# Patient Record
Sex: Female | Born: 1985 | Race: White | Hispanic: No | Marital: Single | Smoking: Current every day smoker
Health system: Southern US, Community
[De-identification: ages and names within clinical notes are randomized; demographics above are authoritative.]

## PROBLEM LIST (undated history)

## (undated) ENCOUNTER — Ambulatory Visit: Admission: EM | Payer: Medicaid Other | Source: Home / Self Care

## (undated) DIAGNOSIS — K8020 Calculus of gallbladder without cholecystitis without obstruction: Secondary | ICD-10-CM

## (undated) DIAGNOSIS — F1910 Other psychoactive substance abuse, uncomplicated: Secondary | ICD-10-CM

## (undated) DIAGNOSIS — F419 Anxiety disorder, unspecified: Secondary | ICD-10-CM

## (undated) DIAGNOSIS — E119 Type 2 diabetes mellitus without complications: Secondary | ICD-10-CM

## (undated) DIAGNOSIS — F32A Depression, unspecified: Secondary | ICD-10-CM

## (undated) DIAGNOSIS — R002 Palpitations: Secondary | ICD-10-CM

## (undated) DIAGNOSIS — K219 Gastro-esophageal reflux disease without esophagitis: Secondary | ICD-10-CM

## (undated) DIAGNOSIS — F1010 Alcohol abuse, uncomplicated: Secondary | ICD-10-CM

## (undated) DIAGNOSIS — I517 Cardiomegaly: Secondary | ICD-10-CM

## (undated) DIAGNOSIS — B1920 Unspecified viral hepatitis C without hepatic coma: Secondary | ICD-10-CM

## (undated) DIAGNOSIS — F329 Major depressive disorder, single episode, unspecified: Secondary | ICD-10-CM

## (undated) DIAGNOSIS — K863 Pseudocyst of pancreas: Secondary | ICD-10-CM

## (undated) HISTORY — DX: Type 2 diabetes mellitus without complications: E11.9

## (undated) HISTORY — DX: Anxiety disorder, unspecified: F41.9

## (undated) HISTORY — DX: Cardiomegaly: I51.7

## (undated) HISTORY — DX: Calculus of gallbladder without cholecystitis without obstruction: K80.20

## (undated) HISTORY — DX: Depression, unspecified: F32.A

## (undated) HISTORY — PX: VASCULAR SURGERY: SHX849

## (undated) HISTORY — PX: CHOLECYSTECTOMY: SHX55

## (undated) HISTORY — DX: Major depressive disorder, single episode, unspecified: F32.9

## (undated) HISTORY — DX: Other psychoactive substance abuse, uncomplicated: F19.10

## (undated) HISTORY — DX: Pseudocyst of pancreas: K86.3

## (undated) HISTORY — DX: Alcohol abuse, uncomplicated: F10.10

## (undated) HISTORY — DX: Morbid (severe) obesity due to excess calories: E66.01

---

## 2005-08-19 ENCOUNTER — Emergency Department: Payer: Self-pay | Admitting: General Practice

## 2006-12-15 ENCOUNTER — Emergency Department: Payer: Self-pay | Admitting: Emergency Medicine

## 2007-02-08 ENCOUNTER — Emergency Department: Payer: Self-pay | Admitting: Emergency Medicine

## 2007-02-09 ENCOUNTER — Emergency Department: Payer: Self-pay | Admitting: Emergency Medicine

## 2007-06-14 ENCOUNTER — Emergency Department: Payer: Self-pay | Admitting: Emergency Medicine

## 2008-02-13 ENCOUNTER — Emergency Department: Payer: Self-pay | Admitting: Emergency Medicine

## 2009-02-10 ENCOUNTER — Emergency Department: Payer: Self-pay | Admitting: Emergency Medicine

## 2009-07-07 DIAGNOSIS — B182 Chronic viral hepatitis C: Secondary | ICD-10-CM

## 2009-12-07 DIAGNOSIS — K8020 Calculus of gallbladder without cholecystitis without obstruction: Secondary | ICD-10-CM

## 2009-12-07 HISTORY — DX: Calculus of gallbladder without cholecystitis without obstruction: K80.20

## 2014-10-15 LAB — HEPATIC FUNCTION PANEL
ALT: 17 U/L (ref 7–35)
AST: 22 U/L (ref 13–35)
Alkaline Phosphatase: 70 U/L (ref 25–125)
Bilirubin, Total: 0.6 mg/dL

## 2014-10-15 LAB — BASIC METABOLIC PANEL
BUN: 8 mg/dL (ref 4–21)
Creatinine: 0.7 mg/dL (ref 0.5–1.1)
Glucose: 90 mg/dL
POTASSIUM: 4.2 mmol/L (ref 3.4–5.3)
SODIUM: 138 mmol/L (ref 137–147)

## 2014-10-15 LAB — LIPID PANEL
CHOLESTEROL: 171 mg/dL (ref 0–200)
HDL: 30 mg/dL — AB (ref 35–70)
LDL CALC: 84 mg/dL
Triglycerides: 286 mg/dL — AB (ref 40–160)

## 2014-10-15 LAB — CBC AND DIFFERENTIAL
HEMATOCRIT: 44 % (ref 36–46)
HEMOGLOBIN: 14.5 g/dL (ref 12.0–16.0)
PLATELETS: 241 10*3/uL (ref 150–399)
WBC: 8.8 10^3/mL

## 2014-10-15 LAB — HM PAP SMEAR: HM Pap smear: NEGATIVE

## 2014-10-15 LAB — TSH: TSH: 1.16 u[IU]/mL (ref <=5.90)

## 2014-12-18 DIAGNOSIS — Y998 Other external cause status: Secondary | ICD-10-CM | POA: Diagnosis not present

## 2014-12-18 DIAGNOSIS — Y92410 Unspecified street and highway as the place of occurrence of the external cause: Secondary | ICD-10-CM | POA: Diagnosis not present

## 2014-12-18 DIAGNOSIS — Z720 Tobacco use: Secondary | ICD-10-CM | POA: Insufficient documentation

## 2014-12-18 DIAGNOSIS — Z79899 Other long term (current) drug therapy: Secondary | ICD-10-CM | POA: Insufficient documentation

## 2014-12-18 DIAGNOSIS — Y9389 Activity, other specified: Secondary | ICD-10-CM | POA: Insufficient documentation

## 2014-12-18 DIAGNOSIS — S40012A Contusion of left shoulder, initial encounter: Secondary | ICD-10-CM | POA: Insufficient documentation

## 2014-12-18 DIAGNOSIS — Z880 Allergy status to penicillin: Secondary | ICD-10-CM | POA: Insufficient documentation

## 2014-12-18 DIAGNOSIS — S20229A Contusion of unspecified back wall of thorax, initial encounter: Secondary | ICD-10-CM | POA: Diagnosis not present

## 2014-12-18 DIAGNOSIS — S29012A Strain of muscle and tendon of back wall of thorax, initial encounter: Secondary | ICD-10-CM | POA: Insufficient documentation

## 2014-12-18 DIAGNOSIS — S299XXA Unspecified injury of thorax, initial encounter: Secondary | ICD-10-CM | POA: Diagnosis present

## 2014-12-19 ENCOUNTER — Encounter: Payer: Self-pay | Admitting: *Deleted

## 2014-12-19 ENCOUNTER — Emergency Department
Admission: EM | Admit: 2014-12-19 | Discharge: 2014-12-19 | Disposition: A | Discharge status: Home / Self Care | Payer: Medicaid Other | Attending: Emergency Medicine | Admitting: Emergency Medicine

## 2014-12-19 ENCOUNTER — Emergency Department: Payer: Medicaid Other

## 2014-12-19 DIAGNOSIS — R0789 Other chest pain: Secondary | ICD-10-CM

## 2014-12-19 DIAGNOSIS — S29019A Strain of muscle and tendon of unspecified wall of thorax, initial encounter: Secondary | ICD-10-CM

## 2014-12-19 HISTORY — DX: Unspecified viral hepatitis C without hepatic coma: B19.20

## 2014-12-19 MED ORDER — DIAZEPAM 2 MG PO TABS: 2.0000 mg | ORAL_TABLET | Freq: Three times a day (TID) | ORAL | Status: DC | PRN

## 2014-12-19 MED ORDER — OXYCODONE-ACETAMINOPHEN 5-325 MG PO TABS
1.0000 | ORAL_TABLET | Freq: Once | ORAL | Status: AC
  Administered 2014-12-19: 1 via ORAL
  Filled 2014-12-19: qty 1

## 2014-12-19 MED ORDER — IBUPROFEN 800 MG PO TABS: 800.0000 mg | ORAL_TABLET | Freq: Three times a day (TID) | ORAL | Status: DC | PRN
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
## 2015-01-21 ENCOUNTER — Encounter: Payer: Self-pay | Admitting: Emergency Medicine

## 2015-01-21 ENCOUNTER — Ambulatory Visit
Admission: EM | Admit: 2015-01-21 | Discharge: 2015-01-21 | Disposition: A | Discharge status: Home / Self Care | Payer: Medicaid Other | Attending: Family Medicine | Admitting: Family Medicine

## 2015-01-21 DIAGNOSIS — J0100 Acute maxillary sinusitis, unspecified: Secondary | ICD-10-CM

## 2015-01-21 MED ORDER — DOXYCYCLINE HYCLATE 100 MG PO CAPS: 100.0000 mg | ORAL_CAPSULE | Freq: Two times a day (BID) | ORAL | Status: DC

## 2015-01-21 NOTE — ED Provider Notes (Signed)
CSN: [id number]     Arrival date & time 01/21/15  1704 History   First MD Initiated Contact with Patient 01/21/15 1726     Chief Complaint  Patient presents with  . Facial Pain   (Consider location/radiation/quality/duration/timing/severity/associated sxs/prior Treatment) HPI 29 year old female presents with complaints of facial pain, sinus congestion/chest congestion.  Patient reports that she has had the above symptoms for the past 2.5 weeks. She notes that her son has been sick as well. She reports associated cough which is mildly productive. No exacerbating or relieving factors. No associated fever, chills. She does note some SOB (she is a smoker).    Past Medical History  Diagnosis Date  . Hepatitis C    Past Surgical History  Procedure Laterality Date  . Cholecystectomy     No pertinent family history.  Social History  Substance Use Topics  . Smoking status: Current Every Day Smoker  . Smokeless tobacco: None  . Alcohol Use: No   OB History    No data available     Review of Systems  Constitutional: Negative for fever.  HENT: Positive for congestion, sinus pressure and sore throat.   Respiratory: Positive for cough.   All other systems negative.    Allergies  Penicillins  Home Medications   Prior to Admission medications   Medication Sig Start Date End Date Taking? Authorizing Provider  buPROPion (WELLBUTRIN) 100 MG tablet Take 200 mg by mouth daily.    Historical Provider, MD  diazepam (VALIUM) 2 MG tablet Take 1 tablet (2 mg total) by mouth every 8 (eight) hours as needed for anxiety or muscle spasms. 12/19/14   Ahmed Prima, MD  ibuprofen (ADVIL,MOTRIN) 800 MG tablet Take 1 tablet (800 mg total) by mouth every 8 (eight) hours as needed. 12/19/14   Ahmed Prima, MD   Meds Ordered and Administered this Visit  Medications - No data to display  BP 103/52 mmHg  Pulse 85  Temp(Src) 97.1 F (36.2 C) (Tympanic)  Resp 16  Ht 5\' 3"  (1.6 m)  Wt 200 lb  (90.719 kg)  BMI 35.44 kg/m2  SpO2 98%  LMP 12/29/2014 No data found.  Physical Exam  Constitutional: She is oriented to person, place, and time. She appears well-developed and well-nourished.  Appears fatigued.   HENT:  Head: Normocephalic and atraumatic.  Mouth/Throat: Oropharynx is clear and moist.  Normal TM's bilaterally.  Maxillary sinuses mildly tender to palpation.  Neck: No tracheal deviation present.  Cardiovascular: Normal rate and regular rhythm.   No murmur heard. Pulmonary/Chest: Effort normal and breath sounds normal. No respiratory distress. She has no wheezes. She has no rales.  Abdominal: Soft. She exhibits no distension. There is no tenderness. There is no rebound.  Musculoskeletal: Normal range of motion.  Neurological: She is alert and oriented to person, place, and time.  Skin: Skin is warm and dry.  Psychiatric:  Flat affect.   Vitals reviewed.  ED Course  Procedures (including critical care time)  Labs Review Labs Reviewed - No data to display  Imaging Review No results found.  MDM   1. Acute maxillary sinusitis, recurrence not specified    29 year old female with acute sinusitis. Nontoxic appear, VSS. Treating with Doxycyline.  Follow up with PCP if symptoms worsen or fail to improve.     Coral Spikes, DO 01/21/15 1753

## 2015-01-21 NOTE — ED Notes (Signed)
Sinus, congestion, scratchy throat for 2 weeks

## 2015-01-21 NOTE — Discharge Instructions (Signed)

## 2015-02-25 DIAGNOSIS — F329 Major depressive disorder, single episode, unspecified: Secondary | ICD-10-CM | POA: Insufficient documentation

## 2015-02-25 DIAGNOSIS — F419 Anxiety disorder, unspecified: Secondary | ICD-10-CM | POA: Insufficient documentation

## 2015-02-25 DIAGNOSIS — F339 Major depressive disorder, recurrent, unspecified: Secondary | ICD-10-CM | POA: Insufficient documentation

## 2015-09-29 ENCOUNTER — Other Ambulatory Visit: Payer: Self-pay

## 2015-09-30 ENCOUNTER — Encounter: Payer: Self-pay | Admitting: Gastroenterology

## 2015-09-30 ENCOUNTER — Encounter (INDEPENDENT_AMBULATORY_CARE_PROVIDER_SITE_OTHER): Payer: Self-pay

## 2015-09-30 ENCOUNTER — Ambulatory Visit (INDEPENDENT_AMBULATORY_CARE_PROVIDER_SITE_OTHER): Payer: Medicaid Other | Admitting: Gastroenterology

## 2015-09-30 VITALS — BP 137/69 | HR 88 | Temp 98.7°F | Ht 63.0 in | Wt 210.0 lb

## 2015-09-30 DIAGNOSIS — B182 Chronic viral hepatitis C: Secondary | ICD-10-CM

## 2015-09-30 NOTE — Progress Notes (Signed)
Gastroenterology Consultation  Referring Provider:     [facility] [place] Primary Primary Care Physician:  [facility] PR[facility] Primary Gastroenterologist:  Dr. Allen Norris     Reason for Consultation:     Hepatitis C        HPI:   Katie Stanley is a 30 y.o. y/o female referred for consultation & management of Hepatitis C by Dr. Shari Prows PRIMARY CARE.  This patient comes today with a report of being told she had hepatitis C.  Her boyfriend is being treated at the present time by me for his hepatitis C.  The patient has not used any IV drugs for the last 4 years.  She also had blood work done late 2016 that showed her viral load to be negative.  She denies any new high risk activities except sexual contact with her boyfriend who is hepatitis C positive.  There is no report of any nausea vomiting fevers chills black stools or bloody stools.  Past Medical History  Diagnosis Date  . Hepatitis C   . Anxiety   . Depression   . Cholelithiasis   . Substance abuse     Past Surgical History  Procedure Laterality Date  . Cholecystectomy      Prior to Admission medications   Medication Sig Start Date End Date Taking? Authorizing Provider  citalopram (CELEXA) 20 MG tablet Take 20 mg by mouth. 07/08/15 07/07/16 Yes Historical Provider, MD  clotrimazole (LOTRIMIN) 1 % cream Apply topically. 05/19/15 05/18/16 Yes Historical Provider, MD  cyclobenzaprine (FLEXERIL) 10 MG tablet  09/02/15  Yes Historical Provider, MD  ibuprofen (ADVIL,MOTRIN) 800 MG tablet Take 1 tablet (800 mg total) by mouth every 8 (eight) hours as needed. 12/19/14  Yes Ahmed Prima, MD  propranolol (INDERAL) 10 MG tablet  06/21/15  Yes Historical Provider, MD  buPROPion (WELLBUTRIN) 100 MG tablet Take 200 mg by mouth daily. Reported on 09/30/2015    Historical Provider, MD  diazepam (VALIUM) 2 MG tablet Take 1 tablet (2 mg total) by mouth every 8 (eight) hours as needed for anxiety or muscle spasms. Patient not taking: Reported on 09/30/2015  12/19/14   Ahmed Prima, MD  doxycycline (VIBRAMYCIN) 100 MG capsule Take 1 capsule (100 mg total) by mouth 2 (two) times daily. Patient not taking: Reported on 09/30/2015 01/21/15   Coral Spikes, DO    Family History  Problem Relation Age of Onset  . Hypertension Mother      Social History  Substance Use Topics  . Smoking status: Current Every Day Smoker  . Smokeless tobacco: Never Used  . Alcohol Use: No    Allergies as of 09/30/2015 - Review Complete 09/30/2015  Allergen Reaction Noted  . Penicillins Swelling 12/19/2014    Review of Systems:    All systems reviewed and negative except where noted in HPI.   Physical Exam:  BP 137/69 mmHg  Pulse 88  Temp(Src) 98.7 F (37.1 C) (Oral)  Ht 5\' 3"  (1.6 m)  Wt 210 lb (95.255 kg)  BMI 37.21 kg/m2 No LMP recorded. Psych:  Alert and cooperative. Normal mood and affect. General:   Alert,  Well-developed, well-nourished, pleasant and cooperative in NAD Head:  Normocephalic and atraumatic. Eyes:  Sclera clear, no icterus.   Conjunctiva pink. Ears:  Normal auditory acuity. Nose:  No deformity, discharge, or lesions. Mouth:  No deformity or lesions,oropharynx pink & moist. Neck:  Supple; no masses or thyromegaly. Lungs:  Respirations even and unlabored.  Clear throughout to  auscultation.   No wheezes, crackles, or rhonchi. No acute distress. Heart:  Regular rate and rhythm; no murmurs, clicks, rubs, or gallops. Abdomen:  Normal bowel sounds.  No bruits.  Soft, non-tender and non-distended without masses, hepatosplenomegaly or hernias noted.  No guarding or rebound tenderness.  Negative Carnett sign.   Rectal:  Deferred.  Msk:  Symmetrical without gross deformities.  Good, equal movement & strength bilaterally. Pulses:  Normal pulses noted. Extremities:  No clubbing or edema.  No cyanosis. Neurologic:  Alert and oriented x3;  grossly normal neurologically. Skin:  Intact without significant lesions or rashes.  No jaundice. Lymph  Nodes:  No significant cervical adenopathy. Psych:  Alert and cooperative. Normal mood and affect.  Imaging Studies: No results found.  Assessment and Plan:   Katie Stanley is a 30 y.o. y/o female Who comes in today with a history of having hepatitis C.  The patient's most recent viral load was negative.  The patient will have her antibody and viral load checked again.  She will also have her LFTs checked.  The patient has been explained the plan and agrees with it.   Note: This dictation was prepared with Dragon dictation along with smaller phrase technology. Any transcriptional errors that result from this process are unintentional.

## 2015-11-25 ENCOUNTER — Encounter: Payer: Self-pay | Admitting: Family Medicine

## 2015-11-25 ENCOUNTER — Ambulatory Visit (INDEPENDENT_AMBULATORY_CARE_PROVIDER_SITE_OTHER): Payer: Medicaid Other | Admitting: Family Medicine

## 2015-11-25 VITALS — BP 130/75 | HR 73 | Temp 98.0°F | Wt 211.0 lb

## 2015-11-25 DIAGNOSIS — F419 Anxiety disorder, unspecified: Secondary | ICD-10-CM | POA: Diagnosis not present

## 2015-11-25 DIAGNOSIS — L0390 Cellulitis, unspecified: Secondary | ICD-10-CM

## 2015-11-25 DIAGNOSIS — L559 Sunburn, unspecified: Secondary | ICD-10-CM

## 2015-11-25 MED ORDER — SILVER SULFADIAZINE 1 % EX CREA: 1.0000 "application " | TOPICAL_CREAM | Freq: Every day | CUTANEOUS | 1 refills | Status: DC

## 2015-11-25 MED ORDER — PREDNISONE 10 MG PO TABS: ORAL_TABLET | ORAL | 0 refills | Status: DC

## 2015-11-25 MED ORDER — SULFAMETHOXAZOLE-TRIMETHOPRIM 800-160 MG PO TABS: 1.0000 | ORAL_TABLET | Freq: Two times a day (BID) | ORAL | 0 refills | Status: DC
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
## 2015-11-25 NOTE — Assessment & Plan Note (Signed)
Not happy with her treatment. Not seeing psychiatry. Will get records from previous provider and psychiatrist and discuss further upon review.

## 2015-11-25 NOTE — Patient Instructions (Addendum)
Sunburn  Sunburn is skin damage from being out in the sun too long. If you have light or fair skin, you may get sunburned more easily. Getting sunburned over and over can cause wrinkles and dark spots on the skin (sun spots). It can also increase your chance of getting skin cancer. HOME CARE  Avoid being out in the sun until your sunburn is gone.  Take a cool bath to help lessen pain. Put a cold, damp washcloth on the sunburn to help lessen pain. Do not put ice on the sunburn.  Only take medicine as told by your doctor.  Use sunburn creams or gels on your skin but not on blisters.  Drink enough fluids to keep your pee (urine) clear or pale yellow.  Do not break blisters. If blisters break, your doctor may tell you to use a medicated cream on the area. To keep from getting sunburned:  Avoid the sun between 10:00 a.m. and 4:00 p.m. during the day.  Put sunscreen on 30 minutes before being in the sun.  Wear a hat, clothing, and sunglasses to protect against the sun.  Avoid medicines, herbs, and foods that make you more sensitive to sun.  Avoid tanning beds. GET HELP RIGHT AWAY IF:  You have a fever.  You have pain and medicine does not help.  You throw up (vomit) or have watery poop (diarrhea).  You feel like you will pass out (faint).  You have a headache and feel confused.  You have very bad blisters.  You have yellowish-white fluid (pus) coming from your blisters.  Your burn gets more painful and puffy (swollen). MAKE SURE YOU:  Understand these instructions.  Will watch your condition.  Will get help right away if you are not doing well or get worse.   This information is not intended to replace advice given to you by your health care provider. Make sure you discuss any questions you have with your health care provider.   Document Released: 12/07/2010 Document Revised: 07/22/2012 Document Reviewed: 09/28/2014 Elsevier Interactive Patient Education [employer].

## 2015-12-02 ENCOUNTER — Ambulatory Visit (INDEPENDENT_AMBULATORY_CARE_PROVIDER_SITE_OTHER): Payer: Medicaid Other | Admitting: Family Medicine

## 2015-12-02 ENCOUNTER — Other Ambulatory Visit: Payer: Self-pay | Admitting: Family Medicine

## 2015-12-02 ENCOUNTER — Encounter: Payer: Self-pay | Admitting: Family Medicine

## 2015-12-02 VITALS — BP 111/71 | HR 72 | Temp 98.4°F | Wt 210.0 lb

## 2015-12-02 DIAGNOSIS — D229 Melanocytic nevi, unspecified: Secondary | ICD-10-CM

## 2015-12-02 DIAGNOSIS — L559 Sunburn, unspecified: Secondary | ICD-10-CM

## 2015-12-02 DIAGNOSIS — M5441 Lumbago with sciatica, right side: Secondary | ICD-10-CM

## 2015-12-02 MED ORDER — CYCLOBENZAPRINE HCL 10 MG PO TABS: 10.0000 mg | ORAL_TABLET | Freq: Every day | ORAL | 1 refills | Status: DC

## 2015-12-02 MED ORDER — NAPROXEN 500 MG PO TABS: 500.0000 mg | ORAL_TABLET | Freq: Two times a day (BID) | ORAL | 1 refills | Status: DC
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
## 2015-12-02 NOTE — Patient Instructions (Addendum)
Sciatica With Rehab The sciatic nerve runs from the back down the leg and is responsible for sensation and control of the muscles in the back (posterior) side of the thigh, lower leg, and foot. Sciatica is a condition that is characterized by inflammation of this nerve.  SYMPTOMS   Signs of nerve damage, including numbness and/or weakness along the posterior side of the lower extremity.  Pain in the back of the thigh that may also travel down the leg.  Pain that worsens when sitting for long periods of time.  Occasionally, pain in the back or buttock. CAUSES  Inflammation of the sciatic nerve is the cause of sciatica. The inflammation is due to something irritating the nerve. Common sources of irritation include:  Sitting for long periods of time.  Direct trauma to the nerve.  Arthritis of the spine.  Herniated or ruptured disk.  Slipping of the vertebrae (spondylolisthesis).  Pressure from soft tissues, such as muscles or ligament-like tissue (fascia). RISK INCREASES WITH:  Sports that place pressure or stress on the spine (football or weightlifting).  Poor strength and flexibility.  Failure to warm up properly before activity.  Family history of low back pain or disk disorders.  Previous back injury or surgery.  Poor body mechanics, especially when lifting, or poor posture. PREVENTION   Warm up and stretch properly before activity.  Maintain physical fitness:  Strength, flexibility, and endurance.  Cardiovascular fitness.  Learn and use proper technique, especially with posture and lifting. When possible, have coach correct improper technique.  Avoid activities that place stress on the spine. PROGNOSIS If treated properly, then sciatica usually resolves within 6 weeks. However, occasionally surgery is necessary.  RELATED COMPLICATIONS   Permanent nerve damage, including pain, numbness, tingle, or weakness.  Chronic back pain.  Risks of surgery: infection,  bleeding, nerve damage, or damage to surrounding tissues. TREATMENT Treatment initially involves resting from any activities that aggravate your symptoms. The use of ice and medication may help reduce pain and inflammation. The use of strengthening and stretching exercises may help reduce pain with activity. These exercises may be performed at home or with referral to a therapist. A therapist may recommend further treatments, such as transcutaneous electronic nerve stimulation (TENS) or ultrasound. Your caregiver may recommend corticosteroid injections to help reduce inflammation of the sciatic nerve. If symptoms persist despite non-surgical (conservative) treatment, then surgery may be recommended. MEDICATION  If pain medication is necessary, then nonsteroidal anti-inflammatory medications, such as aspirin and ibuprofen, or other minor pain relievers, such as acetaminophen, are often recommended.  Do not take pain medication for 7 days before surgery.  Prescription pain relievers may be given if deemed necessary by your caregiver. Use only as directed and only as much as you need.  Ointments applied to the skin may be helpful.  Corticosteroid injections may be given by your caregiver. These injections should be reserved for the most serious cases, because they may only be given a certain number of times. HEAT AND COLD  Cold treatment (icing) relieves pain and reduces inflammation. Cold treatment should be applied for 10 to 15 minutes every 2 to 3 hours for inflammation and pain and immediately after any activity that aggravates your symptoms. Use ice packs or massage the area with a piece of ice (ice massage).  Heat treatment may be used prior to performing the stretching and strengthening activities prescribed by your caregiver, physical therapist, or athletic trainer. Use a heat pack or soak the injury in warm water.   SEEK MEDICAL CARE IF:  Treatment seems to offer no benefit, or the condition  worsens.  Any medications produce adverse side effects. EXERCISES  RANGE OF MOTION (ROM) AND STRETCHING EXERCISES - Sciatica Most people with sciatic will find that their symptoms worsen with either excessive bending forward (flexion) or arching at the low back (extension). The exercises which will help resolve your symptoms will focus on the opposite motion. Your physician, physical therapist or athletic trainer will help you determine which exercises will be most helpful to resolve your low back pain. Do not complete any exercises without first consulting with your clinician. Discontinue any exercises which worsen your symptoms until you speak to your clinician. If you have pain, numbness or tingling which travels down into your buttocks, leg or foot, the goal of the therapy is for these symptoms to move closer to your back and eventually resolve. Occasionally, these leg symptoms will get better, but your low back pain may worsen; this is typically an indication of progress in your rehabilitation. Be certain to be very alert to any changes in your symptoms and the activities in which you participated in the 24 hours prior to the change. Sharing this information with your clinician will allow him/her to most efficiently treat your condition. These exercises may help you when beginning to rehabilitate your injury. Your symptoms may resolve with or without further involvement from your physician, physical therapist or athletic trainer. While completing these exercises, remember:   Restoring tissue flexibility helps normal motion to return to the joints. This allows healthier, less painful movement and activity.  An effective stretch should be held for at least 30 seconds.  A stretch should never be painful. You should only feel a gentle lengthening or release in the stretched tissue. FLEXION RANGE OF MOTION AND STRETCHING EXERCISES: STRETCH - Flexion, Single Knee to Chest   Lie on a firm bed or floor  with both legs extended in front of you.  Keeping one leg in contact with the floor, bring your opposite knee to your chest. Hold your leg in place by either grabbing behind your thigh or at your knee.  Pull until you feel a gentle stretch in your low back. Hold __________ seconds.  Slowly release your grasp and repeat the exercise with the opposite side. Repeat __________ times. Complete this exercise __________ times per day.  STRETCH - Flexion, Double Knee to Chest  Lie on a firm bed or floor with both legs extended in front of you.  Keeping one leg in contact with the floor, bring your opposite knee to your chest.  Tense your stomach muscles to support your back and then lift your other knee to your chest. Hold your legs in place by either grabbing behind your thighs or at your knees.  Pull both knees toward your chest until you feel a gentle stretch in your low back. Hold __________ seconds.  Tense your stomach muscles and slowly return one leg at a time to the floor. Repeat __________ times. Complete this exercise __________ times per day.  STRETCH - Low Trunk Rotation   Lie on a firm bed or floor. Keeping your legs in front of you, bend your knees so they are both pointed toward the ceiling and your feet are flat on the floor.  Extend your arms out to the side. This will stabilize your upper body by keeping your shoulders in contact with the floor.  Gently and slowly drop both knees together to one side until   you feel a gentle stretch in your low back. Hold for __________ seconds.  Tense your stomach muscles to support your low back as you bring your knees back to the starting position. Repeat the exercise to the other side. Repeat __________ times. Complete this exercise __________ times per day  EXTENSION RANGE OF MOTION AND FLEXIBILITY EXERCISES: STRETCH - Extension, Prone on Elbows  Lie on your stomach on the floor, a bed will be too soft. Place your palms about shoulder  width apart and at the height of your head.  Place your elbows under your shoulders. If this is too painful, stack pillows under your chest.  Allow your body to relax so that your hips drop lower and make contact more completely with the floor.  Hold this position for __________ seconds.  Slowly return to lying flat on the floor. Repeat __________ times. Complete this exercise __________ times per day.  RANGE OF MOTION - Extension, Prone Press Ups  Lie on your stomach on the floor, a bed will be too soft. Place your palms about shoulder width apart and at the height of your head.  Keeping your back as relaxed as possible, slowly straighten your elbows while keeping your hips on the floor. You may adjust the placement of your hands to maximize your comfort. As you gain motion, your hands will come more underneath your shoulders.  Hold this position __________ seconds.  Slowly return to lying flat on the floor. Repeat __________ times. Complete this exercise __________ times per day.  STRENGTHENING EXERCISES - Sciatica  These exercises may help you when beginning to rehabilitate your injury. These exercises should be done near your "sweet spot." This is the neutral, low-back arch, somewhere between fully rounded and fully arched, that is your least painful position. When performed in this safe range of motion, these exercises can be used for people who have either a flexion or extension based injury. These exercises may resolve your symptoms with or without further involvement from your physician, physical therapist or athletic trainer. While completing these exercises, remember:   Muscles can gain both the endurance and the strength needed for everyday activities through controlled exercises.  Complete these exercises as instructed by your physician, physical therapist or athletic trainer. Progress with the resistance and repetition exercises only as your caregiver advises.  You may  experience muscle soreness or fatigue, but the pain or discomfort you are trying to eliminate should never worsen during these exercises. If this pain does worsen, stop and make certain you are following the directions exactly. If the pain is still present after adjustments, discontinue the exercise until you can discuss the trouble with your clinician. STRENGTHENING - Deep Abdominals, Pelvic Tilt   Lie on a firm bed or floor. Keeping your legs in front of you, bend your knees so they are both pointed toward the ceiling and your feet are flat on the floor.  Tense your lower abdominal muscles to press your low back into the floor. This motion will rotate your pelvis so that your tail bone is scooping upwards rather than pointing at your feet or into the floor.  With a gentle tension and even breathing, hold this position for __________ seconds. Repeat __________ times. Complete this exercise __________ times per day.  STRENGTHENING - Abdominals, Crunches   Lie on a firm bed or floor. Keeping your legs in front of you, bend your knees so they are both pointed toward the ceiling and your feet are flat on the   floor. Cross your arms over your chest.  Slightly tip your chin down without bending your neck.  Tense your abdominals and slowly lift your trunk high enough to just clear your shoulder blades. Lifting higher can put excessive stress on the low back and does not further strengthen your abdominal muscles.  Control your return to the starting position. Repeat __________ times. Complete this exercise __________ times per day.  STRENGTHENING - Quadruped, Opposite UE/LE Lift  Assume a hands and knees position on a firm surface. Keep your hands under your shoulders and your knees under your hips. You may place padding under your knees for comfort.  Find your neutral spine and gently tense your abdominal muscles so that you can maintain this position. Your shoulders and hips should form a rectangle  that is parallel with the floor and is not twisted.  Keeping your trunk steady, lift your right hand no higher than your shoulder and then your left leg no higher than your hip. Make sure you are not holding your breath. Hold this position __________ seconds.  Continuing to keep your abdominal muscles tense and your back steady, slowly return to your starting position. Repeat with the opposite arm and leg. Repeat __________ times. Complete this exercise __________ times per day.  STRENGTHENING - Abdominals and Quadriceps, Straight Leg Raise   Lie on a firm bed or floor with both legs extended in front of you.  Keeping one leg in contact with the floor, bend the other knee so that your foot can rest flat on the floor.  Find your neutral spine, and tense your abdominal muscles to maintain your spinal position throughout the exercise.  Slowly lift your straight leg off the floor about 6 inches for a count of 15, making sure to not hold your breath.  Still keeping your neutral spine, slowly lower your leg all the way to the floor. Repeat this exercise with each leg __________ times. Complete this exercise __________ times per day. POSTURE AND BODY MECHANICS CONSIDERATIONS - Sciatica Keeping correct posture when sitting, standing or completing your activities will reduce the stress put on different body tissues, allowing injured tissues a chance to heal and limiting painful experiences. The following are general guidelines for improved posture. Your physician or physical therapist will provide you with any instructions specific to your needs. While reading these guidelines, remember:  The exercises prescribed by your provider will help you have the flexibility and strength to maintain correct postures.  The correct posture provides the optimal environment for your joints to work. All of your joints have less wear and tear when properly supported by a spine with good posture. This means you will  experience a healthier, less painful body.  Correct posture must be practiced with all of your activities, especially prolonged sitting and standing. Correct posture is as important when doing repetitive low-stress activities (typing) as it is when doing a single heavy-load activity (lifting). RESTING POSITIONS Consider which positions are most painful for you when choosing a resting position. If you have pain with flexion-based activities (sitting, bending, stooping, squatting), choose a position that allows you to rest in a less flexed posture. You would want to avoid curling into a fetal position on your side. If your pain worsens with extension-based activities (prolonged standing, working overhead), avoid resting in an extended position such as sleeping on your stomach. Most people will find more comfort when they rest with their spine in a more neutral position, neither too rounded nor too   arched. Lying on a non-sagging bed on your side with a pillow between your knees, or on your back with a pillow under your knees will often provide some relief. Keep in mind, being in any one position for a prolonged period of time, no matter how correct your posture, can still lead to stiffness. PROPER SITTING POSTURE In order to minimize stress and discomfort on your spine, you must sit with correct posture Sitting with good posture should be effortless for a healthy body. Returning to good posture is a gradual process. Many people can work toward this most comfortably by using various supports until they have the flexibility and strength to maintain this posture on their own. When sitting with proper posture, your ears will fall over your shoulders and your shoulders will fall over your hips. You should use the back of the chair to support your upper back. Your low back will be in a neutral position, just slightly arched. You may place a small pillow or folded towel at the base of your low back for support.  When  working at a desk, create an environment that supports good, upright posture. Without extra support, muscles fatigue and lead to excessive strain on joints and other tissues. Keep these recommendations in mind: CHAIR:   A chair should be able to slide under your desk when your back makes contact with the back of the chair. This allows you to work closely.  The chair's height should allow your eyes to be level with the upper part of your monitor and your hands to be slightly lower than your elbows. BODY POSITION  Your feet should make contact with the floor. If this is not possible, use a foot rest.  Keep your ears over your shoulders. This will reduce stress on your neck and low back. INCORRECT SITTING POSTURES   If you are feeling tired and unable to assume a healthy sitting posture, do not slouch or slump. This puts excessive strain on your back tissues, causing more damage and pain. Healthier options include:  Using more support, like a lumbar pillow.  Switching tasks to something that requires you to be upright or walking.  Talking a brief walk.  Lying down to rest in a neutral-spine position. PROLONGED STANDING WHILE SLIGHTLY LEANING FORWARD  When completing a task that requires you to lean forward while standing in one place for a long time, place either foot up on a stationary 2-4 inch high object to help maintain the best posture. When both feet are on the ground, the low back tends to lose its slight inward curve. If this curve flattens (or becomes too large), then the back and your other joints will experience too much stress, fatigue more quickly and can cause pain.  CORRECT STANDING POSTURES Proper standing posture should be assumed with all daily activities, even if they only take a few moments, like when brushing your teeth. As in sitting, your ears should fall over your shoulders and your shoulders should fall over your hips. You should keep a slight tension in your abdominal  muscles to brace your spine. Your tailbone should point down to the ground, not behind your body, resulting in an over-extended swayback posture.  INCORRECT STANDING POSTURES  Common incorrect standing postures include a forward head, locked knees and/or an excessive swayback. WALKING Walk with an upright posture. Your ears, shoulders and hips should all line-up. PROLONGED ACTIVITY IN A FLEXED POSITION When completing a task that requires you to bend forward   at your waist or lean over a low surface, try to find a way to stabilize 3 of 4 of your limbs. You can place a hand or elbow on your thigh or rest a knee on the surface you are reaching across. This will provide you more stability so that your muscles do not fatigue as quickly. By keeping your knees relaxed, or slightly bent, you will also reduce stress across your low back. CORRECT LIFTING TECHNIQUES DO :   Assume a wide stance. This will provide you more stability and the opportunity to get as close as possible to the object which you are lifting.  Tense your abdominals to brace your spine; then bend at the knees and hips. Keeping your back locked in a neutral-spine position, lift using your leg muscles. Lift with your legs, keeping your back straight.  Test the weight of unknown objects before attempting to lift them.  Try to keep your elbows locked down at your sides in order get the best strength from your shoulders when carrying an object.  Always ask for help when lifting heavy or awkward objects. INCORRECT LIFTING TECHNIQUES DO NOT:   Lock your knees when lifting, even if it is a small object.  Bend and twist. Pivot at your feet or move your feet when needing to change directions.  Assume that you cannot safely pick up a paperclip without proper posture.   This information is not intended to replace advice given to you by your health care provider. Make sure you discuss any questions you have with your health care provider.     Document Released: 03/27/2005 Document Revised: 08/11/2014 Document Reviewed: 07/09/2008 [employer] Interactive Patient Education 2016 [employer]  

## 2015-12-09 ENCOUNTER — Ambulatory Visit
Admission: EM | Admit: 2015-12-09 | Discharge: 2015-12-09 | Disposition: A | Discharge status: Home / Self Care | Payer: Medicaid Other | Attending: Family Medicine | Admitting: Family Medicine

## 2015-12-09 ENCOUNTER — Encounter: Payer: Self-pay | Admitting: *Deleted

## 2015-12-09 DIAGNOSIS — Z880 Allergy status to penicillin: Secondary | ICD-10-CM | POA: Insufficient documentation

## 2015-12-09 DIAGNOSIS — X58XXXA Exposure to other specified factors, initial encounter: Secondary | ICD-10-CM | POA: Insufficient documentation

## 2015-12-09 DIAGNOSIS — F17210 Nicotine dependence, cigarettes, uncomplicated: Secondary | ICD-10-CM | POA: Diagnosis not present

## 2015-12-09 DIAGNOSIS — S39012A Strain of muscle, fascia and tendon of lower back, initial encounter: Secondary | ICD-10-CM | POA: Insufficient documentation

## 2015-12-09 DIAGNOSIS — M549 Dorsalgia, unspecified: Secondary | ICD-10-CM | POA: Diagnosis present

## 2015-12-09 DIAGNOSIS — R350 Frequency of micturition: Secondary | ICD-10-CM | POA: Diagnosis present

## 2015-12-09 LAB — URINALYSIS COMPLETE WITH MICROSCOPIC (ARMC ONLY)
BILIRUBIN URINE: NEGATIVE
GLUCOSE, UA: NEGATIVE mg/dL
HGB URINE DIPSTICK: NEGATIVE
Ketones, ur: NEGATIVE mg/dL
LEUKOCYTES UA: NEGATIVE
NITRITE: NEGATIVE
Protein, ur: NEGATIVE mg/dL
RBC / HPF: NONE SEEN RBC/hpf (ref 0–5)
Specific Gravity, Urine: 1.02 (ref 1.005–1.030)
pH: 7 (ref 5.0–8.0)

## 2015-12-09 MED ORDER — KETOROLAC TROMETHAMINE 60 MG/2ML IM SOLN
60.0000 mg | Freq: Once | INTRAMUSCULAR | Status: AC
  Administered 2015-12-09: 60 mg via INTRAMUSCULAR

## 2015-12-09 MED ORDER — NAPROXEN 500 MG PO TABS: 500.0000 mg | ORAL_TABLET | Freq: Two times a day (BID) | ORAL | 0 refills | Status: DC

## 2015-12-09 MED ORDER — TIZANIDINE HCL 4 MG PO TABS: 4.0000 mg | ORAL_TABLET | Freq: Four times a day (QID) | ORAL | 0 refills | Status: DC | PRN

## 2015-12-09 NOTE — ED Provider Notes (Signed)
CSN: [id number]     Arrival date & time 12/09/15  1826 History   First MD Initiated Contact with Patient 12/09/15 2006     Chief Complaint  Patient presents with  . Back Pain  . Flank Pain  . Urinary Frequency  . Urinary Urgency   (Consider location/radiation/quality/duration/timing/severity/associated sxs/prior Treatment) HPI  30 year old female who presents with a four-day history of bilateral low back pain that is nonradiating. She has a having some urinary urgency and frequency as well. He just finished a course of prednisone and antibiotics for second-degree sunburn that [place] at the beach. She can return to work last week at [employer] where she is required to do a lot of lifting twisting and bending. He states that the pain is currently a 7 out of 10. She has no history of kidney stones in the past. She denies any vaginal discharge For previous records says she was recently seen by her primary care physician for the same symptoms diagnosed with lumbar strain with sciatica and been placed on Naprosyn and Flexeril.   Past Medical History:  Diagnosis Date  . Anxiety   . Cholelithiasis   . Depression   . Hepatitis C   . Substance abuse    Past Surgical History:  Procedure Laterality Date  . CHOLECYSTECTOMY     Family History  Problem Relation Age of Onset  . Hypertension Mother   . Diabetes Father   . Cancer Maternal Grandmother     Liver  . Hypertension Maternal Grandfather   . Heart disease Paternal Grandfather    Social History  Substance Use Topics  . Smoking status: Current Every Day Smoker    Packs/day: 0.50    Types: Cigarettes  . Smokeless tobacco: Never Used  . Alcohol use No   OB History    No data available     Review of Systems  Constitutional: Positive for activity change. Negative for appetite change, chills, fatigue and fever.  Genitourinary: Positive for frequency and urgency.  Musculoskeletal: Positive for back pain.  All other systems  reviewed and are negative.   Allergies  Penicillins  Home Medications   Prior to Admission medications   Medication Sig Start Date End Date Taking? Authorizing Provider  citalopram (CELEXA) 20 MG tablet Take 20 mg by mouth. 07/08/15 07/07/16 Yes Historical Provider, MD  PARAGARD INTRAUTERINE COPPER IU by Intrauterine route.   Yes Historical Provider, MD  propranolol (INDERAL) 10 MG tablet  06/21/15  Yes Historical Provider, MD  silver sulfADIAZINE (SILVADENE) 1 % cream Apply 1 application topically daily. 11/25/15  Yes Megan P Johnson, DO  sulfamethoxazole-trimethoprim (BACTRIM DS,SEPTRA DS) 800-160 MG tablet Take 1 tablet by mouth 2 (two) times daily. 11/25/15  Yes Megan P Johnson, DO  naproxen (NAPROSYN) 500 MG tablet Take 1 tablet (500 mg total) by mouth 2 (two) times daily with a meal. Don't start until you're done with the prednisone 12/02/15   Megan P Johnson, DO  naproxen (NAPROSYN) 500 MG tablet Take 1 tablet (500 mg total) by mouth 2 (two) times daily with a meal. 12/09/15   Lorin Picket, PA-C  predniSONE (DELTASONE) 10 MG tablet 6 tabs today and tomorrow, 5 tabs the next 2 days, decrease by 1 every other day until gone 11/25/15   Megan P Johnson, DO  tiZANidine (ZANAFLEX) 4 MG tablet Take 1 tablet (4 mg total) by mouth every 6 (six) hours as needed for muscle spasms. 12/09/15   Lorin Picket, PA-C  Meds Ordered and Administered this Visit   Medications  ketorolac (TORADOL) injection 60 mg (60 mg Intramuscular Given 12/09/15 2039)    BP 114/67 (BP Location: Left Arm)   Pulse 69   Temp 98.7 F (37.1 C) (Oral)   Resp 16   Ht 5\' 3"  (1.6 m)   Wt 210 lb (95.3 kg)   LMP 11/28/2015   SpO2 99%   BMI 37.20 kg/m  No data found.   Physical Exam  Constitutional: She is oriented to person, place, and time. She appears well-developed and well-nourished. No distress.  HENT:  Head: Normocephalic and atraumatic.  Eyes: EOM are normal. Pupils are equal, round, and reactive to light.   Neck: Normal range of motion. Neck supple.  Pulmonary/Chest: Effort normal and breath sounds normal. No respiratory distress. She has no wheezes. She has no rales.  Abdominal: Soft. Bowel sounds are normal. She exhibits no distension and no mass. There is no tenderness. There is no rebound and no guarding.  Is no CVA tenderness  Musculoskeletal: She exhibits tenderness.  Examination of lumbar spine shows a level pelvis in stance. States good range of motion of the lumbar spine complaints of discomfort with lateral flexion bilaterally and more discomfort with thoracic rotation.  Neurological: She is alert and oriented to person, place, and time.  Skin: Skin is warm and dry. She is not diaphoretic.  Psychiatric: She has a normal mood and affect. Her behavior is normal. Judgment and thought content normal.  Nursing note and vitals reviewed.   Urgent Care Course   Clinical Course    Procedures (including critical care time)  Labs Review Labs Reviewed  URINALYSIS COMPLETEWITH MICROSCOPIC ([facility] ONLY) - Abnormal; Notable for the following:       Result Value   Bacteria, UA FEW (*)    Squamous Epithelial / LPF 0-5 (*)    All other components within normal limits    Imaging Review No results found.   Visual Acuity Review  Right Eye Distance:   Left Eye Distance:   Bilateral Distance:    Right Eye Near:   Left Eye Near:    Bilateral Near:     Medications  ketorolac (TORADOL) injection 60 mg (60 mg Intramuscular Given 12/09/15 2039)      MDM   1. Lumbar strain, initial encounter    New Prescriptions   NAPROXEN (NAPROSYN) 500 MG TABLET    Take 1 tablet (500 mg total) by mouth 2 (two) times daily with a meal.   TIZANIDINE (ZANAFLEX) 4 MG TABLET    Take 1 tablet (4 mg total) by mouth every 6 (six) hours as needed for muscle spasms.  Plan: 1. Test/x-ray results and diagnosis reviewed with patient 2. rx as per orders; risks, benefits, potential side effects reviewed with  patient 3. Recommend supportive treatment with Rest and symptom avoidance. Heat or alternate with ice may be beneficial. She will return to her primary care physician next week. In reviewing her medical records she had been seen by her primary last week for the same complaints and diagnosed with a lumbar strain with sciatica. 4. F/u prn if symptoms worsen or don't improve     Lorin Picket, PA-C 12/09/15 2042

## 2015-12-09 NOTE — ED Triage Notes (Signed)
Bilat flank pain x4 days. Also has urinary urg/freq.

## 2015-12-16 ENCOUNTER — Other Ambulatory Visit: Payer: Self-pay | Admitting: Family Medicine

## 2015-12-16 ENCOUNTER — Encounter: Payer: Self-pay | Admitting: Family Medicine

## 2015-12-16 ENCOUNTER — Ambulatory Visit (INDEPENDENT_AMBULATORY_CARE_PROVIDER_SITE_OTHER): Payer: Medicaid Other | Admitting: Family Medicine

## 2015-12-16 VITALS — BP 105/72 | HR 71 | Temp 98.1°F | Wt 217.0 lb

## 2015-12-16 DIAGNOSIS — M545 Low back pain: Secondary | ICD-10-CM

## 2015-12-16 MED ORDER — TIZANIDINE HCL 4 MG PO TABS: 4.0000 mg | ORAL_TABLET | Freq: Four times a day (QID) | ORAL | 0 refills | Status: DC | PRN

## 2015-12-16 NOTE — Progress Notes (Signed)
BP 105/72   Pulse 71   Temp 98.1 F (36.7 C)   Wt 217 lb (98.4 kg)   LMP 11/16/2015 (Approximate)   SpO2 97%   BMI 38.44 kg/m    Subjective:    Patient ID: Katie Stanley, female    DOB: January 04, 1986, 30 y.o.   MRN: [id number]  HPI: Katie Stanley is a 30 y.o. female  Chief Complaint  Patient presents with  . Back Pain    low back pain worse for the last week, was twisting to "pop" her back and thinks she pulled or strained something. Went to UC and given meds, but no improvement yet.   Patient presents with several weeks of b/l low back pain. Has been seen several times since incident happened. States she was trying to pop her back and felt a sharp pain and now is having severe tenderness in b/l lumbar area with spasms. Zanaflex and naproxen help some. Completed a prednisone taper a week or so ago. Pain is sharp and stabbing, constant in low back b/l but resolves almost completely on naproxen. Right worse than left. Some intermittent radiation down right leg but nothing severe. No numbness or tingling. Denies fever, chills, bowel/bladder incontinence.   Relevant past medical, surgical, family and social history reviewed and updated as indicated. Interim medical history since our last visit reviewed. Allergies and medications reviewed and updated.  Review of Systems  Constitutional: Negative.   HENT: Negative.   Respiratory: Negative.   Cardiovascular: Negative.   Gastrointestinal: Negative.   Genitourinary: Negative.   Musculoskeletal: Positive for back pain.  Neurological: Negative.   Psychiatric/Behavioral: Negative.     Per HPI unless specifically indicated above     Objective:    BP 105/72   Pulse 71   Temp 98.1 F (36.7 C)   Wt 217 lb (98.4 kg)   LMP 11/16/2015 (Approximate)   SpO2 97%   BMI 38.44 kg/m   Wt Readings from Last 3 Encounters:  12/16/15 217 lb (98.4 kg)  12/09/15 210 lb (95.3 kg)  12/02/15 210 lb (95.3 kg)    Physical Exam    Constitutional: She is oriented to person, place, and time. She appears well-developed and well-nourished.  HENT:  Head: Atraumatic.  Eyes: Conjunctivae are normal. No scleral icterus.  Neck: Normal range of motion. Neck supple.  Cardiovascular: Normal rate and normal heart sounds.   Pulmonary/Chest: Effort normal. No respiratory distress.  Abdominal: Soft. Bowel sounds are normal.  Musculoskeletal: She exhibits tenderness (B/l lumbar paraspinal tenderness to palpation).  ROM generally intact, some pain with full flexion at hips - SLR  Neurological: She is alert and oriented to person, place, and time. She exhibits normal muscle tone. Coordination normal.  Skin: Skin is warm and dry. No erythema.  Psychiatric: She has a normal mood and affect. Her behavior is normal.      Assessment & Plan:   Problem List Items Addressed This Visit    None    Visit Diagnoses    Bilateral low back pain, with sciatica presence unspecified    -  Primary   Continue zanaflex and naproxen, recommended warm epsom salt baths, massage, heating pads, icy hot. PT referral placed for core strengthening   Relevant Medications   cyclobenzaprine (FLEXERIL) 10 MG tablet   tiZANidine (ZANAFLEX) 4 MG tablet   tiZANidine (ZANAFLEX) 4 MG tablet   Other Relevant Orders   Ambulatory referral to Physical Therapy       Follow up plan:  Return for As scheduled for physical exam.

## 2015-12-16 NOTE — Patient Instructions (Signed)
Follow up as scheduled.  

## 2015-12-22 ENCOUNTER — Ambulatory Visit: Payer: Medicaid Other | Attending: Family Medicine | Admitting: Physical Therapy

## 2015-12-23 ENCOUNTER — Encounter: Payer: Medicaid Other | Admitting: Family Medicine

## 2016-01-06 ENCOUNTER — Other Ambulatory Visit: Payer: Self-pay | Admitting: Family Medicine

## 2016-01-06 ENCOUNTER — Encounter: Payer: Self-pay | Admitting: Family Medicine

## 2016-01-06 ENCOUNTER — Ambulatory Visit (INDEPENDENT_AMBULATORY_CARE_PROVIDER_SITE_OTHER): Payer: Medicaid Other | Admitting: Family Medicine

## 2016-01-06 VITALS — BP 117/77 | HR 75 | Temp 98.8°F | Ht 62.3 in | Wt 205.8 lb

## 2016-01-06 DIAGNOSIS — B182 Chronic viral hepatitis C: Secondary | ICD-10-CM

## 2016-01-06 DIAGNOSIS — Z23 Encounter for immunization: Secondary | ICD-10-CM

## 2016-01-06 DIAGNOSIS — Z13220 Encounter for screening for lipoid disorders: Secondary | ICD-10-CM | POA: Diagnosis not present

## 2016-01-06 DIAGNOSIS — M545 Low back pain, unspecified: Secondary | ICD-10-CM

## 2016-01-06 DIAGNOSIS — Z0000 Encounter for general adult medical examination without abnormal findings: Secondary | ICD-10-CM

## 2016-01-06 DIAGNOSIS — E669 Obesity, unspecified: Secondary | ICD-10-CM

## 2016-01-06 DIAGNOSIS — F419 Anxiety disorder, unspecified: Secondary | ICD-10-CM

## 2016-01-06 DIAGNOSIS — K219 Gastro-esophageal reflux disease without esophagitis: Secondary | ICD-10-CM

## 2016-01-06 MED ORDER — TIZANIDINE HCL 4 MG PO TABS: 4.0000 mg | ORAL_TABLET | Freq: Four times a day (QID) | ORAL | 1 refills | Status: DC | PRN

## 2016-01-06 MED ORDER — OMEPRAZOLE 40 MG PO CPDR: 40.0000 mg | DELAYED_RELEASE_CAPSULE | Freq: Every day | ORAL | 0 refills | Status: DC

## 2016-01-06 MED ORDER — NAPROXEN 500 MG PO TABS: 500.0000 mg | ORAL_TABLET | Freq: Two times a day (BID) | ORAL | 1 refills | Status: DC

## 2016-01-06 NOTE — Assessment & Plan Note (Signed)
Stable. Off medicine. Will call with any concerns.

## 2016-01-06 NOTE — Progress Notes (Signed)
BP 117/77 (BP Location: Left Arm, Patient Position: Sitting, Cuff Size: Large)   Pulse 75   Temp 98.8 F (37.1 C)   Ht 5' 2.3" (1.582 m)   Wt 205 lb 12.8 oz (93.4 kg)   LMP 12/23/2015 (Approximate)   SpO2 98%   BMI 37.28 kg/m    Subjective:    Patient ID: Katie Stanley, female    DOB: June 21, 1985, 30 y.o.   MRN: [id number]  HPI: Katie Stanley is a 30 y.o. female presenting on 01/06/2016 for comprehensive medical examination. Current medical complaints include:  HEPATITIS C Duration since diagnosis: Dx'd 2009 Viral load:  undetected Hepatology evaluation:yes Liver biopsy:no  Cirrhosis: no Antiviral therapy:no Hepatocellular carcinoma screening: yes Esophageal varices screening/EGD: no Hepatitis A Vaccine: Up to Date Hepatitis B Vaccine: Up to Date Pneumovax Vaccine: Up to Date  BACK PAIN Duration: 1 month Mechanism of injury: lifting and twisting Location: TL junction and midline Onset: sudden Severity: mild, gets up to severe pain Quality: sharp Frequency: intermittent Radiation: none Aggravating factors: lifting, movement, walking, laying, bending and prolonged sitting Alleviating factors: aleve and zanaflex Status: stable Treatments attempted: rest, ibuprofen, aleve and HEP  Relief with NSAIDs?: significant Nighttime pain:  no Paresthesias / decreased sensation:  no Bowel / bladder incontinence:  no Fevers:  no Dysuria / urinary frequency:  no  Weaned herself off the celexa- feeling better off of it.   She currently lives with: fiance and his mom and her son Menopausal Symptoms: no  Depression Screen done today and results listed below:  Depression screen [facility] 2/9 12/02/2015  Decreased Interest 0  Down, Depressed, Hopeless 0  PHQ - 2 Score 0    Past Medical History:  Past Medical History:  Diagnosis Date  . Anxiety   . Cholelithiasis   . Depression   . Hepatitis C   . Substance abuse     Surgical History:  Past Surgical History:    Procedure Laterality Date  . CHOLECYSTECTOMY      Medications:  Current Outpatient Prescriptions on File Prior to Visit  Medication Sig  . PARAGARD INTRAUTERINE COPPER IU by Intrauterine route.  . propranolol (INDERAL) 10 MG tablet    No current facility-administered medications on file prior to visit.     Allergies:  Allergies  Allergen Reactions  . Penicillins Swelling    Social History:  Social History   Social History  . Marital status: Single    Spouse name: N/A  . Number of children: N/A  . Years of education: N/A   Occupational History  . Not on file.   Social History Main Topics  . Smoking status: Current Every Day Smoker    Packs/day: 0.50    Types: Cigarettes  . Smokeless tobacco: Never Used  . Alcohol use No  . Drug use:     Types: Marijuana  . Sexual activity: Yes    Birth control/ protection: Implant   Other Topics Concern  . Not on file   Social History Narrative  . No narrative on file   History  Smoking Status  . Current Every Day Smoker  . Packs/day: 0.50  . Types: Cigarettes  Smokeless Tobacco  . Never Used   History  Alcohol Use No    Family History:  Family History  Problem Relation Age of Onset  . Hypertension Mother   . Diabetes Father   . Cancer Maternal Grandmother     Liver  . Hypertension Maternal Grandfather   . Heart  disease Paternal Grandfather     Past medical history, surgical history, medications, allergies, family history and social history reviewed with patient today and changes made to appropriate areas of the chart.   Review of Systems  Constitutional: Negative.   HENT: Negative.   Eyes: Negative.   Respiratory: Negative.   Cardiovascular: Positive for palpitations and leg swelling (Only with sun burn). Negative for chest pain, orthopnea, claudication and PND.  Gastrointestinal: Positive for heartburn. Negative for abdominal pain, blood in stool, constipation, diarrhea, melena, nausea and vomiting.   Genitourinary: Negative.   Musculoskeletal: Positive for back pain. Negative for falls, joint pain, myalgias and neck pain.  Skin: Negative.        Got bit by something last night   Neurological: Negative.   Endo/Heme/Allergies: Negative.   Psychiatric/Behavioral: Negative.     All other ROS negative except what is listed above and in the HPI.      Objective:    BP 117/77 (BP Location: Left Arm, Patient Position: Sitting, Cuff Size: Large)   Pulse 75   Temp 98.8 F (37.1 C)   Ht 5' 2.3" (1.582 m)   Wt 205 lb 12.8 oz (93.4 kg)   LMP 12/23/2015 (Approximate)   SpO2 98%   BMI 37.28 kg/m   Wt Readings from Last 3 Encounters:  01/06/16 205 lb 12.8 oz (93.4 kg)  12/16/15 217 lb (98.4 kg)  12/09/15 210 lb (95.3 kg)    Physical Exam  Constitutional: She is oriented to person, place, and time. She appears well-developed and well-nourished. No distress.  HENT:  Head: Normocephalic and atraumatic.  Right Ear: Hearing, tympanic membrane, external ear and ear canal normal.  Left Ear: Hearing, tympanic membrane, external ear and ear canal normal.  Nose: Nose normal.  Mouth/Throat: Uvula is midline, oropharynx is clear and moist and mucous membranes are normal. No oropharyngeal exudate.  Eyes: Conjunctivae, EOM and lids are normal. Pupils are equal, round, and reactive to light. Right eye exhibits no discharge. Left eye exhibits no discharge. No scleral icterus.  Neck: Normal range of motion. Neck supple. No JVD present. No tracheal deviation present. No thyromegaly present.  Cardiovascular: Normal rate, regular rhythm, normal heart sounds and intact distal pulses.  Exam reveals no gallop and no friction rub.   No murmur heard. Pulmonary/Chest: Effort normal and breath sounds normal. No stridor. No respiratory distress. She has no wheezes. She has no rales. She exhibits no tenderness. Right breast exhibits no inverted nipple, no mass, no nipple discharge, no skin change and no  tenderness. Left breast exhibits no inverted nipple, no mass, no nipple discharge, no skin change and no tenderness. Breasts are symmetrical.  Abdominal: Soft. Bowel sounds are normal. She exhibits no distension and no mass. There is no tenderness. There is no rebound and no guarding.  Genitourinary:  Genitourinary Comments: Deferred with shared decision making  Lymphadenopathy:    She has no cervical adenopathy.  Neurological: She is alert and oriented to person, place, and time. She has normal reflexes. She displays normal reflexes. No cranial nerve deficit. She exhibits normal muscle tone. Coordination normal.  Skin: Skin is warm, dry and intact. No rash noted. She is not diaphoretic. No erythema. No pallor.  Psychiatric: She has a normal mood and affect. Her speech is normal and behavior is normal. Judgment and thought content normal. Cognition and memory are normal.  Nursing note and vitals reviewed. Back Exam:    Inspection:  Normal spinal curvature.  No deformity, ecchymosis, erythema,  or lesions     Palpation:     Midline spinal tenderness: yes thoracic      Paralumbar tenderness: yes bilateral     Parathoracic tenderness: yes bilateral     Buttocks tenderness: no     Range of Motion:      Flexion: Fingers to Knees     Extension:Decreased     Lateral bending:Normal    Rotation:Normal    Neuro Exam:Lower extremity DTRs normal & symmetric.  Strength and sensation intact.    Special Tests:      Straight leg raise:negative    Results for orders placed or performed during the hospital encounter of 12/09/15  Urinalysis complete, with microscopic  Result Value Ref Range   Color, Urine YELLOW YELLOW   APPearance CLEAR CLEAR   Glucose, UA NEGATIVE NEGATIVE mg/dL   Bilirubin Urine NEGATIVE NEGATIVE   Ketones, ur NEGATIVE NEGATIVE mg/dL   Specific Gravity, Urine 1.020 1.005 - 1.030   Hgb urine dipstick NEGATIVE NEGATIVE   pH 7.0 5.0 - 8.0   Protein, ur NEGATIVE NEGATIVE mg/dL    Nitrite NEGATIVE NEGATIVE   Leukocytes, UA NEGATIVE NEGATIVE   RBC / HPF NONE SEEN 0 - 5 RBC/hpf   WBC, UA 0-5 0 - 5 WBC/hpf   Bacteria, UA FEW (A) NONE SEEN   Squamous Epithelial / LPF 0-5 (A) NONE SEEN   Budding Yeast PRESENT       Assessment & Plan:   Problem List Items Addressed This Visit      Digestive   Chronic hepatitis C virus infection (HCC)    Last check, viral load was undetectable. Unable to draw today- labs to be checked next visit.       Relevant Orders   Hepatitis A vaccine adult IM   Hepatitis B vaccine adult IM   HCV RT-PCR, Quant (Non-Graph)     Other   Anxiety    Stable. Off medicine. Will call with any concerns.        Other Visit Diagnoses    Routine general medical examination at a health care facility    -  Primary   Vaccines updated. Pap up to date. Screening labs checked today. Continue diet and exercise. Continue to monitor.    Screening for cholesterol level       Labs checked today. Await results.    Obesity       Checking labs. Work on diet and exercise.    Immunization due       Flu, pneumovax, Hep B#3 and Hep A#2 given today.    Relevant Orders   Flu Vaccine QUAD 36+ mos PF IM (Fluarix & Fluzone Quad PF) (Completed)   Hepatitis A vaccine adult IM   Hepatitis B vaccine adult IM   Pneumococcal polysaccharide vaccine 23-valent greater than or equal to 2yo subcutaneous/IM   Gastroesophageal reflux disease, esophagitis presence not specified       Will start her on omeprazole for 1 month to see if resolves   Relevant Medications   omeprazole (PRILOSEC) 40 MG capsule   Bilateral low back pain without sciatica       Stressed exercises. Continue medication. Heat. Recheck 1 month. If not better, will repeat x-ray.   Relevant Medications   tiZANidine (ZANAFLEX) 4 MG tablet   naproxen (NAPROSYN) 500 MG tablet       Follow up plan: Return in about 4 weeks (around 02/03/2016) for Follow up back and GERD.   LABORATORY TESTING:  - Pap  smear: up to date  IMMUNIZATIONS:   - Tdap: Tetanus vaccination status reviewed: last tetanus booster within 10 years. - Influenza: Given today - Pneumovax: Administered today - Hep A: Given today - Hep B: Given today  PATIENT COUNSELING:   Advised to take 1 mg of folate supplement per day if capable of pregnancy.   Sexuality: Discussed sexually transmitted diseases, partner selection, use of condoms, avoidance of unintended pregnancy  and contraceptive alternatives.   Advised to avoid cigarette smoking.  I discussed with the patient that most people either abstain from alcohol or drink within safe limits (<=14/week and <=4 drinks/occasion for males, <=7/weeks and <= 3 drinks/occasion for females) and that the risk for alcohol disorders and other health effects rises proportionally with the number of drinks per week and how often a drinker exceeds daily limits.  Discussed cessation/primary prevention of drug use and availability of treatment for abuse.   Diet: Encouraged to adjust caloric intake to maintain  or achieve ideal body weight, to reduce intake of dietary saturated fat and total fat, to limit sodium intake by avoiding high sodium foods and not adding table salt, and to maintain adequate dietary potassium and calcium preferably from fresh fruits, vegetables, and low-fat dairy products.    stressed the importance of regular exercise  Injury prevention: Discussed safety belts, safety helmets, smoke detector, smoking near bedding or upholstery.   Dental health: Discussed importance of regular tooth brushing, flossing, and dental visits.    NEXT PREVENTATIVE PHYSICAL DUE IN 1 YEAR. Return in about 4 weeks (around 02/03/2016) for Follow up back and GERD.

## 2016-01-06 NOTE — Patient Instructions (Addendum)
Influenza (Flu) Vaccine (Inactivated or Recombinant):  1. Why get vaccinated? Influenza ("flu") is a contagious disease that spreads around the [place] every year, usually between October and May. Flu is caused by influenza viruses, and is spread mainly by coughing, sneezing, and close contact. Anyone can get flu. Flu strikes suddenly and can last several days. Symptoms vary by age, but can include:  fever/chills  sore throat  muscle aches  fatigue  cough  headache  runny or stuffy nose Flu can also lead to pneumonia and blood infections, and cause diarrhea and seizures in children. If you have a medical condition, such as heart or lung disease, flu can make it worse. Flu is more dangerous for some people. Infants and young children, people 65 years of age and older, pregnant women, and people with certain health conditions or a weakened immune system are at greatest risk. Each year thousands of people in the [place] die from flu, and many more are hospitalized. Flu vaccine can:  keep you from getting flu,  make flu less severe if you do get it, and  keep you from spreading flu to your family and other people. 2. Inactivated and recombinant flu vaccines A dose of flu vaccine is recommended every flu season. Children 6 months through 8 years of age may need two doses during the same flu season. Everyone else needs only one dose each flu season. Some inactivated flu vaccines contain a very small amount of a mercury-based preservative called thimerosal. Studies have not shown thimerosal in vaccines to be harmful, but flu vaccines that do not contain thimerosal are available. There is no live flu virus in flu shots. They cannot cause the flu. There are many flu viruses, and they are always changing. Each year a new flu vaccine is made to protect against three or four viruses that are likely to cause disease in the upcoming flu season. But even when the vaccine doesn't exactly  match these viruses, it may still provide some protection. Flu vaccine cannot prevent:  flu that is caused by a virus not covered by the vaccine, or  illnesses that look like flu but are not. It takes about 2 weeks for protection to develop after vaccination, and protection lasts through the flu season. 3. Some people should not get this vaccine Tell the person who is giving you the vaccine:  If you have any severe, life-threatening allergies. If you ever had a life-threatening allergic reaction after a dose of flu vaccine, or have a severe allergy to any part of this vaccine, you may be advised not to get vaccinated. Most, but not all, types of flu vaccine contain a small amount of egg protein.  If you ever had Guillain-Barre Syndrome (also called GBS). Some people with a history of GBS should not get this vaccine. This should be discussed with your doctor.  If you are not feeling well. It is usually okay to get flu vaccine when you have a mild illness, but you might be asked to come back when you feel better. 4. Risks of a vaccine reaction With any medicine, including vaccines, there is a chance of reactions. These are usually mild and go away on their own, but serious reactions are also possible. Most people who get a flu shot do not have any problems with it. Minor problems following a flu shot include:  soreness, redness, or swelling where the shot was given  hoarseness  sore, red or itchy eyes  cough    fever  aches  headache  itching  fatigue If these problems occur, they usually begin soon after the shot and last 1 or 2 days. More serious problems following a flu shot can include the following:  There may be a small increased risk of Guillain-Barre Syndrome (GBS) after inactivated flu vaccine. This risk has been estimated at 1 or 2 additional cases per million people vaccinated. This is much lower than the risk of severe complications from flu, which can be prevented by  flu vaccine.  Young children who get the flu shot along with pneumococcal vaccine (PCV13) and/or DTaP vaccine at the same time might be slightly more likely to have a seizure caused by fever. Ask your doctor for more information. Tell your doctor if a child who is getting flu vaccine has ever had a seizure. Problems that could happen after any injected vaccine:  People sometimes faint after a medical procedure, including vaccination. Sitting or lying down for about 15 minutes can help prevent fainting, and injuries caused by a fall. Tell your doctor if you feel dizzy, or have vision changes or ringing in the ears.  Some people get severe pain in the shoulder and have difficulty moving the arm where a shot was given. This happens very rarely.  Any medication can cause a severe allergic reaction. Such reactions from a vaccine are very rare, estimated at about 1 in a million doses, and would happen within a few minutes to a few hours after the vaccination. As with any medicine, there is a very remote chance of a vaccine causing a serious injury or death. The safety of vaccines is always being monitored. For more information, visit: www.cdc.gov/vaccinesafety/ 5. What if there is a serious reaction? What should I look for?  Look for anything that concerns you, such as signs of a severe allergic reaction, very high fever, or unusual behavior. Signs of a severe allergic reaction can include hives, swelling of the face and throat, difficulty breathing, a fast heartbeat, dizziness, and weakness. These would start a few minutes to a few hours after the vaccination. What should I do?  If you think it is a severe allergic reaction or other emergency that can't wait, call 9-1-1 and get the person to the nearest hospital. Otherwise, call your doctor.  Reactions should be reported to the Vaccine Adverse Event Reporting System (VAERS). Your doctor should file this report, or you can do it yourself through the  VAERS web site at www.vaers.hhs.gov, or by calling 1-800-822-7967. VAERS does not give medical advice. 6. The National Vaccine Injury Compensation Program The National Vaccine Injury Compensation Program (VICP) is a federal program that was created to compensate people who may have been injured by certain vaccines. Persons who believe they may have been injured by a vaccine can learn about the program and about filing a claim by calling 1-800-338-2382 or visiting the VICP website at www.hrsa.gov/vaccinecompensation. There is a time limit to file a claim for compensation. 7. How can I learn more?  Ask your healthcare provider. He or she can give you the vaccine package insert or suggest other sources of information.  Call your local or state health department.  Contact the Centers for Disease Control and Prevention (CDC):  Call 1-800-232-4636 (1-800-CDC-INFO) or  Visit CDC's website at www.cdc.gov/flu Vaccine Information Statement Inactivated Influenza Vaccine (11/14/2013)   This information is not intended to replace advice given to you by your health care provider. Make sure you discuss any questions you have with   your health care provider.   Document Released: 01/19/2006 Document Revised: 04/17/2014 Document Reviewed: 11/17/2013 Elsevier Interactive Patient Education 2016 [place] Maintenance, Female Adopting a healthy lifestyle and getting preventive care can go a long way to promote health and wellness. Talk with your health care provider about what schedule of regular examinations is right for you. This is a good chance for you to check in with your provider about disease prevention and staying healthy. In between checkups, there are plenty of things you can do on your own. Experts have done a lot of research about which lifestyle changes and preventive measures are most likely to keep you healthy. Ask your health care provider for more information. WEIGHT AND DIET  Eat a  healthy diet  Be sure to include plenty of vegetables, fruits, low-fat dairy products, and lean protein.  Do not eat a lot of foods high in solid fats, added sugars, or salt.  Get regular exercise. This is one of the most important things you can do for your health.  Most adults should exercise for at least 150 minutes each week. The exercise should increase your heart rate and make you sweat (moderate-intensity exercise).  Most adults should also do strengthening exercises at least twice a week. This is in addition to the moderate-intensity exercise.  Maintain a healthy weight  Body mass index (BMI) is a measurement that can be used to identify possible weight problems. It estimates body fat based on height and weight. Your health care provider can help determine your BMI and help you achieve or maintain a healthy weight.  For females 25 years of age and older:   A BMI below 18.5 is considered underweight.  A BMI of 18.5 to 24.9 is normal.  A BMI of 25 to 29.9 is considered overweight.  A BMI of 30 and above is considered obese.  Watch levels of cholesterol and blood lipids  You should start having your blood tested for lipids and cholesterol at 30 years of age, then have this test every 5 years.  You may need to have your cholesterol levels checked more often if:  Your lipid or cholesterol levels are high.  You are older than 30 years of age.  You are at high risk for heart disease.  CANCER SCREENING   Lung Cancer  Lung cancer screening is recommended for adults 53-45 years old who are at high risk for lung cancer because of a history of smoking.  A yearly low-dose CT scan of the lungs is recommended for people who:  Currently smoke.  Have quit within the past 15 years.  Have at least a 30-pack-year history of smoking. A pack year is smoking an average of one pack of cigarettes a day for 1 year.  Yearly screening should continue until it has been 15 years since  you quit.  Yearly screening should stop if you develop a health problem that would prevent you from having lung cancer treatment.  Breast Cancer  Practice breast self-awareness. This means understanding how your breasts normally appear and feel.  It also means doing regular breast self-exams. Let your health care provider know about any changes, no matter how small.  If you are in your 20s or 30s, you should have a clinical breast exam (CBE) by a health care provider every 1-3 years as part of a regular health exam.  If you are 51 or older, have a CBE every year. Also consider having a breast X-ray (mammogram) every  year.  If you have a family history of breast cancer, talk to your health care provider about genetic screening.  If you are at high risk for breast cancer, talk to your health care provider about having an MRI and a mammogram every year.  Breast cancer gene (BRCA) assessment is recommended for women who have family members with BRCA-related cancers. BRCA-related cancers include:  Breast.  Ovarian.  Tubal.  Peritoneal cancers.  Results of the assessment will determine the need for genetic counseling and BRCA1 and BRCA2 testing. Cervical Cancer Your health care provider may recommend that you be screened regularly for cancer of the pelvic organs (ovaries, uterus, and vagina). This screening involves a pelvic examination, including checking for microscopic changes to the surface of your cervix (Pap test). You may be encouraged to have this screening done every 3 years, beginning at age 48.  For women ages 14-65, health care providers may recommend pelvic exams and Pap testing every 3 years, or they may recommend the Pap and pelvic exam, combined with testing for human papilloma virus (HPV), every 5 years. Some types of HPV increase your risk of cervical cancer. Testing for HPV may also be done on women of any age with unclear Pap test results.  Other health care providers  may not recommend any screening for nonpregnant women who are considered low risk for pelvic cancer and who do not have symptoms. Ask your health care provider if a screening pelvic exam is right for you.  If you have had past treatment for cervical cancer or a condition that could lead to cancer, you need Pap tests and screening for cancer for at least 20 years after your treatment. If Pap tests have been discontinued, your risk factors (such as having a new sexual partner) need to be reassessed to determine if screening should resume. Some women have medical problems that increase the chance of getting cervical cancer. In these cases, your health care provider may recommend more frequent screening and Pap tests. Colorectal Cancer  This type of cancer can be detected and often prevented.  Routine colorectal cancer screening usually begins at 30 years of age and continues through 30 years of age.  Your health care provider may recommend screening at an earlier age if you have risk factors for colon cancer.  Your health care provider may also recommend using home test kits to check for hidden blood in the stool.  A small camera at the end of a tube can be used to examine your colon directly (sigmoidoscopy or colonoscopy). This is done to check for the earliest forms of colorectal cancer.  Routine screening usually begins at age 62.  Direct examination of the colon should be repeated every 5-10 years through 30 years of age. However, you may need to be screened more often if early forms of precancerous polyps or small growths are found. Skin Cancer  Check your skin from head to toe regularly.  Tell your health care provider about any new moles or changes in moles, especially if there is a change in a mole's shape or color.  Also tell your health care provider if you have a mole that is larger than the size of a pencil eraser.  Always use sunscreen. Apply sunscreen liberally and repeatedly  throughout the day.  Protect yourself by wearing long sleeves, pants, a wide-brimmed hat, and sunglasses whenever you are outside. HEART DISEASE, DIABETES, AND HIGH BLOOD PRESSURE   High blood pressure causes heart disease and increases  the risk of stroke. High blood pressure is more likely to develop in:  People who have blood pressure in the high end of the normal range (130-139/85-89 mm Hg).  People who are overweight or obese.  People who are African American.  If you are 35-39 years of age, have your blood pressure checked every 3-5 years. If you are 22 years of age or older, have your blood pressure checked every year. You should have your blood pressure measured twice--once when you are at a hospital or clinic, and once when you are not at a hospital or clinic. Record the average of the two measurements. To check your blood pressure when you are not at a hospital or clinic, you can use:  An automated blood pressure machine at a pharmacy.  A home blood pressure monitor.  If you are between 46 years and 39 years old, ask your health care provider if you should take aspirin to prevent strokes.  Have regular diabetes screenings. This involves taking a blood sample to check your fasting blood sugar level.  If you are at a normal weight and have a low risk for diabetes, have this test once every three years after 30 years of age.  If you are overweight and have a high risk for diabetes, consider being tested at a [place] age or more often. PREVENTING INFECTION  Hepatitis B  If you have a higher risk for hepatitis B, you should be screened for this virus. You are considered at high risk for hepatitis B if:  You were born in a country where hepatitis B is common. Ask your health care provider which countries are considered high risk.  Your parents were born in a high-risk country, and you have not been immunized against hepatitis B (hepatitis B vaccine).  You have HIV or  AIDS.  You use needles to inject street drugs.  You live with someone who has hepatitis B.  You have had sex with someone who has hepatitis B.  You get hemodialysis treatment.  You take certain medicines for conditions, including cancer, organ transplantation, and autoimmune conditions. Hepatitis C  Blood testing is recommended for:  Everyone born from [birth year] through 1965.  Anyone with known risk factors for hepatitis C. Sexually transmitted infections (STIs)  You should be screened for sexually transmitted infections (STIs) including gonorrhea and chlamydia if:  You are sexually active and are [place] than 30 years of age.  You are older than 30 years of age and your health care provider tells you that you are at risk for this type of infection.  Your sexual activity has changed since you were last screened and you are at an increased risk for chlamydia or gonorrhea. Ask your health care provider if you are at risk.  If you do not have HIV, but are at risk, it may be recommended that you take a prescription medicine daily to prevent HIV infection. This is called pre-exposure prophylaxis (PrEP). You are considered at risk if:  You are sexually active and do not regularly use condoms or know the HIV status of your partner(s).  You take drugs by injection.  You are sexually active with a partner who has HIV. Talk with your health care provider about whether you are at high risk of being infected with HIV. If you choose to begin PrEP, you should first be tested for HIV. You should then be tested every 3 months for as long as you are taking PrEP.  PREGNANCY  If you are premenopausal and you may become pregnant, ask your health care provider about preconception counseling.  If you may become pregnant, take 400 to 800 micrograms (mcg) of folic acid every day.  If you want to prevent pregnancy, talk to your health care provider about birth control (contraception). OSTEOPOROSIS AND  MENOPAUSE   Osteoporosis is a disease in which the bones lose minerals and strength with aging. This can result in serious bone fractures. Your risk for osteoporosis can be identified using a bone density scan.  If you are 44 years of age or older, or if you are at risk for osteoporosis and fractures, ask your health care provider if you should be screened.  Ask your health care provider whether you should take a calcium or vitamin D supplement to lower your risk for osteoporosis.  Menopause may have certain physical symptoms and risks.  Hormone replacement therapy may reduce some of these symptoms and risks. Talk to your health care provider about whether hormone replacement therapy is right for you.  HOME CARE INSTRUCTIONS   Schedule regular health, dental, and eye exams.  Stay current with your immunizations.   Do not use any tobacco products including cigarettes, chewing tobacco, or electronic cigarettes.  If you are pregnant, do not drink alcohol.  If you are breastfeeding, limit how much and how often you drink alcohol.  Limit alcohol intake to no more than 1 drink per day for nonpregnant women. One drink equals 12 ounces of beer, 5 ounces of wine, or 1 ounces of hard liquor.  Do not use street drugs.  Do not share needles.  Ask your health care provider for help if you need support or information about quitting drugs.  Tell your health care provider if you often feel depressed.  Tell your health care provider if you have ever been abused or do not feel safe at home.   This information is not intended to replace advice given to you by your health care provider. Make sure you discuss any questions you have with your health care provider.   Document Released: 10/10/2010 Document Revised: 04/17/2014 Document Reviewed: 02/26/2013 Elsevier Interactive Patient Education 2016 [place]. Pneumococcal Vaccine, Polyvalent solution for injection What is this  medicine? PNEUMOCOCCAL VACCINE, POLYVALENT (NEU mo KOK al vak SEEN, pol ee VEY luhnt) is a vaccine to prevent pneumococcus bacteria infection. These bacteria are a major cause of ear infections, Strep throat infections, and serious pneumonia, meningitis, or blood infections worldwide. These vaccines help the body to produce antibodies (protective substances) that help your body defend against these bacteria. This vaccine is recommended for people 51 years of age and older with health problems. It is also recommended for all adults over 47 years old. This vaccine will not treat an infection. This medicine may be used for other purposes; ask your health care provider or pharmacist if you have questions. What should I tell my health care provider before I take this medicine? They need to know if you have any of these conditions: -bleeding problems -bone marrow or organ transplant -cancer, Hodgkin's disease -fever -infection -immune system problems -low platelet count in the blood -seizures -an unusual or allergic reaction to pneumococcal vaccine, diphtheria toxoid, other vaccines, latex, other medicines, foods, dyes, or preservatives -pregnant or trying to get pregnant -breast-feeding How should I use this medicine? This vaccine is for injection into a muscle or under the skin. It is given by a health care professional. A copy of Vaccine Information Statements  will be given before each vaccination. Read this sheet carefully each time. The sheet may change frequently. Talk to your pediatrician regarding the use of this medicine in children. While this drug may be prescribed for children as young as 51 years of age for selected conditions, precautions do apply. Overdosage: If you think you have taken too much of this medicine contact a poison control center or emergency room at once. NOTE: This medicine is only for you. Do not share this medicine with others. What if I miss a dose? It is important  not to miss your dose. Call your doctor or health care professional if you are unable to keep an appointment. What may interact with this medicine? -medicines for cancer chemotherapy -medicines that suppress your immune function -medicines that treat or prevent blood clots like warfarin, enoxaparin, and dalteparin -steroid medicines like prednisone or cortisone This list may not describe all possible interactions. Give your health care provider a list of all the medicines, herbs, non-prescription drugs, or dietary supplements you use. Also tell them if you smoke, drink alcohol, or use illegal drugs. Some items may interact with your medicine. What should I watch for while using this medicine? Mild fever and pain should go away in 3 days or less. Report any unusual symptoms to your doctor or health care professional. What side effects may I notice from receiving this medicine? Side effects that you should report to your doctor or health care professional as soon as possible: -allergic reactions like skin rash, itching or hives, swelling of the face, lips, or tongue -breathing problems -confused -fever over 102 degrees F -pain, tingling, numbness in the hands or feet -seizures -unusual bleeding or bruising -unusual muscle weakness Side effects that usually do not require medical attention (report to your doctor or health care professional if they continue or are bothersome): -aches and pains -diarrhea -fever of 102 degrees F or less -headache -irritable -loss of appetite -pain, tender at site where injected -trouble sleeping This list may not describe all possible side effects. Call your doctor for medical advice about side effects. You may report side effects to FDA at 1-800-FDA-1088. Where should I keep my medicine? This does not apply. This vaccine is given in a clinic, pharmacy, doctor's office, or other health care setting and will not be stored at home. NOTE: This sheet is a  summary. It may not cover all possible information. If you have questions about this medicine, talk to your doctor, pharmacist, or health care provider.    2016, Elsevier/[employer] Standard. (2007-11-01 14:32:37) Hepatitis B Vaccine: What You Need to Know 1. Why get vaccinated? Hepatitis B is a serious disease that affects the liver. It is caused by the hepatitis B virus. Hepatitis B can cause mild illness lasting a few weeks, or it can lead to a serious, lifelong illness. Hepatitis B virus infection can be either acute or chronic. Acute hepatitis B virus infection is a short-term illness that occurs within the first 6 months after someone is exposed to the hepatitis B virus. This can lead to:  fever, fatigue, loss of appetite, nausea, and/or vomiting  jaundice (yellow skin or eyes, dark urine, clay-colored bowel movements)  pain in muscles, joints, and stomach Chronic hepatitis B virus infection is a long-term illness that occurs when the hepatitis B virus remains in a person's body. Most people who go on to develop chronic hepatitis B do not have symptoms, but it is still very serious and can lead to:  liver  damage (cirrhosis)  liver cancer  death Chronically-infected people can spread hepatitis B virus to others, even if they do not feel or look sick themselves. Up to 1.4 million people in the [place] may have chronic hepatitis B infection. About 90% of infants who get hepatitis B become chronically infected and about 1 out of 4 of them dies. Hepatitis B is spread when blood, semen, or other body fluid infected with the Hepatitis B virus enters the body of a person who is not infected. People can become infected with the virus through:  Birth (a baby whose mother is infected can be infected at or after birth)  Sharing items such as razors or toothbrushes with an infected person  Contact with the blood or open sores of an infected person  Sex with an infected partner  Sharing  needles, syringes, or other drug-injection equipment  Exposure to blood from needlesticks or other sharp instruments Each year about 2,000 people in the [place] die from hepatitis B-related liver disease. Hepatitis B vaccine can prevent hepatitis B and its consequences, including liver cancer and cirrhosis. 2. Hepatitis B vaccine Hepatitis B vaccine is made from parts of the hepatitis B virus. It cannot cause hepatitis B infection. The vaccine is usually given as 3 or 4 shots over a 28-monthperiod. Infants should get their first dose of hepatitis B vaccine at birth and will usually complete the series at 624months of age. All children and adolescents [place] than 170years of age who have not yet gotten the vaccine should also be vaccinated. Hepatitis B vaccine is recommended for unvaccinated adults who are at risk for hepatitis B virus infection, including:  People whose sex partners have hepatitis B  Sexually active persons who are not in a long-term monogamous relationship  Persons seeking evaluation or treatment for a sexually transmitted disease  Men who have sexual contact with other men  People who share needles, syringes, or other drug-injection equipment  People who have household contact with someone infected with the hepatitis B virus  Health care and public safety workers at risk for exposure to blood or body fluids  Residents and staff of facilities for developmentally disabled persons  Persons in correctional facilities  Victims of sexual assault or abuse  Travelers to regions with increased rates of hepatitis B  People with chronic liver disease, kidney disease, HIV infection, or diabetes  Anyone who wants to be protected from hepatitis B There are no known risks to getting hepatitis B vaccine at the same time as other vaccines. 3. Some people should not get this vaccine Tell the person who is giving the vaccine:  If the person getting the vaccine has any  severe, life-threatening allergies. If you ever had a life-threatening allergic reaction after a dose of hepatitis B vaccine, or have a severe allergy to any part of this vaccine, you may be advised not to get vaccinated. Ask your health care provider if you want information about vaccine components.  If the person getting the vaccine is not feeling well. If you have a mild illness, such as a cold, you can probably get the vaccine today. If you are moderately or severely ill, you should probably wait until you recover. Your doctor can advise you. 4. Risks of a vaccine reaction With any medicine, including vaccines, there is a chance of side effects. These are usually mild and go away on their own, but serious reactions are also possible. Most people who get hepatitis  B vaccine do not have any problems with it. Minor problems following hepatitis B vaccine include:  soreness where the shot was given  temperature of 99.62F or higher If these problems occur, they usually begin soon after the shot and last 1 or 2 days. Your doctor can tell you more about these reactions. Other problems that could happen after this vaccine:  People sometimes faint after a medical procedure, including vaccination. Sitting or lying down for about 15 minutes can help prevent fainting and injuries caused by a fall. Tell your provider if you feel dizzy, or have vision changes or ringing in the ears.  Some people get shoulder pain that can be more severe and longer-lasting than the more routine soreness that can follow injections. This happens very rarely.  Any medication can cause a severe allergic reaction. Such reactions from a vaccine are very rare, estimated at about 1 in a million doses, and would happen within a few minutes to a few hours after the vaccination. As with any medicine, there is a very remote chance of a vaccine causing a serious injury or death. The safety of vaccines is always being monitored. For  more information, visit: http://www.aguilar.org/ 5. What if there is a serious problem? What should I look for?  Look for anything that concerns you, such as signs of a severe allergic reaction, very high fever, or unusual behavior. Signs of a severe allergic reaction can include hives, swelling of the face and throat, difficulty breathing, a fast heartbeat, dizziness, and weakness. These would start a few minutes to a few hours after the vaccination. What should I do?  If you think it is a severe allergic reaction or other emergency that can't wait, call 9-1-1 or get to the nearest hospital. Otherwise, call your clinic. Afterward, the reaction should be reported to the Vaccine Adverse Event Reporting System (VAERS). Your doctor should file this report, or you can do it yourself through the VAERS web site at www.vaers.SamedayNews.es, or by calling 209-028-3616. VAERS does not give medical advice. 6. The National Vaccine Injury Compensation Program The [employer] Vaccine Injury Compensation Program (VICP) is a federal program that was created to compensate people who may have been injured by certain vaccines. Persons who believe they may have been injured by a vaccine can learn about the program and about filing a claim by calling 912-230-5992 or visiting the [place] website at GoldCloset.com.ee. There is a time limit to file a claim for compensation. 7. How can I learn more?  Ask your healthcare provider. He or she can give you the vaccine package insert or suggest other sources of information.  Call your local or state health department.  Contact the Centers for Disease Control and Prevention (CDC):  Call 320-530-3177 (1-800-CDC-INFO) or  Visit CDC's website at http://hunter.com/ CDC Hepatitis B VIS (10/28/2014)   This information is not intended to replace advice given to you by your health care provider. Make sure you discuss any questions you have with your health care  provider.   Document Released: 01/19/2006 Document Revised: 12/16/2014 Document Reviewed: 11/14/2014 Elsevier Interactive Patient Education 2016 [place]. Hepatitis A Vaccine: What You Need to Know 1. Why get vaccinated? Hepatitis A is a serious liver disease. It is caused by the hepatitis A virus (HAV). HAV is spread from person to person through contact with the feces (stool) of people who are infected, which can easily happen if someone does not wash his or her hands properly. You can also get hepatitis  A from food, water, or objects contaminated with HAV. Symptoms of hepatitis A can include:  fever, fatigue, loss of appetite, nausea, vomiting, and/or joint pain  severe stomach pains and diarrhea (mainly in children), or  jaundice (yellow skin or eyes, dark urine, clay-colored bowel movements). These symptoms usually appear 2 to 6 weeks after exposure and usually last less than 2 months, although some people can be ill for as long as 6 months. If you have hepatitis A you may be too ill to work. Children often do not have symptoms, but most adults do. You can spread HAV without having symptoms. Hepatitis A can cause liver failure and death, although this is rare and occurs more commonly in persons 29 years of age or older and persons with other liver diseases, such as hepatitis B or C. Hepatitis A vaccine can prevent hepatitis A. Hepatitis A vaccines were recommended in the [place] beginning in 1996. Since then, the number of cases reported each year in the [place] has dropped from around 31,000 cases to fewer than 1,500 cases. 2. Hepatitis A vaccine Hepatitis A vaccine is an inactivated (killed) vaccine. You will need 2 doses for long-lasting protection. These doses should be given at least 6 months apart. Children are routinely vaccinated between their first and second birthdays (63 through 85 months of age). Older children and adolescents can get the vaccine after 23 months. Adults  who have not been vaccinated previously and want to be protected against hepatitis A can also get the vaccine. You should get hepatitis A vaccine if you:  are traveling to countries where hepatitis A is common,  are a man who has sex with other men,  use illegal drugs,  have a chronic liver disease such as hepatitis B or hepatitis C,  are being treated with clotting-factor concentrates,  work with hepatitis A-infected animals or in a hepatitis A research laboratory, or  expect to have close personal contact with an international adoptee from a country where hepatitis A is common Ask your healthcare provider if you want more information about any of these groups. There are no known risks to getting hepatitis A vaccine at the same time as other vaccines. 3. Some people should not get this vaccine Tell the person who is giving you the vaccine:  If you have any severe, life-threatening allergies. If you ever had a life-threatening allergic reaction after a dose of hepatitis A vaccine, or have a severe allergy to any part of this vaccine, you may be advised not to get vaccinated. Ask your health care provider if you want information about vaccine components.  If you are not feeling well. If you have a mild illness, such as a cold, you can probably get the vaccine today. If you are moderately or severely ill, you should probably wait until you recover. Your doctor can advise you. 4. Risks of a vaccine reaction With any medicine, including vaccines, there is a chance of side effects. These are usually mild and go away on their own, but serious reactions are also possible. Most people who get hepatitis A vaccine do not have any problems with it. Minor problems following hepatitis A vaccine include:  soreness or redness where the shot was given  low-grade fever  headache  tiredness If these problems occur, they usually begin soon after the shot and last 1 or 2 days. Your doctor can tell  you more about these reactions. Other problems that could happen after this vaccine:  People sometimes  faint after a medical procedure, including vaccination. Sitting or lying down for about 15 minutes can help prevent fainting, and injuries caused by a fall. Tell your provider if you feel dizzy, or have vision changes or ringing in the ears.  Some people get shoulder pain that can be more severe and longer lasting than the more routine soreness that can follow injections. This happens very rarely.  Any medication can cause a severe allergic reaction. Such reactions from a vaccine are very rare, estimated at about 1 in a million doses, and would happen within a few minutes to a few hours after the vaccination. As with any medicine, there is a very remote chance of a vaccine causing a serious injury or death. The safety of vaccines is always being monitored. For more information, visit: http://www.aguilar.org/ 5. What if there is a serious problem? What should I look for?  Look for anything that concerns you, such as signs of a severe allergic reaction, very high fever, or unusual behavior. Signs of a severe allergic reaction can include hives, swelling of the face and throat, difficulty breathing, a fast heartbeat, dizziness, and weakness. These would start a few minutes to a few hours after the vaccination. What should I do?  If you think it is a severe allergic reaction or other emergency that can't wait, call 9-1-1 or get to the nearest hospital. Otherwise, call your clinic. Afterward, the reaction should be reported to the Vaccine Adverse Event Reporting System (VAERS). Your doctor should file this report, or you can do it yourself through the VAERS web site at www.vaers.SamedayNews.es, or by calling 628-316-6485. VAERS does not give medical advice. 6. The National Vaccine Injury Compensation Program The [employer] Vaccine Injury Compensation Program (VICP) is a federal program that was created  to compensate people who may have been injured by certain vaccines. Persons who believe they may have been injured by a vaccine can learn about the program and about filing a claim by calling 405-384-0349 or visiting the [place] website at GoldCloset.com.ee. There is a time limit to file a claim for compensation. 7. How can I learn more?  Ask your healthcare provider. He or she can give you the vaccine package insert or suggest other sources of information.  Call your local or state health department.  Contact the Centers for Disease Control and Prevention (CDC):  Call 269-761-1172 (1-800-CDC-INFO) or  Visit CDC's website at http://hunter.com/ CDC Hepatitis A Vaccine VIS (10/28/2014)   This information is not intended to replace advice given to you by your health care provider. Make sure you discuss any questions you have with your health care provider.   Document Released: 01/19/2006 Document Revised: 12/16/2014 Document Reviewed: 11/14/2014 Elsevier Interactive Patient Education 2016 [place] Can Quit Smoking If you are ready to quit smoking or are thinking about it, congratulations! You have chosen to help yourself be healthier and live longer! There are lots of different ways to quit smoking. Nicotine gum, nicotine patches, a nicotine inhaler, or nicotine nasal spray can help with physical craving. Hypnosis, support groups, and medicines help break the habit of smoking. TIPS TO GET OFF AND STAY OFF CIGARETTES  Learn to predict your moods. Do not let a bad situation be your excuse to have a cigarette. Some situations in your life might tempt you to have a cigarette.  Ask friends and co-workers not to smoke around you.  Make your home smoke-free.  Never have "just one" cigarette. It leads to wanting another  and another. Remind yourself of your decision to quit.  On a card, make a list of your reasons for not smoking. Read it at least the same number of  times a day as you have a cigarette. Tell yourself everyday, "I do not want to smoke. I choose not to smoke."  Ask someone at home or work to help you with your plan to quit smoking.  Have something planned after you eat or have a cup of coffee. Take a walk or get other exercise to perk you up. This will help to keep you from overeating.  Try a relaxation exercise to calm you down and decrease your stress. Remember, you may be tense and nervous the first two weeks after you quit. This will pass.  Find new activities to keep your hands busy. Play with a pen, coin, or rubber band. Doodle or draw things on paper.  Brush your teeth right after eating. This will help cut down the craving for the taste of tobacco after meals. You can try mouthwash too.  Try gum, breath mints, or diet candy to keep something in your mouth. IF YOU SMOKE AND WANT TO QUIT:  Do not stock up on cigarettes. Never buy a carton. Wait until one pack is finished before you buy another.  Never carry cigarettes with you at work or at home.  Keep cigarettes as far away from you as possible. Leave them with someone else.  Never carry matches or a lighter with you.  Ask yourself, "Do I need this cigarette or is this just a reflex?"  Bet with someone that you can quit. Put cigarette money in a piggy bank every morning. If you smoke, you give up the money. If you do not smoke, by the end of the week, you keep the money.  Keep trying. It takes 21 days to change a habit!  Talk to your doctor about using medicines to help you quit. These include nicotine replacement gum, lozenges, or skin patches.   This information is not intended to replace advice given to you by your health care provider. Make sure you discuss any questions you have with your health care provider.   Document Released: 01/21/2009 Document Revised: 06/19/2011 Document Reviewed: 01/21/2009 Elsevier Interactive Patient Education 2016 [place].  Back  Exercises If you have pain in your back, do these exercises 2-3 times each day or as told by your doctor. When the pain goes away, do the exercises once each day, but repeat the steps more times for each exercise (do more repetitions). If you do not have pain in your back, do these exercises once each day or as told by your doctor. EXERCISES Single Knee to Chest Do these steps 3-5 times in a row for each leg: 1. Lie on your back on a firm bed or the floor with your legs stretched out. 2. Bring one knee to your chest. 3. Hold your knee to your chest by grabbing your knee or thigh. 4. Pull on your knee until you feel a gentle stretch in your lower back. 5. Keep doing the stretch for 10-30 seconds. 6. Slowly let go of your leg and straighten it. Pelvic Tilt Do these steps 5-10 times in a row: 1. Lie on your back on a firm bed or the floor with your legs stretched out. 2. Bend your knees so they point up to the ceiling. Your feet should be flat on the floor. 3. Tighten your lower belly (abdomen) muscles to  press your lower back against the floor. This will make your tailbone point up to the ceiling instead of pointing down to your feet or the floor. 4. Stay in this position for 5-10 seconds while you gently tighten your muscles and breathe evenly. Cat-Cow Do these steps until your lower back bends more easily: 1. Get on your hands and knees on a firm surface. Keep your hands under your shoulders, and keep your knees under your hips. You may put padding under your knees. 2. Let your head hang down, and make your tailbone point down to the floor so your lower back is round like the back of a cat. 3. Stay in this position for 5 seconds. 4. Slowly lift your head and make your tailbone point up to the ceiling so your back hangs low (sags) like the back of a cow. 5. Stay in this position for 5 seconds. Press-Ups Do these steps 5-10 times in a row: 1. Lie on your belly (face-down) on the  floor. 2. Place your hands near your head, about shoulder-width apart. 3. While you keep your back relaxed and keep your hips on the floor, slowly straighten your arms to raise the top half of your body and lift your shoulders. Do not use your back muscles. To make yourself more comfortable, you may change where you place your hands. 4. Stay in this position for 5 seconds. 5. Slowly return to lying flat on the floor. Bridges Do these steps 10 times in a row: 1. Lie on your back on a firm surface. 2. Bend your knees so they point up to the ceiling. Your feet should be flat on the floor. 3. Tighten your butt muscles and lift your butt off of the floor until your waist is almost as high as your knees. If you do not feel the muscles working in your butt and the back of your thighs, slide your feet 1-2 inches farther away from your butt. 4. Stay in this position for 3-5 seconds. 5. Slowly lower your butt to the floor, and let your butt muscles relax. If this exercise is too easy, try doing it with your arms crossed over your chest. Belly Crunches Do these steps 5-10 times in a row: 1. Lie on your back on a firm bed or the floor with your legs stretched out. 2. Bend your knees so they point up to the ceiling. Your feet should be flat on the floor. 3. Cross your arms over your chest. 4. Tip your chin a little bit toward your chest but do not bend your neck. 5. Tighten your belly muscles and slowly raise your chest just enough to lift your shoulder blades a tiny bit off of the floor. 6. Slowly lower your chest and your head to the floor. Back Lifts Do these steps 5-10 times in a row: 1. Lie on your belly (face-down) with your arms at your sides, and rest your forehead on the floor. 2. Tighten the muscles in your legs and your butt. 3. Slowly lift your chest off of the floor while you keep your hips on the floor. Keep the back of your head in line with the curve in your back. Look at the floor while  you do this. 4. Stay in this position for 3-5 seconds. 5. Slowly lower your chest and your face to the floor. GET HELP IF:  Your back pain gets a lot worse when you do an exercise.  Your back pain does not lessen  2 hours after you exercise. If you have any of these problems, stop doing the exercises. Do not do them again unless your doctor says it is okay. GET HELP RIGHT AWAY IF:  You have sudden, very bad back pain. If this happens, stop doing the exercises. Do not do them again unless your doctor says it is okay.   This information is not intended to replace advice given to you by your health care provider. Make sure you discuss any questions you have with your health care provider.   Document Released: 04/29/2010 Document Revised: 12/16/2014 Document Reviewed: 05/21/2014 Elsevier Interactive Patient Education [employer].

## 2016-01-06 NOTE — Assessment & Plan Note (Signed)
Last check, viral load was undetectable. Unable to draw today- labs to be checked next visit.

## 2016-01-07 ENCOUNTER — Encounter: Payer: Self-pay | Admitting: Family Medicine

## 2016-01-07 LAB — COMPREHENSIVE METABOLIC PANEL
ALK PHOS: 71 IU/L (ref 39–117)
ALT: 13 IU/L (ref 0–32)
AST: 16 IU/L (ref 0–40)
Albumin/Globulin Ratio: 1.5 (ref 1.2–2.2)
Albumin: 4.2 g/dL (ref 3.5–5.5)
BUN / CREAT RATIO: 23 (ref 9–23)
BUN: 13 mg/dL (ref 6–20)
Bilirubin Total: 0.2 mg/dL (ref 0.0–1.2)
CALCIUM: 9.3 mg/dL (ref 8.7–10.2)
CO2: 21 mmol/L (ref 18–29)
CREATININE: 0.56 mg/dL — AB (ref 0.57–1.00)
Chloride: 101 mmol/L (ref 96–106)
GFR calc Af Amer: 145 mL/min/{1.73_m2} (ref >59)
GFR calc non Af Amer: 126 mL/min/{1.73_m2} (ref >59)
Globulin, Total: 2.8 g/dL (ref 1.5–4.5)
Glucose: 106 mg/dL — ABNORMAL HIGH (ref 65–99)
Potassium: 4.1 mmol/L (ref 3.5–5.2)
Sodium: 140 mmol/L (ref 134–144)
Total Protein: 7 g/dL (ref 6.0–8.5)

## 2016-01-07 LAB — CBC WITH DIFFERENTIAL/PLATELET
BASOS ABS: 0.1 10*3/uL (ref 0.0–0.2)
Basos: 1 %
EOS (ABSOLUTE): 0.1 10*3/uL (ref 0.0–0.4)
Eos: 1 %
Hematocrit: 41.3 % (ref 34.0–46.6)
Hemoglobin: 13.5 g/dL (ref 11.1–15.9)
IMMATURE GRANULOCYTES: 0 %
Immature Grans (Abs): 0 10*3/uL (ref 0.0–0.1)
Lymphocytes Absolute: 3 10*3/uL (ref 0.7–3.1)
Lymphs: 35 %
MCH: 28.2 pg (ref 26.6–33.0)
MCHC: 32.7 g/dL (ref 31.5–35.7)
MCV: 86 fL (ref 79–97)
MONOS ABS: 0.6 10*3/uL (ref 0.1–0.9)
Monocytes: 7 %
NEUTROS PCT: 56 %
Neutrophils Absolute: 4.7 10*3/uL (ref 1.4–7.0)
PLATELETS: 273 10*3/uL (ref 150–379)
RBC: 4.79 x10E6/uL (ref 3.77–5.28)
RDW: 15 % (ref 12.3–15.4)
WBC: 8.5 10*3/uL (ref 3.4–10.8)

## 2016-01-07 LAB — LIPID PANEL W/O CHOL/HDL RATIO
CHOLESTEROL TOTAL: 160 mg/dL (ref 100–199)
HDL: 31 mg/dL — AB (ref >39)
LDL CALC: 60 mg/dL (ref 0–99)
Triglycerides: 343 mg/dL — ABNORMAL HIGH (ref 0–149)
VLDL CHOLESTEROL CAL: 69 mg/dL — AB (ref 5–40)

## 2016-01-07 LAB — TSH: TSH: 0.852 u[IU]/mL (ref 0.450–4.500)

## 2016-01-07 LAB — HGB A1C W/O EAG: Hgb A1c MFr Bld: 5.5 % (ref 4.8–5.6)

## 2016-01-08 LAB — UA/M W/RFLX CULTURE, ROUTINE
BILIRUBIN UA: NEGATIVE
Glucose, UA: NEGATIVE
KETONES UA: NEGATIVE
Nitrite, UA: NEGATIVE
PH UA: 8.5 — AB (ref 5.0–7.5)
RBC UA: NEGATIVE
Specific Gravity, UA: 1.015 (ref 1.005–1.030)
UUROB: 0.2 mg/dL (ref 0.2–1.0)

## 2016-01-08 LAB — MICROSCOPIC EXAMINATION

## 2016-01-08 LAB — URINE CULTURE, REFLEX

## 2016-01-19 ENCOUNTER — Inpatient Hospital Stay: Admission: RE | Admit: 2016-01-19 | Payer: Self-pay | Source: Ambulatory Visit

## 2016-01-19 ENCOUNTER — Encounter: Payer: Self-pay | Admitting: Family Medicine

## 2016-01-19 ENCOUNTER — Ambulatory Visit
Admission: RE | Admit: 2016-01-19 | Discharge: 2016-01-19 | Disposition: A | Discharge status: Home / Self Care | Payer: Medicaid Other | Source: Ambulatory Visit | Attending: Family Medicine | Admitting: Family Medicine

## 2016-01-19 ENCOUNTER — Ambulatory Visit (INDEPENDENT_AMBULATORY_CARE_PROVIDER_SITE_OTHER): Payer: Medicaid Other | Admitting: Family Medicine

## 2016-01-19 VITALS — BP 110/73 | HR 93 | Temp 99.1°F | Wt 220.0 lb

## 2016-01-19 DIAGNOSIS — M545 Low back pain, unspecified: Secondary | ICD-10-CM

## 2016-01-19 LAB — UA/M W/RFLX CULTURE, ROUTINE
Bilirubin, UA: NEGATIVE
GLUCOSE, UA: NEGATIVE
KETONES UA: NEGATIVE
LEUKOCYTES UA: NEGATIVE
NITRITE UA: NEGATIVE
RBC, UA: NEGATIVE
SPEC GRAV UA: 1.015 (ref 1.005–1.030)
Urobilinogen, Ur: 1 mg/dL (ref 0.2–1.0)
pH, UA: 7.5 (ref 5.0–7.5)

## 2016-01-19 LAB — CBC WITH DIFFERENTIAL/PLATELET
Hematocrit: 38.3 % (ref 34.0–46.6)
Hemoglobin: 11.8 g/dL (ref 11.1–15.9)
LYMPHS: 26 %
Lymphocytes Absolute: 1.7 10*3/uL (ref 0.7–3.1)
MCH: 27.3 pg (ref 26.6–33.0)
MCHC: 30.8 g/dL — AB (ref 31.5–35.7)
MCV: 89 fL (ref 79–97)
MID (Absolute): 0.5 10*3/uL (ref 0.1–1.6)
MID: 7 %
NEUTROS ABS: 4.5 10*3/uL (ref 1.4–7.0)
Neutrophils: 67 %
PLATELETS: 263 10*3/uL (ref 150–379)
RBC: 4.32 x10E6/uL (ref 3.77–5.28)
RDW: 14.5 % (ref 12.3–15.4)
WBC: 6.7 10*3/uL (ref 3.4–10.8)

## 2016-01-19 LAB — MICROSCOPIC EXAMINATION

## 2016-01-19 MED ORDER — TRIAMCINOLONE ACETONIDE 40 MG/ML IJ SUSP
40.0000 mg | Freq: Once | INTRAMUSCULAR | Status: AC
  Administered 2016-01-19: 40 mg via INTRAMUSCULAR

## 2016-01-19 NOTE — Progress Notes (Signed)
BP 110/73   Pulse 93   Temp 99.1 F (37.3 C)   Wt 220 lb (99.8 kg)   LMP 01/16/2016 (Approximate)   SpO2 96%   BMI 39.85 kg/m    Subjective:    Patient ID: Katie Stanley, female    DOB: May 21, 1985, 30 y.o.   MRN: [id number]  HPI: Katie Stanley is a 30 y.o. female  Chief Complaint  Patient presents with  . Back Pain    x 4 days. no known injury, but did move a heavy box. Middle of her back and rotates around her right side.   Patient presents with 4 day history of b/l mid/lower back pain. No known injury, worst on right. Describes the pain as sharp and constant, worse with any type of movement. Still having the b/l lbp that she has been struggling with for 2 months now, but this seems like something different to her. Denies urinary symptoms, radiation, numbness or tingling down legs. Has felt slightly feverish today. Has been taking tizanidine and advil with minimal relief.   Relevant past medical, surgical, family and social history reviewed and updated as indicated. Interim medical history since our last visit reviewed. Allergies and medications reviewed and updated.  Review of Systems  Constitutional: Positive for fever.  HENT: Negative.   Respiratory: Negative.   Cardiovascular: Negative.   Gastrointestinal: Negative.   Genitourinary: Negative.   Musculoskeletal: Positive for back pain and gait problem.  Skin: Negative.  Negative for wound.  Psychiatric/Behavioral: Negative.     Per HPI unless specifically indicated above     Objective:    BP 110/73   Pulse 93   Temp 99.1 F (37.3 C)   Wt 220 lb (99.8 kg)   LMP 01/16/2016 (Approximate)   SpO2 96%   BMI 39.85 kg/m   Wt Readings from Last 3 Encounters:  01/19/16 220 lb (99.8 kg)  01/06/16 205 lb 12.8 oz (93.4 kg)  12/16/15 217 lb (98.4 kg)    Physical Exam  Constitutional: She appears well-developed and well-nourished.  HENT:  Head: Atraumatic.  Eyes: Conjunctivae are normal. No scleral icterus.    Neck: Normal range of motion. Neck supple.  Cardiovascular: Normal rate and normal heart sounds.   Pulmonary/Chest: Effort normal. No respiratory distress.  Abdominal: Soft. Bowel sounds are normal. There is no tenderness.  Musculoskeletal: She exhibits tenderness (B/l midback paraspinal ttp).  Antalgic gait Difficult to assess back ROM given patient's discomfort and effort toward minimizing movements   Lymphadenopathy:    She has no cervical adenopathy.  Nursing note and vitals reviewed.     Assessment & Plan:   Problem List Items Addressed This Visit    None    Visit Diagnoses    Acute bilateral low back pain without sciatica    -  Primary   Relevant Medications   triamcinolone acetonide (KENALOG-40) injection 40 mg (Completed)   Other Relevant Orders   DG Lumbar Spine Complete   AMB referral to orthopedics   CBC With Differential/Platelet   UA/M w/rflx Culture, Routine    U/A negative for UTI or evidence of possible nephrolithiasis. Patient does have slight low-grade temp and hx of drug abuse - CBC with normal white count. Will administer IM kenalog in office and send for x-ray. Will refer to orthopedics given the chronicity of her back issues the past few months with poor response to medications and exercises. Continue muscle relaxer, heat pad, and gentle stretches/massage as tolerated. Rest. Work note given.  Follow up plan: Return if symptoms worsen or fail to improve.

## 2016-01-19 NOTE — Patient Instructions (Signed)
Follow up as needed

## 2016-01-20 ENCOUNTER — Telehealth: Payer: Self-pay | Admitting: Family Medicine

## 2016-01-20 NOTE — Telephone Encounter (Signed)
Patient notified

## 2016-01-20 NOTE — Telephone Encounter (Signed)
Please call patient and let her know that her x-ray did show a possible issue with one of her lumbar vertebrae and that this will be further evaluated when she sees orthopedics soon. Avoid any major physical activities or lifting until she can get in with orthopedics.

## 2016-02-03 ENCOUNTER — Ambulatory Visit (INDEPENDENT_AMBULATORY_CARE_PROVIDER_SITE_OTHER): Payer: Medicaid Other | Admitting: Family Medicine

## 2016-02-03 ENCOUNTER — Encounter: Payer: Self-pay | Admitting: Family Medicine

## 2016-02-03 VITALS — BP 124/85 | HR 79 | Temp 98.8°F | Wt 202.9 lb

## 2016-02-03 DIAGNOSIS — M545 Low back pain, unspecified: Secondary | ICD-10-CM

## 2016-02-03 DIAGNOSIS — K219 Gastro-esophageal reflux disease without esophagitis: Secondary | ICD-10-CM | POA: Diagnosis not present

## 2016-02-03 DIAGNOSIS — F419 Anxiety disorder, unspecified: Secondary | ICD-10-CM

## 2016-02-03 MED ORDER — PROPRANOLOL HCL 10 MG PO TABS: 10.0000 mg | ORAL_TABLET | Freq: Three times a day (TID) | ORAL | 1 refills | Status: DC

## 2016-02-03 MED ORDER — CITALOPRAM HYDROBROMIDE 20 MG PO TABS: 20.0000 mg | ORAL_TABLET | Freq: Every day | ORAL | 3 refills | Status: DC

## 2016-02-03 MED ORDER — OMEPRAZOLE 40 MG PO CPDR: 40.0000 mg | DELAYED_RELEASE_CAPSULE | Freq: Every day | ORAL | 1 refills | Status: DC

## 2016-02-03 NOTE — Addendum Note (Signed)
Addended by: Valerie Roys on: 02/03/2016 01:59 PM   Modules accepted: Orders

## 2016-02-03 NOTE — Patient Instructions (Addendum)
Back Exercises If you have pain in your back, do these exercises 2-3 times each day or as told by your doctor. When the pain goes away, do the exercises once each day, but repeat the steps more times for each exercise (do more repetitions). If you do not have pain in your back, do these exercises once each day or as told by your doctor. EXERCISES Single Knee to Chest Do these steps 3-5 times in a row for each leg: 1. Lie on your back on a firm bed or the floor with your legs stretched out. 2. Bring one knee to your chest. 3. Hold your knee to your chest by grabbing your knee or thigh. 4. Pull on your knee until you feel a gentle stretch in your lower back. 5. Keep doing the stretch for 10-30 seconds. 6. Slowly let go of your leg and straighten it. Pelvic Tilt Do these steps 5-10 times in a row: 1. Lie on your back on a firm bed or the floor with your legs stretched out. 2. Bend your knees so they point up to the ceiling. Your feet should be flat on the floor. 3. Tighten your lower belly (abdomen) muscles to press your lower back against the floor. This will make your tailbone point up to the ceiling instead of pointing down to your feet or the floor. 4. Stay in this position for 5-10 seconds while you gently tighten your muscles and breathe evenly. Cat-Cow Do these steps until your lower back bends more easily: 1. Get on your hands and knees on a firm surface. Keep your hands under your shoulders, and keep your knees under your hips. You may put padding under your knees. 2. Let your head hang down, and make your tailbone point down to the floor so your lower back is round like the back of a cat. 3. Stay in this position for 5 seconds. 4. Slowly lift your head and make your tailbone point up to the ceiling so your back hangs low (sags) like the back of a cow. 5. Stay in this position for 5 seconds. Press-Ups Do these steps 5-10 times in a row: 1. Lie on your belly (face-down) on the  floor. 2. Place your hands near your head, about shoulder-width apart. 3. While you keep your back relaxed and keep your hips on the floor, slowly straighten your arms to raise the top half of your body and lift your shoulders. Do not use your back muscles. To make yourself more comfortable, you may change where you place your hands. 4. Stay in this position for 5 seconds. 5. Slowly return to lying flat on the floor. Bridges Do these steps 10 times in a row: 1. Lie on your back on a firm surface. 2. Bend your knees so they point up to the ceiling. Your feet should be flat on the floor. 3. Tighten your butt muscles and lift your butt off of the floor until your waist is almost as high as your knees. If you do not feel the muscles working in your butt and the back of your thighs, slide your feet 1-2 inches farther away from your butt. 4. Stay in this position for 3-5 seconds. 5. Slowly lower your butt to the floor, and let your butt muscles relax. If this exercise is too easy, try doing it with your arms crossed over your chest. Belly Crunches Do these steps 5-10 times in a row: 1. Lie on your back on a firm bed  or the floor with your legs stretched out. 2. Bend your knees so they point up to the ceiling. Your feet should be flat on the floor. 3. Cross your arms over your chest. 4. Tip your chin a little bit toward your chest but do not bend your neck. 5. Tighten your belly muscles and slowly raise your chest just enough to lift your shoulder blades a tiny bit off of the floor. 6. Slowly lower your chest and your head to the floor. Back Lifts Do these steps 5-10 times in a row: 1. Lie on your belly (face-down) with your arms at your sides, and rest your forehead on the floor. 2. Tighten the muscles in your legs and your butt. 3. Slowly lift your chest off of the floor while you keep your hips on the floor. Keep the back of your head in line with the curve in your back. Look at the floor while  you do this. 4. Stay in this position for 3-5 seconds. 5. Slowly lower your chest and your face to the floor. GET HELP IF:  Your back pain gets a lot worse when you do an exercise.  Your back pain does not lessen 2 hours after you exercise. If you have any of these problems, stop doing the exercises. Do not do them again unless your doctor says it is okay. GET HELP RIGHT AWAY IF:  You have sudden, very bad back pain. If this happens, stop doing the exercises. Do not do them again unless your doctor says it is okay.   This information is not intended to replace advice given to you by your health care provider. Make sure you discuss any questions you have with your health care provider.   Document Released: 04/29/2010 Document Revised: 12/16/2014 Document Reviewed: 05/21/2014 Elsevier Interactive Patient Education 2016 [place].  Gastroesophageal Reflux Disease, Adult Normally, food travels down the esophagus and stays in the stomach to be digested. If a person has gastroesophageal reflux disease (GERD), food and stomach acid move back up into the esophagus. When this happens, the esophagus becomes sore and swollen (inflamed). Over time, GERD can make small holes (ulcers) in the lining of the esophagus. HOME CARE Diet  Follow a diet as told by your doctor. You may need to avoid foods and drinks such as:  Coffee and tea (with or without caffeine).  Drinks that contain alcohol.  Energy drinks and sports drinks.  Carbonated drinks or sodas.  Chocolate and cocoa.  Peppermint and mint flavorings.  Garlic and onions.  Horseradish.  Spicy and acidic foods, such as peppers, chili powder, curry powder, vinegar, hot sauces, and BBQ sauce.  Citrus fruit juices and citrus fruits, such as oranges, lemons, and limes.  Tomato-based foods, such as red sauce, chili, salsa, and pizza with red sauce.  Fried and fatty foods, such as donuts, french fries, potato chips, and high-fat  dressings.  High-fat meats, such as hot dogs, rib eye steak, sausage, ham, and bacon.  High-fat dairy items, such as whole milk, butter, and cream cheese.  Eat small meals often. Avoid eating large meals.  Avoid drinking large amounts of liquid with your meals.  Avoid eating meals during the 2-3 hours before bedtime.  Avoid lying down right after you eat.  Do not exercise right after you eat. General Instructions  Pay attention to any changes in your symptoms.  Take over-the-counter and prescription medicines only as told by your doctor. Do not take aspirin, ibuprofen, or other NSAIDs  unless your doctor says it is okay.  Do not use any tobacco products, including cigarettes, chewing tobacco, and e-cigarettes. If you need help quitting, ask your doctor.  Wear loose clothes. Do not wear anything tight around your waist.  Raise (elevate) the head of your bed about 6 inches (15 cm).  Try to lower your stress. If you need help doing this, ask your doctor.  If you are overweight, lose an amount of weight that is healthy for you. Ask your doctor about a safe weight loss goal.  Keep all follow-up visits as told by your doctor. This is important. GET HELP IF:  You have new symptoms.  You lose weight and you do not know why it is happening.  You have trouble swallowing, or it hurts to swallow.  You have wheezing or a cough that keeps happening.  Your symptoms do not get better with treatment.  You have a hoarse voice. GET HELP RIGHT AWAY IF:  You have pain in your arms, neck, jaw, teeth, or back.  You feel sweaty, dizzy, or light-headed.  You have chest pain or shortness of breath.  You throw up (vomit) and your throw up looks like blood or coffee grounds.  You pass out (faint).  Your poop (stool) is bloody or black.  You cannot swallow, drink, or eat.   This information is not intended to replace advice given to you by your health care provider. Make sure you  discuss any questions you have with your health care provider.   Document Released: 09/13/2007 Document Revised: 12/16/2014 Document Reviewed: 07/22/2014 Elsevier Interactive Patient Education [employer].

## 2016-02-03 NOTE — Assessment & Plan Note (Addendum)
Not doing well. Went back on her celexa 20mg . Refill given today. Will recheck in 6 weeks to see if we need to adjust dose.

## 2016-02-03 NOTE — Progress Notes (Signed)
BP 124/85 (BP Location: Left Arm, Patient Position: Sitting, Cuff Size: Large)   Pulse 79   Temp 98.8 F (37.1 C)   Wt 202 lb 14.4 oz (92 kg)   LMP 01/16/2016 (Approximate)   SpO2 98%   BMI 36.75 kg/m    Subjective:    Patient ID: Katie Stanley, female    DOB: 1985-11-04, 30 y.o.   MRN: [id number]  HPI: Katie Stanley is a 30 y.o. female  Chief Complaint  Patient presents with  . Back Pain  . Gastroesophageal Reflux   BACK PAIN- referral last visit to orthopedics for her chronic back pain. Saw them 2 weeks ago. Feels so much better now. Back to her normal self. No more back pain.   GERD GERD control status: controlled  Satisfied with current treatment? yes Heartburn frequency: still getting it 3-4x a week Medication side effects: no  Medication compliance: excellent Previous GERD medications: omeprazole 20, zantac Antacid use frequency: rarely   Duration: hours Nature: burning Location: substernal Heartburn duration:  Alleviatiating factors:   Aggravating factors:  Dysphagia: no Odynophagia:  no Hematemesis: no Blood in stool: no EGD: no   ANXIETY/STRESS- restarted her celexa and has been doing better with that and her propranalol Duration:better Anxious mood: yes  Excessive worrying: no Irritability: yes  Sweating: no Nausea: no Palpitations:no Hyperventilation: no Panic attacks: yes Agoraphobia: yes  Obscessions/compulsions: yes Depressed mood: yes Depression screen PHQ 2/9 12/02/2015  Decreased Interest 0  Down, Depressed, Hopeless 0  PHQ - 2 Score 0   Anhedonia: no Weight changes: no Insomnia: yes hard to fall asleep  Hypersomnia: no Fatigue/loss of energy: yes Feelings of worthlessness: no Feelings of guilt: no Impaired concentration/indecisiveness: no Suicidal ideations: no  Crying spells: no Recent Stressors/Life Changes: no  Relevant past medical, surgical, family and social history reviewed and updated as indicated. Interim  medical history since our last visit reviewed. Allergies and medications reviewed and updated.  Review of Systems  Constitutional: Negative.   Respiratory: Negative.   Cardiovascular: Negative.   Musculoskeletal: Negative.   Psychiatric/Behavioral: Negative for agitation, behavioral problems, confusion, decreased concentration, dysphoric mood, hallucinations, self-injury, sleep disturbance and suicidal ideas. The patient is nervous/anxious. The patient is not hyperactive.     Per HPI unless specifically indicated above     Objective:    BP 124/85 (BP Location: Left Arm, Patient Position: Sitting, Cuff Size: Large)   Pulse 79   Temp 98.8 F (37.1 C)   Wt 202 lb 14.4 oz (92 kg)   LMP 01/16/2016 (Approximate)   SpO2 98%   BMI 36.75 kg/m   Wt Readings from Last 3 Encounters:  02/03/16 202 lb 14.4 oz (92 kg)  01/19/16 220 lb (99.8 kg)  01/06/16 205 lb 12.8 oz (93.4 kg)    Physical Exam  Constitutional: She is oriented to person, place, and time. She appears well-developed and well-nourished. No distress.  HENT:  Head: Normocephalic and atraumatic.  Right Ear: Hearing normal.  Left Ear: Hearing normal.  Nose: Nose normal.  Eyes: Conjunctivae and lids are normal. Right eye exhibits no discharge. Left eye exhibits no discharge. No scleral icterus.  Cardiovascular: Normal rate, regular rhythm, normal heart sounds and intact distal pulses.  Exam reveals no gallop and no friction rub.   No murmur heard. Pulmonary/Chest: Effort normal and breath sounds normal. No respiratory distress. She has no wheezes. She has no rales. She exhibits no tenderness.  Musculoskeletal: Normal range of motion.  Neurological: She is alert  and oriented to person, place, and time.  Skin: Skin is warm, dry and intact. No rash noted. No erythema. No pallor.  Psychiatric: She has a normal mood and affect. Her speech is normal and behavior is normal. Judgment and thought content normal. Cognition and memory are  normal.  Nursing note and vitals reviewed.   Results for orders placed or performed in visit on 01/19/16  Microscopic Examination  Result Value Ref Range   WBC, UA 0-5 0 - 5 /hpf   RBC, UA 0-2 0 - 2 /hpf   Epithelial Cells (non renal) 0-10 0 - 10 /hpf   Bacteria, UA Few (A) None seen/Few  CBC With Differential/Platelet  Result Value Ref Range   WBC 6.7 3.4 - 10.8 x10E3/uL   RBC 4.32 3.77 - 5.28 x10E6/uL   Hemoglobin 11.8 11.1 - 15.9 g/dL   Hematocrit 38.3 34.0 - 46.6 %   MCV 89 79 - 97 fL   MCH 27.3 26.6 - 33.0 pg   MCHC 30.8 (L) 31.5 - 35.7 g/dL   RDW 14.5 12.3 - 15.4 %   Platelets 263 150 - 379 x10E3/uL   Neutrophils 67 Not Estab. %   Lymphs 26 Not Estab. %   MID 7 Not Estab. %   Neutrophils Absolute 4.5 1.4 - 7.0 x10E3/uL   Lymphocytes Absolute 1.7 0.7 - 3.1 x10E3/uL   MID (Absolute) 0.5 0.1 - 1.6 X10E3/uL  UA/M w/rflx Culture, Routine  Result Value Ref Range   Specific Gravity, UA 1.015 1.005 - 1.030   pH, UA 7.5 5.0 - 7.5   Color, UA Yellow Yellow   Appearance Ur Clear Clear   Leukocytes, UA Negative Negative   Protein, UA 1+ (A) Negative/Trace   Glucose, UA Negative Negative   Ketones, UA Negative Negative   RBC, UA Negative Negative   Bilirubin, UA Negative Negative   Urobilinogen, Ur 1.0 0.2 - 1.0 mg/dL   Nitrite, UA Negative Negative   Microscopic Examination See below:       Assessment & Plan:   Problem List Items Addressed This Visit      Other   Anxiety - Primary    Not doing well. Went back on her celexa, but doesn't know the dose. Will call with the dose and we will refill. Recheck 6 weeks to see if dose needs to be adjusted.        Other Visit Diagnoses    Acute bilateral low back pain without sciatica       Resolved. Continue exercises. Call with any concerns.    Relevant Medications   meloxicam (MOBIC) 15 MG tablet   Gastroesophageal reflux disease, esophagitis presence not specified       Stable. Continue current regimen. Continue to  monitor.    Relevant Medications   omeprazole (PRILOSEC) 40 MG capsule       Follow up plan: Return in about 6 weeks (around 03/16/2016) for Follow up mood.

## 2016-03-03 ENCOUNTER — Encounter: Payer: Self-pay | Admitting: Family Medicine

## 2016-03-03 ENCOUNTER — Ambulatory Visit (INDEPENDENT_AMBULATORY_CARE_PROVIDER_SITE_OTHER): Payer: Medicaid Other | Admitting: Family Medicine

## 2016-03-03 VITALS — BP 107/71 | HR 59 | Temp 98.1°F | Ht 62.0 in | Wt 205.0 lb

## 2016-03-03 DIAGNOSIS — G8929 Other chronic pain: Secondary | ICD-10-CM | POA: Diagnosis not present

## 2016-03-03 DIAGNOSIS — M545 Low back pain: Secondary | ICD-10-CM | POA: Diagnosis not present

## 2016-03-03 MED ORDER — METHOCARBAMOL 500 MG PO TABS: 500.0000 mg | ORAL_TABLET | Freq: Four times a day (QID) | ORAL | 0 refills | Status: DC

## 2016-03-03 MED ORDER — TRAMADOL HCL 50 MG PO TABS: 50.0000 mg | ORAL_TABLET | Freq: Three times a day (TID) | ORAL | 0 refills | Status: DC | PRN

## 2016-03-03 NOTE — Progress Notes (Signed)
BP 107/71 (BP Location: Left Arm, Patient Position: Sitting, Cuff Size: Normal)   Pulse (!) 59   Temp 98.1 F (36.7 C)   Ht 5\' 2"  (1.575 m)   Wt 205 lb (93 kg)   SpO2 97%   BMI 37.49 kg/m    Subjective:    Patient ID: Katie Stanley, female    DOB: 03/10/1986, 30 y.o.   MRN: [id number]  HPI: Katie Stanley is a 30 y.o. female  Chief Complaint  Patient presents with  . Back Pain   BACK PAIN Duration: 2 weeks Mechanism of injury: no trauma Location: Right, low back and upper back Onset: sudden Severity: severe Quality: pulling and tight Frequency: constant Radiation: none Aggravating factors: laying Alleviating factors: walking and stretching Status: worse Treatments attempted: rest, ice, heat, APAP, ibuprofen, aleve and HEP  Relief with NSAIDs?: mild Nighttime pain:  yes Paresthesias / decreased sensation:  no Bowel / bladder incontinence:  no Fevers:  no Dysuria / urinary frequency:  no  Relevant past medical, surgical, family and social history reviewed and updated as indicated. Interim medical history since our last visit reviewed. Allergies and medications reviewed and updated.  Review of Systems  Constitutional: Negative.   Respiratory: Negative.   Cardiovascular: Negative.   Musculoskeletal: Positive for back pain and myalgias. Negative for arthralgias, gait problem, joint swelling, neck pain and neck stiffness.  Neurological: Negative.   Psychiatric/Behavioral: Negative.     Per HPI unless specifically indicated above     Objective:    BP 107/71 (BP Location: Left Arm, Patient Position: Sitting, Cuff Size: Normal)   Pulse (!) 59   Temp 98.1 F (36.7 C)   Ht 5\' 2"  (1.575 m)   Wt 205 lb (93 kg)   SpO2 97%   BMI 37.49 kg/m   Wt Readings from Last 3 Encounters:  03/03/16 205 lb (93 kg)  02/03/16 202 lb 14.4 oz (92 kg)  01/19/16 220 lb (99.8 kg)    Physical Exam  Constitutional: She is oriented to person, place, and time. She appears  well-developed and well-nourished. She appears distressed.  HENT:  Head: Normocephalic and atraumatic.  Right Ear: Hearing normal.  Left Ear: Hearing normal.  Nose: Nose normal.  Eyes: Conjunctivae and lids are normal. Right eye exhibits no discharge. Left eye exhibits no discharge. No scleral icterus.  Pulmonary/Chest: Effort normal. No respiratory distress.  Musculoskeletal: She exhibits tenderness. She exhibits no edema or deformity.  Neurological: She is alert and oriented to person, place, and time. She has normal reflexes. She displays normal reflexes. No cranial nerve deficit. She exhibits normal muscle tone. Coordination normal.  Skin: Skin is warm, dry and intact. No rash noted. She is not diaphoretic. No erythema. No pallor.  Psychiatric: She has a normal mood and affect. Her speech is normal and behavior is normal. Judgment and thought content normal. Cognition and memory are normal.  Nursing note and vitals reviewed. Back Exam:    Inspection:  Normal spinal curvature.  No deformity, ecchymosis, erythema, or lesions     Palpation:     Midline spinal tenderness: no      Paralumbar tenderness: yes Right     Parathoracic tenderness: yes Right     Buttocks tenderness: no     Range of Motion:      Flexion: Fingers to Knees     Extension:Decreased     Lateral bending:Decreased    Rotation:Decreased    Neuro Exam:Lower extremity DTRs normal & symmetric.  Strength and sensation intact.    Special Tests:      Straight leg raise:negative   Results for orders placed or performed in visit on 01/19/16  Microscopic Examination  Result Value Ref Range   WBC, UA 0-5 0 - 5 /hpf   RBC, UA 0-2 0 - 2 /hpf   Epithelial Cells (non renal) 0-10 0 - 10 /hpf   Bacteria, UA Few (A) None seen/Few  CBC With Differential/Platelet  Result Value Ref Range   WBC 6.7 3.4 - 10.8 x10E3/uL   RBC 4.32 3.77 - 5.28 x10E6/uL   Hemoglobin 11.8 11.1 - 15.9 g/dL   Hematocrit 38.3 34.0 - 46.6 %   MCV 89 79  - 97 fL   MCH 27.3 26.6 - 33.0 pg   MCHC 30.8 (L) 31.5 - 35.7 g/dL   RDW 14.5 12.3 - 15.4 %   Platelets 263 150 - 379 x10E3/uL   Neutrophils 67 Not Estab. %   Lymphs 26 Not Estab. %   MID 7 Not Estab. %   Neutrophils Absolute 4.5 1.4 - 7.0 x10E3/uL   Lymphocytes Absolute 1.7 0.7 - 3.1 x10E3/uL   MID (Absolute) 0.5 0.1 - 1.6 X10E3/uL  UA/M w/rflx Culture, Routine  Result Value Ref Range   Specific Gravity, UA 1.015 1.005 - 1.030   pH, UA 7.5 5.0 - 7.5   Color, UA Yellow Yellow   Appearance Ur Clear Clear   Leukocytes, UA Negative Negative   Protein, UA 1+ (A) Negative/Trace   Glucose, UA Negative Negative   Ketones, UA Negative Negative   RBC, UA Negative Negative   Bilirubin, UA Negative Negative   Urobilinogen, Ur 1.0 0.2 - 1.0 mg/dL   Nitrite, UA Negative Negative   Microscopic Examination See below:       Assessment & Plan:   Problem List Items Addressed This Visit    None    Visit Diagnoses    Acute exacerbation of chronic low back pain    -  Primary   Not sure what she did. Will change to robaxin from tizanidine. Refill of tramadol given. Follow up 2 weeks. Stretches. Call with any concerns.    Relevant Medications   methocarbamol (ROBAXIN) 500 MG tablet   traMADol (ULTRAM) 50 MG tablet       Follow up plan: Return in about 2 weeks (around 03/17/2016) for Follow up back pain.

## 2016-03-16 ENCOUNTER — Ambulatory Visit: Payer: Medicaid Other | Admitting: Family Medicine

## 2016-05-01 ENCOUNTER — Encounter: Payer: Self-pay | Admitting: Family Medicine

## 2016-05-01 DIAGNOSIS — F1911 Other psychoactive substance abuse, in remission: Secondary | ICD-10-CM | POA: Insufficient documentation

## 2016-05-22 ENCOUNTER — Telehealth: Payer: Self-pay | Admitting: Family Medicine

## 2016-05-22 ENCOUNTER — Encounter: Payer: Self-pay | Admitting: Family Medicine

## 2016-05-22 NOTE — Telephone Encounter (Signed)
Patient sent mychart message asking for appointment- can you please get her in tomorrow at 3:30?

## 2016-05-22 NOTE — Telephone Encounter (Signed)
Routing to provider  

## 2016-06-07 ENCOUNTER — Other Ambulatory Visit: Payer: Self-pay | Admitting: Family Medicine

## 2016-06-07 ENCOUNTER — Ambulatory Visit
Admission: RE | Admit: 2016-06-07 | Discharge: 2016-06-07 | Disposition: A | Discharge status: Home / Self Care | Payer: Medicaid Other | Source: Ambulatory Visit | Attending: Family Medicine | Admitting: Family Medicine

## 2016-06-07 ENCOUNTER — Ambulatory Visit (INDEPENDENT_AMBULATORY_CARE_PROVIDER_SITE_OTHER): Payer: Medicaid Other | Admitting: Family Medicine

## 2016-06-07 ENCOUNTER — Encounter: Payer: Self-pay | Admitting: Family Medicine

## 2016-06-07 ENCOUNTER — Telehealth: Payer: Self-pay | Admitting: Family Medicine

## 2016-06-07 ENCOUNTER — Other Ambulatory Visit
Admission: RE | Admit: 2016-06-07 | Discharge: 2016-06-07 | Disposition: A | Discharge status: Home / Self Care | Payer: Medicaid Other | Source: Ambulatory Visit | Attending: Family Medicine | Admitting: Family Medicine

## 2016-06-07 ENCOUNTER — Telehealth: Payer: Self-pay

## 2016-06-07 VITALS — BP 109/71 | HR 80 | Temp 98.5°F | Resp 17 | Ht 62.0 in | Wt 210.0 lb

## 2016-06-07 DIAGNOSIS — M545 Low back pain, unspecified: Secondary | ICD-10-CM

## 2016-06-07 DIAGNOSIS — N390 Urinary tract infection, site not specified: Secondary | ICD-10-CM

## 2016-06-07 DIAGNOSIS — R8281 Pyuria: Secondary | ICD-10-CM

## 2016-06-07 DIAGNOSIS — R1011 Right upper quadrant pain: Secondary | ICD-10-CM | POA: Diagnosis not present

## 2016-06-07 DIAGNOSIS — B182 Chronic viral hepatitis C: Secondary | ICD-10-CM | POA: Diagnosis not present

## 2016-06-07 DIAGNOSIS — Z9049 Acquired absence of other specified parts of digestive tract: Secondary | ICD-10-CM | POA: Insufficient documentation

## 2016-06-07 LAB — COMPREHENSIVE METABOLIC PANEL WITH GFR
ALT: 21 U/L (ref 14–54)
AST: 24 U/L (ref 15–41)
Albumin: 3.8 g/dL (ref 3.5–5.0)
Alkaline Phosphatase: 74 U/L (ref 38–126)
Anion gap: 8 (ref 5–15)
BUN: 9 mg/dL (ref 6–20)
CO2: 28 mmol/L (ref 22–32)
Calcium: 8.8 mg/dL — ABNORMAL LOW (ref 8.9–10.3)
Chloride: 102 mmol/L (ref 101–111)
Creatinine, Ser: 0.53 mg/dL (ref 0.44–1.00)
GFR calc Af Amer: >60 mL/min
GFR calc non Af Amer: >60 mL/min
Glucose, Bld: 96 mg/dL (ref 65–99)
Potassium: 4 mmol/L (ref 3.5–5.1)
Sodium: 138 mmol/L (ref 135–145)
Total Bilirubin: 0.4 mg/dL (ref 0.3–1.2)
Total Protein: 7.1 g/dL (ref 6.5–8.1)

## 2016-06-07 LAB — AMYLASE: Amylase: 45 U/L (ref 28–100)

## 2016-06-07 LAB — CBC WITH DIFFERENTIAL/PLATELET
BASOS ABS: 0.1 10*3/uL (ref 0–0.1)
Basophils Relative: 1 %
EOS PCT: 4 %
Eosinophils Absolute: 0.3 10*3/uL (ref 0–0.7)
HEMATOCRIT: 35.7 % (ref 35.0–47.0)
HEMOGLOBIN: 12.7 g/dL (ref 12.0–16.0)
LYMPHS ABS: 2.4 10*3/uL (ref 1.0–3.6)
LYMPHS PCT: 33 %
MCH: 29 pg (ref 26.0–34.0)
MCHC: 35.5 g/dL (ref 32.0–36.0)
MCV: 81.5 fL (ref 80.0–100.0)
Monocytes Absolute: 0.6 10*3/uL (ref 0.2–0.9)
Monocytes Relative: 8 %
NEUTROS ABS: 3.9 10*3/uL (ref 1.4–6.5)
NEUTROS PCT: 54 %
PLATELETS: 266 10*3/uL (ref 150–440)
RBC: 4.38 MIL/uL (ref 3.80–5.20)
RDW: 14.2 % (ref 11.5–14.5)
WBC: 7.3 10*3/uL (ref 3.6–11.0)

## 2016-06-07 LAB — LIPASE, BLOOD: LIPASE: 28 U/L (ref 11–51)

## 2016-06-07 MED ORDER — TIZANIDINE HCL 4 MG PO TABS: 4.0000 mg | ORAL_TABLET | Freq: Three times a day (TID) | ORAL | 1 refills | Status: DC | PRN

## 2016-06-07 MED ORDER — IBUPROFEN 800 MG PO TABS: 800.0000 mg | ORAL_TABLET | Freq: Three times a day (TID) | ORAL | 1 refills | Status: DC | PRN

## 2016-06-07 NOTE — Telephone Encounter (Signed)
Patient notified and understood.

## 2016-06-07 NOTE — Telephone Encounter (Signed)
Please call pt and let her know her u/s was normal. Await lab results over the next few days, return as scheduled. If worsening, call or go to ER

## 2016-06-07 NOTE — Telephone Encounter (Signed)
Stephanie from Radiology called with the STAT abdominal u/s results. It was Normal, s/p Cholecystectomy.

## 2016-06-07 NOTE — Telephone Encounter (Signed)
Accidentally opened up a new telephone encounter in response - pt has been notified as documented on other feed.

## 2016-06-07 NOTE — Progress Notes (Signed)
BP 109/71 (BP Location: Right Arm, Patient Position: Sitting, Cuff Size: Large)   Pulse 80   Temp 98.5 F (36.9 C) (Oral)   Resp 17   Ht 5\' 2"  (1.575 m)   Wt 210 lb (95.3 kg)   LMP 05/17/2016   SpO2 95%   BMI 38.41 kg/m    Subjective:    Patient ID: Katie Stanley, female    DOB: 14-Sep-1985, 31 y.o.   MRN: [id number]  HPI: Katie Stanley is a 31 y.o. female  Chief Complaint  Patient presents with  . Back Pain    Onset 1 month worse in last week  . Urinary Frequency    5-6 weeks   ABDOMINAL PAIN  Duration: about 5 weeks, worse in the past week, has been constant for the past few days Onset: sudden Severity: 8/10 Quality: sharp and shooting Location:  RUQ  Episode duration: constant Radiation: from her R flank into her RUQ Frequency: constant Alleviating factors: ibuprofen for a couple of hours Aggravating factors: nothing Status: worse Treatments attempted: ibuprofen Fever: no Nausea: yes Vomiting: no Weight loss: no Decreased appetite: yes Diarrhea: yes Constipation: no Blood in stool: no Heartburn: yes Jaundice: no Rash: no Dysuria/urinary frequency: no Hematuria: no History of sexually transmitted disease: no Recurrent NSAID use: yes   Relevant past medical, surgical, family and social history reviewed and updated as indicated. Interim medical history since our last visit reviewed. Allergies and medications reviewed and updated.  Review of Systems  Constitutional: Negative.   Respiratory: Negative.   Cardiovascular: Negative.   Gastrointestinal: Positive for abdominal pain, diarrhea and nausea. Negative for abdominal distention, anal bleeding, blood in stool, constipation, rectal pain and vomiting.  Genitourinary: Negative for decreased urine volume, difficulty urinating, dyspareunia, dysuria, enuresis, flank pain, frequency, genital sores, hematuria, menstrual problem, pelvic pain, urgency, vaginal bleeding, vaginal discharge and vaginal pain.    Musculoskeletal: Positive for back pain and myalgias. Negative for arthralgias, gait problem, joint swelling, neck pain and neck stiffness.  Psychiatric/Behavioral: Negative.     Per HPI unless specifically indicated above     Objective:    BP 109/71 (BP Location: Right Arm, Patient Position: Sitting, Cuff Size: Large)   Pulse 80   Temp 98.5 F (36.9 C) (Oral)   Resp 17   Ht 5\' 2"  (1.575 m)   Wt 210 lb (95.3 kg)   LMP 05/17/2016   SpO2 95%   BMI 38.41 kg/m   Wt Readings from Last 3 Encounters:  06/07/16 210 lb (95.3 kg)  03/03/16 205 lb (93 kg)  02/03/16 202 lb 14.4 oz (92 kg)    Physical Exam  Constitutional: She is oriented to person, place, and time. She appears well-developed and well-nourished. No distress.  HENT:  Head: Normocephalic and atraumatic.  Right Ear: Hearing normal.  Left Ear: Hearing normal.  Nose: Nose normal.  Eyes: Conjunctivae and lids are normal. Right eye exhibits no discharge. Left eye exhibits no discharge. No scleral icterus.  Cardiovascular: Normal rate, regular rhythm, normal heart sounds and intact distal pulses.  Exam reveals no gallop and no friction rub.   No murmur heard. Pulmonary/Chest: Effort normal and breath sounds normal. No respiratory distress. She has no wheezes. She has no rales. She exhibits no tenderness.  Abdominal: Soft. Normal appearance and bowel sounds are normal. She exhibits no distension and no mass. There is no hepatosplenomegaly, splenomegaly or hepatomegaly. There is tenderness in the right upper quadrant. There is guarding and positive Murphy's sign. There  is no rigidity, no rebound, no CVA tenderness and no tenderness at McBurney's point.  Musculoskeletal: Normal range of motion.  Neurological: She is alert and oriented to person, place, and time.  Skin: Skin is warm, dry and intact. No rash noted. No erythema. No pallor.  Psychiatric: She has a normal mood and affect. Her speech is normal and behavior is normal.  Judgment and thought content normal. Cognition and memory are normal.  Nursing note and vitals reviewed.   Results for orders placed or performed in visit on 05/01/16  CBC and differential  Result Value Ref Range   Hemoglobin 14.5 12.0 - 16.0 g/dL   HCT 44 36 - 46 %   Platelets 241 150 - 399 K/L   WBC 8.8 10^3/mL  HM PAP SMEAR  Result Value Ref Range   HM Pap smear Negative with Negative HPV   Basic metabolic panel  Result Value Ref Range   Glucose 90 mg/dL   BUN 8 4 - 21 mg/dL   Creatinine 0.7 0.5 - 1.1 mg/dL   Potassium 4.2 3.4 - 5.3 mmol/L   Sodium 138 137 - 147 mmol/L  Lipid panel  Result Value Ref Range   Triglycerides 286 (A) 40 - 160 mg/dL   Cholesterol 171 0 - 200 mg/dL   HDL 30 (A) 35 - 70 mg/dL   LDL Cholesterol 84 mg/dL  Hepatic function panel  Result Value Ref Range   Alkaline Phosphatase 70 25 - 125 U/L   ALT 17 7 - 35 U/L   AST 22 13 - 35 U/L   Bilirubin, Total 0.6 mg/dL  TSH  Result Value Ref Range   TSH 1.16 0.41 - 5.90 uIU/mL      Assessment & Plan:   Problem List Items Addressed This Visit      Digestive   Chronic hepatitis C virus infection ([place])    S/P treatment- she states that the GI said that she was clear. We will recheck viral load. Await results.       Relevant Orders   HCV RNA quant   Comprehensive metabolic panel   CBC with Differential/Platelet   Amylase   Lipase    Other Visit Diagnoses    RUQ pain    -  Primary   Very tender in the RUQ. Pt is s/p ccy- will check RUQ to look for liver pathology and check labs including amylase and lipase. Await results.    Relevant Orders   US Abdomen Limited RUQ   HCV RNA quant   Comprehensive metabolic panel   CBC with Differential/Platelet   Amylase   Lipase   Acute bilateral low back pain without sciatica       Pain is chronic. Cannot do PT. Continue exercises, muscle relaxer and ibuprofen. If not improving, consider referral to physiatry/sports med. Refills given.   Relevant  Medications   ibuprofen (ADVIL,MOTRIN) 800 MG tablet   Other Relevant Orders   UA/M w/rflx Culture, Routine (STAT)   Pyuria       Does not look like UTI on UA- will await culture and treat as needed.        Follow up plan: Return Pending [place] results- 1 week for follow up belly pain .

## 2016-06-07 NOTE — Assessment & Plan Note (Signed)
S/P treatment- she states that the GI said that she was clear. We will recheck viral load. Await results.

## 2016-06-08 ENCOUNTER — Telehealth: Payer: Self-pay | Admitting: Family Medicine

## 2016-06-08 LAB — MICROSCOPIC EXAMINATION: RBC, UA: NONE SEEN /hpf (ref 0–?)

## 2016-06-08 LAB — UA/M W/RFLX CULTURE, ROUTINE
BILIRUBIN UA: NEGATIVE
Glucose, UA: NEGATIVE
Nitrite, UA: NEGATIVE
RBC UA: NEGATIVE
Specific Gravity, UA: >1.03 — ABNORMAL HIGH (ref 1.005–1.030)
UUROB: 0.2 mg/dL (ref 0.2–1.0)
pH, UA: 5.5 (ref 5.0–7.5)

## 2016-06-08 LAB — URINE CULTURE, REFLEX

## 2016-06-08 NOTE — Telephone Encounter (Signed)
Called and went over the results with patient. [place] and labs normal. Waiting on hep C RNA. UA mixed flora. Her pain is likely due to muscle spasm. She is feeling better. She is due to come in next week for follow up

## 2016-06-11 LAB — MISC LABCORP TEST (SEND OUT): Labcorp test code: 551300

## 2016-06-15 ENCOUNTER — Encounter: Payer: Self-pay | Admitting: Family Medicine

## 2016-06-15 ENCOUNTER — Ambulatory Visit
Admission: RE | Admit: 2016-06-15 | Discharge: 2016-06-15 | Disposition: A | Discharge status: Home / Self Care | Payer: Medicaid Other | Source: Ambulatory Visit | Attending: Family Medicine | Admitting: Family Medicine

## 2016-06-15 ENCOUNTER — Telehealth: Payer: Self-pay | Admitting: Family Medicine

## 2016-06-15 ENCOUNTER — Ambulatory Visit (INDEPENDENT_AMBULATORY_CARE_PROVIDER_SITE_OTHER): Payer: Medicaid Other | Admitting: Family Medicine

## 2016-06-15 VITALS — BP 119/77 | HR 62 | Temp 98.5°F | Resp 17 | Ht 62.0 in | Wt 208.0 lb

## 2016-06-15 DIAGNOSIS — M5134 Other intervertebral disc degeneration, thoracic region: Secondary | ICD-10-CM | POA: Diagnosis not present

## 2016-06-15 DIAGNOSIS — M546 Pain in thoracic spine: Principal | ICD-10-CM

## 2016-06-15 DIAGNOSIS — G8929 Other chronic pain: Secondary | ICD-10-CM | POA: Insufficient documentation

## 2016-06-15 NOTE — Telephone Encounter (Signed)
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 Will continue with current plan to see sports med and see what they recommend.

## 2016-06-15 NOTE — Progress Notes (Signed)
BP 119/77 (BP Location: Left Arm, Patient Position: Sitting, Cuff Size: Normal)   Pulse 62   Temp 98.5 F (36.9 C) (Oral)   Resp 17   Ht 5\' 2"  (1.575 m)   Wt 208 lb (94.3 kg)   LMP 05/17/2016   SpO2 97%   BMI 38.04 kg/m    Subjective:    Patient ID: Katie Stanley, female    DOB: 1986/02/08, 31 y.o.   MRN: [id number]  HPI: Katie Stanley is a 31 y.o. female  Chief Complaint  Patient presents with  . Back Pain   Belly has not been hurting since last week, but still having spasms in her back that go over to her side.   BACK PAIN Duration: 6+ weeks, worse in the past 2 weeks- no better since the last time I saw her Mechanism of injury: unknown, but she slipped and fell after she saw me last time Location: middle back bilaterally Onset: sudden Severity: 8/10 Quality: sharp, aching and pulling Frequency: constant Radiation: none Aggravating factors: lifting and bending, movement Alleviating factors: NSAIDs and muscle relaxer- very little Status: stable Treatments attempted: rest, ice, heat, APAP, ibuprofen and aleve  Relief with NSAIDs?: mild Nighttime pain:  yes Paresthesias / decreased sensation:  no Bowel / bladder incontinence:  no Fevers:  no Dysuria / urinary frequency:  no  Relevant past medical, surgical, family and social history reviewed and updated as indicated. Interim medical history since our last visit reviewed. Allergies and medications reviewed and updated.  Review of Systems  Constitutional: Negative.   Respiratory: Negative.   Cardiovascular: Negative.   Musculoskeletal: Positive for back pain, myalgias, neck pain and neck stiffness. Negative for arthralgias, gait problem and joint swelling.  Neurological: Negative.   Psychiatric/Behavioral: Negative.     Per HPI unless specifically indicated above     Objective:    BP 119/77 (BP Location: Left Arm, Patient Position: Sitting, Cuff Size: Normal)   Pulse 62   Temp 98.5 F (36.9 C)  (Oral)   Resp 17   Ht 5\' 2"  (1.575 m)   Wt 208 lb (94.3 kg)   LMP 05/17/2016   SpO2 97%   BMI 38.04 kg/m   Wt Readings from Last 3 Encounters:  06/15/16 208 lb (94.3 kg)  06/07/16 210 lb (95.3 kg)  03/03/16 205 lb (93 kg)    Physical Exam  Constitutional: She is oriented to person, place, and time. She appears well-developed and well-nourished. No distress.  HENT:  Head: Normocephalic and atraumatic.  Right Ear: Hearing normal.  Left Ear: Hearing normal.  Nose: Nose normal.  Eyes: Conjunctivae and lids are normal. Right eye exhibits no discharge. Left eye exhibits no discharge. No scleral icterus.  Cardiovascular: Normal rate, regular rhythm, normal heart sounds and intact distal pulses.  Exam reveals no gallop and no friction rub.   No murmur heard. Pulmonary/Chest: Effort normal and breath sounds normal. No respiratory distress. She has no wheezes. She has no rales. She exhibits no tenderness.  Neurological: She is alert and oriented to person, place, and time.  Skin: Skin is warm, dry and intact. No rash noted. No erythema. No pallor.  Psychiatric: She has a normal mood and affect. Her speech is normal and behavior is normal. Judgment and thought content normal. Cognition and memory are normal.  Nursing note and vitals reviewed.  Back Exam:    Inspection:  Normal spinal curvature.  No deformity, ecchymosis, erythema, or lesions     Palpation:  Midline spinal tenderness: no      Paralumbar tenderness: yes Right     Parathoracic tenderness: yes Right     Buttocks tenderness: no     Range of Motion:      Flexion: Fingers to Knees     Extension:Decreased     Lateral bending:Decreased    Rotation:Decreased    Neuro Exam:Lower extremity DTRs normal & symmetric.  Strength and sensation intact.    Special Tests:      Straight leg raise:negative  Results for orders placed or performed during the hospital encounter of 06/07/16  Amylase  Result Value Ref Range   Amylase 45  28 - 100 U/L  Lipase, blood  Result Value Ref Range   Lipase 28 11 - 51 U/L  CBC with Differential/Platelet  Result Value Ref Range   WBC 7.3 3.6 - 11.0 K/uL   RBC 4.38 3.80 - 5.20 MIL/uL   Hemoglobin 12.7 12.0 - 16.0 g/dL   HCT 35.7 35.0 - 47.0 %   MCV 81.5 80.0 - 100.0 fL   MCH 29.0 26.0 - 34.0 pg   MCHC 35.5 32.0 - 36.0 g/dL   RDW 14.2 11.5 - 14.5 %   Platelets 266 150 - 440 K/uL   Neutrophils Relative % 54 %   Neutro Abs 3.9 1.4 - 6.5 K/uL   Lymphocytes Relative 33 %   Lymphs Abs 2.4 1.0 - 3.6 K/uL   Monocytes Relative 8 %   Monocytes Absolute 0.6 0.2 - 0.9 K/uL   Eosinophils Relative 4 %   Eosinophils Absolute 0.3 0 - 0.7 K/uL   Basophils Relative 1 %   Basophils Absolute 0.1 0 - 0.1 K/uL  Comprehensive metabolic panel  Result Value Ref Range   Sodium 138 135 - 145 mmol/L   Potassium 4.0 3.5 - 5.1 mmol/L   Chloride 102 101 - 111 mmol/L   CO2 28 22 - 32 mmol/L   Glucose, Bld 96 65 - 99 mg/dL   BUN 9 6 - 20 mg/dL   Creatinine, Ser 0.53 0.44 - 1.00 mg/dL   Calcium 8.8 (L) 8.9 - 10.3 mg/dL   Total Protein 7.1 6.5 - 8.1 g/dL   Albumin 3.8 3.5 - 5.0 g/dL   AST 24 15 - 41 U/L   ALT 21 14 - 54 U/L   Alkaline Phosphatase 74 38 - 126 U/L   Total Bilirubin 0.4 0.3 - 1.2 mg/dL   GFR calc non Af Amer >60 >60 mL/min   GFR calc Af Amer >60 >60 mL/min   Anion gap 8 5 - 15  Miscellaneous [facility] test (send-out)  Result Value Ref Range   [facility] test code 551,300    [facility] test name HEP C VIRUS RNA REAL TIME    Misc [facility] result COMMENT       Assessment & Plan:   Problem List Items Addressed This Visit      Other   Chronic bilateral thoracic back pain - Primary    Still with pain with muscle relaxer and ibuprofen, tolerable with meds, but unable to do most things. X-rays have been normal previously. Will obtain new x-ray given her recent fall. Unable to do PT due to insurance. Has seen ortho without benefit in the past. Will get her into sports medicine to see if they  have any recommendations. Await consult. Referral generated today.      Relevant Orders   DG Lumbar Spine Complete   DG Thoracic Spine W/Swimmers   Ambulatory referral  to Sports Medicine       Follow up plan: Return if symptoms worsen or fail to improve.

## 2016-06-15 NOTE — Assessment & Plan Note (Signed)
Still with pain with muscle relaxer and ibuprofen, tolerable with meds, but unable to do most things. X-rays have been normal previously. Will obtain new x-ray given her recent fall. Unable to do PT due to insurance. Has seen ortho without benefit in the past. Will get her into sports medicine to see if they have any recommendations. Await consult. Referral generated today.

## 2016-07-03 ENCOUNTER — Telehealth: Payer: Self-pay | Admitting: Family Medicine

## 2016-07-03 ENCOUNTER — Other Ambulatory Visit: Payer: Self-pay | Admitting: Family Medicine

## 2016-07-03 MED ORDER — CITALOPRAM HYDROBROMIDE 20 MG PO TABS
30.0000 mg | ORAL_TABLET | Freq: Every day | ORAL | 3 refills | Status: DC
Start: 2016-07-03 — End: 2016-10-19

## 2016-07-03 NOTE — Telephone Encounter (Signed)
Rx sent to her pharmacy 

## 2016-07-03 NOTE — Telephone Encounter (Signed)
Called and let patient know that rx was sent in for her.

## 2016-07-03 NOTE — Telephone Encounter (Signed)
Patient called needs Dr Wynetta Emery to send in a script regarding her Citalopram 20mg  as she had to increase the dose per Dr Wynetta Emery since taking one daily was not helping.  She has increased to 1 1/2 daily.  Thanks  Elfie--[phone]  [employer]

## 2016-07-03 NOTE — Telephone Encounter (Signed)
Routing to provider  

## 2016-07-16 NOTE — Progress Notes (Signed)
[name] [place], [place] [address] Phone: [phone] Subjective:    I'm seeing this patient by the request  of:  Park Liter, DO   CC: Chronic back pain  MPN:[id number]  Katie Stanley is a 31 y.o. female coming in with complaint of chronic back pain. Patient has had this for quite some time. Has been on muscle relaxers as well as ibuprofen which does seem to help but does not seem to be progressing or improving. States that this pain is been going on for multiple months. States that the severity of pain is at out of 10. States that now the pain seems to be constant. Lifting and bending movements seem to make it worse.    patient did have x-rays of the thoracic spine and lumbar. Patient's x-rays taken March 2018 were independently visualized by me. 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  Past Medical History:  Diagnosis Date  . Anxiety   . Biliary calculi 12/07/2009  . Cholelithiasis   . Depression   . Hepatitis C   . Substance abuse    Past Surgical History:  Procedure Laterality Date  . CHOLECYSTECTOMY     Social History   Social History  . Marital status: Single    Spouse name: N/A  . Number of children: N/A  . Years of education: N/A   Social History Main Topics  . Smoking status: Current Every Day Smoker    Packs/day: 0.50    Types: Cigarettes  . Smokeless tobacco: Never Used  . Alcohol use No  . Drug use: Yes    Types: Marijuana  . Sexual activity: Yes    Birth control/ protection: Implant   Other Topics Concern  . None   Social History Narrative  . None   Allergies  Allergen Reactions  . Penicillins Swelling   Family History  Problem Relation Age of Onset  . Hypertension Mother   . Diabetes Father   . Cancer Maternal Grandmother     Liver  . Hypertension Maternal Grandfather   . Heart disease Paternal Grandfather     Past medical history,  social, surgical and family history all reviewed in electronic medical record.  No pertanent information unless stated regarding to the chief complaint.   Review of Systems:Review of systems updated and as accurate as of 07/17/16  No headache, visual changes, nausea, vomiting, diarrhea, constipation, dizziness, abdominal pain, skin rash, fevers, chills, night sweats, weight loss, swollen lymph nodes, body aches, joint swelling, muscle aches, chest pain, shortness of breath, mood changes.   Objective  Blood pressure 110/66, pulse 88, resp. rate 16, weight 209 lb 8 oz (95 kg), SpO2 98 %. Systems examined below as of 07/17/16   General: No apparent distress alert and oriented x3 mood and affect normal, dressed appropriately.  HEENT: Pupils equal, extraocular movements intact  Respiratory: Patient's speak in full sentences and does not appear short of breath  Cardiovascular: No lower extremity edema, non tender, no erythema  Skin: Warm dry intact with no signs of infection or rash on extremities or on axial skeleton.  Abdomen: Soft nontender  Neuro: Cranial nerves II through XII are intact, neurovascularly intact in all extremities with 2+ DTRs and 2+ pulses.  Lymph: No lymphadenopathy of posterior or anterior cervical chain or axillae bilaterally.  Gait normal with good balance and coordination.  MSK:  tender with full range of motion and good stability and symmetric strength  and tone of shoulders, elbows, wrist, hip, knee and ankles bilaterally. Pain is out of proportion for the amount of palpation Back Exam:  Inspection: Unremarkable  Motion: Flexion 20 deg, Extension 15 deg, Side Bending to 25 deg bilaterally,  Rotation to 35 deg bilaterally  SLR laying: Negative  XSLR laying: Negative  Palpable tenderness: Tender to palpation in the paraspinal musculature lumbar spine mostly in the thoracolumbar juncture right greater than left as well as midline. FABER: Significant tightness.. Sensory  change: Gross sensation intact to all lumbar and sacral dermatomes.  Reflexes: 2+ at both patellar tendons, 2+ at achilles tendons, Babinski's downgoing.  Strength at foot  4 out of 5 strength but symmetric.  Procedure note 68257; 15 minutes spent for Therapeutic exercises as stated in above notes.  This included exercises focusing on stretching, strengthening, with significant focus on eccentric aspects.  Low back exercises that included:  Pelvic tilt/bracing instruction to focus on control of the pelvic girdle and lower abdominal muscles  Glute strengthening exercises, focusing on proper firing of the glutes without engaging the low back muscles Proper stretching techniques for maximum relief for the hamstrings, hip flexors, low back and some rotation where tolerated   Proper technique shown and discussed handout in great detail with ATC.  All questions were discussed and answered.     Impression and Recommendations:     This case required medical decision making of moderate complexity.      Note: This dictation was prepared with Dragon dictation along with smaller phrase technology. Any transcriptional errors that result from this process are unintentional.

## 2016-07-17 ENCOUNTER — Other Ambulatory Visit: Payer: Self-pay | Admitting: Family Medicine

## 2016-07-17 ENCOUNTER — Encounter: Payer: Self-pay | Admitting: Family Medicine

## 2016-07-17 ENCOUNTER — Ambulatory Visit (INDEPENDENT_AMBULATORY_CARE_PROVIDER_SITE_OTHER): Payer: Medicaid Other | Admitting: Family Medicine

## 2016-07-17 VITALS — BP 110/66 | HR 88 | Resp 16 | Wt 209.5 lb

## 2016-07-17 DIAGNOSIS — M545 Low back pain: Secondary | ICD-10-CM | POA: Diagnosis not present

## 2016-07-17 DIAGNOSIS — G8929 Other chronic pain: Secondary | ICD-10-CM

## 2016-07-17 DIAGNOSIS — M546 Pain in thoracic spine: Secondary | ICD-10-CM | POA: Diagnosis not present

## 2016-07-17 MED ORDER — KETOROLAC TROMETHAMINE 60 MG/2ML IM SOLN
60.0000 mg | Freq: Once | INTRAMUSCULAR | Status: AC
  Administered 2016-07-17: 60 mg via INTRAMUSCULAR

## 2016-07-17 MED ORDER — METHYLPREDNISOLONE ACETATE 80 MG/ML IJ SUSP
80.0000 mg | Freq: Once | INTRAMUSCULAR | Status: AC
  Administered 2016-07-17: 80 mg via INTRAMUSCULAR

## 2016-07-17 MED ORDER — MELOXICAM 15 MG PO TABS: 15.0000 mg | ORAL_TABLET | Freq: Every day | ORAL | 0 refills | Status: DC

## 2016-07-17 MED ORDER — GABAPENTIN 100 MG PO CAPS: 200.0000 mg | ORAL_CAPSULE | Freq: Every day | ORAL | 3 refills | Status: DC

## 2016-07-17 MED ORDER — VITAMIN D (ERGOCALCIFEROL) 1.25 MG (50000 UNIT) PO CAPS: 50000.0000 [IU] | ORAL_CAPSULE | ORAL | 0 refills | Status: DC

## 2016-07-17 NOTE — Patient Instructions (Signed)
Good to see you  Katie Stanley is your friend.  Ice 20 minutes 2 times daily. Usually after activity and before bed. Once weekly vitamin D for 12 weeks.  Meloxicam daily for 10 days then as needed Gabapentin 200mg  at night Stay active.  2 injections today  See me again in 4 weeks an we will discuss if we need MRI.

## 2016-07-17 NOTE — Progress Notes (Signed)
Pre-visit discussion using our clinic review tool. No additional management support is needed unless otherwise documented below in the visit note.  
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
## 2016-07-24 ENCOUNTER — Telehealth: Payer: Self-pay | Admitting: Family Medicine

## 2016-07-24 MED ORDER — DICLOFENAC SODIUM 1 % TD CREA: 2.0000 g | TOPICAL_CREAM | Freq: Four times a day (QID) | TRANSDERMAL | 3 refills | Status: DC

## 2016-07-24 NOTE — Telephone Encounter (Signed)
Pt called in said that she thought she was suppose to have some kind of topical cream or patches called in ?

## 2016-07-24 NOTE — Telephone Encounter (Signed)
Patient notified of medication sent to pharmacy

## 2016-07-24 NOTE — Telephone Encounter (Signed)
Diclofenac Sodium 1% aka Voltaren sent to [facility] x3 refills

## 2016-08-13 NOTE — Progress Notes (Signed)
[name] [place], [place] [address] Phone: [phone] Subjective:    I'm seeing this patient by the request  of:  Valerie Roys, DO   CC: Chronic back pain f/u  HEN:[id number]  Katie Stanley is a 31 y.o. female coming in with complaint of chronic back pain. Patient was found to have some mild advanced degenerative disc disease patient states then that first information may be making some improvement with the gabapentin and the other medications. Patient states though that now the pain is just as bad as it was previously. Still very localized right at the thoracic lumbar junction she states. Can radiate down the leg. States that it can be unrelenting. Still worse in the mornings as well as late at night. States that she cannot even standing for long amount of timeout work without worsening over secondary to the discomfort. Some increase in intermittent pain going down the legs which is new.   patient did have x-rays of the thoracic spine and lumbar. Patient's x-rays taken March 2018 were independently visualized by me. 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  Past Medical History:  Diagnosis Date  . Anxiety   . Biliary calculi 12/07/2009  . Cholelithiasis   . Depression   . Hepatitis C   . Substance abuse    Past Surgical History:  Procedure Laterality Date  . CHOLECYSTECTOMY     Social History   Social History  . Marital status: Single    Spouse name: N/A  . Number of children: N/A  . Years of education: N/A   Social History Main Topics  . Smoking status: Current Every Day Smoker    Packs/day: 0.50    Types: Cigarettes  . Smokeless tobacco: Never Used  . Alcohol use No  . Drug use: Yes    Types: Marijuana  . Sexual activity: Yes    Birth control/ protection: Implant   Other Topics Concern  . None   Social History Narrative  . None   Allergies  Allergen Reactions    . Penicillins Swelling   Family History  Problem Relation Age of Onset  . Hypertension Mother   . Diabetes Father   . Cancer Maternal Grandmother     Liver  . Hypertension Maternal Grandfather   . Heart disease Paternal Grandfather     Past medical history, social, surgical and family history all reviewed in electronic medical record.  No pertanent information unless stated regarding to the chief complaint.   Review of Systems: No headache, visual changes, nausea, vomiting, diarrhea, constipation, dizziness, abdominal pain, skin rash, fevers, chills, night sweats, weight loss, swollen lymph nodes, chest pain, shortness of breath, mood changes.  Positive muscle aches and pains  Objective  Blood pressure 102/66, pulse 63, height 5\' 3"  (1.6 m), weight 205 lb (93 kg).   Systems examined below as of 08/14/16 General: NAD A&O x3 mood, affect normal  HEENT: Pupils equal, extraocular movements intact no nystagmus Respiratory: not short of breath at rest or with speaking Cardiovascular: No lower extremity edema, non tender Skin: Warm dry intact with no signs of infection or rash on extremities or on axial skeleton. Abdomen: Soft nontender, no masses Neuro: Cranial nerves  intact, neurovascularly intact in all extremities with 2+ DTRs and 2+ pulses. Lymph: No lymphadenopathy appreciated today  Gait normal with good balance and coordination.  MSK: Diffusely tender with full range of motion and good stability and symmetric  strength and tone of shoulders, elbows, wrist,  knee hips and ankles bilaterally.   Back Exam:  Inspection: Unremarkable  Motion: Flexion 35 deg, Extension 25 deg, Side Bending to 35 deg bilaterally,  Rotation to 35 deg bilaterally  SLR laying: Negative  XSLR laying: Negative  Palpable tenderness: Tender to palpation and appears palmar musculature mostly at the thoracolumbar juncture. Mild pain over the spinous process in the area.Marland Kitchen FABER: Tight bilaterally.. Sensory  change: Gross sensation intact to all lumbar and sacral dermatomes.  Reflexes: 2+ at both patellar tendons, 2+ at achilles tendons, Babinski's downgoing.  Strength at foot  4 out of 5 but symmetric      Impression and Recommendations:     This case required medical decision making of moderate complexity.      Note: This dictation was prepared with Dragon dictation along with smaller phrase technology. Any transcriptional errors that result from this process are unintentional.

## 2016-08-14 ENCOUNTER — Encounter: Payer: Self-pay | Admitting: Family Medicine

## 2016-08-14 ENCOUNTER — Ambulatory Visit (INDEPENDENT_AMBULATORY_CARE_PROVIDER_SITE_OTHER): Payer: Medicaid Other | Admitting: Family Medicine

## 2016-08-14 VITALS — BP 102/66 | HR 63 | Ht 63.0 in | Wt 205.0 lb

## 2016-08-14 DIAGNOSIS — G8929 Other chronic pain: Secondary | ICD-10-CM | POA: Diagnosis not present

## 2016-08-14 DIAGNOSIS — M549 Dorsalgia, unspecified: Secondary | ICD-10-CM | POA: Diagnosis not present

## 2016-08-14 DIAGNOSIS — M546 Pain in thoracic spine: Secondary | ICD-10-CM | POA: Diagnosis not present

## 2016-08-14 MED ORDER — NAPROXEN 500 MG PO TABS: 500.0000 mg | ORAL_TABLET | Freq: Two times a day (BID) | ORAL | 3 refills | Status: DC

## 2016-08-14 MED ORDER — GABAPENTIN 300 MG PO CAPS: ORAL_CAPSULE | ORAL | 3 refills | Status: DC

## 2016-08-14 MED ORDER — KETOROLAC TROMETHAMINE 60 MG/2ML IM SOLN
60.0000 mg | Freq: Once | INTRAMUSCULAR | Status: AC
  Administered 2016-08-14: 60 mg via INTRAMUSCULAR

## 2016-08-14 MED ORDER — METHYLPREDNISOLONE ACETATE 80 MG/ML IJ SUSP
80.0000 mg | Freq: Once | INTRAMUSCULAR | Status: AC
  Administered 2016-08-14: 80 mg via INTRAMUSCULAR

## 2016-08-14 NOTE — Assessment & Plan Note (Signed)
Patient is having pain from T10-L1. Discussed with patient at great length. I do believe the patient past medical history significant for drug abuse including IV general used there is a potential for a potential occult infection in the area. We discussed with patient about advanced imaging. Patient is failed all other conservative therapy for multiple other providers over the course of time. We will increase gabapentin to 300 mg, we discussed other medications including a different oral anti-inflammatory that was prescribed. Depending on findings patient may be a candidate for an epidural injection.

## 2016-08-14 NOTE — Patient Instructions (Signed)
Good to see you  Katie Stanley is your friend.  2 injections today  Gabapentin 300mg  at night  Naproxen 500mg  2 times a day  We will get MRI of thoracic and lumbar See me again in 4 weeks.

## 2016-08-31 ENCOUNTER — Other Ambulatory Visit: Payer: Medicaid Other

## 2016-09-06 ENCOUNTER — Ambulatory Visit: Payer: Medicaid Other | Admitting: Family Medicine

## 2016-09-18 ENCOUNTER — Other Ambulatory Visit: Payer: Medicaid Other

## 2016-09-26 ENCOUNTER — Encounter: Payer: Self-pay | Admitting: Family Medicine

## 2016-09-27 MED ORDER — TIZANIDINE HCL 4 MG PO TABS: ORAL_TABLET | ORAL | 0 refills | Status: DC

## 2016-09-29 ENCOUNTER — Ambulatory Visit
Admission: RE | Admit: 2016-09-29 | Discharge: 2016-09-29 | Disposition: A | Discharge status: Home / Self Care | Payer: Medicaid Other | Source: Ambulatory Visit | Attending: Family Medicine | Admitting: Family Medicine

## 2016-09-29 DIAGNOSIS — M549 Dorsalgia, unspecified: Secondary | ICD-10-CM

## 2016-10-16 ENCOUNTER — Other Ambulatory Visit: Payer: Self-pay | Admitting: Family Medicine

## 2016-10-18 ENCOUNTER — Encounter: Payer: Self-pay | Admitting: Family Medicine

## 2016-10-18 NOTE — Telephone Encounter (Signed)
Routing to provider  

## 2016-10-19 MED ORDER — OMEPRAZOLE 40 MG PO CPDR: DELAYED_RELEASE_CAPSULE | ORAL | 1 refills | Status: DC

## 2016-10-19 MED ORDER — CITALOPRAM HYDROBROMIDE 20 MG PO TABS: 30.0000 mg | ORAL_TABLET | Freq: Every day | ORAL | 3 refills | Status: DC

## 2016-10-25 ENCOUNTER — Other Ambulatory Visit: Payer: Self-pay | Admitting: Family Medicine

## 2016-12-26 ENCOUNTER — Encounter: Payer: Self-pay | Admitting: Unknown Physician Specialty

## 2016-12-26 ENCOUNTER — Other Ambulatory Visit: Payer: Self-pay

## 2016-12-26 ENCOUNTER — Ambulatory Visit (INDEPENDENT_AMBULATORY_CARE_PROVIDER_SITE_OTHER): Payer: Medicaid Other | Admitting: Unknown Physician Specialty

## 2016-12-26 VITALS — BP 119/80 | HR 70 | Temp 98.9°F | Wt 212.2 lb

## 2016-12-26 DIAGNOSIS — J0100 Acute maxillary sinusitis, unspecified: Secondary | ICD-10-CM

## 2016-12-26 DIAGNOSIS — R05 Cough: Secondary | ICD-10-CM

## 2016-12-26 DIAGNOSIS — B372 Candidiasis of skin and nail: Secondary | ICD-10-CM

## 2016-12-26 DIAGNOSIS — R059 Cough, unspecified: Secondary | ICD-10-CM

## 2016-12-26 LAB — VERITOR FLU A/B WAIVED
Influenza A: NEGATIVE
Influenza B: NEGATIVE

## 2016-12-26 MED ORDER — CLOTRIMAZOLE-BETAMETHASONE 1-0.05 % EX CREA: 1.0000 "application " | TOPICAL_CREAM | Freq: Two times a day (BID) | CUTANEOUS | 0 refills | Status: DC

## 2016-12-26 MED ORDER — IBUPROFEN 800 MG PO TABS: ORAL_TABLET | ORAL | 3 refills | Status: DC

## 2016-12-26 MED ORDER — DOXYCYCLINE HYCLATE 100 MG PO TABS: 100.0000 mg | ORAL_TABLET | Freq: Two times a day (BID) | ORAL | 0 refills | Status: DC

## 2016-12-26 MED ORDER — GUAIFENESIN-CODEINE 100-10 MG/5ML PO SOLN: 10.0000 mL | Freq: Three times a day (TID) | ORAL | 0 refills | Status: DC | PRN

## 2016-12-26 NOTE — Progress Notes (Signed)
BP 119/80   Pulse 70   Temp 98.9 F (37.2 C)   Wt 212 lb 3.2 oz (96.3 kg)   LMP 12/18/2016 (Approximate)   SpO2 98%   BMI 37.59 kg/m    Subjective:    Patient ID: Katie Stanley, female    DOB: 11-05-1985, 31 y.o.   MRN: [id number]  HPI: Katie Stanley is a 31 y.o. female  Chief Complaint  Patient presents with  . URI    pt states she has had congestion, sinus pressure, cough, chest congestion, and wheezing since last Wednesday    URI   This is a new problem. The current episode started in the past 7 days (Started last Wednesday). The problem has been gradually worsening. The maximum temperature recorded prior to her arrival was 101 - 101.9 F. The fever has been present for 1 to 2 days. Associated symptoms include congestion, coughing, headaches, a rash, sinus pain and a sore throat. She has tried NSAIDs and decongestant for the symptoms. The treatment provided no relief.   rash Between breasts for 3 days, itches  Relevant past medical, surgical, family and social history reviewed and updated as indicated. Interim medical history since our last visit reviewed. Allergies and medications reviewed and updated.  Review of Systems  HENT: Positive for congestion, sinus pain and sore throat.   Respiratory: Positive for cough.   Skin: Positive for rash.  Neurological: Positive for headaches.    Per HPI unless specifically indicated above     Objective:    BP 119/80   Pulse 70   Temp 98.9 F (37.2 C)   Wt 212 lb 3.2 oz (96.3 kg)   LMP 12/18/2016 (Approximate)   SpO2 98%   BMI 37.59 kg/m   Wt Readings from Last 3 Encounters:  12/26/16 212 lb 3.2 oz (96.3 kg)  08/14/16 205 lb (93 kg)  07/17/16 209 lb 8 oz (95 kg)    Physical Exam  Constitutional: She is oriented to person, place, and time. She appears well-developed and well-nourished. No distress.  HENT:  Head: Normocephalic and atraumatic.  Right Ear: Tympanic membrane and ear canal normal.  Left Ear:  Tympanic membrane and ear canal normal.  Nose: No rhinorrhea. Right sinus exhibits maxillary sinus tenderness. Right sinus exhibits no frontal sinus tenderness. Left sinus exhibits maxillary sinus tenderness. Left sinus exhibits no frontal sinus tenderness.  Eyes: Conjunctivae and lids are normal. Right eye exhibits no discharge. Left eye exhibits no discharge. No scleral icterus.  Cardiovascular: Normal rate and regular rhythm.   Pulmonary/Chest: Effort normal and breath sounds normal. No respiratory distress.  Abdominal: Normal appearance. There is no splenomegaly or hepatomegaly.  Musculoskeletal: Normal range of motion.  Neurological: She is alert and oriented to person, place, and time.  Skin: Skin is intact. No pallor.  Rash between breasts with erythema and raised border.    Psychiatric: She has a normal mood and affect. Her behavior is normal. Judgment and thought content normal.    Results for orders placed or performed during the hospital encounter of 06/07/16  Amylase  Result Value Ref Range   Amylase 45 28 - 100 U/L  Lipase, blood  Result Value Ref Range   Lipase 28 11 - 51 U/L  CBC with Differential/Platelet  Result Value Ref Range   WBC 7.3 3.6 - 11.0 K/uL   RBC 4.38 3.80 - 5.20 MIL/uL   Hemoglobin 12.7 12.0 - 16.0 g/dL   HCT 35.7 35.0 - 47.0 %  MCV 81.5 80.0 - 100.0 fL   MCH 29.0 26.0 - 34.0 pg   MCHC 35.5 32.0 - 36.0 g/dL   RDW 14.2 11.5 - 14.5 %   Platelets 266 150 - 440 K/uL   Neutrophils Relative % 54 %   Neutro Abs 3.9 1.4 - 6.5 K/uL   Lymphocytes Relative 33 %   Lymphs Abs 2.4 1.0 - 3.6 K/uL   Monocytes Relative 8 %   Monocytes Absolute 0.6 0.2 - 0.9 K/uL   Eosinophils Relative 4 %   Eosinophils Absolute 0.3 0 - 0.7 K/uL   Basophils Relative 1 %   Basophils Absolute 0.1 0 - 0.1 K/uL  Comprehensive metabolic panel  Result Value Ref Range   Sodium 138 135 - 145 mmol/L   Potassium 4.0 3.5 - 5.1 mmol/L   Chloride 102 101 - 111 mmol/L   CO2 28 22 - 32  mmol/L   Glucose, Bld 96 65 - 99 mg/dL   BUN 9 6 - 20 mg/dL   Creatinine, Ser 0.53 0.44 - 1.00 mg/dL   Calcium 8.8 (L) 8.9 - 10.3 mg/dL   Total Protein 7.1 6.5 - 8.1 g/dL   Albumin 3.8 3.5 - 5.0 g/dL   AST 24 15 - 41 U/L   ALT 21 14 - 54 U/L   Alkaline Phosphatase 74 38 - 126 U/L   Total Bilirubin 0.4 0.3 - 1.2 mg/dL   GFR calc non Af Amer >60 >60 mL/min   GFR calc Af Amer >60 >60 mL/min   Anion gap 8 5 - 15  Miscellaneous [facility] test (send-out)  Result Value Ref Range   [facility] test code [id number]    [facility] test name HEP C VIRUS RNA REAL TIME    Misc [facility] result COMMENT       Assessment & Plan:   Problem List Items Addressed This Visit    None    Visit Diagnoses    Acute non-recurrent maxillary sinusitis    -  Primary   Rx for Doxycycline.  Discussed self care   Relevant Medications   doxycycline (VIBRA-TABS) 100 MG tablet   guaiFENesin-codeine 100-10 MG/5ML syrup   Yeast dermatitis       Rx for Lotrisone cream.  Apply twice a day and emphasized keep area clean and dry   Relevant Medications   clotrimazole-betamethasone (LOTRISONE) cream   Cough       Codeine cough medication with mucinex       Follow up plan: Return if symptoms worsen or fail to improve.

## 2017-02-20 ENCOUNTER — Other Ambulatory Visit: Payer: Self-pay | Admitting: Family Medicine

## 2017-05-03 ENCOUNTER — Encounter: Payer: Self-pay | Admitting: *Deleted

## 2017-05-03 ENCOUNTER — Ambulatory Visit
Admission: EM | Admit: 2017-05-03 | Discharge: 2017-05-03 | Disposition: A | Discharge status: Home / Self Care | Payer: Medicaid Other | Attending: Family Medicine | Admitting: Family Medicine

## 2017-05-03 ENCOUNTER — Other Ambulatory Visit: Payer: Self-pay

## 2017-05-03 DIAGNOSIS — F419 Anxiety disorder, unspecified: Secondary | ICD-10-CM | POA: Insufficient documentation

## 2017-05-03 DIAGNOSIS — Z791 Long term (current) use of non-steroidal anti-inflammatories (NSAID): Secondary | ICD-10-CM | POA: Diagnosis not present

## 2017-05-03 DIAGNOSIS — F329 Major depressive disorder, single episode, unspecified: Secondary | ICD-10-CM | POA: Insufficient documentation

## 2017-05-03 DIAGNOSIS — Z8249 Family history of ischemic heart disease and other diseases of the circulatory system: Secondary | ICD-10-CM | POA: Insufficient documentation

## 2017-05-03 DIAGNOSIS — J019 Acute sinusitis, unspecified: Secondary | ICD-10-CM

## 2017-05-03 DIAGNOSIS — F1911 Other psychoactive substance abuse, in remission: Secondary | ICD-10-CM | POA: Insufficient documentation

## 2017-05-03 DIAGNOSIS — Z9049 Acquired absence of other specified parts of digestive tract: Secondary | ICD-10-CM | POA: Insufficient documentation

## 2017-05-03 DIAGNOSIS — Z800 Family history of malignant neoplasm of digestive organs: Secondary | ICD-10-CM | POA: Diagnosis not present

## 2017-05-03 DIAGNOSIS — F17210 Nicotine dependence, cigarettes, uncomplicated: Secondary | ICD-10-CM | POA: Insufficient documentation

## 2017-05-03 DIAGNOSIS — Z79899 Other long term (current) drug therapy: Secondary | ICD-10-CM | POA: Insufficient documentation

## 2017-05-03 DIAGNOSIS — J029 Acute pharyngitis, unspecified: Secondary | ICD-10-CM | POA: Insufficient documentation

## 2017-05-03 DIAGNOSIS — Z833 Family history of diabetes mellitus: Secondary | ICD-10-CM | POA: Insufficient documentation

## 2017-05-03 DIAGNOSIS — H9202 Otalgia, left ear: Secondary | ICD-10-CM | POA: Diagnosis not present

## 2017-05-03 DIAGNOSIS — B182 Chronic viral hepatitis C: Secondary | ICD-10-CM | POA: Insufficient documentation

## 2017-05-03 DIAGNOSIS — Z880 Allergy status to penicillin: Secondary | ICD-10-CM | POA: Insufficient documentation

## 2017-05-03 DIAGNOSIS — M546 Pain in thoracic spine: Secondary | ICD-10-CM | POA: Insufficient documentation

## 2017-05-03 LAB — RAPID STREP SCREEN (MED CTR MEBANE ONLY): STREPTOCOCCUS, GROUP A SCREEN (DIRECT): NEGATIVE

## 2017-05-03 MED ORDER — DOXYCYCLINE HYCLATE 100 MG PO CAPS: 100.0000 mg | ORAL_CAPSULE | Freq: Two times a day (BID) | ORAL | 0 refills | Status: DC

## 2017-05-03 NOTE — ED Provider Notes (Signed)
[facility]    CSN: [id number] Arrival date & time: 05/03/17  1622  History   Chief Complaint Chief Complaint  Patient presents with  . Sore Throat  . Nasal Congestion   HPI  32 year old female presents with the above complaints.  Patient states she been sick for the past 1.5 weeks.  She has had sore throat, congestion, sinus pressure and pain.  Associated ear pain (left). She reports discolored nasal discharge.  No improvement with over-the-counter treatment.  No fevers or chills.  No known exacerbating or relieving factors.  No other associated symptoms.  No other complaints at this time.  Past Medical History:  Diagnosis Date  . Anxiety   . Biliary calculi 12/07/2009  . Cholelithiasis   . Depression   . Hepatitis C   . Substance abuse [facility])     Patient Active Problem List   Diagnosis Date Noted  . Chronic bilateral thoracic back pain 06/15/2016  . History of drug abuse in remission 05/01/2016  . Anxiety 02/25/2015  . 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 Chronic hepatitis C virus infection ([place]) 07/07/2009   Past Surgical History:  Procedure Laterality Date  . CHOLECYSTECTOMY     OB History    No data available     Home Medications    Prior to Admission medications   Medication Sig Start Date End Date Taking? Authorizing Provider  citalopram (CELEXA) 20 MG tablet Take 1.5 tablets (30 mg total) by mouth daily. 10/19/16  Yes Johnson, Megan P, DO  ibuprofen (ADVIL,MOTRIN) 800 MG tablet TAKE 1 TABLET(800 MG) BY MOUTH EVERY 8 HOURS AS NEEDED 12/26/16  Yes Johnson, Megan P, DO  omeprazole (PRILOSEC) 40 MG capsule TAKE 1 CAPSULE(40 MG) BY MOUTH DAILY 10/19/16  Yes Johnson, Megan P, DO  PARAGARD INTRAUTERINE COPPER IU by Intrauterine route.   Yes [provider]  propranolol (INDERAL) 10 MG tablet TAKE 1 TABLET(10 MG) BY MOUTH THREE TIMES DAILY 02/20/17  Yes Johnson, Megan P, DO  tiZANidine (ZANAFLEX) 4 MG tablet TAKE 1 TABLET BY MOUTH THREE TIMES DAILY  AS NEEDED FOR MUSCLE SPASMS 02/20/17  Yes Johnson, Megan P, DO  Vitamin D, Ergocalciferol, (DRISDOL) 50000 units CAPS capsule TAKE 1 CAPSULE BY MOUTH EVERY 7 DAYS 10/26/16  Yes Hulan Saas M, DO  doxycycline (VIBRAMYCIN) 100 MG capsule Take 1 capsule (100 mg total) by mouth 2 (two) times daily. 05/03/17   Coral Spikes, DO  gabapentin (NEURONTIN) 300 MG capsule nightly Patient not taking: Reported on 12/26/2016 08/14/16   Lyndal Pulley, DO    Family History Family History  Problem Relation Age of Onset  . Hypertension Mother   . Diabetes Father   . Cancer Maternal Grandmother        Liver  . Hypertension Maternal Grandfather   . Heart disease Paternal Grandfather     Social History Social History   Tobacco Use  . Smoking status: Current Every Day Smoker    Packs/day: 0.50    Types: Cigarettes  . Smokeless tobacco: Never Used  Substance Use Topics  . Alcohol use: No  . Drug use: Yes    Types: Marijuana     Allergies   Penicillins   Review of Systems Review of Systems  Constitutional: Negative for fever.  HENT: Positive for congestion, sinus pressure and sore throat.   Respiratory: Positive for cough.    Physical Exam Triage Vital Signs ED Triage Vitals  Enc Vitals Group     BP 05/03/17 1636 129/77  Pulse Rate 05/03/17 1636 (!) 59     Resp 05/03/17 1636 16     Temp 05/03/17 1636 98.2 F (36.8 C)     Temp Source 05/03/17 1636 Oral     SpO2 05/03/17 1636 99 %     Weight 05/03/17 1638 210 lb (95.3 kg)     Height 05/03/17 1638 5\' 3"  (1.6 m)     Head Circumference --      Peak Flow --      Pain Score 05/03/17 1636 5     Pain Loc --      Pain Edu? --      Excl. in [place]? --    Updated Vital Signs BP 129/77 (BP Location: Left Arm)   Pulse (!) 59   Temp 98.2 F (36.8 C) (Oral)   Resp 16   Ht 5\' 3"  (1.6 m)   Wt 210 lb (95.3 kg)   LMP 04/10/2017   SpO2 99%   BMI 37.20 kg/m     Physical Exam  Constitutional: She is oriented to person, place, and time.  She appears well-developed and well-nourished. No distress.  HENT:  Head: Normocephalic and atraumatic.  Oropharynx with mild erythema.   Eyes: Conjunctivae are normal.  Neck: Neck supple.  Cardiovascular: Regular rhythm.  No murmur heard. Bradycardic.  Pulmonary/Chest: Effort normal and breath sounds normal. She has no wheezes. She has no rales.  Lymphadenopathy:    She has no cervical adenopathy.  Neurological: She is alert and oriented to person, place, and time.  Psychiatric: She has a normal mood and affect. Her behavior is normal.  Nursing note and vitals reviewed.  UC Treatments / Results  Labs (all labs ordered are listed, but only abnormal results are displayed) Labs Reviewed  RAPID STREP SCREEN (NOT AT [facility])  CULTURE, GROUP A STREP [facility])   EKG  EKG Interpretation None       Radiology No results found.  Procedures Procedures (including critical care time)  Medications Ordered in UC Medications - No data to display   Initial Impression / Assessment and Plan / UC Course  I have reviewed the triage vital signs and the nursing notes.  Pertinent labs & imaging results that were available during my care of the patient were reviewed by me and considered in my medical decision making (see chart for details).    32 year old female presents with sinusitis. Treating with Doxy.  Final Clinical Impressions(s) / UC Diagnoses   Final diagnoses:  Acute sinusitis, recurrence not specified, unspecified location    ED Discharge Orders        Ordered    doxycycline (VIBRAMYCIN) 100 MG capsule  2 times daily     05/03/17 1721     Controlled Substance Prescriptions [place] Controlled Substance Registry consulted? Not Applicable   Coral Spikes, DO 05/03/17 1740

## 2017-05-03 NOTE — ED Triage Notes (Signed)
Patient started having symptom of nasal congestion / drainage, and headache 2 weeks ago. Symptoms of sore throat and left side ear pain started 3 days ago.

## 2017-05-06 LAB — CULTURE, GROUP A STREP (THRC)

## 2017-05-23 ENCOUNTER — Other Ambulatory Visit: Payer: Self-pay

## 2017-05-23 ENCOUNTER — Encounter: Payer: Self-pay | Admitting: *Deleted

## 2017-05-23 ENCOUNTER — Ambulatory Visit
Admission: EM | Admit: 2017-05-23 | Discharge: 2017-05-23 | Disposition: A | Discharge status: Home / Self Care | Payer: Medicaid Other | Attending: Family Medicine | Admitting: Family Medicine

## 2017-05-23 DIAGNOSIS — J069 Acute upper respiratory infection, unspecified: Secondary | ICD-10-CM

## 2017-05-23 DIAGNOSIS — B349 Viral infection, unspecified: Secondary | ICD-10-CM | POA: Diagnosis not present

## 2017-05-23 MED ORDER — PSEUDOEPHEDRINE HCL 60 MG PO TABS: 60.0000 mg | ORAL_TABLET | Freq: Four times a day (QID) | ORAL | 0 refills | Status: DC | PRN

## 2017-05-23 MED ORDER — FLUTICASONE PROPIONATE 50 MCG/ACT NA SUSP: 2.0000 | Freq: Every day | NASAL | 0 refills | Status: DC

## 2017-05-23 NOTE — ED Triage Notes (Signed)
Patient started having bilateral ear pain 3 days ago.

## 2017-05-23 NOTE — ED Provider Notes (Signed)
[facility]    CSN: [id number] Arrival date & time: 05/23/17  1537  History   Chief Complaint Chief Complaint  Patient presents with  . Otalgia   HPI  32 year old female presents with the above complaints.  Patient states she is been sick for the past 3 days.  She has had otalgia, congestion, cough.  No fevers or chills.  Pain is currently 7/10 in severity.  Her son is also sick.  No known exacerbating relieving factors.  No other reported symptoms.  No other complaints at this time.  Past Medical History:  Diagnosis Date  . Anxiety   . Biliary calculi 12/07/2009  . Cholelithiasis   . Depression   . Hepatitis C   . Substance abuse [facility])     Patient Active Problem List   Diagnosis Date Noted  . Chronic bilateral thoracic back pain 06/15/2016  . History of drug abuse in remission 05/01/2016  . Anxiety 02/25/2015  . 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 Chronic hepatitis C virus infection ([place]) 07/07/2009    Past Surgical History:  Procedure Laterality Date  . CHOLECYSTECTOMY      OB History    No data available       Home Medications    Prior to Admission medications   Medication Sig Start Date End Date Taking? Authorizing Provider  citalopram (CELEXA) 20 MG tablet Take 1.5 tablets (30 mg total) by mouth daily. 10/19/16  Yes Johnson, Megan P, DO  ibuprofen (ADVIL,MOTRIN) 800 MG tablet TAKE 1 TABLET(800 MG) BY MOUTH EVERY 8 HOURS AS NEEDED 12/26/16  Yes Johnson, Megan P, DO  PARAGARD INTRAUTERINE COPPER IU by Intrauterine route.   Yes [provider]  propranolol (INDERAL) 10 MG tablet TAKE 1 TABLET(10 MG) BY MOUTH THREE TIMES DAILY 02/20/17  Yes Johnson, Megan P, DO  tiZANidine (ZANAFLEX) 4 MG tablet TAKE 1 TABLET BY MOUTH THREE TIMES DAILY AS NEEDED FOR MUSCLE SPASMS 02/20/17  Yes Johnson, Megan P, DO  fluticasone (FLONASE) 50 MCG/ACT nasal spray Place 2 sprays into both nostrils daily. 05/23/17   Coral Spikes, DO  pseudoephedrine (SUDAFED) 60 MG  tablet Take 1 tablet (60 mg total) by mouth every 6 (six) hours as needed for congestion. 05/23/17   Coral Spikes, DO    Family History Family History  Problem Relation Age of Onset  . Hypertension Mother   . Diabetes Father   . Cancer Maternal Grandmother        Liver  . Hypertension Maternal Grandfather   . Heart disease Paternal Grandfather     Social History Social History   Tobacco Use  . Smoking status: Current Every Day Smoker    Packs/day: 0.50    Types: Cigarettes  . Smokeless tobacco: Never Used  Substance Use Topics  . Alcohol use: No  . Drug use: Yes    Types: Marijuana     Allergies   Penicillins   Review of Systems Review of Systems  HENT: Positive for congestion and ear pain.   Respiratory: Positive for cough.    Physical Exam Triage Vital Signs ED Triage Vitals  Enc Vitals Group     BP 05/23/17 1557 122/72     Pulse Rate 05/23/17 1557 62     Resp 05/23/17 1557 16     Temp 05/23/17 1557 98.1 F (36.7 C)     Temp src --      SpO2 05/23/17 1557 97 %     Weight --  Height --      Head Circumference --      Peak Flow --      Pain Score 05/23/17 1559 7     Pain Loc --      Pain Edu? --      Excl. in [place]? --    Updated Vital Signs BP 122/72 (BP Location: Left Arm)   Pulse 62   Temp 98.1 F (36.7 C)   Resp 16   LMP 05/23/2017   SpO2 97%     Physical Exam  Constitutional: She is oriented to person, place, and time. She appears well-developed. No distress.  HENT:  Mouth/Throat: Oropharynx is clear and moist.  Normal TMs bilaterally.  Cardiovascular: Normal rate and regular rhythm.  Pulmonary/Chest: Effort normal and breath sounds normal.  Neurological: She is alert and oriented to person, place, and time.  Psychiatric: She has a normal mood and affect. Her behavior is normal.  Nursing note and vitals reviewed.  UC Treatments / Results  Labs (all labs ordered are listed, but only abnormal results are displayed) Labs Reviewed -  No data to display  EKG  EKG Interpretation None       Radiology No results found.  Procedures Procedures (including critical care time)  Medications Ordered in UC Medications - No data to display   Initial Impression / Assessment and Plan / UC Course  I have reviewed the triage vital signs and the nursing notes.  Pertinent labs & imaging results that were available during my care of the patient were reviewed by me and considered in my medical decision making (see chart for details).    32 year old female presents with viral URI. OTC Flonase and sudafed. Supportive care.  Final Clinical Impressions(s) / UC Diagnoses   Final diagnoses:  Viral upper respiratory tract infection    ED Discharge Orders        Ordered    fluticasone (FLONASE) 50 MCG/ACT nasal spray  Daily     05/23/17 1629    pseudoephedrine (SUDAFED) 60 MG tablet  Every 6 hours PRN     05/23/17 1629     Controlled Substance Prescriptions [place] Controlled Substance Registry consulted? Not Applicable   Coral Spikes, DO 05/23/17 1644

## 2017-06-25 ENCOUNTER — Encounter: Payer: Self-pay | Admitting: *Deleted

## 2017-06-25 ENCOUNTER — Ambulatory Visit
Admission: EM | Admit: 2017-06-25 | Discharge: 2017-06-25 | Disposition: A | Discharge status: Home / Self Care | Payer: Medicaid Other | Attending: Family Medicine | Admitting: Family Medicine

## 2017-06-25 ENCOUNTER — Other Ambulatory Visit: Payer: Self-pay

## 2017-06-25 DIAGNOSIS — Z79899 Other long term (current) drug therapy: Secondary | ICD-10-CM | POA: Insufficient documentation

## 2017-06-25 DIAGNOSIS — J029 Acute pharyngitis, unspecified: Secondary | ICD-10-CM | POA: Diagnosis present

## 2017-06-25 DIAGNOSIS — F17210 Nicotine dependence, cigarettes, uncomplicated: Secondary | ICD-10-CM | POA: Diagnosis not present

## 2017-06-25 DIAGNOSIS — J020 Streptococcal pharyngitis: Secondary | ICD-10-CM | POA: Insufficient documentation

## 2017-06-25 LAB — RAPID STREP SCREEN (MED CTR MEBANE ONLY): STREPTOCOCCUS, GROUP A SCREEN (DIRECT): POSITIVE — AB

## 2017-06-25 MED ORDER — LIDOCAINE VISCOUS 2 % MT SOLN: OROMUCOSAL | 0 refills | Status: DC

## 2017-06-25 MED ORDER — AZITHROMYCIN 250 MG PO TABS: ORAL_TABLET | ORAL | 0 refills | Status: DC

## 2017-06-25 NOTE — ED Triage Notes (Signed)
PAtient started having symptom of sore throat 1 week ago.

## 2017-06-25 NOTE — ED Provider Notes (Signed)
[facility]    CSN: [id number] Arrival date & time: 06/25/17  1511     History   Chief Complaint Chief Complaint  Patient presents with  . Sore Throat    HPI Katie Stanley is a 32 y.o. female.   The history is provided by the patient.  Sore Throat   URI  Presenting symptoms: fever and sore throat   Severity:  Moderate Onset quality:  Sudden Duration:  6 days Timing:  Constant Progression:  Worsening Chronicity:  New Relieved by:  Nothing Ineffective treatments:  OTC medications Associated symptoms: no myalgias, no sinus pain and no wheezing   Risk factors: not elderly, no chronic cardiac disease, no chronic kidney disease, no chronic respiratory disease, no diabetes mellitus, no immunosuppression, no recent illness and no recent travel     Past Medical History:  Diagnosis Date  . Anxiety   . Biliary calculi 12/07/2009  . Cholelithiasis   . Depression   . Hepatitis C   . Substance abuse [facility])     Patient Active Problem List   Diagnosis Date Noted  . Chronic bilateral thoracic back pain 06/15/2016  . History of drug abuse in remission 05/01/2016  . Anxiety 02/25/2015  . 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 Chronic hepatitis C virus infection ([place]) 07/07/2009    Past Surgical History:  Procedure Laterality Date  . CHOLECYSTECTOMY      OB History    No data available       Home Medications    Prior to Admission medications   Medication Sig Start Date End Date Taking? Authorizing Provider  azithromycin (ZITHROMAX Z-PAK) 250 MG tablet 2 tabs po once day 1, then 1 tab po qd for next 4 days 06/25/17   Norval Gable, MD  citalopram (CELEXA) 20 MG tablet Take 1.5 tablets (30 mg total) by mouth daily. 10/19/16   Johnson, Megan P, DO  fluticasone (FLONASE) 50 MCG/ACT nasal spray Place 2 sprays into both nostrils daily. 05/23/17   Coral Spikes, DO  ibuprofen (ADVIL,MOTRIN) 800 MG tablet TAKE 1 TABLET(800 MG) BY MOUTH EVERY 8 HOURS AS NEEDED  12/26/16   Park Liter P, DO  lidocaine (XYLOCAINE) 2 % solution 20 ml gargle and spit q 6 hours prn sore throat 06/25/17   Norval Gable, MD  [facility] INTRAUTERINE COPPER IU by Intrauterine route.    [provider]  propranolol (INDERAL) 10 MG tablet TAKE 1 TABLET(10 MG) BY MOUTH THREE TIMES DAILY 02/20/17   Wynetta Emery, Megan P, DO  pseudoephedrine (SUDAFED) 60 MG tablet Take 1 tablet (60 mg total) by mouth every 6 (six) hours as needed for congestion. 05/23/17   Coral Spikes, DO  tiZANidine (ZANAFLEX) 4 MG tablet TAKE 1 TABLET BY MOUTH THREE TIMES DAILY AS NEEDED FOR MUSCLE SPASMS 02/20/17   Valerie Roys, DO    Family History Family History  Problem Relation Age of Onset  . Hypertension Mother   . Diabetes Father   . Cancer Maternal Grandmother        Liver  . Hypertension Maternal Grandfather   . Heart disease Paternal Grandfather     Social History Social History   Tobacco Use  . Smoking status: Current Every Day Smoker    Packs/day: 0.50    Types: Cigarettes  . Smokeless tobacco: Never Used  Substance Use Topics  . Alcohol use: No  . Drug use: Yes    Types: Marijuana     Allergies   Penicillins   Review  of Systems Review of Systems  Constitutional: Positive for fever.  HENT: Positive for sore throat. Negative for sinus pain.   Respiratory: Negative for wheezing.   Musculoskeletal: Negative for myalgias.     Physical Exam Triage Vital Signs ED Triage Vitals  Enc Vitals Group     BP 06/25/17 1523 (!) 143/88     Pulse Rate 06/25/17 1523 78     Resp 06/25/17 1523 18     Temp 06/25/17 1523 99.4 F (37.4 C)     Temp Source 06/25/17 1523 Oral     SpO2 06/25/17 1523 100 %     Weight --      Height --      Head Circumference --      Peak Flow --      Pain Score 06/25/17 1524 8     Pain Loc --      Pain Edu? --      Excl. in [place]? --    No data found.  Updated Vital Signs BP (!) 143/88 (BP Location: Left Arm)   Pulse 78   Temp 99.4 F  (37.4 C) (Oral)   Resp 18   LMP 06/19/2017   SpO2 100%   Visual Acuity Right Eye Distance:   Left Eye Distance:   Bilateral Distance:    Right Eye Near:   Left Eye Near:    Bilateral Near:     Physical Exam  Constitutional: She appears well-developed and well-nourished. No distress.  HENT:  Head: Normocephalic and atraumatic.  Right Ear: Tympanic membrane, external ear and ear canal normal.  Left Ear: Tympanic membrane, external ear and ear canal normal.  Nose: Mucosal edema and rhinorrhea present. No nose lacerations, sinus tenderness, nasal deformity, septal deviation or nasal septal hematoma. No epistaxis.  No foreign bodies. Right sinus exhibits maxillary sinus tenderness and frontal sinus tenderness. Left sinus exhibits maxillary sinus tenderness and frontal sinus tenderness.  Mouth/Throat: Uvula is midline and mucous membranes are normal. No oral lesions. No uvula swelling. Posterior oropharyngeal erythema present. No oropharyngeal exudate, posterior oropharyngeal edema or tonsillar abscesses.  Eyes: Conjunctivae and EOM are normal. Pupils are equal, round, and reactive to light. Right eye exhibits no discharge. Left eye exhibits no discharge. No scleral icterus.  Neck: Normal range of motion. Neck supple. No thyromegaly present.  Cardiovascular: Normal rate, regular rhythm and normal heart sounds.  Pulmonary/Chest: Effort normal and breath sounds normal. No respiratory distress. She has no wheezes. She has no rales.  Lymphadenopathy:    She has no cervical adenopathy.  Skin: She is not diaphoretic.  Nursing note and vitals reviewed.    UC Treatments / Results  Labs (all labs ordered are listed, but only abnormal results are displayed) Labs Reviewed  RAPID STREP SCREEN (NOT AT [facility]) - Abnormal; Notable for the following components:      Result Value   Streptococcus, Group A Screen (Direct) POSITIVE (*)    All other components within normal limits    EKG  EKG  Interpretation None       Radiology No results found.  Procedures Procedures (including critical care time)  Medications Ordered in UC Medications - No data to display   Initial Impression / Assessment and Plan / UC Course  I have reviewed the triage vital signs and the nursing notes.  Pertinent labs & imaging results that were available during my care of the patient were reviewed by me and considered in my medical decision making (see chart for details).  Final Clinical Impressions(s) / UC Diagnoses   Final diagnoses:  Strep throat    ED Discharge Orders        Ordered    azithromycin (ZITHROMAX Z-PAK) 250 MG tablet     06/25/17 1604    lidocaine (XYLOCAINE) 2 % solution     06/25/17 1604     1. Lab results and diagnosis reviewed with patient 2. rx as per orders above; reviewed possible side effects, interactions, risks and benefits  3. Recommend supportive treatment with rest, fluids, otc analgesics 4. Follow-up prn if symptoms worsen or don't improve  Controlled Substance Prescriptions [place] Controlled Substance Registry consulted? Not Applicable   Norval Gable, MD 06/25/17 [phone]

## 2017-11-10 ENCOUNTER — Other Ambulatory Visit: Payer: Self-pay | Admitting: Family Medicine

## 2017-11-12 ENCOUNTER — Other Ambulatory Visit: Payer: Self-pay | Admitting: Family Medicine

## 2017-11-12 NOTE — Telephone Encounter (Signed)
Req. For refill on Tizanidine; [facility] 02/20/17; #270; no RF   Last office visit; 12/26/16 for acute                           01/06/16 for physical  PCP Dr. Wynetta Emery  Pharmacy [facility] in [place]  Please review/advise on refill

## 2017-11-12 NOTE — Telephone Encounter (Signed)
Called pt to set up appointment voicemail not set up

## 2017-11-20 ENCOUNTER — Encounter: Payer: Self-pay | Admitting: Family Medicine

## 2017-11-20 ENCOUNTER — Ambulatory Visit: Payer: Medicaid Other | Admitting: Family Medicine

## 2017-11-20 VITALS — BP 120/75 | HR 73 | Temp 98.2°F | Ht 63.0 in | Wt 220.5 lb

## 2017-11-20 DIAGNOSIS — F329 Major depressive disorder, single episode, unspecified: Secondary | ICD-10-CM

## 2017-11-20 DIAGNOSIS — M546 Pain in thoracic spine: Secondary | ICD-10-CM

## 2017-11-20 DIAGNOSIS — G8929 Other chronic pain: Secondary | ICD-10-CM

## 2017-11-20 MED ORDER — TIZANIDINE HCL 4 MG PO TABS: ORAL_TABLET | ORAL | 0 refills | Status: DC

## 2017-11-20 MED ORDER — IBUPROFEN 800 MG PO TABS: ORAL_TABLET | ORAL | 3 refills | Status: DC

## 2017-11-20 MED ORDER — CITALOPRAM HYDROBROMIDE 20 MG PO TABS: 30.0000 mg | ORAL_TABLET | Freq: Every day | ORAL | 3 refills | Status: DC

## 2017-11-20 NOTE — Progress Notes (Signed)
   BP 120/75 (BP Location: Right Arm, Patient Position: Sitting, Cuff Size: Normal)   Pulse 73   Temp 98.2 F (36.8 C) (Oral)   Ht 5\' 3"  (1.6 m)   Wt 220 lb 8 oz (100 kg)   SpO2 96%   BMI 39.06 kg/m    Subjective:    Patient ID: Katie Stanley, female    DOB: [birth year], 32 y.o.   MRN: [id number]  HPI: Katie Stanley is a 32 y.o. female  Chief Complaint  Patient presents with  . Medication Refill    Patient states she needs a refill on her tizanidine and citalopram.    Pt here today for follow up depression and chronic back pain with spasms. No concerns today, well controlled per pt. Denies SI/HI, severe mood swings, appetite or sleep issues. Compliant with celexa regimen without side effects.  Zanaflex and ibuprofen doing well for her prn for her back pain. No incontinence, numbness or tingling down legs.   Relevant past medical, surgical, family and social history reviewed and updated as indicated. Interim medical history since our last visit reviewed. Allergies and medications reviewed and updated.  Review of Systems  Per HPI unless specifically indicated above     Objective:    BP 120/75 (BP Location: Right Arm, Patient Position: Sitting, Cuff Size: Normal)   Pulse 73   Temp 98.2 F (36.8 C) (Oral)   Ht 5\' 3"  (1.6 m)   Wt 220 lb 8 oz (100 kg)   SpO2 96%   BMI 39.06 kg/m   Wt Readings from Last 3 Encounters:  11/20/17 220 lb 8 oz (100 kg)  05/03/17 210 lb (95.3 kg)  12/26/16 212 lb 3.2 oz (96.3 kg)    Physical Exam  Constitutional: She is oriented to person, place, and time. She appears well-developed and well-nourished. No distress.  HENT:  Head: Atraumatic.  Eyes: Conjunctivae and EOM are normal.  Neck: Normal range of motion. Neck supple.  Cardiovascular: Normal rate and regular rhythm.  Pulmonary/Chest: Effort normal and breath sounds normal.  Musculoskeletal: Normal range of motion.  Neurological: She is alert and oriented to person, place, and  time.  Skin: Skin is warm and dry.  Psychiatric: She has a normal mood and affect. Her behavior is normal.  Nursing note and vitals reviewed.   Results for orders placed or performed during the hospital encounter of 06/25/17  Rapid strep screen  Result Value Ref Range   Streptococcus, Group A Screen (Direct) POSITIVE (A) NEGATIVE      Assessment & Plan:   Problem List Items Addressed This Visit      Other   Reactive depression - Primary    Stable on celexa at 1.5 tabs daily. Continue current regimen      Relevant Medications   citalopram (CELEXA) 20 MG tablet   Chronic bilateral thoracic back pain    Stable on zanaflex and ibuprofen prn. Continue current regimen      Relevant Medications   citalopram (CELEXA) 20 MG tablet   tiZANidine (ZANAFLEX) 4 MG tablet   ibuprofen (ADVIL,MOTRIN) 800 MG tablet       Follow up plan: Return in about 6 months (around 05/23/2018) for CPE.

## 2017-11-22 NOTE — Assessment & Plan Note (Signed)
Stable on celexa at 1.5 tabs daily. Continue current regimen

## 2017-11-22 NOTE — Assessment & Plan Note (Signed)
Stable on zanaflex and ibuprofen prn. Continue current regimen

## 2017-11-22 NOTE — Patient Instructions (Addendum)
Follow up for CPE 

## 2017-12-20 ENCOUNTER — Other Ambulatory Visit: Payer: Self-pay | Admitting: Surgery

## 2017-12-20 ENCOUNTER — Encounter: Payer: Self-pay | Admitting: Surgery

## 2017-12-20 ENCOUNTER — Ambulatory Visit: Payer: Medicaid Other | Admitting: Surgery

## 2017-12-20 VITALS — BP 134/89 | HR 71 | Temp 97.8°F | Ht 61.0 in | Wt 222.0 lb

## 2017-12-20 DIAGNOSIS — K921 Melena: Secondary | ICD-10-CM

## 2017-12-20 DIAGNOSIS — K219 Gastro-esophageal reflux disease without esophagitis: Secondary | ICD-10-CM | POA: Diagnosis not present

## 2017-12-20 MED ORDER — OMEPRAZOLE 40 MG PO CPDR: 40.0000 mg | DELAYED_RELEASE_CAPSULE | Freq: Every day | ORAL | 1 refills | Status: DC

## 2017-12-20 MED ORDER — LIDOCAINE 5 % EX CREA: 1.0000 | TOPICAL_CREAM | Freq: Two times a day (BID) | CUTANEOUS | 1 refills | Status: DC

## 2017-12-20 NOTE — Patient Instructions (Addendum)
Please give [place] a call after you have seen Dr. Allen Norris so we could discuss further in reference to your hemorrhoids.  For your abdominal pain, please take Tylenol.   For your hemorrhoids, please use the cream.   Disposable Sitz Bath A disposable sitz bath is a plastic basin that fits over the toilet. A bag is hung above the toilet, and the bag is connected to a tube that opens into the basin. The bag is filled with warm water that flows into the basin through the tube. A sitz bath can be used to help relieve symptoms, clean, and promote healing in the genital and anal areas, as well as in the lower abdomen and buttocks. What are the risks? Sitz baths are generally very safe. It is possible for the skin between the genitals and the anus (perineum) to become infected, but this is rare. You can avoid this by cleaning your sitz bath supplies thoroughly. How to use a disposable sitz bath 1. Close the clamp on the tube. Make sure the clamp is closed tightly to prevent leakage. 2. Fill the sitz bath basin and the plastic bag with warm water. The water should be warm enough to be comfortable, but not hot. 3. Raise the toilet seat and place the filled basin on the toilet. Make sure the overflow opening is facing toward the back of the toilet. ? If you prefer, you may place the empty basin on the toilet first, and then use the plastic bag to fill the basin with warm water. 4. Hang the filled plastic bag overhead on a hook or towel rack close to the toilet. The bag should be higher than the toilet so that the water will flow down through the tube. 5. Attach the tube to the opening on the basin. Make sure that the tube is attached to the basin tightly to prevent leakage. 6. Sit on the basin and release the clamp. This will allow warm water to flow into the basin and flush the area around your genitals and anus. 7. Remain sitting on the basin for about 15-20 minutes, or as long as told by your health care  provider. 8. Stand up and gently pat your skin dry. If directed, apply clean bandages (dressings) to the affected area as told by your health care provider. 9. Carefully remove the basin from the toilet seat and tip the basin into the toilet to empty any remaining water. Empty any remaining water from the plastic bag into the toilet. Then, flush the toilet. 10. Wash the basin with warm water and soap. Let the basin air dry in the sink. You should also let the plastic bag and the tubing air dry. 11. Store the basin, tubing, and plastic bag in a clean, dry area. 12. Wash your hands with soap and water. If soap and water are not available, use hand sanitizer. Contact a health care provider if:  You have symptoms that get worse instead of better.  You develop new skin irritation, redness, or swelling around your genitals or anus. This information is not intended to replace advice given to you by your health care provider. Make sure you discuss any questions you have with your health care provider. Document Released: 09/26/2011 Document Revised: 09/02/2015 Document Reviewed: 02/14/2015 Elsevier Interactive Patient Education  2018 [employer].  Hemorrhoids Hemorrhoids are swollen veins in and around the rectum or anus. Hemorrhoids can cause pain, itching, or bleeding. Most of the time, they do not cause serious problems. They  usually get better with diet changes, lifestyle changes, and other home treatments. Follow these instructions at home: Eating and drinking  Eat foods that have fiber, such as whole grains, beans, nuts, fruits, and vegetables. Ask your doctor about taking products that have added fiber (fibersupplements).  Drink enough fluid to keep your pee (urine) clear or pale yellow. For Pain and Swelling  Take a warm-water bath (sitz bath) for 20 minutes to ease pain. Do this 3-4 times a day.  If directed, put ice on the painful area. It may be helpful to use ice between your warm  baths. ? Put ice in a plastic bag. ? Place a towel between your skin and the bag. ? Leave the ice on for 20 minutes, 2-3 times a day. General instructions  Take over-the-counter and prescription medicines only as told by your doctor. ? Medicated creams and medicines that are inserted into the anus (suppositories) may be used or applied as told.  Exercise often.  Go to the bathroom when you have the urge to poop (to have a bowel movement). Do not wait.  Avoid pushing too hard (straining) when you poop.  Keep the butt area dry and clean. Use wet toilet paper or moist paper towels.  Do not sit on the toilet for a long time. Contact a doctor if:  You have any of these: ? Pain and swelling that do not get better with treatment or medicine. ? Bleeding that will not stop. ? Trouble pooping or you cannot poop. ? Pain or swelling outside the area of the hemorrhoids. This information is not intended to replace advice given to you by your health care provider. Make sure you discuss any questions you have with your health care provider. Document Released: 01/04/2008 Document Revised: 09/02/2015 Document Reviewed: 12/09/2014 Elsevier Interactive Patient Education  2018 [employer].   [facility] Content in Foods See the following list for the dietary fiber content of some common foods. High-fiber foods High-fiber foods contain 4 grams or more (4g or more) of fiber per serving. They include:  Artichoke (fresh) - 1 medium has 10.3g of fiber.  Baked beans, plain or vegetarian (canned) -  cup has 5.2g of fiber.  Blackberries or raspberries (fresh) -  cup has 4g of fiber.  Bran cereal -  cup has 8.6g of fiber.  Bulgur (cooked) -  cup has 4g of fiber.  Kidney beans (canned) -  cup has 6.8g of fiber.  Lentils (cooked) -  cup has 7.8g of fiber.  Pear (fresh) - 1 medium has 5.1g of fiber.  Peas (frozen) -  cup has 4.4g of fiber.  Pinto beans (canned) -  cup has 5.5g of  fiber.  Pinto beans (dried and cooked) -  cup has 7.7g of fiber.  Potato with skin (baked) - 1 medium has 4.4g of fiber.  Quinoa (cooked) -  cup has 5g of fiber.  Soybeans (canned, frozen, or fresh) -  cup has 5.1g of fiber.  Moderate-fiber foods Moderate-fiber foods contain 1-4 grams (1-4g) of fiber per serving. They include:  Almonds - 1 oz. has 3.5g of fiber.  Apple with skin - 1 medium has 3.3g of fiber.  Applesauce, sweetened -  cup has 1.5g of fiber.  Bagel, plain - one 4-inch (10-cm) bagel has 2g of fiber.  Banana - 1 medium has 3.1g of fiber.  Broccoli (cooked) -  cup has 2.5g of fiber.  Carrots (cooked) -  cup has 2.3g of fiber.  Corn (canned or  frozen) -  cup has 2.1g of fiber.  Corn tortilla - one 6-inch (15-cm) tortilla has 1.5g of fiber.  Green beans (canned) -  cup has 2g of fiber.  Instant oatmeal -  cup has about 2g of fiber.  Long-grain brown rice (cooked) - 1 cup has 3.5g of fiber.  Macaroni, enriched (cooked) - 1 cup has 2.5g of fiber.  Melon - 1 cup has 1.4g of fiber.  Multigrain cereal -  cup has about 2-4g of fiber.  Orange - 1 small has 3.1g of fiber.  Potatoes, mashed -  cup has 1.6g of fiber.  Raisins - 1/4 cup has 1.6g of fiber.  Squash -  cup has 2.9g of fiber.  Sunflower seeds -  cup has 1.1g of fiber.  Tomato - 1 medium has 1.5g of fiber.  Vegetable or soy patty - 1 has 3.4g of fiber.  Whole-wheat bread - 1 slice has 2g of fiber.  Whole-wheat spaghetti -  cup has 3.2g of fiber.  Low-fiber foods Low-fiber foods contain less than 1 gram (less than 1g) of fiber per serving. They include:  Egg - 1 large.  Flour tortilla - one 6-inch (15-cm) tortilla.  Fruit juice -  cup.  Lettuce - 1 cup.  Meat, poultry, or fish - 1 oz.  Milk - 1 cup.  Spinach (raw) - 1 cup.  White bread - 1 slice.  White rice -  cup.  Yogurt -  cup.  Actual amounts of fiber in foods may be different depending on processing.  Talk with your dietitian about how much fiber you need in your diet. This information is not intended to replace advice given to you by your health care provider. Make sure you discuss any questions you have with your health care provider. Document Released: 08/13/2006 Document Revised: 09/02/2015 Document Reviewed: 05/20/2015 Elsevier Interactive Patient Education  2018 [employer].

## 2017-12-20 NOTE — Progress Notes (Signed)
Surgical Clinic History and Physical  Referring provider:  Valerie Roys, DO [place], [place] [address]  HISTORY OF PRESENT ILLNESS (HPI):  32 y.o. female presents for evaluation of hemorrhoids and epigastric abdominal pain. Patient describes her pain is worse at night, when she lays down, and she reports a long history of reflux, for which she was prescribed omeprazole, which relieved her symptoms, but patient self-discontinued the medication after 5 - 6 months, because she "didn't like the way it made [her] feel". She sometimes eats before laying down in bed at night and typically takes prescription-strength 800 mg ibuprofen for her lower back pain, particularly to help her sleep. Patient, however, says her lower back pain has also improved with PT.   Regarding patient's hemorrhoids, patient denies any peri-anal bulge or sensation of incomplete evacuation, but rather describes circumferential peri-anal pain x 8 years. She describes a childhood history of severe constipation that persisted until she recently has been experiencing loose BM's, often immediately after eating and with small amounts of blood on the toilet paper after wiping. She is uncertain whether her loose BM's began following cholecystectomy at [facility] (2008) for what she describes as performed 36 hours after admission for a gallstone stuck in the gallbladder neck. She otherwise denies recent unintentional weight loss, fever/chills, N/V, or family history of colon cancer and previously saw Dr. Allen Norris (2017) for her hepatitis C, which she says self-resolved without treatment. Patient also denies any CP or SOB.  PAST MEDICAL HISTORY (PMH):  Past Medical History:  Diagnosis Date  . Anxiety   . Biliary calculi 12/07/2009  . Cholelithiasis   . Depression   . Hepatitis C   . Substance abuse ([place])      PAST SURGICAL HISTORY ([place]):  Past Surgical History:  Procedure Laterality Date  . CHOLECYSTECTOMY       MEDICATIONS:   Prior to Admission medications   Medication Sig Start Date End Date Taking? Authorizing Provider  citalopram (CELEXA) 20 MG tablet Take 1.5 tablets (30 mg total) by mouth daily. 11/20/17  Yes Volney American, PA-C  ibuprofen (ADVIL,MOTRIN) 800 MG tablet TAKE 1 TABLET(800 MG) BY MOUTH EVERY 8 HOURS AS NEEDED 11/20/17  Yes Volney American, PA-C  [facility] INTRAUTERINE COPPER IU by Intrauterine route.   Yes [provider]  SUBOXONE 8-2 MG FILM DISSOLVE 1 FILM UNT D X 7 DAYS 12/13/17  Yes [provider]     ALLERGIES:  Allergies  Allergen Reactions  . Penicillins Swelling     SOCIAL HISTORY:  Social History   Socioeconomic History  . Marital status: Single    Spouse name: Not on file  . Number of children: Not on file  . Years of education: Not on file  . Highest education level: Not on file  Occupational History  . Not on file  Social Needs  . Financial resource strain: Not on file  . Food insecurity:    Worry: Not on file    Inability: Not on file  . Transportation needs:    Medical: Not on file    Non-medical: Not on file  Tobacco Use  . Smoking status: Current Every Day Smoker    Packs/day: 1.00    Years: 12.00    Pack years: 12.00    Types: Cigarettes  . Smokeless tobacco: Never Used  Substance and Sexual Activity  . Alcohol use: No  . Drug use: Yes    Types: Marijuana  . Sexual activity: Yes  Birth control/protection: Implant  Lifestyle  . Physical activity:    Days per week: Not on file    Minutes per session: Not on file  . Stress: Not on file  Relationships  . Social connections:    Talks on phone: Not on file    Gets together: Not on file    Attends religious service: Not on file    Active member of club or organization: Not on file    Attends meetings of clubs or organizations: Not on file    Relationship status: Not on file  . Intimate partner violence:    Fear of current or ex partner: Not on file    Emotionally  abused: Not on file    Physically abused: Not on file    Forced sexual activity: Not on file  Other Topics Concern  . Not on file  Social History Narrative  . Not on file    The patient currently resides (home / rehab facility / nursing home): Home The patient normally is (ambulatory / bedbound): Ambulatory  FAMILY HISTORY:  Family History  Problem Relation Age of Onset  . Hypertension Mother   . Diabetes Father   . Cancer Maternal Grandmother        Liver  . Hypertension Maternal Grandfather   . Heart disease Paternal Grandfather     Otherwise negative/non-contributory.  REVIEW OF SYSTEMS:  Constitutional: denies any other weight loss, fever, chills, or sweats  Eyes: denies any other vision changes, history of eye injury  ENT: denies sore throat, hearing problems  Respiratory: denies shortness of breath, wheezing  Cardiovascular: denies chest pain, palpitations  Gastrointestinal: abdominal pain, N/V, and bowel function as per HPI Anorectal: peri-anal pain and blood per rectum as per HPI Musculoskeletal: denies any other joint pains or cramps  Skin: Denies any other rashes or skin discolorations Neurological: denies any other headache, dizziness, weakness  Psychiatric: Denies any other depression, anxiety   All other review of systems were otherwise negative   VITAL SIGNS:  BP 134/89   Pulse 71   Temp 97.8 F (36.6 C) (Skin)   Ht 5\' 1"  (1.549 m)   Wt 222 lb (100.7 kg)   SpO2 95% Comment: room air  BMI 41.95 kg/m   PHYSICAL EXAM:  Constitutional:  -- Obese body habitus  -- Awake, alert, and oriented x3  Eyes:  -- Pupils equally round and reactive to light  -- No scleral icterus  Ear, nose, throat:  -- No jugular venous distension -- No nasal drainage, bleeding Pulmonary:  -- No crackles  -- Equal breath sounds bilaterally -- Breathing non-labored at rest Cardiovascular:  -- S1, S2 present  -- No pericardial rubs  Gastrointestinal:  -- Abdomen soft and  non-distended with mild epigastric tenderness to deep palpation, no guarding/rebound  -- No abdominal masses appreciated, pulsatile or otherwise  Anorectal: -- No prolapsed hemorrhoids, anal fissures, or mass appreciated -- Anal sphincter tone WNL without appreciation of any gross blood Musculoskeletal and Integumentary:  -- Wounds or skin discoloration: None appreciated -- Extremities: B/L UE and LE FROM, hands and feet warm, no edema  Neurologic:  -- Motor function: Intact and symmetric -- Sensation: Intact and symmetric  Labs:  CBC Latest Ref Rng & Units 06/07/2016 01/19/2016 01/06/2016  WBC 3.6 - 11.0 K/uL 7.3 6.7 8.5  Hemoglobin 12.0 - 16.0 g/dL 12.7 11.8 13.5  Hematocrit 35.0 - 47.0 % 35.7 38.3 41.3  Platelets 150 - 440 K/uL 266 263 273   CMP  Latest Ref Rng & Units 06/07/2016 01/06/2016 10/15/2014  Glucose 65 - 99 mg/dL 96 106(H) -  BUN 6 - 20 mg/dL 9 13 8   Creatinine 0.44 - 1.00 mg/dL 0.53 0.56(L) 0.7  Sodium 135 - 145 mmol/L 138 140 138  Potassium 3.5 - 5.1 mmol/L 4.0 4.1 4.2  Chloride 101 - 111 mmol/L 102 101 -  CO2 22 - 32 mmol/L 28 21 -  Calcium 8.9 - 10.3 mg/dL 8.8(L) 9.3 -  Total Protein 6.5 - 8.1 g/dL 7.1 7.0 -  Total Bilirubin 0.3 - 1.2 mg/dL 0.4 0.2 -  Alkaline Phos 38 - 126 U/L 74 71 70  AST 15 - 41 U/L 24 16 22   ALT 14 - 54 U/L 21 13 17     Imaging studies: No new pertinent imaging studies available for review   Assessment/Plan:  32 y.o. female with chronic uncontrolled GERD and recent development of loose BM's (chronic constipation) and small amounts of blood per rectum, complicated by co-morbidities including morbid obesity (BMI 42), history of hepatitis C, history of polysubstance abuse, generalized anxiety disorder, and major depression disorder.   - start omeprazole once daily, not prn  - if omeprazole (Prilosec OTC) not well-tolerated, can try alternate PPI  - minimize alcohol, minimize NSAIDS such as ibuprofen, naproxen, etc, prefer Tylenol for now (do not  take with alcohol)  - increase fiber in diet, metamucil supplement if unable/unwilling to eat higher fiber foods, though high-fiber foods preferred  - in addition to increased dietary fiber, apply ice and 5% topical lidocaine cream prn for peri-anal pain  - outpatient GI referral to Dr. Allen Norris for both GERD and blood per rectum  - return to clinic in next few weeks, following GI evaluation  - instructed to call if any questions or concerns  All of the above recommendations were discussed with the patient, and all of patient's questions were answered to her expressed satisfaction.  Thank you for the opportunity to participate in this patient's care.  -- Marilynne Drivers Rosana Hoes, MD, [place]: [facility] - Partnering for exceptional care. Office: [phone]

## 2017-12-25 ENCOUNTER — Encounter: Payer: Self-pay | Admitting: Surgery

## 2017-12-25 DIAGNOSIS — K219 Gastro-esophageal reflux disease without esophagitis: Secondary | ICD-10-CM | POA: Insufficient documentation

## 2018-01-14 ENCOUNTER — Encounter: Payer: Self-pay | Admitting: Gastroenterology

## 2018-01-14 ENCOUNTER — Other Ambulatory Visit: Payer: Self-pay

## 2018-01-14 ENCOUNTER — Ambulatory Visit: Payer: Medicaid Other | Admitting: Gastroenterology

## 2018-01-14 VITALS — BP 127/69 | HR 55 | Ht 63.0 in | Wt 224.0 lb

## 2018-01-14 DIAGNOSIS — R1013 Epigastric pain: Secondary | ICD-10-CM

## 2018-01-14 DIAGNOSIS — K921 Melena: Secondary | ICD-10-CM | POA: Diagnosis not present

## 2018-01-14 DIAGNOSIS — G8929 Other chronic pain: Secondary | ICD-10-CM | POA: Diagnosis not present

## 2018-01-14 NOTE — Progress Notes (Signed)
Gastroenterology Consultation  Referring Provider:     Vickie Epley, MD Primary Care Physician:  Valerie Roys, DO Primary Gastroenterologist:  Dr. Allen Norris     Reason for Consultation:    Epigastric pain        HPI:   Katie Stanley is a 32 y.o. y/o female referred for consultation & management of hematochezia by Dr. Wynetta Emery, Megan P, DO.  This patient comes in today after seeing me in the past for hepatitis C.  At that time the patient was found to have been devoid of viral load and considered to have spontaneously resolved.  The patient was seen by Dr. Rosana Hoes and surgery for rectal bleeding and a history of heartburn.  The patient was started on omeprazole and suggested to come see me today.  The patient reported the abdominal pain to be epigastric pain and would be helped by omeprazole but she stopped taking it because she reported she did not like the way it made her feel.  The patient also takes NSAIDs for back pain. The patient reports that she had been chronically constipated since her teenage years with a change recently with more diarrhea and urgency and after she moves her bowels she states that there is some bright red blood on the toilet paper when she wipes. She reports that her change in bowel habits has improved and so has her rectal bleeding but she is now concerned about her epigastric pain.  Past Medical History:  Diagnosis Date  . Anxiety   . Biliary calculi 12/07/2009  . Cholelithiasis   . Depression   . Hepatitis C   . Substance abuse [facility])     Past Surgical History:  Procedure Laterality Date  . CHOLECYSTECTOMY      Prior to Admission medications   Medication Sig Start Date End Date Taking? Authorizing Provider  citalopram (CELEXA) 20 MG tablet Take 1.5 tablets (30 mg total) by mouth daily. 11/20/17   Volney American, PA-C  ibuprofen (ADVIL,MOTRIN) 800 MG tablet TAKE 1 TABLET(800 MG) BY MOUTH EVERY 8 HOURS AS NEEDED 11/20/17   Volney American, PA-C  Lidocaine 5 % CREA Apply 1 Tube topically 2 (two) times daily. 12/20/17   Vickie Epley, MD  omeprazole (PRILOSEC) 40 MG capsule Take 1 capsule (40 mg total) by mouth daily. 12/20/17   Vickie Epley, MD  PARAGARD INTRAUTERINE COPPER IU by Intrauterine route.    [provider]  SUBOXONE 8-2 MG FILM DISSOLVE 1 FILM UNT D X 7 DAYS 12/13/17   [provider]    Family History  Problem Relation Age of Onset  . Hypertension Mother   . Diabetes Father   . Cancer Maternal Grandmother        Liver  . Hypertension Maternal Grandfather   . Heart disease Paternal Grandfather      Social History   Tobacco Use  . Smoking status: Current Every Day Smoker    Packs/day: 1.00    Years: 12.00    Pack years: 12.00    Types: Cigarettes  . Smokeless tobacco: Never Used  Substance Use Topics  . Alcohol use: No  . Drug use: Yes    Types: Marijuana    Allergies as of 01/14/2018 - Review Complete 12/25/2017  Allergen Reaction Noted  . Penicillins Swelling 12/19/2014    Review of Systems:    All systems reviewed and negative except where noted in HPI.   Physical Exam:  There were  no vitals taken for this visit. No LMP recorded. General:   Alert,  Well-developed, well-nourished, pleasant and cooperative in NAD Head:  Normocephalic and atraumatic. Eyes:  Sclera clear, no icterus.   Conjunctiva pink. Ears:  Normal auditory acuity. Nose:  No deformity, discharge, or lesions. Mouth:  No deformity or lesions,oropharynx pink & moist. Neck:  Supple; no masses or thyromegaly. Lungs:  Respirations even and unlabored.  Clear throughout to auscultation.   No wheezes, crackles, or rhonchi. No acute distress. Heart:  Regular rate and rhythm; no murmurs, clicks, rubs, or gallops. Abdomen:  Normal bowel sounds.  No bruits.  Soft, positive tenderness over her scar in the mid epigastric area  and non-distended without masses, hepatosplenomegaly or hernias noted.  No  guarding or rebound tenderness.  Negative Carnett sign.   Rectal:  Deferred.  Msk:  Symmetrical without gross deformities.  Good, equal movement & strength bilaterally. Pulses:  Normal pulses noted. Extremities:  No clubbing or edema.  No cyanosis. Neurologic:  Alert and oriented x3;  grossly normal neurologically. Skin:  Intact without significant lesions or rashes.  No jaundice. Lymph Nodes:  No significant cervical adenopathy. Psych:  Alert and cooperative. Normal mood and affect.  Imaging Studies: No results found.  Assessment and Plan:   Katie Stanley is a 32 y.o. y/o female who has a history of rectal bleeding that she states has resolved.  The patient also has had epigastric pain which is her main concern right now.  The patient's rectal bleeding is likely hemorrhoidal.  The patient will be set up for an EGD due to her epigastric pain and history of heartburn.  The patient has not had any weight loss or dysphasia. I have discussed risks & benefits which include, but are not limited to, bleeding, infection, perforation & drug reaction.  The patient agrees with this plan & written consent will be obtained.     Lucilla Lame, MD. [name]    Note: This dictation was prepared with Dragon dictation along with smaller phrase technology. Any transcriptional errors that result from this process are unintentional.

## 2018-01-14 NOTE — H&P (View-Only) (Signed)
Gastroenterology Consultation  Referring Provider:     Vickie Epley, MD Primary Care Physician:  Valerie Roys, DO Primary Gastroenterologist:  Dr. [facility] Norris     Reason for Consultation:    Epigastric pain        HPI:   Katie Stanley is a 32 y.o. y/o female referred for consultation & management of hematochezia by Dr. Wynetta Emery, Megan P, DO.  This patient comes in today after seeing me in the past for hepatitis C.  At that time the patient was found to have been devoid of viral load and considered to have spontaneously resolved.  The patient was seen by Dr. Rosana Hoes and surgery for rectal bleeding and a history of heartburn.  The patient was started on omeprazole and suggested to come see me today.  The patient reported the abdominal pain to be epigastric pain and would be helped by omeprazole but she stopped taking it because she reported she did not like the way it made her feel.  The patient also takes NSAIDs for back pain. The patient reports that she had been chronically constipated since her teenage years with a change recently with more diarrhea and urgency and after she moves her bowels she states that there is some bright red blood on the toilet paper when she wipes. She reports that her change in bowel habits has improved and so has her rectal bleeding but she is now concerned about her epigastric pain.  Past Medical History:  Diagnosis Date  . Anxiety   . Biliary calculi 12/07/2009  . Cholelithiasis   . Depression   . Hepatitis C   . Substance abuse [facility])     Past Surgical History:  Procedure Laterality Date  . CHOLECYSTECTOMY      Prior to Admission medications   Medication Sig Start Date End Date Taking? Authorizing Provider  citalopram (CELEXA) 20 MG tablet Take 1.5 tablets (30 mg total) by mouth daily. 11/20/17   Volney American, PA-C  ibuprofen (ADVIL,MOTRIN) 800 MG tablet TAKE 1 TABLET(800 MG) BY MOUTH EVERY 8 HOURS AS NEEDED 11/20/17   Volney American, PA-C  Lidocaine 5 % CREA Apply 1 Tube topically 2 (two) times daily. 12/20/17   Vickie Epley, MD  omeprazole (PRILOSEC) 40 MG capsule Take 1 capsule (40 mg total) by mouth daily. 12/20/17   Vickie Epley, MD  PARAGARD INTRAUTERINE COPPER IU by Intrauterine route.    [provider]  SUBOXONE 8-2 MG FILM DISSOLVE 1 FILM UNT D X 7 DAYS 12/13/17   [provider]    Family History  Problem Relation Age of Onset  . Hypertension Mother   . Diabetes Father   . Cancer Maternal Grandmother        Liver  . Hypertension Maternal Grandfather   . Heart disease Paternal Grandfather      Social History   Tobacco Use  . Smoking status: Current Every Day Smoker    Packs/day: 1.00    Years: 12.00    Pack years: 12.00    Types: Cigarettes  . Smokeless tobacco: Never Used  Substance Use Topics  . Alcohol use: No  . Drug use: Yes    Types: Marijuana    Allergies as of 01/14/2018 - Review Complete 12/25/2017  Allergen Reaction Noted  . Penicillins Swelling 12/19/2014    Review of Systems:    All systems reviewed and negative except where noted in HPI.   Physical Exam:  There were  no vitals taken for this visit. No LMP recorded. General:   Alert,  Well-developed, well-nourished, pleasant and cooperative in NAD Head:  Normocephalic and atraumatic. Eyes:  Sclera clear, no icterus.   Conjunctiva pink. Ears:  Normal auditory acuity. Nose:  No deformity, discharge, or lesions. Mouth:  No deformity or lesions,oropharynx pink & moist. Neck:  Supple; no masses or thyromegaly. Lungs:  Respirations even and unlabored.  Clear throughout to auscultation.   No wheezes, crackles, or rhonchi. No acute distress. Heart:  Regular rate and rhythm; no murmurs, clicks, rubs, or gallops. Abdomen:  Normal bowel sounds.  No bruits.  Soft, positive tenderness over her scar in the mid epigastric area  and non-distended without masses, hepatosplenomegaly or hernias noted.  No  guarding or rebound tenderness.  Negative Carnett sign.   Rectal:  Deferred.  Msk:  Symmetrical without gross deformities.  Good, equal movement & strength bilaterally. Pulses:  Normal pulses noted. Extremities:  No clubbing or edema.  No cyanosis. Neurologic:  Alert and oriented x3;  grossly normal neurologically. Skin:  Intact without significant lesions or rashes.  No jaundice. Lymph Nodes:  No significant cervical adenopathy. Psych:  Alert and cooperative. Normal mood and affect.  Imaging Studies: No results found.  Assessment and Plan:   Katie Stanley is a 32 y.o. y/o female who has a history of rectal bleeding that she states has resolved.  The patient also has had epigastric pain which is her main concern right now.  The patient's rectal bleeding is likely hemorrhoidal.  The patient will be set up for an EGD due to her epigastric pain and history of heartburn.  The patient has not had any weight loss or dysphasia. I have discussed risks & benefits which include, but are not limited to, bleeding, infection, perforation & drug reaction.  The patient agrees with this plan & written consent will be obtained.     Lucilla Lame, MD. [name]    Note: This dictation was prepared with Dragon dictation along with smaller phrase technology. Any transcriptional errors that result from this process are unintentional.

## 2018-01-15 ENCOUNTER — Encounter: Payer: Self-pay | Admitting: *Deleted

## 2018-01-15 ENCOUNTER — Other Ambulatory Visit: Payer: Self-pay

## 2018-01-15 ENCOUNTER — Ambulatory Visit: Payer: Medicaid Other | Admitting: Anesthesiology

## 2018-01-18 NOTE — Discharge Instructions (Signed)
General Anesthesia, Adult, Care After °These instructions provide you with information about caring for yourself after your procedure. Your health care provider may also give you more specific instructions. Your treatment has been planned according to current medical practices, but problems sometimes occur. Call your health care provider if you have any problems or questions after your procedure. °What can I expect after the procedure? °After the procedure, it is common to have: °· Vomiting. °· A sore throat. °· Mental slowness. ° °It is common to feel: °· Nauseous. °· Cold or shivery. °· Sleepy. °· Tired. °· Sore or achy, even in parts of your body where you did not have surgery. ° °Follow these instructions at home: °For at least 24 hours after the procedure: °· Do not: °? Participate in activities where you could fall or become injured. °? Drive. °? Use heavy machinery. °? Drink alcohol. °? Take sleeping pills or medicines that cause drowsiness. °? Make important decisions or sign legal documents. °? Take care of children on your own. °· Rest. °Eating and drinking °· If you vomit, drink water, juice, or soup when you can drink without vomiting. °· Drink enough fluid to keep your urine clear or pale yellow. °· Make sure you have little or no nausea before eating solid foods. °· Follow the diet recommended by your health care provider. °General instructions °· Have a responsible adult stay with you until you are awake and alert. °· Return to your normal activities as told by your health care provider. Ask your health care provider what activities are safe for you. °· Take over-the-counter and prescription medicines only as told by your health care provider. °· If you smoke, do not smoke without supervision. °· Keep all follow-up visits as told by your health care provider. This is important. °Contact a health care provider if: °· You continue to have nausea or vomiting at home, and medicines are not helpful. °· You  cannot drink fluids or start eating again. °· You cannot urinate after 8-12 hours. °· You develop a skin rash. °· You have fever. °· You have increasing redness at the site of your procedure. °Get help right away if: °· You have difficulty breathing. °· You have chest pain. °· You have unexpected bleeding. °· You feel that you are having a life-threatening or urgent problem. °This information is not intended to replace advice given to you by your health care provider. Make sure you discuss any questions you have with your health care provider. °Document Released: 07/03/2000 Document Revised: 08/30/2015 Document Reviewed: 03/11/2015 °Elsevier Interactive Patient Education © 2018 [employer] ° °

## 2018-01-21 ENCOUNTER — Ambulatory Visit
Admission: RE | Admit: 2018-01-21 | Discharge: 2018-01-21 | Disposition: A | Discharge status: Home / Self Care | Payer: Medicaid Other | Source: Ambulatory Visit | Attending: Gastroenterology | Admitting: Gastroenterology

## 2018-01-21 ENCOUNTER — Encounter
Admission: RE | Disposition: A | Discharge status: Home / Self Care | Payer: Self-pay | Source: Ambulatory Visit | Attending: Gastroenterology

## 2018-01-21 DIAGNOSIS — F419 Anxiety disorder, unspecified: Secondary | ICD-10-CM | POA: Diagnosis not present

## 2018-01-21 DIAGNOSIS — M549 Dorsalgia, unspecified: Secondary | ICD-10-CM | POA: Insufficient documentation

## 2018-01-21 DIAGNOSIS — K2950 Unspecified chronic gastritis without bleeding: Secondary | ICD-10-CM | POA: Insufficient documentation

## 2018-01-21 DIAGNOSIS — Z79899 Other long term (current) drug therapy: Secondary | ICD-10-CM | POA: Insufficient documentation

## 2018-01-21 DIAGNOSIS — K2100 Gastro-esophageal reflux disease with esophagitis, without bleeding: Secondary | ICD-10-CM

## 2018-01-21 DIAGNOSIS — Z791 Long term (current) use of non-steroidal anti-inflammatories (NSAID): Secondary | ICD-10-CM | POA: Diagnosis not present

## 2018-01-21 DIAGNOSIS — Z9049 Acquired absence of other specified parts of digestive tract: Secondary | ICD-10-CM | POA: Insufficient documentation

## 2018-01-21 DIAGNOSIS — R1013 Epigastric pain: Secondary | ICD-10-CM

## 2018-01-21 DIAGNOSIS — Z8619 Personal history of other infectious and parasitic diseases: Secondary | ICD-10-CM | POA: Insufficient documentation

## 2018-01-21 DIAGNOSIS — K2900 Acute gastritis without bleeding: Secondary | ICD-10-CM | POA: Diagnosis not present

## 2018-01-21 DIAGNOSIS — F329 Major depressive disorder, single episode, unspecified: Secondary | ICD-10-CM | POA: Insufficient documentation

## 2018-01-21 DIAGNOSIS — K449 Diaphragmatic hernia without obstruction or gangrene: Secondary | ICD-10-CM | POA: Diagnosis not present

## 2018-01-21 DIAGNOSIS — F17210 Nicotine dependence, cigarettes, uncomplicated: Secondary | ICD-10-CM | POA: Insufficient documentation

## 2018-01-21 HISTORY — PX: ESOPHAGOGASTRODUODENOSCOPY (EGD) WITH PROPOFOL: SHX5813

## 2018-01-21 HISTORY — DX: Gastro-esophageal reflux disease without esophagitis: K21.9

## 2018-01-21 SURGERY — ESOPHAGOGASTRODUODENOSCOPY (EGD) WITH PROPOFOL
Anesthesia: General | Site: Throat

## 2018-01-21 MED ORDER — LACTATED RINGERS IV SOLN: 1000.0000 mL | INTRAVENOUS | Status: DC

## 2018-01-21 MED ORDER — SODIUM CHLORIDE 4 MEQ/ML IV SOLN
Freq: Once | ORAL | Status: AC
  Administered 2018-01-21: 13:00:00 via TOPICAL

## 2018-01-21 MED ORDER — ACETAMINOPHEN 160 MG/5ML PO SOLN: 325.0000 mg | ORAL | Status: DC | PRN

## 2018-01-21 MED ORDER — ACETAMINOPHEN 325 MG PO TABS: 325.0000 mg | ORAL_TABLET | ORAL | Status: DC | PRN

## 2018-01-21 MED ORDER — SODIUM CHLORIDE 0.9 % IV SOLN: INTRAVENOUS | Status: DC

## 2018-01-21 SURGICAL SUPPLY — 7 items
BLOCK BITE 60FR ADLT L/F GRN (MISCELLANEOUS) ×3 IMPLANT
CANISTER SUCT 1200ML W/VALVE (MISCELLANEOUS) ×3 IMPLANT
FORCEPS BIOP RAD 4 LRG CAP 4 (CUTTING FORCEPS) ×3 IMPLANT
GOWN CVR UNV OPN BCK APRN NK (MISCELLANEOUS) ×2 IMPLANT
GOWN ISOL THUMB LOOP REG UNIV (MISCELLANEOUS) ×4
KIT ENDO PROCEDURE OLY (KITS) ×3 IMPLANT
WATER STERILE IRR 250ML POUR (IV SOLUTION) ×3 IMPLANT

## 2018-01-21 NOTE — Anesthesia Preprocedure Evaluation (Deleted)
Anesthesia Evaluation  Patient identified by MRN, date of birth, ID band Patient awake    Reviewed: Allergy & Precautions, H&P , NPO status , Patient's Chart, lab work & pertinent test results, reviewed documented beta blocker date and time   Airway Mallampati: I  TM Distance: >3 FB Neck ROM: full    Dental no notable dental hx.    Pulmonary Current Smoker,    Pulmonary exam normal breath sounds clear to auscultation       Cardiovascular Exercise Tolerance: Good negative cardio ROS Normal cardiovascular exam Rhythm:regular Rate:Normal     Neuro/Psych negative neurological ROS  negative psych ROS   GI/Hepatic GERD  ,(+) Hepatitis -, C  Endo/Other  negative endocrine ROS  Renal/GU negative Renal ROS  negative genitourinary   Musculoskeletal   Abdominal   Peds  Hematology negative hematology ROS (+)   Anesthesia Other Findings   Reproductive/Obstetrics negative OB ROS                            Anesthesia Physical Anesthesia Plan  ASA: II  Anesthesia Plan: General   Post-op Pain Management:    Induction:   PONV Risk Score and Plan:   Airway Management Planned:   Additional Equipment:   Intra-op Plan:   Post-operative Plan:   Informed Consent: I have reviewed the patients History and Physical, chart, labs and discussed the procedure including the risks, benefits and alternatives for the proposed anesthesia with the patient or authorized representative who has indicated his/her understanding and acceptance.   Dental Advisory Given  Plan Discussed with: CRNA  Anesthesia Plan Comments:         Anesthesia Quick Evaluation

## 2018-01-21 NOTE — Op Note (Signed)
[facility] Patient Name: Katie Stanley Procedure Date: 01/21/2018 12:33 PM MRN: [id number] Account #: [id number] Date of Birth: 06/15/1985 Admit Type: Outpatient Age: 32 Room: [facility] OR ROOM 01 Gender: Female Note Status: Finalized Procedure:            Upper GI endoscopy Indications:          Epigastric abdominal pain Providers:            Lucilla Lame MD, MD Referring MD:         Valerie Roys (Referring MD) Medicines:            None Complications:        No immediate complications. Procedure:            Pre-Anesthesia Assessment:                       - Prior to the procedure, a History and Physical was                        performed, and patient medications and allergies were                        reviewed. The patient's tolerance of previous                        anesthesia was also reviewed. The risks and benefits of                        the procedure and the sedation options and risks were                        discussed with the patient. All questions were                        answered, and informed consent was obtained. Prior                        Anticoagulants: The patient has taken no previous                        anticoagulant or antiplatelet agents. ASA Grade                        Assessment: II - A patient with mild systemic disease.                        After reviewing the risks and benefits, the patient was                        deemed in satisfactory condition to undergo the                        procedure.                       After obtaining informed consent, the endoscope was                        passed under direct vision. Throughout the procedure,  the patient's blood pressure, pulse, and oxygen                        saturations were monitored continuously. The was                        introduced through the mouth, and advanced to the                        second part of  duodenum. The upper GI endoscopy was                        accomplished without difficulty. The patient tolerated                        the procedure well. Findings:      A small hiatal hernia was present.      LA Grade A (one or more mucosal breaks less than 5 mm, not extending       between tops of 2 mucosal folds) esophagitis with no bleeding was found       at the gastroesophageal junction.      Localized mild inflammation characterized by erythema was found in the       gastric antrum. Biopsies were taken with a cold forceps for histology.      The examined duodenum was normal. Impression:           - Small hiatal hernia.                       - LA Grade A reflux esophagitis.                       - Gastritis. Biopsied.                       - Normal examined duodenum. Recommendation:       - Discharge patient to home.                       - Resume previous diet.                       - Continue present medications.                       - Await pathology results. Procedure Code(s):    --- Professional ---                       [phone], Esophagogastroduodenoscopy, flexible, transoral;                        with biopsy, single or multiple Diagnosis Code(s):    --- Professional ---                       K29.70, Gastritis, unspecified, without bleeding                       R10.13, Epigastric pain                       K21.0, Gastro-esophageal reflux disease with esophagitis CPT copyright 2018 American Medical Association. All rights reserved. The  codes documented in this report are preliminary and upon coder review may  be revised to meet current compliance requirements. Lucilla Lame MD, MD 01/21/2018 12:50:49 PM This report has been signed electronically. Number of Addenda: 0 Note Initiated On: 01/21/2018 12:33 PM      [facility]

## 2018-01-21 NOTE — Interval H&P Note (Signed)
History and Physical Interval Note:  01/21/2018 11:30 AM  Katie Stanley  has presented today for surgery, with the diagnosis of Epigastric pain R10.13  The various methods of treatment have been discussed with the patient and family. After consideration of risks, benefits and other options for treatment, the patient has consented to  Procedure(s): ESOPHAGOGASTRODUODENOSCOPY (EGD) WITH PROPOFOL (N/A) as a surgical intervention .  The patient's history has been reviewed, patient examined, no change in status, stable for surgery.  I have reviewed the patient's chart and labs.  Questions were answered to the patient's satisfaction.     Deonne Rooks [employer]

## 2018-01-21 NOTE — OR Nursing (Signed)
1242: BP-112/73 RR-18 SPO2- 100% HR-87  1247: BP-106/74 RR-18 SPO2-100% HR-68  1251: BP-130/74 RR-18 SPO2-100% HR-68

## 2018-01-22 ENCOUNTER — Encounter: Payer: Self-pay | Admitting: Gastroenterology

## 2018-01-23 ENCOUNTER — Encounter: Payer: Self-pay | Admitting: Gastroenterology

## 2018-01-30 ENCOUNTER — Encounter: Payer: Self-pay | Admitting: Gastroenterology

## 2018-02-22 ENCOUNTER — Other Ambulatory Visit: Payer: Self-pay | Admitting: Surgery

## 2018-02-28 ENCOUNTER — Other Ambulatory Visit: Payer: Self-pay | Admitting: Surgery

## 2018-02-28 NOTE — Telephone Encounter (Signed)
Call in one refill for omeprazole 40 mg. No More Refill has to see her PCP.

## 2018-04-25 ENCOUNTER — Encounter: Payer: Self-pay | Admitting: Family Medicine

## 2018-04-25 ENCOUNTER — Ambulatory Visit: Payer: Medicaid Other | Admitting: Family Medicine

## 2018-04-25 VITALS — BP 103/71 | HR 68 | Temp 97.9°F | Ht 63.0 in | Wt 229.9 lb

## 2018-04-25 DIAGNOSIS — L03116 Cellulitis of left lower limb: Secondary | ICD-10-CM | POA: Diagnosis not present

## 2018-04-25 MED ORDER — SULFAMETHOXAZOLE-TRIMETHOPRIM 800-160 MG PO TABS: 1.0000 | ORAL_TABLET | Freq: Two times a day (BID) | ORAL | 0 refills | Status: DC

## 2018-04-25 NOTE — Patient Instructions (Signed)
You have an appointment scheduled with:  Dr. Allen Norris at [facility] in [facility] February 19th, 2020 at 9:15AM

## 2018-04-25 NOTE — Progress Notes (Signed)
BP 103/71 (BP Location: Left Arm, Patient Position: Sitting, Cuff Size: Normal)   Pulse 68   Temp 97.9 F (36.6 C) (Oral)   Ht 5\' 3"  (1.6 m)   Wt 229 lb 14.4 oz (104.3 kg)   SpO2 97%   BMI 40.72 kg/m    Subjective:    Patient ID: Katie Stanley, female    DOB: 06-Jun-1985, 33 y.o.   MRN: [id number]  HPI: Katie Stanley is a 33 y.o. female  Chief Complaint  Patient presents with  . Edema    Bilateral, legs and feet. Ongoing 3 weeks. Patient states left leg is painful and discolored.    Has been having swelling and redness in her L leg for about 3 weeks. Notes that her R leg has also had some swelling, but is not painful or red. She notes that she bumps it on her bed post a lot. She denies any fevers, chills, history of blood clots. She is otherwise feeling well with no other concerns or complaints at this time.   Relevant past medical, surgical, family and social history reviewed and updated as indicated. Interim medical history since our last visit reviewed. Allergies and medications reviewed and updated.  Review of Systems  Constitutional: Negative.   Respiratory: Negative.   Cardiovascular: Negative.   Musculoskeletal: Positive for myalgias. Negative for arthralgias, back pain, gait problem, joint swelling, neck pain and neck stiffness.  Skin: Positive for color change. Negative for pallor, rash and wound.  Psychiatric/Behavioral: Negative.     Per HPI unless specifically indicated above     Objective:    BP 103/71 (BP Location: Left Arm, Patient Position: Sitting, Cuff Size: Normal)   Pulse 68   Temp 97.9 F (36.6 C) (Oral)   Ht 5\' 3"  (1.6 m)   Wt 229 lb 14.4 oz (104.3 kg)   SpO2 97%   BMI 40.72 kg/m   Wt Readings from Last 3 Encounters:  04/25/18 229 lb 14.4 oz (104.3 kg)  01/21/18 222 lb (100.7 kg)  01/14/18 224 lb (101.6 kg)    Physical Exam Vitals signs and nursing note reviewed.  Constitutional:      General: She is not in acute distress.  Appearance: Normal appearance. She is not ill-appearing, toxic-appearing or diaphoretic.  HENT:     Head: Normocephalic and atraumatic.     Right Ear: External ear normal.     Left Ear: External ear normal.     Nose: Nose normal.     Mouth/Throat:     Mouth: Mucous membranes are moist.     Pharynx: Oropharynx is clear.  Eyes:     General: No scleral icterus.       Right eye: No discharge.        Left eye: No discharge.     Extraocular Movements: Extraocular movements intact.     Conjunctiva/sclera: Conjunctivae normal.     Pupils: Pupils are equal, round, and reactive to light.  Neck:     Musculoskeletal: Normal range of motion and neck supple.  Cardiovascular:     Rate and Rhythm: Normal rate and regular rhythm.     Pulses: Normal pulses.     Heart sounds: Normal heart sounds. No murmur. No friction rub. No gallop.   Pulmonary:     Effort: Pulmonary effort is normal. No respiratory distress.     Breath sounds: Normal breath sounds. No stridor. No wheezing, rhonchi or rales.  Chest:     Chest wall: No tenderness.  Musculoskeletal: Normal range of motion.        General: Swelling (redness and swelling to midcalf of L leg, negative Homan's negative squeeze) and tenderness present.  Skin:    General: Skin is warm and dry.     Capillary Refill: Capillary refill takes less than 2 seconds.     Coloration: Skin is not jaundiced or pale.     Findings: No bruising, erythema, lesion or rash.  Neurological:     General: No focal deficit present.     Mental Status: She is alert and oriented to person, place, and time. Mental status is at baseline.  Psychiatric:        Mood and Affect: Mood normal.        Behavior: Behavior normal.        Thought Content: Thought content normal.        Judgment: Judgment normal.     Results for orders placed or performed during the hospital encounter of 06/25/17  Rapid strep screen  Result Value Ref Range   Streptococcus, Group A Screen (Direct)  POSITIVE (A) NEGATIVE      Assessment & Plan:   Problem List Items Addressed This Visit    None    Visit Diagnoses    Cellulitis of left leg    -  Primary   Will treat with bactrim and recheck 1 week. Call with any concerns.        Follow up plan: Return in about 1 week (around 05/02/2018) for physical.

## 2018-04-29 ENCOUNTER — Other Ambulatory Visit (HOSPITAL_COMMUNITY)
Admission: RE | Admit: 2018-04-29 | Discharge: 2018-04-29 | Disposition: A | Discharge status: Home / Self Care | Payer: Medicaid Other | Source: Ambulatory Visit | Attending: Family Medicine | Admitting: Family Medicine

## 2018-04-29 ENCOUNTER — Ambulatory Visit (INDEPENDENT_AMBULATORY_CARE_PROVIDER_SITE_OTHER): Payer: Medicaid Other | Admitting: Family Medicine

## 2018-04-29 ENCOUNTER — Encounter: Payer: Self-pay | Admitting: Family Medicine

## 2018-04-29 VITALS — BP 125/74 | HR 80 | Temp 97.9°F | Ht 63.5 in | Wt 234.1 lb

## 2018-04-29 DIAGNOSIS — B182 Chronic viral hepatitis C: Secondary | ICD-10-CM

## 2018-04-29 DIAGNOSIS — K2900 Acute gastritis without bleeding: Secondary | ICD-10-CM

## 2018-04-29 DIAGNOSIS — Z124 Encounter for screening for malignant neoplasm of cervix: Secondary | ICD-10-CM | POA: Insufficient documentation

## 2018-04-29 DIAGNOSIS — H538 Other visual disturbances: Secondary | ICD-10-CM

## 2018-04-29 DIAGNOSIS — Z0000 Encounter for general adult medical examination without abnormal findings: Secondary | ICD-10-CM

## 2018-04-29 DIAGNOSIS — L03116 Cellulitis of left lower limb: Secondary | ICD-10-CM

## 2018-04-29 LAB — UA/M W/RFLX CULTURE, ROUTINE
Bilirubin, UA: NEGATIVE
Glucose, UA: NEGATIVE
Ketones, UA: NEGATIVE
Leukocytes, UA: NEGATIVE
Nitrite, UA: NEGATIVE
Protein, UA: NEGATIVE
SPEC GRAV UA: 1.03 (ref 1.005–1.030)
Urobilinogen, Ur: 0.2 mg/dL (ref 0.2–1.0)
pH, UA: 5.5 (ref 5.0–7.5)

## 2018-04-29 LAB — MICROSCOPIC EXAMINATION

## 2018-04-29 LAB — BAYER DCA HB A1C WAIVED: HB A1C (BAYER DCA - WAIVED): 5.2 % (ref <7.0)

## 2018-04-29 NOTE — Assessment & Plan Note (Signed)
Checking labs today. Await results.  

## 2018-04-29 NOTE — Progress Notes (Addendum)
BP 125/74   Pulse 80   Temp 97.9 F (36.6 C) (Oral)   Ht 5' 3.5" (1.613 m)   Wt 234 lb 1.6 oz (106.2 kg)   LMP 04/28/2018 (Exact Date)   SpO2 96%   BMI 40.82 kg/m    Subjective:    Patient ID: Katie Stanley, female    DOB: [birth year], 33 y.o.   MRN: [id number]  HPI: Katie Stanley is a 33 y.o. female presenting on 04/29/2018 for comprehensive medical examination. Current medical complaints include:none  Menopausal Symptoms: no  Depression Screen done today and results listed below:  Depression screen [facility] 2/9 04/29/2018 11/20/2017 12/02/2015  Decreased Interest 0 1 0  Down, Depressed, Hopeless 0 0 0  PHQ - 2 Score 0 1 0  Altered sleeping 0 2 -  Tired, decreased energy 1 1 -  Change in appetite 1 1 -  Feeling bad or failure about yourself  0 1 -  Trouble concentrating 0 0 -  Moving slowly or fidgety/restless 0 0 -  Suicidal thoughts 0 0 -  PHQ-9 Score 2 6 -    Past Medical History:  Past Medical History:  Diagnosis Date  . Anxiety   . Biliary calculi 12/07/2009  . Cholelithiasis   . Depression   . GERD (gastroesophageal reflux disease)   . Hepatitis C    body "self healed" per patient  . Substance abuse [facility])     Surgical History:  Past Surgical History:  Procedure Laterality Date  . CHOLECYSTECTOMY    . ESOPHAGOGASTRODUODENOSCOPY (EGD) WITH PROPOFOL N/A 01/21/2018   Procedure: ESOPHAGOGASTRODUODENOSCOPY (EGD) WITH Biopsy;  Surgeon: Lucilla Lame, MD;  Location: [place];  Service: Endoscopy;  Laterality: N/A;    Medications:  Current Outpatient Medications on File Prior to Visit  Medication Sig  . Biotin w/ Vitamins C & E (HAIR/SKIN/NAILS PO) Take by mouth.  . citalopram (CELEXA) 20 MG tablet Take 1.5 tablets (30 mg total) by mouth daily.  Marland Kitchen ibuprofen (ADVIL,MOTRIN) 800 MG tablet TAKE 1 TABLET(800 MG) BY MOUTH EVERY 8 HOURS AS NEEDED  . Lidocaine 5 % CREA Apply 1 Tube topically 2 (two) times daily.  Marland Kitchen omeprazole (PRILOSEC) 40 MG capsule Take 1  capsule (40 mg total) by mouth daily.  Marland Kitchen PARAGARD INTRAUTERINE COPPER IU by Intrauterine route.  . SUBOXONE 8-2 MG FILM DISSOLVE 1 FILM UNT D X 7 DAYS  . sulfamethoxazole-trimethoprim (BACTRIM DS,SEPTRA DS) 800-160 MG tablet Take 1 tablet by mouth 2 (two) times daily.   No current facility-administered medications on file prior to visit.     Allergies:  Allergies  Allergen Reactions  . Penicillins Swelling    Social History:  Social History   Socioeconomic History  . Marital status: Single    Spouse name: Not on file  . Number of children: Not on file  . Years of education: Not on file  . Highest education level: Not on file  Occupational History  . Not on file  Social Needs  . Financial resource strain: Not on file  . Food insecurity:    Worry: Not on file    Inability: Not on file  . Transportation needs:    Medical: Not on file    Non-medical: Not on file  Tobacco Use  . Smoking status: Current Every Day Smoker    Packs/day: 1.00    Years: 12.00    Pack years: 12.00    Types: Cigarettes  . Smokeless tobacco: Never Used  Substance and Sexual  Activity  . Alcohol use: Yes    Alcohol/week: 20.0 standard drinks    Types: 20 Shots of liquor per week  . Drug use: Yes    Types: Marijuana  . Sexual activity: Yes    Birth control/protection: Implant  Lifestyle  . Physical activity:    Days per week: Not on file    Minutes per session: Not on file  . Stress: Not on file  Relationships  . Social connections:    Talks on phone: Not on file    Gets together: Not on file    Attends religious service: Not on file    Active member of club or organization: Not on file    Attends meetings of clubs or organizations: Not on file    Relationship status: Not on file  . Intimate partner violence:    Fear of current or ex partner: Not on file    Emotionally abused: Not on file    Physically abused: Not on file    Forced sexual activity: Not on file  Other Topics Concern    . Not on file  Social History Narrative  . Not on file   Social History   Tobacco Use  Smoking Status Current Every Day Smoker  . Packs/day: 1.00  . Years: 12.00  . Pack years: 12.00  . Types: Cigarettes  Smokeless Tobacco Never Used   Social History   Substance and Sexual Activity  Alcohol Use Yes  . Alcohol/week: 20.0 standard drinks  . Types: 20 Shots of liquor per week    Family History:  Family History  Problem Relation Age of Onset  . Hypertension Mother   . Diabetes Father   . Cancer Maternal Grandmother        Liver  . Hypertension Maternal Grandfather   . Heart disease Paternal Grandfather     Past medical history, surgical history, medications, allergies, family history and social history reviewed with patient today and changes made to appropriate areas of the chart.   Review of Systems  Constitutional: Positive for diaphoresis. Negative for chills, fever, malaise/fatigue and weight loss.  HENT: Negative.   Eyes: Positive for blurred vision. Negative for double vision, photophobia, pain, discharge and redness.  Respiratory: Positive for shortness of breath (with exertion). Negative for cough, hemoptysis, sputum production and wheezing.   Cardiovascular: Positive for palpitations. Negative for chest pain, orthopnea, claudication, leg swelling and PND.  Gastrointestinal: Positive for heartburn, nausea and vomiting. Negative for abdominal pain, blood in stool, constipation, diarrhea and melena.  Genitourinary: Negative.        Peeing small amounts at a time   Musculoskeletal: Positive for back pain and myalgias. Negative for falls, joint pain and neck pain.  Skin: Negative.        Gets very dry skin  Neurological: Negative.   Endo/Heme/Allergies: Negative.   Psychiatric/Behavioral: Negative.     All other ROS negative except what is listed above and in the HPI.      Objective:    BP 125/74   Pulse 80   Temp 97.9 F (36.6 C) (Oral)   Ht 5' 3.5"  (1.613 m)   Wt 234 lb 1.6 oz (106.2 kg)   LMP 04/28/2018 (Exact Date)   SpO2 96%   BMI 40.82 kg/m   Wt Readings from Last 3 Encounters:  04/29/18 234 lb 1.6 oz (106.2 kg)  04/25/18 229 lb 14.4 oz (104.3 kg)  01/21/18 222 lb (100.7 kg)    Physical Exam Vitals signs  and nursing note reviewed. Exam conducted with a chaperone present.  Constitutional:      General: She is not in acute distress.    Appearance: Normal appearance. She is not ill-appearing, toxic-appearing or diaphoretic.  HENT:     Head: Normocephalic and atraumatic.     Right Ear: Tympanic membrane, ear canal and external ear normal. There is no impacted cerumen.     Left Ear: Tympanic membrane, ear canal and external ear normal. There is no impacted cerumen.     Nose: Nose normal. No congestion or rhinorrhea.     Mouth/Throat:     Mouth: Mucous membranes are moist.     Pharynx: Oropharynx is clear. No oropharyngeal exudate or posterior oropharyngeal erythema.  Eyes:     General: No scleral icterus.       Right eye: No discharge.        Left eye: No discharge.     Extraocular Movements: Extraocular movements intact.     Conjunctiva/sclera: Conjunctivae normal.     Pupils: Pupils are equal, round, and reactive to light.  Neck:     Musculoskeletal: Normal range of motion and neck supple. No neck rigidity or muscular tenderness.     Vascular: No carotid bruit.  Cardiovascular:     Rate and Rhythm: Normal rate and regular rhythm.     Pulses: Normal pulses.     Heart sounds: No murmur. No friction rub. No gallop.   Pulmonary:     Effort: Pulmonary effort is normal. No respiratory distress.     Breath sounds: Normal breath sounds. No stridor. No wheezing, rhonchi or rales.  Chest:     Chest wall: No tenderness.  Abdominal:     General: Abdomen is flat. Bowel sounds are normal. There is no distension.     Palpations: Abdomen is soft. There is no mass.     Tenderness: There is no abdominal tenderness. There is no  right CVA tenderness, left CVA tenderness, guarding or rebound.     Hernia: No hernia is present. There is no hernia in the right inguinal area or left inguinal area.  Genitourinary:    Labia:        Right: No rash, tenderness, lesion or injury.        Left: No rash, tenderness, lesion or injury.      Urethra: No prolapse, urethral pain, urethral swelling or urethral lesion.     Cervix: Cervical bleeding present. No cervical motion tenderness, discharge, friability, lesion, erythema or eversion.     Uterus: Normal. Not deviated, not enlarged, not fixed, not tender and no uterine prolapse.   Musculoskeletal:        General: No swelling, tenderness, deformity or signs of injury.     Right lower leg: No edema.     Left lower leg: No edema.     Comments: Redness resolved, still slightly swollen and tender  Lymphadenopathy:     Cervical: No cervical adenopathy.     Lower Body: No right inguinal adenopathy. No left inguinal adenopathy.  Skin:    General: Skin is warm and dry.     Capillary Refill: Capillary refill takes less than 2 seconds.     Coloration: Skin is not jaundiced or pale.     Findings: No bruising, erythema, lesion or rash.  Neurological:     General: No focal deficit present.     Mental Status: She is alert and oriented to person, place, and time. Mental status is at baseline.  Cranial Nerves: No cranial nerve deficit.     Sensory: No sensory deficit.     Motor: No weakness.     Coordination: Coordination normal.     Gait: Gait normal.     Deep Tendon Reflexes: Reflexes normal.  Psychiatric:        Mood and Affect: Mood normal.        Behavior: Behavior normal.        Thought Content: Thought content normal.        Judgment: Judgment normal.     Results for orders placed or performed during the hospital encounter of 06/25/17  Rapid strep screen  Result Value Ref Range   Streptococcus, Group A Screen (Direct) POSITIVE (A) NEGATIVE      Assessment & Plan:    Problem List Items Addressed This Visit      Digestive   Chronic hepatitis C virus infection ([place])    Spontaneously cleared. Rechecking viral load today.      Relevant Orders   Comprehensive metabolic panel   HCV RNA quant   Acute gastritis without hemorrhage    Checking labs today. Await results.       Relevant Orders   CBC with Differential/Platelet   Comprehensive metabolic panel     Other   Morbid obesity ([place])    Will check labs. Encouraged slow, steady weight loss of 1-2lbs per week. Continue to monitor.       Relevant Orders   Comprehensive metabolic panel   TSH   HgB A1c   Lipid Profile    Other Visit Diagnoses    Routine general medical examination at a health care facility    -  Primary   Vaccines up to date. Screening labs checked today. Pap done. Continue diet and exercise. Call with any concerns.    Relevant Orders   CBC with Differential/Platelet   Comprehensive metabolic panel   HCV RNA quant   HIV Antibody (routine testing w rflx)   TSH   Urinalysis, Routine w reflex microscopic   HgB A1c   Lipid Profile   Screening for cervical cancer       Pap done today.   Relevant Orders   Cytology - PAP   Cellulitis of left leg       Improving. Finish antibiotics. Call with any concerns.    Blurred vision       Will get her into opthalmology- referral made today.   Relevant Orders   Ambulatory referral to Ophthalmology       Follow up plan: Return in about 6 months (around 10/28/2018) for follow up.   LABORATORY TESTING:  - Pap smear: pap done  IMMUNIZATIONS:   - Tdap: Tetanus vaccination status reviewed: last tetanus booster within 10 years. - Influenza: Up to date - Pneumovax: Up to date  PATIENT COUNSELING:   Advised to take 1 mg of folate supplement per day if capable of pregnancy.   Sexuality: Discussed sexually transmitted diseases, partner selection, use of condoms, avoidance of unintended pregnancy  and contraceptive alternatives.    Advised to avoid cigarette smoking.  I discussed with the patient that most people either abstain from alcohol or drink within safe limits (<=14/week and <=4 drinks/occasion for males, <=7/weeks and <= 3 drinks/occasion for females) and that the risk for alcohol disorders and other health effects rises proportionally with the number of drinks per week and how often a drinker exceeds daily limits.  Discussed cessation/primary prevention of drug use and availability of treatment  for abuse.   Diet: Encouraged to adjust caloric intake to maintain  or achieve ideal body weight, to reduce intake of dietary saturated fat and total fat, to limit sodium intake by avoiding high sodium foods and not adding table salt, and to maintain adequate dietary potassium and calcium preferably from fresh fruits, vegetables, and low-fat dairy products.    stressed the importance of regular exercise  Injury prevention: Discussed safety belts, safety helmets, smoke detector, smoking near bedding or upholstery.   Dental health: Discussed importance of regular tooth brushing, flossing, and dental visits.    NEXT PREVENTATIVE PHYSICAL DUE IN 1 YEAR. Return in about 6 months (around 10/28/2018) for follow up.

## 2018-04-29 NOTE — Patient Instructions (Signed)

## 2018-04-29 NOTE — Addendum Note (Signed)
Addended by: Valerie Roys on: 04/29/2018 12:09 PM   Modules accepted: Orders

## 2018-04-29 NOTE — Assessment & Plan Note (Signed)
Will check labs. Encouraged slow, steady weight loss of 1-2lbs per week. Continue to monitor.

## 2018-04-29 NOTE — Assessment & Plan Note (Signed)
Spontaneously cleared. Rechecking viral load today.

## 2018-05-01 ENCOUNTER — Other Ambulatory Visit
Admission: RE | Admit: 2018-05-01 | Discharge: 2018-05-01 | Disposition: A | Discharge status: Home / Self Care | Payer: Medicaid Other | Source: Ambulatory Visit | Attending: Family Medicine | Admitting: Family Medicine

## 2018-05-01 DIAGNOSIS — Z0000 Encounter for general adult medical examination without abnormal findings: Secondary | ICD-10-CM

## 2018-05-01 DIAGNOSIS — B182 Chronic viral hepatitis C: Secondary | ICD-10-CM

## 2018-05-01 DIAGNOSIS — K2900 Acute gastritis without bleeding: Secondary | ICD-10-CM

## 2018-05-02 LAB — CYTOLOGY - PAP
Diagnosis: NEGATIVE
HPV: NOT DETECTED

## 2018-05-03 ENCOUNTER — Telehealth: Payer: Self-pay | Admitting: Family Medicine

## 2018-05-03 NOTE — Telephone Encounter (Signed)
We will need to see her for this.

## 2018-05-03 NOTE — Telephone Encounter (Signed)
Copied from [place]. Topic: General - Other >> May 02, 2018  4:14 PM Leward Quan A wrote: Reason for CRM: Patient called to say that when she came in for her appointment and was seen at the lab the needle was stuck down in her arm and now she is having sharp pains as if the person struck a nerve. Please advise.  Ph# [phone]

## 2018-05-03 NOTE — Telephone Encounter (Signed)
Noted  

## 2018-05-03 NOTE — Telephone Encounter (Addendum)
[employer], [facility] Supervisor regarding patient Katie Stanley right arm pain complaint due to attempted blood draw DOS 04/29/18. Olivia Mackie suggested that patient be seen for evaluation of right arm and that she would reach out to the patient on Monday, 1/27 to file an incident report. Also, called patient and advised as noted above. Offered patient the opportunity to go to an urgent care if she wanted to be seen today. Patient declined, as she prefers to be seen by Dr. Wynetta Emery. Appointment scheduled with Dr. Wynetta Emery on Tuesday, 05/07/18 as requested by patient.

## 2018-05-03 NOTE — Telephone Encounter (Signed)
Copied from [place]. Topic: General - Other >> May 02, 2018  4:14 PM Leward Quan A wrote: Reason for CRM: Patient called to say that when she came in for her appointment and was seen at the lab the needle was stuck down in her arm and now she is having sharp pains as if the person struck a nerve. Please advise.  Ph# [phone]   Contacted patient regarding her concerns and she would like to know what she should do about the pain in her arm.  Patient also  would like to know if the pain will go away or if it will be permanent.  Please call patient to advise. [phone].

## 2018-05-03 NOTE — Telephone Encounter (Signed)
Pt has an appt for 05/07/2018.

## 2018-05-07 ENCOUNTER — Ambulatory Visit: Payer: Medicaid Other | Admitting: Family Medicine

## 2018-05-07 ENCOUNTER — Encounter: Payer: Self-pay | Admitting: Family Medicine

## 2018-05-07 ENCOUNTER — Ambulatory Visit
Admission: RE | Admit: 2018-05-07 | Discharge: 2018-05-07 | Disposition: A | Discharge status: Home / Self Care | Payer: Medicaid Other | Source: Ambulatory Visit | Attending: Family Medicine | Admitting: Family Medicine

## 2018-05-07 VITALS — BP 149/84 | HR 71 | Temp 98.8°F | Ht 63.5 in | Wt 224.0 lb

## 2018-05-07 DIAGNOSIS — M79601 Pain in right arm: Secondary | ICD-10-CM

## 2018-05-07 DIAGNOSIS — M542 Cervicalgia: Secondary | ICD-10-CM

## 2018-05-07 MED ORDER — CYCLOBENZAPRINE HCL 10 MG PO TABS: 10.0000 mg | ORAL_TABLET | Freq: Every day | ORAL | 0 refills | Status: DC

## 2018-05-07 MED ORDER — NAPROXEN 500 MG PO TABS: 500.0000 mg | ORAL_TABLET | Freq: Two times a day (BID) | ORAL | 0 refills | Status: DC
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
## 2018-05-23 ENCOUNTER — Ambulatory Visit: Payer: Medicaid Other | Admitting: Family Medicine

## 2018-05-29 ENCOUNTER — Ambulatory Visit: Payer: Medicaid Other | Admitting: Gastroenterology

## 2018-06-11 ENCOUNTER — Ambulatory Visit: Payer: Medicaid Other | Admitting: Family Medicine

## 2018-06-20 ENCOUNTER — Other Ambulatory Visit: Payer: Self-pay

## 2018-06-20 ENCOUNTER — Ambulatory Visit: Payer: Medicaid Other | Admitting: Gastroenterology

## 2018-06-20 ENCOUNTER — Encounter: Payer: Self-pay | Admitting: Gastroenterology

## 2018-06-20 VITALS — BP 148/84 | HR 80 | Ht 63.5 in | Wt 224.6 lb

## 2018-06-20 DIAGNOSIS — G8929 Other chronic pain: Secondary | ICD-10-CM

## 2018-06-20 DIAGNOSIS — K219 Gastro-esophageal reflux disease without esophagitis: Secondary | ICD-10-CM

## 2018-06-20 DIAGNOSIS — R1013 Epigastric pain: Secondary | ICD-10-CM | POA: Diagnosis not present

## 2018-06-20 MED ORDER — PANTOPRAZOLE SODIUM 40 MG PO TBEC: 40.0000 mg | DELAYED_RELEASE_TABLET | Freq: Every day | ORAL | 6 refills | Status: DC

## 2018-06-20 NOTE — Progress Notes (Signed)
Primary Care Physician: Valerie Roys, DO  Primary Gastroenterologist:  Dr. Lucilla Lame  Chief Complaint  Patient presents with  . Abdominal Pain    HPI: Katie Stanley is a 33 y.o. female here who was seen me in the past for abdominal pain.  The patient had been found to have abdominal pain at the site of her surgical scar last time she was seen by me.  The patient also had an EGD in October of 2019 that showed her to have a hiatal hernia, gastritis and esophagitis.  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 Pylori.  The patient had previously been started on a PPI but reported that despite it making her feel better from a epigastric pain point of view she reported that it made her feel funny so she stopped it.  At the time of our last visit she had been taking anti-inflammatory medications for her back pains.     The patient also has a history of hepatitis C that appears to have spontaneously resolved.  The patient reports that she has been taking Omeprazole over-the-counter.  She David's infusion during this she is not having any further pain.  He is also not having any acid breakthrough.  She does report some looser bowel movements that he may be due to medication.  Current Outpatient Medications  Medication Sig Dispense Refill  . Biotin w/ Vitamins C & E (HAIR/SKIN/NAILS PO) Take by mouth.    . citalopram (CELEXA) 20 MG tablet Take 1.5 tablets (30 mg total) by mouth daily. 135 tablet 3  . cyclobenzaprine (FLEXERIL) 10 MG tablet Take 1 tablet (10 mg total) by mouth at bedtime. 30 tablet 0  . ibuprofen (ADVIL,MOTRIN) 800 MG tablet TAKE 1 TABLET(800 MG) BY MOUTH EVERY 8 HOURS AS NEEDED 90 tablet 3  . naproxen (NAPROSYN) 500 MG tablet Take 1 tablet (500 mg total) by mouth 2 (two) times daily with a meal. 60 tablet 0  . omeprazole (PRILOSEC) 40 MG capsule Take 1 capsule (40 mg total) by mouth daily. 30 capsule 1  . PARAGARD INTRAUTERINE  COPPER IU by Intrauterine route.    . SUBOXONE 8-2 MG FILM DISSOLVE 1 FILM UNT D X 7 DAYS  0   No current facility-administered medications for this visit.     Allergies as of 06/20/2018 - Review Complete 06/20/2018  Allergen Reaction Noted  . Penicillins Swelling 12/19/2014    ROS:  General: Negative for anorexia, weight loss, fever, chills, fatigue, weakness. ENT: Negative for hoarseness, difficulty swallowing , nasal congestion. CV: Negative for chest pain, angina, palpitations, dyspnea on exertion, peripheral edema.  Respiratory: Negative for dyspnea at rest, dyspnea on exertion, cough, sputum, wheezing.  GI: See history of present illness. GU:  Negative for dysuria, hematuria, urinary incontinence, urinary frequency, nocturnal urination.  Endo: Negative for unusual weight change.    Physical Examination:   BP (!) 148/84   Pulse 80   Ht 5' 3.5" (1.613 m)   Wt 224 lb 9.6 oz (101.9 kg)   BMI 39.16 kg/m   General: Well-nourished, well-developed in no acute distress.  Eyes: No icterus. Conjunctivae pink. Mouth: Oropharyngeal mucosa moist and pink , no lesions erythema or exudate. Lungs: Clear to auscultation bilaterally. Non-labored. Heart: Regular rate and rhythm, no murmurs rubs or gallops.  Abdomen: Bowel sounds are normal, nontender, nondistended, no hepatosplenomegaly or masses, no abdominal bruits or hernia , no rebound or guarding.   Extremities: No  lower extremity edema. No clubbing or deformities. Neuro: Alert and oriented x 3.  Grossly intact. Skin: Warm and dry, no jaundice.   Psych: Alert and cooperative, normal mood and affect.  Labs:    Imaging Studies: No results found.  Assessment and Plan:   Katie Stanley is a 33 y.o. y/o female who comes in today with a history of abdominal pain that has resolved on her PPI. The patient reports that her medication is expensive because he is buying it over-the-counter.  The patient also reports that her epigastric  pain has resolved. The patient will be started on Protonix 40 mg Once a day.  The patient has been explained the plan and agrees with it.    Lucilla Lame, MD. [name]   Note: This dictation was prepared with Dragon dictation along with smaller phrase technology. Any transcriptional errors that result from this process are unintentional.

## 2018-07-12 ENCOUNTER — Other Ambulatory Visit: Payer: Self-pay | Admitting: Family Medicine

## 2018-07-15 MED ORDER — CYCLOBENZAPRINE HCL 10 MG PO TABS: 10.0000 mg | ORAL_TABLET | Freq: Every day | ORAL | 0 refills | Status: DC

## 2018-07-15 MED ORDER — IBUPROFEN 800 MG PO TABS: ORAL_TABLET | ORAL | 3 refills | Status: DC

## 2018-09-27 ENCOUNTER — Ambulatory Visit: Payer: Self-pay | Admitting: Family Medicine

## 2018-09-27 NOTE — Telephone Encounter (Signed)
Pt called in c/o both of her ankles being swollen with the left foot and calf a little bigger than the right.   "It's always my left leg that is more swollen".  Walking without difficulty per pt.  No pain.   No answer when I tried to contact the [facility].  I sent these notes to the office high priority so they could call her back.   She needs to be seen per protocol within 4 hours.         Reason for Disposition . [1] Thigh, calf, or ankle swelling AND [2] bilateral AND [3] 1 side is more swollen  Answer Assessment - Initial Assessment Questions 1. LATION: "Which joint is swollen?"     My ankles are swelling.  Left worse than right 2. ONSET: "When did the swelling start?"     It'll last a couple of days and then go away the swelling.   Started Wednesday. 3. SIZE: "How large is the swelling?"     Can't see ankle bones.  Left foot looks like a golf ball on the inside of the foot.   I can walk.   It's not painful but uncomfortable.    My left leg mid calf down is red.  I have some scratches from my cat last night.   I have a place on my left shin that's been there a couple of years that looks like it's still healing.   4. PAIN: "Is there any pain?" If so, ask: "How bad is it?" (Scale 1-10; or mild, moderate, severe)     No 5. CAUSE: "What do you think caused the swollen joint?"     I don't know.   Maybe my heart, I don't know. 6. OTHER SYMPTOMS: "Do you have any other symptoms?" (e.g., fever, chest pain, difficulty breathing, calf pain)     I'm a smoker.   Left calf is not warmer than right.   My left leg usually swells the most anyway. 7. PREGNANCY: "Is there any chance you are pregnant?" "When was your last menstrual period?"     No.  I have an IUD.  Answer Assessment - Initial Assessment Questions 1. ONSET: "When did the swelling start?" (e.g., minutes, hours, days)     A couple of days ago. 2. LOCATION: "What part of the leg is swollen?"  "Are both legs swollen or just  one leg?"     Both ankles swollen but left is worse than right.  Left calf is bigger too. 3. SEVERITY: "How bad is the swelling?" (e.g., localized; mild, moderate, severe)  - Localized - small area of swelling localized to one leg  - MILD pedal edema - swelling limited to foot and ankle, pitting edema < 1/4 inch (6 mm) deep, rest and elevation eliminate most or all swelling  - MODERATE edema - swelling of lower leg to knee, pitting edema > 1/4 inch (6 mm) deep, rest and elevation only partially reduce swelling  - SEVERE edema - swelling extends above knee, facial or hand swelling present      Can't see ankle bones 4. REDNESS: "Does the swelling look red or infected?"     No 5. PAIN: "Is the swelling painful to touch?" If so, ask: "How painful is it?"   (Scale 1-10; mild, moderate or severe)     No 6. FEVER: "Do you have a fever?" If so, ask: "What is it, how was it measured, and when did it start?"  No 7. CAUSE: "What do you think is causing the leg swelling?"     I don't know.   Maybe my heart? 8. MEDICAL HISTORY: "Do you have a history of heart failure, kidney disease, liver failure, or cancer?"     No 9. RECURRENT SYMPTOM: "Have you had leg swelling before?" If so, ask: "When was the last time?" "What happened that time?"     This happened before and saw Dr. Wynetta Emery.   She gave me antibiotics that helped. 10. OTHER SYMPTOMS: "Do you have any other symptoms?" (e.g., chest pain, difficulty breathing)       No  But I'm a smoker 11. PREGNANCY: "Is there any chance you are pregnant?" "When was your last menstrual period?"       No  Protocols used: LEG SWELLING AND EDEMA-A-AH, ANKLE SWELLING-A-AH

## 2018-09-27 NOTE — Telephone Encounter (Signed)
Unfortunately, I don't have anything available this PM. She has had issues with her legs swelling before. If she's anxious about it- she can go to the urgent care. If this is the same that she's had before, I'd have her elevate her legs. If she gets any worse or is not getting better, go to the urgent care.

## 2018-09-27 NOTE — Telephone Encounter (Signed)
Please advise 

## 2018-09-27 NOTE — Telephone Encounter (Signed)
Patient notified, appt scheduled.

## 2018-10-01 ENCOUNTER — Ambulatory Visit: Payer: Medicaid Other | Admitting: Family Medicine

## 2018-10-01 ENCOUNTER — Other Ambulatory Visit: Payer: Self-pay

## 2018-10-01 ENCOUNTER — Encounter: Payer: Self-pay | Admitting: Family Medicine

## 2018-10-01 VITALS — BP 141/88 | HR 78 | Temp 98.8°F

## 2018-10-01 DIAGNOSIS — R609 Edema, unspecified: Secondary | ICD-10-CM | POA: Diagnosis not present

## 2018-10-01 DIAGNOSIS — L03116 Cellulitis of left lower limb: Secondary | ICD-10-CM

## 2018-10-01 MED ORDER — DOXYCYCLINE HYCLATE 100 MG PO TABS: 100.0000 mg | ORAL_TABLET | Freq: Two times a day (BID) | ORAL | 0 refills | Status: DC

## 2018-10-01 NOTE — Patient Instructions (Signed)
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
## 2018-10-02 ENCOUNTER — Other Ambulatory Visit: Payer: Medicaid Other

## 2018-10-02 ENCOUNTER — Telehealth: Payer: Self-pay | Admitting: Family Medicine

## 2018-10-02 LAB — BAYER DCA HB A1C WAIVED: HB A1C (BAYER DCA - WAIVED): 5.3 % (ref <7.0)

## 2018-10-02 NOTE — Telephone Encounter (Signed)
Patient notified and verbalized understanding. 

## 2018-10-02 NOTE — Telephone Encounter (Signed)
-----   Message from Georgina Peer, [place] sent at 10/02/2018  2:29 PM EDT ----- Lab has tried to get blood on patient 2 days, 3 people tried yesterday and 2 tried today. Katie Stanley wants to know if you want the patient to go somewhere else or what to do.

## 2018-10-02 NOTE — Telephone Encounter (Signed)
She can definitely go elsewhere. She can go to the hospital if she'd like- I don't know the skill of phlebotomists outside of our office.

## 2018-10-03 ENCOUNTER — Other Ambulatory Visit: Payer: Self-pay | Admitting: Family Medicine

## 2018-10-03 DIAGNOSIS — R609 Edema, unspecified: Secondary | ICD-10-CM

## 2018-10-03 DIAGNOSIS — K2900 Acute gastritis without bleeding: Secondary | ICD-10-CM

## 2018-10-03 DIAGNOSIS — R600 Localized edema: Secondary | ICD-10-CM

## 2018-10-04 ENCOUNTER — Ambulatory Visit: Payer: Medicaid Other

## 2018-10-05 ENCOUNTER — Encounter: Payer: Self-pay | Admitting: Family Medicine

## 2018-10-08 ENCOUNTER — Telehealth: Payer: Self-pay | Admitting: Family Medicine

## 2018-10-08 NOTE — Telephone Encounter (Signed)
I still don't have Katie Stanley's results- can we check to see if she went to the hospital to get her labs drawn?

## 2018-10-08 NOTE — Telephone Encounter (Signed)
Perfect. Thanks.

## 2018-10-08 NOTE — Telephone Encounter (Signed)
Called and spoke to patient. She states that she has not had labs drawn yet because she has had her son I believe she said and she did not want to take him to the hospital. Patient states that she is planning to go tomorrow after work.

## 2018-10-31 ENCOUNTER — Ambulatory Visit: Payer: Medicaid Other | Admitting: Family Medicine

## 2018-10-31 ENCOUNTER — Encounter: Payer: Self-pay | Admitting: Family Medicine

## 2018-10-31 ENCOUNTER — Other Ambulatory Visit: Payer: Self-pay

## 2018-10-31 VITALS — BP 127/84 | HR 73 | Temp 99.3°F | Ht 63.0 in | Wt 222.0 lb

## 2018-10-31 DIAGNOSIS — R609 Edema, unspecified: Secondary | ICD-10-CM | POA: Diagnosis not present

## 2018-10-31 NOTE — Progress Notes (Signed)
BP 127/84   Pulse 73   Temp 99.3 F (37.4 C) (Oral)   Ht 5\' 3"  (1.6 m)   Wt 222 lb (100.7 kg)   SpO2 95%   BMI 39.33 kg/m    Subjective:    Patient ID: Katie Stanley, female    DOB: [birth year], 33 y.o.   MRN: [id number]  HPI: Katie Stanley is a 33 y.o. female  Chief Complaint  Patient presents with  . Foot Swelling    f/u    [name] is feeling a lot better. She is not noticing as much pain. Swelling is better. No more redness. No more heat. She does note that her legs continue to swell at the end of the day. No SOB. No chest pain. She has not been able to get labs done due to issues getting to the hospital. She is otherwise doing well with no other concerns or complaints at this time.   Relevant past medical, surgical, family and social history reviewed and updated as indicated. Interim medical history since our last visit reviewed. Allergies and medications reviewed and updated.  Review of Systems  Constitutional: Negative.   Respiratory: Negative.   Cardiovascular: Positive for leg swelling. Negative for chest pain and palpitations.  Gastrointestinal: Negative.   Neurological: Negative.   Psychiatric/Behavioral: Negative.     Per HPI unless specifically indicated above     Objective:    BP 127/84   Pulse 73   Temp 99.3 F (37.4 C) (Oral)   Ht 5\' 3"  (1.6 m)   Wt 222 lb (100.7 kg)   SpO2 95%   BMI 39.33 kg/m   Wt Readings from Last 3 Encounters:  10/31/18 222 lb (100.7 kg)  06/20/18 224 lb 9.6 oz (101.9 kg)  05/07/18 224 lb (101.6 kg)    Physical Exam Vitals signs and nursing note reviewed.  Constitutional:      General: She is not in acute distress.    Appearance: Normal appearance. She is not ill-appearing, toxic-appearing or diaphoretic.  HENT:     Head: Normocephalic and atraumatic.     Right Ear: External ear normal.     Left Ear: External ear normal.     Nose: Nose normal.     Mouth/Throat:     Mouth: Mucous membranes are moist.   Pharynx: Oropharynx is clear.  Eyes:     General: No scleral icterus.       Right eye: No discharge.        Left eye: No discharge.     Extraocular Movements: Extraocular movements intact.     Conjunctiva/sclera: Conjunctivae normal.     Pupils: Pupils are equal, round, and reactive to light.  Neck:     Musculoskeletal: Normal range of motion and neck supple.  Cardiovascular:     Rate and Rhythm: Normal rate and regular rhythm.     Pulses: Normal pulses.     Heart sounds: Normal heart sounds. No murmur. No friction rub. No gallop.   Pulmonary:     Effort: Pulmonary effort is normal. No respiratory distress.     Breath sounds: Normal breath sounds. No stridor. No wheezing, rhonchi or rales.  Chest:     Chest wall: No tenderness.  Musculoskeletal: Normal range of motion.     Right lower leg: Edema (trace) present.     Left lower leg: Edema (trace) present.  Skin:    General: Skin is warm and dry.     Capillary Refill: Capillary refill takes  less than 2 seconds.     Coloration: Skin is not jaundiced or pale.     Findings: No bruising, erythema, lesion or rash.  Neurological:     General: No focal deficit present.     Mental Status: She is alert and oriented to person, place, and time. Mental status is at baseline.  Psychiatric:        Mood and Affect: Mood normal.        Behavior: Behavior normal.        Thought Content: Thought content normal.        Judgment: Judgment normal.     Results for orders placed or performed in visit on 10/01/18  Bayer DCA Hb A1c Waived  Result Value Ref Range   HB A1C (BAYER DCA - WAIVED) 5.3 <7.0 %      Assessment & Plan:   Problem List Items Addressed This Visit    None    Visit Diagnoses    Peripheral edema    -  Primary   Doing better. No more redness, trace swelling. Will get labs and if normal, will start HCTZ for swelling. Call with any concerns.        Follow up plan: Return 5-6 weeks, for follow up swelling.

## 2018-12-05 ENCOUNTER — Ambulatory Visit: Payer: Medicaid Other | Admitting: Family Medicine

## 2018-12-05 ENCOUNTER — Other Ambulatory Visit: Payer: Self-pay

## 2018-12-05 ENCOUNTER — Encounter: Payer: Self-pay | Admitting: Family Medicine

## 2018-12-05 DIAGNOSIS — R609 Edema, unspecified: Secondary | ICD-10-CM

## 2018-12-05 DIAGNOSIS — K2900 Acute gastritis without bleeding: Secondary | ICD-10-CM

## 2018-12-05 NOTE — Addendum Note (Signed)
Addended by: Valerie Roys on: 12/05/2018 02:25 PM   Modules accepted: Orders

## 2018-12-05 NOTE — Progress Notes (Signed)
Patient has been unable to get blood work or [place]. Will reschedule for when this is done.

## 2018-12-10 ENCOUNTER — Ambulatory Visit
Admission: RE | Admit: 2018-12-10 | Discharge: 2018-12-10 | Disposition: A | Discharge status: Home / Self Care | Payer: Medicaid Other | Source: Ambulatory Visit | Attending: Family Medicine | Admitting: Family Medicine

## 2018-12-10 ENCOUNTER — Other Ambulatory Visit: Payer: Self-pay

## 2018-12-10 ENCOUNTER — Other Ambulatory Visit
Admission: RE | Admit: 2018-12-10 | Discharge: 2018-12-10 | Disposition: A | Discharge status: Home / Self Care | Payer: Medicaid Other | Source: Ambulatory Visit | Attending: Family Medicine | Admitting: Family Medicine

## 2018-12-10 ENCOUNTER — Telehealth: Payer: Self-pay

## 2018-12-10 DIAGNOSIS — K2900 Acute gastritis without bleeding: Secondary | ICD-10-CM | POA: Insufficient documentation

## 2018-12-10 DIAGNOSIS — R609 Edema, unspecified: Secondary | ICD-10-CM | POA: Diagnosis present

## 2018-12-10 DIAGNOSIS — I879 Disorder of vein, unspecified: Secondary | ICD-10-CM

## 2018-12-10 NOTE — Telephone Encounter (Addendum)
I'm going to refer her to the cancer center to see if the blood doctors can recommend having a port put in. I don't want her to be anxious about them calling her- it's only because of our inability to get blood work on her.   You can also let her know that her Korea was normal. No sign of a DVT.

## 2018-12-10 NOTE — Telephone Encounter (Signed)
Patient notified of Dr. Johnson's message.  °

## 2018-12-10 NOTE — Telephone Encounter (Signed)
Copied from [place] [id number]. Topic: General - Inquiry >> Dec 10, 2018  3:48 PM Katie Stanley wrote: Reason for CRM:   Pt calling.  States that she has been trying to give blood since January but it seems to be physically impossible.  Pt would like to speak with Dr. Durenda Age assistant to know what to do about this.   Routing to provider for advice.

## 2018-12-19 ENCOUNTER — Encounter: Payer: Self-pay | Admitting: Oncology

## 2018-12-19 NOTE — Progress Notes (Unsigned)
Patient is new patient she would like to quit smoking. She is doing well. Her PCP is advising her to get a port placed.

## 2018-12-20 ENCOUNTER — Other Ambulatory Visit: Payer: Self-pay | Admitting: Family Medicine

## 2018-12-20 ENCOUNTER — Inpatient Hospital Stay: Payer: Medicaid Other | Admitting: Oncology

## 2018-12-20 DIAGNOSIS — I879 Disorder of vein, unspecified: Secondary | ICD-10-CM

## 2018-12-20 NOTE — Progress Notes (Signed)
Spoke with hematology via epic chat- they thought that vascular surgery was a more appropriate referral for this issue. Will put in referral to vascular.

## 2018-12-26 ENCOUNTER — Telehealth (INDEPENDENT_AMBULATORY_CARE_PROVIDER_SITE_OTHER): Payer: Self-pay

## 2018-12-26 NOTE — Telephone Encounter (Signed)
Spoke with the patient and she is now scheduled with Dr. Lucky Cowboy on 01/01/2019 for angio with a 10:15 am arrival time to the [facility]. Patient will do her Covid testing on 12/27/2018 between 12:30-2:30 pm at the [facility]. Patient understood and pre-procedure information will be mailed out to the patient.

## 2018-12-27 ENCOUNTER — Other Ambulatory Visit
Admission: RE | Admit: 2018-12-27 | Discharge: 2018-12-27 | Disposition: A | Discharge status: Home / Self Care | Payer: Medicaid Other | Source: Ambulatory Visit | Attending: Vascular Surgery | Admitting: Vascular Surgery

## 2018-12-27 ENCOUNTER — Other Ambulatory Visit: Payer: Self-pay

## 2018-12-27 DIAGNOSIS — Z20828 Contact with and (suspected) exposure to other viral communicable diseases: Secondary | ICD-10-CM | POA: Diagnosis not present

## 2018-12-27 DIAGNOSIS — Z01812 Encounter for preprocedural laboratory examination: Secondary | ICD-10-CM | POA: Diagnosis not present

## 2018-12-28 LAB — SARS CORONAVIRUS 2 (TAT 6-24 HRS): SARS Coronavirus 2: NEGATIVE

## 2018-12-31 ENCOUNTER — Other Ambulatory Visit (INDEPENDENT_AMBULATORY_CARE_PROVIDER_SITE_OTHER): Payer: Self-pay | Admitting: Nurse Practitioner

## 2019-01-01 ENCOUNTER — Other Ambulatory Visit: Payer: Self-pay

## 2019-01-01 ENCOUNTER — Encounter
Admission: RE | Disposition: A | Discharge status: Home / Self Care | Payer: Self-pay | Source: Home / Self Care | Attending: Vascular Surgery

## 2019-01-01 ENCOUNTER — Ambulatory Visit
Admission: RE | Admit: 2019-01-01 | Discharge: 2019-01-01 | Disposition: A | Discharge status: Home / Self Care | Payer: Medicaid Other | Attending: Vascular Surgery | Admitting: Vascular Surgery

## 2019-01-01 DIAGNOSIS — B1920 Unspecified viral hepatitis C without hepatic coma: Secondary | ICD-10-CM | POA: Diagnosis not present

## 2019-01-01 DIAGNOSIS — B182 Chronic viral hepatitis C: Secondary | ICD-10-CM

## 2019-01-01 DIAGNOSIS — I879 Disorder of vein, unspecified: Secondary | ICD-10-CM | POA: Insufficient documentation

## 2019-01-01 HISTORY — PX: PORTA CATH INSERTION: CATH118285

## 2019-01-01 LAB — PREGNANCY, URINE: Preg Test, Ur: NEGATIVE

## 2019-01-01 SURGERY — PORTA CATH INSERTION
Anesthesia: Moderate Sedation

## 2019-01-01 MED ORDER — MIDAZOLAM HCL 2 MG/2ML IJ SOLN
INTRAMUSCULAR | Status: DC | PRN
  Administered 2019-01-01 (×2): 2 mg via INTRAVENOUS

## 2019-01-01 MED ORDER — ONDANSETRON HCL 4 MG/2ML IJ SOLN: 4.0000 mg | Freq: Four times a day (QID) | INTRAMUSCULAR | Status: DC | PRN

## 2019-01-01 MED ORDER — HYDROMORPHONE HCL 1 MG/ML IJ SOLN: 1.0000 mg | Freq: Once | INTRAMUSCULAR | Status: DC | PRN

## 2019-01-01 MED ORDER — FENTANYL CITRATE (PF) 100 MCG/2ML IJ SOLN
INTRAMUSCULAR | Status: AC
  Filled 2019-01-01: qty 2

## 2019-01-01 MED ORDER — SODIUM CHLORIDE 0.9 % IV SOLN: INTRAVENOUS | Status: DC

## 2019-01-01 MED ORDER — MIDAZOLAM HCL 2 MG/2ML IJ SOLN
INTRAMUSCULAR | Status: AC
  Filled 2019-01-01: qty 2

## 2019-01-01 MED ORDER — DIPHENHYDRAMINE HCL 50 MG/ML IJ SOLN: 50.0000 mg | Freq: Once | INTRAMUSCULAR | Status: DC | PRN

## 2019-01-01 MED ORDER — MIDAZOLAM HCL 2 MG/ML PO SYRP
ORAL_SOLUTION | ORAL | Status: AC
  Filled 2019-01-01: qty 4

## 2019-01-01 MED ORDER — FENTANYL CITRATE (PF) 100 MCG/2ML IJ SOLN
INTRAMUSCULAR | Status: DC | PRN
  Administered 2019-01-01: 50 ug via INTRAVENOUS

## 2019-01-01 MED ORDER — SODIUM CHLORIDE 0.9 % IV SOLN
Freq: Once | INTRAVENOUS | Status: AC
  Administered 2019-01-01: 09:00:00
  Filled 2019-01-01: qty 80

## 2019-01-01 MED ORDER — CLINDAMYCIN PHOSPHATE 300 MG/50ML IV SOLN: 300.0000 mg | Freq: Once | INTRAVENOUS | Status: DC

## 2019-01-01 MED ORDER — MIDAZOLAM HCL 2 MG/ML PO SYRP
8.0000 mg | ORAL_SOLUTION | Freq: Once | ORAL | Status: AC | PRN
  Administered 2019-01-01: 8 mg via ORAL

## 2019-01-01 MED ORDER — FAMOTIDINE 20 MG PO TABS: 40.0000 mg | ORAL_TABLET | Freq: Once | ORAL | Status: DC | PRN

## 2019-01-01 MED ORDER — METHYLPREDNISOLONE SODIUM SUCC 125 MG IJ SOLR: 125.0000 mg | Freq: Once | INTRAMUSCULAR | Status: DC | PRN

## 2019-01-01 MED ORDER — CLINDAMYCIN PHOSPHATE 300 MG/50ML IV SOLN
INTRAVENOUS | Status: AC
  Filled 2019-01-01: qty 50

## 2019-01-01 SURGICAL SUPPLY — 11 items
CANNULA 5F STIFF (CANNULA) ×3 IMPLANT
DERMABOND ADVANCED (GAUZE/BANDAGES/DRESSINGS) ×2
DERMABOND ADVANCED .7 DNX12 (GAUZE/BANDAGES/DRESSINGS) ×1 IMPLANT
KIT PORT POWER 8FR ISP CVUE (Port) ×3 IMPLANT
PACK ANGIOGRAPHY (CUSTOM PROCEDURE TRAY) ×3 IMPLANT
PAD GROUND ADULT SPLIT (MISCELLANEOUS) ×3 IMPLANT
PENCIL ELECTRO HAND CTR (MISCELLANEOUS) ×3 IMPLANT
SPONGE XRAY 4X4 16PLY STRL (MISCELLANEOUS) ×3 IMPLANT
SUT MNCRL AB 4-0 PS2 18 (SUTURE) ×3 IMPLANT
SUT PROLENE 0 CT 1 30 (SUTURE) ×3 IMPLANT
SUT VICRYL+ 3-0 36IN CT-1 (SUTURE) ×3 IMPLANT

## 2019-01-01 NOTE — H&P (Signed)
[facility] History & Physical Update  The patient was interviewed and re-examined.  The patient's previous History and Physical has been reviewed and is unchanged.  There is no change in the plan of care. We plan to proceed with the scheduled procedure.  Leotis Pain, MD  01/01/2019, 8:28 AM

## 2019-01-01 NOTE — Op Note (Signed)
      [facility]       Operative Note  Date: 01/01/2019  Preoperative diagnosis:  1. Hepatitis C, substance abuse, poor venous access  Postoperative diagnosis:  Same as above  Procedures: #1. Ultrasound guidance for vascular access to the right internal jugular vein. #2. Fluoroscopic guidance for placement of catheter. #3. Placement of CT compatible Port-A-Cath, right internal jugular vein.  Surgeon: Leotis Pain, MD.   Anesthesia: Local with moderate conscious sedation for approximately 20  minutes using 4 mg of Versed and 50 mcg of Fentanyl  Fluoroscopy time: less than 1 minute  Contrast used: 0  Estimated blood loss: 10 cc  Indication for the procedure:  The patient is a 33 y.o.female with multiple issues and very difficult venous access.  The patient needs a Port-A-Cath for durable venous acces, lab draws, and CT scans. We are asked to place this. Risks and benefits were discussed and informed consent was obtained.  Description of procedure: The patient was brought to the vascular and interventional radiology suite.  Moderate conscious sedation was administered throughout the procedure during a face to face encounter with the patient with my supervision of the RN administering medicines and monitoring the patient's vital signs, pulse oximetry, telemetry and mental status throughout from the start of the procedure until the patient was taken to the recovery room. The right neck chest and shoulder were sterilely prepped and draped, and a sterile surgical field was created. Ultrasound was used to help visualize a patent right internal jugular vein. This was then accessed under direct ultrasound guidance without difficulty with the Seldinger needle and a permanent image was recorded. A J-wire was placed. After skin nick and dilatation, the peel-away sheath was then placed over the wire. I then anesthetized an area under the clavicle approximately 1-2 fingerbreadths. A  transverse incision was created and an inferior pocket was created with electrocautery and blunt dissection. The port was then brought onto the field, placed into the pocket and secured to the chest wall with 2 Prolene sutures. The catheter was connected to the port and tunneled from the subclavicular incision to the access site. Fluoroscopic guidance was then used to cut the catheter to an appropriate length. The catheter was then placed through the peel-away sheath and the peel-away sheath was removed. The catheter tip was parked in excellent location under fluorocoscopic guidance in the cavoatrial junction. The pocket was then irrigated with antibiotic impregnated saline and the wound was closed with a running 3-0 Vicryl and a 4-0 Monocryl. The access incision was closed with a single 4-0 Monocryl. The Huber needle was used to withdraw blood and flush the port with heparinized saline. Dermabond was then placed as a dressing. The patient tolerated the procedure well and was taken to the recovery room in stable condition.   Leotis Pain 01/01/2019 9:36 AM   This note was created with Dragon Medical transcription system. Any errors in dictation are purely unintentional.

## 2019-01-01 NOTE — Progress Notes (Signed)
MD notified that we were unable to get IV access with 3+ attempts. MD acknowledged, order to give PO versed at this time, MD stated will attempt access in procedural room.

## 2019-01-07 NOTE — H&P (Signed)
[facility] Admission History & Physical  MRN : [id number]  Katie Stanley is a 33 y.o. (1985/07/29) female who presents with chief complaint of No chief complaint on file. Marland Kitchen  History of Present Illness: Patient is sent over by her primary care physician Dr. Wynetta Emery for a port placement for very poor venous access.  She has a large number of medical issues and they are not even able to get labs for evaluation at her primary care physician's office.  Her medical issues as listed below including history of hepatitis C, gastroesophageal reflux disease, substance abuse, and anxiety and depression.  She is in her usual state of health today without specific complaints.  No current facility-administered medications for this encounter.    Current Outpatient Medications  Medication Sig Dispense Refill  . Biotin w/ Vitamins C & E (HAIR/SKIN/NAILS PO) Take by mouth.    . citalopram (CELEXA) 20 MG tablet Take 1.5 tablets (30 mg total) by mouth daily. 135 tablet 3  . cyclobenzaprine (FLEXERIL) 10 MG tablet Take 1 tablet (10 mg total) by mouth at bedtime. 30 tablet 0  . ibuprofen (ADVIL,MOTRIN) 800 MG tablet TAKE 1 TABLET(800 MG) BY MOUTH EVERY 8 HOURS AS NEEDED 90 tablet 3  . pantoprazole (PROTONIX) 40 MG tablet Take 1 tablet (40 mg total) by mouth daily. 30 tablet 6  . SUBOXONE 8-2 MG FILM DISSOLVE 1 FILM UNT D X 7 DAYS  0  . PARAGARD INTRAUTERINE COPPER IU by Intrauterine route.      Past Medical History:  Diagnosis Date  . Anxiety   . Biliary calculi 12/07/2009  . Cholelithiasis   . Depression   . GERD (gastroesophageal reflux disease)   . Hepatitis C    body "self healed" per patient  . Substance abuse [facility])     Past Surgical History:  Procedure Laterality Date  . CHOLECYSTECTOMY     2011  . ESOPHAGOGASTRODUODENOSCOPY (EGD) WITH PROPOFOL N/A 01/21/2018   Procedure: ESOPHAGOGASTRODUODENOSCOPY (EGD) WITH Biopsy;  Surgeon: Lucilla Lame, MD;  Location: [facility];  Service: Endoscopy;  Laterality: N/A;  . PORTA CATH INSERTION N/A 01/01/2019   Procedure: PORTA CATH INSERTION;  Surgeon: Algernon Huxley, MD;  Location: [facility];  Service: Cardiovascular;  Laterality: N/A;    Social History Social History   Tobacco Use  . Smoking status: Current Every Day Smoker    Packs/day: 1.00    Years: 12.00    Pack years: 12.00    Types: Cigarettes  . Smokeless tobacco: Never Used  Substance Use Topics  . Alcohol use: Yes    Alcohol/week: 20.0 standard drinks    Types: 20 Shots of liquor per week  . Drug use: Yes    Types: Marijuana    Family History Family History  Problem Relation Age of Onset  . Hypertension Mother   . Diabetes Father   . Liver cancer Maternal Grandmother   . Hypertension Maternal Grandfather   . Breast cancer Paternal Grandmother   . Heart disease Paternal Grandfather     Allergies  Allergen Reactions  . Penicillins Swelling     REVIEW OF SYSTEMS (Negative unless checked)  Constitutional: [] Weight loss  [] Fever  [] Chills Cardiac: [] Chest pain   [] Chest pressure   [] Palpitations   [] Shortness of breath when laying flat   [] Shortness of breath at rest   [] Shortness of breath with exertion. Vascular:  [] Pain in legs with walking   [] Pain in legs at rest   []   Pain in legs when laying flat   [] Claudication   [] Pain in feet when walking  [] Pain in feet at rest  [] Pain in feet when laying flat   [] History of DVT   [] Phlebitis   [] Swelling in legs   [] Varicose veins   [] Non-healing ulcers Pulmonary:   [] Uses home oxygen   [] Productive cough   [] Hemoptysis   [] Wheeze  [] COPD   [] Asthma Neurologic:  [] Dizziness  [] Blackouts   [] Seizures   [] History of stroke   [] History of TIA  [] Aphasia   [] Temporary blindness   [] Dysphagia   [] Weakness or numbness in arms   [] Weakness or numbness in legs Musculoskeletal:  [] Arthritis   [] Joint swelling   [] Joint pain   [] Low back pain Hematologic:  [] Easy bruising  [] Easy  bleeding   [] Hypercoagulable state   [] Anemic  [] Hepatitis Gastrointestinal:  [] Blood in stool   [] Vomiting blood  [x] Gastroesophageal reflux/heartburn   [] Difficulty swallowing. Genitourinary:  [] Chronic kidney disease   [] Difficult urination  [] Frequent urination  [] Burning with urination   [] Blood in urine Skin:  [] Rashes   [] Ulcers   [] Wounds Psychological:  [x] History of anxiety   [x]  History of major depression.  Physical Examination  Vitals:   01/01/19 0940 01/01/19 0945 01/01/19 1000 01/01/19 1015  BP: 111/63 117/61 (!) 108/47 (!) 121/57  Pulse: 61 83 80 78  Resp: 16 15 18 17   Temp:      TempSrc:      SpO2: 96% 96% 96% 95%  Weight:      Height:       Body mass index is 38.97 kg/m. Gen: WD/WN, NAD Head: [place]/AT, No temporalis wasting.  Ear/Nose/Throat: Hearing grossly intact, nares w/o erythema or drainage, oropharynx w/o Erythema/Exudate,  Eyes: Conjunctiva clear, sclera non-icteric Neck: Trachea midline.  No JVD.  Pulmonary:  Good air movement, respirations not labored, no use of accessory muscles.  Cardiac: RRR, no JVD Vascular:  Vessel Right Left  Radial Palpable Palpable   Musculoskeletal: M/S 5/5 throughout.  Extremities without ischemic changes.  No deformity or atrophy.  Neurologic: Sensation grossly intact in extremities.  Symmetrical.  Speech is fluent. Motor exam as listed above. Psychiatric: Judgment intact, Mood & affect appropriate for pt's clinical situation. Dermatologic: No rashes or ulcers noted.  No cellulitis or open wounds.      CBC Lab Results  Component Value Date   WBC 7.3 06/07/2016   HGB 12.7 06/07/2016   HCT 35.7 06/07/2016   MCV 81.5 06/07/2016   PLT 266 06/07/2016    BMET    Component Value Date/Time   NA 138 06/07/2016 1417   NA 140 01/06/2016 1017   K 4.0 06/07/2016 1417   CL 102 06/07/2016 1417   CO2 28 06/07/2016 1417   GLUCOSE 96 06/07/2016 1417   BUN 9 06/07/2016 1417   BUN 13 01/06/2016 1017   CREATININE 0.53  06/07/2016 1417   CALCIUM 8.8 (L) 06/07/2016 1417   GFRNONAA >60 06/07/2016 1417   GFRAA >60 06/07/2016 1417   CrCl cannot be calculated (Patient's most recent lab result is older than the maximum 21 days allowed.).  COAG No results found for: INR, PROTIME  Radiology US Venous Img Lower Unilateral Left  Result Date: 12/10/2018 CLINICAL DATA:  33 year old female with left lower extremity pain and swelling EXAM: LEFT LOWER EXTREMITY VENOUS DOPPLER ULTRASOUND TECHNIQUE: Gray-scale sonography with graded compression, as well as color Doppler and duplex ultrasound were performed to evaluate the lower extremity deep venous systems from the level  of the common femoral vein and including the common femoral, femoral, profunda femoral, popliteal and calf veins including the posterior tibial, peroneal and gastrocnemius veins when visible. The superficial great saphenous vein was also interrogated. Spectral Doppler was utilized to evaluate flow at rest and with distal augmentation maneuvers in the common femoral, femoral and popliteal veins. COMPARISON:  None. FINDINGS: Contralateral Common Femoral Vein: Respiratory phasicity is normal and symmetric with the symptomatic side. No evidence of thrombus. Normal compressibility. Common Femoral Vein: No evidence of thrombus. Normal compressibility, respiratory phasicity and response to augmentation. Saphenofemoral Junction: No evidence of thrombus. Normal compressibility and flow on color Doppler imaging. Profunda Femoral Vein: No evidence of thrombus. Normal compressibility and flow on color Doppler imaging. Femoral Vein: No evidence of thrombus. Normal compressibility, respiratory phasicity and response to augmentation. Popliteal Vein: No evidence of thrombus. Normal compressibility, respiratory phasicity and response to augmentation. Calf Veins: No evidence of thrombus. Normal compressibility and flow on color Doppler imaging. Superficial Great Saphenous Vein: No  evidence of thrombus. Normal compressibility. Venous Reflux:  None. Other Findings:  None. IMPRESSION: No evidence of deep venous thrombosis. Electronically Signed   By: Jacqulynn Cadet M.D.   On: 12/10/2018 16:16      Assessment/Plan 1.  Very poor venous access precluding lab draws or IV administrations.  We are requested to place a Port-A-Cath by her primary care physician and this will be done.  Risks and benefits are discussed 2.  History of hepatitis C without any current symptoms 3.  Gastroesophageal reflux disease stable on medical therapy.   Leotis Pain, MD  01/07/2019 12:23 PM

## 2019-01-08 ENCOUNTER — Other Ambulatory Visit: Payer: Self-pay | Admitting: Family Medicine

## 2019-02-11 ENCOUNTER — Other Ambulatory Visit: Payer: Self-pay | Admitting: Gastroenterology

## 2019-02-19 ENCOUNTER — Other Ambulatory Visit: Payer: Self-pay

## 2019-02-19 MED ORDER — HEPARIN SOD (PORK) LOCK FLUSH 100 UNIT/ML IV SOLN: 500.0000 [IU] | INTRAVENOUS | Status: DC | PRN

## 2019-02-19 MED ORDER — HEPARIN SOD (PORK) LOCK FLUSH 100 UNIT/ML IV SOLN: 500.0000 [IU] | INTRAVENOUS | Status: DC

## 2019-02-21 ENCOUNTER — Ambulatory Visit
Admission: RE | Admit: 2019-02-21 | Discharge: 2019-02-21 | Disposition: A | Discharge status: Home / Self Care | Payer: Medicaid Other | Source: Ambulatory Visit | Attending: Family Medicine | Admitting: Family Medicine

## 2019-02-21 ENCOUNTER — Telehealth: Payer: Self-pay | Admitting: Family Medicine

## 2019-02-21 ENCOUNTER — Other Ambulatory Visit: Payer: Self-pay | Admitting: Family Medicine

## 2019-02-21 ENCOUNTER — Other Ambulatory Visit: Payer: Self-pay

## 2019-02-21 ENCOUNTER — Encounter: Payer: Self-pay | Admitting: Family Medicine

## 2019-02-21 DIAGNOSIS — R7989 Other specified abnormal findings of blood chemistry: Secondary | ICD-10-CM

## 2019-02-21 DIAGNOSIS — R600 Localized edema: Secondary | ICD-10-CM

## 2019-02-21 DIAGNOSIS — F329 Major depressive disorder, single episode, unspecified: Secondary | ICD-10-CM

## 2019-02-21 DIAGNOSIS — Z114 Encounter for screening for human immunodeficiency virus [HIV]: Secondary | ICD-10-CM

## 2019-02-21 DIAGNOSIS — B182 Chronic viral hepatitis C: Secondary | ICD-10-CM

## 2019-02-21 DIAGNOSIS — K2900 Acute gastritis without bleeding: Secondary | ICD-10-CM

## 2019-02-21 DIAGNOSIS — R609 Edema, unspecified: Secondary | ICD-10-CM | POA: Insufficient documentation

## 2019-02-21 DIAGNOSIS — Z13220 Encounter for screening for lipoid disorders: Secondary | ICD-10-CM | POA: Diagnosis not present

## 2019-02-21 LAB — COMPREHENSIVE METABOLIC PANEL
ALT: 28 U/L (ref 0–44)
AST: 28 U/L (ref 15–41)
Albumin: 3.7 g/dL (ref 3.5–5.0)
Alkaline Phosphatase: 60 U/L (ref 38–126)
Anion gap: 10 (ref 5–15)
BUN: 8 mg/dL (ref 6–20)
CO2: 30 mmol/L (ref 22–32)
Calcium: 9 mg/dL (ref 8.9–10.3)
Chloride: 100 mmol/L (ref 98–111)
Creatinine, Ser: 0.58 mg/dL (ref 0.44–1.00)
GFR calc Af Amer: >60 mL/min (ref >60)
GFR calc non Af Amer: >60 mL/min (ref >60)
Glucose, Bld: 91 mg/dL (ref 70–99)
Potassium: 3.8 mmol/L (ref 3.5–5.1)
Sodium: 140 mmol/L (ref 135–145)
Total Bilirubin: 0.4 mg/dL (ref 0.3–1.2)
Total Protein: 6.6 g/dL (ref 6.5–8.1)

## 2019-02-21 LAB — LIPID PANEL
Cholesterol: 198 mg/dL (ref 0–200)
HDL: 37 mg/dL — ABNORMAL LOW (ref >40)
LDL Cholesterol: 83 mg/dL (ref 0–99)
Total CHOL/HDL Ratio: 5.4 RATIO
Triglycerides: 388 mg/dL — ABNORMAL HIGH (ref <150)
VLDL: 78 mg/dL — ABNORMAL HIGH (ref 0–40)

## 2019-02-21 LAB — CBC WITH DIFFERENTIAL/PLATELET
Abs Immature Granulocytes: 0.05 10*3/uL (ref 0.00–0.07)
Basophils Absolute: 0.1 10*3/uL (ref 0.0–0.1)
Basophils Relative: 1 %
Eosinophils Absolute: 0.2 10*3/uL (ref 0.0–0.5)
Eosinophils Relative: 2 %
HCT: 37.6 % (ref 36.0–46.0)
Hemoglobin: 12.9 g/dL (ref 12.0–15.0)
Immature Granulocytes: 1 %
Lymphocytes Relative: 26 %
Lymphs Abs: 2.5 10*3/uL (ref 0.7–4.0)
MCH: 30.9 pg (ref 26.0–34.0)
MCHC: 34.3 g/dL (ref 30.0–36.0)
MCV: 90 fL (ref 80.0–100.0)
Monocytes Absolute: 0.7 10*3/uL (ref 0.1–1.0)
Monocytes Relative: 7 %
Neutro Abs: 6.4 10*3/uL (ref 1.7–7.7)
Neutrophils Relative %: 63 %
Platelets: 214 10*3/uL (ref 150–400)
RBC: 4.18 MIL/uL (ref 3.87–5.11)
RDW: 13 % (ref 11.5–15.5)
WBC: 9.9 10*3/uL (ref 4.0–10.5)
nRBC: 0 % (ref 0.0–0.2)

## 2019-02-21 LAB — TSH: TSH: 1.361 u[IU]/mL (ref 0.350–4.500)

## 2019-02-21 LAB — RAPID HIV SCREEN (HIV 1/2 AB+AG)
HIV 1/2 Antibodies: NONREACTIVE
HIV-1 P24 Antigen - HIV24: NONREACTIVE

## 2019-02-21 LAB — HM HIV SCREENING LAB: HM HIV Screening: NEGATIVE

## 2019-02-21 LAB — BRAIN NATRIURETIC PEPTIDE: B Natriuretic Peptide: 174 pg/mL — ABNORMAL HIGH (ref 0.0–100.0)

## 2019-02-21 MED ORDER — HEPARIN SOD (PORK) LOCK FLUSH 100 UNIT/ML IV SOLN
INTRAVENOUS | Status: AC
  Filled 2019-02-21: qty 5

## 2019-02-21 MED ORDER — SODIUM CHLORIDE FLUSH 0.9 % IV SOLN
INTRAVENOUS | Status: AC
  Filled 2019-02-21: qty 10

## 2019-02-21 NOTE — Telephone Encounter (Signed)
Patient notified by phone.

## 2019-02-21 NOTE — Telephone Encounter (Signed)
Patient notified

## 2019-02-21 NOTE — Telephone Encounter (Signed)
Please let her know that the marker suggesting her swelling may be due to her heart not pumping right was elevated. I'm going to get her set up for an Korea of her heart. They should be calling her.

## 2019-02-22 LAB — HCV RNA QUANT: HCV Quantitative: NOT DETECTED IU/mL (ref >50)

## 2019-02-25 ENCOUNTER — Encounter: Payer: Self-pay | Admitting: Family Medicine

## 2019-03-10 ENCOUNTER — Ambulatory Visit
Admission: RE | Admit: 2019-03-10 | Discharge: 2019-03-10 | Disposition: A | Discharge status: Home / Self Care | Payer: Medicaid Other | Source: Ambulatory Visit | Attending: Family Medicine | Admitting: Family Medicine

## 2019-03-10 ENCOUNTER — Other Ambulatory Visit: Payer: Self-pay

## 2019-03-10 DIAGNOSIS — R7989 Other specified abnormal findings of blood chemistry: Secondary | ICD-10-CM

## 2019-03-10 DIAGNOSIS — R609 Edema, unspecified: Secondary | ICD-10-CM

## 2019-03-10 NOTE — Progress Notes (Signed)
*  PRELIMINARY RESULTS* Echocardiogram 2D Echocardiogram has been performed.  Katie Stanley 03/10/2019, 10:34 AM

## 2019-04-02 ENCOUNTER — Ambulatory Visit
Admission: RE | Admit: 2019-04-02 | Discharge: 2019-04-02 | Disposition: A | Discharge status: Home / Self Care | Payer: Medicaid Other | Source: Ambulatory Visit | Attending: Family Medicine | Admitting: Family Medicine

## 2019-04-02 ENCOUNTER — Other Ambulatory Visit: Payer: Self-pay

## 2019-04-02 DIAGNOSIS — Z452 Encounter for adjustment and management of vascular access device: Secondary | ICD-10-CM | POA: Diagnosis not present

## 2019-04-02 MED ORDER — SODIUM CHLORIDE FLUSH 0.9 % IV SOLN
INTRAVENOUS | Status: AC
  Administered 2019-04-02: 10 mL via INTRAVENOUS
  Filled 2019-04-02: qty 10

## 2019-04-02 MED ORDER — HEPARIN SOD (PORK) LOCK FLUSH 100 UNIT/ML IV SOLN: 500.0000 [IU] | Freq: Once | INTRAVENOUS | Status: AC

## 2019-04-02 MED ORDER — SODIUM CHLORIDE 0.9% FLUSH: 10.0000 mL | Freq: Once | INTRAVENOUS | Status: AC

## 2019-04-02 MED ORDER — HEPARIN SOD (PORK) LOCK FLUSH 100 UNIT/ML IV SOLN
INTRAVENOUS | Status: AC
  Administered 2019-04-02: 500 [IU] via INTRAVENOUS
  Filled 2019-04-02: qty 5

## 2019-04-22 ENCOUNTER — Other Ambulatory Visit: Payer: Self-pay | Admitting: Family Medicine

## 2019-05-02 ENCOUNTER — Ambulatory Visit: Payer: Self-pay | Admitting: *Deleted

## 2019-05-02 NOTE — Telephone Encounter (Signed)
Pt called with complaints of her heart "pounding", HR 100, and BP 137/96; she also has a headache; she says her back and chest are sore to the touch; the soreness and weakness have been going on for 2-3 days; her BP is normally n the 120s; her BP has been 140s/90-100 since 04/29/19; the pt says her heart pounding started on 04/1719; she is aiso having muscle cramping in her legs and back, and back since 04/30/19; she says her headache has been constant for 3-4 days rated 2 out of 10; the pt says she is also having weakness; recommendations made per nurse triage protocol; she verbalized understanding; the pt sees Dr Park Liter, [facility]; will route to office for notification.  Reason for Disposition . Dizziness, lightheadedness, or weakness  Answer Assessment - Initial Assessment Questions 1. DESCRIPTION: "Please describe your heart rate or heart beat that you are having" (e.g., fast/slow, regular/irregular, skipped or extra beats, "palpitations")     "pounding" and fast; fluttering at times since 04/27/19 2. ONSET: "When did it start?" (Minutes, hours or days)      1717/21 3. DURATION: "How long does it last" (e.g., seconds, minutes, hours)     days 4. PATTERN "Does it come and go, or has it been constant since it started?"  "Does it get worse with exertion?"   "Are you feeling it now?"     constant 5. TAP: "Using your hand, can you tap out what you are feeling on a chair or table in front of you, so that I can hear?" (Note: not all patients can do this)       6. HEART RATE: "Can you tell me your heart rate?" "How many beats in 15 seconds?"  (Note: not all patients can do this)        7. RECURRENT SYMPTOM: "Have you ever had this before?" If so, ask: "When was the last time?" and "What happened that time?"     no 8. CAUSE: "What do you think is causing the palpitations?"     Not sure 9. CARDIAC HISTORY: "Do you have any history of heart disease?" (e.g., heart attack, angina, bypass  surgery, angioplasty, arrhythmia)     History of heart fluttering 10. OTHER SYMPTOMS: "Do you have any other symptoms?" (e.g., dizziness, chest pain, sweating, difficulty breathing)       Headache, chest/back sore to the touch; cramps in legs and back 11. PREGNANCY: "Is there any chance you are pregnant?" "When was your last menstrual period?"  Protocols used: [place]

## 2019-05-08 ENCOUNTER — Encounter: Payer: Self-pay | Admitting: Family Medicine

## 2019-05-12 ENCOUNTER — Encounter: Payer: Self-pay | Admitting: Family Medicine

## 2019-05-12 ENCOUNTER — Ambulatory Visit (INDEPENDENT_AMBULATORY_CARE_PROVIDER_SITE_OTHER): Payer: Medicaid Other | Admitting: Family Medicine

## 2019-05-12 ENCOUNTER — Other Ambulatory Visit: Payer: Self-pay

## 2019-05-12 VITALS — BP 136/96 | HR 100 | Temp 99.2°F

## 2019-05-12 DIAGNOSIS — R000 Tachycardia, unspecified: Secondary | ICD-10-CM

## 2019-05-12 NOTE — Progress Notes (Signed)
BP (!) 136/96   Pulse 100   Temp 99.2 F (37.3 C)   SpO2 98%    Subjective:    Patient ID: Katie Stanley, female    DOB: 10-10-1985, 34 y.o.   MRN: [id number]  HPI: Katie Stanley is a 34 y.o. female  Chief Complaint  Patient presents with  . Palpitations   PALPITATIONS Duration: 3 weeks  Symptom description: skipping beats, heart racing, feels like it's "slurping" Duration of episode: seconds Frequency: every 10-15 seconds Activity when event occurred: at random Related to exertion: no Dyspnea: yes Chest pain: no Syncope: no Anxiety/stress: yes Nausea/vomiting: yes Diaphoresis: yes Coronary artery disease: no Congestive heart failure: no Arrhythmia:no Thyroid disease: no Caffeine intake: Heavy caffeine consumption (2-3 mountain dews a day- was doing monsters) Status:  stable Treatments attempted:none  Relevant past medical, surgical, family and social history reviewed and updated as indicated. Interim medical history since our last visit reviewed. Allergies and medications reviewed and updated.  Review of Systems  Constitutional: Negative.   HENT: Negative.   Respiratory: Positive for shortness of breath. Negative for apnea, cough, choking, chest tightness, wheezing and stridor.   Cardiovascular: Positive for palpitations. Negative for chest pain and leg swelling.  Gastrointestinal: Negative.   Neurological: Negative.   Psychiatric/Behavioral: Negative.     Per HPI unless specifically indicated above     Objective:    BP (!) 136/96   Pulse 100   Temp 99.2 F (37.3 C)   SpO2 98%   Wt Readings from Last 3 Encounters:  01/01/19 220 lb (99.8 kg)  10/31/18 222 lb (100.7 kg)  06/20/18 224 lb 9.6 oz (101.9 kg)    Physical Exam Vitals and nursing note reviewed.  Constitutional:      General: She is not in acute distress.    Appearance: Normal appearance. She is well-developed. She is not ill-appearing, toxic-appearing or diaphoretic.  HENT:   Head: Normocephalic and atraumatic.     Right Ear: Hearing and external ear normal.     Left Ear: Hearing and external ear normal.     Nose: Nose normal.     Mouth/Throat:     Mouth: Mucous membranes are moist.     Pharynx: Oropharynx is clear.  Eyes:     General: Lids are normal. No scleral icterus.       Right eye: No discharge.        Left eye: No discharge.     Extraocular Movements: Extraocular movements intact.     Conjunctiva/sclera: Conjunctivae normal.     Pupils: Pupils are equal, round, and reactive to light.  Cardiovascular:     Rate and Rhythm: Regular rhythm. Tachycardia present.     Pulses: Normal pulses.     Heart sounds: Normal heart sounds. No murmur. No friction rub. No gallop.      Comments: Mildly tachycardic at about 100bpm Pulmonary:     Effort: Pulmonary effort is normal. No respiratory distress.     Breath sounds: Normal breath sounds. No stridor. No wheezing, rhonchi or rales.  Chest:     Chest wall: No tenderness.  Musculoskeletal:        General: Normal range of motion.     Cervical back: Normal range of motion and neck supple.  Skin:    General: Skin is warm and dry.     Capillary Refill: Capillary refill takes less than 2 seconds.     Coloration: Skin is not jaundiced or pale.  Findings: No bruising, erythema, lesion or rash.  Neurological:     General: No focal deficit present.     Mental Status: She is alert and oriented to person, place, and time. Mental status is at baseline.  Psychiatric:        Mood and Affect: Mood normal.        Speech: Speech normal.        Behavior: Behavior normal.        Thought Content: Thought content normal.        Judgment: Judgment normal.     Results for orders placed or performed in visit on 05/12/19  [facility] HIV SCREENING LAB  Result Value Ref Range   [facility] HIV Screening Negative - Patient reported       Assessment & Plan:   Problem List Items Addressed This Visit    None    Visit Diagnoses     Tachycardia    -  Primary   Likely due to a combination of caffiene and anxiety, but will check labs and holter monitor. Await results and treat as needed. Call with any concerns.    Relevant Orders   CBC with Differential/Platelet   Comprehensive metabolic panel   TSH   EKG 12-Lead (Completed)   Holter monitor - 48 hour       Follow up plan: Return if symptoms worsen or fail to improve.

## 2019-05-14 ENCOUNTER — Other Ambulatory Visit
Admission: RE | Admit: 2019-05-14 | Discharge: 2019-05-14 | Disposition: A | Discharge status: Home / Self Care | Payer: Medicaid Other | Source: Home / Self Care | Attending: Family Medicine | Admitting: Family Medicine

## 2019-05-14 ENCOUNTER — Other Ambulatory Visit: Payer: Self-pay

## 2019-05-14 ENCOUNTER — Ambulatory Visit
Admission: RE | Admit: 2019-05-14 | Discharge: 2019-05-14 | Disposition: A | Discharge status: Home / Self Care | Payer: Medicaid Other | Source: Ambulatory Visit | Attending: Family Medicine | Admitting: Family Medicine

## 2019-05-14 DIAGNOSIS — R000 Tachycardia, unspecified: Secondary | ICD-10-CM | POA: Diagnosis not present

## 2019-05-14 LAB — CBC WITH DIFFERENTIAL/PLATELET
Abs Immature Granulocytes: 0.05 10*3/uL (ref 0.00–0.07)
Basophils Absolute: 0.1 10*3/uL (ref 0.0–0.1)
Basophils Relative: 1 %
Eosinophils Absolute: 0.2 10*3/uL (ref 0.0–0.5)
Eosinophils Relative: 2 %
HCT: 40.8 % (ref 36.0–46.0)
Hemoglobin: 13.9 g/dL (ref 12.0–15.0)
Immature Granulocytes: 1 %
Lymphocytes Relative: 28 %
Lymphs Abs: 2.6 10*3/uL (ref 0.7–4.0)
MCH: 31.4 pg (ref 26.0–34.0)
MCHC: 34.1 g/dL (ref 30.0–36.0)
MCV: 92.1 fL (ref 80.0–100.0)
Monocytes Absolute: 0.5 10*3/uL (ref 0.1–1.0)
Monocytes Relative: 6 %
Neutro Abs: 5.9 10*3/uL (ref 1.7–7.7)
Neutrophils Relative %: 62 %
Platelets: 215 10*3/uL (ref 150–400)
RBC: 4.43 MIL/uL (ref 3.87–5.11)
RDW: 13.3 % (ref 11.5–15.5)
WBC: 9.3 10*3/uL (ref 4.0–10.5)
nRBC: 0 % (ref 0.0–0.2)

## 2019-05-14 LAB — COMPREHENSIVE METABOLIC PANEL
ALT: 42 U/L (ref 0–44)
AST: 46 U/L — ABNORMAL HIGH (ref 15–41)
Albumin: 4 g/dL (ref 3.5–5.0)
Alkaline Phosphatase: 57 U/L (ref 38–126)
Anion gap: 13 (ref 5–15)
BUN: 11 mg/dL (ref 6–20)
CO2: 32 mmol/L (ref 22–32)
Calcium: 8.9 mg/dL (ref 8.9–10.3)
Chloride: 93 mmol/L — ABNORMAL LOW (ref 98–111)
Creatinine, Ser: 0.66 mg/dL (ref 0.44–1.00)
GFR calc Af Amer: >60 mL/min (ref >60)
GFR calc non Af Amer: >60 mL/min (ref >60)
Glucose, Bld: 96 mg/dL (ref 70–99)
Potassium: 3.7 mmol/L (ref 3.5–5.1)
Sodium: 138 mmol/L (ref 135–145)
Total Bilirubin: 0.6 mg/dL (ref 0.3–1.2)
Total Protein: 7.4 g/dL (ref 6.5–8.1)

## 2019-05-14 LAB — TSH: TSH: 1.447 u[IU]/mL (ref 0.350–4.500)

## 2019-05-14 MED ORDER — HEPARIN SOD (PORK) LOCK FLUSH 100 UNIT/ML IV SOLN
INTRAVENOUS | Status: AC
  Filled 2019-05-14: qty 5

## 2019-05-14 MED ORDER — HEPARIN SOD (PORK) LOCK FLUSH 100 UNIT/ML IV SOLN: 500.0000 [IU] | Freq: Once | INTRAVENOUS | Status: DC

## 2019-05-14 MED ORDER — SODIUM CHLORIDE 0.9% FLUSH: 10.0000 mL | Freq: Once | INTRAVENOUS | Status: DC

## 2019-05-19 ENCOUNTER — Other Ambulatory Visit: Payer: Self-pay

## 2019-05-19 ENCOUNTER — Ambulatory Visit
Admission: RE | Admit: 2019-05-19 | Discharge: 2019-05-19 | Disposition: A | Discharge status: Home / Self Care | Payer: Medicaid Other | Source: Ambulatory Visit | Attending: Family Medicine | Admitting: Family Medicine

## 2019-05-19 DIAGNOSIS — R000 Tachycardia, unspecified: Secondary | ICD-10-CM | POA: Insufficient documentation

## 2019-05-19 DIAGNOSIS — I493 Ventricular premature depolarization: Secondary | ICD-10-CM | POA: Diagnosis not present

## 2019-06-20 ENCOUNTER — Encounter: Payer: Self-pay | Admitting: Family Medicine

## 2019-06-20 NOTE — Telephone Encounter (Signed)
I think the results are in the chart under encounter 05/19/19

## 2019-06-25 ENCOUNTER — Ambulatory Visit
Admission: RE | Admit: 2019-06-25 | Discharge: 2019-06-25 | Disposition: A | Discharge status: Home / Self Care | Payer: Medicaid Other | Source: Ambulatory Visit | Attending: Family Medicine | Admitting: Family Medicine

## 2019-06-25 ENCOUNTER — Other Ambulatory Visit: Payer: Self-pay

## 2019-06-25 DIAGNOSIS — Z452 Encounter for adjustment and management of vascular access device: Secondary | ICD-10-CM | POA: Diagnosis not present

## 2019-06-25 MED ORDER — HEPARIN SOD (PORK) LOCK FLUSH 100 UNIT/ML IV SOLN: 500.0000 [IU] | Freq: Once | INTRAVENOUS | Status: AC

## 2019-06-25 MED ORDER — HEPARIN SOD (PORK) LOCK FLUSH 100 UNIT/ML IV SOLN
INTRAVENOUS | Status: AC
  Administered 2019-06-25: 500 [IU] via INTRAVENOUS
  Filled 2019-06-25: qty 5

## 2019-06-25 MED ORDER — HEPARIN SOD (PORK) LOCK FLUSH 100 UNIT/ML IV SOLN
INTRAVENOUS | Status: AC
  Filled 2019-06-25: qty 5

## 2019-06-25 MED ORDER — SODIUM CHLORIDE 0.9% FLUSH: 10.0000 mL | Freq: Once | INTRAVENOUS | Status: AC

## 2019-06-25 MED ORDER — SODIUM CHLORIDE FLUSH 0.9 % IV SOLN
INTRAVENOUS | Status: AC
  Administered 2019-06-25: 13:00:00 10 mL via INTRAVENOUS
  Filled 2019-06-25: qty 10

## 2019-07-03 ENCOUNTER — Telehealth: Payer: Self-pay | Admitting: Family Medicine

## 2019-07-03 DIAGNOSIS — R609 Edema, unspecified: Secondary | ICD-10-CM

## 2019-07-03 DIAGNOSIS — R000 Tachycardia, unspecified: Secondary | ICD-10-CM

## 2019-07-03 NOTE — Telephone Encounter (Signed)
Please let her know that her holter monitor came back normal. If she'd like I can get her into see cardiology so we can find out what's going on. Thanks!

## 2019-07-03 NOTE — Telephone Encounter (Signed)
Patient notified.  Please refer to cardiology

## 2019-07-11 ENCOUNTER — Ambulatory Visit (INDEPENDENT_AMBULATORY_CARE_PROVIDER_SITE_OTHER): Payer: Medicaid Other | Admitting: Family Medicine

## 2019-07-11 ENCOUNTER — Other Ambulatory Visit: Payer: Self-pay

## 2019-07-11 ENCOUNTER — Encounter: Payer: Self-pay | Admitting: Family Medicine

## 2019-07-11 VITALS — BP 148/88 | HR 71 | Temp 98.1°F | Ht 63.0 in | Wt 220.2 lb

## 2019-07-11 DIAGNOSIS — K439 Ventral hernia without obstruction or gangrene: Secondary | ICD-10-CM | POA: Diagnosis not present

## 2019-07-11 DIAGNOSIS — Z30430 Encounter for insertion of intrauterine contraceptive device: Secondary | ICD-10-CM | POA: Diagnosis not present

## 2019-07-11 DIAGNOSIS — R102 Pelvic and perineal pain: Secondary | ICD-10-CM | POA: Diagnosis not present

## 2019-07-11 LAB — PREGNANCY, URINE: Preg Test, Ur: NEGATIVE

## 2019-07-11 NOTE — Progress Notes (Signed)
BP (!) 148/88 (BP Location: Left Arm, Patient Position: Sitting, Cuff Size: Normal)   Pulse 71   Temp 98.1 F (36.7 C) (Oral)   Ht 5\' 3"  (1.6 m)   Wt 220 lb 3.2 oz (99.9 kg)   SpO2 96%   BMI 39.01 kg/m    Subjective:    Patient ID: Katie Stanley, female    DOB: 05/11/1985, 34 y.o.   MRN: [id number]  HPI: Katie Stanley is a 34 y.o. female  Chief Complaint  Patient presents with  . Contraception    IUD concerns  . Hernia   Kasidi comes in today concerned for pelvic cramping. She notes that she has had some really bad cramps for about a few weeks. Around the time she's having her period. Never like this before. Rest of her periods have been pretty normal.   Not a regular flow right now- lighter. 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 Pain is aching and cramping in nature. It comes and goes. Nothing makes it better or worse. It doesn't radiate.  Has a hernia. Not hurting. Only notices it when she sits up. No other concerns or complaints at this time.   Relevant past medical, surgical, family and social history reviewed and updated as indicated. Interim medical history since our last visit reviewed. Allergies and medications reviewed and updated.  Review of Systems  Constitutional: Negative.   Respiratory: Negative.   Cardiovascular: Negative.   Genitourinary: Positive for menstrual problem, pelvic pain and vaginal pain. Negative for decreased urine volume, difficulty urinating, dyspareunia, dysuria, enuresis, flank pain, frequency, genital sores, hematuria, urgency, vaginal bleeding and vaginal discharge.  Musculoskeletal: Negative.   Neurological: Negative.   Psychiatric/Behavioral: Negative.     Per HPI unless specifically indicated above     Objective:    BP (!) 148/88 (BP Location: Left Arm, Patient Position: Sitting, Cuff Size: Normal)   Pulse 71   Temp 98.1 F (36.7 C) (Oral)   Ht 5\' 3"   (1.6 m)   Wt 220 lb 3.2 oz (99.9 kg)   SpO2 96%   BMI 39.01 kg/m   Wt Readings from Last 3 Encounters:  07/11/19 220 lb 3.2 oz (99.9 kg)  01/01/19 220 lb (99.8 kg)  10/31/18 222 lb (100.7 kg)    Physical Exam Vitals and nursing note reviewed.  Constitutional:      General: She is not in acute distress.    Appearance: Normal appearance. She is not ill-appearing, toxic-appearing or diaphoretic.  HENT:     Head: Normocephalic and atraumatic.     Right Ear: External ear normal.     Left Ear: External ear normal.     Nose: Nose normal.     Mouth/Throat:     Mouth: Mucous membranes are moist.     Pharynx: Oropharynx is clear.  Eyes:     General: No scleral icterus.       Right eye: No discharge.        Left eye: No discharge.     Extraocular Movements: Extraocular movements intact.     Conjunctiva/sclera: Conjunctivae normal.     Pupils: Pupils are equal, round, and reactive to light.  Cardiovascular:     Rate and Rhythm: Normal rate and regular rhythm.     Pulses: Normal pulses.     Heart sounds: Normal heart sounds. No murmur. No friction rub. No gallop.   Pulmonary:  Effort: Pulmonary effort is normal. No respiratory distress.     Breath sounds: Normal breath sounds. No stridor. No wheezing, rhonchi or rales.  Chest:     Chest wall: No tenderness.  Abdominal:     General: Abdomen is flat. Bowel sounds are normal. There is no distension.     Palpations: Abdomen is soft. There is no mass.     Tenderness: There is no abdominal tenderness. There is no right CVA tenderness, left CVA tenderness, guarding or rebound.     Hernia: A hernia (small ventral hernia) is present.  Musculoskeletal:        General: Normal range of motion.     Cervical back: Normal range of motion and neck supple.  Skin:    General: Skin is warm and dry.     Capillary Refill: Capillary refill takes less than 2 seconds.     Coloration: Skin is not jaundiced or pale.     Findings: No bruising,  erythema, lesion or rash.  Neurological:     General: No focal deficit present.     Mental Status: She is alert and oriented to person, place, and time. Mental status is at baseline.  Psychiatric:        Mood and Affect: Mood normal.        Behavior: Behavior normal.        Thought Content: Thought content normal.        Judgment: Judgment normal.     Results for orders placed or performed in visit on 07/11/19  Pregnancy, urine  Result Value Ref Range   Preg Test, Ur Negative Negative      Assessment & Plan:   Problem List Items Addressed This Visit    None    Visit Diagnoses    Pelvic cramping    -  Primary   UA and pregnancy negative. Will check [place] and get her into GYN. Call with any concerns.    Relevant Orders   Pregnancy, urine (Completed)   US Pelvic Complete With Transvaginal   Encounter for insertion of copper IUD       Due to have her paraguard removed and replaced- will refer to GYN. Call with any concerns.    Relevant Orders   Ambulatory referral to Obstetrics / Gynecology   Ventral hernia without obstruction or gangrene       Very small. Reassured patient. Call with any concerns.        Follow up plan: Return if symptoms worsen or fail to improve.

## 2019-07-11 NOTE — Patient Instructions (Addendum)
Ventral Hernia  A ventral hernia is a bulge of tissue from inside the abdomen that pushes through a weak area of the muscles that form the front wall of the abdomen. The tissues inside the abdomen are inside a sac (peritoneum). These tissues include the small intestine, large intestine, and the fatty tissue that covers the intestines (omentum). Sometimes, the bulge that forms a hernia contains intestines. Other hernias contain only fat. Ventral hernias do not go away without surgical treatment. There are several types of ventral hernias. You may have:  A hernia at an incision site from previous abdominal surgery (incisional hernia).  A hernia just above the belly button (epigastric hernia), or at the belly button (umbilical hernia). These types of hernias can develop from heavy lifting or straining.  A hernia that comes and goes (reducible hernia). It may be visible only when you lift or strain. This type of hernia can be pushed back into the abdomen (reduced).  A hernia that traps abdominal tissue inside the hernia (incarcerated hernia). This type of hernia does not reduce.  A hernia that cuts off blood flow to the tissues inside the hernia (strangulated hernia). The tissues can start to die if this happens. This is a very painful bulge that cannot be reduced. A strangulated hernia is a medical emergency. What are the causes? This condition is caused by abdominal tissue putting pressure on an area of weakness in the abdominal muscles. What increases the risk? The following factors may make you more likely to develop this condition:  Being female.  Being 60 or older.  Being overweight or obese.  Having had previous abdominal surgery, especially if there was an infection after surgery.  Having had an injury to the abdominal wall.  Having had several pregnancies.  Having a buildup of fluid inside the abdomen (ascites). What are the signs or symptoms? The only symptom of a ventral hernia  may be a painless bulge in the abdomen. A reducible hernia may be visible only when you strain, cough, or lift. Other symptoms may include:  Dull pain.  A feeling of pressure. Signs and symptoms of a strangulated hernia may include:  Increasing pain.  Nausea and vomiting.  Pain when pressing on the hernia.  The skin over the hernia turning red or purple.  Constipation.  Blood in the stool (feces). How is this diagnosed? This condition may be diagnosed based on:  Your symptoms.  Your medical history.  A physical exam. You may be asked to cough or strain while standing. These actions increase the pressure inside your abdomen and force the hernia through the opening in your muscles. Your health care provider may try to reduce the hernia by pressing on it.  Imaging studies, such as an ultrasound or CT scan. How is this treated? This condition is treated with surgery. If you have a strangulated hernia, surgery is done as soon as possible. If your hernia is small and not incarcerated, you may be asked to lose some weight before surgery. Follow these instructions at home:  Follow instructions from your health care provider about eating or drinking restrictions.  If you are overweight, your health care provider may recommend that you increase your activity level and eat a healthier diet.  Do not lift anything that is heavier than 10 lb (4.5 kg).  Return to your normal activities as told by your health care provider. Ask your health care provider what activities are safe for you. You may need to avoid activities   that increase pressure on your hernia.  Take over-the-counter and prescription medicines only as told by your health care provider.  Keep all follow-up visits as told by your health care provider. This is important. Contact a health care provider if:  Your hernia gets larger.  Your hernia becomes painful. Get help right away if:  Your hernia becomes increasingly  painful.  You have pain along with any of the following: ? Changes in skin color in the area of the hernia. ? Nausea. ? Vomiting. ? Fever. Summary  A ventral hernia is a bulge of tissue from inside the abdomen that pushes through a weak area of the muscles that form the front wall of the abdomen.  This condition is treated with surgery, which may be urgent depending on your hernia.  Do not lift anything that is heavier than 10 lb (4.5 kg), and follow activity instructions from your health care provider. This information is not intended to replace advice given to you by your health care provider. Make sure you discuss any questions you have with your health care provider. Document Revised: 05/09/2017 Document Reviewed: 10/16/2016 Elsevier Patient Education  [place]  Diastasis recti is when the muscles of the abdomen (rectus abdominis muscles) become thin and separate. The result is a wider space between the right and left abdomen (abdominal) muscles. This wider space between the muscles may cause a bulge in the middle of your abdomen. You may notice this bulge when you are straining or when you sit up from a lying down position. Diastasis recti can affect men and women. It is most common among pregnant women, infants, people who are obese, and people who have had abdominal surgery. Exercise or surgical treatment may help correct it. What are the causes? Common causes of this condition include:  Pregnancy. The growing uterus puts pressure on the abdominal muscles, which causes the muscles to separate.  Obesity. Excess fat puts pressure on abdominal muscles.  Weightlifting.  Some abdomen exercises.  Advanced age.  Genetics.  Prior abdominal surgery. What increases the risk? This condition is more likely to develop in:  Women.  Newborns, especially newborns who are born early (prematurely). What are the signs or symptoms? Common symptoms of this  condition include:  A bulge in the middle of the abdomen. You will notice it most when you sit up or strain.  Pain in the low back, pelvis, or hips.  Constipation.  Inability to control when you urinate (urinary incontinence).  Bloating.  Poor posture. How is this diagnosed? This condition is diagnosed with a physical exam. Your health care provider will ask you to lie flat on your back and do a crunch or half sit-up. If you have diastasis recti, a vertical bulge will appear between your abdominal muscles in the center of your abdomen. Your health care provider will measure the gap between your muscles with one of the following:  A medical device used to measure the space between two objects (caliper).  A tape measure.  CT scan.  Ultrasound.  Finger spaces. Your health care provider will measure the space using their fingers. How is this treated? If your muscle separation is not too large, you may not need treatment. However, if you are a woman who plans to become pregnant again, you should treat this condition before your next pregnancy. Treatment may include:  Physical therapy to strengthen and tighten your abdominal muscles.  Lifestyle changes such as weight loss and exercise.  Over-the-counter pain medicines as needed.  Surgery to correct the separation. Follow these instructions at home: Activity  Return to your normal activities as told by your health care provider. Ask your health care provider what activities are safe for you.  When lifting weights or doing exercises using your abdominal muscles or the muscles in the center of your body that give stability (core muscles), make sure you are doing your exercises and movements correctly. Proper form can help to prevent the condition from happening again. General instructions  If you are overweight, ask your health care provider for help with weight loss. Losing even a small amount of weight can help to improve your  diastasis recti.  Take over-the-counter or prescription medicines only as told by your health care provider.  Do not strain. Straining can make the separation worse. Examples of straining include: ? Pushing hard to have a bowel movement, such as due to constipation. ? Lifting heavy objects, including children. ? Standing up and sitting down.  Take steps to prevent constipation: ? Drink enough fluid to keep your urine clear or pale yellow. ? Take over-the-counter or prescription medicines only as directed. ? Eat foods that are high in fiber, such as fresh fruits and vegetables, whole grains, and beans. ? Limit foods that are high in fat and processed sugars, such as fried and sweet foods. Contact a health care provider if:  You notice a new bulge in your abdomen. Get help right away if:  You experience severe discomfort in your abdomen.  You develop severe abdominal pain along with nausea, vomiting, or fever. Summary  Diastasis recti is when the abdomen (abdominal) muscles become thin and separate. Your abdomen will stick out because the space between your right and left abdomen muscles has widened.  The most common symptom is a bulge in your abdomen. You will notice it most when you sit up or are straining.  This condition is diagnosed during a physical exam.  If the abdomen separation is not too big, you may choose not to have treatment. Otherwise, you may need to undergo physical therapy or surgery. This information is not intended to replace advice given to you by your health care provider. Make sure you discuss any questions you have with your health care provider. Document Revised: 03/09/2017 Document Reviewed: 05/22/2016 Elsevier Patient Education  2020 [employer].

## 2019-07-12 ENCOUNTER — Encounter: Payer: Self-pay | Admitting: Family Medicine

## 2019-07-15 ENCOUNTER — Other Ambulatory Visit: Payer: Self-pay

## 2019-07-15 MED ORDER — CITALOPRAM HYDROBROMIDE 20 MG PO TABS: 30.0000 mg | ORAL_TABLET | Freq: Every day | ORAL | 1 refills | Status: DC

## 2019-07-18 ENCOUNTER — Ambulatory Visit
Admission: RE | Admit: 2019-07-18 | Discharge: 2019-07-18 | Disposition: A | Discharge status: Home / Self Care | Payer: Medicaid Other | Source: Ambulatory Visit | Attending: Family Medicine | Admitting: Family Medicine

## 2019-07-18 ENCOUNTER — Other Ambulatory Visit: Payer: Self-pay

## 2019-07-18 DIAGNOSIS — N939 Abnormal uterine and vaginal bleeding, unspecified: Secondary | ICD-10-CM | POA: Diagnosis not present

## 2019-07-18 DIAGNOSIS — R102 Pelvic and perineal pain: Secondary | ICD-10-CM | POA: Insufficient documentation

## 2019-07-22 ENCOUNTER — Other Ambulatory Visit: Payer: Self-pay

## 2019-07-22 ENCOUNTER — Encounter: Payer: Self-pay | Admitting: Cardiovascular Disease

## 2019-07-22 ENCOUNTER — Ambulatory Visit (INDEPENDENT_AMBULATORY_CARE_PROVIDER_SITE_OTHER): Payer: Medicaid Other | Admitting: Cardiovascular Disease

## 2019-07-22 ENCOUNTER — Other Ambulatory Visit: Payer: Self-pay | Admitting: Cardiovascular Disease

## 2019-07-22 DIAGNOSIS — R600 Localized edema: Secondary | ICD-10-CM | POA: Insufficient documentation

## 2019-07-22 DIAGNOSIS — I479 Paroxysmal tachycardia, unspecified: Secondary | ICD-10-CM

## 2019-07-22 MED ORDER — METOPROLOL TARTRATE 25 MG PO TABS: 25.0000 mg | ORAL_TABLET | Freq: Two times a day (BID) | ORAL | 1 refills | Status: DC | PRN

## 2019-07-22 NOTE — Patient Instructions (Addendum)
Compression hose on left leg Could be a vein issue, could be lymph issue  Medication Instructions:  For PVCs (premature ventricular complex) Take metoprolol tartarte 25 mg up to twice a day as needed    If you need a refill on your cardiac medications before your next appointment, please call your pharmacy.    Lab work: No new labs needed   If you have labs (blood work) drawn today and your tests are completely normal, you will receive your results only by: Marland Kitchen MyChart Message (if you have MyChart) OR . A paper copy in the mail If you have any lab test that is abnormal or we need to change your treatment, we will call you to review the results.   Testing/Procedures: No new testing needed   Follow-Up: At [facility], you and your health needs are our priority.  As part of our continuing mission to provide you with exceptional heart care, we have created designated Provider Care Teams.  These Care Teams include your primary Cardiologist (physician) and Advanced Practice Providers (APPs -  Physician Assistants and Nurse Practitioners) who all work together to provide you with the care you need, when you need it.  . You will need a follow up appointment as needed   . Providers on your designated Care Team:   . Murray Hodgkins, NP . Christell Faith, PA-C . Marrianne Mood, PA-C  Any Other Special Instructions Will Be Listed Below (If Applicable).  For educational health videos Log in to : [email or web address] Or : [email or web address], password : [email or web address]

## 2019-07-22 NOTE — Progress Notes (Signed)
Cardiology Office Note  Date:  07/22/2019   ID:  Katie Stanley, DOB 1985-08-27, MRN [id number]  PCP:  Valerie Roys, DO   Chief Complaint  Patient presents with  . OTHER    Tachycardia c/o skipped beats. Meds reviewed verbally with pt.    HPI:  [name] is a 34 year old woman with past medical history of Obesity Hepatitis C GERD Anxiety Referred by [facility] for consultation of her tachycardia, edema  Reports having long history of palpitations Had a Holter monitor 48 hours back in February 2021 Monitor results reviewed with her in detail showing PVCs, around 1200 Rare APCs  Same symptoms when she had EKG done today, that showed PVC Symptoms seem to come and go, very bothersome, she is symptomatic  Left left swelling , chronic issue, dating back several years Had trauma to left leg, sore,  Since then has had chronic swelling sometimes much worse than it was on today's visit  Prior records reviewed Echocardiogram November 2020 Ejection fraction 50 to 55%  Holter monitor Feb 2021 showing normal sinus rhythm PVCs, PACs  EKG personally reviewed by myself on todays visit Shows normal sinus rhythm rare PVC no significant ST-T wave changes rate 82 bpm   PMH:   has a past medical history of Anxiety, Biliary calculi (12/07/2009), Cholelithiasis, Depression, GERD (gastroesophageal reflux disease), Hepatitis C, and Substance abuse ([place]).  PSH:    Past Surgical History:  Procedure Laterality Date  . CHOLECYSTECTOMY     2011  . ESOPHAGOGASTRODUODENOSCOPY (EGD) WITH PROPOFOL N/A 01/21/2018   Procedure: ESOPHAGOGASTRODUODENOSCOPY (EGD) WITH Biopsy;  Surgeon: Lucilla Lame, MD;  Location: [place];  Service: Endoscopy;  Laterality: N/A;  . PORTA CATH INSERTION N/A 01/01/2019   Procedure: PORTA CATH INSERTION;  Surgeon: Algernon Huxley, MD;  Location: [place] [facility];  Service: Cardiovascular;  Laterality: N/A;    Current Outpatient Medications   Medication Sig Dispense Refill  . Biotin w/ Vitamins C & E (HAIR/SKIN/NAILS PO) Take by mouth.    . citalopram (CELEXA) 20 MG tablet Take 1.5 tablets (30 mg total) by mouth daily. 135 tablet 1  . cyclobenzaprine (FLEXERIL) 10 MG tablet Take 1 tablet (10 mg total) by mouth at bedtime. (Patient taking differently: Take 10 mg by mouth daily as needed. ) 30 tablet 0  . ibuprofen (ADVIL,MOTRIN) 800 MG tablet TAKE 1 TABLET(800 MG) BY MOUTH EVERY 8 HOURS AS NEEDED 90 tablet 3  . pantoprazole (PROTONIX) 40 MG tablet TAKE 1 TABLET BY MOUTH DAILY 30 tablet 6  . PARAGARD INTRAUTERINE COPPER IU by Intrauterine route.    . SUBOXONE 8-2 MG FILM DISSOLVE 1 FILM UNT D X 7 DAYS  0  . metoprolol tartrate (LOPRESSOR) 25 MG tablet Take 1 tablet (25 mg total) by mouth 2 (two) times daily as needed. 60 tablet 1   No current facility-administered medications for this visit.     Allergies:   Penicillins   Social History:  The patient  reports that she has been smoking cigarettes. She has a 12.00 pack-year smoking history. She has never used smokeless tobacco. She reports current alcohol use of about 20.0 standard drinks of alcohol per week. She reports current drug use. Drug: Marijuana.   Family History:   family history includes Breast cancer in her paternal grandmother; Diabetes in her father; Heart disease in her paternal grandfather; Hypertension in her father, maternal grandfather, and mother; Liver cancer in her maternal grandmother.    Review of Systems: Review  of Systems  Constitutional: Negative.   HENT: Negative.   Respiratory: Negative.   Cardiovascular: Positive for palpitations and leg swelling.  Gastrointestinal: Negative.   Musculoskeletal: Negative.   Neurological: Negative.   Psychiatric/Behavioral: Negative.   All other systems reviewed and are negative.   PHYSICAL EXAM: VS:  BP 134/90 (BP Location: Left Arm, Patient Position: Sitting, Cuff Size: Normal)   Pulse 82   Ht 5\' 3"  (1.6 m)    Wt 220 lb 2 oz (99.8 kg)   SpO2 98%   BMI 38.99 kg/m  , BMI Body mass index is 38.99 kg/m. GEN: Well nourished, well developed, in no acute distress HEENT: normal Neck: no JVD, carotid bruits, or masses Cardiac: RRR; no murmurs, rubs, or gallops, trace nonpitting lower extremity edema left lower extremity Respiratory:  clear to auscultation bilaterally, normal work of breathing GI: soft, nontender, nondistended, + BS MS: no deformity or atrophy Skin: warm and dry, no rash Neuro:  Strength and sensation are intact Psych: euthymic mood, full affect   Recent Labs: 02/21/2019: B Natriuretic Peptide 174.0 05/14/2019: ALT 42; BUN 11; Creatinine, Ser 0.66; Hemoglobin 13.9; Platelets 215; Potassium 3.7; Sodium 138; TSH 1.447    Lipid Panel Lab Results  Component Value Date   CHOL 198 02/21/2019   HDL 37 (L) 02/21/2019   LDLCALC 83 02/21/2019   TRIG 388 (H) 02/21/2019      Wt Readings from Last 3 Encounters:  07/22/19 220 lb 2 oz (99.8 kg)  07/11/19 220 lb 3.2 oz (99.9 kg)  01/01/19 220 lb (99.8 kg)       ASSESSMENT AND PLAN:  Problem List Items Addressed This Visit      Cardiology Problems   Paroxysmal tachycardia (HCC)   Relevant Medications   metoprolol tartrate (LOPRESSOR) 25 MG tablet   Other Relevant Orders   EKG 12-Lead     Other   Leg edema     Leg edema Left leg, likely lymphedema or venous insufficiency Suspect exacerbated by trauma in the past to her left lower extremity Right leg no symptoms Recommend compression hose, symptoms will likely be worse in the heat, when standing or sitting for long periods of time  Palpitations/PVCs Seen on EKG today, she was symptomatic also seen on Holter monitor Discussed with her, medications daily versus as needed versus no medications She prefers to have a pill she can take as needed if symptoms get worse We have given her metoprolol tartrate 25 mg p.o. twice daily as needed If symptoms get worse recommend she  call our office -Prior echocardiogram normal, no further testing needed  Disposition:   F/U as needed   Total encounter time more than 60 minutes  Greater than 50% was spent in counseling and coordination of care with the patient    Signed, Esmond Plants, M.D., Ph.D. [place], [place]

## 2019-07-28 ENCOUNTER — Ambulatory Visit: Payer: Medicaid Other | Admitting: Family Medicine

## 2019-08-06 ENCOUNTER — Ambulatory Visit
Admission: RE | Admit: 2019-08-06 | Discharge: 2019-08-06 | Disposition: A | Discharge status: Home / Self Care | Payer: Medicaid Other | Source: Ambulatory Visit | Attending: Family Medicine | Admitting: Family Medicine

## 2019-08-06 ENCOUNTER — Other Ambulatory Visit: Payer: Self-pay

## 2019-08-06 DIAGNOSIS — Z01812 Encounter for preprocedural laboratory examination: Secondary | ICD-10-CM | POA: Insufficient documentation

## 2019-08-06 DIAGNOSIS — Z452 Encounter for adjustment and management of vascular access device: Secondary | ICD-10-CM | POA: Insufficient documentation

## 2019-08-06 MED ORDER — HEPARIN SOD (PORK) LOCK FLUSH 100 UNIT/ML IV SOLN
INTRAVENOUS | Status: AC
  Filled 2019-08-06: qty 5

## 2019-08-06 MED ORDER — SODIUM CHLORIDE 0.9% FLUSH: 10.0000 mL | Freq: Once | INTRAVENOUS | Status: DC

## 2019-08-06 MED ORDER — HEPARIN SOD (PORK) LOCK FLUSH 100 UNIT/ML IV SOLN: 500.0000 [IU] | Freq: Once | INTRAVENOUS | Status: DC

## 2019-09-15 ENCOUNTER — Encounter: Payer: Self-pay | Admitting: Emergency Medicine

## 2019-09-15 ENCOUNTER — Other Ambulatory Visit: Payer: Self-pay

## 2019-09-15 ENCOUNTER — Ambulatory Visit
Admission: EM | Admit: 2019-09-15 | Discharge: 2019-09-15 | Disposition: A | Discharge status: Home / Self Care | Payer: Medicaid Other

## 2019-09-15 DIAGNOSIS — H9202 Otalgia, left ear: Secondary | ICD-10-CM | POA: Diagnosis not present

## 2019-09-15 NOTE — ED Provider Notes (Signed)
[facility]    CSN: [id number] Arrival date & time: 09/15/19  1541      History   Chief Complaint Chief Complaint  Patient presents with  . Otalgia    left    HPI Katie Stanley is a 34 y.o. female. who present with complaint of L ear x 5-6 days and feels there is a noise in her ear. Woke up with a slight sore throat a couple of days ago ,but denies URI or fever.     Past Medical History:  Diagnosis Date  . Anxiety   . Biliary calculi 12/07/2009  . Cholelithiasis   . Depression   . GERD (gastroesophageal reflux disease)   . Hepatitis C    body "self healed" per patient  . Substance abuse [facility])     Patient Active Problem List   Diagnosis Date Noted  . Paroxysmal tachycardia ([place]) 07/22/2019  . Leg edema 07/22/2019  . Morbid obesity ([place]) 04/29/2018  . Abdominal pain, epigastric   . Acute gastritis without hemorrhage   . Reflux esophagitis   . Gastroesophageal reflux disease 12/25/2017  . Chronic bilateral thoracic back pain 06/15/2016  . 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 Anxiety 02/25/2015  . 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 Chronic hepatitis C virus infection ([place]) 07/07/2009    Past Surgical History:  Procedure Laterality Date  . CHOLECYSTECTOMY     2011  . ESOPHAGOGASTRODUODENOSCOPY (EGD) WITH PROPOFOL N/A 01/21/2018   Procedure: ESOPHAGOGASTRODUODENOSCOPY (EGD) WITH Biopsy;  Surgeon: Lucilla Lame, MD;  Location: [place];  Service: Endoscopy;  Laterality: N/A;  . PORTA CATH INSERTION N/A 01/01/2019   Procedure: PORTA CATH INSERTION;  Surgeon: Algernon Huxley, MD;  Location: [place] [facility];  Service: Cardiovascular;  Laterality: N/A;    OB History   No obstetric history on file.      Home Medications    Prior to Admission medications   Medication Sig Start Date End Date Taking? Authorizing Provider  Biotin w/ Vitamins C & E (HAIR/SKIN/NAILS PO) Take by mouth.   Yes [provider]    citalopram (CELEXA) 20 MG tablet Take 1.5 tablets (30 mg total) by mouth daily. 07/15/19  Yes Johnson, Megan P, DO  metoprolol tartrate (LOPRESSOR) 25 MG tablet TAKE 1 TABLET(25 MG) BY MOUTH TWICE DAILY AS NEEDED 07/22/19  Yes Gollan, Kathlene November, MD  pantoprazole (PROTONIX) 40 MG tablet TAKE 1 TABLET BY MOUTH DAILY 02/11/19  Yes Lucilla Lame, MD  [facility] INTRAUTERINE COPPER IU by Intrauterine route.   Yes [provider]  SUBOXONE 8-2 MG FILM DISSOLVE 1 FILM UNT D X 7 DAYS 12/13/17  Yes [provider]  cyclobenzaprine (FLEXERIL) 10 MG tablet Take 1 tablet (10 mg total) by mouth at bedtime. Patient taking differently: Take 10 mg by mouth daily as needed.  07/15/18   Johnson, Megan P, DO  ibuprofen (ADVIL,MOTRIN) 800 MG tablet TAKE 1 TABLET(800 MG) BY MOUTH EVERY 8 HOURS AS NEEDED 07/15/18   Valerie Roys, DO    Family History Family History  Problem Relation Age of Onset  . Hypertension Mother   . Diabetes Father   . Hypertension Father   . Liver cancer Maternal Grandmother   . Hypertension Maternal Grandfather   . Breast cancer Paternal Grandmother   . Heart disease Paternal Grandfather     Social History Social History   Tobacco Use  . Smoking status: Current Every Day Smoker    Packs/day: 1.00  Years: 12.00    Pack years: 12.00    Types: Cigarettes  . Smokeless tobacco: Never Used  Substance Use Topics  . Alcohol use: Yes    Alcohol/week: 20.0 standard drinks    Types: 20 Shots of liquor per week  . Drug use: Yes    Types: Marijuana     Allergies   Penicillins   Review of Systems Review of Systems  Constitutional: Negative for appetite change, chills, diaphoresis and fever.  HENT: Positive for ear pain. Negative for congestion, ear discharge, hearing loss, postnasal drip, sinus pain, sore throat, tinnitus and trouble swallowing.   Respiratory: Negative for cough.   Skin: Negative for rash.  Allergic/Immunologic: Negative for environmental  allergies.  Neurological: Negative for dizziness.  Hematological: Negative for adenopathy.     Physical Exam Triage Vital Signs ED Triage Vitals  Enc Vitals Group     BP 09/15/19 1550 125/77     Pulse Rate 09/15/19 1550 76     Resp 09/15/19 1550 18     Temp 09/15/19 1550 98.3 F (36.8 C)     Temp Source 09/15/19 1550 Oral     SpO2 09/15/19 1550 97 %     Weight 09/15/19 1547 220 lb (99.8 kg)     Height 09/15/19 1547 5\' 3"  (1.6 m)     Head Circumference --      Peak Flow --      Pain Score 09/15/19 1547 2     Pain Loc --      Pain Edu? --      Excl. in [place]? --    No data found.  Updated Vital Signs BP 125/77 (BP Location: Left Arm)   Pulse 76   Temp 98.3 F (36.8 C) (Oral)   Resp 18   Ht 5\' 3"  (1.6 m)   Wt 220 lb (99.8 kg)   SpO2 97%   BMI 38.97 kg/m   Visual Acuity Right Eye Distance:   Left Eye Distance:   Bilateral Distance:    Right Eye Near:   Left Eye Near:    Bilateral Near:     Physical Exam Nursing note reviewed.  Constitutional:      General: She is not in acute distress.    Appearance: She is normal weight. She is not toxic-appearing.  HENT:     Right Ear: Tympanic membrane, ear canal and external ear normal.     Left Ear: Tympanic membrane, ear canal and external ear normal.     Nose: Nose normal.     Mouth/Throat:     Mouth: Mucous membranes are moist.     Pharynx: Oropharynx is clear.  Eyes:     General: No scleral icterus.    Conjunctiva/sclera: Conjunctivae normal.  Cardiovascular:     Rate and Rhythm: Normal rate.     Heart sounds: No murmur.  Pulmonary:     Effort: Pulmonary effort is normal.     Breath sounds: Normal breath sounds.  Musculoskeletal:        General: Normal range of motion.     Cervical back: Neck supple.  Skin:    General: Skin is warm and dry.  Neurological:     Mental Status: She is alert and oriented to person, place, and time.  Psychiatric:        Mood and Affect: Mood normal.        Behavior: Behavior  normal.        Thought Content: Thought content normal.  Judgment: Judgment normal.      UC Treatments / Results  Labs (all labs ordered are listed, but only abnormal results are displayed) Labs Reviewed - No data to display  EKG   Radiology No results found.  Procedures Procedures (including critical care time)  Medications Ordered in UC Medications - No data to display  Initial Impression / Assessment and Plan / UC Course  I have reviewed the triage vital signs and the nursing notes. L ear lavage ordered per pt request which was done though there was no obstruction of viewing the TM. But pt has left.  She was advised to try Zyrtec in case is mild fluids middle ear.    Final Clinical Impressions(s) / UC Diagnoses   Final diagnoses:  None   Discharge Instructions   None    ED Prescriptions    None     PDMP not reviewed this encounter.   Shelby Mattocks, [place] 09/15/19 1615

## 2019-09-15 NOTE — ED Triage Notes (Signed)
Pt c/o left ear pain. Started about 5-6 days ago. She states she can hear scratching in her ear.

## 2019-09-17 ENCOUNTER — Other Ambulatory Visit: Payer: Self-pay

## 2019-09-17 ENCOUNTER — Ambulatory Visit
Admission: RE | Admit: 2019-09-17 | Discharge: 2019-09-17 | Disposition: A | Discharge status: Home / Self Care | Payer: Medicaid Other | Source: Ambulatory Visit | Attending: Family Medicine | Admitting: Family Medicine

## 2019-09-17 DIAGNOSIS — Z452 Encounter for adjustment and management of vascular access device: Secondary | ICD-10-CM | POA: Diagnosis present

## 2019-09-17 MED ORDER — HEPARIN SOD (PORK) LOCK FLUSH 100 UNIT/ML IV SOLN
500.0000 [IU] | INTRAVENOUS | Status: AC | PRN
  Administered 2019-09-17: 500 [IU]

## 2019-09-17 MED ORDER — PENTAFLUOROPROP-TETRAFLUOROETH EX AERO
INHALATION_SPRAY | CUTANEOUS | Status: AC
  Filled 2019-09-17: qty 30

## 2019-09-25 ENCOUNTER — Other Ambulatory Visit: Payer: Self-pay | Admitting: Gastroenterology

## 2019-09-26 ENCOUNTER — Other Ambulatory Visit: Payer: Self-pay | Admitting: Gastroenterology

## 2019-10-24 ENCOUNTER — Other Ambulatory Visit: Payer: Self-pay | Admitting: Cardiovascular Disease

## 2019-10-29 ENCOUNTER — Ambulatory Visit
Admission: RE | Admit: 2019-10-29 | Discharge: 2019-10-29 | Disposition: A | Discharge status: Home / Self Care | Payer: Medicaid Other | Source: Ambulatory Visit | Attending: Family Medicine | Admitting: Family Medicine

## 2019-10-29 ENCOUNTER — Other Ambulatory Visit: Payer: Self-pay

## 2019-10-29 DIAGNOSIS — Z4689 Encounter for fitting and adjustment of other specified devices: Secondary | ICD-10-CM | POA: Insufficient documentation

## 2019-10-29 MED ORDER — HEPARIN SOD (PORK) LOCK FLUSH 100 UNIT/ML IV SOLN
500.0000 [IU] | INTRAVENOUS | Status: AC | PRN
  Administered 2019-10-29: 500 [IU]

## 2019-11-10 ENCOUNTER — Encounter: Payer: Medicaid Other | Admitting: Obstetrics and Gynecology

## 2019-12-09 ENCOUNTER — Encounter: Payer: Self-pay | Admitting: Obstetrics and Gynecology

## 2019-12-09 ENCOUNTER — Ambulatory Visit (INDEPENDENT_AMBULATORY_CARE_PROVIDER_SITE_OTHER): Payer: Medicaid Other | Admitting: Obstetrics and Gynecology

## 2019-12-09 ENCOUNTER — Other Ambulatory Visit: Payer: Self-pay

## 2019-12-09 VITALS — BP 122/74 | Ht 63.0 in | Wt 215.0 lb

## 2019-12-09 DIAGNOSIS — T8339XA Other mechanical complication of intrauterine contraceptive device, initial encounter: Secondary | ICD-10-CM | POA: Diagnosis not present

## 2019-12-09 NOTE — Progress Notes (Signed)
Obstetrics & Gynecology Office Visit   Chief Complaint  Patient presents with  . Contraception    History of Present Illness: 34 y.o. G26P1011 female who presents to discuss contraception. She has a paragard that has been present for 10 years. She presents to have another one placed.  In the past few months her period has been irregular.     Past Medical History:  Diagnosis Date  . Anxiety   . Biliary calculi 12/07/2009  . Cholelithiasis   . Depression   . GERD (gastroesophageal reflux disease)   . Hepatitis C    body "self healed" per patient  . Substance abuse [facility])     Past Surgical History:  Procedure Laterality Date  . CHOLECYSTECTOMY     2011  . ESOPHAGOGASTRODUODENOSCOPY (EGD) WITH PROPOFOL N/A 01/21/2018   Procedure: ESOPHAGOGASTRODUODENOSCOPY (EGD) WITH Biopsy;  Surgeon: Lucilla Lame, MD;  Location: [place];  Service: Endoscopy;  Laterality: N/A;  . PORTA CATH INSERTION N/A 01/01/2019   Procedure: PORTA CATH INSERTION;  Surgeon: Algernon Huxley, MD;  Location: [place] [facility];  Service: Cardiovascular;  Laterality: N/A;    Gynecologic History: No LMP recorded. (Menstrual status: IUD).  Family History  Problem Relation Age of Onset  . Hypertension Mother   . Diabetes Father   . Hypertension Father   . Liver cancer Maternal Grandmother   . Hypertension Maternal Grandfather   . Breast cancer Paternal Grandmother   . Heart disease Paternal Grandfather     Social History   Socioeconomic History  . Marital status: Single    Spouse name: Not on file  . Number of children: Not on file  . Years of education: Not on file  . Highest education level: Not on file  Occupational History  . Not on file  Tobacco Use  . Smoking status: Current Every Day Smoker    Packs/day: 1.00    Years: 12.00    Pack years: 12.00    Types: Cigarettes  . Smokeless tobacco: Never Used  Vaping Use  . Vaping Use: Never used  Substance and Sexual Activity  . Alcohol  use: Yes    Alcohol/week: 20.0 standard drinks    Types: 20 Shots of liquor per week  . Drug use: Yes    Types: Marijuana  . Sexual activity: Yes    Birth control/protection: Implant, I.U.D.  Other Topics Concern  . Not on file  Social History Narrative  . Not on file   Social Determinants of Health   Financial Resource Strain:   . Difficulty of Paying Living Expenses: Not on file  Food Insecurity:   . Worried About Charity fundraiser in the Last Year: Not on file  . Ran Out of Food in the Last Year: Not on file  Transportation Needs:   . Lack of Transportation (Medical): Not on file  . Lack of Transportation (Non-Medical): Not on file  Physical Activity:   . Days of Exercise per Week: Not on file  . Minutes of Exercise per Session: Not on file  Stress:   . Feeling of Stress : Not on file  Social Connections:   . Frequency of Communication with Friends and Family: Not on file  . Frequency of Social Gatherings with Friends and Family: Not on file  . Attends Religious Services: Not on file  . Active Member of Clubs or Organizations: Not on file  . Attends Archivist Meetings: Not on file  . Marital Status: Not on  file  Intimate Partner Violence:   . Fear of Current or Ex-Partner: Not on file  . Emotionally Abused: Not on file  . Physically Abused: Not on file  . Sexually Abused: Not on file    Allergies  Allergen Reactions  . Penicillins Swelling    Prior to Admission medications   Medication Sig Start Date End Date Taking? Authorizing Provider  Biotin w/ Vitamins C & E (HAIR/SKIN/NAILS PO) Take by mouth.    [provider]  citalopram (CELEXA) 20 MG tablet Take 1.5 tablets (30 mg total) by mouth daily. 07/15/19   Johnson, Megan P, DO  cyclobenzaprine (FLEXERIL) 10 MG tablet Take 1 tablet (10 mg total) by mouth at bedtime. Patient taking differently: Take 10 mg by mouth daily as needed.  07/15/18   Johnson, Megan P, DO  ibuprofen (ADVIL,MOTRIN) 800 MG  tablet TAKE 1 TABLET(800 MG) BY MOUTH EVERY 8 HOURS AS NEEDED 07/15/18   Johnson, Megan P, DO  metoprolol tartrate (LOPRESSOR) 25 MG tablet TAKE 1 TABLET(25 MG) BY MOUTH TWICE DAILY AS NEEDED 10/27/19   Minna Merritts, MD  pantoprazole (PROTONIX) 40 MG tablet Take 1 tablet (40 mg total) by mouth daily. **PLEASE SCHEDULE FOLLOW UP APPT FOR ADDITIONAL REFILLS** 09/26/19   Lucilla Lame, MD  [facility] INTRAUTERINE COPPER IU by Intrauterine route.    [provider]  SUBOXONE 8-2 MG FILM DISSOLVE 1 FILM UNT D X 7 DAYS 12/13/17   [provider]    Review of Systems  Constitutional: Negative.   HENT: Negative.   Eyes: Negative.   Respiratory: Negative.   Cardiovascular: Negative.   Gastrointestinal: Negative.   Genitourinary: Negative.   Musculoskeletal: Negative.   Skin: Negative.   Neurological: Negative.   Psychiatric/Behavioral: Negative.     Physical Exam BP 122/74   Ht 5\' 3"  (1.6 m)   Wt 215 lb (97.5 kg)   BMI 38.09 kg/m  No LMP recorded. (Menstrual status: IUD). Physical Exam Constitutional:      General: She is not in acute distress.    Appearance: Normal appearance.  Genitourinary:     Pelvic exam was performed with patient in the lithotomy position.     Vulva, inguinal canal, urethra, bladder, vagina, cervix and uterus normal.     No IUD strings visualized.     Uterus is anteverted.  HENT:     Head: Normocephalic and atraumatic.  Eyes:     General: No scleral icterus.    Conjunctiva/sclera: Conjunctivae normal.  Neurological:     General: No focal deficit present.     Mental Status: She is alert and oriented to person, place, and time.     Cranial Nerves: No cranial nerve deficit.  Psychiatric:        Mood and Affect: Mood normal.        Behavior: Behavior normal.        Judgment: Judgment normal.    IUD Removal  Patient identified, informed consent performed, consent signed.  Patient was in the dorsal lithotomy position, normal external genitalia  was noted.  A speculum was placed in the patient's vagina, normal discharge was noted, no lesions. The cervix was visualized, no lesions, no abnormal discharge. The strings of the IUD were not visualized, so multiple passes were made with the IUD "hook" and [place] forceps without success. I even attempted to remove the IUD under ultrasound guidance without success. Patient tolerated the procedure well.    Female chaperone present for pelvic and breast  portions  of the physical exam  Assessment: 34 y.o. G49P1011 female here for  1. Other mechanical complication of intrauterine contraceptive device, initial encounter      Plan: Problem List Items Addressed This Visit    None    Visit Diagnoses    Other mechanical complication of intrauterine contraceptive device, initial encounter    -  Primary       ICD-10-CM   1. Other mechanical complication of intrauterine contraceptive device, initial encounter  T83.39XA     Due to being unable to retrieve the IUD, will attempt in the OR with hysteroscopy, D&C, IUD removal and replacement.    Prentice Docker, MD 12/09/2019 3:49 PM

## 2019-12-10 ENCOUNTER — Ambulatory Visit
Admission: RE | Admit: 2019-12-10 | Discharge: 2019-12-10 | Disposition: A | Discharge status: Home / Self Care | Payer: Medicaid Other | Source: Ambulatory Visit | Attending: Family Medicine | Admitting: Family Medicine

## 2019-12-10 ENCOUNTER — Other Ambulatory Visit: Payer: Self-pay

## 2019-12-10 DIAGNOSIS — R5383 Other fatigue: Secondary | ICD-10-CM | POA: Diagnosis not present

## 2019-12-10 DIAGNOSIS — Z13220 Encounter for screening for lipoid disorders: Secondary | ICD-10-CM | POA: Diagnosis not present

## 2019-12-10 DIAGNOSIS — E669 Obesity, unspecified: Secondary | ICD-10-CM | POA: Insufficient documentation

## 2019-12-10 LAB — CBC WITH DIFFERENTIAL/PLATELET
Abs Immature Granulocytes: 0.02 10*3/uL (ref 0.00–0.07)
Basophils Absolute: 0.1 10*3/uL (ref 0.0–0.1)
Basophils Relative: 1 %
Eosinophils Absolute: 0.1 10*3/uL (ref 0.0–0.5)
Eosinophils Relative: 2 %
HCT: 37.4 % (ref 36.0–46.0)
Hemoglobin: 13.1 g/dL (ref 12.0–15.0)
Immature Granulocytes: 0 %
Lymphocytes Relative: 27 %
Lymphs Abs: 2 10*3/uL (ref 0.7–4.0)
MCH: 33.2 pg (ref 26.0–34.0)
MCHC: 35 g/dL (ref 30.0–36.0)
MCV: 94.7 fL (ref 80.0–100.0)
Monocytes Absolute: 0.5 10*3/uL (ref 0.1–1.0)
Monocytes Relative: 6 %
Neutro Abs: 4.8 10*3/uL (ref 1.7–7.7)
Neutrophils Relative %: 64 %
Platelets: 196 10*3/uL (ref 150–400)
RBC: 3.95 MIL/uL (ref 3.87–5.11)
RDW: 13.8 % (ref 11.5–15.5)
WBC: 7.5 10*3/uL (ref 4.0–10.5)
nRBC: 0 % (ref 0.0–0.2)

## 2019-12-10 LAB — COMPREHENSIVE METABOLIC PANEL
ALT: 31 U/L (ref 0–44)
AST: 38 U/L (ref 15–41)
Albumin: 3.5 g/dL (ref 3.5–5.0)
Alkaline Phosphatase: 65 U/L (ref 38–126)
Anion gap: 11 (ref 5–15)
BUN: 8 mg/dL (ref 6–20)
CO2: 31 mmol/L (ref 22–32)
Calcium: 8.4 mg/dL — ABNORMAL LOW (ref 8.9–10.3)
Chloride: 97 mmol/L — ABNORMAL LOW (ref 98–111)
Creatinine, Ser: 0.61 mg/dL (ref 0.44–1.00)
GFR calc Af Amer: >60 mL/min (ref >60)
GFR calc non Af Amer: >60 mL/min (ref >60)
Glucose, Bld: 97 mg/dL (ref 70–99)
Potassium: 3 mmol/L — ABNORMAL LOW (ref 3.5–5.1)
Sodium: 139 mmol/L (ref 135–145)
Total Bilirubin: 0.7 mg/dL (ref 0.3–1.2)
Total Protein: 6.3 g/dL — ABNORMAL LOW (ref 6.5–8.1)

## 2019-12-10 LAB — LIPID PANEL
Cholesterol: 202 mg/dL — ABNORMAL HIGH (ref 0–200)
HDL: 41 mg/dL (ref >40)
LDL Cholesterol: 110 mg/dL — ABNORMAL HIGH (ref 0–99)
Total CHOL/HDL Ratio: 4.9 RATIO
Triglycerides: 256 mg/dL — ABNORMAL HIGH (ref <150)
VLDL: 51 mg/dL — ABNORMAL HIGH (ref 0–40)

## 2019-12-10 LAB — HEMOGLOBIN A1C
Hgb A1c MFr Bld: 5.8 % — ABNORMAL HIGH (ref 4.8–5.6)
Mean Plasma Glucose: 119.76 mg/dL

## 2019-12-10 MED ORDER — HEPARIN SOD (PORK) LOCK FLUSH 100 UNIT/ML IV SOLN
INTRAVENOUS | Status: AC
  Administered 2019-12-10: 500 [IU]
  Filled 2019-12-10: qty 5

## 2019-12-10 MED ORDER — HEPARIN SOD (PORK) LOCK FLUSH 100 UNIT/ML IV SOLN: 500.0000 [IU] | INTRAVENOUS | Status: AC | PRN

## 2019-12-10 NOTE — Progress Notes (Signed)
Labs drawn while port accessed

## 2019-12-24 ENCOUNTER — Telehealth: Payer: Self-pay | Admitting: Obstetrics and Gynecology

## 2019-12-24 NOTE — Telephone Encounter (Signed)
VM full, will try again

## 2019-12-31 ENCOUNTER — Telehealth: Payer: Self-pay | Admitting: Obstetrics and Gynecology

## 2019-12-31 NOTE — Telephone Encounter (Signed)
-----   Message from Will Bonnet, MD sent at 12/09/2019  3:49 PM EDT ----- Regarding: Schedule surgery Surgery Booking Request Patient Full Name:  Katie Stanley  MRN: [id number]  DOB: [birth year]  Surgeon: Prentice Docker, MD  Requested Surgery Date and Time: TBD Primary Diagnosis AND Code:  mechanical complication of IUD [M87.37BC]  Secondary Diagnosis and Code:  Surgical Procedure: Hysteroscopy, dilation and curettage, IUD removal, replacement of Paragard (copper) IUD RNFA Requested?: No L&D Notification: No Admission Status: same day surgery Length of Surgery: 50 min Special Case Needs: yes. Paragard IUD H&P: Yes Phone Interview???:  Yes Interpreter: No Medical Clearance:  No Special Scheduling Instructions: No Any known health/anesthesia issues, diabetes, sleep apnea, latex allergy, defibrillator/pacemaker?: No Acuity: P2   (P1 highest, P2 delay may cause harm, P3 low, elective gyn, P4 lowest)

## 2019-12-31 NOTE — Telephone Encounter (Signed)
Patient called in response to the [place] I sent to schedule Hysteroscopy D&C, IUD removal, replacement of Paragard (copper) IUD w Glennon Mac  DOS 10/12  H&P 10/4 @ 4:30   Covid testing 10/8 @ 8-10:30, [facility] [employer], drive up and wear mask. Advised pt to quarantine until DOS.  Pre-admit phone call appointment to be requested - date and time will be included on H&P paper work. Also all appointments will be updated on pt MyChart. Explained that this appointment has a call window. Based on the time scheduled will indicate if the call will be received within a 4 hour window before 1:00 or after.  Advised that pt may also receive calls from the hospital pharmacy and pre-service center.  Confirmed pt has [place] Medicaid as primary insurance. No secondary insurance.

## 2020-01-12 ENCOUNTER — Ambulatory Visit (INDEPENDENT_AMBULATORY_CARE_PROVIDER_SITE_OTHER): Payer: Medicaid Other | Admitting: Obstetrics and Gynecology

## 2020-01-12 ENCOUNTER — Encounter: Payer: Self-pay | Admitting: Obstetrics and Gynecology

## 2020-01-12 ENCOUNTER — Other Ambulatory Visit: Payer: Self-pay

## 2020-01-12 VITALS — BP 118/78 | Ht 63.0 in | Wt 212.0 lb

## 2020-01-12 DIAGNOSIS — T8339XA Other mechanical complication of intrauterine contraceptive device, initial encounter: Secondary | ICD-10-CM

## 2020-01-12 NOTE — H&P (View-Only) (Signed)
Preoperative History and Physical  Katie Stanley is a 34 y.o. G70P1011 female here for surgical management of intrauterine device that has been in place for 10 years and needs replacement.   No significant preoperative concerns.  History of Present Illness: 34 y.o. G2P1011 female who has had a paragard for 10 years. An attempt at removal in clinic in August was unsuccessful.  She would like the device removed and replaced.   Proposed surgery: Hysteroscopy, dilation and curettage, removal of intrauterine device and replacement of intrauterine device with Mirena.   Past Medical History:  Diagnosis Date  . Anxiety   . Biliary calculi 12/07/2009  . Cholelithiasis   . Depression   . GERD (gastroesophageal reflux disease)   . Hepatitis C    body "self healed" per patient  . [facility]    Past Surgical History:  Procedure Laterality Date  . CHOLECYSTECTOMY     2011  . ESOPHAGOGASTRODUODENOSCOPY (EGD) WITH PROPOFOL N/A 01/21/2018   Procedure: ESOPHAGOGASTRODUODENOSCOPY (EGD) WITH Biopsy;  Surgeon: Lucilla Lame, MD;  Location: [place];  Service: Endoscopy;  Laterality: N/A;  . PORTA CATH INSERTION N/A 01/01/2019   Procedure: PORTA CATH INSERTION;  Surgeon: Algernon Huxley, MD;  Location: [place] [facility];  Service: Cardiovascular;  Laterality: N/A;     Current Outpatient Medications on File Prior to Visit  Medication Sig Dispense Refill  . citalopram (CELEXA) 20 MG tablet Take 1.5 tablets (30 mg total) by mouth daily. 135 tablet 1  . ibuprofen (ADVIL) 200 MG tablet Take 600 mg by mouth every 6 (six) hours as needed for moderate pain.    . metoprolol tartrate (LOPRESSOR) 25 MG tablet TAKE 1 TABLET(25 MG) BY MOUTH TWICE DAILY AS NEEDED (Patient taking differently: Take 25 mg by mouth daily. ) 60 tablet 2  . Naphazoline HCl (CLEAR EYES OP) Place 1 drop into both eyes daily as needed (itching).    . pantoprazole (PROTONIX) 40 MG tablet Take 1 tablet (40 mg total) by  mouth daily. **PLEASE SCHEDULE FOLLOW UP APPT FOR ADDITIONAL REFILLS** 90 tablet 0  . PARAGARD INTRAUTERINE COPPER IU by Intrauterine route.    . SUBOXONE 8-2 MG FILM Place 1 Film under the tongue 2 (two) times daily.   0   No current facility-administered medications on file prior to visit.   Allergies  Allergen Reactions  . Penicillins Swelling    Social History:   reports that she has been smoking cigarettes. She has a 12.00 pack-year smoking history. She has never used smokeless tobacco. She reports current alcohol use of about 20.0 standard drinks of alcohol per week. She reports current drug use. Drug: Marijuana.  Family History  Problem Relation Age of Onset  . Hypertension Mother   . Diabetes Father   . Hypertension Father   . Liver cancer Maternal Grandmother   . Hypertension Maternal Grandfather   . Breast cancer Paternal Grandmother   . Heart disease Paternal Grandfather     Review of Systems: Noncontributory  PHYSICAL EXAM: Blood pressure 118/78, height 5\' 3"  (1.6 m), weight 212 lb (96.2 kg). CONSTITUTIONAL: Well-developed, well-nourished female in no acute distress.  HENT:  Normocephalic, atraumatic, External right and left ear normal. Oropharynx is clear and moist EYES: Conjunctivae and EOM are normal. Pupils are equal, round, and reactive to light. No scleral icterus.  NECK: Normal range of motion, supple, no masses SKIN: Skin is warm and dry. No rash noted. Not diaphoretic. No erythema. No pallor. [place]: Alert and  oriented to person, place, and time. Normal reflexes, muscle tone coordination. No cranial nerve deficit noted. PSYCHIATRIC: Normal mood and affect. Normal behavior. Normal judgment and thought content. CARDIOVASCULAR: Normal heart rate noted, regular rhythm RESPIRATORY: Effort and breath sounds normal, no problems with respiration noted ABDOMEN: Soft, nontender, nondistended. PELVIC: Deferred MUSCULOSKELETAL: Normal range of motion. No edema and  no tenderness. 2+ distal pulses.  Labs: No results found for this or any previous visit (from the past 336 hour(s)).  Imaging Studies: No results found.  Assessment:   ICD-10-CM   1. Other mechanical complication of intrauterine contraceptive device, initial encounter  T83.39XA      Plan: Patient will undergo surgical management with the above-noted surgery.   The risks of surgery were discussed in detail with the patient including but not limited to: bleeding which may require transfusion or reoperation; infection which may require antibiotics; injury to surrounding organs which may involve bowel, bladder, ureters ; need for additional procedures including laparoscopy or laparotomy; thromboembolic phenomenon, surgical site problems and other postoperative/anesthesia complications. Likelihood of success in alleviating the patient's condition was discussed. Routine postoperative instructions will be reviewed with the patient and her family in detail after surgery.  The patient concurred with the proposed plan, giving informed written consent for the surgery.  Preoperative prophylactic antibiotics, as indicated, and SCDs ordered on call to the OR.    Prentice Docker, MD 01/12/2020 4:56 PM

## 2020-01-12 NOTE — Progress Notes (Signed)
Preoperative History and Physical  Katie Stanley is a 34 y.o. G65P1011 female here for surgical management of intrauterine device that has been in place for 10 years and needs replacement.   No significant preoperative concerns.  History of Present Illness: 34 y.o. G50P1011 female who has had a paragard for 10 years. An attempt at removal in clinic in August was unsuccessful.  She would like the device removed and replaced.   Proposed surgery: Hysteroscopy, dilation and curettage, removal of intrauterine device and replacement of intrauterine device with Mirena.   Past Medical History:  Diagnosis Date  . Anxiety   . Biliary calculi 12/07/2009  . Cholelithiasis   . Depression   . GERD (gastroesophageal reflux disease)   . Hepatitis C    body "self healed" per patient  . Substance abuse [facility])    Past Surgical History:  Procedure Laterality Date  . CHOLECYSTECTOMY     2011  . ESOPHAGOGASTRODUODENOSCOPY (EGD) WITH PROPOFOL N/A 01/21/2018   Procedure: ESOPHAGOGASTRODUODENOSCOPY (EGD) WITH Biopsy;  Surgeon: Lucilla Lame, MD;  Location: [place];  Service: Endoscopy;  Laterality: N/A;  . PORTA CATH INSERTION N/A 01/01/2019   Procedure: PORTA CATH INSERTION;  Surgeon: Algernon Huxley, MD;  Location: [place] [facility];  Service: Cardiovascular;  Laterality: N/A;     Current Outpatient Medications on File Prior to Visit  Medication Sig Dispense Refill  . citalopram (CELEXA) 20 MG tablet Take 1.5 tablets (30 mg total) by mouth daily. 135 tablet 1  . ibuprofen (ADVIL) 200 MG tablet Take 600 mg by mouth every 6 (six) hours as needed for moderate pain.    . metoprolol tartrate (LOPRESSOR) 25 MG tablet TAKE 1 TABLET(25 MG) BY MOUTH TWICE DAILY AS NEEDED (Patient taking differently: Take 25 mg by mouth daily. ) 60 tablet 2  . Naphazoline HCl (CLEAR EYES OP) Place 1 drop into both eyes daily as needed (itching).    . pantoprazole (PROTONIX) 40 MG tablet Take 1 tablet (40 mg total) by  mouth daily. **PLEASE SCHEDULE FOLLOW UP APPT FOR ADDITIONAL REFILLS** 90 tablet 0  . PARAGARD INTRAUTERINE COPPER IU by Intrauterine route.    . SUBOXONE 8-2 MG FILM Place 1 Film under the tongue 2 (two) times daily.   0   No current facility-administered medications on file prior to visit.   Allergies  Allergen Reactions  . Penicillins Swelling    Social History:   reports that she has been smoking cigarettes. She has a 12.00 pack-year smoking history. She has never used smokeless tobacco. She reports current alcohol use of about 20.0 standard drinks of alcohol per week. She reports current drug use. Drug: Marijuana.  Family History  Problem Relation Age of Onset  . Hypertension Mother   . Diabetes Father   . Hypertension Father   . Liver cancer Maternal Grandmother   . Hypertension Maternal Grandfather   . Breast cancer Paternal Grandmother   . Heart disease Paternal Grandfather     Review of Systems: Noncontributory  PHYSICAL EXAM: Blood pressure 118/78, height 5\' 3"  (1.6 m), weight 212 lb (96.2 kg). CONSTITUTIONAL: Well-developed, well-nourished female in no acute distress.  HENT:  Normocephalic, atraumatic, External right and left ear normal. Oropharynx is clear and moist EYES: Conjunctivae and EOM are normal. Pupils are equal, round, and reactive to light. No scleral icterus.  NECK: Normal range of motion, supple, no masses SKIN: Skin is warm and dry. No rash noted. Not diaphoretic. No erythema. No pallor. [place]: Alert and  oriented to person, place, and time. Normal reflexes, muscle tone coordination. No cranial nerve deficit noted. PSYCHIATRIC: Normal mood and affect. Normal behavior. Normal judgment and thought content. CARDIOVASCULAR: Normal heart rate noted, regular rhythm RESPIRATORY: Effort and breath sounds normal, no problems with respiration noted ABDOMEN: Soft, nontender, nondistended. PELVIC: Deferred MUSCULOSKELETAL: Normal range of motion. No edema and  no tenderness. 2+ distal pulses.  Labs: No results found for this or any previous visit (from the past 336 hour(s)).  Imaging Studies: No results found.  Assessment:   ICD-10-CM   1. Other mechanical complication of intrauterine contraceptive device, initial encounter  T83.39XA      Plan: Patient will undergo surgical management with the above-noted surgery.   The risks of surgery were discussed in detail with the patient including but not limited to: bleeding which may require transfusion or reoperation; infection which may require antibiotics; injury to surrounding organs which may involve bowel, bladder, ureters ; need for additional procedures including laparoscopy or laparotomy; thromboembolic phenomenon, surgical site problems and other postoperative/anesthesia complications. Likelihood of success in alleviating the patient's condition was discussed. Routine postoperative instructions will be reviewed with the patient and her family in detail after surgery.  The patient concurred with the proposed plan, giving informed written consent for the surgery.  Preoperative prophylactic antibiotics, as indicated, and SCDs ordered on call to the OR.    Prentice Docker, MD 01/12/2020 4:56 PM

## 2020-01-14 ENCOUNTER — Encounter
Admission: RE | Admit: 2020-01-14 | Discharge: 2020-01-14 | Disposition: A | Discharge status: Home / Self Care | Payer: Medicaid Other | Source: Ambulatory Visit | Attending: Obstetrics and Gynecology | Admitting: Obstetrics and Gynecology

## 2020-01-14 ENCOUNTER — Other Ambulatory Visit: Payer: Self-pay

## 2020-01-14 HISTORY — DX: Palpitations: R00.2

## 2020-01-14 NOTE — Patient Instructions (Signed)
Your procedure is scheduled on: Tuesday Jan 20, 2020 Report to Day Surgery on the 2nd floor of the [facility]. To find out your arrival time, please call [phone] between 1PM - 3PM on: Monday Jan 19, 2020  REMEMBER: Instructions that are not followed completely may result in serious medical risk, up to and including death; or upon the discretion of your surgeon and anesthesiologist your surgery may need to be rescheduled.  Do not eat food after midnight the night before surgery.  No gum chewing, lozengers or hard candies.  You may however, drink CLEAR liquids up to 2 hours before you are scheduled to arrive for your surgery. Do not drink anything within 2 hours of your scheduled arrival time.  Clear liquids include: - water  - apple juice without pulp - gatorade (not RED) - black coffee or tea (Do NOT add milk or creamers to the coffee or tea) Do NOT drink anything that is not on this list.  Type 1 and Type 2 diabetics should only drink water.  In addition, your doctor has ordered for you to drink the provided  Ensure Pre-Surgery Clear Carbohydrate Drink  Drinking this carbohydrate drink up to two hours before surgery helps to reduce insulin resistance and improve patient outcomes. Please complete drinking 2 hours prior to scheduled arrival time.  TAKE THESE MEDICATIONS THE MORNING OF SURGERY WITH A SIP OF WATER: CITALOPRAM METOPROLOL PANTOPRAZOLE  (take one the night before and one on the morning of surgery - helps to prevent nausea after surgery.)  One week prior to surgery: Stop Anti-inflammatories (NSAIDS) such as Advil, Aleve, Ibuprofen, Motrin, Naproxen, Naprosyn and ASPIRIN OR Aspirin based products such as Excedrin, Goodys Powder, BC Powder. Stop ANY OVER THE COUNTER supplements until after surgery. (You may continue taking Tylenol, Vitamin D, Vitamin B, and multivitamin.)  No Alcohol for 24 hours before or after surgery.  No Smoking including e-cigarettes for 24  hours prior to surgery.  No chewable tobacco products for at least 6 hours prior to surgery.  No nicotine patches on the day of surgery.  Do not use any "recreational" drugs for at least a week prior to your surgery.  Please be advised that the combination of cocaine and anesthesia may have negative outcomes, up to and including death. If you test positive for cocaine, your surgery will be cancelled.  On the morning of surgery brush your teeth with toothpaste and water, you may rinse your mouth with mouthwash if you wish. Do not swallow any toothpaste or mouthwash.  Do not wear jewelry, make-up, hairpins, clips or nail polish.  Do not wear lotions, powders, or perfumes.   Do not shave 48 hours prior to surgery.   Contact lenses, hearing aids and dentures may not be worn into surgery.  Do not bring valuables to the hospital. [facility] is not responsible for any missing/lost belongings or valuables.   SHOWER MORNING OF SURGERY   Notify your doctor if there is any change in your medical condition (cold, fever, infection).  Wear comfortable clothing (specific to your surgery type) to the hospital.  Plan for stool softeners for home use; pain medications have a tendency to cause constipation. You can also help prevent constipation by eating foods high in fiber such as fruits and vegetables and drinking plenty of fluids as your diet allows.  After surgery, you can help prevent lung complications by doing breathing exercises.  Take deep breaths and cough every 1-2 hours. Your doctor may order  a device called an Incentive Spirometer to help you take deep breaths. When coughing or sneezing, hold a pillow firmly against your incision with both hands. This is called "splinting." Doing this helps protect your incision. It also decreases belly discomfort.  If you are being discharged the day of surgery, you will not be allowed to drive home. You will need a responsible adult (18 years or  older) to drive you home and stay with you that night.   If you are taking public transportation, you will need to have a responsible adult (18 years or older) with you. Please confirm with your physician that it is acceptable to use public transportation.   Please call the [facility] at [phone] if you have any questions about these instructions.  Visitation Policy:  Patients undergoing a surgery or procedure may have one family member or support person with them as long as that person is not COVID-19 positive or experiencing its symptoms.  That person may remain in the waiting area during the procedure.  Inpatient Visitation Update:   In an effort to ensure the safety of our team members and our patients, we are implementing a change to our visitation policy:  Effective Monday, Aug. 9, at 7 a.m., inpatients will be allowed one support person.  o The support person may change daily.  o The support person must pass our screening, gel in and out, and wear a mask at all times, including in the patient's room.  o Patients must also wear a mask when staff or their support person are in the room.  o Masking is required regardless of vaccination status.  Systemwide, no visitors [address] or [place].

## 2020-01-16 ENCOUNTER — Other Ambulatory Visit: Payer: Self-pay

## 2020-01-16 ENCOUNTER — Other Ambulatory Visit
Admission: RE | Admit: 2020-01-16 | Discharge: 2020-01-16 | Disposition: A | Discharge status: Home / Self Care | Payer: Medicaid Other | Source: Ambulatory Visit | Attending: Obstetrics and Gynecology | Admitting: Obstetrics and Gynecology

## 2020-01-16 DIAGNOSIS — Z20822 Contact with and (suspected) exposure to covid-19: Secondary | ICD-10-CM | POA: Insufficient documentation

## 2020-01-16 DIAGNOSIS — Z01812 Encounter for preprocedural laboratory examination: Secondary | ICD-10-CM | POA: Insufficient documentation

## 2020-01-16 LAB — SARS CORONAVIRUS 2 (TAT 6-24 HRS): SARS Coronavirus 2: NEGATIVE

## 2020-01-17 ENCOUNTER — Other Ambulatory Visit: Payer: Self-pay | Admitting: Gastroenterology

## 2020-01-19 ENCOUNTER — Other Ambulatory Visit: Payer: Self-pay | Admitting: Gastroenterology

## 2020-01-20 ENCOUNTER — Ambulatory Visit
Admission: RE | Admit: 2020-01-20 | Discharge: 2020-01-20 | Disposition: A | Discharge status: Home / Self Care | Payer: Medicaid Other | Attending: Obstetrics and Gynecology | Admitting: Obstetrics and Gynecology

## 2020-01-20 ENCOUNTER — Other Ambulatory Visit: Payer: Self-pay

## 2020-01-20 ENCOUNTER — Ambulatory Visit: Payer: Medicaid Other | Admitting: Certified Registered Nurse Anesthetist

## 2020-01-20 ENCOUNTER — Ambulatory Visit: Payer: Medicaid Other | Admitting: Urgent Care

## 2020-01-20 ENCOUNTER — Encounter: Payer: Self-pay | Admitting: Obstetrics and Gynecology

## 2020-01-20 ENCOUNTER — Encounter
Admission: RE | Disposition: A | Discharge status: Home / Self Care | Payer: Self-pay | Source: Home / Self Care | Attending: Obstetrics and Gynecology

## 2020-01-20 DIAGNOSIS — Z30433 Encounter for removal and reinsertion of intrauterine contraceptive device: Secondary | ICD-10-CM | POA: Diagnosis not present

## 2020-01-20 DIAGNOSIS — Y831 Surgical operation with implant of artificial internal device as the cause of abnormal reaction of the patient, or of later complication, without mention of misadventure at the time of the procedure: Secondary | ICD-10-CM | POA: Diagnosis not present

## 2020-01-20 DIAGNOSIS — T8339XA Other mechanical complication of intrauterine contraceptive device, initial encounter: Secondary | ICD-10-CM

## 2020-01-20 DIAGNOSIS — Z9049 Acquired absence of other specified parts of digestive tract: Secondary | ICD-10-CM | POA: Diagnosis not present

## 2020-01-20 DIAGNOSIS — F419 Anxiety disorder, unspecified: Secondary | ICD-10-CM | POA: Diagnosis not present

## 2020-01-20 DIAGNOSIS — K219 Gastro-esophageal reflux disease without esophagitis: Secondary | ICD-10-CM | POA: Insufficient documentation

## 2020-01-20 DIAGNOSIS — Z880 Allergy status to penicillin: Secondary | ICD-10-CM | POA: Diagnosis not present

## 2020-01-20 DIAGNOSIS — Z79899 Other long term (current) drug therapy: Secondary | ICD-10-CM | POA: Diagnosis not present

## 2020-01-20 DIAGNOSIS — F17200 Nicotine dependence, unspecified, uncomplicated: Secondary | ICD-10-CM | POA: Diagnosis not present

## 2020-01-20 DIAGNOSIS — F32A Depression, unspecified: Secondary | ICD-10-CM | POA: Insufficient documentation

## 2020-01-20 DIAGNOSIS — B1920 Unspecified viral hepatitis C without hepatic coma: Secondary | ICD-10-CM | POA: Diagnosis not present

## 2020-01-20 HISTORY — DX: Other mechanical complication of intrauterine contraceptive device, initial encounter: T83.39XA

## 2020-01-20 HISTORY — PX: HYSTEROSCOPY WITH D & C: SHX1775

## 2020-01-20 HISTORY — PX: INTRAUTERINE DEVICE (IUD) INSERTION: SHX5877

## 2020-01-20 HISTORY — PX: IUD REMOVAL: SHX5392

## 2020-01-20 LAB — BASIC METABOLIC PANEL
Anion gap: 10 (ref 5–15)
BUN: 7 mg/dL (ref 6–20)
CO2: 34 mmol/L — ABNORMAL HIGH (ref 22–32)
Calcium: 8.9 mg/dL (ref 8.9–10.3)
Chloride: 95 mmol/L — ABNORMAL LOW (ref 98–111)
Creatinine, Ser: 0.5 mg/dL (ref 0.44–1.00)
GFR, Estimated: >60 mL/min (ref >60)
Glucose, Bld: 97 mg/dL (ref 70–99)
Potassium: 2.8 mmol/L — ABNORMAL LOW (ref 3.5–5.1)
Sodium: 139 mmol/L (ref 135–145)

## 2020-01-20 LAB — POCT PREGNANCY, URINE: Preg Test, Ur: NEGATIVE

## 2020-01-20 SURGERY — DILATATION AND CURETTAGE /HYSTEROSCOPY
Anesthesia: General

## 2020-01-20 MED ORDER — METOPROLOL TARTRATE 25 MG PO TABS: 25.0000 mg | ORAL_TABLET | Freq: Every day | ORAL | Status: DC

## 2020-01-20 MED ORDER — HEPARIN SOD (PORK) LOCK FLUSH 100 UNIT/ML IV SOLN
INTRAVENOUS | Status: AC
  Filled 2020-01-20: qty 5

## 2020-01-20 MED ORDER — MIDAZOLAM HCL 2 MG/2ML IJ SOLN
INTRAMUSCULAR | Status: AC
  Filled 2020-01-20: qty 2

## 2020-01-20 MED ORDER — DEXAMETHASONE SODIUM PHOSPHATE 10 MG/ML IJ SOLN
INTRAMUSCULAR | Status: AC
  Filled 2020-01-20: qty 1

## 2020-01-20 MED ORDER — LIDOCAINE HCL (PF) 2 % IJ SOLN
INTRAMUSCULAR | Status: AC
  Filled 2020-01-20: qty 5

## 2020-01-20 MED ORDER — DEXMEDETOMIDINE (PRECEDEX) IN NS 20 MCG/5ML (4 MCG/ML) IV SYRINGE
PREFILLED_SYRINGE | INTRAVENOUS | Status: AC
  Filled 2020-01-20: qty 5

## 2020-01-20 MED ORDER — FENTANYL CITRATE (PF) 100 MCG/2ML IJ SOLN
INTRAMUSCULAR | Status: AC
  Filled 2020-01-20: qty 2

## 2020-01-20 MED ORDER — KETOROLAC TROMETHAMINE 30 MG/ML IJ SOLN
INTRAMUSCULAR | Status: DC | PRN
  Administered 2020-01-20: 30 mg via INTRAVENOUS

## 2020-01-20 MED ORDER — DEXMEDETOMIDINE (PRECEDEX) IN NS 20 MCG/5ML (4 MCG/ML) IV SYRINGE
PREFILLED_SYRINGE | INTRAVENOUS | Status: DC | PRN
  Administered 2020-01-20 (×2): 8 ug via INTRAVENOUS
  Administered 2020-01-20: 4 ug via INTRAVENOUS

## 2020-01-20 MED ORDER — KETOROLAC TROMETHAMINE 30 MG/ML IJ SOLN
INTRAMUSCULAR | Status: AC
  Filled 2020-01-20: qty 1

## 2020-01-20 MED ORDER — DIPHENHYDRAMINE HCL 50 MG/ML IJ SOLN
INTRAMUSCULAR | Status: AC
  Filled 2020-01-20: qty 1

## 2020-01-20 MED ORDER — PROPOFOL 500 MG/50ML IV EMUL
INTRAVENOUS | Status: AC
  Filled 2020-01-20: qty 50

## 2020-01-20 MED ORDER — ONDANSETRON HCL 4 MG/2ML IJ SOLN
INTRAMUSCULAR | Status: DC | PRN
  Administered 2020-01-20: 4 mg via INTRAVENOUS

## 2020-01-20 MED ORDER — CHLORHEXIDINE GLUCONATE 0.12 % MT SOLN
15.0000 mL | Freq: Once | OROMUCOSAL | Status: AC
  Administered 2020-01-20: 15 mL via OROMUCOSAL

## 2020-01-20 MED ORDER — PROPOFOL 500 MG/50ML IV EMUL
INTRAVENOUS | Status: DC | PRN
  Administered 2020-01-20: 130 ug/kg/min via INTRAVENOUS

## 2020-01-20 MED ORDER — ONDANSETRON HCL 4 MG/2ML IJ SOLN
INTRAMUSCULAR | Status: AC
  Filled 2020-01-20: qty 2

## 2020-01-20 MED ORDER — DIPHENHYDRAMINE HCL 50 MG/ML IJ SOLN
50.0000 mg | Freq: Once | INTRAMUSCULAR | Status: AC
  Administered 2020-01-20: 50 mg via INTRAVENOUS

## 2020-01-20 MED ORDER — LEVONORGESTREL 20 MCG/24HR IU IUD
1.0000 | INTRAUTERINE_SYSTEM | Freq: Once | INTRAUTERINE | Status: AC
  Administered 2020-01-20: 17:00:00 1 via INTRAUTERINE
  Filled 2020-01-20: qty 1

## 2020-01-20 MED ORDER — ORAL CARE MOUTH RINSE: 15.0000 mL | Freq: Once | OROMUCOSAL | Status: AC

## 2020-01-20 MED ORDER — PROPOFOL 10 MG/ML IV BOLUS
INTRAVENOUS | Status: DC | PRN
  Administered 2020-01-20: 20 mg via INTRAVENOUS
  Administered 2020-01-20: 30 mg via INTRAVENOUS
  Administered 2020-01-20: 50 mg via INTRAVENOUS

## 2020-01-20 MED ORDER — CHLORHEXIDINE GLUCONATE 0.12 % MT SOLN
OROMUCOSAL | Status: AC
  Filled 2020-01-20: qty 15

## 2020-01-20 MED ORDER — DEXAMETHASONE SODIUM PHOSPHATE 4 MG/ML IJ SOLN
INTRAMUSCULAR | Status: DC | PRN
  Administered 2020-01-20: 10 mg via INTRAVENOUS

## 2020-01-20 MED ORDER — IBUPROFEN 600 MG PO TABS: 600.0000 mg | ORAL_TABLET | Freq: Four times a day (QID) | ORAL | 0 refills | Status: DC | PRN

## 2020-01-20 MED ORDER — FENTANYL CITRATE (PF) 100 MCG/2ML IJ SOLN
INTRAMUSCULAR | Status: DC | PRN
  Administered 2020-01-20 (×5): 25 ug via INTRAVENOUS

## 2020-01-20 MED ORDER — SODIUM CHLORIDE FLUSH 0.9 % IV SOLN
INTRAVENOUS | Status: AC
  Filled 2020-01-20: qty 10

## 2020-01-20 MED ORDER — MIDAZOLAM HCL 2 MG/2ML IJ SOLN
INTRAMUSCULAR | Status: DC | PRN
  Administered 2020-01-20 (×2): 2 mg via INTRAVENOUS

## 2020-01-20 MED ORDER — LACTATED RINGERS IV SOLN: INTRAVENOUS | Status: DC

## 2020-01-20 MED ORDER — FERRIC SUBSULFATE 259 MG/GM EX SOLN
CUTANEOUS | Status: AC
  Filled 2020-01-20: qty 8

## 2020-01-20 MED ORDER — LEVONORGESTREL 20 MCG/24HR IU IUD
INTRAUTERINE_SYSTEM | INTRAUTERINE | Status: AC
  Filled 2020-01-20: qty 1

## 2020-01-20 SURGICAL SUPPLY — 20 items
BAG DRN RND TRDRP ANRFLXCHMBR (UROLOGICAL SUPPLIES)
BAG URINE DRAIN 2000ML AR STRL (UROLOGICAL SUPPLIES) IMPLANT
CATH FOLEY 2WAY  5CC 16FR (CATHETERS)
CATH FOLEY 2WAY 5CC 16FR (CATHETERS)
CATH ROBINSON RED A/P 16FR (CATHETERS) ×3 IMPLANT
CATH URTH 16FR FL 2W BLN LF (CATHETERS) IMPLANT
COVER WAND RF STERILE (DRAPES) ×3 IMPLANT
GAUZE SPONGE 4X4 16PLY XRAY LF (GAUZE/BANDAGES/DRESSINGS) ×2 IMPLANT
GLOVE BIO SURGEON STRL SZ7.5 (GLOVE) ×13 IMPLANT
GOWN STRL REUS W/ TWL LRG LVL3 (GOWN DISPOSABLE) ×2 IMPLANT
GOWN STRL REUS W/TWL LRG LVL3 (GOWN DISPOSABLE) ×6
KIT TURNOVER CYSTO (KITS) ×3 IMPLANT
MIRENA IUD ×2 IMPLANT
PACK DNC HYST (MISCELLANEOUS) ×3 IMPLANT
PAD OB MATERNITY 4.3X12.25 (PERSONAL CARE ITEMS) ×3 IMPLANT
PAD PREP 24X41 OB/GYN DISP (PERSONAL CARE ITEMS) ×3 IMPLANT
SYR 10ML LL (SYRINGE) IMPLANT
SYS MIRENA INTRAUTERINE (MISCELLANEOUS) ×3
SYSTEM MIRENA INTRAUTERINE (MISCELLANEOUS) IMPLANT
TOWEL OR 17X26 4PK STRL BLUE (TOWEL DISPOSABLE) ×3 IMPLANT

## 2020-01-20 NOTE — Interval H&P Note (Signed)
History and Physical Interval Note:  01/20/2020 3:45 PM  Katie Stanley  has presented today for surgery, with the diagnosis of mechanical complication of IUD Q24.49PN.  The various methods of treatment have been discussed with the patient and family. After consideration of risks, benefits and other options for treatment, the patient has consented to  Procedure(s): DILATATION AND CURETTAGE /HYSTEROSCOPY (N/A) INTRAUTERINE DEVICE (IUD) REMOVAL (N/A) INTRAUTERINE DEVICE (IUD) INSERTION MIRENA IUD (N/A) as a surgical intervention.  The patient's history has been reviewed, patient examined, no change in status, stable for surgery.  I have reviewed the patient's chart and labs.  Questions were answered to the patient's satisfaction.  We again discussed the possibility that the IUD would be embedded into the lining of her uterus. If I'm unable to remove the IUD, I will stop the procedure and we can discuss next steps at a later time.  Prentice Docker, MD, [facility], [facility] 01/20/2020 3:46 PM

## 2020-01-20 NOTE — Progress Notes (Signed)
In PACU RN called me to see patient. Noted swelling of hands with mild erythema. No other skin changes. Normal vitals, breathing on her own without difficulty. She was given benadryl 50 mg IV.  I notified her fiance that the procedure went well. I did let him know about her hands (above). He stated that this has been occurring recently and they have been trying to figure out what causes this to occur.    Will continue to monitor for other concerning changes.   Prentice Docker, MD, [facility], [facility] 01/20/2020 5:06 PM

## 2020-01-20 NOTE — Anesthesia Preprocedure Evaluation (Signed)
Anesthesia Evaluation  Patient identified by MRN, date of birth, ID band Patient awake    Reviewed: Allergy & Precautions, NPO status , Patient's Chart, lab work & pertinent test results  History of Anesthesia Complications Negative for: history of anesthetic complications  Airway Mallampati: II  TM Distance: >3 FB Neck ROM: Full    Dental  (+) Poor Dentition   Pulmonary neg sleep apnea, neg COPD, Current Smoker and Patient abstained from smoking.,    breath sounds clear to auscultation- rhonchi (-) wheezing      Cardiovascular Exercise Tolerance: Good (-) hypertension(-) CAD, (-) Past MI, (-) Cardiac Stents and (-) CABG  Rhythm:Regular Rate:Normal - Systolic murmurs and - Diastolic murmurs    Neuro/Psych neg Seizures PSYCHIATRIC DISORDERS Anxiety Depression negative neurological ROS     GI/Hepatic GERD  ,(+) Hepatitis -, C  Endo/Other  negative endocrine ROSneg diabetes  Renal/GU negative Renal ROS     Musculoskeletal negative musculoskeletal ROS (+)   Abdominal (+) + obese,   Peds  Hematology negative hematology ROS (+)   Anesthesia Other Findings Past Medical History: No date: Anxiety 12/07/2009: Biliary calculi No date: Cholelithiasis No date: Depression No date: GERD (gastroesophageal reflux disease) No date: Hepatitis C     Comment:  body "self healed" per patient No date: Palpitations No date: Substance abuse (HCC)   Reproductive/Obstetrics                             Anesthesia Physical Anesthesia Plan  ASA: II  Anesthesia Plan: General   Post-op Pain Management:    Induction: Intravenous  PONV Risk Score and Plan: 1 and Propofol infusion  Airway Management Planned: Natural Airway  Additional Equipment:   Intra-op Plan:   Post-operative Plan:   Informed Consent: I have reviewed the patients History and Physical, chart, labs and discussed the procedure  including the risks, benefits and alternatives for the proposed anesthesia with the patient or authorized representative who has indicated his/her understanding and acceptance.     Dental advisory given  Plan Discussed with: CRNA and Anesthesiologist  Anesthesia Plan Comments:         Anesthesia Quick Evaluation

## 2020-01-20 NOTE — Discharge Instructions (Signed)

## 2020-01-20 NOTE — Op Note (Signed)
Operative Note   01/20/2020  PRE-OP DIAGNOSIS: Retained intrauterine device [T83.39XA]   POST-OP DIAGNOSIS: Retained intrauterine device [T83.39XA]   SURGEON: Surgeon(s) and Role:    Will Bonnet, MD - Primary  PROCEDURE: Procedure(s): DILATATION AND CURETTAGE /HYSTEROSCOPY INTRAUTERINE DEVICE (IUD) REMOVAL INTRAUTERINE DEVICE (IUD) INSERTION MIRENA IUD   ANESTHESIA: MAC  ESTIMATED BLOOD LOSS: minimal  DRAINS: none   TOTAL IV FLUIDS: 600 mL  SPECIMENS:  none  VTE PROPHYLAXIS: SCDs to the bilateral lower extremities  ANTIBIOTICS: none indicated, none given  FLUID DEFICIT: n/a  COMPLICATIONS: none  DISPOSITION: PACU - hemodynamically stable.  CONDITION: stable  INDICATION: 34 y.o. female with a Paragard IUD who had attempted removal in office with multiple techniques and ultrasound guidance without success. She desired to have the intrauterine device removed and replaced with a Mirena intrauterine device.   FINDINGS: Exam under anesthesia revealed small, mobile anteverted uterus with no masses and bilateral adnexa without masses or fullness. Hysteroscopy revealed IUD strings at the internal cervical os.  PROCEDURE IN DETAIL:  After informed consent was obtained, the patient was taken to the operating room where anesthesia was obtained without difficulty. The patient was positioned in the dorsal, supine lithotomy position in [place].  The patient's bladder was catheterized with an in-and-out catheter.  The patient was examined under anesthesia, with the above noted findings.  The bi-valved speculum was placed inside the patient's vagina, and the the anterior lip of the cervix was grasped with the tenaculum.  The cervix was progressively dilated to a 12 French-Pratt dilator.  The hysteroscope was introduced into the cervix, with the above noted findings.  Since IUD strings were noted, an attempt was made with a packing forceps prior to entering the uterine cavity  with the hysteroscope.  The hystersocope was removed and the packing forceps introduced into the cervix. The IUD strings were grasped and gently traction applied. The IUD was removed in its entirety.   The Uterus was sound to 8.5 cm.  The Mirena IUD was placed without difficulty in accordance to the manufacturer's recommendations.  The strings were trimmed to 3 cm.  The tenaculum was removed from the cervix and hemostasis was noted and silver nitrate was applied to ensure ongoing hemostasis. All instruments were removed, with excellent hemostasis noted throughout.  She was then taken out of dorsal lithotomy.  The patient tolerated the procedure well.  Sponge, lap and needle counts were correct x2.  The patient was taken to recovery room in excellent condition.  Will Bonnet, MD, [place] 01/20/2020 4:48 PM

## 2020-01-20 NOTE — Transfer of Care (Signed)
Immediate Anesthesia Transfer of Care Note  Patient: Maloni Musleh Staszak  Procedure(s) Performed: DILATATION AND CURETTAGE /HYSTEROSCOPY (N/A ) INTRAUTERINE DEVICE (IUD) REMOVAL (N/A ) INTRAUTERINE DEVICE (IUD) INSERTION MIRENA IUD (N/A )  Patient Location: PACU  Anesthesia Type:General  Level of Consciousness: drowsy  Airway & Oxygen Therapy: Patient Spontanous Breathing and Patient connected to face mask oxygen  Post-op Assessment: Report given to RN and Post -op Vital signs reviewed and stable  Post vital signs: Reviewed and stable  Last Vitals:  Vitals Value Taken Time  BP 100/64 01/20/20 1702  Temp    Pulse 51 01/20/20 1705  Resp 10 01/20/20 1705  SpO2 98 % 01/20/20 1705  Vitals shown include unvalidated device data.  Last Pain:  Vitals:   01/20/20 1508  TempSrc: Temporal  PainSc: 0-No pain         Complications: No complications documented.

## 2020-01-21 ENCOUNTER — Encounter: Payer: Self-pay | Admitting: Obstetrics and Gynecology

## 2020-01-21 ENCOUNTER — Ambulatory Visit: Payer: Medicaid Other

## 2020-01-21 NOTE — Anesthesia Postprocedure Evaluation (Signed)
Anesthesia Post Note  Patient: Katie Stanley  Procedure(s) Performed: DILATATION AND CURETTAGE /HYSTEROSCOPY (N/A ) INTRAUTERINE DEVICE (IUD) REMOVAL (N/A ) INTRAUTERINE DEVICE (IUD) INSERTION MIRENA IUD (N/A )  Patient location during evaluation: PACU Anesthesia Type: General Level of consciousness: awake and alert and oriented Pain management: pain level controlled Vital Signs Assessment: post-procedure vital signs reviewed and stable Respiratory status: spontaneous breathing, nonlabored ventilation and respiratory function stable Cardiovascular status: blood pressure returned to baseline and stable Postop Assessment: no signs of nausea or vomiting Anesthetic complications: no   No complications documented.   Last Vitals:  Vitals:   01/20/20 1745 01/20/20 1756  BP: 119/76 136/88  Pulse: 61 (!) 55  Resp: 11   Temp:    SpO2: 99% 100%    Last Pain:  Vitals:   01/20/20 1745  TempSrc:   PainSc: 0-No pain                 Dillian Feig

## 2020-02-03 LAB — TYPE AND SCREEN
ABO/RH(D): A POS
Antibody Screen: NEGATIVE

## 2020-02-03 LAB — ABO/RH: ABO/RH(D): A POS

## 2020-02-09 ENCOUNTER — Encounter: Payer: Self-pay | Admitting: Obstetrics and Gynecology

## 2020-02-09 ENCOUNTER — Other Ambulatory Visit: Payer: Self-pay

## 2020-02-09 ENCOUNTER — Ambulatory Visit (INDEPENDENT_AMBULATORY_CARE_PROVIDER_SITE_OTHER): Payer: Medicaid Other | Admitting: Obstetrics and Gynecology

## 2020-02-09 VITALS — BP 140/98 | Ht 63.0 in | Wt 216.0 lb

## 2020-02-09 DIAGNOSIS — Z09 Encounter for follow-up examination after completed treatment for conditions other than malignant neoplasm: Secondary | ICD-10-CM

## 2020-02-09 NOTE — Progress Notes (Signed)
   Postoperative Follow-up Patient presents post op from an IUD removal (retained) 3 weeks ago for retained IUD.  Subjective: Patient reports some improvement in her preop symptoms. Eating a regular diet without difficulty. The patient is not having any pain.  Activity: normal activities of daily living.  She had a Mirena placed 3 weeks ago.  Since placement of her IUD she had some vaginal bleeding.  She reports cramping or discomfort.  She has not had intercourse since placement.  She has not checked the strings.  She denies any fever, chills, nausea, vomiting, or other complaints.    Objective: Vitals:   02/09/20 1059  BP: (!) 140/98   Vital Signs: BP (!) 140/98   Ht 5\' 3"  (1.6 m)   Wt 216 lb (98 kg)   BMI 38.26 kg/m  Constitutional: Well nourished, well developed female in no acute distress.  HEENT: normal Skin: Warm and dry.  Extremity: mild edema in bilateral hands  Abdomen: Soft, non-tender, normal bowel sounds; no bruits, organomegaly or masses.  Pelvic exam: (female chaperone present) is not limited by body habitus EGBUS: within normal limits Vagina: within normal limits and with normal mucosa blood in the vault Cervix: closed, IUD strings noted protruding from external os, not trimmed today.    Assessment: 34 y.o. s/p IUD removal and replacement progressing well  Plan: Patient has done well after surgery with no apparent complications.  I have discussed the post-operative course to date, and the expected progress moving forward.  The patient understands what complications to be concerned about.  I will see the patient in routine follow up, or sooner if needed.    Activity plan: No restriction.  Prentice Docker, MD 02/09/2020, 11:12 AM

## 2020-02-16 ENCOUNTER — Other Ambulatory Visit: Payer: Self-pay | Admitting: Gastroenterology

## 2020-02-16 ENCOUNTER — Other Ambulatory Visit: Payer: Self-pay | Admitting: Family Medicine

## 2020-02-16 NOTE — Telephone Encounter (Signed)
Called pt but unable to LM "mailbox full." Requested Prescriptions  Pending Prescriptions Disp Refills   citalopram (CELEXA) 20 MG tablet [Pharmacy Med Name: CITALOPRAM 20MG  TABLETS] 45 tablet 0    Sig: TAKE 1 AND 1/2 TABLETS(30 MG) BY MOUTH DAILY     Psychiatry:  Antidepressants - SSRI Failed - 02/16/2020  4:39 PM      Failed - Valid encounter within last 6 months    Recent Outpatient Visits          7 months ago Pelvic cramping   [place], Megan P, DO   9 months ago Tachycardia   [place], [place], DO   1 year ago Peripheral edema   [place], [place], DO   1 year ago Peripheral edema   [place], [place], DO   1 year ago Peripheral edema   [place], DO             Passed - Completed PHQ-2 or PHQ-9 in the last 360 days

## 2020-02-20 ENCOUNTER — Other Ambulatory Visit: Payer: Self-pay | Admitting: Gastroenterology

## 2020-03-02 ENCOUNTER — Other Ambulatory Visit: Payer: Self-pay | Admitting: Emergency Medicine

## 2020-03-03 ENCOUNTER — Other Ambulatory Visit: Payer: Self-pay | Admitting: Emergency Medicine

## 2020-03-03 ENCOUNTER — Ambulatory Visit: Payer: Medicaid Other | Attending: Family Medicine

## 2020-03-16 ENCOUNTER — Ambulatory Visit
Admission: RE | Admit: 2020-03-16 | Discharge: 2020-03-16 | Disposition: A | Discharge status: Home / Self Care | Payer: Medicaid Other | Source: Ambulatory Visit | Attending: Family Medicine | Admitting: Family Medicine

## 2020-03-16 ENCOUNTER — Other Ambulatory Visit: Payer: Self-pay

## 2020-03-16 DIAGNOSIS — Z13220 Encounter for screening for lipoid disorders: Secondary | ICD-10-CM | POA: Insufficient documentation

## 2020-03-16 DIAGNOSIS — R5383 Other fatigue: Secondary | ICD-10-CM | POA: Insufficient documentation

## 2020-03-16 DIAGNOSIS — E669 Obesity, unspecified: Secondary | ICD-10-CM | POA: Diagnosis not present

## 2020-03-16 MED ORDER — HEPARIN SOD (PORK) LOCK FLUSH 100 UNIT/ML IV SOLN
INTRAVENOUS | Status: AC
  Filled 2020-03-16: qty 5

## 2020-03-16 MED ORDER — HEPARIN SOD (PORK) LOCK FLUSH 100 UNIT/ML IV SOLN: 500.0000 [IU] | Freq: Once | INTRAVENOUS | Status: DC

## 2020-03-22 ENCOUNTER — Other Ambulatory Visit: Payer: Self-pay | Admitting: Family Medicine

## 2020-03-22 NOTE — Telephone Encounter (Signed)
Requested medication (s) are due for refill today: Yes  Requested medication (s) are on the active medication list: Yes  Last refill: 1 month ago  Future visit scheduled: No  Notes to clinic:  Unable to refill per protocol, appointment needed, courtesy refill already given     Requested Prescriptions  Pending Prescriptions Disp Refills   citalopram (CELEXA) 20 MG tablet [Pharmacy Med Name: CITALOPRAM 20MG  TABLETS] 45 tablet 0    Sig: TAKE 1 AND 1/2 TABLETS(30 MG) BY MOUTH DAILY      Psychiatry:  Antidepressants - SSRI Failed - 03/22/2020  5:53 PM      Failed - Valid encounter within last 6 months    Recent Outpatient Visits           8 months ago Pelvic cramping   [place], Megan P, DO   10 months ago Tachycardia   [place], [place], DO   1 year ago Peripheral edema   [place], [place], DO   1 year ago Peripheral edema   [place], [place], DO   1 year ago Peripheral edema   [place], DO                Passed - Completed PHQ-2 or PHQ-9 in the last 360 days

## 2020-03-23 NOTE — Telephone Encounter (Signed)
Routing to provider  

## 2020-03-24 NOTE — Telephone Encounter (Signed)
Pt verbalized understanding has apt on 04/22/2020

## 2020-04-21 DIAGNOSIS — Z20822 Contact with and (suspected) exposure to covid-19: Secondary | ICD-10-CM | POA: Diagnosis not present

## 2020-04-21 DIAGNOSIS — Z20828 Contact with and (suspected) exposure to other viral communicable diseases: Secondary | ICD-10-CM | POA: Diagnosis not present

## 2020-04-22 ENCOUNTER — Telehealth (INDEPENDENT_AMBULATORY_CARE_PROVIDER_SITE_OTHER): Payer: Medicaid Other | Admitting: Family Medicine

## 2020-04-22 ENCOUNTER — Other Ambulatory Visit: Payer: Self-pay | Admitting: Family Medicine

## 2020-04-22 ENCOUNTER — Encounter: Payer: Self-pay | Admitting: Family Medicine

## 2020-04-22 VITALS — Temp 98.4°F

## 2020-04-22 DIAGNOSIS — U071 COVID-19: Secondary | ICD-10-CM

## 2020-04-22 DIAGNOSIS — F419 Anxiety disorder, unspecified: Secondary | ICD-10-CM | POA: Diagnosis not present

## 2020-04-22 DIAGNOSIS — F329 Major depressive disorder, single episode, unspecified: Secondary | ICD-10-CM | POA: Diagnosis not present

## 2020-04-22 MED ORDER — BENZONATATE 200 MG PO CAPS: 200.0000 mg | ORAL_CAPSULE | Freq: Two times a day (BID) | ORAL | 0 refills | Status: DC | PRN

## 2020-04-22 MED ORDER — ALBUTEROL SULFATE HFA 108 (90 BASE) MCG/ACT IN AERS: 2.0000 | INHALATION_SPRAY | Freq: Four times a day (QID) | RESPIRATORY_TRACT | 0 refills | Status: DC | PRN

## 2020-04-22 MED ORDER — PREDNISONE 10 MG PO TABS: ORAL_TABLET | ORAL | 0 refills | Status: DC

## 2020-04-22 MED ORDER — CITALOPRAM HYDROBROMIDE 20 MG PO TABS
ORAL_TABLET | ORAL | 1 refills | Status: DC
Start: 2020-04-22 — End: 2020-04-23

## 2020-04-22 NOTE — Progress Notes (Signed)
Temp 98.4 F (36.9 C) (Oral)    Subjective:    Patient ID: Katie Stanley, female    DOB: 05/21/1985, 35 y.o.   MRN: [id number]  HPI: Katie Stanley is a 35 y.o. female  Chief Complaint  Patient presents with  . Covid Positive    Tested yesterday in [facility] and received results today Fatigue, nausea, chills, dizzy, cough, headache, little bit of congestion   UPPER RESPIRATORY TRACT INFECTION Duration: 5 days Worst symptom: fatigue, SOB Fever: yes Cough: yes Shortness of breath: yes Wheezing: no Chest pain: no Chest tightness: no Chest congestion: no Nasal congestion: no Runny nose: yes Post nasal drip: no Sneezing: no Sore throat: yes Swollen glands: yes Sinus pressure: yes Headache: yes Face pain: no Toothache: no Ear pain: yes "right Ear pressure: yes "right Eyes red/itching:no Eye drainage/crusting: no  Vomiting: no Rash: no Fatigue: yes Sick contacts: no Strep contacts: no  Context: worse Recurrent sinusitis: no Relief with OTC cold/cough medications: no  Treatments attempted: none   DEPRESSION Mood status: uncontrolled Satisfied with current treatment?: yes Symptom severity: mild  Duration of current treatment : chronic Side effects: no Medication compliance: excellent compliance Psychotherapy/counseling: no  Previous psychiatric medications: celexa Depressed mood: no Anxious mood: no Anhedonia: no Significant weight loss or gain: no Insomnia: no  Fatigue: yes Feelings of worthlessness or guilt: no Impaired concentration/indecisiveness: no Suicidal ideations: no Hopelessness: no Crying spells: no Depression screen [facility] 2/9 04/22/2020 05/12/2019 04/29/2018 11/20/2017 12/02/2015  Decreased Interest 0 3 0 1 0  Down, Depressed, Hopeless 0 2 0 0 0  PHQ - 2 Score 0 5 0 1 0  Altered sleeping 0 3 0 2 -  Tired, decreased energy 0 3 1 1  -  Change in appetite 0 2 1 1  -  Feeling bad or failure about yourself  0 2 0 1 -  Trouble concentrating 0  0 0 0 -  Moving slowly or fidgety/restless 0 0 0 0 -  Suicidal thoughts 0 0 0 0 -  PHQ-9 Score 0 15 2 6  -  Difficult doing work/chores - Somewhat difficult - - -     Relevant past medical, surgical, family and social history reviewed and updated as indicated. Interim medical history since our last visit reviewed. Allergies and medications reviewed and updated.  Review of Systems  Constitutional: Positive for fatigue. Negative for activity change, appetite change, chills, diaphoresis, fever and unexpected weight change.  HENT: Positive for congestion, ear pain, postnasal drip, rhinorrhea, sinus pressure and sinus pain. Negative for dental problem, drooling, ear discharge, facial swelling, hearing loss, mouth sores, nosebleeds, sneezing, sore throat, tinnitus, trouble swallowing and voice change.   Respiratory: Positive for cough and shortness of breath. Negative for apnea, choking, chest tightness, wheezing and stridor.   Cardiovascular: Negative.   Gastrointestinal: Negative.   Musculoskeletal: Negative.   Neurological: Negative.   Psychiatric/Behavioral: Negative.     Per HPI unless specifically indicated above     Objective:    Temp 98.4 F (36.9 C) (Oral)   Wt Readings from Last 3 Encounters:  02/09/20 216 lb (98 kg)  01/14/20 212 lb (96.2 kg)  01/12/20 212 lb (96.2 kg)    Physical Exam Vitals and nursing note reviewed.  Pulmonary:     Effort: Pulmonary effort is normal. No respiratory distress.     Comments: Speaking in full sentences Neurological:     Mental Status: She is alert.  Psychiatric:        Mood  and Affect: Mood normal.        Behavior: Behavior normal.        Thought Content: Thought content normal.        Judgment: Judgment normal.     Results for orders placed or performed during the hospital encounter of 23/53/61  Basic metabolic panel  Result Value Ref Range   Sodium 139 135 - 145 mmol/L   Potassium 2.8 (L) 3.5 - 5.1 mmol/L   Chloride 95 (L)  98 - 111 mmol/L   CO2 34 (H) 22 - 32 mmol/L   Glucose, Bld 97 70 - 99 mg/dL   BUN 7 6 - 20 mg/dL   Creatinine, Ser 0.50 0.44 - 1.00 mg/dL   Calcium 8.9 8.9 - 10.3 mg/dL   GFR, Estimated >60 >60 mL/min   Anion gap 10 5 - 15  Pregnancy, urine POC  Result Value Ref Range   Preg Test, Ur NEGATIVE NEGATIVE  Type and screen [place]  Result Value Ref Range   ABO/RH(D) A POS    Antibody Screen NEG    Sample Expiration      01/23/2020,2359 Performed at [facility], [address] [place], [place] [address]   ABO/Rh  Result Value Ref Range   ABO/RH(D)      A POS Performed at [facility], [address], [place],  [address]       Assessment & Plan:   Problem List Items Addressed This Visit      Other   Anxiety    Under good control on current regimen. Continue current regimen. Continue to monitor. Call with any concerns. Refills given.       Relevant Medications   citalopram (CELEXA) 20 MG tablet   Reactive depression    Under good control on current regimen. Continue current regimen. Continue to monitor. Call with any concerns. Refills given.      Relevant Medications   citalopram (CELEXA) 20 MG tablet    Other Visit Diagnoses    COVID-19    -  Primary   Self-quarantine until 10 days after onset of symptoms. Prednisone, albuterol and tessalon for comfort. Call if not getting better or getting worse.        Follow up plan: Return if symptoms worsen or fail to improve.   . This visit was completed via telephone due to the restrictions of the COVID-19 pandemic. All issues as above were discussed and addressed but no physical exam was performed. If it was felt that the patient should be evaluated in the office, they were directed there. The patient verbally consented to this visit. Patient was unable to complete an audio/visual visit due to Technical difficulties. Due to the catastrophic nature of the COVID-19 pandemic,  this visit was done through audio contact only. . Location of the patient: home . Location of the provider: home . Those involved with this call:  . Provider: Park Liter, DO . CMA: Frazier Butt, [place] . Front Desk/Registration: Jill Side  . Time spent on call: 25 minutes on the phone discussing health concerns. 40 minutes total spent in review of patient's record and preparation of their chart.

## 2020-04-22 NOTE — Assessment & Plan Note (Signed)
Under good control on current regimen. Continue current regimen. Continue to monitor. Call with any concerns. Refills given.   

## 2020-04-23 ENCOUNTER — Other Ambulatory Visit: Payer: Self-pay | Admitting: Nurse Practitioner

## 2020-04-27 ENCOUNTER — Ambulatory Visit: Payer: Medicaid Other

## 2020-04-29 ENCOUNTER — Encounter: Payer: Self-pay | Admitting: Family Medicine

## 2020-04-29 ENCOUNTER — Ambulatory Visit: Admission: RE | Admit: 2020-04-29 | Payer: Medicaid Other | Source: Ambulatory Visit

## 2020-05-04 ENCOUNTER — Ambulatory Visit (INDEPENDENT_AMBULATORY_CARE_PROVIDER_SITE_OTHER): Payer: Medicaid Other

## 2020-05-04 ENCOUNTER — Ambulatory Visit
Admission: EM | Admit: 2020-05-04 | Discharge: 2020-05-04 | Disposition: A | Discharge status: Home / Self Care | Payer: Medicaid Other | Attending: Family Medicine | Admitting: Family Medicine

## 2020-05-04 ENCOUNTER — Other Ambulatory Visit: Payer: Self-pay

## 2020-05-04 ENCOUNTER — Ambulatory Visit: Payer: Medicaid Other

## 2020-05-04 DIAGNOSIS — J0100 Acute maxillary sinusitis, unspecified: Secondary | ICD-10-CM

## 2020-05-04 DIAGNOSIS — R059 Cough, unspecified: Secondary | ICD-10-CM

## 2020-05-04 LAB — URINALYSIS, COMPLETE (UACMP) WITH MICROSCOPIC
Bacteria, UA: NONE SEEN
Bilirubin Urine: NEGATIVE
Glucose, UA: NEGATIVE mg/dL
Ketones, ur: NEGATIVE mg/dL
Leukocytes,Ua: NEGATIVE
Nitrite: NEGATIVE
Protein, ur: NEGATIVE mg/dL
Specific Gravity, Urine: 1.02 (ref 1.005–1.030)
pH: 8.5 — ABNORMAL HIGH (ref 5.0–8.0)

## 2020-05-04 MED ORDER — PROMETHAZINE-DM 6.25-15 MG/5ML PO SYRP: 5.0000 mL | ORAL_SOLUTION | Freq: Four times a day (QID) | ORAL | 0 refills | Status: DC | PRN

## 2020-05-04 MED ORDER — BENZONATATE 200 MG PO CAPS: 200.0000 mg | ORAL_CAPSULE | Freq: Three times a day (TID) | ORAL | 0 refills | Status: DC | PRN

## 2020-05-04 MED ORDER — DOXYCYCLINE HYCLATE 100 MG PO CAPS: 100.0000 mg | ORAL_CAPSULE | Freq: Two times a day (BID) | ORAL | 0 refills | Status: DC

## 2020-05-04 NOTE — Discharge Instructions (Addendum)
Take the Doxycycline twice daily for 10 days with food for your sinus infection.  Perform sinus irrigation 2-3 times a day with distilled water and a NeilMed sinus rinse kit.  Stopping smoking could help prevent further sinus inflammation.  Use the Tessalon Perles during the day and the Promethazine DM cough syrup at bedtime as needed for cough and congestion.

## 2020-05-04 NOTE — ED Triage Notes (Signed)
Covid+ 04/21/2020, pt reports having pressure in head and also sore throat. "I think I have a sinus infection".   Also reports having lower back pain, and urinary urgency and burring with urination. symptoms began 8 days ago.

## 2020-05-04 NOTE — ED Provider Notes (Signed)
[facility]    CSN: [id number] Arrival date & time: 05/04/20  1403      History   Chief Complaint Chief Complaint  Patient presents with  . sinus pressure  . Back Pain    HPI Katie Stanley is a 35 y.o. female.   HPI   35 year old female here for evaluation of head and sinus pressure and sore throat.  Patient reports that she has had symptoms for the past week.  Patient was diagnosed Covid positive on 04/21/2020.  She reports that she is having clear nasal discharge and a light yellow productive cough.  Patient denies shortness of breath, ear pain or pressure, or fever.  Additionally, patient is complaining of increased urinary urgency, frequency, and pain going on the last 8 days.  Patient states that she has had some cloudy urine, back pain, and chills.  Patient denies any blood in her urine.  Past Medical History:  Diagnosis Date  . Anxiety   . Biliary calculi 12/07/2009  . Cholelithiasis   . Depression   . GERD (gastroesophageal reflux disease)   . Hepatitis C    body "self healed" per patient  . Palpitations   . Substance abuse [facility])     Patient Active Problem List   Diagnosis Date Noted  . Retained intrauterine contraceptive device (IUD) 01/20/2020  . Paroxysmal tachycardia ([place]) 07/22/2019  . Leg edema 07/22/2019  . Morbid obesity ([place]) 04/29/2018  . Abdominal pain, epigastric   . Acute gastritis without hemorrhage   . Reflux esophagitis   . Gastroesophageal reflux disease 12/25/2017  . Chronic bilateral thoracic back pain 06/15/2016  . 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 Anxiety 02/25/2015  . 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 Chronic hepatitis C virus infection ([place]) 07/07/2009    Past Surgical History:  Procedure Laterality Date  . CHOLECYSTECTOMY     2011  . ESOPHAGOGASTRODUODENOSCOPY (EGD) WITH PROPOFOL N/A 01/21/2018   Procedure: ESOPHAGOGASTRODUODENOSCOPY (EGD) WITH Biopsy;  Surgeon: Lucilla Lame, MD;  Location:  [place];  Service: Endoscopy;  Laterality: N/A;  . HYSTEROSCOPY WITH D & C N/A 01/20/2020   Procedure: DILATATION AND CURETTAGE /HYSTEROSCOPY;  Surgeon: Will Bonnet, MD;  Location: [facility];  Service: Gynecology;  Laterality: N/A;  . INTRAUTERINE DEVICE (IUD) INSERTION N/A 01/20/2020   Procedure: INTRAUTERINE DEVICE (IUD) INSERTION MIRENA IUD;  Surgeon: Will Bonnet, MD;  Location: [facility];  Service: Gynecology;  Laterality: N/A;  . IUD REMOVAL N/A 01/20/2020   Procedure: INTRAUTERINE DEVICE (IUD) REMOVAL;  Surgeon: Will Bonnet, MD;  Location: [facility];  Service: Gynecology;  Laterality: N/A;  . PORTA CATH INSERTION N/A 01/01/2019   Procedure: PORTA CATH INSERTION;  Surgeon: Algernon Huxley, MD;  Location: [facility];  Service: Cardiovascular;  Laterality: N/A;    OB History   No obstetric history on file.      Home Medications    Prior to Admission medications   Medication Sig Start Date End Date Taking? Authorizing Provider  doxycycline (VIBRAMYCIN) 100 MG capsule Take 1 capsule (100 mg total) by mouth 2 (two) times daily. 05/04/20  Yes Margarette Canada, NP  promethazine-dextromethorphan (PROMETHAZINE-DM) 6.25-15 MG/5ML syrup Take 5 mLs by mouth 4 (four) times daily as needed. 05/04/20  Yes Margarette Canada, NP  albuterol (VENTOLIN HFA) 108 (90 Base) MCG/ACT inhaler Inhale 2 puffs into the lungs every 6 (six) hours as needed for wheezing or shortness of breath. 04/22/20   Valerie Roys, DO  benzonatate (TESSALON) 200 MG capsule Take 1 capsule (200 mg total) by mouth 3 (three) times daily as needed for cough. 05/04/20   Margarette Canada, NP  citalopram (CELEXA) 20 MG tablet TAKE 1 AND 1/2 TABLETS(30 MG) BY MOUTH DAILY 04/23/20   Wynetta Emery, Megan P, DO  ibuprofen (ADVIL) 600 MG tablet Take 1 tablet (600 mg total) by mouth every 6 (six) hours as needed for mild pain or cramping. 01/20/20   Will Bonnet, MD  metoprolol tartrate (LOPRESSOR) 25 MG tablet Take 1  tablet (25 mg total) by mouth daily. 01/20/20   Will Bonnet, MD  Naphazoline HCl (CLEAR EYES OP) Place 1 drop into both eyes daily as needed (itching).    [provider]  pantoprazole (PROTONIX) 40 MG tablet TAKE 1 TABLET(40 MG) BY MOUTH DAILY 02/23/20   Lucilla Lame, MD  predniSONE (DELTASONE) 10 MG tablet 6 tabs today and tomorrow, 5 tabs the next 2 days, decrease by 1 every other day until gone. 04/22/20   [place], Megan P, DO  SUBOXONE 8-2 MG FILM Place 1 Film under the tongue 2 (two) times daily.  12/13/17   [provider]    Family History Family History  Problem Relation Age of Onset  . Hypertension Mother   . Diabetes Father   . Hypertension Father   . Liver cancer Maternal Grandmother   . Hypertension Maternal Grandfather   . Breast cancer Paternal Grandmother   . Heart disease Paternal Grandfather     Social History Social History   Tobacco Use  . Smoking status: Current Every Day Smoker    Packs/day: 1.00    Years: 12.00    Pack years: 12.00    Types: Cigarettes  . Smokeless tobacco: Never Used  Vaping Use  . Vaping Use: Never used  Substance Use Topics  . Alcohol use: Yes    Comment: 6 BEERS SOME DAY  . Drug use: Yes    Types: Marijuana     Allergies   Penicillins   Review of Systems Review of Systems  Constitutional: Positive for chills and diaphoresis. Negative for activity change, appetite change and fever.  HENT: Positive for congestion, rhinorrhea, sinus pressure and sinus pain. Negative for ear pain.   Respiratory: Positive for cough. Negative for shortness of breath and wheezing.   Genitourinary: Positive for dysuria, frequency and urgency. Negative for hematuria.  Musculoskeletal: Positive for back pain.  Hematological: Negative.   Psychiatric/Behavioral: Negative.      Physical Exam Triage Vital Signs ED Triage Vitals  Enc Vitals Group     BP 05/04/20 1415 (!) 148/116     Pulse Rate 05/04/20 1415 74     Resp  05/04/20 1415 18     Temp 05/04/20 1415 98.5 F (36.9 C)     Temp Source 05/04/20 1415 Oral     SpO2 05/04/20 1415 99 %     Weight 05/04/20 1416 215 lb (97.5 kg)     Height 05/04/20 1416 5' 3" (1.6 m)     Head Circumference --      Peak Flow --      Pain Score 05/04/20 1415 5     Pain Loc --      Pain Edu? --      Excl. in [place]? --    No data found.  Updated Vital Signs BP (!) 148/116   Pulse 74   Temp 98.5 F (36.9 C) (Oral)   Resp 18   Ht 5' 3" (1.6  m)   Wt 215 lb (97.5 kg)   SpO2 99%   BMI 38.09 kg/m   Visual Acuity Right Eye Distance:   Left Eye Distance:   Bilateral Distance:    Right Eye Near:   Left Eye Near:    Bilateral Near:     Physical Exam Vitals and nursing note reviewed.  Constitutional:      General: She is not in acute distress.    Appearance: Normal appearance. She is ill-appearing.  HENT:     Head: Normocephalic and atraumatic.     Right Ear: Tympanic membrane, ear canal and external ear normal.     Left Ear: Tympanic membrane, ear canal and external ear normal.     Nose: Congestion and rhinorrhea present.     Comments: Nasal mucosa is erythematous and edematous with clear nasal discharge.  Patient has tenderness to percussion over her maxillary sinuses.    Mouth/Throat:     Mouth: Mucous membranes are moist.     Pharynx: Oropharynx is clear. No posterior oropharyngeal erythema.  Cardiovascular:     Rate and Rhythm: Normal rate.     Pulses: Normal pulses.     Heart sounds: Normal heart sounds. No murmur heard. No gallop.   Pulmonary:     Effort: Pulmonary effort is normal.     Breath sounds: Normal breath sounds. No wheezing, rhonchi or rales.  Abdominal:     Tenderness: There is no right CVA tenderness or left CVA tenderness.  Musculoskeletal:     Cervical back: Normal range of motion and neck supple.  Lymphadenopathy:     Cervical: No cervical adenopathy.  Skin:    General: Skin is warm and dry.     Capillary Refill: Capillary  refill takes less than 2 seconds.     Findings: No erythema or rash.  Neurological:     General: No focal deficit present.     Mental Status: She is alert and oriented to person, place, and time.  Psychiatric:        Mood and Affect: Mood normal.        Behavior: Behavior normal.        Thought Content: Thought content normal.        Judgment: Judgment normal.      UC Treatments / Results  Labs (all labs ordered are listed, but only abnormal results are displayed) Labs Reviewed  URINALYSIS, COMPLETE (UACMP) WITH MICROSCOPIC - Abnormal; Notable for the following components:      Result Value   APPearance HAZY (*)    pH 8.5 (*)    Hgb urine dipstick MODERATE (*)    All other components within normal limits    EKG   Radiology DG Chest 2 View  Result Date: 05/04/2020 CLINICAL DATA:  Cough EXAM: CHEST - 2 VIEW COMPARISON:  12/19/2014 FINDINGS: Right IJ approach Port-A-Cath with distal tip terminating at the level of the distal SVC. The heart size and mediastinal contours are within normal limits. Both lungs are clear. The visualized skeletal structures are unremarkable. IMPRESSION: No active cardiopulmonary disease. Electronically Signed   By: Davina Poke D.O.   On: 05/04/2020 15:00    Procedures Procedures (including critical care time)  Medications Ordered in UC Medications - No data to display  Initial Impression / Assessment and Plan / UC Course  I have reviewed the triage vital signs and the nursing notes.  Pertinent labs & imaging results that were available during my care of the patient were reviewed  by me and considered in my medical decision making (see chart for details).   Patient is here for evaluation of multiple complaints.  Firstly, she is complaining of sinus pressure and head pressure this been going on for the past week.  Patient was diagnosed Covid +2 weeks ago.  Patient does have erythematous and edematous nasal mucosa with tenderness to percussion  over her maxillary sinuses.  Nasal discharge is clear.  Patient has no cervical lymphadenopathy on exam and her lung sounds are clear in all fields.  Secondly, she is complaining of painful urination with increased urgency and frequency going on 8 days.  She reports her urine is cloudy and she has had some back pain.  No blood in her urine.  Will obtain chest x-ray and check UA.  Urinalysis shows moderate hemoglobin but no leukocytes, nitrites, protein, or WBCs.  Chest x-ray interpretation by radiology is that is negative for acute intrathoracic process.  We will treat patient for maxillary sinusitis with Doxycycline twice daily x10 days, give Tessalon Perles and Promethazine DM for cough and congestion.  Final Clinical Impressions(s) / UC Diagnoses   Final diagnoses:  Acute non-recurrent maxillary sinusitis     Discharge Instructions     Take the Augmentin twice daily for 10 days with food for your sinus infection.  Perform sinus irrigation 2-3 times a day with distilled water and a NeilMed sinus rinse kit.  Stopping smoking could help prevent further sinus inflammation.  Use the Tessalon Perles during the day and the Promethazine DM cough syrup at bedtime as needed for cough and congestion.    ED Prescriptions    Medication Sig Dispense Auth. Provider   benzonatate (TESSALON) 200 MG capsule Take 1 capsule (200 mg total) by mouth 3 (three) times daily as needed for cough. 30 capsule Margarette Canada, NP   promethazine-dextromethorphan (PROMETHAZINE-DM) 6.25-15 MG/5ML syrup Take 5 mLs by mouth 4 (four) times daily as needed. 118 mL Margarette Canada, NP   doxycycline (VIBRAMYCIN) 100 MG capsule Take 1 capsule (100 mg total) by mouth 2 (two) times daily. 20 capsule Margarette Canada, NP     PDMP not reviewed this encounter.   Margarette Canada, NP 05/04/20 [id number]

## 2020-05-20 ENCOUNTER — Telehealth: Payer: Self-pay

## 2020-05-20 ENCOUNTER — Other Ambulatory Visit: Payer: Self-pay

## 2020-05-20 ENCOUNTER — Ambulatory Visit
Admission: RE | Admit: 2020-05-20 | Discharge: 2020-05-20 | Disposition: A | Discharge status: Home / Self Care | Payer: Medicaid Other | Source: Ambulatory Visit | Attending: Family Medicine | Admitting: Family Medicine

## 2020-05-20 DIAGNOSIS — Z452 Encounter for adjustment and management of vascular access device: Secondary | ICD-10-CM | POA: Diagnosis not present

## 2020-05-20 MED ORDER — HEPARIN SOD (PORK) LOCK FLUSH 100 UNIT/ML IV SOLN
INTRAVENOUS | Status: AC
  Administered 2020-05-20: 500 [IU]
  Filled 2020-05-20: qty 5

## 2020-05-20 MED ORDER — HEPARIN SOD (PORK) LOCK FLUSH 100 UNIT/ML IV SOLN: 500.0000 [IU] | INTRAVENOUS | Status: AC | PRN

## 2020-05-20 NOTE — Discharge Instructions (Signed)
Next appointment for port flush 07/01/20 at 1:30 pm, please call [phone] if you need to reschedule.

## 2020-05-20 NOTE — Telephone Encounter (Signed)
Order written, signed by Dr. Wynetta Emery and faxed as requested.

## 2020-05-20 NOTE — Telephone Encounter (Signed)
-----   Message from Valerie Roys, [place] sent at 05/20/2020 10:23 AM EST ----- Please write out below to be faxed to [facility], [phone] flush port Q 6 weeks with heparin as per protocol  And I'll sign. Thanks!

## 2020-06-02 ENCOUNTER — Other Ambulatory Visit: Payer: Self-pay | Admitting: Gastroenterology

## 2020-06-02 ENCOUNTER — Other Ambulatory Visit: Payer: Self-pay | Admitting: Cardiovascular Disease

## 2020-06-02 DIAGNOSIS — T8339XA Other mechanical complication of intrauterine contraceptive device, initial encounter: Secondary | ICD-10-CM

## 2020-06-03 ENCOUNTER — Other Ambulatory Visit: Payer: Self-pay | Admitting: Cardiovascular Disease

## 2020-06-03 ENCOUNTER — Other Ambulatory Visit: Payer: Self-pay | Admitting: Gastroenterology

## 2020-06-03 DIAGNOSIS — T8339XA Other mechanical complication of intrauterine contraceptive device, initial encounter: Secondary | ICD-10-CM

## 2020-06-04 ENCOUNTER — Telehealth: Payer: Self-pay | Admitting: Gastroenterology

## 2020-06-04 NOTE — Telephone Encounter (Signed)
pantoprazole (PROTONIX) 40 MG tablet [[id number]]   [id number], [place] [address]  [place], [place] [address]  Phone:  [phone] Fax:  [phone]  DEA #:  [id number]  84 days

## 2020-06-21 ENCOUNTER — Ambulatory Visit: Payer: Medicaid Other | Admitting: Gastroenterology

## 2020-06-21 ENCOUNTER — Encounter: Payer: Self-pay | Admitting: Gastroenterology

## 2020-06-21 ENCOUNTER — Other Ambulatory Visit: Payer: Self-pay

## 2020-06-21 VITALS — BP 138/84 | HR 84 | Temp 97.3°F | Ht 63.0 in | Wt 217.8 lb

## 2020-06-21 DIAGNOSIS — K219 Gastro-esophageal reflux disease without esophagitis: Secondary | ICD-10-CM

## 2020-06-21 NOTE — Progress Notes (Signed)
Primary Care Physician: Valerie Roys, DO  Primary Gastroenterologist:  Dr. Lucilla Lame  Chief Complaint  Patient presents with  . Medication Refill    GERD    HPI: Katie Stanley is a 35 y.o. female here at the see me in the past for abdominal pain.  At that time the patient was reporting that her abdominal pain had resolved with a PPI.  The patient was put on Protonix 40 mg a day.  The patient now is here for a medication refill.  The patient reports that she has some acid breakthrough and has to take some over-the-counter medications but it is usually related to something she may have eaten.  The patient denies any black stools or bloody stools.  She also denies any unexplained weight loss fevers chills nausea or vomiting.  Past Medical History:  Diagnosis Date  . Anxiety   . Biliary calculi 12/07/2009  . Cholelithiasis   . Depression   . GERD (gastroesophageal reflux disease)   . Hepatitis C    body "self healed" per patient  . Palpitations   . Substance abuse ([place])     Current Outpatient Medications  Medication Sig Dispense Refill  . albuterol (VENTOLIN HFA) 108 (90 Base) MCG/ACT inhaler Inhale 2 puffs into the lungs every 6 (six) hours as needed for wheezing or shortness of breath. 8 g 0  . citalopram (CELEXA) 20 MG tablet TAKE 1 AND 1/2 TABLETS(30 MG) BY MOUTH DAILY 45 tablet 0  . ibuprofen (ADVIL) 600 MG tablet Take 1 tablet (600 mg total) by mouth every 6 (six) hours as needed for mild pain or cramping. 30 tablet 0  . metoprolol tartrate (LOPRESSOR) 25 MG tablet TAKE 1 TABLET(25 MG) BY MOUTH TWICE DAILY AS NEEDED 60 tablet 0  . Naphazoline HCl (CLEAR EYES OP) Place 1 drop into both eyes daily as needed (itching).    . pantoprazole (PROTONIX) 40 MG tablet Take 1 tablet (40 mg total) by mouth daily. **PLEASE SCHEDULE FOLLOW UP FOR ADDITIONAL REFILLS** 90 tablet 0  . promethazine-dextromethorphan (PROMETHAZINE-DM) 6.25-15 MG/5ML syrup Take 5 mLs by mouth 4 (four)  times daily as needed. (Patient taking differently: Take 5 mLs by mouth 4 (four) times daily as needed.) 118 mL 0  . SUBOXONE 8-2 MG FILM Place 1 Film under the tongue 2 (two) times daily.   0   No current facility-administered medications for this visit.    Allergies as of 06/21/2020 - Review Complete 06/21/2020  Allergen Reaction Noted  . Penicillins Swelling 12/19/2014    ROS:  General: Negative for anorexia, weight loss, fever, chills, fatigue, weakness. ENT: Negative for hoarseness, difficulty swallowing , nasal congestion. CV: Negative for chest pain, angina, palpitations, dyspnea on exertion, peripheral edema.  Respiratory: Negative for dyspnea at rest, dyspnea on exertion, cough, sputum, wheezing.  GI: See history of present illness. GU:  Negative for dysuria, hematuria, urinary incontinence, urinary frequency, nocturnal urination.  Endo: Negative for unusual weight change.    Physical Examination:   BP 138/84   Pulse 84   Temp (!) 97.3 F (36.3 C) (Temporal)   Ht 5\' 3"  (1.6 m)   Wt 217 lb 12.8 oz (98.8 kg)   BMI 38.58 kg/m   General: Well-nourished, well-developed in no acute distress.  Eyes: No icterus. Conjunctivae pink. Lungs: Clear to auscultation bilaterally. Non-labored. Heart: Regular rate and rhythm, no murmurs rubs or gallops.  Abdomen: Bowel sounds are normal, nontender, nondistended, no hepatosplenomegaly or masses, no abdominal bruits  or hernia , no rebound or guarding.   Extremities: No lower extremity edema. No clubbing or deformities. Neuro: Alert and oriented x 3.  Grossly intact. Skin: Warm and dry, no jaundice.   Psych: Alert and cooperative, normal mood and affect.  Labs:    Imaging Studies: No results found.  Assessment and Plan:   Katie Stanley is a 35 y.o. y/o female Who comes in today with a history of reflux and a need for refill of her prescription.  The patient will be given a refill of her Protonix.  The patient has been doing  well and supplements medication with some acid reducers when she has acid breakthrough.  The patient has no way symptoms and will contact me if she needs a refill of her medications in the future.     Lucilla Lame, MD. [name]    Note: This dictation was prepared with Dragon dictation along with smaller phrase technology. Any transcriptional errors that result from this process are unintentional.

## 2020-07-01 ENCOUNTER — Inpatient Hospital Stay: Admission: RE | Admit: 2020-07-01 | Payer: Medicaid Other | Source: Ambulatory Visit

## 2020-07-12 ENCOUNTER — Ambulatory Visit
Admission: RE | Admit: 2020-07-12 | Discharge: 2020-07-12 | Disposition: A | Discharge status: Home / Self Care | Payer: Medicaid Other | Source: Ambulatory Visit | Attending: Gastroenterology | Admitting: Gastroenterology

## 2020-07-12 ENCOUNTER — Other Ambulatory Visit: Payer: Self-pay

## 2020-07-12 DIAGNOSIS — Z452 Encounter for adjustment and management of vascular access device: Secondary | ICD-10-CM | POA: Insufficient documentation

## 2020-07-12 MED ORDER — HEPARIN SOD (PORK) LOCK FLUSH 100 UNIT/ML IV SOLN: 500.0000 [IU] | Freq: Once | INTRAVENOUS | Status: AC

## 2020-07-12 MED ORDER — HEPARIN SOD (PORK) LOCK FLUSH 100 UNIT/ML IV SOLN
INTRAVENOUS | Status: AC
  Administered 2020-07-12: 500 [IU] via INTRAVENOUS
  Filled 2020-07-12: qty 5

## 2020-07-27 ENCOUNTER — Other Ambulatory Visit: Payer: Self-pay | Admitting: Cardiovascular Disease

## 2020-07-27 ENCOUNTER — Other Ambulatory Visit: Payer: Self-pay | Admitting: Gastroenterology

## 2020-07-27 DIAGNOSIS — T8339XA Other mechanical complication of intrauterine contraceptive device, initial encounter: Secondary | ICD-10-CM

## 2020-08-23 ENCOUNTER — Ambulatory Visit: Admission: RE | Admit: 2020-08-23 | Payer: Medicaid Other | Source: Ambulatory Visit

## 2020-09-03 ENCOUNTER — Ambulatory Visit
Admission: RE | Admit: 2020-09-03 | Discharge: 2020-09-03 | Disposition: A | Discharge status: Home / Self Care | Payer: Medicaid Other | Source: Ambulatory Visit | Attending: Gastroenterology | Admitting: Gastroenterology

## 2020-09-03 ENCOUNTER — Other Ambulatory Visit: Payer: Self-pay

## 2020-09-03 DIAGNOSIS — Z452 Encounter for adjustment and management of vascular access device: Secondary | ICD-10-CM | POA: Diagnosis present

## 2020-09-03 MED ORDER — HEPARIN SOD (PORK) LOCK FLUSH 100 UNIT/ML IV SOLN
INTRAVENOUS | Status: AC
  Administered 2020-09-03: 500 [IU] via INTRAVENOUS
  Filled 2020-09-03: qty 5

## 2020-09-03 MED ORDER — HEPARIN SOD (PORK) LOCK FLUSH 100 UNIT/ML IV SOLN: 500.0000 [IU] | Freq: Once | INTRAVENOUS | Status: AC

## 2020-10-15 ENCOUNTER — Ambulatory Visit
Admission: RE | Admit: 2020-10-15 | Discharge: 2020-10-15 | Disposition: A | Discharge status: Home / Self Care | Payer: Medicaid Other | Source: Ambulatory Visit | Attending: Gastroenterology | Admitting: Gastroenterology

## 2020-10-15 ENCOUNTER — Other Ambulatory Visit: Payer: Self-pay

## 2020-10-15 DIAGNOSIS — K219 Gastro-esophageal reflux disease without esophagitis: Secondary | ICD-10-CM | POA: Diagnosis present

## 2020-10-15 MED ORDER — HEPARIN SOD (PORK) LOCK FLUSH 100 UNIT/ML IV SOLN
500.0000 [IU] | INTRAVENOUS | Status: AC | PRN
  Administered 2020-10-15: 500 [IU]

## 2020-10-15 MED ORDER — HEPARIN SOD (PORK) LOCK FLUSH 100 UNIT/ML IV SOLN
INTRAVENOUS | Status: AC
  Filled 2020-10-15: qty 5

## 2020-10-15 NOTE — Progress Notes (Signed)
Pt came in for the 4th time for Port-flush. Tolerated well. Paper orders placed in hard chart.

## 2020-11-26 ENCOUNTER — Ambulatory Visit
Admission: RE | Admit: 2020-11-26 | Discharge: 2020-11-26 | Disposition: A | Discharge status: Home / Self Care | Payer: Medicaid Other | Source: Ambulatory Visit | Attending: Gastroenterology | Admitting: Gastroenterology

## 2020-12-03 ENCOUNTER — Ambulatory Visit: Admission: RE | Admit: 2020-12-03 | Payer: Medicaid Other | Source: Ambulatory Visit

## 2020-12-06 ENCOUNTER — Ambulatory Visit
Admission: RE | Admit: 2020-12-06 | Discharge: 2020-12-06 | Disposition: A | Discharge status: Home / Self Care | Payer: Medicaid Other | Source: Ambulatory Visit | Attending: Gastroenterology | Admitting: Gastroenterology

## 2020-12-06 ENCOUNTER — Other Ambulatory Visit: Payer: Self-pay

## 2020-12-06 DIAGNOSIS — Z452 Encounter for adjustment and management of vascular access device: Secondary | ICD-10-CM | POA: Insufficient documentation

## 2020-12-06 MED ORDER — HEPARIN SOD (PORK) LOCK FLUSH 100 UNIT/ML IV SOLN: 500.0000 [IU] | Freq: Once | INTRAVENOUS | Status: AC

## 2020-12-06 MED ORDER — HEPARIN SOD (PORK) LOCK FLUSH 100 UNIT/ML IV SOLN
INTRAVENOUS | Status: AC
  Administered 2020-12-06: 500 [IU] via INTRAVENOUS
  Filled 2020-12-06: qty 5

## 2020-12-25 ENCOUNTER — Other Ambulatory Visit: Payer: Self-pay | Admitting: Family Medicine

## 2020-12-27 NOTE — Telephone Encounter (Signed)
Appointment scheduled for 12/31/20 for medication review. 30 day courtesy supply provided.

## 2020-12-31 ENCOUNTER — Ambulatory Visit (INDEPENDENT_AMBULATORY_CARE_PROVIDER_SITE_OTHER): Payer: Medicaid Other | Admitting: Family Medicine

## 2020-12-31 ENCOUNTER — Encounter: Payer: Self-pay | Admitting: Family Medicine

## 2020-12-31 ENCOUNTER — Other Ambulatory Visit: Payer: Self-pay | Admitting: Family Medicine

## 2020-12-31 ENCOUNTER — Other Ambulatory Visit: Payer: Self-pay

## 2020-12-31 VITALS — BP 136/90 | HR 92 | Temp 98.5°F | Ht 62.7 in | Wt 217.2 lb

## 2020-12-31 DIAGNOSIS — Z23 Encounter for immunization: Secondary | ICD-10-CM | POA: Diagnosis not present

## 2020-12-31 DIAGNOSIS — F1021 Alcohol dependence, in remission: Secondary | ICD-10-CM | POA: Insufficient documentation

## 2020-12-31 DIAGNOSIS — F1010 Alcohol abuse, uncomplicated: Secondary | ICD-10-CM | POA: Diagnosis not present

## 2020-12-31 DIAGNOSIS — R1013 Epigastric pain: Secondary | ICD-10-CM

## 2020-12-31 DIAGNOSIS — F339 Major depressive disorder, recurrent, unspecified: Secondary | ICD-10-CM

## 2020-12-31 MED ORDER — PANTOPRAZOLE SODIUM 40 MG PO TBEC: DELAYED_RELEASE_TABLET | ORAL | 1 refills | Status: DC

## 2020-12-31 MED ORDER — BUPROPION HCL ER (SR) 150 MG PO TB12
150.0000 mg | ORAL_TABLET | Freq: Two times a day (BID) | ORAL | 3 refills | Status: DC
Start: 2020-12-31 — End: 2021-05-16

## 2020-12-31 MED ORDER — SUCRALFATE 1 G PO TABS: 1.0000 g | ORAL_TABLET | Freq: Three times a day (TID) | ORAL | 1 refills | Status: DC

## 2020-12-31 MED ORDER — CITALOPRAM HYDROBROMIDE 20 MG PO TABS
30.0000 mg | ORAL_TABLET | Freq: Every day | ORAL | 1 refills | Status: DC
Start: 2020-12-31 — End: 2021-05-16

## 2020-12-31 NOTE — Progress Notes (Signed)
BP 136/90 (BP Location: Left Arm, Cuff Size: Normal)   Pulse 92   Temp 98.5 F (36.9 C) (Oral)   Ht 5' 2.7" (1.593 m)   Wt 217 lb 3.2 oz (98.5 kg)   SpO2 97%   BMI 38.84 kg/m    Subjective:    Patient ID: Katie Stanley, female    DOB: January 07, 1986, 35 y.o.   MRN: [id number]  HPI: UNNAMED HINO is a 35 y.o. female  Chief Complaint  Patient presents with   Anxiety   Gastroesophageal Reflux   Alcohol has become a bigger problem. Drinking about 1/5 a day. Would like to cut down, but having trouble. Feeling very depressed.   ANXIETY/Depression Duration: chronic Status: exacerbated Anxious mood: yes  Excessive worrying: yes Irritability: yes  Sweating: no Nausea: no Palpitations:no Hyperventilation: no Panic attacks: no Agoraphobia: no  Obscessions/compulsions: no Depressed mood: yes Depression screen [facility] 2/9 12/31/2020 04/22/2020 05/12/2019 04/29/2018 11/20/2017  Decreased Interest 3 0 3 0 1  Down, Depressed, Hopeless 3 0 2 0 0  PHQ - 2 Score 6 0 5 0 1  Altered sleeping 3 0 3 0 2  Tired, decreased energy 3 0 3 1 1   Change in appetite 3 0 2 1 1   Feeling bad or failure about yourself  2 0 2 0 1  Trouble concentrating 1 0 0 0 0  Moving slowly or fidgety/restless 0 0 0 0 0  Suicidal thoughts 0 0 0 0 0  PHQ-9 Score 18 0 15 2 6   Difficult doing work/chores Somewhat difficult - Somewhat difficult - -   Anhedonia: yes Weight changes: no Insomnia: no   Hypersomnia: yes Fatigue/loss of energy: yes Feelings of worthlessness: yes Feelings of guilt: yes Impaired concentration/indecisiveness: yes Suicidal ideations: no  Crying spells: yes Recent Stressors/Life Changes: yes   Relationship problems: no   Family stress: no     Financial stress: no    Job stress: no    Recent death/loss: no  GERD GERD control status: exacerbated Satisfied with current treatment? no Heartburn frequency: daily Medication side effects: no  Medication compliance: fair Dysphagia:  no Odynophagia:  no Hematemesis: no Blood in stool: no EGD: no  Relevant past medical, surgical, family and social history reviewed and updated as indicated. Interim medical history since our last visit reviewed. Allergies and medications reviewed and updated.  Review of Systems  Constitutional: Negative.   Respiratory: Negative.    Cardiovascular: Negative.   Gastrointestinal:  Positive for abdominal pain. Negative for abdominal distention, anal bleeding, blood in stool, constipation, diarrhea, nausea, rectal pain and vomiting.  Hematological: Negative.   Psychiatric/Behavioral:  Positive for dysphoric mood. Negative for agitation, behavioral problems, confusion, decreased concentration, hallucinations, self-injury, sleep disturbance and suicidal ideas. The patient is nervous/anxious. The patient is not hyperactive.    Per HPI unless specifically indicated above     Objective:    BP 136/90 (BP Location: Left Arm, Cuff Size: Normal)   Pulse 92   Temp 98.5 F (36.9 C) (Oral)   Ht 5' 2.7" (1.593 m)   Wt 217 lb 3.2 oz (98.5 kg)   SpO2 97%   BMI 38.84 kg/m   Wt Readings from Last 3 Encounters:  12/31/20 217 lb 3.2 oz (98.5 kg)  06/21/20 217 lb 12.8 oz (98.8 kg)  05/04/20 215 lb (97.5 kg)    Physical Exam Vitals and nursing note reviewed.  Constitutional:      General: She is not in acute distress.  Appearance: Normal appearance. She is not ill-appearing, toxic-appearing or diaphoretic.  HENT:     Head: Normocephalic and atraumatic.     Right Ear: External ear normal.     Left Ear: External ear normal.     Nose: Nose normal.     Mouth/Throat:     Mouth: Mucous membranes are moist.     Pharynx: Oropharynx is clear.  Eyes:     General: No scleral icterus.       Right eye: No discharge.        Left eye: No discharge.     Extraocular Movements: Extraocular movements intact.     Conjunctiva/sclera: Conjunctivae normal.     Pupils: Pupils are equal, round, and reactive  to light.  Cardiovascular:     Rate and Rhythm: Normal rate and regular rhythm.     Pulses: Normal pulses.     Heart sounds: Normal heart sounds. No murmur heard.   No friction rub. No gallop.  Pulmonary:     Effort: Pulmonary effort is normal. No respiratory distress.     Breath sounds: Normal breath sounds. No stridor. No wheezing, rhonchi or rales.  Chest:     Chest wall: No tenderness.  Musculoskeletal:        General: Normal range of motion.     Cervical back: Normal range of motion and neck supple.  Skin:    General: Skin is warm and dry.     Capillary Refill: Capillary refill takes less than 2 seconds.     Coloration: Skin is not jaundiced or pale.     Findings: No bruising, erythema, lesion or rash.  Neurological:     General: No focal deficit present.     Mental Status: She is alert and oriented to person, place, and time. Mental status is at baseline.  Psychiatric:        Mood and Affect: Mood normal.        Behavior: Behavior normal.        Thought Content: Thought content normal.        Judgment: Judgment normal.    Results for orders placed or performed during the hospital encounter of 05/04/20  Urinalysis, Complete w Microscopic Urine, Clean Catch  Result Value Ref Range   Color, Urine YELLOW YELLOW   APPearance HAZY (A) CLEAR   Specific Gravity, Urine 1.020 1.005 - 1.030   pH 8.5 (H) 5.0 - 8.0   Glucose, UA NEGATIVE NEGATIVE mg/dL   Hgb urine dipstick MODERATE (A) NEGATIVE   Bilirubin Urine NEGATIVE NEGATIVE   Ketones, ur NEGATIVE NEGATIVE mg/dL   Protein, ur NEGATIVE NEGATIVE mg/dL   Nitrite NEGATIVE NEGATIVE   Leukocytes,Ua NEGATIVE NEGATIVE   Squamous Epithelial / LPF 6-10 0 - 5   WBC, UA 0-5 0 - 5 WBC/hpf   RBC / HPF 0-5 0 - 5 RBC/hpf   Bacteria, UA NONE SEEN NONE SEEN      Assessment & Plan:   Problem List Items Addressed This Visit       Other   Depression, recurrent ([place]) - Primary    Not under good control. Will continue celexa and add  wellbutrin. Recheck in 3-4 weeks. Call with any concerns.       Relevant Medications   citalopram (CELEXA) 20 MG tablet   buPROPion (WELLBUTRIN SR) 150 MG 12 hr tablet   Abdominal pain, epigastric    Likely due to alcohol use. Will increase her protonix for 1 month and start carafate. Cut down  on alcohol. Call with any concerns.       Alcohol abuse    Encouraged to cut down. Information about counselors given today. Will start wellbutrin to help with cravings. Call with any concerns.       Other Visit Diagnoses     Need for Tdap vaccination       Relevant Orders   Tdap vaccine greater than or equal to 7yo IM (Completed)        Follow up plan: Return 3-4 weeks.

## 2020-12-31 NOTE — Assessment & Plan Note (Signed)
Encouraged to cut down. Information about counselors given today. Will start wellbutrin to help with cravings. Call with any concerns.

## 2020-12-31 NOTE — Assessment & Plan Note (Signed)
Not under good control. Will continue celexa and add wellbutrin. Recheck in 3-4 weeks. Call with any concerns.

## 2020-12-31 NOTE — Telephone Encounter (Signed)
Requested medication (s) are due for refill today:   Prescribed for first time today  Requested medication (s) are on the active medication list:   Yes  Future visit scheduled:   Yes   Last ordered: Today   Returned because pt is requesting a 90 day supply.  Provider to review   Requested Prescriptions  Pending Prescriptions Disp Refills   sucralfate (CARAFATE) 1 g tablet [Pharmacy Med Name: SUCRALFATE 1GM TABLETS] 360 tablet     Sig: TAKE 1 TABLET(1 GRAM) BY MOUTH FOUR TIMES DAILY WITH MEALS AND AT BEDTIME     Gastroenterology: Antiacids Passed - 12/31/2020 11:56 AM      Passed - Valid encounter within last 12 months    Recent Outpatient Visits           Today Depression, recurrent [facility])   [place], Megan P, DO   8 months ago [place], [place], DO   1 year ago Pelvic cramping   [place], DO   1 year ago Tachycardia   [place], [place], DO   2 years ago Peripheral edema   [facility] Valerie Roys, DO       Future Appointments             In 1 month Johnson, Barb Merino, DO [employer]

## 2020-12-31 NOTE — Assessment & Plan Note (Signed)
Likely due to alcohol use. Will increase her protonix for 1 month and start carafate. Cut down on alcohol. Call with any concerns.

## 2021-01-18 ENCOUNTER — Ambulatory Visit
Admission: RE | Admit: 2021-01-18 | Discharge: 2021-01-18 | Disposition: A | Discharge status: Home / Self Care | Payer: Medicaid Other | Source: Ambulatory Visit | Attending: Psychiatry | Admitting: Psychiatry

## 2021-01-18 ENCOUNTER — Other Ambulatory Visit: Payer: Self-pay

## 2021-01-18 DIAGNOSIS — Z452 Encounter for adjustment and management of vascular access device: Secondary | ICD-10-CM | POA: Insufficient documentation

## 2021-01-18 MED ORDER — HEPARIN SOD (PORK) LOCK FLUSH 100 UNIT/ML IV SOLN
INTRAVENOUS | Status: AC
  Administered 2021-01-18: 500 [IU]
  Filled 2021-01-18: qty 5

## 2021-01-18 MED ORDER — HEPARIN SOD (PORK) LOCK FLUSH 100 UNIT/ML IV SOLN: 500.0000 [IU] | INTRAVENOUS | Status: AC | PRN

## 2021-01-18 MED ORDER — HEPARIN SOD (PORK) LOCK FLUSH 100 UNIT/ML IV SOLN: 500.0000 [IU] | Freq: Once | INTRAVENOUS | Status: DC

## 2021-01-21 ENCOUNTER — Ambulatory Visit: Payer: Medicaid Other | Admitting: Family Medicine

## 2021-01-21 NOTE — Progress Notes (Signed)
Hi Katie Stanley.  I am not inpatient. I do not know how to do this. 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

## 2021-02-01 ENCOUNTER — Ambulatory Visit: Payer: Medicaid Other | Admitting: Family Medicine

## 2021-02-02 ENCOUNTER — Ambulatory Visit: Payer: Medicaid Other | Admitting: Family Medicine

## 2021-02-17 ENCOUNTER — Other Ambulatory Visit: Payer: Self-pay

## 2021-02-17 ENCOUNTER — Emergency Department
Admission: EM | Admit: 2021-02-17 | Discharge: 2021-02-17 | Disposition: A | Discharge status: Home / Self Care | Payer: Medicaid Other | Attending: Emergency Medicine | Admitting: Emergency Medicine

## 2021-02-17 ENCOUNTER — Encounter: Payer: Self-pay | Admitting: Emergency Medicine

## 2021-02-17 ENCOUNTER — Emergency Department: Payer: Medicaid Other

## 2021-02-17 DIAGNOSIS — Z20822 Contact with and (suspected) exposure to covid-19: Secondary | ICD-10-CM | POA: Insufficient documentation

## 2021-02-17 DIAGNOSIS — J069 Acute upper respiratory infection, unspecified: Secondary | ICD-10-CM | POA: Diagnosis not present

## 2021-02-17 DIAGNOSIS — R509 Fever, unspecified: Secondary | ICD-10-CM | POA: Diagnosis not present

## 2021-02-17 DIAGNOSIS — M791 Myalgia, unspecified site: Secondary | ICD-10-CM | POA: Diagnosis present

## 2021-02-17 DIAGNOSIS — F17210 Nicotine dependence, cigarettes, uncomplicated: Secondary | ICD-10-CM | POA: Insufficient documentation

## 2021-02-17 DIAGNOSIS — R059 Cough, unspecified: Secondary | ICD-10-CM | POA: Diagnosis not present

## 2021-02-17 DIAGNOSIS — B9789 Other viral agents as the cause of diseases classified elsewhere: Secondary | ICD-10-CM | POA: Diagnosis not present

## 2021-02-17 LAB — RESP PANEL BY RT-PCR (FLU A&B, COVID) ARPGX2
Influenza A by PCR: POSITIVE — AB
Influenza B by PCR: NEGATIVE
SARS Coronavirus 2 by RT PCR: NEGATIVE

## 2021-02-17 MED ORDER — ALBUTEROL SULFATE HFA 108 (90 BASE) MCG/ACT IN AERS: 2.0000 | INHALATION_SPRAY | Freq: Four times a day (QID) | RESPIRATORY_TRACT | 2 refills | Status: DC | PRN

## 2021-02-17 MED ORDER — PREDNISONE 50 MG PO TABS
50.0000 mg | ORAL_TABLET | Freq: Every day | ORAL | 0 refills | Status: DC
Start: 2021-02-17 — End: 2021-05-16

## 2021-02-17 NOTE — ED Triage Notes (Signed)
Pt comes into the ED via POV c/o cough, low grade fever, and body aches.  Pt states symptoms started on Monday.  Pt ambulatory with even and unlabored respirations.  Pt in NAD.

## 2021-02-17 NOTE — ED Provider Notes (Signed)
[facility] Emergency Department Provider Note   ____________________________________________    I have reviewed the triage vital signs and the nursing notes.   HISTORY  Chief Complaint Generalized Body Aches and Cough     HPI Katie Stanley is a 35 y.o. female who comes in with body aches, cough, fatigue, low-grade fever.  Patient reports symptom started yesterday.  She denies sick contacts.  Has been vaccinated against COVID-19.  Has not take anything for this.  No shortness of breath.  No calf pain or swelling  Past Medical History:  Diagnosis Date   Anxiety    Biliary calculi 12/07/2009   Cholelithiasis    Depression    GERD (gastroesophageal reflux disease)    Hepatitis C    body "self healed" per patient   Palpitations    Retained intrauterine contraceptive device (IUD) 01/20/2020   Substance abuse ([place])     Patient Active Problem List   Diagnosis Date Noted   Alcohol abuse 12/31/2020   Paroxysmal tachycardia ([place]) 07/22/2019   Leg edema 07/22/2019   Morbid obesity ([place]) 04/29/2018   Abdominal pain, epigastric    Acute gastritis without hemorrhage    Reflux esophagitis    Gastroesophageal reflux disease 12/25/2017   Chronic bilateral thoracic back pain 06/15/2016   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[place]) 05/01/2016   Anxiety 02/25/2015   Depression, recurrent ([place]) 02/25/2015   Chronic hepatitis C virus infection ([place]) 07/07/2009    Past Surgical History:  Procedure Laterality Date   CHOLECYSTECTOMY     2011   ESOPHAGOGASTRODUODENOSCOPY (EGD) WITH PROPOFOL N/A 01/21/2018   Procedure: ESOPHAGOGASTRODUODENOSCOPY (EGD) WITH Biopsy;  Surgeon: Lucilla Lame, MD;  Location: [place];  Service: Endoscopy;  Laterality: N/A;   HYSTEROSCOPY WITH D & C N/A 01/20/2020   Procedure: DILATATION AND CURETTAGE /HYSTEROSCOPY;  Surgeon: Will Bonnet, MD;  Location: [facility];  Service: Gynecology;  Laterality: N/A;    INTRAUTERINE DEVICE (IUD) INSERTION N/A 01/20/2020   Procedure: INTRAUTERINE DEVICE (IUD) INSERTION MIRENA IUD;  Surgeon: Will Bonnet, MD;  Location: [facility];  Service: Gynecology;  Laterality: N/A;   IUD REMOVAL N/A 01/20/2020   Procedure: INTRAUTERINE DEVICE (IUD) REMOVAL;  Surgeon: Will Bonnet, MD;  Location: [facility];  Service: Gynecology;  Laterality: N/A;   PORTA CATH INSERTION N/A 01/01/2019   Procedure: PORTA CATH INSERTION;  Surgeon: Algernon Huxley, MD;  Location: [facility];  Service: Cardiovascular;  Laterality: N/A;    Prior to Admission medications   Medication Sig Start Date End Date Taking? Authorizing Provider  albuterol (VENTOLIN HFA) 108 (90 Base) MCG/ACT inhaler Inhale 2 puffs into the lungs every 6 (six) hours as needed for wheezing or shortness of breath. 02/17/21  Yes Lavonia Drafts, MD  predniSONE (DELTASONE) 50 MG tablet Take 1 tablet (50 mg total) by mouth daily with breakfast. 02/17/21  Yes Lavonia Drafts, MD  buPROPion [facility] SR) 150 MG 12 hr tablet Take 1 tablet (150 mg total) by mouth 2 (two) times daily. 12/31/20   Johnson, Megan P, DO  citalopram (CELEXA) 20 MG tablet Take 1.5 tablets (30 mg total) by mouth daily. 12/31/20   Johnson, Megan P, DO  ibuprofen (ADVIL) 600 MG tablet Take 1 tablet (600 mg total) by mouth every 6 (six) hours as needed for mild pain or cramping. 01/20/20   Will Bonnet, MD  metoprolol tartrate (LOPRESSOR) 25 MG tablet TAKE 1 TABLET(25 MG) BY MOUTH TWICE DAILY AS NEEDED  Patient not taking: Reported on 12/31/2020 06/03/20   Minna Merritts, MD  Naphazoline HCl (CLEAR EYES OP) Place 1 drop into both eyes daily as needed (itching).    [provider]  pantoprazole (PROTONIX) 40 MG tablet TAKE 1 TABLET(40 MG) BY MOUTH DAILY 12/31/20   Johnson, Megan P, DO  SUBOXONE 8-2 MG FILM Place 1 Film under the tongue 2 (two) times daily.  12/13/17   [provider]  sucralfate (CARAFATE) 1 g tablet Take 1  tablet (1 g total) by mouth 4 (four) times daily -  with meals and at bedtime. 12/31/20   [place] Liter P, DO     Allergies Penicillins  Family History  Problem Relation Age of Onset   Hypertension Mother    Diabetes Father    Hypertension Father    Liver cancer Maternal Grandmother    Hypertension Maternal Grandfather    Breast cancer Paternal Grandmother    Heart disease Paternal Grandfather     Social History Social History   Tobacco Use   Smoking status: Every Day    Packs/day: 1.00    Years: 12.00    Pack years: 12.00    Types: Cigarettes   Smokeless tobacco: Never  Vaping Use   Vaping Use: Never used  Substance Use Topics   Alcohol use: Yes    Comment: 6 BEERS SOME DAY, 20 shots per day   Drug use: Yes    Types: Marijuana    Review of Systems  Constitutional: As above  ENT: No sore throat. Respiratory: Positive cough  Gastrointestinal: No abdominal pain.  No nausea, no vomiting.   Genitourinary: Negative for dysuria. Musculoskeletal: Chronic back pain Skin: Negative for rash. Neurological: Negative for headaches     ____________________________________________   PHYSICAL EXAM:  VITAL SIGNS: ED Triage Vitals  Enc Vitals Group     BP 02/17/21 1145 (!) 147/61     Pulse Rate 02/17/21 1145 79     Resp 02/17/21 1145 18     Temp 02/17/21 1145 100 F (37.8 C)     Temp Source 02/17/21 1145 Oral     SpO2 02/17/21 1145 98 %     Weight 02/17/21 1044 98.5 kg (217 lb 2.5 oz)     Height 02/17/21 1044 1.6 m (5\' 3" )     Head Circumference --      Peak Flow --      Pain Score 02/17/21 1044 9     Pain Loc --      Pain Edu? --      Excl. in [place]? --      Constitutional: Alert and oriented. No acute distress. Pleasant and interactive Eyes: Conjunctivae are normal.   Nose: No congestion/rhinnorhea. Mouth/Throat: Mucous membranes are moist.   Cardiovascular: Normal rate, regular rhythm.  Respiratory: Normal respiratory effort.  No retractions.  Positive  cough, scattered wheezes  Musculoskeletal: No lower extremity tenderness nor edema.   Neurologic:  Normal speech and language. No gross focal neurologic deficits are appreciated.   Skin:  Skin is warm, dry and intact. No rash noted.   ____________________________________________   LABS (all labs ordered are listed, but only abnormal results are displayed)  Labs Reviewed  RESP PANEL BY RT-PCR (FLU A&B, COVID) ARPGX2 - Abnormal; Notable for the following components:      Result Value   Influenza A by PCR POSITIVE (*)    All other components within normal limits   ____________________________________________  EKG   ____________________________________________  RADIOLOGY Chest  x-ray reviewed by me, no evidence of pneumonia  ____________________________________________   PROCEDURES  Procedure(s) performed: No  Procedures   Critical Care performed: No ____________________________________________   INITIAL IMPRESSION / ASSESSMENT AND PLAN / ED COURSE  Pertinent labs & imaging results that were available during my care of the patient were reviewed by me and considered in my medical decision making (see chart for details).   Patient presents with cough, low-grade fever, fatigue suspicious for viral upper respiratory illness likely influenza versus COVID.  Bacterial pneumonia is a possibility, sent for x-ray which is reassuring.  PCR returned positive for flu, recommend supportive care, will Rx prednisone given wheezing.   ____________________________________________   FINAL CLINICAL IMPRESSION(S) / ED DIAGNOSES  Final diagnoses:  Viral upper respiratory tract infection      NEW MEDICATIONS STARTED DURING THIS VISIT:  Discharge Medication List as of 02/17/2021 12:32 PM     START taking these medications   Details  predniSONE (DELTASONE) 50 MG tablet Take 1 tablet (50 mg total) by mouth daily with breakfast., Starting Thu 02/17/2021, Normal          Note:  This document was prepared using Dragon voice recognition software and may include unintentional dictation errors.    Lavonia Drafts, MD 02/17/21 1332

## 2021-02-28 ENCOUNTER — Other Ambulatory Visit: Payer: Self-pay | Admitting: Gastroenterology

## 2021-03-02 ENCOUNTER — Encounter: Payer: Self-pay | Admitting: Family Medicine

## 2021-03-02 ENCOUNTER — Ambulatory Visit
Admission: RE | Admit: 2021-03-02 | Discharge: 2021-03-02 | Disposition: A | Discharge status: Home / Self Care | Payer: Medicaid Other | Source: Ambulatory Visit | Attending: Psychiatry | Admitting: Psychiatry

## 2021-03-02 VITALS — BP 144/93 | HR 90 | Temp 97.7°F | Resp 18 | Ht 63.0 in | Wt 210.0 lb

## 2021-03-02 DIAGNOSIS — B182 Chronic viral hepatitis C: Secondary | ICD-10-CM

## 2021-03-02 DIAGNOSIS — Z452 Encounter for adjustment and management of vascular access device: Secondary | ICD-10-CM | POA: Diagnosis present

## 2021-03-02 DIAGNOSIS — M546 Pain in thoracic spine: Secondary | ICD-10-CM | POA: Insufficient documentation

## 2021-03-02 DIAGNOSIS — G8929 Other chronic pain: Secondary | ICD-10-CM | POA: Diagnosis not present

## 2021-03-02 DIAGNOSIS — K2900 Acute gastritis without bleeding: Secondary | ICD-10-CM

## 2021-03-02 DIAGNOSIS — Z0000 Encounter for general adult medical examination without abnormal findings: Secondary | ICD-10-CM

## 2021-03-02 LAB — LIPID PANEL
Cholesterol: 215 mg/dL — ABNORMAL HIGH (ref 0–200)
HDL: 50 mg/dL (ref >40)
LDL Cholesterol: 91 mg/dL (ref 0–99)
Total CHOL/HDL Ratio: 4.3 RATIO
Triglycerides: 370 mg/dL — ABNORMAL HIGH (ref <150)
VLDL: 74 mg/dL — ABNORMAL HIGH (ref 0–40)

## 2021-03-02 LAB — URINALYSIS, ROUTINE W REFLEX MICROSCOPIC
Bilirubin Urine: NEGATIVE
Glucose, UA: NEGATIVE mg/dL
Hgb urine dipstick: NEGATIVE
Ketones, ur: NEGATIVE mg/dL
Leukocytes,Ua: NEGATIVE
Nitrite: NEGATIVE
Protein, ur: NEGATIVE mg/dL
Specific Gravity, Urine: 1.008 (ref 1.005–1.030)
pH: 7 (ref 5.0–8.0)

## 2021-03-02 LAB — CBC WITH DIFFERENTIAL/PLATELET
Abs Immature Granulocytes: 0.04 10*3/uL (ref 0.00–0.07)
Basophils Absolute: 0.1 10*3/uL (ref 0.0–0.1)
Basophils Relative: 1 %
Eosinophils Absolute: 0.1 10*3/uL (ref 0.0–0.5)
Eosinophils Relative: 1 %
HCT: 42.9 % (ref 36.0–46.0)
Hemoglobin: 15 g/dL (ref 12.0–15.0)
Immature Granulocytes: 0 %
Lymphocytes Relative: 21 %
Lymphs Abs: 2.2 10*3/uL (ref 0.7–4.0)
MCH: 32.9 pg (ref 26.0–34.0)
MCHC: 35 g/dL (ref 30.0–36.0)
MCV: 94.1 fL (ref 80.0–100.0)
Monocytes Absolute: 0.6 10*3/uL (ref 0.1–1.0)
Monocytes Relative: 6 %
Neutro Abs: 7.2 10*3/uL (ref 1.7–7.7)
Neutrophils Relative %: 71 %
Platelets: 244 10*3/uL (ref 150–400)
RBC: 4.56 MIL/uL (ref 3.87–5.11)
RDW: 14.4 % (ref 11.5–15.5)
WBC: 10.2 10*3/uL (ref 4.0–10.5)
nRBC: 0 % (ref 0.0–0.2)

## 2021-03-02 LAB — IRON AND TIBC
Iron: 143 ug/dL (ref 28–170)
Saturation Ratios: 34 % — ABNORMAL HIGH (ref 10.4–31.8)
TIBC: 416 ug/dL (ref 250–450)
UIBC: 273 ug/dL

## 2021-03-02 LAB — COMPREHENSIVE METABOLIC PANEL
ALT: 52 U/L — ABNORMAL HIGH (ref 0–44)
AST: 80 U/L — ABNORMAL HIGH (ref 15–41)
Albumin: 3.7 g/dL (ref 3.5–5.0)
Alkaline Phosphatase: 88 U/L (ref 38–126)
Anion gap: 11 (ref 5–15)
BUN: 5 mg/dL — ABNORMAL LOW (ref 6–20)
CO2: 30 mmol/L (ref 22–32)
Calcium: 9 mg/dL (ref 8.9–10.3)
Chloride: 96 mmol/L — ABNORMAL LOW (ref 98–111)
Creatinine, Ser: 0.59 mg/dL (ref 0.44–1.00)
GFR, Estimated: >60 mL/min (ref >60)
Glucose, Bld: 92 mg/dL (ref 70–99)
Potassium: 3.2 mmol/L — ABNORMAL LOW (ref 3.5–5.1)
Sodium: 137 mmol/L (ref 135–145)
Total Bilirubin: 0.9 mg/dL (ref 0.3–1.2)
Total Protein: 7 g/dL (ref 6.5–8.1)

## 2021-03-02 LAB — TSH: TSH: 1.8 u[IU]/mL (ref 0.350–4.500)

## 2021-03-02 LAB — FERRITIN: Ferritin: 83 ng/mL (ref 11–307)

## 2021-03-02 MED ORDER — HEPARIN SOD (PORK) LOCK FLUSH 100 UNIT/ML IV SOLN
INTRAVENOUS | Status: AC
  Administered 2021-03-02: 500 [IU]
  Filled 2021-03-02: qty 5

## 2021-03-02 MED ORDER — HEPARIN SOD (PORK) LOCK FLUSH 100 UNIT/ML IV SOLN: 500.0000 [IU] | INTRAVENOUS | Status: AC | PRN

## 2021-03-02 MED ORDER — SODIUM CHLORIDE FLUSH 0.9 % IV SOLN
INTRAVENOUS | Status: AC
  Administered 2021-03-02: 10 mL via INTRAVENOUS
  Filled 2021-03-02: qty 20

## 2021-03-02 MED ORDER — SODIUM CHLORIDE 0.9% FLUSH: 10.0000 mL | Freq: Once | INTRAVENOUS | Status: AC

## 2021-03-02 NOTE — Progress Notes (Signed)
Pt's port was accessed and deaccessed, flused and heparin locked. Labs were drawn from port and sent to lab with out difficulties. Urine sample was also collected and sent to lab. Appointment was made for next 6 weeks. Continue to monitor

## 2021-03-03 LAB — HCV RNA QUANT: HCV Quantitative: NOT DETECTED IU/mL (ref >50)

## 2021-03-07 IMAGING — US US EXTREM LOW VENOUS*L*
1 series · 13 of 24 positions shown · non-contrast
Comparison: None.

CLINICAL DATA: 33-year-old female with left lower extremity pain
and swelling



[Series 1: us extrem low venous*left* · 0.08mm/px · 13 of 37 slices shown]
[im 1/37]
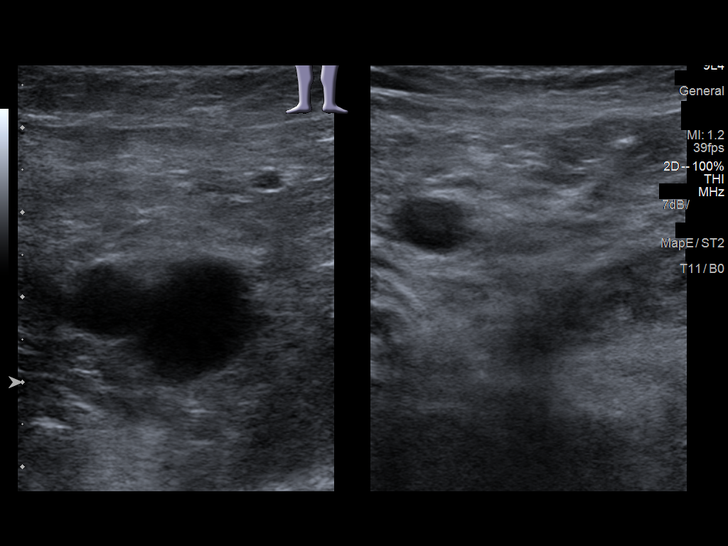
[im 4/37]
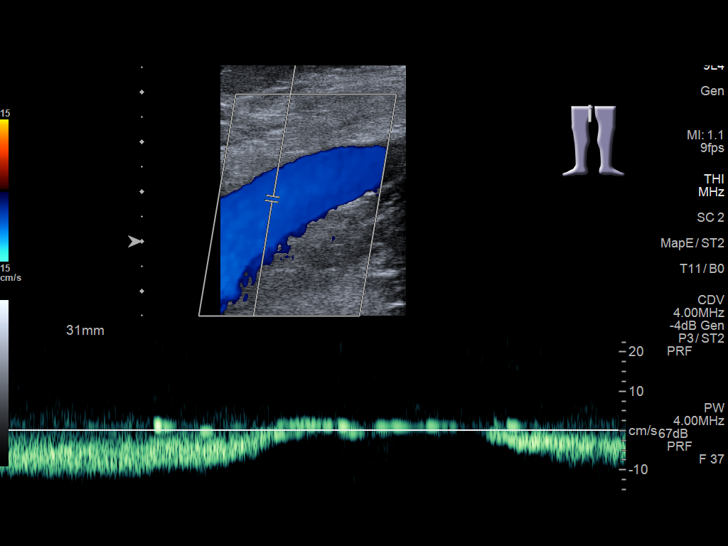
[im 7/37]
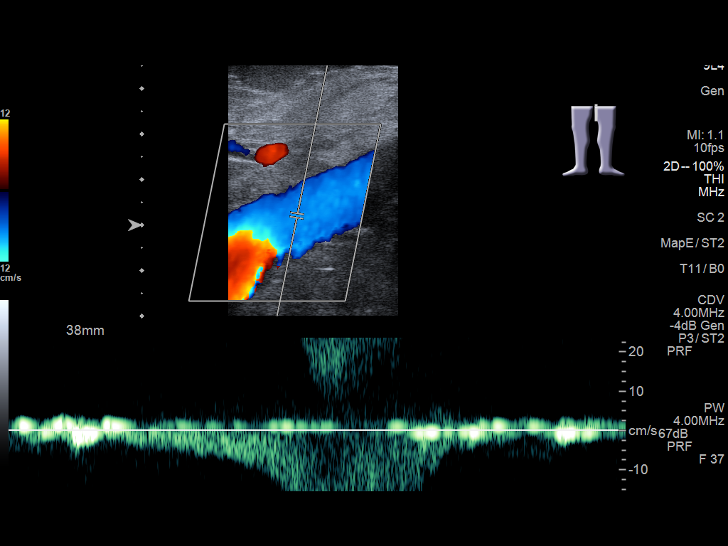
[im 10/37]
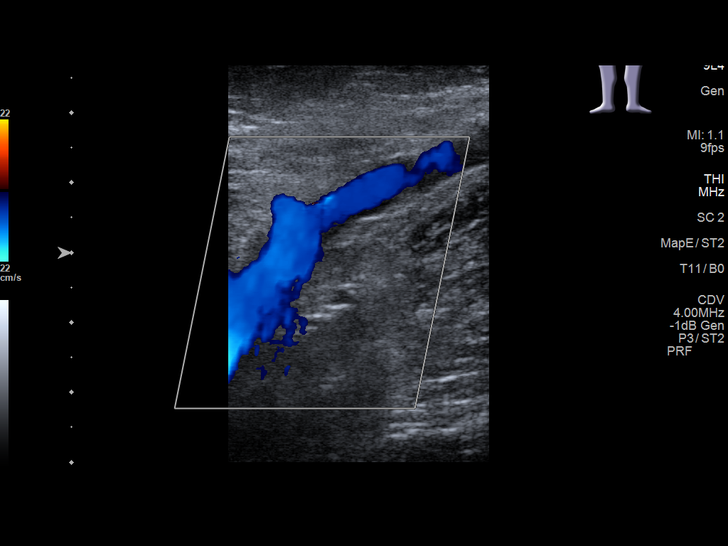
[im 13/37]
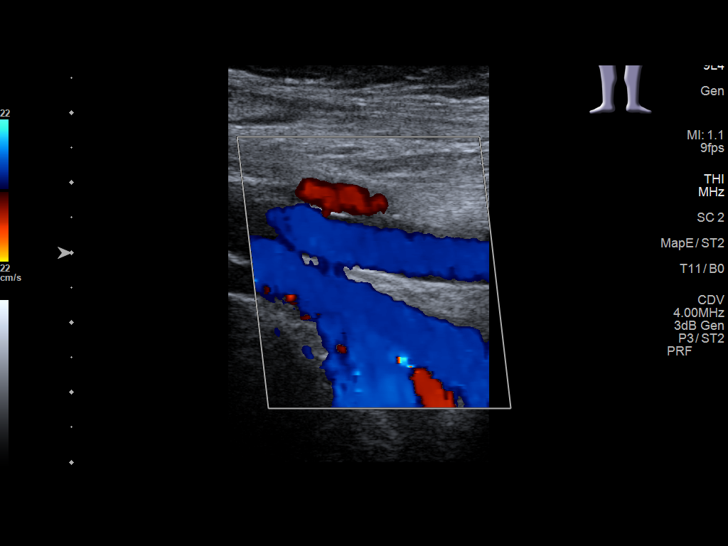
[im 16/37]
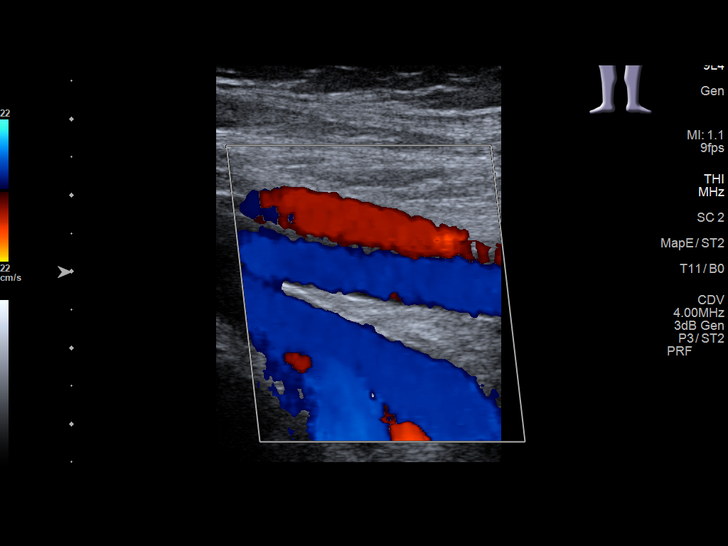
[im 19/37]
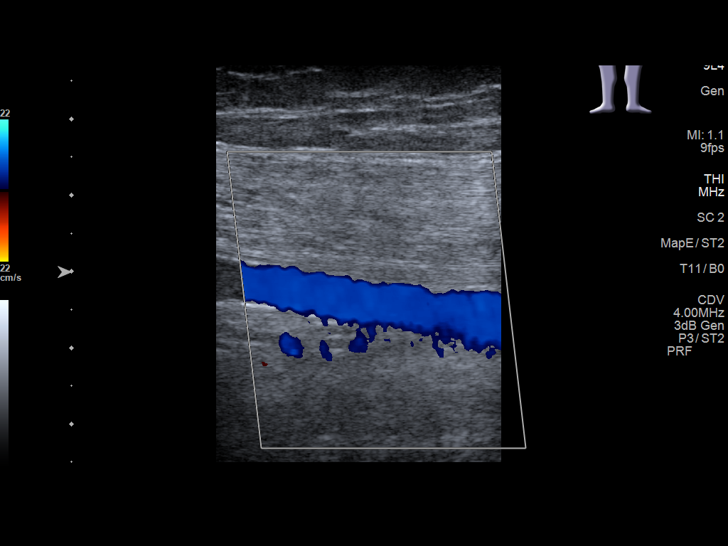
[im 21/37]
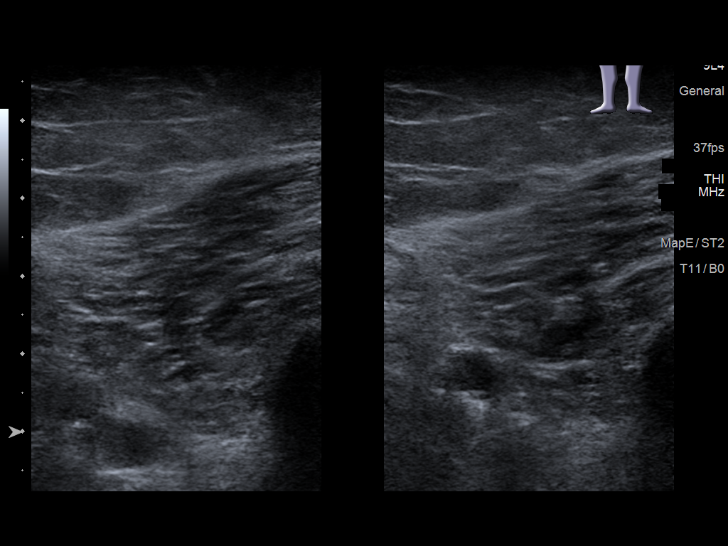
[im 24/37]
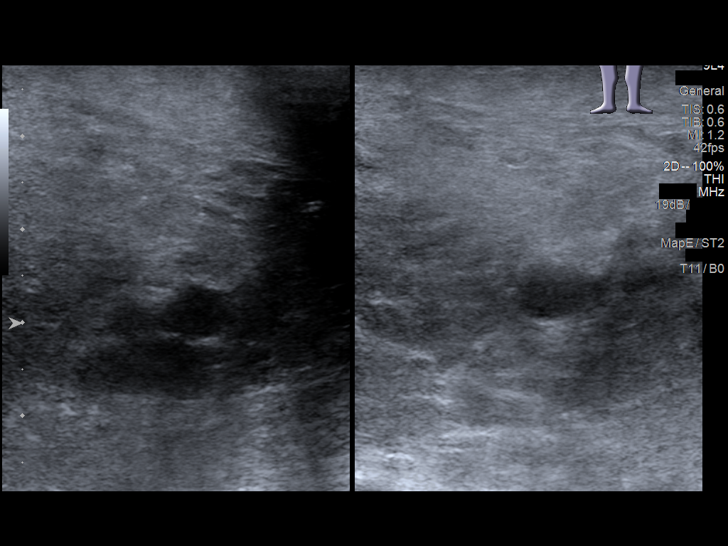
[im 27/37]
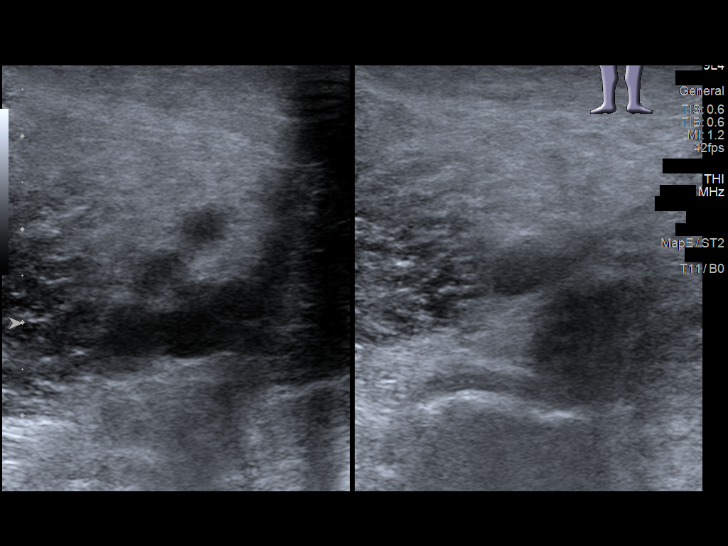
[im 30/37]
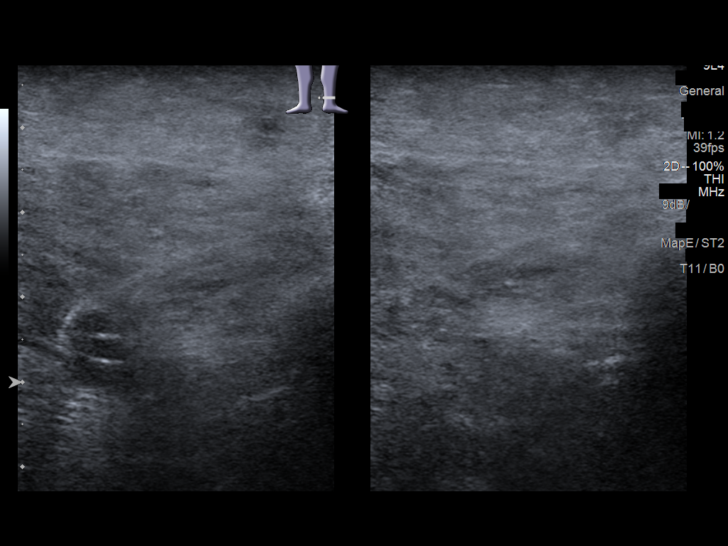
[im 33/37]
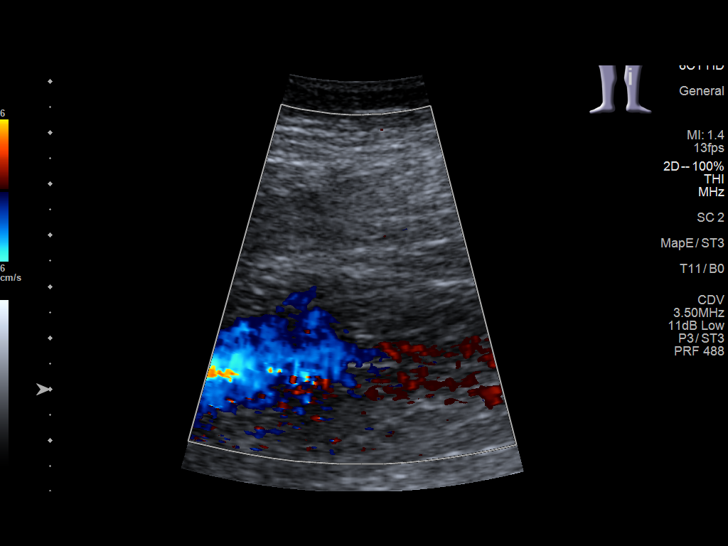
[im 37/37]
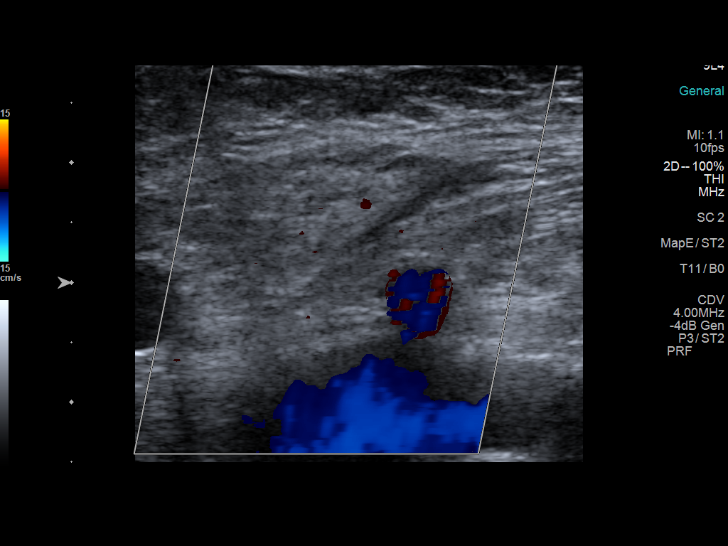

[13 of 24 positions shown; findings below may reference images not displayed]

FINDINGS: Contralateral Common Femoral Vein: Respiratory phasicity is normal
and symmetric with the symptomatic side. No evidence of thrombus.
Normal compressibility.

Common Femoral Vein: No evidence of thrombus. Normal
compressibility, respiratory phasicity and response to augmentation.

Saphenofemoral Junction: No evidence of thrombus. Normal
compressibility and flow on color Doppler imaging.

Profunda Femoral Vein: No evidence of thrombus. Normal
compressibility and flow on color Doppler imaging.

Femoral Vein: No evidence of thrombus. Normal compressibility,
respiratory phasicity and response to augmentation.

Popliteal Vein: No evidence of thrombus. Normal compressibility,
respiratory phasicity and response to augmentation.

Calf Veins: No evidence of thrombus. Normal compressibility and flow
on color Doppler imaging.

Superficial Great Saphenous Vein: No evidence of thrombus. Normal
compressibility.

Venous Reflux:  None.

Other Findings:  None.
IMPRESSION: No evidence of deep venous thrombosis.

## 2021-04-13 ENCOUNTER — Ambulatory Visit: Admission: RE | Admit: 2021-04-13 | Payer: Medicaid Other | Source: Ambulatory Visit

## 2021-04-13 NOTE — Progress Notes (Addendum)
Reached out to patient regarding her scheduled injection appt today at same day but patient unable to make it today so appointment re-scheduled for Jan. 5,2023

## 2021-04-14 ENCOUNTER — Ambulatory Visit: Payer: Medicaid Other

## 2021-04-15 ENCOUNTER — Telehealth: Payer: Self-pay | Admitting: Family Medicine

## 2021-04-15 ENCOUNTER — Ambulatory Visit
Admission: RE | Admit: 2021-04-15 | Discharge: 2021-04-15 | Disposition: A | Discharge status: Home / Self Care | Payer: Medicaid Other | Source: Ambulatory Visit | Attending: Family Medicine | Admitting: Family Medicine

## 2021-04-15 DIAGNOSIS — Z452 Encounter for adjustment and management of vascular access device: Secondary | ICD-10-CM | POA: Diagnosis not present

## 2021-04-15 MED ORDER — HEPARIN SOD (PORK) LOCK FLUSH 100 UNIT/ML IV SOLN: 500.0000 [IU] | INTRAVENOUS | Status: DC | PRN

## 2021-04-15 MED ORDER — HEPARIN SOD (PORK) LOCK FLUSH 100 UNIT/ML IV SOLN
INTRAVENOUS | Status: AC
  Administered 2021-04-15: 500 [IU]
  Filled 2021-04-15: qty 5

## 2021-04-15 MED ORDER — HEPARIN SOD (PORK) LOCK FLUSH 100 UNIT/ML IV SOLN: 500.0000 [IU] | INTRAVENOUS | Status: DC

## 2021-04-15 NOTE — Telephone Encounter (Signed)
Needs repeat order for port flushed faxed to [phone]

## 2021-04-19 NOTE — Telephone Encounter (Signed)
Signed off.

## 2021-05-11 ENCOUNTER — Ambulatory Visit: Payer: Self-pay | Admitting: *Deleted

## 2021-05-11 NOTE — Telephone Encounter (Signed)
I returned pt's call.   She called in c/o her throat having swelling that is noticeable and getting worse over the last 3 months.  She thinks it could be her thyroid.  I have swelling over my thyroid area.   Sometimes it's difficult to swallow.   My eyes are bulging.   It was recommended I get my thyroid checked.  I need an appt.    Reason for Disposition  [1] Face swelling is a chronic problem (recurrent or ongoing AND present > 4 weeks) AND [2] cause unknown  Answer Assessment - Initial Assessment Questions 1. ONSET: "When did the swelling start?" (e.g., minutes, hours, days)     I'm having swelling that is noticeable and getting worse over the last 3 months over my thyroid area.   Her eyes also look like they are bulging.   It was suggested she get her thyroid checked. 2. LOCATION: "What part of the face is swollen?"     Area over thyroid, front of neck 3. SEVERITY: "How swollen is it?"     Getting worse over the last 3 months 4. ITCHING: "Is there any itching?" If Yes, ask: "How much?"   (Scale 1-10; mild, moderate or severe)     *No Answer* 5. PAIN: "Is the swelling painful to touch?" If Yes, ask: "How painful is it?"   (Scale 1-10; mild, moderate or severe)   - NONE (0): no pain   - MILD (1-3): doesn't interfere with normal activities    - MODERATE (4-7): interferes with normal activities or awakens from sleep    - SEVERE (8-10): excruciating pain, unable to do any normal activities      *No Answer* 6. FEVER: "Do you have a fever?" If Yes, ask: "What is it, how was it measured, and when did it start?"      *No Answer* 7. CAUSE: "What do you think is causing the face swelling?"     *No Answer* 8. RECURRENT SYMPTOM: "Have you had face swelling before?" If Yes, ask: "When was the last time?" "What happened that time?"     *No Answer* 9. OTHER SYMPTOMS: "Do you have any other symptoms?" (e.g., toothache, leg swelling)     *No Answer* 10. PREGNANCY: "Is there any chance you are  pregnant?" "When was your last menstrual period?"       *No Answer*  Protocols used: Face Swelling-A-AH

## 2021-05-11 NOTE — Telephone Encounter (Signed)
°  Chief Complaint: Swelling over thyroid area that is getting worse over the last 3 months.   Eyes are bulging. Symptoms: Sometimes it's hard to swallow. Frequency: Over the last 3 months the swelling is getting worse Pertinent Negatives: Patient denies being sick with URI Disposition: [] ED /[] Urgent Care (no appt availability in office) / [x] Appointment(In office/virtual)/ []  [facility]/ [] Home Care/ [] Refused Recommended Disposition /[]  Mobile Bus/ []  Follow-up with PCP Additional Notes: Appt made for 05/16/2021 at 3:00 with Dr. Park Liter.

## 2021-05-16 ENCOUNTER — Encounter: Payer: Self-pay | Admitting: Family Medicine

## 2021-05-16 ENCOUNTER — Ambulatory Visit (INDEPENDENT_AMBULATORY_CARE_PROVIDER_SITE_OTHER): Payer: Medicaid Other | Admitting: Family Medicine

## 2021-05-16 ENCOUNTER — Other Ambulatory Visit: Payer: Self-pay

## 2021-05-16 VITALS — BP 152/96 | HR 101 | Temp 99.2°F | Wt 217.0 lb

## 2021-05-16 DIAGNOSIS — F339 Major depressive disorder, recurrent, unspecified: Secondary | ICD-10-CM | POA: Diagnosis not present

## 2021-05-16 DIAGNOSIS — R221 Localized swelling, mass and lump, neck: Secondary | ICD-10-CM | POA: Diagnosis not present

## 2021-05-16 DIAGNOSIS — I10 Essential (primary) hypertension: Secondary | ICD-10-CM | POA: Diagnosis not present

## 2021-05-16 DIAGNOSIS — I479 Paroxysmal tachycardia, unspecified: Secondary | ICD-10-CM | POA: Diagnosis not present

## 2021-05-16 DIAGNOSIS — Z95828 Presence of other vascular implants and grafts: Secondary | ICD-10-CM | POA: Diagnosis not present

## 2021-05-16 DIAGNOSIS — R82998 Other abnormal findings in urine: Secondary | ICD-10-CM | POA: Diagnosis not present

## 2021-05-16 DIAGNOSIS — K2900 Acute gastritis without bleeding: Secondary | ICD-10-CM

## 2021-05-16 DIAGNOSIS — K2100 Gastro-esophageal reflux disease with esophagitis, without bleeding: Secondary | ICD-10-CM | POA: Diagnosis not present

## 2021-05-16 LAB — URINALYSIS, ROUTINE W REFLEX MICROSCOPIC
Bilirubin, UA: NEGATIVE
Glucose, UA: NEGATIVE
Ketones, UA: NEGATIVE
Leukocytes,UA: NEGATIVE
Nitrite, UA: NEGATIVE
RBC, UA: NEGATIVE
Specific Gravity, UA: 1.025 (ref 1.005–1.030)
Urobilinogen, Ur: 1 mg/dL (ref 0.2–1.0)
pH, UA: 7 (ref 5.0–7.5)

## 2021-05-16 LAB — MICROALBUMIN, URINE WAIVED
Creatinine, Urine Waived: 200 mg/dL (ref 10–300)
Microalb, Ur Waived: 80 mg/L — ABNORMAL HIGH (ref 0–19)
Microalb/Creat Ratio: <30 mg/g (ref <30)

## 2021-05-16 LAB — MICROSCOPIC EXAMINATION
Bacteria, UA: NONE SEEN
WBC, UA: NONE SEEN /hpf (ref 0–5)

## 2021-05-16 MED ORDER — METOPROLOL TARTRATE 25 MG PO TABS: ORAL_TABLET | ORAL | 1 refills | Status: DC

## 2021-05-16 MED ORDER — CITALOPRAM HYDROBROMIDE 20 MG PO TABS: 20.0000 mg | ORAL_TABLET | Freq: Every day | ORAL | 1 refills | Status: DC

## 2021-05-16 NOTE — Assessment & Plan Note (Signed)
Getting worse. Needs to get back in to see GI. Referral generated today. Follow up with them.

## 2021-05-16 NOTE — Assessment & Plan Note (Signed)
Not doing well. Will restart metoprolol. Recheck 2-4 weeks.

## 2021-05-16 NOTE — Progress Notes (Signed)
BP (!) 152/96    Pulse (!) 101    Temp 99.2 F (37.3 C)    Wt 217 lb (98.4 kg)    SpO2 98%    BMI 38.44 kg/m    Subjective:    Patient ID: Katie Stanley, female    DOB: 1985/12/29, 36 y.o.   MRN: [id number]  HPI: Katie Stanley is a 36 y.o. female  Chief Complaint  Patient presents with   swelling in neck    Patient states she has swelling in her neck for a few months but has gotten worse in the past two months. Patient states she has a port in her neck and she feels like it gets stuck until she moves her neck around.   She notes that she has been having some swelling in the anterior portion of her neck for about 3-4 months. She denies any pain, but does feel like things are swollen. She notes that her Mom has noticed it and has asked what's going on. She is concerned about it.   HYPERTENSION Hypertension status: uncontrolled  Satisfied with current treatment? no Duration of hypertension: chronic BP monitoring frequency:  not checking BP range:  BP medication side effects:  no Medication compliance: poor compliance Previous BP meds:metoprolol Aspirin: no Recurrent headaches: no Visual changes: no Palpitations: yes Dyspnea: no Chest pain: no Lower extremity edema: no Dizzy/lightheaded: no  GERD GERD control status: uncontrolled Satisfied with current treatment? no Heartburn frequency: constant Medication side effects: no  Medication compliance: worse Dysphagia: no Odynophagia:  no Hematemesis: no Blood in stool: no EGD: no   Relevant past medical, surgical, family and social history reviewed and updated as indicated. Interim medical history since our last visit reviewed. Allergies and medications reviewed and updated.  Review of Systems  Constitutional: Negative.   Respiratory: Negative.    Cardiovascular: Negative.   Gastrointestinal: Negative.   Genitourinary: Negative.   Musculoskeletal: Negative.   Skin: Negative.   Psychiatric/Behavioral:  Negative.     Per HPI unless specifically indicated above     Objective:    BP (!) 152/96    Pulse (!) 101    Temp 99.2 F (37.3 C)    Wt 217 lb (98.4 kg)    SpO2 98%    BMI 38.44 kg/m   Wt Readings from Last 3 Encounters:  05/16/21 217 lb (98.4 kg)  02/17/21 217 lb 2.5 oz (98.5 kg)  12/31/20 217 lb 3.2 oz (98.5 kg)    Physical Exam Vitals and nursing note reviewed.  Constitutional:      General: She is not in acute distress.    Appearance: Normal appearance. She is not ill-appearing, toxic-appearing or diaphoretic.  HENT:     Head: Normocephalic and atraumatic.     Right Ear: External ear normal.     Left Ear: External ear normal.     Nose: Nose normal.     Mouth/Throat:     Mouth: Mucous membranes are moist.     Pharynx: Oropharynx is clear.  Eyes:     General: No scleral icterus.       Right eye: No discharge.        Left eye: No discharge.     Extraocular Movements: Extraocular movements intact.     Conjunctiva/sclera: Conjunctivae normal.     Pupils: Pupils are equal, round, and reactive to light.  Cardiovascular:     Rate and Rhythm: Normal rate and regular rhythm.     Pulses: Normal pulses.  Heart sounds: Normal heart sounds. No murmur heard.   No friction rub. No gallop.  Pulmonary:     Effort: Pulmonary effort is normal. No respiratory distress.     Breath sounds: Normal breath sounds. No stridor. No wheezing, rhonchi or rales.  Chest:     Chest wall: No tenderness.  Musculoskeletal:        General: Normal range of motion.     Cervical back: Normal range of motion and neck supple.  Skin:    General: Skin is warm and dry.     Capillary Refill: Capillary refill takes less than 2 seconds.     Coloration: Skin is not jaundiced or pale.     Findings: No bruising, erythema, lesion or rash.  Neurological:     General: No focal deficit present.     Mental Status: She is alert and oriented to person, place, and time. Mental status is at baseline.   Psychiatric:        Mood and Affect: Mood normal.        Behavior: Behavior normal.        Thought Content: Thought content normal.        Judgment: Judgment normal.    Results for orders placed or performed in visit on 05/16/21  Microscopic Examination   Urine  Result Value Ref Range   WBC, UA None seen 0 - 5 /hpf   RBC 0-2 0 - 2 /hpf   Epithelial Cells (non renal) 0-10 0 - 10 /hpf   Mucus, UA Present (A) Not Estab.   Bacteria, UA None seen None seen/Few  Microalbumin, Urine Waived  Result Value Ref Range   Microalb, Ur Waived 80 (H) 0 - 19 mg/L   Creatinine, Urine Waived 200 10 - 300 mg/dL   Microalb/Creat Ratio <30 <30 mg/g  Urinalysis, Routine w reflex microscopic  Result Value Ref Range   Specific Gravity, UA 1.025 1.005 - 1.030   pH, UA 7.0 5.0 - 7.5   Color, UA Yellow Yellow   Appearance Ur Clear Clear   Leukocytes,UA Negative Negative   Protein,UA 1+ (A) Negative/Trace   Glucose, UA Negative Negative   Ketones, UA Negative Negative   RBC, UA Negative Negative   Bilirubin, UA Negative Negative   Urobilinogen, Ur 1.0 0.2 - 1.0 mg/dL   Nitrite, UA Negative Negative   Microscopic Examination See below:       Assessment & Plan:   Problem List Items Addressed This Visit       Cardiovascular and Mediastinum   Paroxysmal tachycardia (HCC)    Not doing well. Will restart metoprolol. Recheck 2-4 weeks.      Relevant Medications   metoprolol tartrate (LOPRESSOR) 25 MG tablet   Primary hypertension    Not doing well. Will restart metoprolol. Recheck 2-4 weeks.      Relevant Medications   metoprolol tartrate (LOPRESSOR) 25 MG tablet   Other Relevant Orders   Microalbumin, Urine Waived (Completed)     Digestive   Gastroesophageal reflux disease    Getting worse. Needs to get back in to see GI. Referral generated today. Follow up with them.       Relevant Orders   Ambulatory referral to Gastroenterology   Acute gastritis without hemorrhage    Getting  worse. Needs to get back in to see GI. Referral generated today. Follow up with them.         Other   Depression, recurrent ([place])    Doing better on  lower dose. Will continue current regimen. Call with any concerns.       Relevant Medications   citalopram (CELEXA) 20 MG tablet   Port-A-Cath in place - Primary   Relevant Orders   Ambulatory referral to General Surgery   US Soft Tissue Head/Neck (NON-THYROID)   Other Visit Diagnoses     Neck swelling       Will check [place]. Await results.    Relevant Orders   US Soft Tissue Head/Neck (NON-THYROID)   Dark urine       Will check urine. Await results.    Relevant Orders   Urinalysis, Routine w reflex microscopic (Completed)        Follow up plan: Return in 2 weeks (on 05/30/2021), or physical.

## 2021-05-16 NOTE — Assessment & Plan Note (Signed)
Doing better on lower dose. Will continue current regimen. Call with any concerns.

## 2021-05-17 ENCOUNTER — Telehealth: Payer: Self-pay

## 2021-05-17 NOTE — Telephone Encounter (Signed)
Scheduled for 08/23/2021

## 2021-05-25 ENCOUNTER — Ambulatory Visit: Payer: Medicaid Other | Admitting: Surgery

## 2021-05-27 ENCOUNTER — Ambulatory Visit
Admission: RE | Admit: 2021-05-27 | Discharge: 2021-05-27 | Disposition: A | Discharge status: Home / Self Care | Payer: Medicaid Other | Source: Ambulatory Visit | Attending: Family Medicine | Admitting: Family Medicine

## 2021-05-27 NOTE — Progress Notes (Signed)
I called patient due to not showing up in same day floor for port flush appointment today. Per patient, she is going to [facility] to get her port look at and they may flush it there.

## 2021-05-30 ENCOUNTER — Ambulatory Visit
Admission: RE | Admit: 2021-05-30 | Discharge: 2021-05-30 | Disposition: A | Discharge status: Home / Self Care | Payer: Medicaid Other | Source: Ambulatory Visit | Attending: Family Medicine | Admitting: Family Medicine

## 2021-05-30 ENCOUNTER — Other Ambulatory Visit: Payer: Self-pay

## 2021-05-30 DIAGNOSIS — R221 Localized swelling, mass and lump, neck: Secondary | ICD-10-CM | POA: Insufficient documentation

## 2021-05-30 DIAGNOSIS — Z95828 Presence of other vascular implants and grafts: Secondary | ICD-10-CM | POA: Diagnosis not present

## 2021-06-02 ENCOUNTER — Ambulatory Visit (INDEPENDENT_AMBULATORY_CARE_PROVIDER_SITE_OTHER): Payer: Medicaid Other | Admitting: Family Medicine

## 2021-06-02 ENCOUNTER — Encounter: Payer: Self-pay | Admitting: Family Medicine

## 2021-06-02 ENCOUNTER — Other Ambulatory Visit: Payer: Self-pay

## 2021-06-02 VITALS — BP 147/95 | HR 77 | Temp 98.5°F | Ht 63.0 in | Wt 217.0 lb

## 2021-06-02 DIAGNOSIS — Z0000 Encounter for general adult medical examination without abnormal findings: Secondary | ICD-10-CM

## 2021-06-02 DIAGNOSIS — I10 Essential (primary) hypertension: Secondary | ICD-10-CM | POA: Diagnosis not present

## 2021-06-02 MED ORDER — METOPROLOL TARTRATE 50 MG PO TABS: 50.0000 mg | ORAL_TABLET | Freq: Two times a day (BID) | ORAL | 1 refills | Status: DC

## 2021-06-02 NOTE — Progress Notes (Signed)
BP (!) 147/95    Pulse 77    Temp 98.5 F (36.9 C)    Ht 5\' 3"  (1.6 m)    Wt 217 lb (98.4 kg)    SpO2 96%    BMI 38.44 kg/m    Subjective:    Patient ID: Katie Stanley, female    DOB: 06-28-1985, 36 y.o.   MRN: [id number]  HPI: Katie Stanley is a 36 y.o. female presenting on 06/02/2021 for comprehensive medical examination. Current medical complaints include:  Feeling better since getting back on the metoprolol.  She currently lives with: boyfriend and son Menopausal Symptoms: no  Depression Screen done today and results listed below:  Depression screen [facility] 2/9 06/02/2021 05/16/2021 12/31/2020 04/22/2020 05/12/2019  Decreased Interest 1 0 3 0 3  Down, Depressed, Hopeless 0 0 3 0 2  PHQ - 2 Score 1 0 6 0 5  Altered sleeping 2 2 3  0 3  Tired, decreased energy 2 2 3  0 3  Change in appetite 2 2 3  0 2  Feeling bad or failure about yourself  1 0 2 0 2  Trouble concentrating 0 0 1 0 0  Moving slowly or fidgety/restless 0 0 0 0 0  Suicidal thoughts 0 0 0 0 0  PHQ-9 Score 8 6 18  0 15  Difficult doing work/chores - - Somewhat difficult - Somewhat difficult    Past Medical History:  Past Medical History:  Diagnosis Date   Anxiety    Biliary calculi 12/07/2009   Cholelithiasis    Depression    GERD (gastroesophageal reflux disease)    Hepatitis C    body "self healed" per patient   Palpitations    Retained intrauterine contraceptive device (IUD) 01/20/2020   Substance abuse ([place])     Surgical History:  Past Surgical History:  Procedure Laterality Date   CHOLECYSTECTOMY     2011   ESOPHAGOGASTRODUODENOSCOPY (EGD) WITH PROPOFOL N/A 01/21/2018   Procedure: ESOPHAGOGASTRODUODENOSCOPY (EGD) WITH Biopsy;  Surgeon: Lucilla Lame, MD;  Location: [facility];  Service: Endoscopy;  Laterality: N/A;   HYSTEROSCOPY WITH D & C N/A 01/20/2020   Procedure: DILATATION AND CURETTAGE /HYSTEROSCOPY;  Surgeon: Will Bonnet, MD;  Location: [facility];  Service: Gynecology;  Laterality:  N/A;   INTRAUTERINE DEVICE (IUD) INSERTION N/A 01/20/2020   Procedure: INTRAUTERINE DEVICE (IUD) INSERTION MIRENA IUD;  Surgeon: Will Bonnet, MD;  Location: [facility];  Service: Gynecology;  Laterality: N/A;   IUD REMOVAL N/A 01/20/2020   Procedure: INTRAUTERINE DEVICE (IUD) REMOVAL;  Surgeon: Will Bonnet, MD;  Location: [facility];  Service: Gynecology;  Laterality: N/A;   PORTA CATH INSERTION N/A 01/01/2019   Procedure: PORTA CATH INSERTION;  Surgeon: Algernon Huxley, MD;  Location: [facility];  Service: Cardiovascular;  Laterality: N/A;    Medications:  Current Outpatient Medications on File Prior to Visit  Medication Sig   albuterol (VENTOLIN HFA) 108 (90 Base) MCG/ACT inhaler Inhale 2 puffs into the lungs every 6 (six) hours as needed for wheezing or shortness of breath.   citalopram (CELEXA) 20 MG tablet Take 1 tablet (20 mg total) by mouth daily.   Naphazoline HCl (CLEAR EYES OP) Place 1 drop into both eyes daily as needed (itching).   pantoprazole (PROTONIX) 40 MG tablet TAKE 1 TABLET(40 MG) BY MOUTH DAILY   SUBOXONE 8-2 MG FILM Place 1 Film under the tongue 2 (two) times daily.    No current facility-administered medications on file  prior to visit.    Allergies:  Allergies  Allergen Reactions   Penicillins Swelling    Social History:  Social History   Socioeconomic History   Marital status: Single    Spouse name: Not on file   Number of children: Not on file   Years of education: Not on file   Highest education level: Not on file  Occupational History   Not on file  Tobacco Use   Smoking status: Every Day    Packs/day: 1.00    Years: 12.00    Pack years: 12.00    Types: Cigarettes   Smokeless tobacco: Never  Vaping Use   Vaping Use: Never used  Substance and Sexual Activity   Alcohol use: Yes    Comment: 6 BEERS SOME DAY, 20 shots per day   Drug use: Yes    Types: Marijuana   Sexual activity: Yes    Birth control/protection: Implant,  I.U.D.  Other Topics Concern   Not on file  Social History Narrative   Not on file   Social Determinants of Health   Financial Resource Strain: Not on file  Food Insecurity: Not on file  Transportation Needs: Not on file  Physical Activity: Not on file  Stress: Not on file  Social Connections: Not on file  Intimate Partner Violence: Not on file   Social History   Tobacco Use  Smoking Status Every Day   Packs/day: 1.00   Years: 12.00   Pack years: 12.00   Types: Cigarettes  Smokeless Tobacco Never   Social History   Substance and Sexual Activity  Alcohol Use Yes   Comment: 6 BEERS SOME DAY, 20 shots per day    Family History:  Family History  Problem Relation Age of Onset   Hypertension Mother    Diabetes Father    Hypertension Father    Liver cancer Maternal Grandmother    Hypertension Maternal Grandfather    Breast cancer Paternal Grandmother    Heart disease Paternal Grandfather     Past medical history, surgical history, medications, allergies, family history and social history reviewed with patient today and changes made to appropriate areas of the chart.   Review of Systems  Constitutional: Negative.   HENT: Negative.    Eyes:  Positive for blurred vision. Negative for double vision, photophobia, pain, discharge and redness.  Respiratory:  Positive for cough. Negative for hemoptysis, sputum production, shortness of breath and wheezing.   Cardiovascular:  Positive for palpitations. Negative for chest pain, orthopnea, claudication, leg swelling and PND.  Gastrointestinal:  Positive for diarrhea, heartburn, nausea and vomiting. Negative for abdominal pain, blood in stool, constipation and melena.  Genitourinary: Negative.   Musculoskeletal: Negative.   Skin: Negative.   Neurological: Negative.   Endo/Heme/Allergies:  Positive for polydipsia. Negative for environmental allergies. Bruises/bleeds easily.  Psychiatric/Behavioral: Negative.    All other ROS  negative except what is listed above and in the HPI.      Objective:    BP (!) 147/95    Pulse 77    Temp 98.5 F (36.9 C)    Ht 5\' 3"  (1.6 m)    Wt 217 lb (98.4 kg)    SpO2 96%    BMI 38.44 kg/m   Wt Readings from Last 3 Encounters:  06/02/21 217 lb (98.4 kg)  05/16/21 217 lb (98.4 kg)  03/02/21 210 lb (95.3 kg)    Physical Exam Vitals and nursing note reviewed.  Constitutional:      General:  She is not in acute distress.    Appearance: Normal appearance. She is not ill-appearing, toxic-appearing or diaphoretic.  HENT:     Head: Normocephalic and atraumatic.     Right Ear: Tympanic membrane, ear canal and external ear normal. There is no impacted cerumen.     Left Ear: Tympanic membrane, ear canal and external ear normal. There is no impacted cerumen.     Nose: Nose normal. No congestion or rhinorrhea.     Mouth/Throat:     Mouth: Mucous membranes are moist.     Pharynx: Oropharynx is clear. No oropharyngeal exudate or posterior oropharyngeal erythema.  Eyes:     General: No scleral icterus.       Right eye: No discharge.        Left eye: No discharge.     Extraocular Movements: Extraocular movements intact.     Conjunctiva/sclera: Conjunctivae normal.     Pupils: Pupils are equal, round, and reactive to light.  Neck:     Vascular: No carotid bruit.  Cardiovascular:     Rate and Rhythm: Normal rate and regular rhythm.     Pulses: Normal pulses.     Heart sounds: No murmur heard.   No friction rub. No gallop.  Pulmonary:     Effort: Pulmonary effort is normal. No respiratory distress.     Breath sounds: Normal breath sounds. No stridor. No wheezing, rhonchi or rales.  Chest:     Chest wall: No tenderness.  Abdominal:     General: Abdomen is flat. Bowel sounds are normal. There is no distension.     Palpations: Abdomen is soft. There is no mass.     Tenderness: There is no abdominal tenderness. There is no right CVA tenderness, left CVA tenderness, guarding or rebound.      Hernia: No hernia is present.  Genitourinary:    Comments: Pelvic exams deferred with shared decision making Musculoskeletal:        General: No swelling, tenderness, deformity or signs of injury.     Cervical back: Normal range of motion and neck supple. No rigidity. No muscular tenderness.     Right lower leg: No edema.     Left lower leg: No edema.  Lymphadenopathy:     Cervical: No cervical adenopathy.  Skin:    General: Skin is warm and dry.     Capillary Refill: Capillary refill takes less than 2 seconds.     Coloration: Skin is not jaundiced or pale.     Findings: No bruising, erythema, lesion or rash.  Neurological:     General: No focal deficit present.     Mental Status: She is alert and oriented to person, place, and time. Mental status is at baseline.     Cranial Nerves: No cranial nerve deficit.     Sensory: No sensory deficit.     Motor: No weakness.     Coordination: Coordination normal.     Gait: Gait normal.     Deep Tendon Reflexes: Reflexes normal.  Psychiatric:        Mood and Affect: Mood normal.        Behavior: Behavior normal.        Thought Content: Thought content normal.        Judgment: Judgment normal.    Results for orders placed or performed in visit on 05/16/21  Microscopic Examination   Urine  Result Value Ref Range   WBC, UA None seen 0 - 5 /hpf  RBC 0-2 0 - 2 /hpf   Epithelial Cells (non renal) 0-10 0 - 10 /hpf   Mucus, UA Present (A) Not Estab.   Bacteria, UA None seen None seen/Few  Microalbumin, Urine Waived  Result Value Ref Range   Microalb, Ur Waived 80 (H) 0 - 19 mg/L   Creatinine, Urine Waived 200 10 - 300 mg/dL   Microalb/Creat Ratio <30 <30 mg/g  Urinalysis, Routine w reflex microscopic  Result Value Ref Range   Specific Gravity, UA 1.025 1.005 - 1.030   pH, UA 7.0 5.0 - 7.5   Color, UA Yellow Yellow   Appearance Ur Clear Clear   Leukocytes,UA Negative Negative   Protein,UA 1+ (A) Negative/Trace   Glucose, UA  Negative Negative   Ketones, UA Negative Negative   RBC, UA Negative Negative   Bilirubin, UA Negative Negative   Urobilinogen, Ur 1.0 0.2 - 1.0 mg/dL   Nitrite, UA Negative Negative   Microscopic Examination See below:       Assessment & Plan:   Problem List Items Addressed This Visit       Cardiovascular and Mediastinum   Primary hypertension    Still running a little high. Will increase her metoprolol and recheck 1 month. Call with any concerns.       Relevant Medications   metoprolol tartrate (LOPRESSOR) 50 MG tablet   Other Visit Diagnoses     Routine general medical examination at a health care facility    -  Primary   Vaccines up to date. Screening labs up to date. Pap up to date. Continue diet and exercise. Call with any concerns.         Follow up plan: Return in about 4 weeks (around 06/30/2021) for BP follow up.   LABORATORY TESTING:  - Pap smear: up to date  IMMUNIZATIONS:   - Tdap: Tetanus vaccination status reviewed: last tetanus booster within 10 years. - Influenza: Refused - Pneumovax: Up to date - Prevnar: Not applicable - COVID: Refused  PATIENT COUNSELING:   Advised to take 1 mg of folate supplement per day if capable of pregnancy.   Sexuality: Discussed sexually transmitted diseases, partner selection, use of condoms, avoidance of unintended pregnancy  and contraceptive alternatives.   Advised to avoid cigarette smoking.  I discussed with the patient that most people either abstain from alcohol or drink within safe limits (<=14/week and <=4 drinks/occasion for males, <=7/weeks and <= 3 drinks/occasion for females) and that the risk for alcohol disorders and other health effects rises proportionally with the number of drinks per week and how often a drinker exceeds daily limits.  Discussed cessation/primary prevention of drug use and availability of treatment for abuse.   Diet: Encouraged to adjust caloric intake to maintain  or achieve ideal  body weight, to reduce intake of dietary saturated fat and total fat, to limit sodium intake by avoiding high sodium foods and not adding table salt, and to maintain adequate dietary potassium and calcium preferably from fresh fruits, vegetables, and low-fat dairy products.    stressed the importance of regular exercise  Injury prevention: Discussed safety belts, safety helmets, smoke detector, smoking near bedding or upholstery.   Dental health: Discussed importance of regular tooth brushing, flossing, and dental visits.    NEXT PREVENTATIVE PHYSICAL DUE IN 1 YEAR. Return in about 4 weeks (around 06/30/2021) for BP follow up.

## 2021-06-02 NOTE — Assessment & Plan Note (Signed)
Still running a little high. Will increase her metoprolol and recheck 1 month. Call with any concerns.

## 2021-06-03 ENCOUNTER — Telehealth: Payer: Self-pay

## 2021-06-03 ENCOUNTER — Ambulatory Visit: Payer: Medicaid Other | Admitting: Surgery

## 2021-06-03 ENCOUNTER — Other Ambulatory Visit: Payer: Self-pay

## 2021-06-03 DIAGNOSIS — Z95828 Presence of other vascular implants and grafts: Secondary | ICD-10-CM

## 2021-06-03 NOTE — Telephone Encounter (Signed)
Per Dr. Hampton Abbot:   This patient is on my schedule for tomorrow.  She may have two issues, but cant tell for sure.  She's reporting swelling in her neck but also issues with her Port-a-cath.  She has a port placed by Dr. Lucky Cowboy for difficulty with IV draws, etc.  She also had an U/S done of the neck on 05/30/21 which did not show any masses.   So... if she's having issues with the port-a-cath, she needs a referral to Dr. Lucky Cowboy since he is the one that placed it.  Can y'all contact her to check if the swelling is where the port-a-cath is located or where the tubing runs across the neck?  If that's the case, then even more reason to see Dr. Lucky Cowboy.  If these are two separate issues, I can see her for the neck swelling but she would need to see Dr. Lucky Cowboy for the port issues.    Referral has been placed to Dr. Lucky Cowboy. Pt notified. Verbalized understanding.

## 2021-06-28 ENCOUNTER — Telehealth (INDEPENDENT_AMBULATORY_CARE_PROVIDER_SITE_OTHER): Payer: Self-pay

## 2021-06-28 NOTE — Telephone Encounter (Signed)
Spoke with the patient and per the patient there isn't anything wrong with her port. I explained we either exchange, put in or take out a port. Patient stated her PCP wanted it looked at but she is having it flushed  and have them look to and find out if she needs to replace her port. ?

## 2021-06-29 ENCOUNTER — Ambulatory Visit
Admission: RE | Admit: 2021-06-29 | Discharge: 2021-06-29 | Disposition: A | Discharge status: Home / Self Care | Payer: Medicaid Other | Source: Ambulatory Visit | Attending: Family Medicine | Admitting: Family Medicine

## 2021-06-29 ENCOUNTER — Other Ambulatory Visit: Payer: Self-pay

## 2021-06-29 DIAGNOSIS — B182 Chronic viral hepatitis C: Secondary | ICD-10-CM | POA: Diagnosis not present

## 2021-06-29 DIAGNOSIS — Z0000 Encounter for general adult medical examination without abnormal findings: Secondary | ICD-10-CM | POA: Diagnosis present

## 2021-06-29 DIAGNOSIS — K2900 Acute gastritis without bleeding: Secondary | ICD-10-CM | POA: Diagnosis not present

## 2021-06-29 NOTE — Progress Notes (Signed)
Pt. presented to [facility] for port flush today. No current order. Notified Park Liter, DO and [facility]. Pt. stated she has contacted office to assess port but they only remove and replace. Dr. Wynetta Emery stated that she does not manage the ports and if she is not using it, its best for her to have the port removed. Lab attempted x1 for blood work but unsuccessful and pt. refused any more attempts, DO aware. Pt. made aware of options and stated she would reach back for out to  [facility] for possible port replacement or removal.   ?

## 2021-07-04 ENCOUNTER — Ambulatory Visit: Payer: Medicaid Other | Admitting: Family Medicine

## 2021-07-11 ENCOUNTER — Ambulatory Visit (INDEPENDENT_AMBULATORY_CARE_PROVIDER_SITE_OTHER): Payer: Medicaid Other | Admitting: Family Medicine

## 2021-07-11 ENCOUNTER — Encounter: Payer: Self-pay | Admitting: Family Medicine

## 2021-07-11 VITALS — BP 152/116 | HR 81 | Temp 98.9°F | Wt 221.8 lb

## 2021-07-11 DIAGNOSIS — I10 Essential (primary) hypertension: Secondary | ICD-10-CM

## 2021-07-11 DIAGNOSIS — Z95828 Presence of other vascular implants and grafts: Secondary | ICD-10-CM | POA: Diagnosis not present

## 2021-07-11 DIAGNOSIS — J0100 Acute maxillary sinusitis, unspecified: Secondary | ICD-10-CM | POA: Diagnosis not present

## 2021-07-11 MED ORDER — METOPROLOL SUCCINATE ER 50 MG PO TB24: 50.0000 mg | ORAL_TABLET | Freq: Every day | ORAL | 1 refills | Status: DC

## 2021-07-11 MED ORDER — DOXYCYCLINE HYCLATE 100 MG PO TABS
100.0000 mg | ORAL_TABLET | Freq: Two times a day (BID) | ORAL | 0 refills | Status: DC
Start: 2021-07-11 — End: 2021-08-11

## 2021-07-11 MED ORDER — LISINOPRIL 20 MG PO TABS: 20.0000 mg | ORAL_TABLET | Freq: Every day | ORAL | 3 refills | Status: DC

## 2021-07-11 NOTE — Assessment & Plan Note (Signed)
Needs to have port removed. Has not seen vascular. Referral is in. Appointment scheduled.  ?

## 2021-07-11 NOTE — Assessment & Plan Note (Signed)
Not under good control. Will continue metoprolol and change to ER and then will add lisinopril. Continue to monitor. Call with any concerns.  ?

## 2021-07-11 NOTE — Progress Notes (Signed)
? ?BP (!) 152/116   Pulse 81   Temp 98.9 ?F (37.2 ?C)   Wt 221 lb 12.8 oz (100.6 kg)   SpO2 98%   BMI 39.29 kg/m?   ? ?Subjective:  ? ? Patient ID: Katie Stanley, female    DOB: October 24, 1985, 36 y.o.   MRN: [id number] ? ?HPI: ?Katie Stanley is a 36 y.o. female ? ?Chief Complaint  ?Patient presents with  ? Hypertension  ? ?HYPERTENSION ?Hypertension status: uncontrolled  ?Satisfied with current treatment? no ?Duration of hypertension: chronic ?BP monitoring frequency:  not checking ?BP medication side effects:  no ?Medication compliance: excellent compliance ?Previous BP meds: metoprolol ?Aspirin: no ?Recurrent headaches: no ?Visual changes: no ?Palpitations: no ?Dyspnea: no ?Chest pain: no ?Lower extremity edema: no ?Dizzy/lightheaded: no ? ?UPPER RESPIRATORY TRACT INFECTION ?Duration: 2 weeks ?Worst symptom: congestion, facial pain ?Fever: no ?Cough: no ?Shortness of breath: no ?Wheezing: no ?Chest pain: no ?Chest tightness: no ?Chest congestion: no ?Nasal congestion: yes ?Runny nose: yes ?Post nasal drip: yes ?Sneezing: no ?Sore throat: no ?Swollen glands: no ?Sinus pressure: yes ?Headache: yes ?Face pain: yes ?Toothache: no ?Ear pain: no  ?Ear pressure: no  ?Eyes red/itching:no ?Eye drainage/crusting: no  ?Vomiting: no ?Rash: no ?Fatigue: yes ?Sick contacts: no ?Strep contacts: no  ?Context: worse ?Recurrent sinusitis: no ?Relief with OTC cold/cough medications: no  ?Treatments attempted: cold/sinus, mucinex, anti-histamine, and pseudoephedrine  ? ? ?Relevant past medical, surgical, family and social history reviewed and updated as indicated. Interim medical history since our last visit reviewed. ?Allergies and medications reviewed and updated. ? ?Review of Systems  ?Constitutional: Negative.   ?HENT:  Positive for congestion, postnasal drip, rhinorrhea, sinus pressure and sinus pain. Negative for dental problem, drooling, ear discharge, ear pain, facial swelling, hearing loss, mouth sores, nosebleeds,  sneezing, sore throat, tinnitus, trouble swallowing and voice change.   ?Respiratory: Negative.    ?Cardiovascular: Negative.   ?Gastrointestinal: Negative.   ?Musculoskeletal: Negative.   ?Neurological: Negative.   ?Psychiatric/Behavioral: Negative.    ? ?Per HPI unless specifically indicated above ? ?   ?Objective:  ?  ?BP (!) 152/116   Pulse 81   Temp 98.9 ?F (37.2 ?C)   Wt 221 lb 12.8 oz (100.6 kg)   SpO2 98%   BMI 39.29 kg/m?   ?Wt Readings from Last 3 Encounters:  ?07/11/21 221 lb 12.8 oz (100.6 kg)  ?06/02/21 217 lb (98.4 kg)  ?05/16/21 217 lb (98.4 kg)  ?  ?Physical Exam ?Vitals and nursing note reviewed.  ?Constitutional:   ?   General: She is not in acute distress. ?   Appearance: Normal appearance. She is not ill-appearing, toxic-appearing or diaphoretic.  ?HENT:  ?   Head: Normocephalic and atraumatic.  ?   Right Ear: External ear normal.  ?   Left Ear: External ear normal.  ?   Nose: Nose normal.  ?   Mouth/Throat:  ?   Mouth: Mucous membranes are moist.  ?   Pharynx: Oropharynx is clear.  ?Eyes:  ?   General: No scleral icterus.    ?   Right eye: No discharge.     ?   Left eye: No discharge.  ?   Extraocular Movements: Extraocular movements intact.  ?   Conjunctiva/sclera: Conjunctivae normal.  ?   Pupils: Pupils are equal, round, and reactive to light.  ?Cardiovascular:  ?   Rate and Rhythm: Normal rate and regular rhythm.  ?   Pulses: Normal pulses.  ?  Heart sounds: Normal heart sounds. No murmur heard. ?  No friction rub. No gallop.  ?Pulmonary:  ?   Effort: Pulmonary effort is normal. No respiratory distress.  ?   Breath sounds: Normal breath sounds. No stridor. No wheezing, rhonchi or rales.  ?Chest:  ?   Chest wall: No tenderness.  ?Musculoskeletal:     ?   General: Normal range of motion.  ?   Cervical back: Normal range of motion and neck supple.  ?Skin: ?   General: Skin is warm and dry.  ?   Capillary Refill: Capillary refill takes less than 2 seconds.  ?   Coloration: Skin is not  jaundiced or pale.  ?   Findings: No bruising, erythema, lesion or rash.  ?Neurological:  ?   General: No focal deficit present.  ?   Mental Status: She is alert and oriented to person, place, and time. Mental status is at baseline.  ?Psychiatric:     ?   Mood and Affect: Mood normal.     ?   Behavior: Behavior normal.     ?   Thought Content: Thought content normal.     ?   Judgment: Judgment normal.  ? ? ?Results for orders placed or performed in visit on 05/16/21  ?Microscopic Examination  ? Urine  ?Result Value Ref Range  ? WBC, UA None seen 0 - 5 /hpf  ? RBC 0-2 0 - 2 /hpf  ? Epithelial Cells (non renal) 0-10 0 - 10 /hpf  ? Mucus, UA Present (A) Not Estab.  ? Bacteria, UA None seen None seen/Few  ?Microalbumin, Urine Waived  ?Result Value Ref Range  ? Microalb, Ur Waived 80 (H) 0 - 19 mg/L  ? Creatinine, Urine Waived 200 10 - 300 mg/dL  ? Microalb/Creat Ratio <30 <30 mg/g  ?Urinalysis, Routine w reflex microscopic  ?Result Value Ref Range  ? Specific Gravity, UA 1.025 1.005 - 1.030  ? pH, UA 7.0 5.0 - 7.5  ? Color, UA Yellow Yellow  ? Appearance Ur Clear Clear  ? Leukocytes,UA Negative Negative  ? Protein,UA 1+ (A) Negative/Trace  ? Glucose, UA Negative Negative  ? Ketones, UA Negative Negative  ? RBC, UA Negative Negative  ? Bilirubin, UA Negative Negative  ? Urobilinogen, Ur 1.0 0.2 - 1.0 mg/dL  ? Nitrite, UA Negative Negative  ? Microscopic Examination See below:   ? ?   ?Assessment & Plan:  ? ?Problem List Items Addressed This Visit   ? ?  ? Cardiovascular and Mediastinum  ? Primary hypertension - Primary  ?  Not under good control. Will continue metoprolol and change to ER and then will add lisinopril. Continue to monitor. Call with any concerns.  ?  ?  ? Relevant Medications  ? metoprolol succinate (TOPROL-XL) 50 MG 24 hr tablet  ? lisinopril (ZESTRIL) 20 MG tablet  ?  ? Other  ? Port-A-Cath in place  ?  Needs to have port removed. Has not seen vascular. Referral is in. Appointment scheduled.  ?  ?   ? ?Other Visit Diagnoses   ? ? Acute non-recurrent maxillary sinusitis      ? Will start her on doxycycline. Call with any concerns.   ? Relevant Medications  ? doxycycline (VIBRA-TABS) 100 MG tablet  ? ?  ?  ? ?Follow up plan: ?Return in about 4 weeks (around 08/08/2021). ? ? ? ? ? ?

## 2021-07-11 NOTE — H&P (View-Only) (Signed)
? ?BP (!) 152/116   Pulse 81   Temp 98.9 ?F (37.2 ?C)   Wt 221 lb 12.8 oz (100.6 kg)   SpO2 98%   BMI 39.29 kg/m?   ? ?Subjective:  ? ? Patient ID: Katie Stanley, female    DOB: 1986-03-17, 36 y.o.   MRN: [id number] ? ?HPI: ?Katie Stanley is a 36 y.o. female ? ?Chief Complaint  ?Patient presents with  ? Hypertension  ? ?HYPERTENSION ?Hypertension status: uncontrolled  ?Satisfied with current treatment? no ?Duration of hypertension: chronic ?BP monitoring frequency:  not checking ?BP medication side effects:  no ?Medication compliance: excellent compliance ?Previous BP meds: metoprolol ?Aspirin: no ?Recurrent headaches: no ?Visual changes: no ?Palpitations: no ?Dyspnea: no ?Chest pain: no ?Lower extremity edema: no ?Dizzy/lightheaded: no ? ?UPPER RESPIRATORY TRACT INFECTION ?Duration: 2 weeks ?Worst symptom: congestion, facial pain ?Fever: no ?Cough: no ?Shortness of breath: no ?Wheezing: no ?Chest pain: no ?Chest tightness: no ?Chest congestion: no ?Nasal congestion: yes ?Runny nose: yes ?Post nasal drip: yes ?Sneezing: no ?Sore throat: no ?Swollen glands: no ?Sinus pressure: yes ?Headache: yes ?Face pain: yes ?Toothache: no ?Ear pain: no  ?Ear pressure: no  ?Eyes red/itching:no ?Eye drainage/crusting: no  ?Vomiting: no ?Rash: no ?Fatigue: yes ?Sick contacts: no ?Strep contacts: no  ?Context: worse ?Recurrent sinusitis: no ?Relief with OTC cold/cough medications: no  ?Treatments attempted: cold/sinus, mucinex, anti-histamine, and pseudoephedrine  ? ? ?Relevant past medical, surgical, family and social history reviewed and updated as indicated. Interim medical history since our last visit reviewed. ?Allergies and medications reviewed and updated. ? ?Review of Systems  ?Constitutional: Negative.   ?HENT:  Positive for congestion, postnasal drip, rhinorrhea, sinus pressure and sinus pain. Negative for dental problem, drooling, ear discharge, ear pain, facial swelling, hearing loss, mouth sores, nosebleeds,  sneezing, sore throat, tinnitus, trouble swallowing and voice change.   ?Respiratory: Negative.    ?Cardiovascular: Negative.   ?Gastrointestinal: Negative.   ?Musculoskeletal: Negative.   ?Neurological: Negative.   ?Psychiatric/Behavioral: Negative.    ? ?Per HPI unless specifically indicated above ? ?   ?Objective:  ?  ?BP (!) 152/116   Pulse 81   Temp 98.9 ?F (37.2 ?C)   Wt 221 lb 12.8 oz (100.6 kg)   SpO2 98%   BMI 39.29 kg/m?   ?Wt Readings from Last 3 Encounters:  ?07/11/21 221 lb 12.8 oz (100.6 kg)  ?06/02/21 217 lb (98.4 kg)  ?05/16/21 217 lb (98.4 kg)  ?  ?Physical Exam ?Vitals and nursing note reviewed.  ?Constitutional:   ?   General: She is not in acute distress. ?   Appearance: Normal appearance. She is not ill-appearing, toxic-appearing or diaphoretic.  ?HENT:  ?   Head: Normocephalic and atraumatic.  ?   Right Ear: External ear normal.  ?   Left Ear: External ear normal.  ?   Nose: Nose normal.  ?   Mouth/Throat:  ?   Mouth: Mucous membranes are moist.  ?   Pharynx: Oropharynx is clear.  ?Eyes:  ?   General: No scleral icterus.    ?   Right eye: No discharge.     ?   Left eye: No discharge.  ?   Extraocular Movements: Extraocular movements intact.  ?   Conjunctiva/sclera: Conjunctivae normal.  ?   Pupils: Pupils are equal, round, and reactive to light.  ?Cardiovascular:  ?   Rate and Rhythm: Normal rate and regular rhythm.  ?   Pulses: Normal pulses.  ?  Heart sounds: Normal heart sounds. No murmur heard. ?  No friction rub. No gallop.  ?Pulmonary:  ?   Effort: Pulmonary effort is normal. No respiratory distress.  ?   Breath sounds: Normal breath sounds. No stridor. No wheezing, rhonchi or rales.  ?Chest:  ?   Chest wall: No tenderness.  ?Musculoskeletal:     ?   General: Normal range of motion.  ?   Cervical back: Normal range of motion and neck supple.  ?Skin: ?   General: Skin is warm and dry.  ?   Capillary Refill: Capillary refill takes less than 2 seconds.  ?   Coloration: Skin is not  jaundiced or pale.  ?   Findings: No bruising, erythema, lesion or rash.  ?Neurological:  ?   General: No focal deficit present.  ?   Mental Status: She is alert and oriented to person, place, and time. Mental status is at baseline.  ?Psychiatric:     ?   Mood and Affect: Mood normal.     ?   Behavior: Behavior normal.     ?   Thought Content: Thought content normal.     ?   Judgment: Judgment normal.  ? ? ?Results for orders placed or performed in visit on 05/16/21  ?Microscopic Examination  ? Urine  ?Result Value Ref Range  ? WBC, UA None seen 0 - 5 /hpf  ? RBC 0-2 0 - 2 /hpf  ? Epithelial Cells (non renal) 0-10 0 - 10 /hpf  ? Mucus, UA Present (A) Not Estab.  ? Bacteria, UA None seen None seen/Few  ?Microalbumin, Urine Waived  ?Result Value Ref Range  ? Microalb, Ur Waived 80 (H) 0 - 19 mg/L  ? Creatinine, Urine Waived 200 10 - 300 mg/dL  ? Microalb/Creat Ratio <30 <30 mg/g  ?Urinalysis, Routine w reflex microscopic  ?Result Value Ref Range  ? Specific Gravity, UA 1.025 1.005 - 1.030  ? pH, UA 7.0 5.0 - 7.5  ? Color, UA Yellow Yellow  ? Appearance Ur Clear Clear  ? Leukocytes,UA Negative Negative  ? Protein,UA 1+ (A) Negative/Trace  ? Glucose, UA Negative Negative  ? Ketones, UA Negative Negative  ? RBC, UA Negative Negative  ? Bilirubin, UA Negative Negative  ? Urobilinogen, Ur 1.0 0.2 - 1.0 mg/dL  ? Nitrite, UA Negative Negative  ? Microscopic Examination See below:   ? ?   ?Assessment & Plan:  ? ?Problem List Items Addressed This Visit   ? ?  ? Cardiovascular and Mediastinum  ? Primary hypertension - Primary  ?  Not under good control. Will continue metoprolol and change to ER and then will add lisinopril. Continue to monitor. Call with any concerns.  ?  ?  ? Relevant Medications  ? metoprolol succinate (TOPROL-XL) 50 MG 24 hr tablet  ? lisinopril (ZESTRIL) 20 MG tablet  ?  ? Other  ? Port-A-Cath in place  ?  Needs to have port removed. Has not seen vascular. Referral is in. Appointment scheduled.  ?  ?   ? ?Other Visit Diagnoses   ? ? Acute non-recurrent maxillary sinusitis      ? Will start her on doxycycline. Call with any concerns.   ? Relevant Medications  ? doxycycline (VIBRA-TABS) 100 MG tablet  ? ?  ?  ? ?Follow up plan: ?Return in about 4 weeks (around 08/08/2021). ? ? ? ? ? ?

## 2021-07-20 ENCOUNTER — Encounter: Payer: Self-pay | Admitting: Family Medicine

## 2021-07-20 ENCOUNTER — Telehealth (INDEPENDENT_AMBULATORY_CARE_PROVIDER_SITE_OTHER): Payer: Self-pay | Admitting: Nurse Practitioner

## 2021-07-20 DIAGNOSIS — Z95828 Presence of other vascular implants and grafts: Secondary | ICD-10-CM

## 2021-07-20 NOTE — Telephone Encounter (Signed)
Patient called and stated her doctor wanted her port to come out and said it has been 3 months since it's been flushed.  She want an appointment to have it removed.  Please advise. ?

## 2021-07-20 NOTE — Telephone Encounter (Signed)
Spoke with pt and let her know that she would need to have Dr Wynetta Emery send an order over to have the port removed.  Pt stated understanding and will talk to Dr Wynetta Emery. ?

## 2021-07-21 NOTE — Telephone Encounter (Signed)
Katie Stanley- she has a referral in and we called them to see about getting the port removed but she hasn't heard anything. Can we check on it? She just needs to get it removed.  ?

## 2021-07-21 NOTE — Telephone Encounter (Signed)
New referral placed.

## 2021-07-22 ENCOUNTER — Telehealth (INDEPENDENT_AMBULATORY_CARE_PROVIDER_SITE_OTHER): Payer: Self-pay

## 2021-07-22 ENCOUNTER — Other Ambulatory Visit (INDEPENDENT_AMBULATORY_CARE_PROVIDER_SITE_OTHER): Payer: Self-pay | Admitting: Nurse Practitioner

## 2021-07-22 NOTE — Telephone Encounter (Signed)
Spoke with the patient and she is scheduled with Dr. Lucky Cowboy for a port removal on 07/25/21 with a 11:00 am arrival time to the [facility]. Pre-procedure instructions were discussed and patient stated she understood. ?

## 2021-07-25 ENCOUNTER — Ambulatory Visit
Admission: RE | Admit: 2021-07-25 | Discharge: 2021-07-25 | Disposition: A | Discharge status: Home / Self Care | Payer: Medicaid Other | Attending: Vascular Surgery | Admitting: Vascular Surgery

## 2021-07-25 ENCOUNTER — Ambulatory Visit: Payer: Medicaid Other

## 2021-07-25 ENCOUNTER — Encounter: Payer: Self-pay | Admitting: Vascular Surgery

## 2021-07-25 ENCOUNTER — Encounter
Admission: RE | Disposition: A | Discharge status: Home / Self Care | Payer: Self-pay | Source: Home / Self Care | Attending: Vascular Surgery

## 2021-07-25 ENCOUNTER — Other Ambulatory Visit: Payer: Self-pay

## 2021-07-25 DIAGNOSIS — I10 Essential (primary) hypertension: Secondary | ICD-10-CM | POA: Insufficient documentation

## 2021-07-25 DIAGNOSIS — K739 Chronic hepatitis, unspecified: Secondary | ICD-10-CM | POA: Diagnosis not present

## 2021-07-25 DIAGNOSIS — Z452 Encounter for adjustment and management of vascular access device: Secondary | ICD-10-CM | POA: Diagnosis present

## 2021-07-25 DIAGNOSIS — Z79899 Other long term (current) drug therapy: Secondary | ICD-10-CM | POA: Diagnosis not present

## 2021-07-25 DIAGNOSIS — J0100 Acute maxillary sinusitis, unspecified: Secondary | ICD-10-CM | POA: Diagnosis not present

## 2021-07-25 SURGERY — PORTA CATH REMOVAL
Anesthesia: Moderate Sedation

## 2021-07-25 MED ORDER — DIPHENHYDRAMINE HCL 50 MG/ML IJ SOLN
50.0000 mg | Freq: Once | INTRAMUSCULAR | Status: DC | PRN
Start: 2021-07-25 — End: 2021-07-27

## 2021-07-25 MED ORDER — VANCOMYCIN HCL 1500 MG/300ML IV SOLN
1500.0000 mg | INTRAVENOUS | Status: DC
  Filled 2021-07-25: qty 300

## 2021-07-25 MED ORDER — FAMOTIDINE 20 MG PO TABS
40.0000 mg | ORAL_TABLET | Freq: Once | ORAL | Status: DC | PRN
Start: 2021-07-25 — End: 2021-07-27

## 2021-07-25 MED ORDER — ONDANSETRON HCL 4 MG/2ML IJ SOLN
4.0000 mg | Freq: Four times a day (QID) | INTRAMUSCULAR | Status: DC | PRN
Start: 2021-07-25 — End: 2021-07-27

## 2021-07-25 MED ORDER — MIDAZOLAM HCL 2 MG/ML PO SYRP: 8.0000 mg | ORAL_SOLUTION | Freq: Once | ORAL | Status: DC | PRN

## 2021-07-25 MED ORDER — HEPARIN SOD (PORK) LOCK FLUSH 100 UNIT/ML IV SOLN
INTRAVENOUS | Status: DC
Start: 2021-07-25 — End: 2021-07-26
  Filled 2021-07-25: qty 5

## 2021-07-25 MED ORDER — SODIUM CHLORIDE 0.9 % IV SOLN: INTRAVENOUS | Status: DC

## 2021-07-25 MED ORDER — HYDROMORPHONE HCL 1 MG/ML IJ SOLN: 1.0000 mg | Freq: Once | INTRAMUSCULAR | Status: DC | PRN

## 2021-07-25 MED ORDER — METHYLPREDNISOLONE SODIUM SUCC 125 MG IJ SOLR
125.0000 mg | Freq: Once | INTRAMUSCULAR | Status: DC | PRN
Start: 2021-07-25 — End: 2021-07-27

## 2021-07-25 NOTE — OR Nursing (Signed)
Pt reporting that she has swelling in her neck. And she really doesn't want port a cath removal she wants an exchange. Discussed with Dr Lorenso Courier and Dr Lucky Cowboy. [place] ordered. Procedure will depend on [place] results ?

## 2021-07-25 NOTE — Interval H&P Note (Signed)
History and Physical Interval Note: ? ?07/25/2021 ?11:05 AM ? ?Katie Stanley  has presented today for surgery, with the diagnosis of Porta Cath Removal   Chronic hepatitis.  The various methods of treatment have been discussed with the patient and family. After consideration of risks, benefits and other options for treatment, the patient has consented to  Procedure(s): ?PORTA CATH REMOVAL (N/A) as a surgical intervention.  The patient's history has been reviewed, patient examined, no change in status, stable for surgery.  I have reviewed the patient's chart and labs.  Questions were answered to the patient's satisfaction.   ? ? ?Katie Stanley ? ? ?

## 2021-07-25 NOTE — OR Nursing (Signed)
Dr Lorenso Courier reviewed [place] and Genelle Bal flushed port without difficulty. Pt discharged without procedure.  ?

## 2021-08-11 ENCOUNTER — Ambulatory Visit: Payer: Medicaid Other | Admitting: Family Medicine

## 2021-08-11 ENCOUNTER — Encounter: Payer: Self-pay | Admitting: Family Medicine

## 2021-08-11 VITALS — BP 132/88 | HR 78 | Temp 98.0°F | Wt 222.0 lb

## 2021-08-11 DIAGNOSIS — I10 Essential (primary) hypertension: Secondary | ICD-10-CM

## 2021-08-11 DIAGNOSIS — M2540 Effusion, unspecified joint: Secondary | ICD-10-CM | POA: Diagnosis not present

## 2021-08-11 MED ORDER — METOPROLOL SUCCINATE ER 25 MG PO TB24: 25.0000 mg | ORAL_TABLET | Freq: Every day | ORAL | 3 refills | Status: DC

## 2021-08-11 MED ORDER — LISINOPRIL 20 MG PO TABS: 20.0000 mg | ORAL_TABLET | Freq: Every day | ORAL | 1 refills | Status: DC

## 2021-08-11 MED ORDER — METOPROLOL SUCCINATE ER 25 MG PO TB24: 25.0000 mg | ORAL_TABLET | Freq: Every day | ORAL | 1 refills | Status: DC

## 2021-08-11 NOTE — Progress Notes (Signed)
? ?BP 132/88   Pulse 78   Temp 98 ?F (36.7 ?C)   Wt 222 lb (100.7 kg)   SpO2 99%   BMI 39.33 kg/m?   ? ?Subjective:  ? ? Patient ID: Katie Stanley, female    DOB: 04/07/1986, 36 y.o.   MRN: [id number] ? ?HPI: ?Katie Stanley is a 36 y.o. female ? ?Chief Complaint  ?Patient presents with  ? Hypertension  ?  Patient states she has been taking metoprolol 25 mg in the morning instead of 50 mg   ? ?HYPERTENSION ?Hypertension status: controlled  ?Satisfied with current treatment? yes ?Duration of hypertension: chronic ?BP monitoring frequency:  not checking ?BP range:  ?BP medication side effects:  no ?Medication compliance: excellent compliance ?Previous BP meds:metoprolol, lisinopril ?Aspirin: no ?Recurrent headaches: no ?Visual changes: no ?Palpitations: no ?Dyspnea: no ?Chest pain: no ?Lower extremity edema: no ?Dizzy/lightheaded: no ? ?ARTHRALGIAS / JOINT ACHES ?Duration: months ?Pain: yes ?Symmetric: yes ? moderate ?Quality: aching and sore ?Frequency: intermittent ?Context:  worse ?Decreased function/range of motion: yes  ?Erythema: yes ?Swelling: yes ?Heat or warmth: yes ?Morning stiffness: yes ?Relief with NSAIDs?: no ?Treatments attempted:  rest, ice, heat, APAP, ibuprofen, and aleve  ?Involved Joints:  ?   Hands: yes bilateral ?   Wrists: yes bilateral  ?   Elbows: no  ?   Shoulders: no  ?   Back: yes  ?   Hips: no  ?   Knees: no  ?   Ankles: no  ?   Feet: no  ? ? ?Relevant past medical, surgical, family and social history reviewed and updated as indicated. Interim medical history since our last visit reviewed. ?Allergies and medications reviewed and updated. ? ?Review of Systems  ?Constitutional: Negative.   ?Respiratory: Negative.    ?Cardiovascular: Negative.   ?Gastrointestinal: Negative.   ?Musculoskeletal:  Positive for arthralgias and back pain. Negative for gait problem, joint swelling, myalgias, neck pain and neck stiffness.  ?Skin: Negative.   ?Neurological: Negative.    ?Psychiatric/Behavioral: Negative.    ? ?Per HPI unless specifically indicated above ? ?   ?Objective:  ?  ?BP 132/88   Pulse 78   Temp 98 ?F (36.7 ?C)   Wt 222 lb (100.7 kg)   SpO2 99%   BMI 39.33 kg/m?   ?Wt Readings from Last 3 Encounters:  ?08/11/21 222 lb (100.7 kg)  ?07/11/21 221 lb 12.8 oz (100.6 kg)  ?06/02/21 217 lb (98.4 kg)  ?  ?Physical Exam ?Vitals and nursing note reviewed.  ?Constitutional:   ?   General: She is not in acute distress. ?   Appearance: Normal appearance. She is not ill-appearing, toxic-appearing or diaphoretic.  ?HENT:  ?   Head: Normocephalic and atraumatic.  ?   Right Ear: External ear normal.  ?   Left Ear: External ear normal.  ?   Nose: Nose normal.  ?   Mouth/Throat:  ?   Mouth: Mucous membranes are moist.  ?   Pharynx: Oropharynx is clear.  ?Eyes:  ?   General: No scleral icterus.    ?   Right eye: No discharge.     ?   Left eye: No discharge.  ?   Extraocular Movements: Extraocular movements intact.  ?   Conjunctiva/sclera: Conjunctivae normal.  ?   Pupils: Pupils are equal, round, and reactive to light.  ?Cardiovascular:  ?   Rate and Rhythm: Normal rate and regular rhythm.  ?   Pulses: Normal pulses.  ?  Heart sounds: Normal heart sounds. No murmur heard. ?  No friction rub. No gallop.  ?Pulmonary:  ?   Effort: Pulmonary effort is normal. No respiratory distress.  ?   Breath sounds: Normal breath sounds. No stridor. No wheezing, rhonchi or rales.  ?Chest:  ?   Chest wall: No tenderness.  ?Musculoskeletal:     ?   General: Swelling (hands bilaterally) present. Normal range of motion.  ?   Cervical back: Normal range of motion and neck supple.  ?Skin: ?   General: Skin is warm and dry.  ?   Capillary Refill: Capillary refill takes less than 2 seconds.  ?   Coloration: Skin is not jaundiced or pale.  ?   Findings: No bruising, erythema, lesion or rash.  ?Neurological:  ?   General: No focal deficit present.  ?   Mental Status: She is alert and oriented to person, place,  and time. Mental status is at baseline.  ?Psychiatric:     ?   Mood and Affect: Mood normal.     ?   Behavior: Behavior normal.     ?   Thought Content: Thought content normal.     ?   Judgment: Judgment normal.  ? ? ?Results for orders placed or performed in visit on 05/16/21  ?Microscopic Examination  ? Urine  ?Result Value Ref Range  ? WBC, UA None seen 0 - 5 /hpf  ? RBC 0-2 0 - 2 /hpf  ? Epithelial Cells (non renal) 0-10 0 - 10 /hpf  ? Mucus, UA Present (A) Not Estab.  ? Bacteria, UA None seen None seen/Few  ?Microalbumin, Urine Waived  ?Result Value Ref Range  ? Microalb, Ur Waived 80 (H) 0 - 19 mg/L  ? Creatinine, Urine Waived 200 10 - 300 mg/dL  ? Microalb/Creat Ratio <30 <30 mg/g  ?Urinalysis, Routine w reflex microscopic  ?Result Value Ref Range  ? Specific Gravity, UA 1.025 1.005 - 1.030  ? pH, UA 7.0 5.0 - 7.5  ? Color, UA Yellow Yellow  ? Appearance Ur Clear Clear  ? Leukocytes,UA Negative Negative  ? Protein,UA 1+ (A) Negative/Trace  ? Glucose, UA Negative Negative  ? Ketones, UA Negative Negative  ? RBC, UA Negative Negative  ? Bilirubin, UA Negative Negative  ? Urobilinogen, Ur 1.0 0.2 - 1.0 mg/dL  ? Nitrite, UA Negative Negative  ? Microscopic Examination See below:   ? ?   ?Assessment & Plan:  ? ?Problem List Items Addressed This Visit   ? ?  ? Cardiovascular and Mediastinum  ? Primary hypertension - Primary  ?  Under good control on current regimen. Continue current regimen. Continue to monitor. Call with any concerns. Refills given.  ? ? ?  ?  ? Relevant Medications  ? lisinopril (ZESTRIL) 20 MG tablet  ? metoprolol succinate (TOPROL-XL) 25 MG 24 hr tablet  ? ?Other Visit Diagnoses   ? ? Joint swelling      ? Will chcek labs to look for rheumatologic cause. Await results. treat as needed.   ? Relevant Orders  ? Rheumatoid factor  ? ANA  ? CK  ? Rocky mtn spotted fvr abs pnl(IgG+IgM)  ? Uric acid  ? Ehrlichia antibody panel  ? Comprehensive metabolic panel  ? CBC with Differential/Platelet  ? ANA  Comprehensive Panel  ? ?  ?  ? ?Follow up plan: ?Return in about 6 months (around 02/11/2022). ? ? ? ? ? ?

## 2021-08-12 ENCOUNTER — Encounter: Payer: Self-pay | Admitting: Family Medicine

## 2021-08-12 NOTE — Assessment & Plan Note (Signed)
Under good control on current regimen. Continue current regimen. Continue to monitor. Call with any concerns. Refills given.   

## 2021-08-23 ENCOUNTER — Ambulatory Visit: Payer: Medicaid Other | Admitting: Gastroenterology

## 2021-08-24 ENCOUNTER — Ambulatory Visit (INDEPENDENT_AMBULATORY_CARE_PROVIDER_SITE_OTHER): Payer: Medicaid Other | Admitting: Gastroenterology

## 2021-08-24 ENCOUNTER — Encounter: Payer: Self-pay | Admitting: Gastroenterology

## 2021-08-24 VITALS — BP 124/87 | HR 85 | Temp 98.6°F | Wt 222.0 lb

## 2021-08-24 DIAGNOSIS — R1013 Epigastric pain: Secondary | ICD-10-CM

## 2021-08-24 DIAGNOSIS — G8929 Other chronic pain: Secondary | ICD-10-CM

## 2021-08-24 DIAGNOSIS — R748 Abnormal levels of other serum enzymes: Secondary | ICD-10-CM | POA: Diagnosis not present

## 2021-08-24 DIAGNOSIS — R1110 Vomiting, unspecified: Secondary | ICD-10-CM

## 2021-08-24 NOTE — Addendum Note (Signed)
Addended by: Lurlean Nanny on: 08/24/2021 11:15 AM ? ? Modules accepted: Orders ? ?

## 2021-08-24 NOTE — Patient Instructions (Signed)
[email or web address] offers a free, 24/7 Alcohol Abuse Hotline in [place].  ? ?Call [phone]  ?

## 2021-08-24 NOTE — Progress Notes (Signed)
? ? ?Primary Care Physician: Valerie Roys, DO ? ?Primary Gastroenterologist:  Dr. Lucilla Lame ? ?Chief Complaint  ?Patient presents with  ? Abdominal Pain  ?  epigastric  ? Emesis  ? ? ?HPI: Katie Stanley is a 36 y.o. female here after seeing me last year for a refill of her medication for GERD.  The patient was recently found to have abnormal liver enzymes also.  The patient's liver enzymes have shown: ? ?Component ?    Latest Ref Rng 12/10/2019 01/20/2020 03/02/2021  ?Albumin ?    3.5 - 5.0 g/dL 3.5   3.7   ?AST ?    15 - 41 U/L 38   80 (H)   ?ALT ?    0 - 44 U/L 31   52 (H)   ?Alkaline Phosphatase ?    38 - 126 U/L 65   88   ?Total Bilirubin ?    0.3 - 1.2 mg/dL 0.7   0.9   ? ?The patient reports that she had hepatitis C but her body reported off by itself and she never needed treatment.  The patient now reports that she has epigastric discomfort and acknowledges that she drinks much too much.  She states that she drinks 1/5 of whiskey every day for the last 3 years.  She also reports that she is surrounded by people who drink all the time.  She would like to stop drinking. ? ?Past Medical History:  ?Diagnosis Date  ? Anxiety   ? Biliary calculi 12/07/2009  ? Cholelithiasis   ? Depression   ? GERD (gastroesophageal reflux disease)   ? Hepatitis C   ? body "self healed" per patient  ? Palpitations   ? Retained intrauterine contraceptive device (IUD) 01/20/2020  ? Substance abuse ([place])   ? ? ?Current Outpatient Medications  ?Medication Sig Dispense Refill  ? albuterol (VENTOLIN HFA) 108 (90 Base) MCG/ACT inhaler Inhale 2 puffs into the lungs every 6 (six) hours as needed for wheezing or shortness of breath. 8 g 2  ? citalopram (CELEXA) 20 MG tablet Take 1 tablet (20 mg total) by mouth daily. 90 tablet 1  ? lisinopril (ZESTRIL) 20 MG tablet Take 1 tablet (20 mg total) by mouth daily. 90 tablet 1  ? metoprolol succinate (TOPROL-XL) 25 MG 24 hr tablet Take 1 tablet (25 mg total) by mouth daily. 90 tablet 1  ?  Naphazoline HCl (CLEAR EYES OP) Place 1 drop into both eyes daily as needed (itching).    ? pantoprazole (PROTONIX) 40 MG tablet TAKE 1 TABLET(40 MG) BY MOUTH DAILY 90 tablet 1  ? SUBOXONE 8-2 MG FILM Place 1 Film under the tongue 2 (two) times daily.   0  ? ?No current facility-administered medications for this visit.  ? ? ?Allergies as of 08/24/2021 - Review Complete 08/24/2021  ?Allergen Reaction Noted  ? Penicillins Swelling 12/19/2014  ? ? ?ROS: ? ?General: Negative for anorexia, weight loss, fever, chills, fatigue, weakness. ?ENT: Negative for hoarseness, difficulty swallowing , nasal congestion. ?CV: Negative for chest pain, angina, palpitations, dyspnea on exertion, peripheral edema.  ?Respiratory: Negative for dyspnea at rest, dyspnea on exertion, cough, sputum, wheezing.  ?GI: See history of present illness. ?GU:  Negative for dysuria, hematuria, urinary incontinence, urinary frequency, nocturnal urination.  ?Endo: Negative for unusual weight change.  ?  ?Physical Examination: ? ? BP 124/87   Pulse 85   Temp 98.6 ?F (37 ?C) (Oral)   Wt 222 lb (100.7 kg)  BMI 39.33 kg/m?  ? ?General: Well-nourished, well-developed in no acute distress.  ?Eyes: No icterus. Conjunctivae pink. ?Lungs: Clear to auscultation bilaterally. Non-labored. ?Heart: Regular rate and rhythm, no murmurs rubs or gallops.  ?Abdomen: Bowel sounds are normal, nontender, nondistended, no hepatosplenomegaly or masses, no abdominal bruits or hernia , no rebound or guarding.   ?Extremities: No lower extremity edema. No clubbing or deformities. ?Neuro: Alert and oriented x 3.  Grossly intact. ?Skin: Warm and dry, no jaundice.   ?Psych: Alert and cooperative, normal mood and affect. ? ?Labs:  ?  ?Imaging Studies: ?US Venous Img Upper Uni Right(DVT) ? ?Result Date: 07/25/2021 ?CLINICAL DATA:  Right neck swelling regional to jugular approach port a site. EXAM: RIGHT UPPER EXTREMITY VENOUS DOPPLER ULTRASOUND TECHNIQUE: Gray-scale sonography with  graded compression, as well as color Doppler and duplex ultrasound were performed to evaluate the upper extremity deep venous system from the level of the subclavian vein and including the jugular, axillary, basilic, radial, ulnar and upper cephalic vein. Spectral Doppler was utilized to evaluate flow at rest and with distal augmentation maneuvers. COMPARISON:  Chest radiograph-02/17/2021 FINDINGS: Contralateral Subclavian Vein: Respiratory phasicity is normal and symmetric with the symptomatic side. No evidence of thrombus. Normal compressibility. Internal Jugular Vein: No evidence of thrombus. Port a catheter tubing is noted within the right jugular vein without evidence pericatheter thrombosis (images 5 and 6). Normal compressibility, respiratory phasicity and response to augmentation. Subclavian Vein: No evidence of thrombus. Normal compressibility, respiratory phasicity and response to augmentation. Axillary Vein: No evidence of thrombus. Normal compressibility, respiratory phasicity and response to augmentation. Cephalic Vein: No evidence of thrombus. Normal compressibility, respiratory phasicity and response to augmentation. Basilic Vein: No evidence of thrombus. Normal compressibility, respiratory phasicity and response to augmentation. Brachial Veins: No evidence of thrombus. Normal compressibility, respiratory phasicity and response to augmentation. Radial Veins: No evidence of thrombus. Normal compressibility, respiratory phasicity and response to augmentation. Ulnar Veins: No evidence of thrombus. Normal compressibility, respiratory phasicity and response to augmentation. Venous Reflux:  None visualized. Other Findings:  None visualized. IMPRESSION: No evidence of DVT within the right upper extremity with special attention paid to the right internal jugular vein. Electronically Signed   By: Sandi Mariscal M.D.   On: 07/25/2021 13:12   ? ?Assessment and Plan:  ? ?Katie Stanley is a 36 y.o. y/o female who  comes in today with a history of abnormal liver enzymes with the pattern consistent with alcoholic hepatitis.  The patient does endorse drinking on a daily basis and would like to stop.  The patient also has epigastric discomfort and will continue her antacid medication.  The patient will be set up for an EGD to rule out any esophagitis or pathology causing her her discomfort.  The patient has been given numbers for alcohol cessation programs.  The patient has been explained the plan agrees with it. ? ? ? ? ?Lucilla Lame, MD. [name] ? ? ? Note: This dictation was prepared with Dragon dictation along with smaller phrase technology. Any transcriptional errors that result from this process are unintentional.  ?

## 2021-08-30 ENCOUNTER — Encounter: Payer: Self-pay | Admitting: Gastroenterology

## 2021-08-30 ENCOUNTER — Other Ambulatory Visit: Payer: Self-pay

## 2021-09-13 ENCOUNTER — Other Ambulatory Visit: Payer: Self-pay | Admitting: Gastroenterology

## 2021-09-29 ENCOUNTER — Ambulatory Visit: Payer: Self-pay | Admitting: Anesthesiology

## 2021-09-29 ENCOUNTER — Encounter: Payer: Self-pay | Admitting: Anesthesiology

## 2021-09-29 ENCOUNTER — Ambulatory Visit
Admission: RE | Admit: 2021-09-29 | Discharge: 2021-09-29 | Disposition: A | Discharge status: Home / Self Care | Payer: Medicaid Other | Attending: Gastroenterology | Admitting: Gastroenterology

## 2021-09-29 ENCOUNTER — Encounter: Payer: Self-pay | Admitting: Gastroenterology

## 2021-09-29 ENCOUNTER — Encounter
Admission: RE | Disposition: A | Discharge status: Home / Self Care | Payer: Self-pay | Source: Home / Self Care | Attending: Gastroenterology

## 2021-09-29 ENCOUNTER — Other Ambulatory Visit: Payer: Self-pay

## 2021-09-29 DIAGNOSIS — R1110 Vomiting, unspecified: Secondary | ICD-10-CM | POA: Diagnosis not present

## 2021-09-29 DIAGNOSIS — K2950 Unspecified chronic gastritis without bleeding: Secondary | ICD-10-CM | POA: Insufficient documentation

## 2021-09-29 DIAGNOSIS — R1013 Epigastric pain: Secondary | ICD-10-CM | POA: Diagnosis not present

## 2021-09-29 DIAGNOSIS — F17210 Nicotine dependence, cigarettes, uncomplicated: Secondary | ICD-10-CM | POA: Diagnosis not present

## 2021-09-29 HISTORY — PX: ESOPHAGOGASTRODUODENOSCOPY: SHX5428

## 2021-09-29 LAB — POCT PREGNANCY, URINE: Preg Test, Ur: NEGATIVE

## 2021-09-29 SURGERY — EGD (ESOPHAGOGASTRODUODENOSCOPY)
Anesthesia: General

## 2021-09-29 MED ORDER — HEPARIN SOD (PORK) LOCK FLUSH 100 UNIT/ML IV SOLN
INTRAVENOUS | Status: AC
  Filled 2021-09-29: qty 5

## 2021-09-29 MED ORDER — PROPOFOL 10 MG/ML IV BOLUS
INTRAVENOUS | Status: DC | PRN
  Administered 2021-09-29: 120 mg via INTRAVENOUS

## 2021-09-29 MED ORDER — LIDOCAINE HCL (CARDIAC) PF 100 MG/5ML IV SOSY
PREFILLED_SYRINGE | INTRAVENOUS | Status: DC | PRN
  Administered 2021-09-29: 100 mg via INTRAVENOUS

## 2021-09-29 MED ORDER — SODIUM CHLORIDE 0.9 % IV SOLN: INTRAVENOUS | Status: DC

## 2021-09-29 SURGICAL SUPPLY — 34 items
BALLN DILATOR 10-12 8 (BALLOONS)
BALLN DILATOR 12-15 8 (BALLOONS) IMPLANT
BALLN DILATOR 15-18 8 (BALLOONS) IMPLANT
BALLN DILATOR CRE 0-12 8 (BALLOONS) IMPLANT
BALLN DILATOR ESOPH 8 10 CRE (MISCELLANEOUS) IMPLANT
BALLOON DILATOR 12-15 8 (BALLOONS) IMPLANT
BALLOON DILATOR 15-18 8 (BALLOONS) IMPLANT
BALLOON DILATOR CRE 0-12 8 (BALLOONS) IMPLANT
BLOCK BITE 60FR ADLT L/F GRN (MISCELLANEOUS) ×2 IMPLANT
CLIP HMST 235XBRD CATH ROT (MISCELLANEOUS) IMPLANT
CLIP RESOLUTION 360 11X235 (MISCELLANEOUS)
ELECT REM PT RETURN 9FT ADLT (ELECTROSURGICAL) IMPLANT
ELECTRODE REM PT RTRN 9FT ADLT (ELECTROSURGICAL) IMPLANT
FCP ESCP3.2XJMB 240X2.8X (MISCELLANEOUS) IMPLANT
FORCEPS BIOP RAD 4 LRG CAP 4 (CUTTING FORCEPS) IMPLANT
FORCEPS BIOP RJ4 240 W/NDL (MISCELLANEOUS)
FORCEPS ESCP3.2XJMB 240X2.8X (MISCELLANEOUS) IMPLANT
GOWN CVR UNV OPN BCK APRN NK (MISCELLANEOUS) ×2 IMPLANT
GOWN ISOL THUMB LOOP REG UNIV (MISCELLANEOUS) ×4
INJECTOR VARIJECT VIN23 (MISCELLANEOUS) IMPLANT
KIT DEFENDO VALVE AND CONN (KITS) IMPLANT
KIT PRC NS LF DISP ENDO (KITS) ×1 IMPLANT
KIT PROCEDURE OLYMPUS (KITS) ×2
MANIFOLD NEPTUNE II (INSTRUMENTS) ×2 IMPLANT
MARKER SPOT ENDO TATTOO 5ML (MISCELLANEOUS) IMPLANT
RETRIEVER NET PLAT FOOD (MISCELLANEOUS) IMPLANT
SNARE SHORT THROW 13M SML OVAL (MISCELLANEOUS) IMPLANT
SNARE SHORT THROW 30M LRG OVAL (MISCELLANEOUS) IMPLANT
SPOT EX ENDOSCOPIC TATTOO (MISCELLANEOUS)
SYR INFLATION 60ML (SYRINGE) IMPLANT
TRAP ETRAP POLY (MISCELLANEOUS) IMPLANT
VARIJECT INJECTOR VIN23 (MISCELLANEOUS) IMPLANT
WATER STERILE IRR 250ML POUR (IV SOLUTION) ×2 IMPLANT
WIRE CRE 18-20MM 8CM F G (MISCELLANEOUS) IMPLANT

## 2021-09-29 NOTE — Op Note (Signed)
[facility] Gastroenterology Patient Name: Katie Stanley Procedure Date: 09/29/2021 8:10 AM MRN: [id number] Account #: [id number] Date of Birth: October 08, 1985 Admit Type: Outpatient Age: 36 Room: [facility] ENDO ROOM 4 Gender: Female Note Status: Finalized Instrument Name: Upper Endoscope [id number] Procedure:             Upper GI endoscopy Indications:           Epigastric abdominal pain Providers:             Lucilla Lame MD, MD Referring MD:          Valerie Roys (Referring MD) Medicines:             Propofol per Anesthesia Complications:         No immediate complications. Procedure:             Pre-Anesthesia Assessment:                        - Prior to the procedure, a History and Physical was                         performed, and patient medications and allergies were                         reviewed. The patient's tolerance of previous                         anesthesia was also reviewed. The risks and benefits                         of the procedure and the sedation options and risks                         were discussed with the patient. All questions were                         answered, and informed consent was obtained. Prior                         Anticoagulants: The patient has taken no previous                         anticoagulant or antiplatelet agents. ASA Grade                         Assessment: II - A patient with mild systemic disease.                         After reviewing the risks and benefits, the patient                         was deemed in satisfactory condition to undergo the                         procedure.                        After obtaining informed consent, the endoscope was  passed under direct vision. Throughout the procedure,                         the patient's blood pressure, pulse, and oxygen                         saturations were monitored continuously. The Endoscope                          was introduced through the mouth, and advanced to the                         second part of duodenum. The upper GI endoscopy was                         accomplished without difficulty. The patient tolerated                         the procedure well. Findings:      The examined esophagus was normal.      Diffuse mild inflammation characterized by erythema was found in the       entire examined stomach. Biopsies were taken with a cold forceps for       histology. Biopsies were taken with a cold forceps for histology.      The examined duodenum was normal. Impression:            - Normal esophagus.                        - Gastritis. Biopsied.                        - Normal examined duodenum. Recommendation:        - Discharge patient to home.                        - Resume previous diet.                        - Continue present medications.                        - Await pathology results. Procedure Code(s):     --- Professional ---                        [phone], Esophagogastroduodenoscopy, flexible,                         transoral; with biopsy, single or multiple Diagnosis Code(s):     --- Professional ---                        R10.13, Epigastric pain                        K29.70, Gastritis, unspecified, without bleeding CPT copyright 2019 American Medical Association. All rights reserved. The codes documented in this report are preliminary and upon coder review may  be revised to meet current compliance requirements. Lucilla Lame MD, MD 09/29/2021 8:28:03 AM This report has been signed electronically. Number of Addenda: 0 Note Initiated  On: 09/29/2021 8:10 AM Estimated Blood Loss:  Estimated blood loss: none.      [facility]

## 2021-09-29 NOTE — H&P (Signed)
Lucilla Lame, MD [address] [place], [place] [address] Phone:[phone] Fax : [phone]  Primary Care Physician:  Valerie Roys, DO Primary Gastroenterologist:  Dr. Allen Norris  Pre-Procedure History & Physical: HPI:  Katie Stanley is a 36 y.o. female is here for an endoscopy.   Past Medical History:  Diagnosis Date   Anxiety    Biliary calculi 12/07/2009   Cholelithiasis    Depression    GERD (gastroesophageal reflux disease)    Hepatitis C    body "self healed" per patient   Palpitations    Retained intrauterine contraceptive device (IUD) 01/20/2020   Substance abuse ([place])     Past Surgical History:  Procedure Laterality Date   CHOLECYSTECTOMY     2011   ESOPHAGOGASTRODUODENOSCOPY (EGD) WITH PROPOFOL N/A 01/21/2018   Procedure: ESOPHAGOGASTRODUODENOSCOPY (EGD) WITH Biopsy;  Surgeon: Lucilla Lame, MD;  Location: [place];  Service: Endoscopy;  Laterality: N/A;   HYSTEROSCOPY WITH D & C N/A 01/20/2020   Procedure: DILATATION AND CURETTAGE /HYSTEROSCOPY;  Surgeon: Will Bonnet, MD;  Location: [facility] ORS;  Service: Gynecology;  Laterality: N/A;   INTRAUTERINE DEVICE (IUD) INSERTION N/A 01/20/2020   Procedure: INTRAUTERINE DEVICE (IUD) INSERTION MIRENA IUD;  Surgeon: Will Bonnet, MD;  Location: [facility] OR[facility];  Service: Gynecology;  Laterality: N/A;   IUD REMOVAL N/A 01/20/2020   Procedure: INTRAUTERINE DEVICE (IUD) REMOVAL;  Surgeon: Will Bonnet, MD;  Location: [facility];  Service: Gynecology;  Laterality: N/A;   PORTA CATH INSERTION N/A 01/01/2019   Procedure: PORTA CATH INSERTION;  Surgeon: Algernon Huxley, MD;  Location: [place] CV LAB;  Service: Cardiovascular;  Laterality: N/A;    Prior to Admission medications   Medication Sig Start Date End Date Taking? Authorizing Provider  albuterol (VENTOLIN HFA) 108 (90 Base) MCG/ACT inhaler Inhale 2 puffs into the lungs every 6 (six) hours as needed for wheezing or shortness of  breath. 02/17/21  Yes Lavonia Drafts, MD  citalopram (CELEXA) 20 MG tablet Take 1 tablet (20 mg total) by mouth daily. 05/16/21  Yes Johnson, Megan P, DO  lisinopril (ZESTRIL) 20 MG tablet Take 1 tablet (20 mg total) by mouth daily. 08/11/21  Yes Johnson, Megan P, DO  metoprolol succinate (TOPROL-XL) 25 MG 24 hr tablet Take 1 tablet (25 mg total) by mouth daily. 08/11/21  Yes Johnson, Megan P, DO  Naphazoline HCl (CLEAR EYES OP) Place 1 drop into both eyes daily as needed (itching).   Yes [provider]  pantoprazole (PROTONIX) 40 MG tablet TAKE 1 TABLET(40 MG) BY MOUTH DAILY 09/13/21  Yes Lucilla Lame, MD  SUBOXONE 8-2 MG FILM Place 1 Film under the tongue 2 (two) times daily.  12/13/17  Yes [provider]    Allergies as of 08/24/2021 - Review Complete 08/24/2021  Allergen Reaction Noted   Penicillins Swelling 12/19/2014    Family History  Problem Relation Age of Onset   Hypertension Mother    Diabetes Father    Hypertension Father    Liver cancer Maternal Grandmother    Hypertension Maternal Grandfather    Breast cancer Paternal Grandmother    Heart disease Paternal Grandfather     Social History   Socioeconomic History   Marital status: Single    Spouse name: Not on file   Number of children: Not on file   Years of education: Not on file   Highest education level: Not on file  Occupational History   Not on file  Tobacco  Use   Smoking status: Every Day    Packs/day: 1.00    Years: 12.00    Total pack years: 12.00    Types: Cigarettes   Smokeless tobacco: Never  Vaping Use   Vaping Use: Never used  Substance and Sexual Activity   Alcohol use: Yes    Comment: 6 BEERS SOME DAY, 20 shots per day, had some beers6/21/2023   Drug use: Yes    Types: Marijuana    Comment: 09/28/2021   Sexual activity: Yes    Birth control/protection: Implant, I.U.D.  Other Topics Concern   Not on file  Social History Narrative   Not on file   Social Determinants of Health    Financial Resource Strain: Not on file  Food Insecurity: Not on file  Transportation Needs: Not on file  Physical Activity: Not on file  Stress: Not on file  Social Connections: Not on file  Intimate Partner Violence: Not on file    Review of Systems: See HPI, otherwise negative ROS  Physical Exam: BP 104/74   Pulse 71   Temp 97.6 F (36.4 C) (Temporal)   Resp 20   Ht '5\' 3"'$  (1.6 m)   Wt 98 kg   SpO2 95%   BMI 38.26 kg/m  General:   Alert,  pleasant and cooperative in NAD Head:  Normocephalic and atraumatic. Neck:  Supple; no masses or thyromegaly. Lungs:  Clear throughout to auscultation.    Heart:  Regular rate and rhythm. Abdomen:  Soft, nontender and nondistended. Normal bowel sounds, without guarding, and without rebound.   Neurologic:  Alert and  oriented x4;  grossly normal neurologically.  Impression/Plan: Katie Stanley is here for an endoscopy to be performed for epigastric pain  Risks, benefits, limitations, and alternatives regarding  endoscopy have been reviewed with the patient.  Questions have been answered.  All parties agreeable.   Lucilla Lame, MD  09/29/2021, 8:10 AM

## 2021-09-29 NOTE — Anesthesia Preprocedure Evaluation (Addendum)
Anesthesia Evaluation  Patient identified by MRN, date of birth, ID band Patient awake    Reviewed: Allergy & Precautions, NPO status , Patient's Chart, lab work & pertinent test results  Airway Mallampati: III  TM Distance: >3 FB Neck ROM: full    Dental  (+) Chipped   Pulmonary Current Smoker and Patient abstained from smoking.,    Pulmonary exam normal        Cardiovascular hypertension, Normal cardiovascular exam  H/o Paroxysmal tachycardia   Neuro/Psych PSYCHIATRIC DISORDERS Anxiety Depression negative neurological ROS     GI/Hepatic GERD  ,(+)     substance abuse  marijuana use,   Endo/Other  negative endocrine ROS  Renal/GU negative Renal ROS  negative genitourinary   Musculoskeletal   Abdominal   Peds  Hematology negative hematology ROS (+)   Anesthesia Other Findings Past Medical History: No date: Anxiety 12/07/2009: Biliary calculi No date: Cholelithiasis No date: Depression No date: GERD (gastroesophageal reflux disease) No date: Hepatitis C     Comment:  body "self healed" per patient No date: Palpitations 01/20/2020: Retained intrauterine contraceptive device (IUD) No date: Substance abuse [facility])  Past Surgical History: No date: CHOLECYSTECTOMY     Comment:  2011 01/21/2018: ESOPHAGOGASTRODUODENOSCOPY (EGD) WITH PROPOFOL; N/A     Comment:  Procedure: ESOPHAGOGASTRODUODENOSCOPY (EGD) WITH Biopsy;              Surgeon: Lucilla Lame, MD;  Location: [facility];  Service: Endoscopy;  Laterality: N/A; 01/20/2020: HYSTEROSCOPY WITH D & C; N/A     Comment:  Procedure: DILATATION AND CURETTAGE /HYSTEROSCOPY;                Surgeon: Will Bonnet, MD;  Location: [facility];                Service: Gynecology;  Laterality: N/A; 01/20/2020: INTRAUTERINE DEVICE (IUD) INSERTION; N/A     Comment:  Procedure: INTRAUTERINE DEVICE (IUD) INSERTION MIRENA               IUD;  Surgeon:  Will Bonnet, MD;  Location: [facility]               ORS;  Service: Gynecology;  Laterality: N/A; 01/20/2020: IUD REMOVAL; N/A     Comment:  Procedure: INTRAUTERINE DEVICE (IUD) REMOVAL;  Surgeon:               Will Bonnet, MD;  Location: [facility];  Service:               Gynecology;  Laterality: N/A; 01/01/2019: PORTA CATH INSERTION; N/A     Comment:  Procedure: PORTA CATH INSERTION;  Surgeon: Algernon Huxley,              MD;  Location: [place] [facility];  Service:               Cardiovascular;  Laterality: N/A;  BMI    Body Mass Index: 38.97 kg/m      Reproductive/Obstetrics negative OB ROS                             Anesthesia Physical Anesthesia Plan  ASA:   Anesthesia Plan: General   Post-op Pain Management:    Induction:   PONV Risk Score and Plan: Propofol infusion and TIVA  Airway Management Planned: Natural Airway  Additional Equipment:  Intra-op Plan:   Post-operative Plan:   Informed Consent:     Dental Advisory Given  Plan Discussed with: Anesthesiologist, CRNA and Surgeon  Anesthesia Plan Comments:         Anesthesia Quick Evaluation

## 2021-09-29 NOTE — Transfer of Care (Signed)
Immediate Anesthesia Transfer of Care Note  Patient: Katie Stanley  Procedure(s) Performed: ESOPHAGOGASTRODUODENOSCOPY (EGD)  Patient Location: Endoscopy Unit  Anesthesia Type:General  Level of Consciousness: awake, alert  and oriented  Airway & Oxygen Therapy: Patient Spontanous Breathing and Patient connected to nasal cannula oxygen  Post-op Assessment: Report given to RN, Post -op Vital signs reviewed and stable and Patient moving all extremities  Post vital signs: Reviewed and stable  Last Vitals:  Vitals Value Taken Time  BP 96/58 09/29/21 0829  Temp 36.2 C 09/29/21 0829  Pulse 64 09/29/21 0829  Resp 11 09/29/21 0829  SpO2 97 % 09/29/21 0829  Vitals shown include unvalidated device data.  Last Pain:  Vitals:   09/29/21 0829  TempSrc: Temporal  PainSc: 0-No pain         Complications: No notable events documented.

## 2021-09-30 ENCOUNTER — Encounter: Payer: Self-pay | Admitting: Gastroenterology

## 2021-10-03 LAB — SURGICAL PATHOLOGY

## 2021-10-05 NOTE — Anesthesia Postprocedure Evaluation (Signed)
Anesthesia Post Note  Patient: Katie Stanley  Procedure(s) Performed: ESOPHAGOGASTRODUODENOSCOPY (EGD)  Patient location during evaluation: PACU Anesthesia Type: General Level of consciousness: awake and alert Pain management: pain level controlled Vital Signs Assessment: post-procedure vital signs reviewed and stable Respiratory status: spontaneous breathing, nonlabored ventilation, respiratory function stable and patient connected to nasal cannula oxygen Cardiovascular status: blood pressure returned to baseline and stable Postop Assessment: no apparent nausea or vomiting Anesthetic complications: no   No notable events documented.   Last Vitals:  Vitals:   09/29/21 0839 09/29/21 0849  BP: 107/64   Pulse: 71 68  Resp: 17 15  Temp:    SpO2: 95% 95%    Last Pain:  Vitals:   09/30/21 0732  TempSrc:   PainSc: 0-No pain                 Molli Barrows

## 2021-10-11 ENCOUNTER — Encounter: Payer: Self-pay | Admitting: Gastroenterology

## 2021-10-13 IMAGING — US US PELVIS COMPLETE WITH TRANSVAGINAL
1 series · 14 of 25 positions shown · non-contrast
Comparison: None

CLINICAL DATA: Pelvic cramping for few months, vaginal spotting,
negative pregnancy test



[Series 1: us pelvis complete with transvaginal · 0.21mm/px · 14 of 89 slices shown]
[im 1/89]
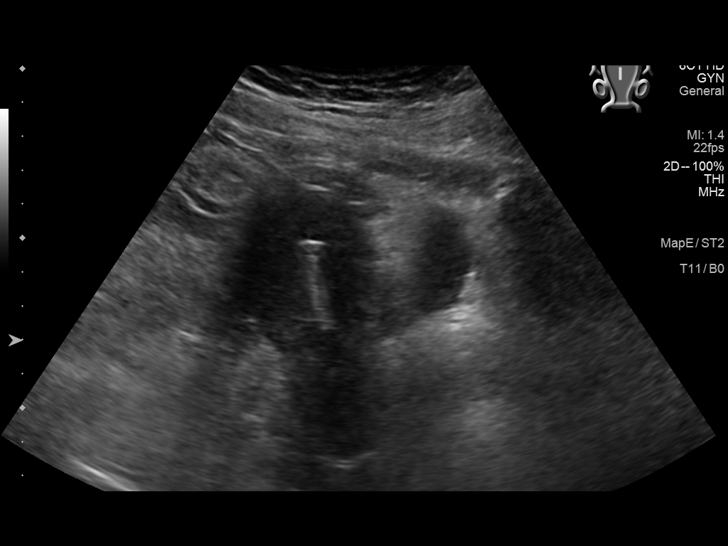
[im 8/89]
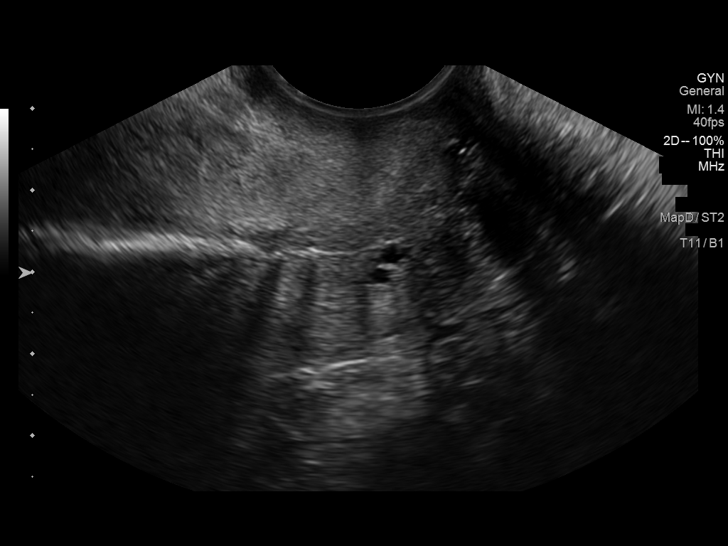
[im 15/89]
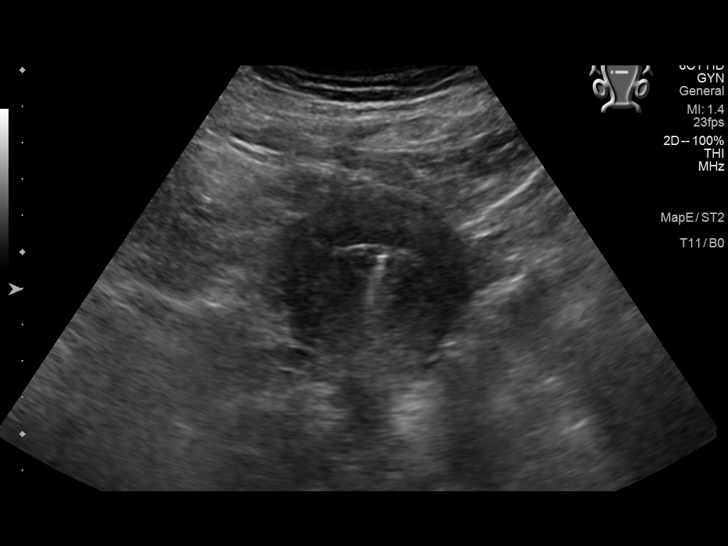
[im 23/89]
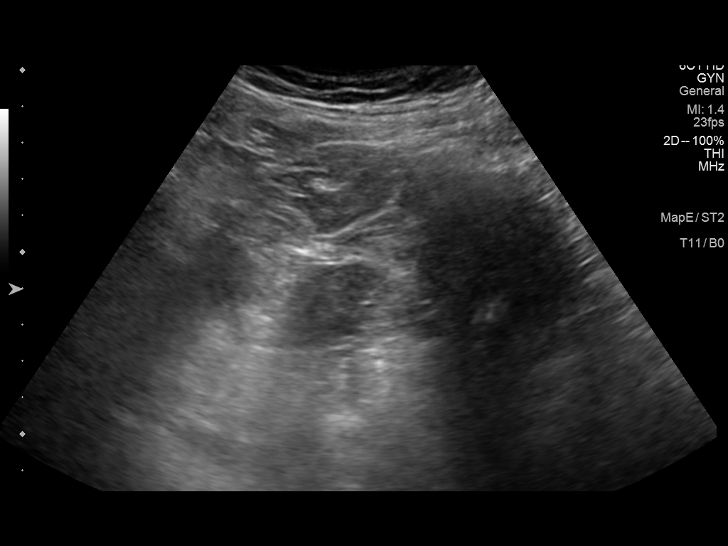
[im 30/89]
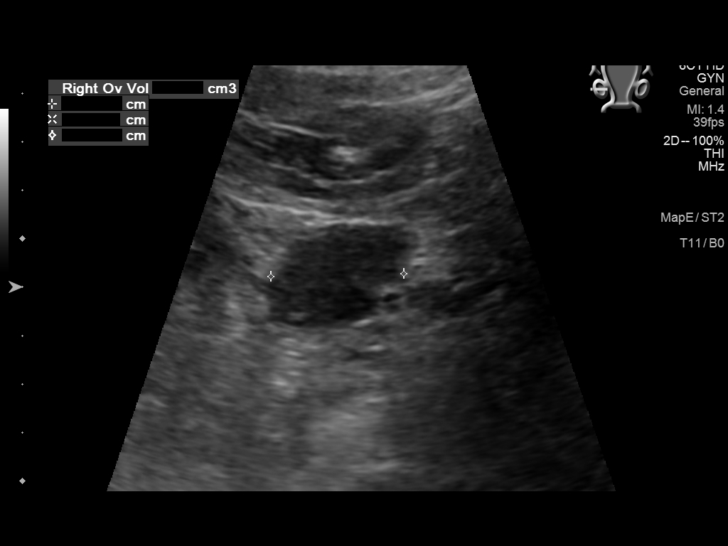
[im 34/89]
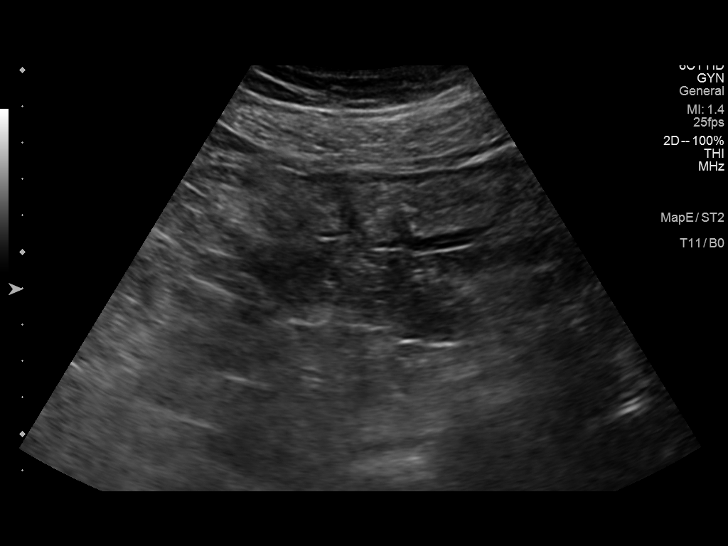
[im 41/89]
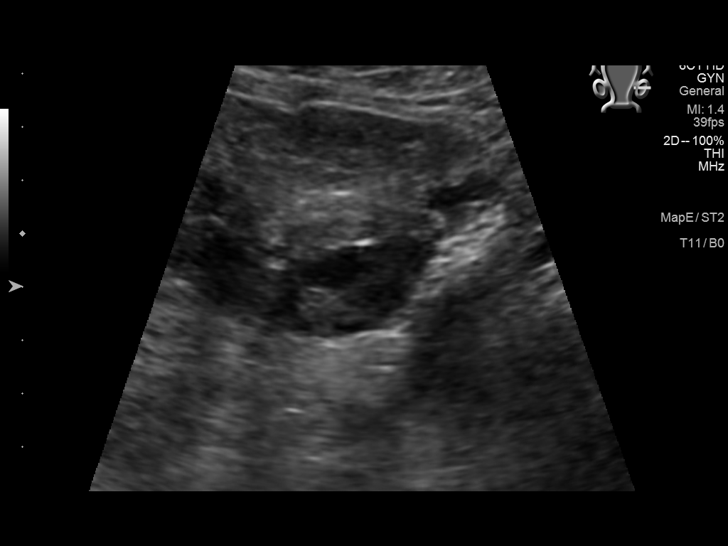
[im 48/89]
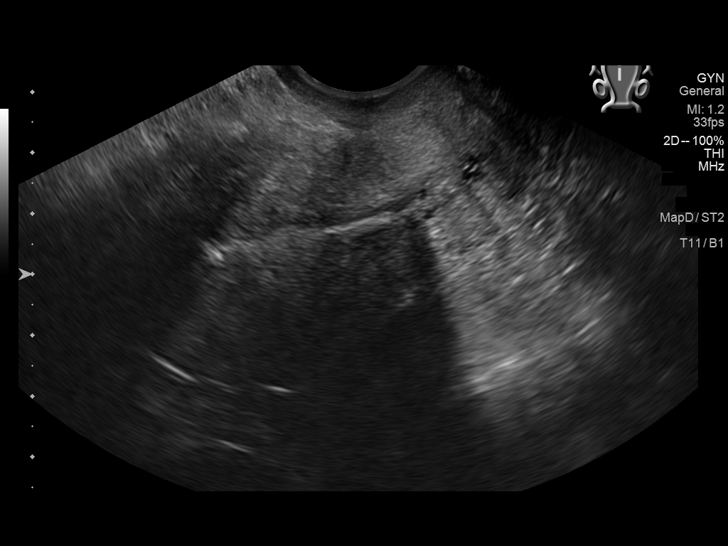
[im 56/89]
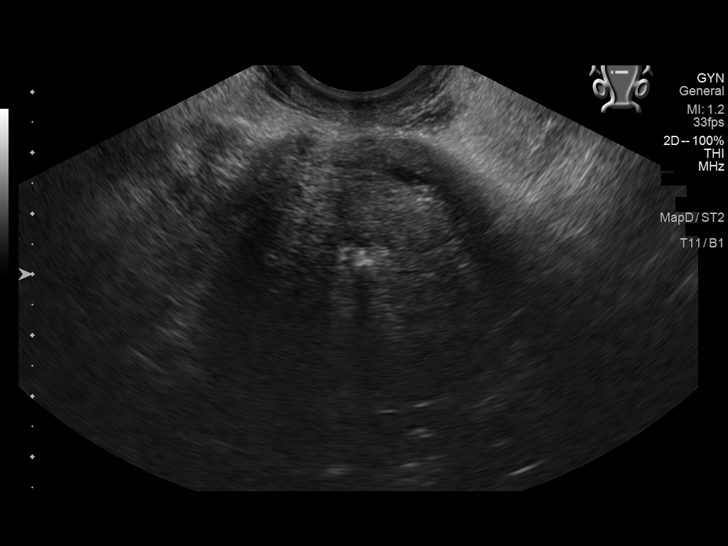
[im 59/89]
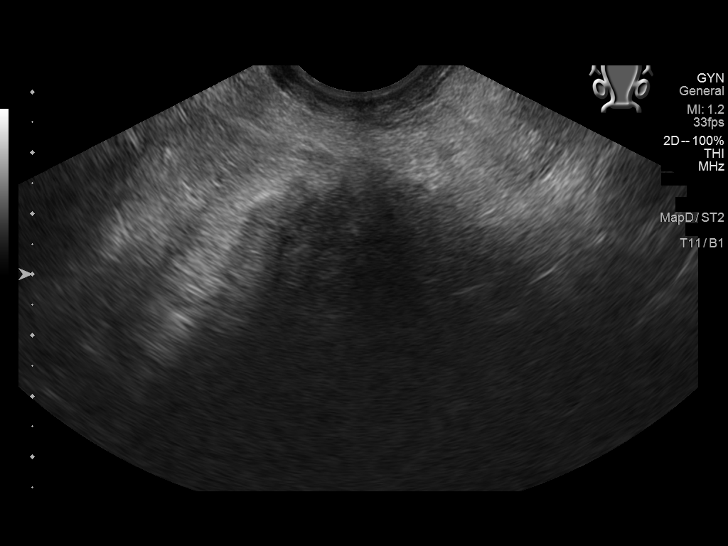
[im 67/89]
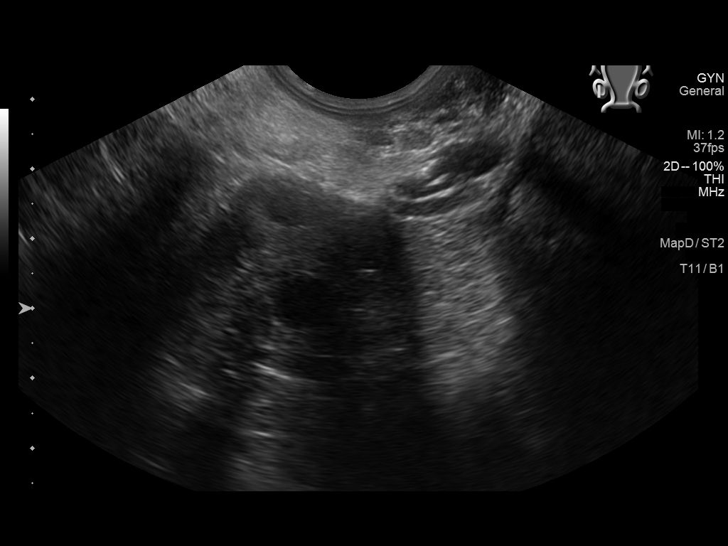
[im 74/89]
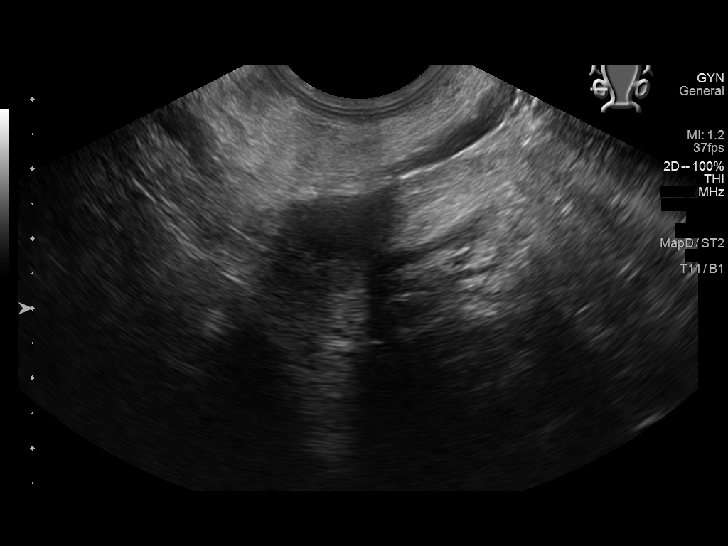
[im 81/89]
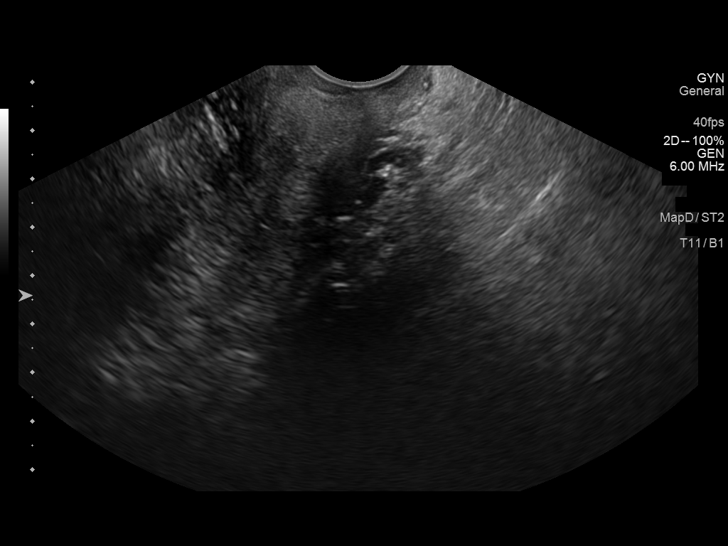
[im 89/89]
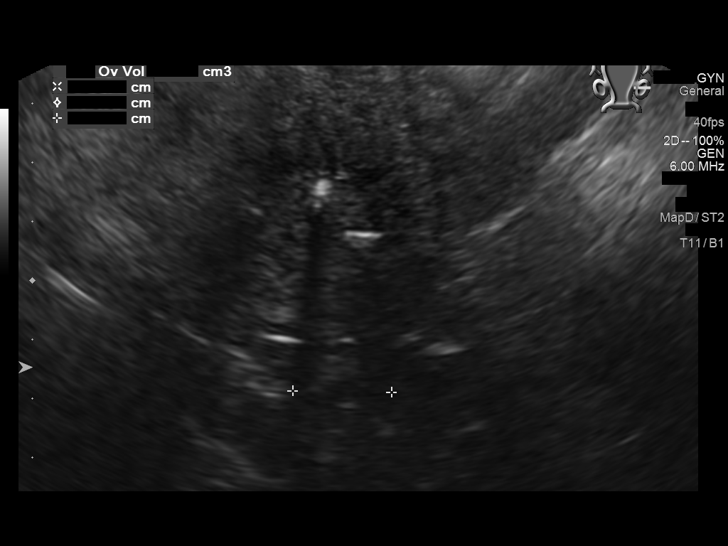

[14 of 25 positions shown; findings below may reference images not displayed]

FINDINGS: Uterus

Measurements: 8.2 x 3.9 x 5.4 cm = volume: 90 mL. Anteverted. Normal
morphology without mass

Endometrium

Thickness: 4 mm. IUD in expected position at the upper uterine
segment endometrial canal. No endometrial fluid.

Right ovary

Measurements: 3.0 x 2.0 x 2.2 cm = volume: 6.9 mL. Normal morphology
without mass

Left ovary

Measurements: 3.0 x 1.6 x 2.5 cm = volume: 6.4 mL. Normal morphology
without mass

Other findings

Trace free pelvic fluid.  No adnexal masses.
IMPRESSION: IUD in expected position at the upper uterine segment endometrial
canal.

Otherwise negative exam.

## 2021-11-04 ENCOUNTER — Other Ambulatory Visit: Payer: Self-pay | Admitting: Family Medicine

## 2021-11-04 NOTE — Telephone Encounter (Signed)
Requested medication (s) are due for refill today: yes   Requested medication (s) are on the active medication list: yes  Last refill:  08/11/21 #90 1 refill  Future visit scheduled: yes in 3 months   Notes to clinic:  protocol failed. Last labs 03/02/21. Do you want to continue refills?     Requested Prescriptions  Pending Prescriptions Disp Refills   lisinopril (ZESTRIL) 20 MG tablet [Pharmacy Med Name: LISINOPRIL '20MG'$  TABLETS] 30 tablet     Sig: TAKE 1 TABLET(20 MG) BY MOUTH DAILY     Cardiovascular:  ACE Inhibitors Failed - 11/04/2021  3:38 AM      Failed - Cr in normal range and within 180 days    Creatinine, Ser  Date Value Ref Range Status  03/02/2021 0.59 0.44 - 1.00 mg/dL Final         Failed - K in normal range and within 180 days    Potassium  Date Value Ref Range Status  03/02/2021 3.2 (L) 3.5 - 5.1 mmol/L Final         Passed - Patient is not pregnant      Passed - Last BP in normal range    BP Readings from Last 1 Encounters:  09/29/21 107/64         Passed - Valid encounter within last 6 months    Recent Outpatient Visits           2 months ago Primary hypertension   [place], Megan P, DO   3 months ago Primary hypertension   [place], Megan P, DO   5 months ago Routine general medical examination at a health care facility   [facility], [place], DO   5 months ago Port-A-Cath in place   [employer], [place], DO   10 months ago Depression, recurrent [facility])   [place], [place], DO       Future Appointments             In 3 months Johnson, Barb Merino, DO [employer]

## 2021-12-03 ENCOUNTER — Other Ambulatory Visit: Payer: Self-pay | Admitting: Family Medicine

## 2021-12-04 ENCOUNTER — Other Ambulatory Visit: Payer: Self-pay | Admitting: Family Medicine

## 2021-12-04 DIAGNOSIS — I10 Essential (primary) hypertension: Secondary | ICD-10-CM

## 2021-12-05 NOTE — Telephone Encounter (Signed)
This is not current dosing for this medication Requested Prescriptions  Pending Prescriptions Disp Refills  . metoprolol tartrate (LOPRESSOR) 50 MG tablet [Pharmacy Med Name: METOPROLOL TARTRATE '50MG'$  TABLETS] 180 tablet 1    Sig: TAKE 1 TABLET(50 MG) BY MOUTH TWICE DAILY     Cardiovascular:  Beta Blockers Passed - 12/04/2021  1:05 PM      Passed - Last BP in normal range    BP Readings from Last 1 Encounters:  09/29/21 107/64         Passed - Last Heart Rate in normal range    Pulse Readings from Last 1 Encounters:  09/29/21 68         Passed - Valid encounter within last 6 months    Recent Outpatient Visits          3 months ago Primary hypertension   [facility] [place], [place], DO   4 months ago Primary hypertension   [place], Megan P, DO   6 months ago Routine general medical examination at a health care facility   [facility], [place], DO   6 months ago Port-A-Cath in place   [place], DO   11 months ago Depression, recurrent [facility])   [place], [place], DO      Future Appointments            In 2 months Johnson, Barb Merino, DO [employer], [facility]

## 2021-12-05 NOTE — Telephone Encounter (Signed)
Requested Prescriptions  Pending Prescriptions Disp Refills  . citalopram (CELEXA) 20 MG tablet [Pharmacy Med Name: CITALOPRAM '20MG'$  TABLETS] 90 tablet 0    Sig: TAKE 1 TABLET(20 MG) BY MOUTH DAILY     Psychiatry:  Antidepressants - SSRI Passed - 12/03/2021  3:36 AM      Passed - Completed PHQ-2 or PHQ-9 in the last 360 days      Passed - Valid encounter within last 6 months    Recent Outpatient Visits          3 months ago Primary hypertension   [place], Megan P, DO   4 months ago Primary hypertension   [place], Megan P, DO   6 months ago Routine general medical examination at a health care facility   [facility], [place], DO   6 months ago Port-A-Cath in place   [place], DO   11 months ago Depression, recurrent [facility])   [place], [place], DO      Future Appointments            In 2 months Johnson, Barb Merino, DO [employer], [facility]

## 2022-01-27 ENCOUNTER — Other Ambulatory Visit: Payer: Self-pay | Admitting: Family Medicine

## 2022-01-28 ENCOUNTER — Other Ambulatory Visit: Payer: Self-pay | Admitting: Family Medicine

## 2022-01-30 NOTE — Telephone Encounter (Signed)
Rx- pantoprazole - 09/13/21 #90 1RF- too soon metoprolol- not on this dose Requested Prescriptions  Pending Prescriptions Disp Refills  . pantoprazole (PROTONIX) 40 MG tablet [Pharmacy Med Name: PANTOPRAZOLE '40MG'$  TABLETS] 60 tablet     Sig: TAKE 1 TABLET(40 MG) BY MOUTH DAILY     Gastroenterology: Proton Pump Inhibitors Passed - 01/28/2022  2:43 PM      Passed - Valid encounter within last 12 months    Recent Outpatient Visits          5 months ago Primary hypertension   [place], Megan P, DO   6 months ago Primary hypertension   [place], Megan P, DO   8 months ago Routine general medical examination at a health care facility   [facility], [place], DO   8 months ago Port-A-Cath in place   [employer], [place], DO   1 year ago Depression, recurrent ([place])   [place], [place], DO      Future Appointments            In 2 weeks Johnson, Megan P, DO [place], [facility]           . metoprolol succinate (TOPROL-XL) 50 MG 24 hr tablet [Pharmacy Med Name: METOPROLOL ER SUCCINATE '50MG'$  TABS] 90 tablet 1    Sig: TAKE 1 TABLET(50 MG) BY MOUTH DAILY WITH OR IMMEDIATELY FOLLOWING A MEAL     Cardiovascular:  Beta Blockers Passed - 01/28/2022  2:43 PM      Passed - Last BP in normal range    BP Readings from Last 1 Encounters:  09/29/21 107/64         Passed - Last Heart Rate in normal range    Pulse Readings from Last 1 Encounters:  09/29/21 68         Passed - Valid encounter within last 6 months    Recent Outpatient Visits          5 months ago Primary hypertension   [facility] [place], Megan P, DO   6 months ago Primary hypertension   [facility] [place], Megan P, DO   8 months ago Routine general medical examination at a health care facility   [facility], [place], DO   8 months ago Port-A-Cath in place    [employer], [place], DO   1 year ago Depression, recurrent [facility])   [place], [place], DO      Future Appointments            In 2 weeks Wynetta Emery, Barb Merino, DO [employer], [facility]

## 2022-01-30 NOTE — Telephone Encounter (Signed)
Requested Prescriptions  Pending Prescriptions Disp Refills  . metoprolol succinate (TOPROL-XL) 25 MG 24 hr tablet [Pharmacy Med Name: METOPROLOL ER SUCCINATE '25MG'$  TABS] 90 tablet 0    Sig: TAKE 1 TABLET(25 MG) BY MOUTH DAILY     Cardiovascular:  Beta Blockers Passed - 01/27/2022  6:01 PM      Passed - Last BP in normal range    BP Readings from Last 1 Encounters:  09/29/21 107/64         Passed - Last Heart Rate in normal range    Pulse Readings from Last 1 Encounters:  09/29/21 68         Passed - Valid encounter within last 6 months    Recent Outpatient Visits          5 months ago Primary hypertension   [facility] [place], [place], DO   6 months ago Primary hypertension   [place], Megan P, DO   8 months ago Routine general medical examination at a health care facility   [facility], [place], DO   8 months ago Port-A-Cath in place   [place], [place], DO   1 year ago Depression, recurrent [facility])   [place], Barb Merino, DO      Future Appointments            In 2 weeks Wynetta Emery, Barb Merino, DO [facility], [facility]

## 2022-02-06 ENCOUNTER — Encounter (INDEPENDENT_AMBULATORY_CARE_PROVIDER_SITE_OTHER): Payer: Self-pay

## 2022-02-08 HISTORY — PX: PORT-A-CATH REMOVAL: SHX5289

## 2022-02-13 ENCOUNTER — Ambulatory Visit: Payer: Medicaid Other | Admitting: Family Medicine

## 2022-02-14 ENCOUNTER — Ambulatory Visit (INDEPENDENT_AMBULATORY_CARE_PROVIDER_SITE_OTHER): Payer: Medicaid Other | Admitting: Family Medicine

## 2022-02-14 ENCOUNTER — Encounter: Payer: Self-pay | Admitting: Family Medicine

## 2022-02-14 VITALS — BP 146/94 | HR 65 | Temp 99.0°F | Ht 62.99 in | Wt 222.6 lb

## 2022-02-14 DIAGNOSIS — I10 Essential (primary) hypertension: Secondary | ICD-10-CM | POA: Diagnosis not present

## 2022-02-14 DIAGNOSIS — F339 Major depressive disorder, recurrent, unspecified: Secondary | ICD-10-CM | POA: Diagnosis not present

## 2022-02-14 DIAGNOSIS — F419 Anxiety disorder, unspecified: Secondary | ICD-10-CM

## 2022-02-14 DIAGNOSIS — Z95828 Presence of other vascular implants and grafts: Secondary | ICD-10-CM

## 2022-02-14 DIAGNOSIS — F1193 Opioid use, unspecified with withdrawal: Secondary | ICD-10-CM | POA: Diagnosis not present

## 2022-02-14 MED ORDER — METOPROLOL SUCCINATE ER 25 MG PO TB24: ORAL_TABLET | ORAL | 1 refills | Status: DC

## 2022-02-14 MED ORDER — CITALOPRAM HYDROBROMIDE 20 MG PO TABS: ORAL_TABLET | ORAL | 1 refills | Status: DC

## 2022-02-14 MED ORDER — ALBUTEROL SULFATE HFA 108 (90 BASE) MCG/ACT IN AERS: 2.0000 | INHALATION_SPRAY | Freq: Four times a day (QID) | RESPIRATORY_TRACT | 2 refills | Status: DC | PRN

## 2022-02-14 MED ORDER — PANTOPRAZOLE SODIUM 40 MG PO TBEC: 40.0000 mg | DELAYED_RELEASE_TABLET | Freq: Every day | ORAL | 1 refills | Status: DC

## 2022-02-14 MED ORDER — LISINOPRIL 20 MG PO TABS: 20.0000 mg | ORAL_TABLET | Freq: Every day | ORAL | 1 refills | Status: DC

## 2022-02-14 NOTE — Assessment & Plan Note (Signed)
Needs port removed. Referral to general surgery placed today.

## 2022-02-14 NOTE — Assessment & Plan Note (Signed)
In exacerbation with withdrawal. Continue to monitor. Recheck 1 month. Call with any concerns.

## 2022-02-14 NOTE — Assessment & Plan Note (Signed)
Up slightly. Has been off her medicine for 2 days. Will restart and recheck 1 month. Call with any concerns.

## 2022-02-14 NOTE — Progress Notes (Signed)
BP (!) 146/94   Pulse 65   Temp 99 F (37.2 C) (Oral)   Ht 5' 2.99" (1.6 m)   Wt 222 lb 9.6 oz (101 kg)   SpO2 98%   BMI 39.44 kg/m    Subjective:    Patient ID: Katie Stanley, female    DOB: October 08, 1985, 36 y.o.   MRN: [id number]  HPI: [name] is a 36 y.o. female  Chief Complaint  Patient presents with   Hypertension   Depression   Her suboxone doctor has gone out of business. She got 30 days on her medication and was told to find another provider. She does not want to stay on suboxone and would like to come off of it. She has been weaning down, but has not has any in 2 days. She has been has been having diarrhea, nausea, anxiety, sweats and chills. No seizures. She is feeling much better today than she was yesterday.  HYPERTENSION- did not take her medicine today or yesterday Hypertension status: stable  Satisfied with current treatment? yes Duration of hypertension: chronic BP monitoring frequency:  not checking BP medication side effects:  no Medication compliance: fair compliance Previous BP meds:metoprolol, lisinopril Aspirin: no Recurrent headaches: no Visual changes: no Palpitations: no Dyspnea: no Chest pain: no Lower extremity edema: no Dizzy/lightheaded: no  DEPRESSION- has been coming off her suboxone and that has been messing with her mood Mood status: exacerbated Satisfied with current treatment?: yes Symptom severity: moderate  Duration of current treatment : chronic Side effects: no Medication compliance: excellent compliance Psychotherapy/counseling: no  Previous psychiatric medications: celexa Depressed mood: yes Anxious mood: yes Anhedonia: no Significant weight loss or gain: no Insomnia: no  Fatigue: yes Feelings of worthlessness or guilt: no Impaired concentration/indecisiveness: no Suicidal ideations: no Hopelessness: no Crying spells: no    02/14/2022    3:16 PM 08/11/2021    1:52 PM 07/11/2021    1:43 PM 06/02/2021     1:26 PM 05/16/2021    3:16 PM  Depression screen PHQ 2/9  Decreased Interest 2 1 0 1 0  Down, Depressed, Hopeless 1 0 0 0 0  PHQ - 2 Score 3 1 0 1 0  Altered sleeping '3 1 2 2 2  '$ Tired, decreased energy '2 1 2 2 2  '$ Change in appetite 2 0 '2 2 2  '$ Feeling bad or failure about yourself  2 0 1 1 0  Trouble concentrating 0 0 0 0 0  Moving slowly or fidgety/restless 0 0 0 0 0  Suicidal thoughts  0 0 0 0  PHQ-9 Score '12 3 7 8 6  '$ Difficult doing work/chores Somewhat difficult         Relevant past medical, surgical, family and social history reviewed and updated as indicated. Interim medical history since our last visit reviewed. Allergies and medications reviewed and updated.  Review of Systems  Constitutional:  Positive for chills and diaphoresis. Negative for activity change, appetite change, fatigue, fever and unexpected weight change.  HENT: Negative.    Respiratory: Negative.    Cardiovascular: Negative.   Gastrointestinal:  Positive for abdominal pain, diarrhea, nausea and vomiting. Negative for abdominal distention, anal bleeding, blood in stool, constipation and rectal pain.  Musculoskeletal: Negative.   Psychiatric/Behavioral: Negative.      Per HPI unless specifically indicated above     Objective:    BP (!) 146/94   Pulse 65   Temp 99 F (37.2 C) (Oral)   Ht 5'  2.99" (1.6 m)   Wt 222 lb 9.6 oz (101 kg)   SpO2 98%   BMI 39.44 kg/m   Wt Readings from Last 3 Encounters:  02/14/22 222 lb 9.6 oz (101 kg)  09/29/21 216 lb (98 kg)  08/24/21 222 lb (100.7 kg)    Physical Exam Vitals and nursing note reviewed.  Constitutional:      General: She is not in acute distress.    Appearance: Normal appearance. She is obese. She is not ill-appearing, toxic-appearing or diaphoretic.  HENT:     Head: Normocephalic and atraumatic.     Right Ear: External ear normal.     Left Ear: External ear normal.     Nose: Nose normal.     Mouth/Throat:     Mouth: Mucous membranes are  moist.     Pharynx: Oropharynx is clear.  Eyes:     General: No scleral icterus.       Right eye: No discharge.        Left eye: No discharge.     Extraocular Movements: Extraocular movements intact.     Conjunctiva/sclera: Conjunctivae normal.     Pupils: Pupils are equal, round, and reactive to light.  Cardiovascular:     Rate and Rhythm: Normal rate and regular rhythm.     Pulses: Normal pulses.     Heart sounds: Normal heart sounds. No murmur heard.    No friction rub. No gallop.  Pulmonary:     Effort: Pulmonary effort is normal. No respiratory distress.     Breath sounds: Normal breath sounds. No stridor. No wheezing, rhonchi or rales.  Chest:     Chest wall: No tenderness.  Musculoskeletal:        General: Normal range of motion.     Cervical back: Normal range of motion and neck supple.  Skin:    General: Skin is warm and dry.     Capillary Refill: Capillary refill takes less than 2 seconds.     Coloration: Skin is not jaundiced or pale.     Findings: No bruising, erythema, lesion or rash.  Neurological:     General: No focal deficit present.     Mental Status: She is alert and oriented to person, place, and time. Mental status is at baseline.  Psychiatric:        Mood and Affect: Mood normal.        Behavior: Behavior normal.        Thought Content: Thought content normal.        Judgment: Judgment normal.     Results for orders placed or performed during the hospital encounter of 09/29/21  Pregnancy, urine POC  Result Value Ref Range   Preg Test, Ur NEGATIVE NEGATIVE  Surgical pathology  Result Value Ref Range   SURGICAL PATHOLOGY      SURGICAL PATHOLOGY CASE: [id number] PATIENT: Katie Stanley Surgical Pathology Report     Specimen Submitted: A. Stomach; cbx  Clinical History: Epigastric pain. Finding: Gastritis     DIAGNOSIS: A. 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 - NEGATIVE FOR H. PYLORI BY  IMMUNOHISTOCHEMISTRY (IHC). - NEGATIVE FOR INTESTINAL METAPLASIA, DYSPLASIA, AND MALIGNANCY.  Comment: IHC slides were prepared by Launa Grill, [place]. All controls stained appropriately.  This test was developed and its performance characteristics determined by [facility]. It has not been cleared or approved by the [place] Food and Drug Administration. The FDA does not require this test to go through premarket FDA  review. This test is used for clinical purposes. It should not be regarded as investigational or for research. This laboratory is certified under the Clinical Laboratory Improvement Amendments (CLIA) as qualified to perform high complexity cl inical laboratory testing.  GROSS DESCRIPTION: A. Labeled: Cbx gastric Received: Formalin Collection time: 8:23 AM on 09/29/2021 Placed into formalin time: 8:23 AM on 09/29/2021 Tissue fragment(s): 2 Size: Ranges from 0.2-0.3 cm Description: Pink soft tissue fragments Entirely submitted in 1 cassette.  CM 09/29/2021  Final Diagnosis performed by Bryan Lemma, MD.   Electronically signed 10/03/2021 1:34:42PM The electronic signature indicates that the named Attending Pathologist has evaluated the specimen Technical component performed at [place], [address], [place], [place] [address] Lab: [phone] Dir: Rush Farmer, MD, MMM  Professional component performed at [facility], [facility], [place], [place], [place] [address] Lab: [phone] Dir: Kathi Simpers, MD       Assessment & Plan:   Problem List Items Addressed This Visit       Cardiovascular and Mediastinum   Primary hypertension    Up slightly. Has been off her medicine for 2 days. Will restart and recheck 1 month. Call with any concerns.       Relevant Medications   lisinopril (ZESTRIL) 20 MG tablet   metoprolol succinate (TOPROL-XL) 25 MG 24 hr tablet     Other   Anxiety    In exacerbation with withdrawal. Continue to monitor. Recheck  1 month. Call with any concerns.       Relevant Medications   citalopram (CELEXA) 20 MG tablet   Depression, recurrent (HCC)    In exacerbation with withdrawal. Continue to monitor. Recheck 1 month. Call with any concerns.       Relevant Medications   citalopram (CELEXA) 20 MG tablet   Port-A-Cath in place    Needs port removed. Referral to general surgery placed today.       Relevant Orders   Ambulatory referral to General Surgery   Other Visit Diagnoses     Opioid withdrawal ([place])    -  Primary   Has weaned down on her suboxone, but has been having some withdrawal symptoms. Doing OK right now. Warning signs to go to ER discussed today. Call with concerns        Follow up plan: Return in about 4 weeks (around 03/14/2022) for physical.

## 2022-02-20 ENCOUNTER — Ambulatory Visit: Payer: Medicaid Other | Admitting: Surgery

## 2022-02-20 ENCOUNTER — Encounter: Payer: Self-pay | Admitting: Surgery

## 2022-02-20 VITALS — BP 133/85 | HR 114 | Temp 99.2°F | Ht 63.0 in | Wt 215.0 lb

## 2022-02-20 DIAGNOSIS — Z452 Encounter for adjustment and management of vascular access device: Secondary | ICD-10-CM | POA: Diagnosis not present

## 2022-02-20 DIAGNOSIS — Z95828 Presence of other vascular implants and grafts: Secondary | ICD-10-CM

## 2022-02-20 NOTE — Progress Notes (Signed)
02/20/2022  Reason for Visit: Port-A-Cath removal  Requesting Provider: Park Liter, DO  History of Present Illness: Katie Stanley is a 36 y.o. female presenting for consultation for Port-A-Cath removal.  The patient had a right IJ Port-A-Cath insertion on 01/01/2019 by Dr. Lucky Cowboy due to poor peripheral veins for blood draws.  Earlier this year, the patient felt that the Port-A-Cath was creating a swelling in her neck and wanted the Port-A-Cath removed and exchanged.  Ultrasound of the neck at that point was negative for any complications from the Port-A-Cath and the Port-A-Cath was kept in place.  She saw her PCP for follow-up on 02/14/2022 and she was referred to [place] for Port-A-Cath excision.  Patient denies any neck pain, issues with the port, erythema, induration, or drainage.  Past Medical History: Past Medical History:  Diagnosis Date   Anxiety    Biliary calculi 12/07/2009   Cholelithiasis    Depression    GERD (gastroesophageal reflux disease)    Hepatitis C    body "self healed" per patient   Palpitations    Retained intrauterine contraceptive device (IUD) 01/20/2020   Substance abuse ([place])      Past Surgical History: Past Surgical History:  Procedure Laterality Date   CHOLECYSTECTOMY     2011   ESOPHAGOGASTRODUODENOSCOPY N/A 09/29/2021   Procedure: ESOPHAGOGASTRODUODENOSCOPY (EGD);  Surgeon: Lucilla Lame, MD;  Location: [facility];  Service: Endoscopy;  Laterality: N/A;   ESOPHAGOGASTRODUODENOSCOPY (EGD) WITH PROPOFOL N/A 01/21/2018   Procedure: ESOPHAGOGASTRODUODENOSCOPY (EGD) WITH Biopsy;  Surgeon: Lucilla Lame, MD;  Location: [facility];  Service: Endoscopy;  Laterality: N/A;   HYSTEROSCOPY WITH D & C N/A 01/20/2020   Procedure: DILATATION AND CURETTAGE /HYSTEROSCOPY;  Surgeon: Will Bonnet, MD;  Location: [facility] ORS;  Service: Gynecology;  Laterality: N/A;   INTRAUTERINE DEVICE (IUD) INSERTION N/A 01/20/2020   Procedure: INTRAUTERINE DEVICE (IUD)  INSERTION MIRENA IUD;  Surgeon: Will Bonnet, MD;  Location: [facility] ORS;  Service: Gynecology;  Laterality: N/A;   IUD REMOVAL N/A 01/20/2020   Procedure: INTRAUTERINE DEVICE (IUD) REMOVAL;  Surgeon: Will Bonnet, MD;  Location: [facility] ORS;  Service: Gynecology;  Laterality: N/A;   PORTA CATH INSERTION N/A 01/01/2019   Procedure: PORTA CATH INSERTION;  Surgeon: Algernon Huxley, MD;  Location: [place] [facility];  Service: Cardiovascular;  Laterality: N/A;    Home Medications: Prior to Admission medications   Medication Sig Start Date End Date Taking? Authorizing Provider  albuterol (VENTOLIN HFA) 108 (90 Base) MCG/ACT inhaler Inhale 2 puffs into the lungs every 6 (six) hours as needed for wheezing or shortness of breath. 02/14/22  Yes Johnson, Megan P, DO  citalopram (CELEXA) 20 MG tablet TAKE 1 TABLET(20 MG) BY MOUTH DAILY 02/14/22  Yes Johnson, Megan P, DO  lisinopril (ZESTRIL) 20 MG tablet Take 1 tablet (20 mg total) by mouth daily. 02/14/22  Yes Johnson, Megan P, DO  metoprolol succinate (TOPROL-XL) 25 MG 24 hr tablet TAKE 1 TABLET(25 MG) BY MOUTH DAILY 02/14/22  Yes Johnson, Megan P, DO  Naphazoline HCl (CLEAR EYES OP) Place 1 drop into both eyes daily as needed (itching).   Yes [provider]  pantoprazole (PROTONIX) 40 MG tablet Take 1 tablet (40 mg total) by mouth daily. 02/14/22  Yes Johnson, Megan P, DO    Allergies: Allergies  Allergen Reactions   Penicillins Swelling    Social History:  reports that she has been smoking cigarettes. She has a 12.00 pack-year smoking history. She has never used  smokeless tobacco. She reports current alcohol use. She reports current drug use. Drug: Marijuana.   Family History: Family History  Problem Relation Age of Onset   Hypertension Mother    Diabetes Father    Hypertension Father    Liver cancer Maternal Grandmother    Hypertension Maternal Grandfather    Breast cancer Paternal Grandmother    Heart disease Paternal  Grandfather     Review of Systems: Review of Systems  Constitutional:  Negative for chills and fever.  Respiratory:  Negative for shortness of breath.   Cardiovascular:  Negative for chest pain.  Gastrointestinal:  Negative for abdominal pain, nausea and vomiting.  Skin:  Negative for rash.    Physical Exam BP 133/85   Pulse (!) 114   Temp 99.2 F (37.3 C) (Oral)   Ht '5\' 3"'$  (1.6 m)   Wt 215 lb (97.5 kg)   SpO2 98%   BMI 38.09 kg/m  CONSTITUTIONAL: No acute distress, well-nourished HEENT:  Normocephalic, atraumatic, extraocular motion intact. NECK: Patient has well-healed scar tissue from Port-A-Cath insertion both at the upper chest the port itself is located and the right lower neck where the catheter inserts into the IJ.  No evidence of hematoma or other complications to explain swelling. RESPIRATORY:  Normal respiratory effort without pathologic use of accessory muscles. CARDIOVASCULAR: Regular rhythm and rate. MUSCULOSKELETAL:  Normal muscle strength and tone in all four extremities.  No peripheral edema or cyanosis. NEUROLOGIC:  Motor and sensation is grossly normal.  Cranial nerves are grossly intact. PSYCH:  Alert and oriented to person, place and time. Affect is normal.  Imaging: Ultrasound soft tissue of the neck on 05/30/2021: IMPRESSION: No significant soft tissue neck abnormality by ultrasound.  Ultrasound venous right upper extremity on 07/25/2021: IMPRESSION: No evidence of DVT within the right upper extremity with special attention paid to the right internal jugular vein.   Assessment and Plan: This is a 36 y.o. female with a right IJ Port-A-Cath, now in need to remove this.  - Discussed with patient that we can remove the Port-A-Cath here in the office.  Reviewed with her the procedure at length including the use of local anesthesia, incision, risk of bleeding, infection, injury to surrounding structures, postoperative pain control, and she is willing to  proceed   Procedure Date:  02/20/2022  Pre-operative Diagnosis: Port-A-Cath in place  Post-operative Diagnosis: Port-A-Cath in place  Procedure: Removal of right IJ Port-A-Cath  Surgeon:  Melvyn Neth, MD  Anesthesia: 10 mL of 1% lidocaine with epi  Estimated Blood Loss:  5 ml  Specimens: None  Complications: None  Indications for Procedure:  This is a 36 y.o. female with a history of prior Port-A-Cath placement in 2020, now presenting for removal.  The risks of bleeding, infection, injury to surrounding structures were discussed with the patient and she was willing to proceed.  Description of Procedure: The patient was correctly identified at bedside.  The patient was placed supine.  Appropriate time-outs were performed.  The right chest and neck were prepped and draped in usual sterile fashion.  Local anesthetic was infiltrated into the skin and subcutaneous tissue over the prior incision for the Port-A-Cath placement.  An incision was made over the prior incision with a scalpel and this was carried down into subcutaneous tissue where the port was located.  Skin flaps were created and the capsule surrounding the port was incised and excised.  Sutures holding the port in place were removed.  The port was  then pulled out of the pocket and while placing manual pressure at the insertion site in the right IJ, the port and catheter were removed together intact.  Manual pressure was held for 5 minutes and afterwards there is no bleeding or hematoma formation.  The rest of the port capsule was excised using scalpel and then the wound bed was irrigated using saline.  Hemostasis was good with only manual pressure.  The incision was then closed in 2 layers using 3-0 Vicryl and 4-0 Monocryl.  The wound was then cleaned and sealed with Dermabond.  The patient tolerated the procedure well and all sharps were appropriately disposed of at the end of the case.  -Patient may take Tylenol or  ibuprofen for pain control. - Patient may shower tomorrow. - May apply ice pack over the wound to decrease swelling. - Follow-up in 10 days for wound check.  I spent 30 minutes dedicated to the care of this patient on the date of this encounter to include pre-visit review of records, face-to-face time with the patient discussing diagnosis and management, and any post-visit coordination of care.   Melvyn Neth, [facility]

## 2022-02-20 NOTE — Patient Instructions (Addendum)
If you have any concerns or questions, please feel free to call our office. See follow up appointment below.   Implanted Port Removal, Care After The following information offers guidance on how to care for yourself after your procedure. Your health care provider may also give you more specific instructions. If you have problems or questions, contact your health care provider. What can I expect after the procedure? After the procedure, it is common to have: Soreness or pain near your incision. Some swelling or bruising near your incision. Follow these instructions at home: Medicines Take over-the-counter and prescription medicines only as told by your health care provider. If you were prescribed an antibiotic medicine, take it as told by your health care provider. Do not stop taking the antibiotic even if you start to feel better. Bathing Do not take baths, swim, or use a hot tub until your health care provider approves. Ask your health care provider if you can take showers. You may only be allowed to take sponge baths. Incision care  Follow instructions from your health care provider about how to take care of your incision. Make sure you: Wash your hands with soap and water for at least 20 seconds before and after you change your bandage (dressing). If soap and water are not available, use hand sanitizer. Change your dressing as told by your health care provider. Keep your dressing dry. Leave stitches (sutures), skin glue, or adhesive strips in place. These skin closures may need to stay in place for 2 weeks or longer. If adhesive strip edges start to loosen and curl up, you may trim the loose edges. Do not remove adhesive strips completely unless your health care provider tells you to do that. Check your incision area every day for signs of infection. Check for: More redness, swelling, or pain. More fluid or blood. Warmth. Pus or a bad smell. Activity Return to your normal activities as  told by your health care provider. Ask your health care provider what activities are safe for you. You may have to avoid lifting. Ask your health care provider how much you can safely lift. Do not do activities that involve lifting your arms over your head. Driving  If you were given a sedative during the procedure, it can affect you for several hours. Do not drive or operate machinery until your health care provider says that it is safe. If you did not receive a sedative, ask your health care provider when it is safe to drive. General instructions Do not use any products that contain nicotine or tobacco. These products include cigarettes, chewing tobacco, and vaping devices, such as e-cigarettes. These can delay healing after surgery. If you need help quitting, ask your health care provider. Keep all follow-up visits. This is important. Contact a health care provider if: You have a fever or chills. You have more redness, swelling, or pain around your incision. You have more fluid or blood coming from your incision. Your incision feels warm to the touch. You have pus or a bad smell coming from your incision. You have pain that is not relieved by your pain medicine. Get help right away if: You have chest pain. You have difficulty breathing. These symptoms may be an emergency. Get help right away. Call 911. Do not wait to see if the symptoms will go away. Do not drive yourself to the hospital. Summary After the procedure, it is common to have pain, soreness, swelling, or bruising near your incision. If you were prescribed  an antibiotic medicine, take it as told by your health care provider. Do not stop taking the antibiotic even if you start to feel better. If you were given a sedative during the procedure, it can affect you for several hours. Do not drive or operate machinery until your health care provider says that it is safe. Return to your normal activities as told by your health care  provider. Ask your health care provider what activities are safe for you. This information is not intended to replace advice given to you by your health care provider. Make sure you discuss any questions you have with your health care provider. Document Revised: 09/28/2020 Document Reviewed: 09/28/2020 Elsevier Patient Education  [place].

## 2022-02-23 ENCOUNTER — Encounter: Payer: Self-pay | Admitting: Family Medicine

## 2022-03-01 ENCOUNTER — Ambulatory Visit (INDEPENDENT_AMBULATORY_CARE_PROVIDER_SITE_OTHER): Payer: Medicaid Other | Admitting: Surgery

## 2022-03-01 ENCOUNTER — Encounter: Payer: Self-pay | Admitting: Surgery

## 2022-03-01 VITALS — BP 154/97 | HR 98 | Temp 99.2°F | Wt 210.4 lb

## 2022-03-01 DIAGNOSIS — Z452 Encounter for adjustment and management of vascular access device: Secondary | ICD-10-CM

## 2022-03-01 DIAGNOSIS — Z95828 Presence of other vascular implants and grafts: Secondary | ICD-10-CM

## 2022-03-01 DIAGNOSIS — Z09 Encounter for follow-up examination after completed treatment for conditions other than malignant neoplasm: Secondary | ICD-10-CM

## 2022-03-01 NOTE — Patient Instructions (Signed)
You may try Vitamin E oil and doing neck stretches.   If you have any concerns or questions, please feel free to call our office.   Implanted Port Removal, Care After The following information offers guidance on how to care for yourself after your procedure. Your health care provider may also give you more specific instructions. If you have problems or questions, contact your health care provider. What can I expect after the procedure? After the procedure, it is common to have: Soreness or pain near your incision. Some swelling or bruising near your incision. Follow these instructions at home: Medicines Take over-the-counter and prescription medicines only as told by your health care provider. If you were prescribed an antibiotic medicine, take it as told by your health care provider. Do not stop taking the antibiotic even if you start to feel better. Bathing Do not take baths, swim, or use a hot tub until your health care provider approves. Ask your health care provider if you can take showers. You may only be allowed to take sponge baths. Incision care  Follow instructions from your health care provider about how to take care of your incision. Make sure you: Wash your hands with soap and water for at least 20 seconds before and after you change your bandage (dressing). If soap and water are not available, use hand sanitizer. Change your dressing as told by your health care provider. Keep your dressing dry. Leave stitches (sutures), skin glue, or adhesive strips in place. These skin closures may need to stay in place for 2 weeks or longer. If adhesive strip edges start to loosen and curl up, you may trim the loose edges. Do not remove adhesive strips completely unless your health care provider tells you to do that. Check your incision area every day for signs of infection. Check for: More redness, swelling, or pain. More fluid or blood. Warmth. Pus or a bad smell. Activity Return to  your normal activities as told by your health care provider. Ask your health care provider what activities are safe for you. You may have to avoid lifting. Ask your health care provider how much you can safely lift. Do not do activities that involve lifting your arms over your head. Driving  If you were given a sedative during the procedure, it can affect you for several hours. Do not drive or operate machinery until your health care provider says that it is safe. If you did not receive a sedative, ask your health care provider when it is safe to drive. General instructions Do not use any products that contain nicotine or tobacco. These products include cigarettes, chewing tobacco, and vaping devices, such as e-cigarettes. These can delay healing after surgery. If you need help quitting, ask your health care provider. Keep all follow-up visits. This is important. Contact a health care provider if: You have a fever or chills. You have more redness, swelling, or pain around your incision. You have more fluid or blood coming from your incision. Your incision feels warm to the touch. You have pus or a bad smell coming from your incision. You have pain that is not relieved by your pain medicine. Get help right away if: You have chest pain. You have difficulty breathing. These symptoms may be an emergency. Get help right away. Call 911. Do not wait to see if the symptoms will go away. Do not drive yourself to the hospital. Summary After the procedure, it is common to have pain, soreness, swelling, or bruising  near your incision. If you were prescribed an antibiotic medicine, take it as told by your health care provider. Do not stop taking the antibiotic even if you start to feel better. If you were given a sedative during the procedure, it can affect you for several hours. Do not drive or operate machinery until your health care provider says that it is safe. Return to your normal activities as  told by your health care provider. Ask your health care provider what activities are safe for you. This information is not intended to replace advice given to you by your health care provider. Make sure you discuss any questions you have with your health care provider. Document Revised: 09/28/2020 Document Reviewed: 09/28/2020 Elsevier Patient Education  [place].

## 2022-03-01 NOTE — Progress Notes (Signed)
03/01/2022  HPI: Katie Stanley is a 36 y.o. female s/p excision of right IJ port-a-cath on 02/10/22.  She presents today for follow-up.  She reports that she is having little bit of soreness in the right neck area but otherwise denies any worsening pain or issues with the incision.  Vital signs: BP (!) 154/97   Pulse 98   Temp 99.2 F (37.3 C) (Oral)   Wt 210 lb 6.4 oz (95.4 kg)   SpO2 95%   BMI 37.27 kg/m    Physical Exam: Constitutional: No acute distress Skin: Right upper chest incision is healing well and is clean, dry, intact with Dermabond in place.  No significant swelling, erythema, or ecchymosis.  The patient still has discomfort at the site where the catheter inserts into the internal jugular vein.  The capsule surrounding the catheter is still palpable but otherwise there is no evidence of any retained foreign body.  Assessment/Plan: This is a 35 y.o. female s/p excision of right IJ Port-A-Cath.  - Discussed with patient that the scar that she is feeling is related to the capsule that formed around the catheter.  The catheter itself was removed intact with no complications during the procedure.  The scar may start improving as time goes although she may always feel some degree of capsule.  May apply vitamin E lotion to try to help ease up the scarring. - Otherwise the port site incisions healing well with no complications. - Follow-up as needed.   Melvyn Neth, [facility]

## 2022-03-14 ENCOUNTER — Encounter: Payer: Self-pay | Admitting: Family Medicine

## 2022-03-14 ENCOUNTER — Ambulatory Visit: Payer: Medicaid Other | Admitting: Family Medicine

## 2022-03-14 VITALS — BP 107/73 | HR 87 | Temp 98.4°F | Ht 63.9 in | Wt 209.2 lb

## 2022-03-14 DIAGNOSIS — G4700 Insomnia, unspecified: Secondary | ICD-10-CM

## 2022-03-14 DIAGNOSIS — Z0000 Encounter for general adult medical examination without abnormal findings: Secondary | ICD-10-CM

## 2022-03-14 MED ORDER — QUETIAPINE FUMARATE 25 MG PO TABS: 12.5000 mg | ORAL_TABLET | Freq: Every day | ORAL | 1 refills | Status: DC

## 2022-03-14 MED ORDER — CLOTRIMAZOLE-BETAMETHASONE 1-0.05 % EX CREA: 1.0000 | TOPICAL_CREAM | Freq: Every day | CUTANEOUS | 0 refills | Status: DC

## 2022-03-14 NOTE — Progress Notes (Signed)
BP 107/73   Pulse 87   Temp 98.4 F (36.9 C) (Oral)   Ht 5' 3.9" (1.623 m)   Wt 209 lb 3.2 oz (94.9 kg)   SpO2 95%   BMI 36.02 kg/m    Subjective:    Patient ID: Katie Stanley, female    DOB: 1986/03/04, 36 y.o.   MRN: [id number]  HPI: Katie Stanley is a 36 y.o. female presenting on 03/14/2022 for comprehensive medical examination. Current medical complaints include:  Has not been able to sleep more than 3 hours and then has been very tired. She feels like it got worse after she came off the suboxone. She notes that within the past couple of weeks that started acting up again.   She currently lives with: boyfriend and son Menopausal Symptoms: no  Depression Screen done today and results listed below:     03/14/2022    2:54 PM 02/14/2022    3:16 PM 08/11/2021    1:52 PM 07/11/2021    1:43 PM 06/02/2021    1:26 PM  Depression screen PHQ 2/9  Decreased Interest '1 2 1 '$ 0 1  Down, Depressed, Hopeless 0 1 0 0 0  PHQ - 2 Score '1 3 1 '$ 0 1  Altered sleeping '3 3 1 2 2  '$ Tired, decreased energy '3 2 1 2 2  '$ Change in appetite 3 2 0 2 2  Feeling bad or failure about yourself  1 2 0 1 1  Trouble concentrating 0 0 0 0 0  Moving slowly or fidgety/restless 0 0 0 0 0  Suicidal thoughts 0  0 0 0  PHQ-9 Score '11 12 3 7 8  '$ Difficult doing work/chores Very difficult Somewhat difficult       Past Medical History:  Past Medical History:  Diagnosis Date   Anxiety    Biliary calculi 12/07/2009   Cholelithiasis    Depression    GERD (gastroesophageal reflux disease)    Hepatitis C    body "self healed" per patient   Palpitations    Retained intrauterine contraceptive device (IUD) 01/20/2020   Substance abuse ([place])     Surgical History:  Past Surgical History:  Procedure Laterality Date   CHOLECYSTECTOMY     2011   ESOPHAGOGASTRODUODENOSCOPY N/A 09/29/2021   Procedure: ESOPHAGOGASTRODUODENOSCOPY (EGD);  Surgeon: Lucilla Lame, MD;  Location: [facility]COPY;  Service: Endoscopy;   Laterality: N/A;   ESOPHAGOGASTRODUODENOSCOPY (EGD) WITH PROPOFOL N/A 01/21/2018   Procedure: ESOPHAGOGASTRODUODENOSCOPY (EGD) WITH Biopsy;  Surgeon: Lucilla Lame, MD;  Location: [facility];  Service: Endoscopy;  Laterality: N/A;   HYSTEROSCOPY WITH D & C N/A 01/20/2020   Procedure: DILATATION AND CURETTAGE /HYSTEROSCOPY;  Surgeon: Will Bonnet, MD;  Location: [facility] ORS;  Service: Gynecology;  Laterality: N/A;   INTRAUTERINE DEVICE (IUD) INSERTION N/A 01/20/2020   Procedure: INTRAUTERINE DEVICE (IUD) INSERTION MIRENA IUD;  Surgeon: Will Bonnet, MD;  Location: [facility] ORS;  Service: Gynecology;  Laterality: N/A;   IUD REMOVAL N/A 01/20/2020   Procedure: INTRAUTERINE DEVICE (IUD) REMOVAL;  Surgeon: Will Bonnet, MD;  Location: [facility] ORS;  Service: Gynecology;  Laterality: N/A;   PORT-A-CATH REMOVAL  02/08/2022   PORTA CATH INSERTION N/A 01/01/2019   Procedure: PORTA CATH INSERTION;  Surgeon: Algernon Huxley, MD;  Location: [facility];  Service: Cardiovascular;  Laterality: N/A;    Medications:  Current Outpatient Medications on File Prior to Visit  Medication Sig   albuterol (VENTOLIN HFA) 108 (90 Base) MCG/ACT  inhaler Inhale 2 puffs into the lungs every 6 (six) hours as needed for wheezing or shortness of breath.   citalopram (CELEXA) 20 MG tablet TAKE 1 TABLET(20 MG) BY MOUTH DAILY   lisinopril (ZESTRIL) 20 MG tablet Take 1 tablet (20 mg total) by mouth daily.   metoprolol succinate (TOPROL-XL) 25 MG 24 hr tablet TAKE 1 TABLET(25 MG) BY MOUTH DAILY   Naphazoline HCl (CLEAR EYES OP) Place 1 drop into both eyes daily as needed (itching).   pantoprazole (PROTONIX) 40 MG tablet Take 1 tablet (40 mg total) by mouth daily.   No current facility-administered medications on file prior to visit.    Allergies:  Allergies  Allergen Reactions   Penicillins Swelling    Social History:  Social History   Socioeconomic History   Marital status: Single    Spouse  name: Not on file   Number of children: Not on file   Years of education: Not on file   Highest education level: Not on file  Occupational History   Not on file  Tobacco Use   Smoking status: Every Day    Packs/day: 1.00    Years: 12.00    Total pack years: 12.00    Types: Cigarettes   Smokeless tobacco: Never  Vaping Use   Vaping Use: Never used  Substance and Sexual Activity   Alcohol use: Yes    Comment: 6 BEERS SOME DAY, 20 shots per day, had some beers6/21/2023   Drug use: Yes    Types: Marijuana    Comment: 09/28/2021   Sexual activity: Yes    Birth control/protection: Implant, I.U.D.  Other Topics Concern   Not on file  Social History Narrative   Not on file   Social Determinants of Health   Financial Resource Strain: Not on file  Food Insecurity: Not on file  Transportation Needs: Not on file  Physical Activity: Not on file  Stress: Not on file  Social Connections: Not on file  Intimate Partner Violence: Not on file   Social History   Tobacco Use  Smoking Status Every Day   Packs/day: 1.00   Years: 12.00   Total pack years: 12.00   Types: Cigarettes  Smokeless Tobacco Never   Social History   Substance and Sexual Activity  Alcohol Use Yes   Comment: 6 BEERS SOME DAY, 20 shots per day, had some beers6/21/2023    Family History:  Family History  Problem Relation Age of Onset   Hypertension Mother    Diabetes Father    Hypertension Father    Liver cancer Maternal Grandmother    Hypertension Maternal Grandfather    Breast cancer Paternal Grandmother    Heart disease Paternal Grandfather     Past medical history, surgical history, medications, allergies, family history and social history reviewed with patient today and changes made to appropriate areas of the chart.   Review of Systems  Constitutional:  Positive for diaphoresis and malaise/fatigue. Negative for chills, fever and weight loss.  HENT:  Positive for sore throat. Negative for  congestion, ear discharge, ear pain, hearing loss, nosebleeds, sinus pain and tinnitus.   Eyes: Negative.   Respiratory: Negative.  Negative for stridor.   Cardiovascular:  Positive for palpitations. Negative for chest pain, orthopnea, claudication, leg swelling and PND.  Gastrointestinal:  Positive for diarrhea and heartburn. Negative for abdominal pain, blood in stool, constipation, melena, nausea and vomiting.  Genitourinary: Negative.   Musculoskeletal: Negative.   Skin:  Positive for rash. Negative for  itching.  Neurological:  Positive for tingling. Negative for dizziness, tremors, sensory change, speech change, focal weakness, seizures, loss of consciousness, weakness and headaches.  Endo/Heme/Allergies:  Positive for environmental allergies. Negative for polydipsia. Does not bruise/bleed easily.  Psychiatric/Behavioral:  Negative for depression, hallucinations, memory loss, substance abuse and suicidal ideas. The patient has insomnia. The patient is not nervous/anxious.    All other ROS negative except what is listed above and in the HPI.      Objective:    BP 107/73   Pulse 87   Temp 98.4 F (36.9 C) (Oral)   Ht 5' 3.9" (1.623 m)   Wt 209 lb 3.2 oz (94.9 kg)   SpO2 95%   BMI 36.02 kg/m   Wt Readings from Last 3 Encounters:  03/14/22 209 lb 3.2 oz (94.9 kg)  03/01/22 210 lb 6.4 oz (95.4 kg)  02/20/22 215 lb (97.5 kg)    Physical Exam Vitals and nursing note reviewed.  Constitutional:      General: She is not in acute distress.    Appearance: Normal appearance. She is obese. She is not ill-appearing, toxic-appearing or diaphoretic.  HENT:     Head: Normocephalic and atraumatic.     Right Ear: Tympanic membrane, ear canal and external ear normal. There is no impacted cerumen.     Left Ear: Tympanic membrane, ear canal and external ear normal. There is no impacted cerumen.     Nose: Nose normal. No congestion or rhinorrhea.     Mouth/Throat:     Mouth: Mucous membranes  are moist.     Pharynx: Oropharynx is clear. No oropharyngeal exudate or posterior oropharyngeal erythema.  Eyes:     General: No scleral icterus.       Right eye: No discharge.        Left eye: No discharge.     Extraocular Movements: Extraocular movements intact.     Conjunctiva/sclera: Conjunctivae normal.     Pupils: Pupils are equal, round, and reactive to light.  Neck:     Vascular: No carotid bruit.  Cardiovascular:     Rate and Rhythm: Normal rate and regular rhythm.     Pulses: Normal pulses.     Heart sounds: No murmur heard.    No friction rub. No gallop.  Pulmonary:     Effort: Pulmonary effort is normal. No respiratory distress.     Breath sounds: Normal breath sounds. No stridor. No wheezing, rhonchi or rales.  Chest:     Chest wall: No tenderness.  Abdominal:     General: Abdomen is flat. Bowel sounds are normal. There is no distension.     Palpations: Abdomen is soft. There is no mass.     Tenderness: There is no abdominal tenderness. There is no right CVA tenderness, left CVA tenderness, guarding or rebound.     Hernia: No hernia is present.  Genitourinary:    Comments: Breast and pelvic exams deferred with shared decision making Musculoskeletal:        General: No swelling, tenderness, deformity or signs of injury.     Cervical back: Normal range of motion and neck supple. No rigidity. No muscular tenderness.     Right lower leg: No edema.     Left lower leg: No edema.  Lymphadenopathy:     Cervical: No cervical adenopathy.  Skin:    General: Skin is warm and dry.     Capillary Refill: Capillary refill takes less than 2 seconds.     Coloration:  Skin is not jaundiced or pale.     Findings: No bruising, erythema, lesion or rash.  Neurological:     General: No focal deficit present.     Mental Status: She is alert and oriented to person, place, and time. Mental status is at baseline.     Cranial Nerves: No cranial nerve deficit.     Sensory: No sensory  deficit.     Motor: No weakness.     Coordination: Coordination normal.     Gait: Gait normal.     Deep Tendon Reflexes: Reflexes normal.  Psychiatric:        Mood and Affect: Mood normal.        Behavior: Behavior normal.        Thought Content: Thought content normal.        Judgment: Judgment normal.     Results for orders placed or performed during the hospital encounter of 09/29/21  Pregnancy, urine POC  Result Value Ref Range   Preg Test, Ur NEGATIVE NEGATIVE  Surgical pathology  Result Value Ref Range   SURGICAL PATHOLOGY      SURGICAL PATHOLOGY CASE: [id number] PATIENT: Keenan Bachelor Surgical Pathology Report     Specimen Submitted: A. Stomach; cbx  Clinical History: Epigastric pain. Finding: Gastritis     DIAGNOSIS: A. 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 - NEGATIVE FOR H. PYLORI BY IMMUNOHISTOCHEMISTRY (IHC). - NEGATIVE FOR INTESTINAL METAPLASIA, DYSPLASIA, AND MALIGNANCY.  Comment: IHC slides were prepared by Launa Grill, [place]. All controls stained appropriately.  This test was developed and its performance characteristics determined by [facility]. It has not been cleared or approved by the [place] Food and Drug Administration. The FDA does not require this test to go through premarket FDA review. This test is used for clinical purposes. It should not be regarded as investigational or for research. This laboratory is certified under the Clinical Laboratory Improvement Amendments (CLIA) as qualified to perform high complexity cl inical laboratory testing.  GROSS DESCRIPTION: A. Labeled: Cbx gastric Received: Formalin Collection time: 8:23 AM on 09/29/2021 Placed into formalin time: 8:23 AM on 09/29/2021 Tissue fragment(s): 2 Size: Ranges from 0.2-0.3 cm Description: Pink soft tissue fragments Entirely submitted in 1 cassette.  CM 09/29/2021  Final Diagnosis performed by Bryan Lemma, MD.    Electronically signed 10/03/2021 1:34:42PM The electronic signature indicates that the named Attending Pathologist has evaluated the specimen Technical component performed at [facility], [address], [place], [place] [address] Lab: [phone] Dir: Rush Farmer, MD, MMM  Professional component performed at [facility], [facility], [place], [place], [place] [address] Lab: [phone] Dir: Kathi Simpers, MD       Assessment & Plan:   Problem List Items Addressed This Visit   None Visit Diagnoses     Routine general medical examination at a health care facility    -  Primary   Vaccines up to date/declined. Screening labs checked previously. Pap up to date. Continue diet and exercise. Call with any concerns.   Insomnia, unspecified type       Will start low dose seroquel and recheck 1 month. Call with any concerns.        Follow up plan: Return in about 4 weeks (around 04/11/2022).   LABORATORY TESTING:  - Pap smear: up to date  IMMUNIZATIONS:   - Tdap: Tetanus vaccination status reviewed: last tetanus booster within 10 years. - Influenza: Refused - Pneumovax: Up to date - Prevnar: Not applicable -  COVID: Up to date  PATIENT COUNSELING:   Advised to take 1 mg of folate supplement per day if capable of pregnancy.   Sexuality: Discussed sexually transmitted diseases, partner selection, use of condoms, avoidance of unintended pregnancy  and contraceptive alternatives.   Advised to avoid cigarette smoking.  I discussed with the patient that most people either abstain from alcohol or drink within safe limits (<=14/week and <=4 drinks/occasion for males, <=7/weeks and <= 3 drinks/occasion for females) and that the risk for alcohol disorders and other health effects rises proportionally with the number of drinks per week and how often a drinker exceeds daily limits.  Discussed cessation/primary prevention of drug use and availability of treatment for  abuse.   Diet: Encouraged to adjust caloric intake to maintain  or achieve ideal body weight, to reduce intake of dietary saturated fat and total fat, to limit sodium intake by avoiding high sodium foods and not adding table salt, and to maintain adequate dietary potassium and calcium preferably from fresh fruits, vegetables, and low-fat dairy products.    stressed the importance of regular exercise  Injury prevention: Discussed safety belts, safety helmets, smoke detector, smoking near bedding or upholstery.   Dental health: Discussed importance of regular tooth brushing, flossing, and dental visits.    NEXT PREVENTATIVE PHYSICAL DUE IN 1 YEAR. Return in about 4 weeks (around 04/11/2022).

## 2022-03-22 ENCOUNTER — Encounter: Payer: Self-pay | Admitting: Emergency Medicine

## 2022-03-22 ENCOUNTER — Ambulatory Visit
Admission: EM | Admit: 2022-03-22 | Discharge: 2022-03-22 | Disposition: A | Discharge status: Home / Self Care | Payer: Medicaid Other | Attending: Physician Assistant | Admitting: Physician Assistant

## 2022-03-22 DIAGNOSIS — J029 Acute pharyngitis, unspecified: Secondary | ICD-10-CM | POA: Insufficient documentation

## 2022-03-22 DIAGNOSIS — Z1152 Encounter for screening for COVID-19: Secondary | ICD-10-CM | POA: Diagnosis not present

## 2022-03-22 DIAGNOSIS — R051 Acute cough: Secondary | ICD-10-CM | POA: Insufficient documentation

## 2022-03-22 DIAGNOSIS — J069 Acute upper respiratory infection, unspecified: Secondary | ICD-10-CM | POA: Insufficient documentation

## 2022-03-22 LAB — GROUP A STREP BY PCR: Group A Strep by PCR: NOT DETECTED

## 2022-03-22 LAB — RESP PANEL BY RT-PCR (RSV, FLU A&B, COVID)  RVPGX2
Influenza A by PCR: NEGATIVE
Influenza B by PCR: NEGATIVE
Resp Syncytial Virus by PCR: NEGATIVE
SARS Coronavirus 2 by RT PCR: NEGATIVE

## 2022-03-22 MED ORDER — LIDOCAINE VISCOUS HCL 2 % MT SOLN: 15.0000 mL | OROMUCOSAL | 0 refills | Status: DC | PRN

## 2022-03-22 MED ORDER — PROMETHAZINE-DM 6.25-15 MG/5ML PO SYRP: 5.0000 mL | ORAL_SOLUTION | Freq: Four times a day (QID) | ORAL | 0 refills | Status: DC | PRN

## 2022-03-22 NOTE — ED Provider Notes (Signed)
[facility]    CSN: [id number] Arrival date & time: 03/22/22  1315      History   Chief Complaint Chief Complaint  Patient presents with   Sore Throat   Generalized Body Aches    HPI Katie Stanley is a 36 y.o. female presenting for 3 day history of sore throat, cough, body aches, sweats, and chills. Denies fever, chest pain, SOB, n/v/d.  Patient says her son was sick with similar symptoms a couple weeks ago and had a fever but was negative for COVID.  She has taken OTC medication for symptoms.  Reports feeling weak and very fatigued.  No other complaints.  HPI  Past Medical History:  Diagnosis Date   Anxiety    Biliary calculi 12/07/2009   Cholelithiasis    Depression    GERD (gastroesophageal reflux disease)    Hepatitis C    body "self healed" per patient   Palpitations    Retained intrauterine contraceptive device (IUD) 01/20/2020   Substance abuse ([place])     Patient Active Problem List   Diagnosis Date Noted   Port-A-Cath in place 05/16/2021   Primary hypertension 05/16/2021   Alcohol abuse 12/31/2020   Paroxysmal tachycardia ([address]) 07/22/2019   Leg edema 07/22/2019   Morbid obesity ([place]) 04/29/2018   Abdominal pain, epigastric    Acute gastritis without hemorrhage    Reflux esophagitis    Gastroesophageal reflux disease 12/25/2017   Chronic bilateral thoracic back pain 06/15/2016   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[place]) 05/01/2016   Anxiety 02/25/2015   Depression, recurrent ([place]) 02/25/2015   Chronic hepatitis C virus infection ([place]) 07/07/2009    Past Surgical History:  Procedure Laterality Date   CHOLECYSTECTOMY     2011   ESOPHAGOGASTRODUODENOSCOPY N/A 09/29/2021   Procedure: ESOPHAGOGASTRODUODENOSCOPY (EGD);  Surgeon: Lucilla Lame, MD;  Location: [facility] ENDOSCOPY;  Service: Endoscopy;  Laterality: N/A;   ESOPHAGOGASTRODUODENOSCOPY (EGD) WITH PROPOFOL N/A 01/21/2018   Procedure: ESOPHAGOGASTRODUODENOSCOPY (EGD) WITH Biopsy;  Surgeon:  Lucilla Lame, MD;  Location: [place];  Service: Endoscopy;  Laterality: N/A;   HYSTEROSCOPY WITH D & C N/A 01/20/2020   Procedure: DILATATION AND CURETTAGE /HYSTEROSCOPY;  Surgeon: Will Bonnet, MD;  Location: [facility] ORS;  Service: Gynecology;  Laterality: N/A;   INTRAUTERINE DEVICE (IUD) INSERTION N/A 01/20/2020   Procedure: INTRAUTERINE DEVICE (IUD) INSERTION MIRENA IUD;  Surgeon: Will Bonnet, MD;  Location: [facility] ORS;  Service: Gynecology;  Laterality: N/A;   IUD REMOVAL N/A 01/20/2020   Procedure: INTRAUTERINE DEVICE (IUD) REMOVAL;  Surgeon: Will Bonnet, MD;  Location: [facility] ORS;  Service: Gynecology;  Laterality: N/A;   PORT-A-CATH REMOVAL  02/08/2022   PORTA CATH INSERTION N/A 01/01/2019   Procedure: PORTA CATH INSERTION;  Surgeon: Algernon Huxley, MD;  Location: [place] CV LAB;  Service: Cardiovascular;  Laterality: N/A;    OB History   No obstetric history on file.      Home Medications    Prior to Admission medications   Medication Sig Start Date End Date Taking? Authorizing Provider  citalopram (CELEXA) 20 MG tablet TAKE 1 TABLET(20 MG) BY MOUTH DAILY 02/14/22  Yes Johnson, Megan P, DO  lidocaine (XYLOCAINE) 2 % solution Use as directed 15 mLs in the mouth or throat every 3 (three) hours as needed for mouth pain (swish and spit). 03/22/22  Yes Laurene Footman B, PA-C  lisinopril (ZESTRIL) 20 MG tablet Take 1 tablet (20 mg total) by mouth daily. 02/14/22  Yes Wynetta Emery,  Megan P, DO  metoprolol succinate (TOPROL-XL) 25 MG 24 hr tablet TAKE 1 TABLET(25 MG) BY MOUTH DAILY 02/14/22  Yes Johnson, Megan P, DO  pantoprazole (PROTONIX) 40 MG tablet Take 1 tablet (40 mg total) by mouth daily. 02/14/22  Yes Johnson, Megan P, DO  promethazine-dextromethorphan (PROMETHAZINE-DM) 6.25-15 MG/5ML syrup Take 5 mLs by mouth 4 (four) times daily as needed. 03/22/22  Yes Laurene Footman B, PA-C  QUEtiapine (SEROQUEL) 25 MG tablet Take 0.5-1 tablets (12.5-25 mg total) by mouth at  bedtime. 03/14/22  Yes Johnson, Megan P, DO  albuterol (VENTOLIN HFA) 108 (90 Base) MCG/ACT inhaler Inhale 2 puffs into the lungs every 6 (six) hours as needed for wheezing or shortness of breath. 02/14/22   Johnson, Megan P, DO  clotrimazole-betamethasone (LOTRISONE) cream Apply 1 Application topically daily. 03/14/22   Johnson, Megan P, DO  Naphazoline HCl (CLEAR EYES OP) Place 1 drop into both eyes daily as needed (itching).    [provider]    Family History Family History  Problem Relation Age of Onset   Hypertension Mother    Diabetes Father    Hypertension Father    Liver cancer Maternal Grandmother    Hypertension Maternal Grandfather    Breast cancer Paternal Grandmother    Heart disease Paternal Grandfather     Social History Social History   Tobacco Use   Smoking status: Every Day    Packs/day: 1.00    Years: 12.00    Total pack years: 12.00    Types: Cigarettes   Smokeless tobacco: Never  Vaping Use   Vaping Use: Never used  Substance Use Topics   Alcohol use: Yes    Comment: 6 BEERS SOME DAY, 20 shots per day, had some beers6/21/2023   Drug use: Yes    Types: Marijuana    Comment: 09/28/2021     Allergies   Penicillins   Review of Systems Review of Systems  Constitutional:  Positive for chills, diaphoresis and fatigue. Negative for fever.  HENT:  Positive for congestion, rhinorrhea, sore throat and trouble swallowing. Negative for ear pain, sinus pressure and sinus pain.   Respiratory:  Positive for cough. Negative for shortness of breath.   Cardiovascular:  Negative for chest pain.  Gastrointestinal:  Negative for abdominal pain, nausea and vomiting.  Musculoskeletal:  Positive for myalgias.  Skin:  Negative for rash.  Neurological:  Negative for weakness and headaches.  Hematological:  Negative for adenopathy.     Physical Exam Triage Vital Signs ED Triage Vitals  Enc Vitals Group     BP --      Pulse --      Resp --      Temp --       Temp src --      SpO2 --      Weight 03/22/22 1603 209 lb 3.5 oz (94.9 kg)     Height 03/22/22 1603 5' 3.9" (1.623 m)     Head Circumference --      Peak Flow --      Pain Score 03/22/22 1602 8     Pain Loc --      Pain Edu? --      Excl. in [place]? --    No data found.  Updated Vital Signs BP (!) 134/92 (BP Location: Right Arm)   Pulse (!) 110   Temp 98.9 F (37.2 C) (Oral)   Resp 16   Ht 5' 3.9" (1.623 m)   Wt 209 lb 3.5  oz (94.9 kg)   SpO2 96%   BMI 36.02 kg/m    Physical Exam Vitals and nursing note reviewed.  Constitutional:      General: She is not in acute distress.    Appearance: Normal appearance. She is ill-appearing. She is not toxic-appearing.  HENT:     Head: Normocephalic and atraumatic.     Nose: Congestion present.     Mouth/Throat:     Mouth: Mucous membranes are moist.     Pharynx: Oropharynx is clear. Posterior oropharyngeal erythema present.  Eyes:     General: No scleral icterus.       Right eye: No discharge.        Left eye: No discharge.     Conjunctiva/sclera: Conjunctivae normal.  Cardiovascular:     Rate and Rhythm: Regular rhythm. Tachycardia present.     Heart sounds: Normal heart sounds.  Pulmonary:     Effort: Pulmonary effort is normal. No respiratory distress.     Breath sounds: Normal breath sounds.  Musculoskeletal:     Cervical back: Neck supple.  Skin:    General: Skin is dry.  Neurological:     General: No focal deficit present.     Mental Status: She is alert. Mental status is at baseline.     Motor: No weakness.     Gait: Gait normal.  Psychiatric:        Mood and Affect: Mood normal.        Behavior: Behavior normal.        Thought Content: Thought content normal.      UC Treatments / Results  Labs (all labs ordered are listed, but only abnormal results are displayed) Labs Reviewed  GROUP A STREP BY PCR  RESP PANEL BY RT-PCR (RSV, FLU A&B, COVID)  [id number]    EKG   Radiology No results  found.  Procedures Procedures (including critical care time)  Medications Ordered in UC Medications - No data to display  Initial Impression / Assessment and Plan / UC Course  I have reviewed the triage vital signs and the nursing notes.  Pertinent labs & imaging results that were available during my care of the patient were reviewed by me and considered in my medical decision making (see chart for details).   36 year old female presenting for 3-day history of sore throat, cough, congestion, body aches, sweats, chills.  She is afebrile.  Patient is ill-appearing but nontoxic.  On exam she has nasal congestion and erythema posterior pharynx with light yellowish postnasal drainage.  Chest is clear to auscultation but she does have tachycardia.  PCR strep test and respiratory panel obtained.  Advised patient I will contact her with results of positive.  We discussed antibiotics if she has strep, antiviral medication as she is COVID-positive and explained that she is out of the window for treatment with Tamiflu if she is positive for the flu.  Advised if she does not hear remain, testing is negative and she has another viral illness.  I did send Promethazine DM viscous lidocaine to pharmacy and advised her supportive care.  Reviewed return and ER precautions.  She declines a work note.  Negative respiratory panel and negative strep testing.  Final Clinical Impressions(s) / UC Diagnoses   Final diagnoses:  Upper respiratory tract infection, unspecified type  Sore throat  Acute cough     Discharge Instructions      -We will call with results of your testing if anything is positive.  I will  send antibiotics if you have strep.  If you have the flu you are out of the window for treatment with Tamiflu.  If you have COVID I can send antiviral medication and you will need to isolate 5 days from symptom onset and then wear mask x 5 days. - If you do not hear from you, those test are negative and  you have another viral illness.  Supportive care encouraged.  I did send cough medication and lidocaine for your throat.  You can also take ibuprofen, Tylenol and use throat lozenges and rest and increase your fluid intake. - Return for any acute worsening of symptoms.     ED Prescriptions     Medication Sig Dispense Auth. Provider   promethazine-dextromethorphan (PROMETHAZINE-DM) 6.25-15 MG/5ML syrup Take 5 mLs by mouth 4 (four) times daily as needed. 118 mL Laurene Footman B, PA-C   lidocaine (XYLOCAINE) 2 % solution Use as directed 15 mLs in the mouth or throat every 3 (three) hours as needed for mouth pain (swish and spit). 100 mL Danton Clap, PA-C      PDMP not reviewed this encounter.   Danton Clap, PA-C 03/22/22 1712

## 2022-03-22 NOTE — ED Triage Notes (Signed)
Pt c/o sore throat, cough, body aches, sweats, chills. Started about 3 days ago denies fever. She states she can see spot on her throat and hard to swallow.

## 2022-03-22 NOTE — Discharge Instructions (Signed)
-  We will call with results of your testing if anything is positive.  I will send antibiotics if you have strep.  If you have the flu you are out of the window for treatment with Tamiflu.  If you have COVID I can send antiviral medication and you will need to isolate 5 days from symptom onset and then wear mask x 5 days. - If you do not hear from you, those test are negative and you have another viral illness.  Supportive care encouraged.  I did send cough medication and lidocaine for your throat.  You can also take ibuprofen, Tylenol and use throat lozenges and rest and increase your fluid intake. - Return for any acute worsening of symptoms.

## 2022-03-28 ENCOUNTER — Ambulatory Visit: Payer: Medicaid Other | Admitting: Family Medicine

## 2022-03-28 ENCOUNTER — Other Ambulatory Visit: Payer: Self-pay | Admitting: Family Medicine

## 2022-03-28 ENCOUNTER — Encounter: Payer: Self-pay | Admitting: Family Medicine

## 2022-03-28 VITALS — BP 101/65 | HR 83 | Temp 98.5°F | Ht 63.9 in | Wt 223.0 lb

## 2022-03-28 DIAGNOSIS — J0100 Acute maxillary sinusitis, unspecified: Secondary | ICD-10-CM | POA: Diagnosis not present

## 2022-03-28 MED ORDER — DOXYCYCLINE HYCLATE 100 MG PO TABS: 100.0000 mg | ORAL_TABLET | Freq: Two times a day (BID) | ORAL | 0 refills | Status: DC

## 2022-03-28 NOTE — Progress Notes (Signed)
BP 101/65   Pulse 83   Temp 98.5 F (36.9 C) (Oral)   Ht 5' 3.9" (1.623 m)   Wt 223 lb (101.2 kg)   SpO2 95%   BMI 38.40 kg/m    Subjective:    Patient ID: Katie Stanley, female    DOB: 1985-08-02, 36 y.o.   MRN: [id number]  HPI: Katie Stanley is a 36 y.o. female  Chief Complaint  Patient presents with   Sore Throat    Patient says she start feeling symptomatic last Monday and says she went to [facility] on Wednesday of last week. Patient says she was tested for COVID, Flu and Strep and testing were all negative. Patient says she feels as if mucus is stuck in her nose and that she has phlegm stuck in her throat and hard for her to talk. Patient was prescribed cough syrup and says it does not help.    UPPER RESPIRATORY TRACT INFECTION Duration: 10 days Worst symptom: sore throat Fever: no Cough: yes Shortness of breath: yes Wheezing: yes Chest pain: no Chest tightness: no Chest congestion: no Nasal congestion: yes Runny nose: no Post nasal drip: no Sneezing: no Sore throat: yes Swollen glands: no Sinus pressure: yes Headache: yes Face pain: yes Toothache: no Ear pain: no  Ear pressure: no  Eyes red/itching:no Eye drainage/crusting: no  Vomiting: no Rash: no Fatigue: yes Sick contacts: yes Strep contacts: no  Context: worse Recurrent sinusitis: no Relief with OTC cold/cough medications: no  Treatments attempted: lidocaine   Relevant past medical, surgical, family and social history reviewed and updated as indicated. Interim medical history since our last visit reviewed. Allergies and medications reviewed and updated.  Review of Systems  Constitutional:  Positive for chills, diaphoresis and fatigue. Negative for activity change, appetite change, fever and unexpected weight change.  HENT:  Positive for congestion, sinus pressure, sinus pain and sore throat. Negative for dental problem, drooling, ear discharge, ear pain, facial swelling, hearing loss, mouth  sores, nosebleeds, postnasal drip, rhinorrhea, sneezing, tinnitus, trouble swallowing and voice change.   Eyes: Negative.   Respiratory:  Positive for cough. Negative for apnea, choking, chest tightness, shortness of breath, wheezing and stridor.   Cardiovascular: Negative.   Gastrointestinal: Negative.   Musculoskeletal: Negative.   Neurological: Negative.   Psychiatric/Behavioral: Negative.      Per HPI unless specifically indicated above     Objective:    BP 101/65   Pulse 83   Temp 98.5 F (36.9 C) (Oral)   Ht 5' 3.9" (1.623 m)   Wt 223 lb (101.2 kg)   SpO2 95%   BMI 38.40 kg/m   Wt Readings from Last 3 Encounters:  03/28/22 223 lb (101.2 kg)  03/22/22 209 lb 3.5 oz (94.9 kg)  03/14/22 209 lb 3.2 oz (94.9 kg)    Physical Exam Vitals and nursing note reviewed.  Constitutional:      General: She is not in acute distress.    Appearance: Normal appearance. She is well-developed. She is obese. She is not ill-appearing, toxic-appearing or diaphoretic.  HENT:     Head: Normocephalic and atraumatic.     Right Ear: Tympanic membrane and external ear normal.     Left Ear: Tympanic membrane and external ear normal.     Nose: Congestion and rhinorrhea present.     Mouth/Throat:     Mouth: Mucous membranes are moist. No oral lesions.     Pharynx: Oropharyngeal exudate and posterior oropharyngeal erythema present. No pharyngeal  swelling or uvula swelling.     Tonsils: 1+ on the right. 1+ on the left.  Eyes:     General: No scleral icterus.       Right eye: No discharge.        Left eye: No discharge.     Extraocular Movements: Extraocular movements intact.     Right eye: Normal extraocular motion.     Left eye: Normal extraocular motion.     Conjunctiva/sclera: Conjunctivae normal.     Pupils: Pupils are equal, round, and reactive to light.  Cardiovascular:     Rate and Rhythm: Normal rate and regular rhythm.     Pulses: Normal pulses.     Heart sounds: Normal heart  sounds. No murmur heard.    No friction rub. No gallop.  Pulmonary:     Effort: Pulmonary effort is normal. No respiratory distress.     Breath sounds: Normal breath sounds. No stridor. No wheezing, rhonchi or rales.  Chest:     Chest wall: No tenderness.  Musculoskeletal:        General: Normal range of motion.     Cervical back: Normal range of motion and neck supple.  Skin:    General: Skin is warm and dry.     Capillary Refill: Capillary refill takes less than 2 seconds.     Coloration: Skin is not jaundiced or pale.     Findings: No bruising, erythema, lesion or rash.  Neurological:     General: No focal deficit present.     Mental Status: She is alert and oriented to person, place, and time. Mental status is at baseline.  Psychiatric:        Mood and Affect: Mood normal.        Behavior: Behavior normal.        Thought Content: Thought content normal.        Judgment: Judgment normal.     Results for orders placed or performed during the hospital encounter of 03/22/22  Group A Strep by PCR   Specimen: Anterior Nasal Swab; Sterile Swab  Result Value Ref Range   Group A Strep by PCR NOT DETECTED NOT DETECTED  Resp panel by RT-PCR (RSV, Flu A&B, Covid) Anterior Nasal Swab   Specimen: Anterior Nasal Swab  Result Value Ref Range   SARS Coronavirus 2 by RT PCR NEGATIVE NEGATIVE   Influenza A by PCR NEGATIVE NEGATIVE   Influenza B by PCR NEGATIVE NEGATIVE   Resp Syncytial Virus by PCR NEGATIVE NEGATIVE      Assessment & Plan:   Problem List Items Addressed This Visit   None Visit Diagnoses     Acute non-recurrent maxillary sinusitis    -  Primary   Will treat with doxycycline. Continue lidocaine. Call if not getting better or getting worse.   Relevant Medications   doxycycline (VIBRA-TABS) 100 MG tablet        Follow up plan: Return if symptoms worsen or fail to improve.

## 2022-03-29 NOTE — Telephone Encounter (Signed)
Unable to refill per protocol, request is too soon. Last refill was 02/14/22 for 90 and 1 refill. Will refuse.  Requested Prescriptions  Pending Prescriptions Disp Refills   citalopram (CELEXA) 20 MG tablet [Pharmacy Med Name: CITALOPRAM '20MG'$  TABLETS] 90 tablet 1    Sig: TAKE 1 TABLET(20 MG) BY MOUTH DAILY     Psychiatry:  Antidepressants - SSRI Passed - 03/28/2022  4:02 PM      Passed - Completed PHQ-2 or PHQ-9 in the last 360 days      Passed - Valid encounter within last 6 months    Recent Outpatient Visits           Yesterday Acute non-recurrent maxillary sinusitis   [facility], [place], DO   2 weeks ago Routine general medical examination at a health care facility   [facility], [place], DO   1 month ago Opioid withdrawal [facility])   [place], [place], DO   7 months ago Primary hypertension   [place], [place], DO   8 months ago Primary hypertension   [place], [place], DO       Future Appointments             In 2 weeks Wynetta Emery, Barb Merino, DO [employer]

## 2022-04-05 DIAGNOSIS — Z1152 Encounter for screening for COVID-19: Secondary | ICD-10-CM | POA: Diagnosis not present

## 2022-04-05 DIAGNOSIS — K219 Gastro-esophageal reflux disease without esophagitis: Secondary | ICD-10-CM | POA: Diagnosis not present

## 2022-04-05 DIAGNOSIS — E873 Alkalosis: Secondary | ICD-10-CM | POA: Diagnosis not present

## 2022-04-05 DIAGNOSIS — Z880 Allergy status to penicillin: Secondary | ICD-10-CM | POA: Diagnosis not present

## 2022-04-05 DIAGNOSIS — F32A Depression, unspecified: Secondary | ICD-10-CM | POA: Diagnosis not present

## 2022-04-05 DIAGNOSIS — J984 Other disorders of lung: Secondary | ICD-10-CM | POA: Diagnosis not present

## 2022-04-05 DIAGNOSIS — G4733 Obstructive sleep apnea (adult) (pediatric): Secondary | ICD-10-CM | POA: Diagnosis not present

## 2022-04-05 DIAGNOSIS — J9601 Acute respiratory failure with hypoxia: Secondary | ICD-10-CM | POA: Diagnosis not present

## 2022-04-05 DIAGNOSIS — R197 Diarrhea, unspecified: Secondary | ICD-10-CM | POA: Diagnosis not present

## 2022-04-05 DIAGNOSIS — M6282 Rhabdomyolysis: Secondary | ICD-10-CM | POA: Diagnosis not present

## 2022-04-05 DIAGNOSIS — I959 Hypotension, unspecified: Secondary | ICD-10-CM | POA: Diagnosis not present

## 2022-04-05 DIAGNOSIS — I469 Cardiac arrest, cause unspecified: Secondary | ICD-10-CM | POA: Diagnosis not present

## 2022-04-05 DIAGNOSIS — R29890 Loss of height: Secondary | ICD-10-CM | POA: Diagnosis not present

## 2022-04-05 DIAGNOSIS — I10 Essential (primary) hypertension: Secondary | ICD-10-CM | POA: Diagnosis not present

## 2022-04-05 DIAGNOSIS — D649 Anemia, unspecified: Secondary | ICD-10-CM | POA: Diagnosis not present

## 2022-04-05 DIAGNOSIS — J189 Pneumonia, unspecified organism: Secondary | ICD-10-CM | POA: Diagnosis not present

## 2022-04-05 DIAGNOSIS — R079 Chest pain, unspecified: Secondary | ICD-10-CM | POA: Diagnosis not present

## 2022-04-05 DIAGNOSIS — M2578 Osteophyte, vertebrae: Secondary | ICD-10-CM | POA: Diagnosis not present

## 2022-04-05 DIAGNOSIS — J9602 Acute respiratory failure with hypercapnia: Secondary | ICD-10-CM | POA: Diagnosis not present

## 2022-04-05 DIAGNOSIS — R918 Other nonspecific abnormal finding of lung field: Secondary | ICD-10-CM | POA: Diagnosis not present

## 2022-04-05 DIAGNOSIS — M5136 Other intervertebral disc degeneration, lumbar region: Secondary | ICD-10-CM | POA: Diagnosis not present

## 2022-04-05 DIAGNOSIS — E871 Hypo-osmolality and hyponatremia: Secondary | ICD-10-CM | POA: Diagnosis not present

## 2022-04-05 DIAGNOSIS — B1920 Unspecified viral hepatitis C without hepatic coma: Secondary | ICD-10-CM | POA: Diagnosis not present

## 2022-04-05 DIAGNOSIS — Z87891 Personal history of nicotine dependence: Secondary | ICD-10-CM | POA: Diagnosis not present

## 2022-04-05 DIAGNOSIS — E872 Acidosis, unspecified: Secondary | ICD-10-CM | POA: Diagnosis not present

## 2022-04-05 DIAGNOSIS — N179 Acute kidney failure, unspecified: Secondary | ICD-10-CM | POA: Diagnosis not present

## 2022-04-05 DIAGNOSIS — E876 Hypokalemia: Secondary | ICD-10-CM | POA: Diagnosis not present

## 2022-04-06 DIAGNOSIS — R4182 Altered mental status, unspecified: Secondary | ICD-10-CM | POA: Diagnosis not present

## 2022-04-06 DIAGNOSIS — J9811 Atelectasis: Secondary | ICD-10-CM | POA: Diagnosis not present

## 2022-04-06 DIAGNOSIS — R569 Unspecified convulsions: Secondary | ICD-10-CM | POA: Diagnosis not present

## 2022-04-06 DIAGNOSIS — I469 Cardiac arrest, cause unspecified: Secondary | ICD-10-CM | POA: Diagnosis not present

## 2022-04-06 DIAGNOSIS — Z4682 Encounter for fitting and adjustment of non-vascular catheter: Secondary | ICD-10-CM | POA: Diagnosis not present

## 2022-04-06 DIAGNOSIS — Z452 Encounter for adjustment and management of vascular access device: Secondary | ICD-10-CM | POA: Diagnosis not present

## 2022-04-06 DIAGNOSIS — R0902 Hypoxemia: Secondary | ICD-10-CM | POA: Diagnosis not present

## 2022-04-06 DIAGNOSIS — J984 Other disorders of lung: Secondary | ICD-10-CM | POA: Diagnosis not present

## 2022-04-06 DIAGNOSIS — R9401 Abnormal electroencephalogram [EEG]: Secondary | ICD-10-CM | POA: Diagnosis not present

## 2022-04-07 DIAGNOSIS — Z452 Encounter for adjustment and management of vascular access device: Secondary | ICD-10-CM | POA: Diagnosis not present

## 2022-04-07 DIAGNOSIS — R569 Unspecified convulsions: Secondary | ICD-10-CM | POA: Diagnosis not present

## 2022-04-07 DIAGNOSIS — R9401 Abnormal electroencephalogram [EEG]: Secondary | ICD-10-CM | POA: Diagnosis not present

## 2022-04-08 DIAGNOSIS — E871 Hypo-osmolality and hyponatremia: Secondary | ICD-10-CM | POA: Diagnosis not present

## 2022-04-08 DIAGNOSIS — R569 Unspecified convulsions: Secondary | ICD-10-CM | POA: Diagnosis not present

## 2022-04-08 DIAGNOSIS — R9401 Abnormal electroencephalogram [EEG]: Secondary | ICD-10-CM | POA: Diagnosis not present

## 2022-04-09 DIAGNOSIS — R569 Unspecified convulsions: Secondary | ICD-10-CM | POA: Diagnosis not present

## 2022-04-09 DIAGNOSIS — R9401 Abnormal electroencephalogram [EEG]: Secondary | ICD-10-CM | POA: Diagnosis not present

## 2022-04-10 DIAGNOSIS — J9602 Acute respiratory failure with hypercapnia: Secondary | ICD-10-CM | POA: Diagnosis not present

## 2022-04-10 DIAGNOSIS — R197 Diarrhea, unspecified: Secondary | ICD-10-CM | POA: Diagnosis not present

## 2022-04-10 DIAGNOSIS — Z8674 Personal history of sudden cardiac arrest: Secondary | ICD-10-CM | POA: Diagnosis not present

## 2022-04-10 DIAGNOSIS — J9601 Acute respiratory failure with hypoxia: Secondary | ICD-10-CM | POA: Diagnosis not present

## 2022-04-10 DIAGNOSIS — F1090 Alcohol use, unspecified, uncomplicated: Secondary | ICD-10-CM | POA: Diagnosis not present

## 2022-04-11 DIAGNOSIS — J9602 Acute respiratory failure with hypercapnia: Secondary | ICD-10-CM | POA: Diagnosis not present

## 2022-04-11 DIAGNOSIS — F1090 Alcohol use, unspecified, uncomplicated: Secondary | ICD-10-CM | POA: Diagnosis not present

## 2022-04-11 DIAGNOSIS — R197 Diarrhea, unspecified: Secondary | ICD-10-CM | POA: Diagnosis not present

## 2022-04-11 DIAGNOSIS — Z8674 Personal history of sudden cardiac arrest: Secondary | ICD-10-CM | POA: Diagnosis not present

## 2022-04-11 DIAGNOSIS — R7989 Other specified abnormal findings of blood chemistry: Secondary | ICD-10-CM | POA: Diagnosis not present

## 2022-04-11 DIAGNOSIS — S36119A Unspecified injury of liver, initial encounter: Secondary | ICD-10-CM | POA: Diagnosis not present

## 2022-04-11 DIAGNOSIS — E871 Hypo-osmolality and hyponatremia: Secondary | ICD-10-CM | POA: Diagnosis not present

## 2022-04-11 DIAGNOSIS — R160 Hepatomegaly, not elsewhere classified: Secondary | ICD-10-CM | POA: Diagnosis not present

## 2022-04-11 DIAGNOSIS — E559 Vitamin D deficiency, unspecified: Secondary | ICD-10-CM | POA: Diagnosis not present

## 2022-04-11 DIAGNOSIS — J9601 Acute respiratory failure with hypoxia: Secondary | ICD-10-CM | POA: Diagnosis not present

## 2022-04-12 DIAGNOSIS — E876 Hypokalemia: Secondary | ICD-10-CM | POA: Diagnosis not present

## 2022-04-12 DIAGNOSIS — J9601 Acute respiratory failure with hypoxia: Secondary | ICD-10-CM | POA: Diagnosis not present

## 2022-04-12 DIAGNOSIS — J9602 Acute respiratory failure with hypercapnia: Secondary | ICD-10-CM | POA: Diagnosis not present

## 2022-04-12 DIAGNOSIS — K710 Toxic liver disease with cholestasis: Secondary | ICD-10-CM | POA: Diagnosis not present

## 2022-04-12 DIAGNOSIS — F19288 Other psychoactive substance dependence with other psychoactive substance-induced disorder: Secondary | ICD-10-CM | POA: Diagnosis not present

## 2022-04-13 ENCOUNTER — Ambulatory Visit: Payer: Self-pay

## 2022-04-13 DIAGNOSIS — J9602 Acute respiratory failure with hypercapnia: Secondary | ICD-10-CM | POA: Diagnosis not present

## 2022-04-13 DIAGNOSIS — J9601 Acute respiratory failure with hypoxia: Secondary | ICD-10-CM | POA: Diagnosis not present

## 2022-04-13 DIAGNOSIS — E876 Hypokalemia: Secondary | ICD-10-CM | POA: Diagnosis not present

## 2022-04-13 DIAGNOSIS — E559 Vitamin D deficiency, unspecified: Secondary | ICD-10-CM | POA: Diagnosis not present

## 2022-04-13 NOTE — Telephone Encounter (Signed)
Katie Mort NP called form [facility] where pt is about to be discharged.  Katie Stanley wanted Katie Stanley to know: Pt was to be admitted for ETOH withdrawal, but she had a PEA arrest in ED waiting room. She stated d/t electrolyte abnormalities.  [place] advised pt  also has cholestatic liver injury and they advised to follow LFT's (which are improving). Low Vitamin D of 8.7 and started high dose (50,000 IU) advised her Vitamin D level to be rechecked in beginnings of March.  Possible OSA and advised sleep study. She is on room air and doing well.  SW was consulted and was going to discuss smoking and alcohol cessation. Pt refused.  For any questions, call Alverda Skeans NP at [phone].   Pt has appt tomorrow

## 2022-04-14 ENCOUNTER — Ambulatory Visit: Payer: Medicaid Other | Admitting: Family Medicine

## 2022-04-14 ENCOUNTER — Telehealth: Payer: Self-pay | Admitting: Family Medicine

## 2022-04-14 NOTE — Telephone Encounter (Signed)
I cannot do this. She would need an appointment or to contact the prescribing physician.

## 2022-04-14 NOTE — Telephone Encounter (Signed)
Pt aware.

## 2022-04-14 NOTE — Telephone Encounter (Signed)
Pt's friendTony called in on pt's behalf regarding oxyCODONE (ROXICODONE) 5 MG immediate release tablet. Pt was prescribe this medication at the hospital. Rx was sent to a pharmacy that doesn't have med in stock. Pt would like to know if provider could send it to a different pharmacy?    Pharmacy: [facility] [place], [place] AT [address] [[id number]]   Please assist further.

## 2022-04-28 ENCOUNTER — Encounter: Payer: Self-pay | Admitting: Family Medicine

## 2022-04-28 ENCOUNTER — Ambulatory Visit: Payer: Medicaid Other | Admitting: Family Medicine

## 2022-04-28 VITALS — BP 158/113 | HR 123 | Temp 99.2°F | Ht 63.0 in | Wt 202.4 lb

## 2022-04-28 DIAGNOSIS — J9601 Acute respiratory failure with hypoxia: Secondary | ICD-10-CM

## 2022-04-28 DIAGNOSIS — K760 Fatty (change of) liver, not elsewhere classified: Secondary | ICD-10-CM

## 2022-04-28 DIAGNOSIS — I10 Essential (primary) hypertension: Secondary | ICD-10-CM

## 2022-04-28 DIAGNOSIS — Z8674 Personal history of sudden cardiac arrest: Secondary | ICD-10-CM | POA: Insufficient documentation

## 2022-04-28 DIAGNOSIS — N179 Acute kidney failure, unspecified: Secondary | ICD-10-CM

## 2022-04-28 DIAGNOSIS — R0681 Apnea, not elsewhere classified: Secondary | ICD-10-CM

## 2022-04-28 DIAGNOSIS — F1021 Alcohol dependence, in remission: Secondary | ICD-10-CM | POA: Diagnosis not present

## 2022-04-28 DIAGNOSIS — E871 Hypo-osmolality and hyponatremia: Secondary | ICD-10-CM

## 2022-04-28 DIAGNOSIS — D649 Anemia, unspecified: Secondary | ICD-10-CM

## 2022-04-28 DIAGNOSIS — E876 Hypokalemia: Secondary | ICD-10-CM

## 2022-04-28 DIAGNOSIS — K831 Obstruction of bile duct: Secondary | ICD-10-CM

## 2022-04-28 DIAGNOSIS — R079 Chest pain, unspecified: Secondary | ICD-10-CM

## 2022-04-28 DIAGNOSIS — M6282 Rhabdomyolysis: Secondary | ICD-10-CM

## 2022-04-28 DIAGNOSIS — R441 Visual hallucinations: Secondary | ICD-10-CM

## 2022-04-28 DIAGNOSIS — E559 Vitamin D deficiency, unspecified: Secondary | ICD-10-CM

## 2022-04-28 DIAGNOSIS — R9431 Abnormal electrocardiogram [ECG] [EKG]: Secondary | ICD-10-CM

## 2022-04-28 MED ORDER — LISINOPRIL 10 MG PO TABS: 10.0000 mg | ORAL_TABLET | Freq: Every day | ORAL | 3 refills | Status: DC

## 2022-04-28 MED ORDER — NALTREXONE HCL 50 MG PO TABS: 50.0000 mg | ORAL_TABLET | Freq: Every day | ORAL | 2 refills | Status: DC

## 2022-04-28 MED ORDER — LIDOCAINE 5 % EX PTCH: 1.0000 | MEDICATED_PATCH | CUTANEOUS | 0 refills | Status: DC

## 2022-04-28 NOTE — Assessment & Plan Note (Signed)
Currently on supplementation. Will recheck in 1 month.

## 2022-04-28 NOTE — Assessment & Plan Note (Signed)
Rechecking labs today. Await results.  

## 2022-04-28 NOTE — Assessment & Plan Note (Signed)
Not under good control. Will restart '10mg'$  lisinopril and recheck 2 weeks. Call with any concerns.

## 2022-04-28 NOTE — Assessment & Plan Note (Signed)
PEA due to metabolic derangements. Rechecking labs today. Continue to monitor closely.

## 2022-04-28 NOTE — Assessment & Plan Note (Signed)
Has not drank since getting out of the hospital. Feels like cravings are OK. No withdrawals. Will get her started on naltrexone. Recheck 2 weeks.

## 2022-04-28 NOTE — Progress Notes (Signed)
BP (!) 158/113 (BP Location: Left Arm, Cuff Size: Normal)   Pulse (!) 123   Temp 99.2 F (37.3 C) (Oral)   Ht '5\' 3"'$  (1.6 m)   Wt 202 lb 6.4 oz (91.8 kg)   SpO2 97%   BMI 35.85 kg/m    Subjective:    Patient ID: Katie Stanley, female    DOB: 03-16-1986, 37 y.o.   MRN: [id number]  HPI: Katie Stanley is a 37 y.o. female  Chief Complaint  Patient presents with   Hospitalization Follow-up   Transition of [facility] Follow up.   Hospital/Facility: [facility] D/C Physician: Clayborn Bigness, MD  D/C Date: 04/13/22   Records Requested: 04/28/22 Records Received: 04/28/22 Records Reviewed: 04/28/22  Diagnoses on Discharge: Acute hypoxemic respiratory failure, Alcohol Withdrawal, Hypocalcemia, hypokalemia, hyponatremia, cholestasis, cardiac arrest, QT prolongation, carpopedal spasm, AKI, visual hallucination, bronchitis, non-traumatic rhabdomyolsis  Date of interactive Contact within 48 hours of discharge: 04/14/22 Contact was through: phone  Date of 7 day or 14 day face-to-face visit:  04/28/22 NOT within 14 days  Outpatient Encounter Medications as of 04/28/2022  Medication Sig   albuterol (VENTOLIN HFA) 108 (90 Base) MCG/ACT inhaler Inhale 2 puffs into the lungs every 6 (six) hours as needed for wheezing or shortness of breath.   citalopram (CELEXA) 20 MG tablet TAKE 1 TABLET(20 MG) BY MOUTH DAILY   clotrimazole-betamethasone (LOTRISONE) cream Apply 1 Application topically daily.   lidocaine (LIDODERM) 5 % Place 1 patch onto the skin daily. Remove & Discard patch within 12 hours or as directed by MD   lidocaine (XYLOCAINE) 2 % solution Use as directed 15 mLs in the mouth or throat every 3 (three) hours as needed for mouth pain (swish and spit).   magnesium oxide (MAG-OX) 400 (240 Mg) MG tablet Take 1 tablet by mouth 2 (two) times daily.   naltrexone (DEPADE) 50 MG tablet Take 1 tablet (50 mg total) by mouth daily. Do not take any opiods with this medicine or you will go into  withdrawal   Naphazoline HCl (CLEAR EYES OP) Place 1 drop into both eyes daily as needed (itching).   pantoprazole (PROTONIX) 40 MG tablet Take 1 tablet (40 mg total) by mouth daily.   Vitamin D, Ergocalciferol, (DRISDOL) 1.25 MG (50000 UNIT) CAPS capsule Take 50,000 Units by mouth once a week.   [DISCONTINUED] QUEtiapine (SEROQUEL) 25 MG tablet Take 0.5-1 tablets (12.5-25 mg total) by mouth at bedtime.   lisinopril (ZESTRIL) 10 MG tablet Take 1 tablet (10 mg total) by mouth daily.   [DISCONTINUED] doxycycline (VIBRA-TABS) 100 MG tablet Take 1 tablet (100 mg total) by mouth 2 (two) times daily. (Patient not taking: Reported on 04/28/2022)   [DISCONTINUED] lisinopril (ZESTRIL) 20 MG tablet Take 1 tablet (20 mg total) by mouth daily. (Patient not taking: Reported on 04/28/2022)   [DISCONTINUED] metoprolol succinate (TOPROL-XL) 25 MG 24 hr tablet TAKE 1 TABLET(25 MG) BY MOUTH DAILY (Patient not taking: Reported on 04/28/2022)   [DISCONTINUED] promethazine-dextromethorphan (PROMETHAZINE-DM) 6.25-15 MG/5ML syrup Take 5 mLs by mouth 4 (four) times daily as needed. (Patient not taking: Reported on 04/28/2022)   No facility-administered encounter medications on file as of 04/28/2022.  Per Hospitalist: "Hospital Course:  Katie Stanley is a 37 y.o. female whose presentation is complicated by substance abuse, Hep C, GERD and depression hat presented to [facility] with concern for alcohol withdrawal and suffered PEA arrest in the ED likely 2/2 electrolyte derangement. ROSC achieved after two rounds of CPR however  patient required emergent intubation for AHRF. She is now improving and off supplemental oxygen and stable for dc home with plan for close PCP follow up. Below are more details of her stay at [facility] according to problem:  Acute Hypoxic and Hypercarbic Respiratory Failure I Concern for CAP  likely OSA Patient suffered AHRF in the setting of PEA arrest with decreased respiratory drive and cyanosis. Initially  intubated emergently however was able to be extubated to BiPAP/[place] on 12/31. Patient also received course of antibiotics for CAP coverage in the setting of leukocytosis and fever during this admission. Weaned to room air and noted to have nightly desats and is a risk of OSA and would have sleep study with PCP referral.   Cholestatic Liver Injury  History of Hep C without Treatment Noted to have hep C since 2009 without treatment, however, subsequently had undetectable levels on hep C RNA quant on 02/25/2015 and she reports she was told that she cleared the disease on her own and RNA continues to be negative. Liver US showed likely fatty liver but no clots or obstruction. Liver enzymes continued to improve and stable and feel more likely due to acute illness and/or alcohol use and can follow up in PCP follow up with LFTs to ensure ongoing improvement.  Substance Use Disorder  Reports drinking a fifth of whisky daily without quitting in the past so no history of withdrawals. Was drinking heavily immediately prior to presentation. Also on Suboxone per dispense history but last received in August 2023 due to the clinic closed. EtOH level undetectable on admit. Monitored on CIWA and did not score. SW was consulted and she declined resources at this time. Will continue to encourage abstinence and use thiamine and have PCP follow up. PCP can consider pharmacotherapy for alcohol cravings.   Hypokalemia  Hypomagnesemia  Hypocalcemia  Hyponatremia Severe electrolyte derangements on admission thought to be the leading cause of her PEA arrest. Persistent without other PO intake likely contributory, though abnormalities have persisted with repletion. Refeeding syndrome may be contributory. Hypomagnesemia likely due to renal wasting secondary to alcohol use. Magnesium was stable at discharge, but will continue supplementation and PCP can check levels to ensure ongoing improvement.   Severe Vitamin D  Deficiency Vitamin D very low at 8.7 on admission, began high dose repletion (PO ergocalciferol 50,000 units weekly) and recheck in ~3 months with PCP follow up.   Diarrhea  Reported 1-2 weeks of significant diarrhea. C/diff negative during admission. No abdominal pain. Symptoms have now resolved.   Pain Having signciant rib cage/chest pain 2/2 to her chest compressions that is exacerbated with movement, especially while coughing. Pain regimen at discharge consisted of scheduled Tylenol 1g TID and oxycodone 5 mg PRN for 5 days if experiencing severe pain. Give substance abuse would not provide opioids moving forward.   Normocytic Anemia: Unclear etiology but some concern of GIB given blood seen in OG tube. Recent EGD 09/2021 without evidence of gastric varices or PUD. No reports of melena and none observed on rectal exam. Folate/B12 wnl. Hgb stable in 8s.  PEA Arrest  Underwent PEA arrest 12/28 with notable electrolyte derangements of K 1.3, lactate 5.6, calcium 2.79 and Mag 0.6. ROSC achieved after 2 rounds of epi and compressions. Preliminary echo post-PEA arrest without wall abnormalities or decreased EF. No hx of CAD or heart failure. ECG consistent with prolonged QTC in 500s but no evidence of acute ischemia.   Rhabdomyolysis, mild: Presented with CK 3789 in setting of PEA  and significant electrolyte abnormalities.  AKI creatinine up to 1.19, likely pre-renal.AKI and rhabdomyolysis resolved with fluids.  HTN: Holding home Lisinopril and Metoprolol and bp mostly normal during stay. Can consider restart with PCP follow up.  Depression: Continue home Celexa History of IVD Use: HIV negative this admission, hep B core antibody and surface antigen negative with positive hep B surface antibody titer consistent with immunization, hep C RNA was not detected."   Diagnostic Tests Reviewed:  US Liver Doppler  Result Date: 04/12/2022 EXAM: US LIVER DOPPLER ACCESSION: [id number] [facility]  CLINICAL  INDICATION: 37 years old with Elevated LFTs  COMPARISON: None  TECHNIQUE: Ultrasound views of the complete abdomen were obtained using grayscale, color Doppler, and spectral Doppler analysis.  FINDINGS:  LIVER: The liver was enlarged and echogenic. Smooth contour. No focal hepatic lesions. No intrahepatic biliary ductal dilatation. The common bile duct was normal in caliber for postcholecystectomy state. Liver: 22.86 cm Common bile duct: 0.55 cm  GALLBLADDER: The gallbladder is surgically absent. PANCREAS: Visualized portion was unremarkable.  SPLEEN: Normal in size and echotexture. Spleen: 12.05 cm  KIDNEYS: Normal in size and echotexture. No solid masses or calculi. No hydronephrosis. Right kidney: 12.58 cm Left kidney: 13.43 cm  VESSELS - Portal vein: The main, left and right portal veins were patent with hepatopetal flow. Normal main portal vein velocity (0.20 m/s or greater) Main portal vein diameter: 1.23 cm  Main portal vein velocity: 0.56 m/s Anterior right portal vein velocity: 0.40 m/s Posterior right portal vein velocity: 0.33 m/s Left portal vein velocity: 0.47 m/s  Main portal vein flow: hepatopetal Right portal vein flow: hepatopetal Left portal vein flow: hepatopetal  - Splenic vein: Patent, with hepatopetal flow. Splenic vein midline: hepatopetal Splenic vein proximal: hepatopetal  - Hepatic veins/IVC: The IVC, left, middle and right hepatic veins were patent. Left hepatic vein phasicity/flow: triphasic Middle hepatic vein phasicity/flow: triphasic Right hepatic vein phasicity/flow: triphasic Inferior vena cava phasicity/flow: triphasic  - Hepatic artery: Patent with color and spectral Doppler imaging Common hepatic artery: Patent  - Visualized proximal aorta: unremarkable Aorta: partially visualized  OTHER: No ascites.  1. Enlarged echogenic liver which can be seen in the setting of hepatic steatosis and/or chronic liver disease. 2. Patent hepatic vasculature with  normal flow direction.  Disposition: Home  Consults: None  Discharge Instructions: Vit D check in 8 weeks with PCP Mag/lytes check with PCP follow up Sleep study referral by PCP LFT check with PCP follow up ETOH cessation with PCP and consider meds if patient willing SMoking cessation follow up with PCP. BP check and consider restarting meds at PCP follow up Weight loss efforts with PCP follow up   Disease/illness Education: Discussed today  Home Health/Community Services Discussions/Referrals: N/A  Establishment or re-establishment of referral orders for community resources: N/A  Discussion with other health care providers: None  Assessment and Support of treatment regimen adherence: [place] with: Patient   Education for self-management, independent living, and ADLs:  Discussed today  Since getting out of the hospital, Katie Stanley has not been feeling good. She is aching, especially in her arms and her chest. She has been coughing and has been having a lot of chest pains in her ribs. She has been afraid she has pneumonia. No fevers. No chills. She notes that this scared her a lot. She has not drank since she got out of the hospital, but she notes that she would like something to help with the cravings.   SLEEP APNEA Sleep  apnea status: uncontrolled Duration: chronic Satisfied with current treatment?:  no CPAP use:  no Sleep quality with CPAP use:  N/A Last sleep study: never Treatments attempted: none Wakes feeling refreshed:  no Daytime hypersomnolence:  yes Fatigue:  yes Insomnia:  yes Good sleep hygiene:  no Difficulty falling asleep:  yes Difficulty staying asleep:  yes Snoring bothers bed partner:  yes Observed apnea by bed partner: yes Obesity:  yes Hypertension: yes  Pulmonary hypertension:  no Coronary artery disease:  no  HYPERTENSION  Hypertension status: uncontrolled  Satisfied with current treatment? no Duration of hypertension:  chronic BP monitoring frequency:  rarely BP medication side effects:  no Medication compliance: good compliance Previous BP meds:metoprolol, lisinopril Aspirin: no Recurrent headaches: no Visual changes: no Palpitations: no Dyspnea: no Chest pain: no Lower extremity edema: yes Dizzy/lightheaded: no   Relevant past medical, surgical, family and social history reviewed and updated as indicated. Interim medical history since our last visit reviewed. Allergies and medications reviewed and updated.  Review of Systems  Constitutional:  Positive for fatigue. Negative for activity change, appetite change, chills, diaphoresis, fever and unexpected weight change.  HENT:  Positive for congestion, ear pain, postnasal drip and rhinorrhea. Negative for dental problem, drooling, ear discharge, facial swelling, hearing loss, mouth sores, nosebleeds, sinus pressure, sinus pain, sneezing, sore throat, tinnitus, trouble swallowing and voice change.   Eyes: Negative.   Respiratory:  Positive for cough, shortness of breath and wheezing. Negative for apnea, choking, chest tightness and stridor.   Cardiovascular:  Positive for chest pain. Negative for palpitations and leg swelling.  Gastrointestinal: Negative.   Musculoskeletal:  Positive for myalgias. Negative for arthralgias, back pain, gait problem, joint swelling, neck pain and neck stiffness.  Skin: Negative.   Psychiatric/Behavioral: Negative.      Per HPI unless specifically indicated above     Objective:    BP (!) 158/113 (BP Location: Left Arm, Cuff Size: Normal)   Pulse (!) 123   Temp 99.2 F (37.3 C) (Oral)   Ht '5\' 3"'$  (1.6 m)   Wt 202 lb 6.4 oz (91.8 kg)   SpO2 97%   BMI 35.85 kg/m   Wt Readings from Last 3 Encounters:  04/28/22 202 lb 6.4 oz (91.8 kg)  03/28/22 223 lb (101.2 kg)  03/22/22 209 lb 3.5 oz (94.9 kg)    Physical Exam Vitals and nursing note reviewed.  Constitutional:      General: She is not in acute distress.     Appearance: Normal appearance. She is obese. She is ill-appearing. She is not toxic-appearing or diaphoretic.  HENT:     Head: Normocephalic and atraumatic.     Right Ear: External ear normal.     Left Ear: External ear normal.     Nose: Nose normal.     Mouth/Throat:     Mouth: Mucous membranes are moist.     Pharynx: Oropharynx is clear.  Eyes:     General: No scleral icterus.       Right eye: No discharge.        Left eye: No discharge.     Extraocular Movements: Extraocular movements intact.     Conjunctiva/sclera: Conjunctivae normal.     Pupils: Pupils are equal, round, and reactive to light.  Cardiovascular:     Rate and Rhythm: Normal rate and regular rhythm.     Pulses: Normal pulses.     Heart sounds: Normal heart sounds. No murmur heard.    No friction rub. No  gallop.  Pulmonary:     Effort: Pulmonary effort is normal. No respiratory distress.     Breath sounds: No stridor. No wheezing, rhonchi or rales.     Comments: Decreased breath sounds R base Chest:     Chest wall: No tenderness.  Musculoskeletal:        General: Normal range of motion.     Cervical back: Normal range of motion and neck supple.  Skin:    General: Skin is warm and dry.     Capillary Refill: Capillary refill takes less than 2 seconds.     Coloration: Skin is not jaundiced or pale.     Findings: No bruising, erythema, lesion or rash.  Neurological:     General: No focal deficit present.     Mental Status: She is alert and oriented to person, place, and time. Mental status is at baseline.  Psychiatric:        Mood and Affect: Mood normal. Affect is tearful.        Behavior: Behavior normal.        Thought Content: Thought content normal.        Judgment: Judgment normal.     Results for orders placed or performed during the hospital encounter of 03/22/22  Group A Strep by PCR   Specimen: Anterior Nasal Swab; Sterile Swab  Result Value Ref Range   Group A Strep by PCR NOT DETECTED NOT  DETECTED  Resp panel by RT-PCR (RSV, Flu A&B, Covid) Anterior Nasal Swab   Specimen: Anterior Nasal Swab  Result Value Ref Range   SARS Coronavirus 2 by RT PCR NEGATIVE NEGATIVE   Influenza A by PCR NEGATIVE NEGATIVE   Influenza B by PCR NEGATIVE NEGATIVE   Resp Syncytial Virus by PCR NEGATIVE NEGATIVE      Assessment & Plan:   Problem List Items Addressed This Visit       Cardiovascular and Mediastinum   Primary hypertension    Not under good control. Will restart '10mg'$  lisinopril and recheck 2 weeks. Call with any concerns.       Relevant Medications   lisinopril (ZESTRIL) 10 MG tablet   Other Relevant Orders   Comp Met (CMET)     Digestive   Hepatic steatosis    Rechecking labs today. Await results.       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 Feels like cravings are OK. No withdrawals. Will get her started on naltrexone. Recheck 2 weeks.       Vitamin D deficiency    Currently on supplementation. Will recheck in 1 month.       Relevant Orders   Comp Met (CMET)   History of cardiac arrest    PEA due to metabolic derangements. Rechecking labs today. Continue to monitor closely.       Relevant Orders   Comp Met (CMET)   DG Chest 2 View   Other Visit Diagnoses     Hypokalemia       Rechecking labs today. Await results. Treat as needed.   Relevant Orders   Comp Met (CMET)   Hypomagnesemia       Rechecking labs today. Await results. Treat as needed.   Relevant Orders   Comp Met (CMET)   Magnesium   Hypocalcemia       Rechecking labs today.  Await results. Treat as needed.   Relevant Orders   Comp Met (CMET)   Hyponatremia       Rechecking labs today. Await results. Treat as needed.   Relevant Orders   Comp Met (CMET)   AKI (acute kidney injury) ([place])       Rechecking labs today. Await results. Treat as needed.   Relevant Orders    Comp Met (CMET)   Acute hypoxemic respiratory failure (HCC)       Resolved. Lungs clear. Will check CXR due to cough.   Relevant Orders   Comp Met (CMET)   DG Chest 2 View   Non-traumatic rhabdomyolysis       Resolved in hospital. Feeling better- still achey.   Relevant Orders   Comp Met (CMET)   QT prolongation       Off several medicines. Thought to be due to metabolic derangement. Will recheck next visit.   Relevant Orders   Comp Met (CMET)   Cholestasis       Rechecking labs today. Await results. Treat as needed.   Relevant Orders   Comp Met (CMET)   Visual hallucination       Resolved. Thought to be due to metabolic encephalopathy.   Relevant Orders   Comp Met (CMET)   Witnessed episode of apnea       Will get set up for sleep study. Ordered today. Await results.   Relevant Orders   Comp Met (CMET)   Ambulatory referral to Sleep Studies   Anemia, unspecified type       Rechecking labs today. Await results. Treat as needed.   Relevant Orders   CBC with Differential/Platelet   Chest pain, unspecified type       Likely cracked ribs due to compressions- will check CXR and treat with lidoderm patches. Call wiht any concerns.        Follow up plan: Return in about 2 weeks (around 05/12/2022).   >40 minutes spent with patient today.

## 2022-05-02 ENCOUNTER — Ambulatory Visit
Admission: RE | Admit: 2022-05-02 | Discharge: 2022-05-02 | Disposition: A | Discharge status: Home / Self Care | Payer: Medicaid Other | Source: Ambulatory Visit | Attending: Family Medicine | Admitting: Family Medicine

## 2022-05-02 ENCOUNTER — Ambulatory Visit
Admission: RE | Admit: 2022-05-02 | Discharge: 2022-05-02 | Disposition: A | Discharge status: Home / Self Care | Payer: Medicaid Other | Attending: Family Medicine | Admitting: Family Medicine

## 2022-05-02 DIAGNOSIS — J9601 Acute respiratory failure with hypoxia: Secondary | ICD-10-CM | POA: Diagnosis not present

## 2022-05-02 DIAGNOSIS — Z8674 Personal history of sudden cardiac arrest: Secondary | ICD-10-CM | POA: Diagnosis present

## 2022-05-02 DIAGNOSIS — R0602 Shortness of breath: Secondary | ICD-10-CM | POA: Diagnosis not present

## 2022-05-02 DIAGNOSIS — R079 Chest pain, unspecified: Secondary | ICD-10-CM | POA: Diagnosis not present

## 2022-05-03 ENCOUNTER — Ambulatory Visit: Payer: Self-pay

## 2022-05-03 NOTE — Telephone Encounter (Signed)
Summary: Medication Request   Patient called because she wants to request stronger medication. Patient says the lidocaine patches and ibuprofen aren't working for her pain.       Chief Complaint: Had CPR in hospital and has broken ribs. Lidocaine and Motrin are not helping. Asking for something stronger, hurts with coughing as well. Symptoms: Pain Frequency: In hospital Pertinent Negatives: Patient denies  Disposition: '[]'$ ED /'[]'$ Urgent Care (no appt availability in office) / '[]'$ Appointment(In office/virtual)/ '[]'$  [facility]/ '[]'$ Home Care/ '[]'$ Refused Recommended Disposition /'[]'$ [facility]/ '[x]'$  Follow-up with PCP Additional Notes: Please advise pt.  Answer Assessment - Initial Assessment Questions 1. LOCATION: "Where does it hurt?"       Middle 2. RADIATION: "Does the pain go anywhere else?" (e.g., into neck, jaw, arms, back)     Middle 3. ONSET: "When did the chest pain begin?" (Minutes, hours or days)      In hospital 4. PATTERN: "Does the pain come and go, or has it been constant since it started?"  "Does it get worse with exertion?"      Constant 5. DURATION: "How long does it last" (e.g., seconds, minutes, hours)     Hours 6. SEVERITY: "How bad is the pain?"  (e.g., Scale 1-10; mild, moderate, or severe)    - MILD (1-3): doesn't interfere with normal activities     - MODERATE (4-7): interferes with normal activities or awakens from sleep    - SEVERE (8-10): excruciating pain, unable to do any normal activities       Now - 8 7. CARDIAC RISK FACTORS: "Do you have any history of heart problems or risk factors for heart disease?" (e.g., angina, prior heart attack; diabetes, high blood pressure, high cholesterol, smoker, or strong family history of heart disease)     Cardiac arrest 8. PULMONARY RISK FACTORS: "Do you have any history of lung disease?"  (e.g., blood clots in lung, asthma, emphysema, birth control pills)      9. CAUSE: "What do you think is causing the  chest pain?"     Broken ribs from CPR 10. OTHER SYMPTOMS: "Do you have any other symptoms?" (e.g., dizziness, nausea, vomiting, sweating, fever, difficulty breathing, cough)       Cough 11. PREGNANCY: "Is there any chance you are pregnant?" "When was your last menstrual period?"       No  Protocols used: Chest Pain-A-AH

## 2022-05-08 ENCOUNTER — Other Ambulatory Visit: Payer: Self-pay | Admitting: Family Medicine

## 2022-05-08 MED ORDER — TRAMADOL HCL 50 MG PO TABS: 50.0000 mg | ORAL_TABLET | Freq: Three times a day (TID) | ORAL | 0 refills | Status: AC | PRN

## 2022-05-08 NOTE — Telephone Encounter (Signed)
Rx sent to her pharmacy. Will not be able to continue it after this Rx.

## 2022-05-09 NOTE — Telephone Encounter (Signed)
Spoke with patient and made her aware of Dr.Johnson's recommendations. Patient verbalized understanding.

## 2022-05-12 ENCOUNTER — Encounter: Payer: Self-pay | Admitting: Family Medicine

## 2022-05-12 ENCOUNTER — Telehealth (INDEPENDENT_AMBULATORY_CARE_PROVIDER_SITE_OTHER): Payer: Medicaid Other | Admitting: Family Medicine

## 2022-05-12 DIAGNOSIS — I10 Essential (primary) hypertension: Secondary | ICD-10-CM | POA: Diagnosis not present

## 2022-05-12 DIAGNOSIS — J0100 Acute maxillary sinusitis, unspecified: Secondary | ICD-10-CM

## 2022-05-12 DIAGNOSIS — K831 Obstruction of bile duct: Secondary | ICD-10-CM | POA: Diagnosis not present

## 2022-05-12 DIAGNOSIS — E876 Hypokalemia: Secondary | ICD-10-CM | POA: Diagnosis not present

## 2022-05-12 MED ORDER — PREDNISONE 10 MG PO TABS: ORAL_TABLET | ORAL | 0 refills | Status: DC

## 2022-05-12 MED ORDER — DOXYCYCLINE HYCLATE 100 MG PO TABS: 100.0000 mg | ORAL_TABLET | Freq: Two times a day (BID) | ORAL | 0 refills | Status: DC

## 2022-05-12 NOTE — Progress Notes (Signed)
Made pt appointment for a in person office visit on Feb 15 at 1:20pm, I double booked you with a My Chart Video Visit hope that was okay?

## 2022-05-12 NOTE — Progress Notes (Signed)
There were no vitals taken for this visit.   Subjective:    Patient ID: Katie Stanley, female    DOB: May 22, 1985, 37 y.o.   MRN: [id number]  HPI: Katie Stanley is a 37 y.o. female  Chief Complaint  Patient presents with   Ear Fullness    Patient says she is still having issues with her R still feeling full. Patient says she is thinking it may be inner ear or something.    UPPER RESPIRATORY TRACT INFECTION Duration: 3-4 weeks Worst symptom: congestion Fever: no Cough: yes Shortness of breath: no Wheezing: no Chest pain: no Chest tightness: no Chest congestion: no Nasal congestion: yes Runny nose: yes Post nasal drip: yes Sneezing: yes Sore throat: yes Swollen glands: no Sinus pressure: no Headache: no Face pain: no Toothache: no Ear pain: no  Ear pressure: yes "right Eyes red/itching:no Eye drainage/crusting: no  Vomiting: no Rash: no Fatigue: yes Sick contacts: no Strep contacts: no  Context: stable Recurrent sinusitis: no Relief with OTC cold/cough medications: no  Treatments attempted: cold/sinus, mucinex, anti-histamine, and pseudoephedrine   HYPERTENSION  Hypertension status: better  Satisfied with current treatment? yes Duration of hypertension: chronic BP monitoring frequency:  not checking BP range:  BP medication side effects:  no Medication compliance: excellent compliance Previous BP meds:lisinopril Aspirin: no Recurrent headaches: no Visual changes: no Palpitations: no Dyspnea: no Chest pain: no Lower extremity edema: no Dizzy/lightheaded: no  Relevant past medical, surgical, family and social history reviewed and updated as indicated. Interim medical history since our last visit reviewed. Allergies and medications reviewed and updated.  Review of Systems  Constitutional:  Positive for fatigue. Negative for activity change, appetite change, chills, diaphoresis, fever and unexpected weight change.  HENT:  Positive for congestion,  postnasal drip, rhinorrhea, sinus pressure, sinus pain and sore throat. Negative for dental problem, drooling, ear discharge, ear pain, facial swelling, hearing loss, mouth sores, nosebleeds, sneezing, tinnitus, trouble swallowing and voice change.   Eyes: Negative.   Respiratory:  Positive for cough and shortness of breath. Negative for apnea, choking, chest tightness, wheezing and stridor.   Cardiovascular: Negative.   Gastrointestinal: Negative.   Musculoskeletal: Negative.   Psychiatric/Behavioral: Negative.      Per HPI unless specifically indicated above     Objective:    There were no vitals taken for this visit.  Wt Readings from Last 3 Encounters:  04/28/22 202 lb 6.4 oz (91.8 kg)  03/28/22 223 lb (101.2 kg)  03/22/22 209 lb 3.5 oz (94.9 kg)    Physical Exam Vitals and nursing note reviewed.  Constitutional:      General: She is not in acute distress.    Appearance: Normal appearance. She is obese. She is not ill-appearing, toxic-appearing or diaphoretic.  HENT:     Head: Normocephalic and atraumatic.     Right Ear: External ear normal.     Left Ear: External ear normal.     Nose: Nose normal.     Mouth/Throat:     Mouth: Mucous membranes are moist.     Pharynx: Oropharynx is clear.  Eyes:     General: No scleral icterus.       Right eye: No discharge.        Left eye: No discharge.     Conjunctiva/sclera: Conjunctivae normal.     Pupils: Pupils are equal, round, and reactive to light.  Pulmonary:     Effort: Pulmonary effort is normal. No respiratory distress.  Comments: Speaking in full sentences Musculoskeletal:        General: Normal range of motion.     Cervical back: Normal range of motion.  Skin:    Coloration: Skin is not jaundiced or pale.     Findings: No bruising, erythema, lesion or rash.  Neurological:     Mental Status: She is alert and oriented to person, place, and time. Mental status is at baseline.  Psychiatric:        Mood and Affect:  Mood normal.        Behavior: Behavior normal.        Thought Content: Thought content normal.        Judgment: Judgment normal.     Results for orders placed or performed during the hospital encounter of 03/22/22  Group A Strep by PCR   Specimen: Anterior Nasal Swab; Sterile Swab  Result Value Ref Range   Group A Strep by PCR NOT DETECTED NOT DETECTED  Resp panel by RT-PCR (RSV, Flu A&B, Covid) Anterior Nasal Swab   Specimen: Anterior Nasal Swab  Result Value Ref Range   SARS Coronavirus 2 by RT PCR NEGATIVE NEGATIVE   Influenza A by PCR NEGATIVE NEGATIVE   Influenza B by PCR NEGATIVE NEGATIVE   Resp Syncytial Virus by PCR NEGATIVE NEGATIVE      Assessment & Plan:   Problem List Items Addressed This Visit       Cardiovascular and Mediastinum   Primary hypertension    Tolerating medicine well. Will check labs and get her back in for BP in 2 weeks. Call with any concerns.       Relevant Orders   Comprehensive metabolic panel   CBC with Differential/Platelet   Other Visit Diagnoses     Acute non-recurrent maxillary sinusitis    -  Primary   Will treat with doxy and prednisone. Call with any concerns. Continue to monitor.   Relevant Medications   doxycycline (VIBRA-TABS) 100 MG tablet   predniSONE (DELTASONE) 10 MG tablet   Hypomagnesemia       Did not get labs done last visit- will go get them at the hospital now.   Relevant Orders   Comprehensive metabolic panel   Magnesium   CBC with Differential/Platelet   Hypocalcemia       Did not get labs done last visit- will go get them at the hospital now.   Relevant Orders   Comprehensive metabolic panel   CBC with Differential/Platelet   Cholestasis       Did not get labs done last visit- will go get them at the hospital now.   Relevant Orders   Comprehensive metabolic panel   CBC with Differential/Platelet   Hypokalemia       Did not get labs done last visit- will go get them at the hospital now.        Follow  up plan: Return in about 2 weeks (around 05/26/2022).    This visit was completed via video visit through MyChart due to the restrictions of the COVID-19 pandemic. All issues as above were discussed and addressed. Physical exam was done as above through visual confirmation on video through MyChart. If it was felt that the patient should be evaluated in the office, they were directed there. The patient verbally consented to this visit. Location of the patient: home Location of the provider: home Those involved with this call:  Provider: Park Liter, DO CMA: Irena Reichmann, [place] Desk/Registration: [employer]  Time spent on call:  25 minutes with patient face to face via video conference. More than 50% of this time was spent in counseling and coordination of care. 40 minutes total spent in review of patient's record and preparation of their chart.

## 2022-05-12 NOTE — Assessment & Plan Note (Signed)
Tolerating medicine well. Will check labs and get her back in for BP in 2 weeks. Call with any concerns.

## 2022-05-24 ENCOUNTER — Other Ambulatory Visit: Payer: Self-pay | Admitting: Family Medicine

## 2022-05-24 DIAGNOSIS — G4733 Obstructive sleep apnea (adult) (pediatric): Secondary | ICD-10-CM | POA: Diagnosis not present

## 2022-05-24 NOTE — Telephone Encounter (Signed)
Requested Prescriptions  Pending Prescriptions Disp Refills   VENTOLIN HFA 108 (90 Base) MCG/ACT inhaler [Pharmacy Med Name: VENTOLIN HFA INH W/DOS CTR 200PUFFS] 18 g 0    Sig: INHALE 2 PUFFS INTO THE LUNGS EVERY 6 HOURS AS NEEDED FOR WHEEZING OR SHORTNESS OF BREATH     Pulmonology:  Beta Agonists 2 Failed - 05/24/2022  3:33 AM      Failed - Last BP in normal range    BP Readings from Last 1 Encounters:  04/28/22 (!) 158/113         Failed - Last Heart Rate in normal range    Pulse Readings from Last 1 Encounters:  04/28/22 (!) 123         Passed - Valid encounter within last 12 months    Recent Outpatient Visits           1 week ago Acute non-recurrent maxillary sinusitis   [place], DO   3 weeks ago 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 [facility])   [place], Megan P, DO   1 month ago Acute non-recurrent maxillary sinusitis   [place], DO   2 months ago Routine general medical examination at a health care facility   [place], [place], DO   3 months ago Opioid withdrawal [facility])   [place], DO       Future Appointments             Tomorrow Valerie Roys, DO [place], [facility]

## 2022-05-25 ENCOUNTER — Ambulatory Visit: Payer: Medicaid Other | Admitting: Family Medicine

## 2022-06-05 ENCOUNTER — Other Ambulatory Visit: Payer: Self-pay | Admitting: Family Medicine

## 2022-06-05 NOTE — Telephone Encounter (Signed)
Requested medication (s) are due for refill today - provider review   Requested medication (s) are on the active medication list -yes  Future visit scheduled -yes  Last refill: 05/09/22  Notes to clinic: medication listed as historical- non delegated Rx  Requested Prescriptions  Pending Prescriptions Disp Refills   QUEtiapine (SEROQUEL) 25 MG tablet [Pharmacy Med Name: QUETIAPINE '25MG'$  TABLETS] 30 tablet     Sig: TAKE 1/2 TO 1 TABLET(12.5 TO 25 MG) BY MOUTH AT BEDTIME     Not Delegated - Psychiatry:  Antipsychotics - Second Generation (Atypical) - quetiapine Failed - 06/05/2022  3:32 AM      Failed - This refill cannot be delegated      Failed - TSH in normal range and within 360 days    TSH  Date Value Ref Range Status  03/02/2021 1.800 0.350 - 4.500 uIU/mL Final    Comment:    Performed by a 3rd Generation assay with a functional sensitivity of <=0.01 uIU/mL. Performed at [facility], [address] [place], [place] [address]   01/06/2016 0.852 0.450 - 4.500 uIU/mL Final         Failed - Last BP in normal range    BP Readings from Last 1 Encounters:  04/28/22 (!) 158/113         Failed - Last Heart Rate in normal range    Pulse Readings from Last 1 Encounters:  04/28/22 (!) 123         Failed - Lipid Panel in normal range within the last 12 months    Cholesterol, Total  Date Value Ref Range Status  01/06/2016 160 100 - 199 mg/dL Final   Cholesterol  Date Value Ref Range Status  03/02/2021 215 (H) 0 - 200 mg/dL Final   LDL Calculated  Date Value Ref Range Status  01/06/2016 60 0 - 99 mg/dL Final   LDL Cholesterol  Date Value Ref Range Status  03/02/2021 91 0 - 99 mg/dL Final    Comment:           Total Cholesterol/HDL:CHD Risk Coronary Heart Disease Risk Table                     Men   Women  1/2 Average Risk   3.4   3.3  Average Risk       5.0   4.4  2 X Average Risk   9.6   7.1  3 X Average Risk  23.4   11.0        Use the calculated  Patient Ratio above and the CHD Risk Table to determine the patient's CHD Risk.        ATP III CLASSIFICATION (LDL):  <100     mg/dL   Optimal  100-129  mg/dL   Near or Above                    Optimal  130-159  mg/dL   Borderline  160-189  mg/dL   High  >190     mg/dL   Very High Performed at [facility], [place], [place] [address]    HDL  Date Value Ref Range Status  03/02/2021 50 >40 mg/dL Final  01/06/2016 31 (L) >39 mg/dL Final   Triglycerides  Date Value Ref Range Status  03/02/2021 370 (H) <150 mg/dL Final         Failed - CBC within normal limits and completed in the  last 12 months    WBC  Date Value Ref Range Status  03/02/2021 10.2 4.0 - 10.5 K/uL Final   RBC  Date Value Ref Range Status  03/02/2021 4.56 3.87 - 5.11 MIL/uL Final   Hemoglobin  Date Value Ref Range Status  03/02/2021 15.0 12.0 - 15.0 g/dL Final  01/19/2016 11.8 11.1 - 15.9 g/dL Final   HCT  Date Value Ref Range Status  03/02/2021 42.9 36.0 - 46.0 % Final   Hematocrit  Date Value Ref Range Status  01/19/2016 38.3 34.0 - 46.6 % Final   MCHC  Date Value Ref Range Status  03/02/2021 35.0 30.0 - 36.0 g/dL Final   [facility]  Date Value Ref Range Status  03/02/2021 32.9 26.0 - 34.0 pg Final   MCV  Date Value Ref Range Status  03/02/2021 94.1 80.0 - 100.0 fL Final  01/19/2016 89 79 - 97 fL Final   No results found for: "PLTCOUNTKUC", "LABPLAT", "POCPLA" RDW  Date Value Ref Range Status  03/02/2021 14.4 11.5 - 15.5 % Final  01/19/2016 14.5 12.3 - 15.4 % Final         Failed - CMP within normal limits and completed in the last 12 months    Albumin  Date Value Ref Range Status  03/02/2021 3.7 3.5 - 5.0 g/dL Final  01/06/2016 4.2 3.5 - 5.5 g/dL Final   Alkaline Phosphatase  Date Value Ref Range Status  03/02/2021 88 38 - 126 U/L Final   ALT  Date Value Ref Range Status  03/02/2021 52 (H) 0 - 44 U/L Final   AST  Date Value Ref Range Status  03/02/2021  80 (H) 15 - 41 U/L Final   BUN  Date Value Ref Range Status  03/02/2021 5 (L) 6 - 20 mg/dL Final  01/06/2016 13 6 - 20 mg/dL Final   Calcium  Date Value Ref Range Status  03/02/2021 9.0 8.9 - 10.3 mg/dL Final   CO2  Date Value Ref Range Status  03/02/2021 30 22 - 32 mmol/L Final   Creatinine, Ser  Date Value Ref Range Status  03/02/2021 0.59 0.44 - 1.00 mg/dL Final   Glucose  Date Value Ref Range Status  10/15/2014 90 mg/dL Final   Glucose, Bld  Date Value Ref Range Status  03/02/2021 92 70 - 99 mg/dL Final    Comment:    Glucose reference range applies only to samples taken after fasting for at least 8 hours.   Potassium  Date Value Ref Range Status  03/02/2021 3.2 (L) 3.5 - 5.1 mmol/L Final   Sodium  Date Value Ref Range Status  03/02/2021 137 135 - 145 mmol/L Final  01/06/2016 140 134 - 144 mmol/L Final   Total Bilirubin  Date Value Ref Range Status  03/02/2021 0.9 0.3 - 1.2 mg/dL Final   Bilirubin Total  Date Value Ref Range Status  01/06/2016 0.2 0.0 - 1.2 mg/dL Final   Bilirubin, Total  Date Value Ref Range Status  10/15/2014 0.6 mg/dL Final   Protein, ur  Date Value Ref Range Status  03/02/2021 NEGATIVE NEGATIVE mg/dL Final   Protein,UA  Date Value Ref Range Status  05/16/2021 1+ (A) Negative/Trace Final   Total Protein  Date Value Ref Range Status  03/02/2021 7.0 6.5 - 8.1 g/dL Final  01/06/2016 7.0 6.0 - 8.5 g/dL Final   GFR calc Af Amer  Date Value Ref Range Status  12/10/2019 >60 >60 mL/min Final   GFR, Estimated  Date Value Ref Range  Status  03/02/2021 >60 >60 mL/min Final    Comment:    (NOTE) Calculated using the CKD-EPI Creatinine Equation (2021)          Passed - Completed PHQ-2 or PHQ-9 in the last 360 days      Passed - Valid encounter within last 6 months    Recent Outpatient Visits           3 weeks ago Acute non-recurrent maxillary sinusitis   [place], DO   1 month  ago 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 [facility])   [place], Megan P, DO   2 months ago Acute non-recurrent maxillary sinusitis   [place], DO   2 months ago Routine general medical examination at a health care facility   [facility], Megan P, DO   3 months ago Opioid withdrawal [facility])   [place], Barb Merino, DO       Future Appointments             In 2 weeks Wynetta Emery, Barb Merino, DO [place], [facility]               Requested Prescriptions  Pending Prescriptions Disp Refills   QUEtiapine (SEROQUEL) 25 MG tablet [Pharmacy Med Name: QUETIAPINE '25MG'$  TABLETS] 30 tablet     Sig: TAKE 1/2 TO 1 TABLET(12.5 TO 25 MG) BY MOUTH AT BEDTIME     Not Delegated - Psychiatry:  Antipsychotics - Second Generation (Atypical) - quetiapine Failed - 06/05/2022  3:32 AM      Failed - This refill cannot be delegated      Failed - TSH in normal range and within 360 days    TSH  Date Value Ref Range Status  03/02/2021 1.800 0.350 - 4.500 uIU/mL Final    Comment:    Performed by a 3rd Generation assay with a functional sensitivity of <=0.01 uIU/mL. Performed at [facility], [address] [place], [place] [address]   01/06/2016 0.852 0.450 - 4.500 uIU/mL Final         Failed - Last BP in normal range    BP Readings from Last 1 Encounters:  04/28/22 (!) 158/113         Failed - Last Heart Rate in normal range    Pulse Readings from Last 1 Encounters:  04/28/22 (!) 123         Failed - Lipid Panel in normal range within the last 12 months    Cholesterol, Total  Date Value Ref Range Status  01/06/2016 160 100 - 199 mg/dL Final   Cholesterol  Date Value Ref Range Status  03/02/2021 215 (H) 0 - 200 mg/dL Final   LDL Calculated  Date Value Ref Range Status  01/06/2016 60 0 - 99 mg/dL Final   LDL  Cholesterol  Date Value Ref Range Status  03/02/2021 91 0 - 99 mg/dL Final    Comment:           Total Cholesterol/HDL:CHD Risk Coronary Heart Disease Risk Table                     Men   Women  1/2 Average Risk   3.4   3.3  Average Risk       5.0   4.4  2 X Average Risk   9.6  7.1  3 X Average Risk  23.4   11.0        Use the calculated Patient Ratio above and the CHD Risk Table to determine the patient's CHD Risk.        ATP III CLASSIFICATION (LDL):  <100     mg/dL   Optimal  100-129  mg/dL   Near or Above                    Optimal  130-159  mg/dL   Borderline  160-189  mg/dL   High  >190     mg/dL   Very High Performed at [facility], [address] [place], [place] [address]    HDL  Date Value Ref Range Status  03/02/2021 50 >40 mg/dL Final  01/06/2016 31 (L) >39 mg/dL Final   Triglycerides  Date Value Ref Range Status  03/02/2021 370 (H) <150 mg/dL Final         Failed - CBC within normal limits and completed in the last 12 months    WBC  Date Value Ref Range Status  03/02/2021 10.2 4.0 - 10.5 K/uL Final   RBC  Date Value Ref Range Status  03/02/2021 4.56 3.87 - 5.11 MIL/uL Final   Hemoglobin  Date Value Ref Range Status  03/02/2021 15.0 12.0 - 15.0 g/dL Final  01/19/2016 11.8 11.1 - 15.9 g/dL Final   HCT  Date Value Ref Range Status  03/02/2021 42.9 36.0 - 46.0 % Final   Hematocrit  Date Value Ref Range Status  01/19/2016 38.3 34.0 - 46.6 % Final   MCHC  Date Value Ref Range Status  03/02/2021 35.0 30.0 - 36.0 g/dL Final   [facility]  Date Value Ref Range Status  03/02/2021 32.9 26.0 - 34.0 pg Final   MCV  Date Value Ref Range Status  03/02/2021 94.1 80.0 - 100.0 fL Final  01/19/2016 89 79 - 97 fL Final   No results found for: "PLTCOUNTKUC", "LABPLAT", "POCPLA" RDW  Date Value Ref Range Status  03/02/2021 14.4 11.5 - 15.5 % Final  01/19/2016 14.5 12.3 - 15.4 % Final         Failed - CMP within normal limits and completed  in the last 12 months    Albumin  Date Value Ref Range Status  03/02/2021 3.7 3.5 - 5.0 g/dL Final  01/06/2016 4.2 3.5 - 5.5 g/dL Final   Alkaline Phosphatase  Date Value Ref Range Status  03/02/2021 88 38 - 126 U/L Final   ALT  Date Value Ref Range Status  03/02/2021 52 (H) 0 - 44 U/L Final   AST  Date Value Ref Range Status  03/02/2021 80 (H) 15 - 41 U/L Final   BUN  Date Value Ref Range Status  03/02/2021 5 (L) 6 - 20 mg/dL Final  01/06/2016 13 6 - 20 mg/dL Final   Calcium  Date Value Ref Range Status  03/02/2021 9.0 8.9 - 10.3 mg/dL Final   CO2  Date Value Ref Range Status  03/02/2021 30 22 - 32 mmol/L Final   Creatinine, Ser  Date Value Ref Range Status  03/02/2021 0.59 0.44 - 1.00 mg/dL Final   Glucose  Date Value Ref Range Status  10/15/2014 90 mg/dL Final   Glucose, Bld  Date Value Ref Range Status  03/02/2021 92 70 - 99 mg/dL Final    Comment:    Glucose reference range applies only to samples taken after fasting for at least 8  hours.   Potassium  Date Value Ref Range Status  03/02/2021 3.2 (L) 3.5 - 5.1 mmol/L Final   Sodium  Date Value Ref Range Status  03/02/2021 137 135 - 145 mmol/L Final  01/06/2016 140 134 - 144 mmol/L Final   Total Bilirubin  Date Value Ref Range Status  03/02/2021 0.9 0.3 - 1.2 mg/dL Final   Bilirubin Total  Date Value Ref Range Status  01/06/2016 0.2 0.0 - 1.2 mg/dL Final   Bilirubin, Total  Date Value Ref Range Status  10/15/2014 0.6 mg/dL Final   Protein, ur  Date Value Ref Range Status  03/02/2021 NEGATIVE NEGATIVE mg/dL Final   Protein,UA  Date Value Ref Range Status  05/16/2021 1+ (A) Negative/Trace Final   Total Protein  Date Value Ref Range Status  03/02/2021 7.0 6.5 - 8.1 g/dL Final  01/06/2016 7.0 6.0 - 8.5 g/dL Final   GFR calc Af Amer  Date Value Ref Range Status  12/10/2019 >60 >60 mL/min Final   GFR, Estimated  Date Value Ref Range Status  03/02/2021 >60 >60 mL/min Final    Comment:     (NOTE) Calculated using the CKD-EPI Creatinine Equation (2021)          Passed - Completed PHQ-2 or PHQ-9 in the last 360 days      Passed - Valid encounter within last 6 months    Recent Outpatient Visits           3 weeks ago Acute non-recurrent maxillary sinusitis   [place], DO   1 month ago 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

## 2022-06-21 ENCOUNTER — Encounter: Payer: Self-pay | Admitting: Family Medicine

## 2022-06-21 ENCOUNTER — Inpatient Hospital Stay
Admission: EM | Admit: 2022-06-21 | Discharge: 2022-06-24 | DRG: 640 | Disposition: A | Discharge status: Home / Self Care | Payer: Medicaid Other | Attending: Family Medicine | Admitting: Family Medicine

## 2022-06-21 ENCOUNTER — Other Ambulatory Visit: Payer: Self-pay

## 2022-06-21 ENCOUNTER — Emergency Department: Payer: Medicaid Other

## 2022-06-21 ENCOUNTER — Ambulatory Visit: Payer: Medicaid Other | Admitting: Family Medicine

## 2022-06-21 DIAGNOSIS — J9601 Acute respiratory failure with hypoxia: Secondary | ICD-10-CM | POA: Diagnosis not present

## 2022-06-21 DIAGNOSIS — Z833 Family history of diabetes mellitus: Secondary | ICD-10-CM

## 2022-06-21 DIAGNOSIS — K429 Umbilical hernia without obstruction or gangrene: Secondary | ICD-10-CM | POA: Diagnosis present

## 2022-06-21 DIAGNOSIS — J9811 Atelectasis: Secondary | ICD-10-CM | POA: Diagnosis not present

## 2022-06-21 DIAGNOSIS — Z975 Presence of (intrauterine) contraceptive device: Secondary | ICD-10-CM | POA: Diagnosis not present

## 2022-06-21 DIAGNOSIS — Z30431 Encounter for routine checking of intrauterine contraceptive device: Secondary | ICD-10-CM | POA: Diagnosis not present

## 2022-06-21 DIAGNOSIS — J449 Chronic obstructive pulmonary disease, unspecified: Secondary | ICD-10-CM | POA: Diagnosis present

## 2022-06-21 DIAGNOSIS — R9431 Abnormal electrocardiogram [ECG] [EKG]: Secondary | ICD-10-CM

## 2022-06-21 DIAGNOSIS — R29818 Other symptoms and signs involving the nervous system: Secondary | ICD-10-CM | POA: Diagnosis not present

## 2022-06-21 DIAGNOSIS — Z1152 Encounter for screening for COVID-19: Secondary | ICD-10-CM

## 2022-06-21 DIAGNOSIS — E871 Hypo-osmolality and hyponatremia: Secondary | ICD-10-CM | POA: Diagnosis present

## 2022-06-21 DIAGNOSIS — F419 Anxiety disorder, unspecified: Secondary | ICD-10-CM | POA: Diagnosis present

## 2022-06-21 DIAGNOSIS — K760 Fatty (change of) liver, not elsewhere classified: Secondary | ICD-10-CM | POA: Diagnosis present

## 2022-06-21 DIAGNOSIS — F1310 Sedative, hypnotic or anxiolytic abuse, uncomplicated: Secondary | ICD-10-CM | POA: Diagnosis present

## 2022-06-21 DIAGNOSIS — Z803 Family history of malignant neoplasm of breast: Secondary | ICD-10-CM | POA: Diagnosis not present

## 2022-06-21 DIAGNOSIS — Z79899 Other long term (current) drug therapy: Secondary | ICD-10-CM

## 2022-06-21 DIAGNOSIS — E876 Hypokalemia: Principal | ICD-10-CM | POA: Diagnosis present

## 2022-06-21 DIAGNOSIS — Z8249 Family history of ischemic heart disease and other diseases of the circulatory system: Secondary | ICD-10-CM

## 2022-06-21 DIAGNOSIS — Z87891 Personal history of nicotine dependence: Secondary | ICD-10-CM

## 2022-06-21 DIAGNOSIS — R1012 Left upper quadrant pain: Secondary | ICD-10-CM | POA: Diagnosis not present

## 2022-06-21 DIAGNOSIS — K219 Gastro-esophageal reflux disease without esophagitis: Secondary | ICD-10-CM | POA: Diagnosis present

## 2022-06-21 DIAGNOSIS — Z9049 Acquired absence of other specified parts of digestive tract: Secondary | ICD-10-CM

## 2022-06-21 DIAGNOSIS — F1010 Alcohol abuse, uncomplicated: Secondary | ICD-10-CM | POA: Diagnosis present

## 2022-06-21 DIAGNOSIS — G4700 Insomnia, unspecified: Secondary | ICD-10-CM | POA: Diagnosis present

## 2022-06-21 DIAGNOSIS — F32A Depression, unspecified: Secondary | ICD-10-CM | POA: Diagnosis present

## 2022-06-21 DIAGNOSIS — Z800 Family history of malignant neoplasm of digestive organs: Secondary | ICD-10-CM | POA: Diagnosis not present

## 2022-06-21 DIAGNOSIS — K529 Noninfective gastroenteritis and colitis, unspecified: Secondary | ICD-10-CM | POA: Diagnosis present

## 2022-06-21 DIAGNOSIS — R109 Unspecified abdominal pain: Principal | ICD-10-CM

## 2022-06-21 DIAGNOSIS — R079 Chest pain, unspecified: Secondary | ICD-10-CM | POA: Diagnosis not present

## 2022-06-21 DIAGNOSIS — R0602 Shortness of breath: Secondary | ICD-10-CM | POA: Diagnosis not present

## 2022-06-21 DIAGNOSIS — E860 Dehydration: Secondary | ICD-10-CM | POA: Diagnosis present

## 2022-06-21 DIAGNOSIS — F339 Major depressive disorder, recurrent, unspecified: Secondary | ICD-10-CM | POA: Diagnosis present

## 2022-06-21 DIAGNOSIS — I2489 Other forms of acute ischemic heart disease: Secondary | ICD-10-CM | POA: Diagnosis present

## 2022-06-21 DIAGNOSIS — R1013 Epigastric pain: Secondary | ICD-10-CM | POA: Diagnosis not present

## 2022-06-21 DIAGNOSIS — F1210 Cannabis abuse, uncomplicated: Secondary | ICD-10-CM | POA: Diagnosis present

## 2022-06-21 DIAGNOSIS — B182 Chronic viral hepatitis C: Secondary | ICD-10-CM | POA: Diagnosis present

## 2022-06-21 DIAGNOSIS — I4581 Long QT syndrome: Secondary | ICD-10-CM | POA: Diagnosis not present

## 2022-06-21 DIAGNOSIS — I10 Essential (primary) hypertension: Secondary | ICD-10-CM | POA: Diagnosis not present

## 2022-06-21 LAB — CBC WITH DIFFERENTIAL/PLATELET
Abs Immature Granulocytes: 0.06 10*3/uL (ref 0.00–0.07)
Basophils Absolute: 0.1 10*3/uL (ref 0.0–0.1)
Basophils Relative: 1 %
Eosinophils Absolute: 0 10*3/uL (ref 0.0–0.5)
Eosinophils Relative: 0 %
HCT: 40.1 % (ref 36.0–46.0)
Hemoglobin: 14.2 g/dL (ref 12.0–15.0)
Immature Granulocytes: 0 %
Lymphocytes Relative: 15 %
Lymphs Abs: 2.1 10*3/uL (ref 0.7–4.0)
MCH: 34 pg (ref 26.0–34.0)
MCHC: 35.4 g/dL (ref 30.0–36.0)
MCV: 95.9 fL (ref 80.0–100.0)
Monocytes Absolute: 1 10*3/uL (ref 0.1–1.0)
Monocytes Relative: 7 %
Neutro Abs: 10.4 10*3/uL — ABNORMAL HIGH (ref 1.7–7.7)
Neutrophils Relative %: 77 %
Platelets: 283 10*3/uL (ref 150–400)
RBC: 4.18 MIL/uL (ref 3.87–5.11)
RDW: 14.6 % (ref 11.5–15.5)
WBC: 13.6 10*3/uL — ABNORMAL HIGH (ref 4.0–10.5)
nRBC: 0.2 % (ref 0.0–0.2)

## 2022-06-21 LAB — C DIFFICILE QUICK SCREEN W PCR REFLEX
C Diff antigen: NEGATIVE
C Diff interpretation: NOT DETECTED
C Diff toxin: NEGATIVE

## 2022-06-21 LAB — GASTROINTESTINAL PANEL BY PCR, STOOL (REPLACES STOOL CULTURE)

## 2022-06-21 LAB — COMPREHENSIVE METABOLIC PANEL
ALT: 71 U/L — ABNORMAL HIGH (ref 0–44)
AST: 165 U/L — ABNORMAL HIGH (ref 15–41)
Albumin: 2.9 g/dL — ABNORMAL LOW (ref 3.5–5.0)
Alkaline Phosphatase: 231 U/L — ABNORMAL HIGH (ref 38–126)
Anion gap: 18 — ABNORMAL HIGH (ref 5–15)
BUN: <5 mg/dL — ABNORMAL LOW (ref 6–20)
CO2: 34 mmol/L — ABNORMAL HIGH (ref 22–32)
Calcium: 7.5 mg/dL — ABNORMAL LOW (ref 8.9–10.3)
Chloride: 79 mmol/L — ABNORMAL LOW (ref 98–111)
Creatinine, Ser: 1.12 mg/dL — ABNORMAL HIGH (ref 0.44–1.00)
GFR, Estimated: >60 mL/min (ref >60)
Glucose, Bld: 98 mg/dL (ref 70–99)
Potassium: <2 mmol/L — CL (ref 3.5–5.1)
Sodium: 131 mmol/L — ABNORMAL LOW (ref 135–145)
Total Bilirubin: 4.7 mg/dL — ABNORMAL HIGH (ref 0.3–1.2)
Total Protein: 6.4 g/dL — ABNORMAL LOW (ref 6.5–8.1)

## 2022-06-21 LAB — URINE DRUG SCREEN, QUALITATIVE (ARMC ONLY)
Amphetamines, Ur Screen: NOT DETECTED
Barbiturates, Ur Screen: NOT DETECTED
Benzodiazepine, Ur Scrn: POSITIVE — AB
Cannabinoid 50 Ng, Ur ~~LOC~~: POSITIVE — AB
Cocaine Metabolite,Ur ~~LOC~~: NOT DETECTED
MDMA (Ecstasy)Ur Screen: NOT DETECTED
Methadone Scn, Ur: NOT DETECTED
Opiate, Ur Screen: NOT DETECTED
Phencyclidine (PCP) Ur S: NOT DETECTED
Tricyclic, Ur Screen: NOT DETECTED

## 2022-06-21 LAB — URINALYSIS, ROUTINE W REFLEX MICROSCOPIC
Glucose, UA: NEGATIVE mg/dL
Ketones, ur: 5 mg/dL — AB
Leukocytes,Ua: NEGATIVE
Nitrite: NEGATIVE
Protein, ur: 100 mg/dL — AB
Specific Gravity, Urine: 1.025 (ref 1.005–1.030)
Squamous Epithelial / HPF: >50 /HPF (ref 0–5)
pH: 5 (ref 5.0–8.0)

## 2022-06-21 LAB — RESP PANEL BY RT-PCR (RSV, FLU A&B, COVID)  RVPGX2
Influenza A by PCR: NEGATIVE
Influenza B by PCR: NEGATIVE
Resp Syncytial Virus by PCR: NEGATIVE
SARS Coronavirus 2 by RT PCR: NEGATIVE

## 2022-06-21 LAB — MAGNESIUM: Magnesium: 1.2 mg/dL — ABNORMAL LOW (ref 1.7–2.4)

## 2022-06-21 LAB — HCG, QUANTITATIVE, PREGNANCY: hCG, Beta Chain, Quant, S: <1 m[IU]/mL (ref <5)

## 2022-06-21 LAB — TROPONIN I (HIGH SENSITIVITY)
Troponin I (High Sensitivity): 25 ng/L — ABNORMAL HIGH (ref <18)
Troponin I (High Sensitivity): 24 ng/L — ABNORMAL HIGH (ref <18)

## 2022-06-21 LAB — ETHANOL: Alcohol, Ethyl (B): <10 mg/dL (ref <10)

## 2022-06-21 LAB — LIPASE, BLOOD: Lipase: 35 U/L (ref 11–51)

## 2022-06-21 LAB — PREGNANCY, URINE: Preg Test, Ur: NEGATIVE

## 2022-06-21 MED ORDER — HYDROMORPHONE HCL 1 MG/ML IJ SOLN
0.5000 mg | Freq: Once | INTRAMUSCULAR | Status: AC
  Administered 2022-06-21: 0.5 mg via INTRAVENOUS
  Filled 2022-06-21: qty 0.5

## 2022-06-21 MED ORDER — ACETAMINOPHEN 650 MG RE SUPP: 650.0000 mg | Freq: Four times a day (QID) | RECTAL | Status: DC | PRN

## 2022-06-21 MED ORDER — ADULT MULTIVITAMIN W/MINERALS CH
1.0000 | ORAL_TABLET | Freq: Every day | ORAL | Status: DC
  Administered 2022-06-21 – 2022-06-24 (×4): 1 via ORAL
  Filled 2022-06-21 (×4): qty 1

## 2022-06-21 MED ORDER — PANTOPRAZOLE SODIUM 40 MG IV SOLR
40.0000 mg | Freq: Once | INTRAVENOUS | Status: AC
  Administered 2022-06-21: 40 mg via INTRAVENOUS
  Filled 2022-06-21: qty 10

## 2022-06-21 MED ORDER — SODIUM CHLORIDE 0.9 % IV BOLUS
1000.0000 mL | Freq: Once | INTRAVENOUS | Status: AC
  Administered 2022-06-21: 1000 mL via INTRAVENOUS

## 2022-06-21 MED ORDER — LORAZEPAM 1 MG PO TABS
1.0000 mg | ORAL_TABLET | ORAL | Status: DC | PRN
  Administered 2022-06-22 – 2022-06-23 (×4): 1 mg via ORAL
  Filled 2022-06-21 (×4): qty 1

## 2022-06-21 MED ORDER — ENOXAPARIN SODIUM 60 MG/0.6ML IJ SOSY
50.0000 mg | PREFILLED_SYRINGE | INTRAMUSCULAR | Status: DC
  Administered 2022-06-21: 50 mg via SUBCUTANEOUS
  Filled 2022-06-21: qty 0.6

## 2022-06-21 MED ORDER — POTASSIUM CHLORIDE IN NACL 20-0.9 MEQ/L-% IV SOLN
INTRAVENOUS | Status: DC
  Filled 2022-06-21 (×8): qty 1000

## 2022-06-21 MED ORDER — ACETAMINOPHEN 325 MG PO TABS: 650.0000 mg | ORAL_TABLET | Freq: Four times a day (QID) | ORAL | Status: DC | PRN

## 2022-06-21 MED ORDER — FOLIC ACID 1 MG PO TABS
1.0000 mg | ORAL_TABLET | Freq: Every day | ORAL | Status: DC
  Administered 2022-06-21 – 2022-06-24 (×4): 1 mg via ORAL
  Filled 2022-06-21 (×4): qty 1

## 2022-06-21 MED ORDER — IOHEXOL 350 MG/ML SOLN
100.0000 mL | Freq: Once | INTRAVENOUS | Status: AC | PRN
  Administered 2022-06-21: 100 mL via INTRAVENOUS

## 2022-06-21 MED ORDER — LORAZEPAM 2 MG/ML IJ SOLN
0.5000 mg | Freq: Once | INTRAMUSCULAR | Status: AC
  Administered 2022-06-21: 0.5 mg via INTRAVENOUS
  Filled 2022-06-21: qty 1

## 2022-06-21 MED ORDER — FOLIC ACID 1 MG PO TABS: 1.0000 mg | ORAL_TABLET | Freq: Every day | ORAL | Status: DC

## 2022-06-21 MED ORDER — DOCUSATE SODIUM 100 MG PO CAPS
100.0000 mg | ORAL_CAPSULE | Freq: Two times a day (BID) | ORAL | Status: DC
  Filled 2022-06-21: qty 1

## 2022-06-21 MED ORDER — LORAZEPAM 2 MG/ML IJ SOLN
1.0000 mg | INTRAMUSCULAR | Status: DC | PRN
  Administered 2022-06-23: 1 mg via INTRAVENOUS
  Filled 2022-06-21: qty 1

## 2022-06-21 MED ORDER — THIAMINE HCL 100 MG/ML IJ SOLN: 100.0000 mg | Freq: Every day | INTRAMUSCULAR | Status: DC

## 2022-06-21 MED ORDER — PROMETHAZINE HCL 25 MG PO TABS: 12.5000 mg | ORAL_TABLET | Freq: Four times a day (QID) | ORAL | Status: DC | PRN

## 2022-06-21 MED ORDER — POTASSIUM CHLORIDE CRYS ER 20 MEQ PO TBCR
40.0000 meq | EXTENDED_RELEASE_TABLET | Freq: Once | ORAL | Status: AC
  Administered 2022-06-21: 40 meq via ORAL
  Filled 2022-06-21: qty 2

## 2022-06-21 MED ORDER — THIAMINE MONONITRATE 100 MG PO TABS: 100.0000 mg | ORAL_TABLET | Freq: Every day | ORAL | Status: DC

## 2022-06-21 MED ORDER — MAGNESIUM SULFATE 2 GM/50ML IV SOLN
2.0000 g | Freq: Once | INTRAVENOUS | Status: AC
  Administered 2022-06-21: 2 g via INTRAVENOUS
  Filled 2022-06-21: qty 50

## 2022-06-21 MED ORDER — POTASSIUM CHLORIDE 10 MEQ/100ML IV SOLN
10.0000 meq | INTRAVENOUS | Status: AC
  Administered 2022-06-21 (×4): 10 meq via INTRAVENOUS
  Filled 2022-06-21 (×3): qty 100

## 2022-06-21 MED ORDER — ONDANSETRON HCL 4 MG/2ML IJ SOLN: 4.0000 mg | Freq: Once | INTRAMUSCULAR | Status: DC

## 2022-06-21 MED ORDER — THIAMINE MONONITRATE 100 MG PO TABS
100.0000 mg | ORAL_TABLET | Freq: Every day | ORAL | Status: DC
  Administered 2022-06-21 – 2022-06-24 (×4): 100 mg via ORAL
  Filled 2022-06-21 (×4): qty 1

## 2022-06-21 MED ORDER — HYDROCODONE-ACETAMINOPHEN 5-325 MG PO TABS
1.0000 | ORAL_TABLET | ORAL | Status: DC | PRN
  Administered 2022-06-21 – 2022-06-22 (×4): 2 via ORAL
  Administered 2022-06-23: 1 via ORAL
  Administered 2022-06-23: 2 via ORAL
  Filled 2022-06-21 (×2): qty 2
  Filled 2022-06-21: qty 1
  Filled 2022-06-21 (×3): qty 2

## 2022-06-21 MED ORDER — ORAL CARE MOUTH RINSE: 15.0000 mL | OROMUCOSAL | Status: DC | PRN

## 2022-06-21 MED ORDER — HYDRALAZINE HCL 20 MG/ML IJ SOLN: 10.0000 mg | Freq: Three times a day (TID) | INTRAMUSCULAR | Status: DC | PRN

## 2022-06-21 NOTE — ED Notes (Signed)
Patient transported to CT 

## 2022-06-21 NOTE — H&P (Addendum)
History and Physical    Katie Stanley [id number] DOB: 1986-03-09 DOA: 06/21/2022  PCP: Valerie Roys, DO   Patient coming from: Home  I have personally briefly reviewed patient's old medical records in [facility]  Chief Complaint: Nausea, Vomiting, LUQ pain.  HPI: Katie Stanley is a 37 y.o. female with PMH significant for EtOH abuse, tobacco abuse, depression/anxiety, substance abuse presented in the ED with complaints of nausea, vomiting, left upper quadrant abdominal pain for last 4 days.  Patient denies any fever, sick contacts, recent travel.  She describes abdominal pain as constant radiating towards the back associated with nausea and vomiting.  She continued to drink alcohol despite having abdominal pain.  She reports been using 1 pint of whiskey every day, today her abdominal pain was worse so she came in the ED.  ED Course: She was tachycardic other vitals were stable. Temp 98.4, HR 108, RR 18, BP 123/80, SpO2 96% on 2 L. Labs include sodium 131, potassium 2.0, chloride 79, bicarb 34, glucose 98, BUN 5, creatinine 1.12, calcium 7.5, anion gap 18, magnesium 1.2, alkaline phosphate 231, albumin 2.9, lipase 35, AST 165, ALT 71, total protein 6.4, total bilirubin 4.7, troponin 25, WBC 13.6, hemoglobin 14.2, hemodynamic to crit 40.1, platelet 283, COVID negative, influenza negative, pregnancy test negative, urine unremarkable, urine drug screen positive for benzos, cannabinoids.  CT Chest /Abd: Negative for acute PE or thoracic aortic dissection. Wall thickening and mucosal enhancement in the right lower quadrant small bowel suggesting enteritis.Hepatomegaly with diffuse fatty infiltration. Small paraumbilical hernia containing only mesenteric fat. Healing anterior right rib fractures.  Review of Systems:    Review of Systems  Constitutional:  Positive for malaise/fatigue.  HENT: Negative.    Eyes: Negative.   Respiratory: Negative.    Cardiovascular: Negative.    Gastrointestinal:  Positive for abdominal pain, nausea and vomiting.  Genitourinary: Negative.   Musculoskeletal: Negative.   Skin: Negative.   Neurological: Negative.   Endo/Heme/Allergies: Negative.   Psychiatric/Behavioral:  Positive for depression and substance abuse. The patient is nervous/anxious and has insomnia.     Past Medical History:  Diagnosis Date   Anxiety    Biliary calculi 12/07/2009   Cholelithiasis    Depression    GERD (gastroesophageal reflux disease)    Hepatitis C    body "self healed" per patient   Palpitations    Retained intrauterine contraceptive device (IUD) 01/20/2020   Substance abuse ([place])     Past Surgical History:  Procedure Laterality Date   CHOLECYSTECTOMY     2011   ESOPHAGOGASTRODUODENOSCOPY N/A 09/29/2021   Procedure: ESOPHAGOGASTRODUODENOSCOPY (EGD);  Surgeon: Lucilla Lame, MD;  Location: [facility] ENDOSCOPY;  Service: Endoscopy;  Laterality: N/A;   ESOPHAGOGASTRODUODENOSCOPY (EGD) WITH PROPOFOL N/A 01/21/2018   Procedure: ESOPHAGOGASTRODUODENOSCOPY (EGD) WITH Biopsy;  Surgeon: Lucilla Lame, MD;  Location: [facility];  Service: Endoscopy;  Laterality: N/A;   HYSTEROSCOPY WITH D & C N/A 01/20/2020   Procedure: DILATATION AND CURETTAGE /HYSTEROSCOPY;  Surgeon: Will Bonnet, MD;  Location: [facility] ORS;  Service: Gynecology;  Laterality: N/A;   INTRAUTERINE DEVICE (IUD) INSERTION N/A 01/20/2020   Procedure: INTRAUTERINE DEVICE (IUD) INSERTION MIRENA IUD;  Surgeon: Will Bonnet, MD;  Location: [facility] ORS;  Service: Gynecology;  Laterality: N/A;   IUD REMOVAL N/A 01/20/2020   Procedure: INTRAUTERINE DEVICE (IUD) REMOVAL;  Surgeon: Will Bonnet, MD;  Location: [facility] ORS;  Service: Gynecology;  Laterality: N/A;   PORT-A-CATH REMOVAL  02/08/2022   PORTA CATH  INSERTION N/A 01/01/2019   Procedure: PORTA CATH INSERTION;  Surgeon: Algernon Huxley, MD;  Location: [place] [facility];  Service: Cardiovascular;  Laterality: N/A;      reports that she has been smoking cigarettes. She has a 12.00 pack-year smoking history. She has never used smokeless tobacco. She reports current alcohol use. She reports current drug use. Drug: Marijuana.  Allergies  Allergen Reactions   Penicillins Swelling    Family History  Problem Relation Age of Onset   Hypertension Mother    Diabetes Father    Hypertension Father    Liver cancer Maternal Grandmother    Hypertension Maternal Grandfather    Breast cancer Paternal Grandmother    Heart disease Paternal Grandfather    Family history reviewed and not pertinent.  Prior to Admission medications   Medication Sig Start Date End Date Taking? Authorizing Provider  albuterol (VENTOLIN HFA) 108 (90 Base) MCG/ACT inhaler INHALE 2 PUFFS INTO THE LUNGS EVERY 6 HOURS AS NEEDED FOR WHEEZING OR SHORTNESS OF BREATH 05/24/22   Johnson, Megan P, DO  citalopram (CELEXA) 20 MG tablet TAKE 1 TABLET(20 MG) BY MOUTH DAILY 02/14/22   Johnson, Megan P, DO  clotrimazole-betamethasone (LOTRISONE) cream Apply 1 Application topically daily. 03/14/22   Johnson, Megan P, DO  doxycycline (VIBRA-TABS) 100 MG tablet Take 1 tablet (100 mg total) by mouth 2 (two) times daily. 05/12/22   Johnson, Megan P, DO  lidocaine (LIDODERM) 5 % Place 1 patch onto the skin daily. Remove & Discard patch within 12 hours or as directed by MD 04/28/22   Park Liter P, DO  lidocaine (XYLOCAINE) 2 % solution Use as directed 15 mLs in the mouth or throat every 3 (three) hours as needed for mouth pain (swish and spit). 03/22/22   Laurene Footman B, PA-C  lisinopril (ZESTRIL) 10 MG tablet Take 1 tablet (10 mg total) by mouth daily. 04/28/22   Johnson, Megan P, DO  magnesium oxide (MAG-OX) 400 (240 Mg) MG tablet Take 1 tablet by mouth 2 (two) times daily. 04/13/22   [provider]  naltrexone (DEPADE) 50 MG tablet Take 1 tablet (50 mg total) by mouth daily. Do not take any opiods with this medicine or you will go into withdrawal 04/28/22    Wynetta Emery, Megan P, DO  Naphazoline HCl (CLEAR EYES OP) Place 1 drop into both eyes daily as needed (itching).    [provider]  pantoprazole (PROTONIX) 40 MG tablet Take 1 tablet (40 mg total) by mouth daily. 02/14/22   Johnson, Megan P, DO  predniSONE (DELTASONE) 10 MG tablet 6 tabs in the AM day 1 and 2, 5 tabs days 3 and 4, decrease by 1 every other day until gone. 05/12/22   Johnson, Megan P, DO  QUEtiapine (SEROQUEL) 25 MG tablet TAKE 1/2 TO 1 TABLET(12.5 TO 25 MG) BY MOUTH AT BEDTIME 06/11/22   Johnson, Megan P, DO  Vitamin D, Ergocalciferol, (DRISDOL) 1.25 MG (50000 UNIT) CAPS capsule Take 50,000 Units by mouth once a week. 04/13/22   [provider]    Physical Exam: Vitals:   06/21/22 1341 06/21/22 1400 06/21/22 1600 06/21/22 1635  BP: 113/67 117/69 118/68   Pulse: 97 (!) 103 90   Resp: (!) '23 11 19   '$ Temp:    98.8 F (37.1 C)  TempSrc:    Oral  SpO2: 93% 90% 95%   Weight:      Height:        Constitutional: Appears comfortable, not in  any acute distress, deconditioned. Vitals:   06/21/22 1341 06/21/22 1400 06/21/22 1600 06/21/22 1635  BP: 113/67 117/69 118/68   Pulse: 97 (!) 103 90   Resp: (!) '23 11 19   '$ Temp:    98.8 F (37.1 C)  TempSrc:    Oral  SpO2: 93% 90% 95%   Weight:      Height:       Eyes: PERRL, lids and conjunctivae normal ENMT: Mucous membranes are moist.  Pharynx without exudate, poor dentition. Neck: normal, supple, no masses, no thyromegaly Respiratory: CTA bilaterally, respiratory for normal, no accessory muscle use, RR 15 Cardiovascular: S1-S2 heard, regular rate and rhythm, no murmur. Abdomen: Abdomen is soft, mildly distended, mildly tender in the left upper quadrant.  BS+ Musculoskeletal: No joint deformity upper and lower extremities. Good ROM, no contractures. Normal muscle tone.  Skin: no rashes, lesions, ulcers. No induration Neurologic: CN 2-12 grossly intact. Sensation intact, DTR normal. Strength 5/5 in all 4.   Psychiatric: Normal judgment and insight. Alert and oriented x 3. Normal mood.     Labs on Admission: I have personally reviewed following labs and imaging studies  CBC: Recent Labs  Lab 06/21/22 1007  WBC 13.6*  NEUTROABS 10.4*  HGB 14.2  HCT 40.1  MCV 95.9  PLT [id number]   Basic Metabolic Panel: Recent Labs  Lab 06/21/22 1226 06/21/22 1451  NA  --  131*  K  --  <2.0*  CL  --  79*  CO2  --  34*  GLUCOSE  --  98  BUN  --  <5*  CREATININE  --  1.12*  CALCIUM  --  7.5*  MG 1.2*  --    GFR: Estimated Creatinine Clearance: 77.4 mL/min (A) (by C-G formula based on SCr of 1.12 mg/dL (H)). Liver Function Tests: Recent Labs  Lab 06/21/22 1451  AST 165*  ALT 71*  ALKPHOS 231*  BILITOT 4.7*  PROT 6.4*  ALBUMIN 2.9*   Recent Labs  Lab 06/21/22 1451  LIPASE 35   No results for input(s): "AMMONIA" in the last 168 hours. Coagulation Profile: No results for input(s): "INR", "PROTIME" in the last 168 hours. Cardiac Enzymes: No results for input(s): "CKTOTAL", "CKMB", "CKMBINDEX", "TROPONINI" in the last 168 hours. BNP (last 3 results) No results for input(s): "PROBNP" in the last 8760 hours. HbA1C: No results for input(s): "HGBA1C" in the last 72 hours. CBG: No results for input(s): "GLUCAP" in the last 168 hours. Lipid Profile: No results for input(s): "CHOL", "HDL", "LDLCALC", "TRIG", "CHOLHDL", "LDLDIRECT" in the last 72 hours. Thyroid Function Tests: No results for input(s): "TSH", "T4TOTAL", "FREET4", "T3FREE", "THYROIDAB" in the last 72 hours. Anemia Panel: No results for input(s): "VITAMINB12", "FOLATE", "FERRITIN", "TIBC", "IRON", "RETICCTPCT" in the last 72 hours. Urine analysis:    Component Value Date/Time   COLORURINE AMBER (A) 06/21/2022 1007   APPEARANCEUR CLOUDY (A) 06/21/2022 1007   APPEARANCEUR Clear 05/16/2021 1546   LABSPEC 1.025 06/21/2022 1007   PHURINE 5.0 06/21/2022 1007   GLUCOSEU NEGATIVE 06/21/2022 1007   HGBUR SMALL (A) 06/21/2022  1007   BILIRUBINUR MODERATE (A) 06/21/2022 1007   BILIRUBINUR Negative 05/16/2021 1546   KETONESUR 5 (A) 06/21/2022 1007   PROTEINUR 100 (A) 06/21/2022 1007   NITRITE NEGATIVE 06/21/2022 1007   LEUKOCYTESUR NEGATIVE 06/21/2022 1007    Radiological Exams on Admission: CT ABDOMEN PELVIS W CONTRAST  Result Date: 06/21/2022 CLINICAL DATA:  Left upper quadrant pain EXAM: CT ANGIOGRAPHY CHEST CT ABDOMEN AND PELVIS WITH CONTRAST  TECHNIQUE: Multidetector CT imaging of the chest was performed using the standard protocol during bolus administration of intravenous contrast. Multiplanar CT image reconstructions and MIPs were obtained to evaluate the vascular anatomy. Multidetector CT imaging of the abdomen and pelvis was performed using the standard protocol during bolus administration of intravenous contrast. RADIATION DOSE REDUCTION: This exam was performed according to the departmental dose-optimization program which includes automated exposure control, adjustment of the mA and/or kV according to patient size and/or use of iterative reconstruction technique. CONTRAST:  125m OMNIPAQUE IOHEXOL 350 MG/ML SOLN COMPARISON:  02/08/2007 FINDINGS: CTA CHEST FINDINGS Cardiovascular: SVC patent. Heart size normal. No pericardial effusion. Satisfactory opacification of pulmonary arteries noted, and there is no evidence of pulmonary emboli. Adequate contrast opacification of the thoracic aorta with no evidence of dissection, aneurysm, or stenosis. There is classic 3-vessel brachiocephalic arch anatomy without proximal stenosis. Mediastinum/Nodes: No mass or adenopathy. Lungs/Pleura: No pleural effusion. No pneumothorax. Patchy subsegmental atelectasis at the left right lung base. Lungs otherwise clear. Musculoskeletal: Multiple healing anterior right rib fractures. No acute findings. Review of the MIP images confirms the above findings. CT ABDOMEN and PELVIS FINDINGS Hepatobiliary: Hepatomegaly with diffuse fatty  infiltration. Cholecystectomy clips. Pancreas: Unremarkable. No pancreatic ductal dilatation or surrounding inflammatory changes. Spleen: Normal in size without focal abnormality. Adrenals/Urinary Tract: Adrenal glands are unremarkable. Kidneys are normal, without renal calculi, focal lesion, or hydronephrosis. Bladder is unremarkable. Stomach/Bowel: Stomach is decompressed, unremarkable. There is mild small bowel distension with some circumferential wall thickening and mucosal enhancement suggested in the right lower quadrant, without dilatation. Normal appendix. The colon is largely decompressed, unremarkable. Vascular/Lymphatic: Abdominal aorta normal. Portal vein patent. No abdominal or pelvic adenopathy. Reproductive: IUD in place.  No adnexal mass. Other: Bilateral pelvic phleboliths.  No ascites.  No free air. Musculoskeletal: Small paraumbilical hernia containing only mesenteric fat. No acute findings. Review of the MIP images confirms the above findings. IMPRESSION: 1. Negative for acute PE or thoracic aortic dissection. 2. Wall thickening and mucosal enhancement in the right lower quadrant small bowel suggesting enteritis. 3. Hepatomegaly with diffuse fatty infiltration. 4. Small paraumbilical hernia containing only mesenteric fat. 5. Healing anterior right rib fractures. Electronically Signed   By: DLucrezia EuropeM.D.   On: 06/21/2022 16:34   CT Angio Chest PE W and/or Wo Contrast  Result Date: 06/21/2022 CLINICAL DATA:  Left upper quadrant pain EXAM: CT ANGIOGRAPHY CHEST CT ABDOMEN AND PELVIS WITH CONTRAST TECHNIQUE: Multidetector CT imaging of the chest was performed using the standard protocol during bolus administration of intravenous contrast. Multiplanar CT image reconstructions and MIPs were obtained to evaluate the vascular anatomy. Multidetector CT imaging of the abdomen and pelvis was performed using the standard protocol during bolus administration of intravenous contrast. RADIATION DOSE  REDUCTION: This exam was performed according to the departmental dose-optimization program which includes automated exposure control, adjustment of the mA and/or kV according to patient size and/or use of iterative reconstruction technique. CONTRAST:  1052mOMNIPAQUE IOHEXOL 350 MG/ML SOLN COMPARISON:  02/08/2007 FINDINGS: CTA CHEST FINDINGS Cardiovascular: SVC patent. Heart size normal. No pericardial effusion. Satisfactory opacification of pulmonary arteries noted, and there is no evidence of pulmonary emboli. Adequate contrast opacification of the thoracic aorta with no evidence of dissection, aneurysm, or stenosis. There is classic 3-vessel brachiocephalic arch anatomy without proximal stenosis. Mediastinum/Nodes: No mass or adenopathy. Lungs/Pleura: No pleural effusion. No pneumothorax. Patchy subsegmental atelectasis at the left right lung base. Lungs otherwise clear. Musculoskeletal: Multiple healing anterior right rib fractures. No acute findings.  Review of the MIP images confirms the above findings. CT ABDOMEN and PELVIS FINDINGS Hepatobiliary: Hepatomegaly with diffuse fatty infiltration. Cholecystectomy clips. Pancreas: Unremarkable. No pancreatic ductal dilatation or surrounding inflammatory changes. Spleen: Normal in size without focal abnormality. Adrenals/Urinary Tract: Adrenal glands are unremarkable. Kidneys are normal, without renal calculi, focal lesion, or hydronephrosis. Bladder is unremarkable. Stomach/Bowel: Stomach is decompressed, unremarkable. There is mild small bowel distension with some circumferential wall thickening and mucosal enhancement suggested in the right lower quadrant, without dilatation. Normal appendix. The colon is largely decompressed, unremarkable. Vascular/Lymphatic: Abdominal aorta normal. Portal vein patent. No abdominal or pelvic adenopathy. Reproductive: IUD in place.  No adnexal mass. Other: Bilateral pelvic phleboliths.  No ascites.  No free air. Musculoskeletal:  Small paraumbilical hernia containing only mesenteric fat. No acute findings. Review of the MIP images confirms the above findings. IMPRESSION: 1. Negative for acute PE or thoracic aortic dissection. 2. Wall thickening and mucosal enhancement in the right lower quadrant small bowel suggesting enteritis. 3. Hepatomegaly with diffuse fatty infiltration. 4. Small paraumbilical hernia containing only mesenteric fat. 5. Healing anterior right rib fractures. Electronically Signed   By: Lucrezia Europe M.D.   On: 06/21/2022 16:34   CT HEAD WO CONTRAST (5MM)  Result Date: 06/21/2022 CLINICAL DATA:  Neuro deficit. EXAM: CT HEAD WITHOUT CONTRAST TECHNIQUE: Contiguous axial images were obtained from the base of the skull through the vertex without intravenous contrast. RADIATION DOSE REDUCTION: This exam was performed according to the departmental dose-optimization program which includes automated exposure control, adjustment of the mA and/or kV according to patient size and/or use of iterative reconstruction technique. COMPARISON:  Head CT 02/08/2007 FINDINGS: Brain: No evidence of acute infarction, hemorrhage, hydrocephalus, extra-axial collection or mass lesion/mass effect. There is a small rounded hypodensity in the right basal ganglia which is unchanged. There is a new small area of encephalomalacia in the lateral aspect of the left cerebellum. Vascular: No hyperdense vessel or unexpected calcification. Skull: Normal. Negative for fracture or focal lesion. Sinuses/Orbits: No acute finding. Other: None. IMPRESSION: 1. No acute intracranial process. 2. 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 3. Unchanged small rounded hypodensity in the right basal ganglia, old lacunar infarct versus dilated perivascular space. Electronically Signed   By: Ronney Asters M.D.   On: 06/21/2022 16:29   DG Abd Portable 1 View  Result Date: 06/21/2022 CLINICAL DATA:   Check IUD placement EXAM: PORTABLE ABDOMEN - 1 VIEW COMPARISON:  None Available. FINDINGS: Nonobstructive bowel gas pattern. There are pelvic phleboliths. An IUD projects over slightly left of the midline in the pelvis. No acute osseous abnormality. IMPRESSION: An IUD projects over slightly left of the midline in the pelvis. Electronically Signed   By: Marin Roberts M.D.   On: 06/21/2022 12:27   DG Chest 2 View  Result Date: 06/21/2022 CLINICAL DATA:  Shortness of breath EXAM: CHEST - 2 VIEW COMPARISON:  CXR 05/02/22 FINDINGS: No pleural effusion. No pneumothorax. Low lung volumes. No focal airspace opacity. No radiographically apparent displaced rib fractures. Visualized upper abdomen is unremarkable. Vertebral body heights are maintained. Surgical clips in the right quadrant. Asymmetric elevation of the right hemidiaphragm, unchanged IMPRESSION: No focal airspace opacity. Electronically Signed   By: Marin Roberts M.D.   On: 06/21/2022 12:26    EKG: Independently reviewed.  Sinus tachycardia, premature ventricular complexes.  Assessment/Plan Principal Problem:   Hypokalemia due to excessive gastrointestinal loss of potassium Active Problems:  Anxiety   Chronic hepatitis C virus infection (HCC)   Depression, recurrent (HCC)   Gastroesophageal reflux disease   Morbid obesity ([place])   Hepatic steatosis  (please populate well all problems here in Problem List. (For example, if patient is on BP meds at home and you resume or decide to hold them, it is a   Nausea, Vomiting, Left upper quadrant abdominal pain: Likely secondary to enteritis: Patient presented with nausea, vomiting and left upper quadrant abdominal pain for 4 days. CT chest /abdomen showed findings consistent with enteritis. Could be secondary to EtOH use. Continue IV fluid resuscitation, monitor electrolytes. Continue adequate pain control with hydrocodone as needed. Continue IV Zofran as needed.  Severe hypokalemia: Likely  secondary to ongoing nausea and vomiting leading to dehydration. Replaced.  Continue to monitor.  Hypomagnesemia/hyponatremia: Replaced.  Continue to monitor.  Elevated liver enzymes: Could be secondary to EtOH use. Monitor LFTs.  Elevated troponin: This could be secondary to excessive EtOH use. Patient denies any chest pain, EKG sinus tachycardia no ST-T wave changes. Continue to trend troponins  Acute hypoxic respiratory failure could be due to undiagnosed COPD Patient does not use oxygen at baseline.  Presented with hypoxia requiring 2 L of supplemental oxygen. CT a chest ruled out pulmonary embolism. She might be having undiagnosed COPD given chronic tobacco use. Continue supplemental oxygen and wean as tolerated.  EtOH abuse: Patient continues to drink alcohol despite having abdominal pain. Continue CIWA protocol and monitor electrolytes.  Anxiety/depression: Will hold anxiety and depressive medication due to QT prolongation.  History of substance abuse Urine drug screen positive for cannabinoids and benzos. Social services support.:   DVT prophylaxis: Lovenox Code Status: Full code Family Communication: No family at bed side. Disposition Plan:     Status is: Inpatient Remains inpatient appropriate because: Admitted with nausea, vomiting, left upper quadrant abdominal pain consistent with enteritis leading to electrolyte imbalance.  She is requiring IV fluid resuscitation.   Consults called: None Admission status: Inpatient   Duard Brady MD [facility]   If 7PM-7AM, please contact night-coverage [email or web address]   06/21/2022, 5:43 PM

## 2022-06-21 NOTE — ED Triage Notes (Addendum)
C/O left upper quadrant abdominal pain x 4 days.  Also N/V/D.  STates unable to tolerate anything PO.  AAOx3.  Skin warm and dry. NAD  Patient states she has been drinking a lot lately.  Nothing x 2 days.  Had hospitalization in December due to drinking, electrolytes were low at that time.

## 2022-06-21 NOTE — ED Provider Notes (Addendum)
[facility] [place] Provider Note    Event Date/Time   First MD Initiated Contact with Patient 06/21/22 0901     (approximate)   History   Abdominal Pain   HPI  Katie Stanley is a 37 y.o. female with hepatitis C, GERD, substance abuse who comes in with left upper abdomen pain.  She is also had a prior cholecystectomy in 2011.  Patient reports that she was previously admitted at [facility] in December with Electra abnormalities.  She reports after discharge she went back to drinking.  She reports drinking liquor.  She states that she last drank 2 days ago.  She reports that she has had multiple days of very watery diarrhea as well as episodes of nausea, vomiting.  She reports some epigastric abdominal pain associated with it.  She does report having an IUD in place.  She presents today with her fianc.  Denies any lower abdominal pain.   I reviewed the notes were patient had upper endoscopy and had some gastritis without bleeding noted in June 2023.   Physical Exam   Triage Vital Signs: ED Triage Vitals [06/21/22 0858]  Enc Vitals Group     BP 123/80     Pulse Rate (!) 108     Resp 18     Temp 98.4 F (36.9 C)     Temp Source Oral     SpO2 95 %     Weight 215 lb 9.8 oz (97.8 kg)     Height '5\' 3"'$  (1.6 m)     Head Circumference      Peak Flow      Pain Score 8     Pain Loc      Pain Edu?      Excl. in [place]?     Most recent vital signs: Vitals:   06/21/22 0858  BP: 123/80  Pulse: (!) 108  Resp: 18  Temp: 98.4 F (36.9 C)  SpO2: 95%     General: Awake, no distress.  CV:  Good peripheral perfusion.  Tachycardic Resp:  Normal effort.  Abd:  No distention.  Slightly tender in the upper abdomen without any rebound or guarding.  No tenderness in the lower abdomen. Other:     ED Results / Procedures / Treatments   Labs (all labs ordered are listed, but only abnormal results are displayed) Labs Reviewed  RESP PANEL BY RT-PCR (RSV, FLU A&B, COVID)   [id number]  GASTROINTESTINAL PANEL BY PCR, STOOL (REPLACES STOOL CULTURE)  C DIFFICILE QUICK SCREEN W PCR REFLEX    CBC WITH DIFFERENTIAL/PLATELET  COMPREHENSIVE METABOLIC PANEL  LIPASE, BLOOD  ETHANOL  URINALYSIS, ROUTINE W REFLEX MICROSCOPIC  URINE DRUG SCREEN, QUALITATIVE ([facility] ONLY)  HCG, QUANTITATIVE, PREGNANCY  POC URINE PREG, ED  TROPONIN I (HIGH SENSITIVITY)     EKG  My interpretation of EKG:  Sinus rate 96 that any ST elevation, some T wave inversions in the lateral leads V4 through V6 with prolonged QTc of 511  RADIOLOGY I have reviewed the xray personally and interpretted and no PNA  PROCEDURES:  Critical Care performed: yes  .1-3 Lead EKG Interpretation  Performed by: Vanessa [place], MD Authorized by: Vanessa [place], MD     Interpretation: abnormal     ECG rate:  103   ECG rate assessment: tachycardic     Rhythm: sinus tachycardia     Ectopy: none     Conduction: normal   .Critical Care  Performed by: Marjean Donna  E, MD Authorized by: Vanessa [place], MD   Critical care provider statement:    Critical care time (minutes):  30   Critical care was necessary to treat or prevent imminent or life-threatening deterioration of the following conditions:  Respiratory failure   Critical care was time spent personally by me on the following activities:  Development of treatment plan with patient or surrogate, discussions with consultants, evaluation of patient's response to treatment, examination of patient, ordering and review of laboratory studies, ordering and review of radiographic studies, ordering and performing treatments and interventions, pulse oximetry, re-evaluation of patient's condition and review of old charts    MEDICATIONS ORDERED IN ED: Medications  sodium chloride 0.9 % bolus 1,000 mL (0 mLs Intravenous Stopped 06/21/22 1240)  LORazepam (ATIVAN) injection 0.5 mg (0.5 mg Intravenous Given 06/21/22 1059)  pantoprazole (PROTONIX) injection 40 mg (40 mg  Intravenous Given 06/21/22 1100)  HYDROmorphone (DILAUDID) injection 0.5 mg (0.5 mg Intravenous Given 06/21/22 1314)     IMPRESSION / MDM / [place] / ED COURSE  I reviewed the triage vital signs and the nursing notes.   Patient's presentation is most consistent with acute presentation with potential threat to life or bodily function.   Patient comes in with nausea vomiting diarrhea abdominal pain in the setting of significant EtOH use.  Patient tachycardic's with prolonged QTc.  No obvious tremor to suggest severe withdrawal but will give 0.5 of Ativan given unable to give other prolonged QTc medications for nausea.  Labs ordered to evaluate for electrolyte abnormalities, AKI, x-rays ordered, COVID, flu swab.  Patient be given 1 L of fluid. Denies any new vaginal discharge, new partners, or lower abdominal pain to suggest PID. Does have IUD.   1:19 PM patient reportedly got hypoxic to 86-87% and had to be placed on 2 L.  Pregnancy test was negative.  White count elevated at 13.  COVID flu were negative.  Unfortunately patient's had issues with getting her blood work lab is going to send down someone to try to recollect that she has had delay in care secondary to this blood work.  Have added on CT imaging of her abdomen, CT PE given hypoxia normal chest x-ray negative COVID as well as a CT head given history of EtOH abuse just to ensure no evidence of intracranial hemorrhage.  Patient will be handed off to oncoming team pending these results  Pt given some IV dilaudid for the pain.   The patient is on the cardiac monitor to evaluate for evidence of arrhythmia and/or significant heart rate changes.      FINAL CLINICAL IMPRESSION(S) / ED DIAGNOSES   Final diagnoses:  Abdominal pain, unspecified abdominal location  Acute respiratory failure with hypoxia (HCC)  QT prolongation  ETOH abuse     Rx / DC Orders   ED Discharge Orders     None        Note:  This document  was prepared using Dragon voice recognition software and may include unintentional dictation errors.   Vanessa [place], MD 06/21/22 1551    Vanessa , MD 06/21/22 [phone]

## 2022-06-21 NOTE — ED Notes (Signed)
Patient transported to X-ray 

## 2022-06-21 NOTE — ED Provider Notes (Signed)
Patient's blood work returned with potassium <2. Did add on magnesium level which was also low. Given concern for significant electrolyte abnormality did start IV and PO K repletion and IV magnesium. Did discuss finding with patient. Will necessitate admission. Discussed with Dr. Shelly Coss with the hospitalist service who will plan on admission.  CRITICAL CARE Performed by: Nance Pear   Total critical care time: 20 minutes  Critical care time was exclusive of separately billable procedures and treating other patients.  Critical care was necessary to treat or prevent imminent or life-threatening deterioration.  Critical care was time spent personally by me on the following activities: development of treatment plan with patient and/or surrogate as well as nursing, discussions with consultants, evaluation of patient's response to treatment, examination of patient, obtaining history from patient or surrogate, ordering and performing treatments and interventions, ordering and review of laboratory studies, ordering and review of radiographic studies, pulse oximetry and re-evaluation of patient's condition.     Nance Pear, MD 06/21/22 [phone]

## 2022-06-21 NOTE — ED Notes (Signed)
Pt returned from CT °

## 2022-06-22 LAB — COMPREHENSIVE METABOLIC PANEL
ALT: 71 U/L — ABNORMAL HIGH (ref 0–44)
AST: 174 U/L — ABNORMAL HIGH (ref 15–41)
Albumin: 2.9 g/dL — ABNORMAL LOW (ref 3.5–5.0)
Alkaline Phosphatase: 230 U/L — ABNORMAL HIGH (ref 38–126)
Anion gap: 13 (ref 5–15)
BUN: <5 mg/dL — ABNORMAL LOW (ref 6–20)
CO2: 29 mmol/L (ref 22–32)
Calcium: 7.2 mg/dL — ABNORMAL LOW (ref 8.9–10.3)
Chloride: 90 mmol/L — ABNORMAL LOW (ref 98–111)
Creatinine, Ser: 0.8 mg/dL (ref 0.44–1.00)
GFR, Estimated: >60 mL/min (ref >60)
Glucose, Bld: 109 mg/dL — ABNORMAL HIGH (ref 70–99)
Potassium: <2 mmol/L — CL (ref 3.5–5.1)
Sodium: 132 mmol/L — ABNORMAL LOW (ref 135–145)
Total Bilirubin: 4.6 mg/dL — ABNORMAL HIGH (ref 0.3–1.2)
Total Protein: 6.5 g/dL (ref 6.5–8.1)

## 2022-06-22 LAB — CBC
HCT: 39 % (ref 36.0–46.0)
Hemoglobin: 13.8 g/dL (ref 12.0–15.0)
MCH: 34.2 pg — ABNORMAL HIGH (ref 26.0–34.0)
MCHC: 35.4 g/dL (ref 30.0–36.0)
MCV: 96.8 fL (ref 80.0–100.0)
Platelets: 272 K/uL (ref 150–400)
RBC: 4.03 MIL/uL (ref 3.87–5.11)
RDW: 14.5 % (ref 11.5–15.5)
WBC: 10 K/uL (ref 4.0–10.5)
nRBC: 0 % (ref 0.0–0.2)

## 2022-06-22 LAB — PHOSPHORUS: Phosphorus: 1.3 mg/dL — ABNORMAL LOW (ref 2.5–4.6)

## 2022-06-22 LAB — POTASSIUM: Potassium: 2.3 mmol/L — CL (ref 3.5–5.1)

## 2022-06-22 LAB — TROPONIN I (HIGH SENSITIVITY): Troponin I (High Sensitivity): 19 ng/L — ABNORMAL HIGH (ref <18)

## 2022-06-22 LAB — HIV ANTIBODY (ROUTINE TESTING W REFLEX): HIV Screen 4th Generation wRfx: NONREACTIVE

## 2022-06-22 LAB — MAGNESIUM: Magnesium: 1.8 mg/dL (ref 1.7–2.4)

## 2022-06-22 MED ORDER — LOPERAMIDE HCL 2 MG PO CAPS
2.0000 mg | ORAL_CAPSULE | ORAL | Status: AC | PRN
  Administered 2022-06-22 (×2): 2 mg via ORAL
  Filled 2022-06-22 (×2): qty 1

## 2022-06-22 MED ORDER — ENOXAPARIN SODIUM 60 MG/0.6ML IJ SOSY: 0.5000 mg/kg | PREFILLED_SYRINGE | INTRAMUSCULAR | Status: DC

## 2022-06-22 MED ORDER — POTASSIUM PHOSPHATES 15 MMOLE/5ML IV SOLN
30.0000 mmol | Freq: Once | INTRAVENOUS | Status: AC
  Administered 2022-06-22: 30 mmol via INTRAVENOUS
  Filled 2022-06-22: qty 10

## 2022-06-22 MED ORDER — POTASSIUM CHLORIDE 10 MEQ/100ML IV SOLN
10.0000 meq | INTRAVENOUS | Status: AC
  Administered 2022-06-22 – 2022-06-23 (×4): 10 meq via INTRAVENOUS
  Filled 2022-06-22 (×4): qty 100

## 2022-06-22 MED ORDER — POTASSIUM CHLORIDE 10 MEQ/100ML IV SOLN
10.0000 meq | INTRAVENOUS | Status: AC
  Administered 2022-06-22 (×4): 10 meq via INTRAVENOUS
  Filled 2022-06-22 (×4): qty 100

## 2022-06-22 MED ORDER — POTASSIUM CHLORIDE 20 MEQ PO PACK
40.0000 meq | PACK | Freq: Once | ORAL | Status: AC
  Administered 2022-06-22: 40 meq via ORAL
  Filled 2022-06-22: qty 2

## 2022-06-22 NOTE — Progress Notes (Signed)
PROGRESS NOTE    Katie Stanley  [id number] DOB: [birth year] DOA: 06/21/2022 PCP: Valerie Roys, DO   Brief Narrative:  This 37 years old female with PMH significant for EtOH abuse, tobacco use, depression/anxiety, substance abuse presented in the ED with complaints of nausea vomiting and left upper quadrant abdominal pain for last 4 days.  She describes abdominal pain is constant radiating towards the back associated with nausea and vomiting.  She continues to drink alcohol despite having abdominal pain.  Workup in the ED reveals sodium 131, potassium 2.0, chloride 79, bicarb 24, creatinine 1.12, calcium 7.5, anion gap 18 magnesium 1.2 troponin 25, CT abdomen and pelvis shows wall thickening and mucosal enhancement consistent with enteritis.  Patient was admitted for further evaluation.  Assessment & Plan:   Principal Problem:   Hypokalemia due to excessive gastrointestinal loss of potassium Active Problems:   Anxiety   Chronic hepatitis C virus infection (HCC)   Depression, recurrent (HCC)   Gastroesophageal reflux disease   Morbid obesity (HCC)   Hepatic steatosis  Nausea, Vomiting, LUQ pain: Likely secondary to enteritis: Patient presented with nausea, vomiting and left upper quadrant abdominal pain for 4 days. CT chest /abdomen showed findings consistent with enteritis. Could be secondary to EtOH use. Continue IV fluid resuscitation, monitor electrolytes. Continue adequate pain control with hydrocodone as needed. Continue IV Zofran as needed.   Severe hypokalemia: Likely secondary to ongoing nausea and vomiting leading to dehydration. Replaced.  Continue to monitor.   Hypomagnesemia/hyponatremia: Replaced.  Continue to monitor.   Elevated liver enzymes: Could be secondary to EtOH use. Monitor LFTs.   Elevated troponin: This could be secondary to excessive EtOH use. Patient denies any chest pain, EKG sinus tachycardia no ST-T wave changes. Continue to trend  troponins 25, 24   Acute hypoxic respiratory failure could be due to undiagnosed COPD Patient does not use oxygen at baseline.   She presented with hypoxia requiring 2 L of supplemental oxygen. CT chest ruled out pulmonary embolism. She might be having undiagnosed COPD given chronic tobacco use. Continue supplemental oxygen and wean as tolerated.   EtOH abuse: Patient continues to drink alcohol despite having abdominal pain. Continue CIWA protocol and monitor electrolytes.   Anxiety/depression: Will hold anxiety and depressive medication due to QT prolongation.   History of substance abuse Urine drug screen positive for cannabinoids and benzos. Social services support.:   DVT prophylaxis: Lovenox Code Status: Code Family Communication: No family At bedside Disposition Plan:   Status is: Inpatient Remains inpatient appropriate because:   Admitted with nausea, vomiting, left upper quadrant abdominal pain consistent with enteritis leading to electrolyte imbalance.  She is requiring IV fluid resuscitation.    Consultants:  None  Procedures:None  Antimicrobials: None  Subjective: Was seen and examined at bedside.  Overnight events noted.   Patient reports doing slightly better still has low potassium magnesium and calcium. She still reports having abdominal pain.  Objective: Vitals:   06/22/22 0030 06/22/22 0354 06/22/22 0801 06/22/22 1148  BP: 122/79 115/69 126/73 (!) 125/90  Pulse: 92 87 84 86  Resp: '18 20 16 16  '$ Temp: 98.5 F (36.9 C) 98.3 F (36.8 C) 98.9 F (37.2 C) 98.1 F (36.7 C)  TempSrc: Oral Oral    SpO2: 96% 96% 93% 100%  Weight:  92.3 kg    Height:        Intake/Output Summary (Last 24 hours) at 06/22/2022 1346 Last data filed at 06/22/2022 1057 Gross per 24 hour  Intake 954.85 ml  Output --  Net 954.85 ml   Filed Weights   06/21/22 0858 06/22/22 0354  Weight: 97.8 kg 92.3 kg    Examination:  General exam: Appears calm and comfortable,  not in any acute distress. Respiratory system: Clear to auscultation. Respiratory effort normal.  RR 15 Cardiovascular system: S1 & S2 heard, regular rate and rhythm, no murmur. Gastrointestinal system: Abdomen is soft, mildly tender, non distended, BS+ Central nervous system: Alert and oriented x 3. No focal neurological deficits. Extremities: No edema, no cyanosis, no clubbing. Skin: No rashes, lesions or ulcers Psychiatry: Judgement and insight appear normal. Mood & affect appropriate.     Data Reviewed: I have personally reviewed following labs and imaging studies  CBC: Recent Labs  Lab 06/21/22 1007 06/22/22 0743  WBC 13.6* 10.0  NEUTROABS 10.4*  --   HGB 14.2 13.8  HCT 40.1 39.0  MCV 95.9 96.8  PLT 283 [id number]   Basic Metabolic Panel: Recent Labs  Lab 06/21/22 1226 06/21/22 1451 06/22/22 0743  NA  --  131* 132*  K  --  <2.0* <2.0*  CL  --  79* 90*  CO2  --  34* 29  GLUCOSE  --  98 109*  BUN  --  <5* <5*  CREATININE  --  1.12* 0.80  CALCIUM  --  7.5* 7.2*  MG 1.2*  --  1.8  PHOS  --   --  1.3*   GFR: Estimated Creatinine Clearance: 105 mL/min (by C-G formula based on SCr of 0.8 mg/dL). Liver Function Tests: Recent Labs  Lab 06/21/22 1451 06/22/22 0743  AST 165* 174*  ALT 71* 71*  ALKPHOS 231* 230*  BILITOT 4.7* 4.6*  PROT 6.4* 6.5  ALBUMIN 2.9* 2.9*   Recent Labs  Lab 06/21/22 1451  LIPASE 35   No results for input(s): "AMMONIA" in the last 168 hours. Coagulation Profile: No results for input(s): "INR", "PROTIME" in the last 168 hours. Cardiac Enzymes: No results for input(s): "CKTOTAL", "CKMB", "CKMBINDEX", "TROPONINI" in the last 168 hours. BNP (last 3 results) No results for input(s): "PROBNP" in the last 8760 hours. HbA1C: No results for input(s): "HGBA1C" in the last 72 hours. CBG: No results for input(s): "GLUCAP" in the last 168 hours. Lipid Profile: No results for input(s): "CHOL", "HDL", "LDLCALC", "TRIG", "CHOLHDL", "LDLDIRECT" in  the last 72 hours. Thyroid Function Tests: No results for input(s): "TSH", "T4TOTAL", "FREET4", "T3FREE", "THYROIDAB" in the last 72 hours. Anemia Panel: No results for input(s): "VITAMINB12", "FOLATE", "FERRITIN", "TIBC", "IRON", "RETICCTPCT" in the last 72 hours. Sepsis Labs: No results for input(s): "PROCALCITON", "LATICACIDVEN" in the last 168 hours.  Recent Results (from the past 240 hour(s))  Resp panel by RT-PCR (RSV, Flu A&B, Covid) Anterior Nasal Swab     Status: None   Collection Time: 06/21/22 10:07 AM   Specimen: Anterior Nasal Swab  Result Value Ref Range Status   SARS Coronavirus 2 by RT PCR NEGATIVE NEGATIVE Final    Comment: (NOTE) SARS-CoV-2 target nucleic acids are NOT DETECTED.  The SARS-CoV-2 RNA is generally detectable in upper respiratory specimens during the acute phase of infection. The lowest concentration of SARS-CoV-2 viral copies this assay can detect is 138 copies/mL. A negative result does not preclude SARS-Cov-2 infection and should not be used as the sole basis for treatment or other patient management decisions. A negative result may occur with  improper specimen collection/handling, submission of specimen other than nasopharyngeal swab, presence of viral mutation(s) within the areas  targeted by this assay, and inadequate number of viral copies(<138 copies/mL). A negative result must be combined with clinical observations, patient history, and epidemiological information. The expected result is Negative.  Fact Sheet for Patients:  EntrepreneurPulse.com.au  Fact Sheet for Healthcare Providers:  IncredibleEmployment.be  This test is no t yet approved or cleared by the [place] FDA and  has been authorized for detection and/or diagnosis of SARS-CoV-2 by FDA under an Emergency Use Authorization (EUA). This EUA will remain  in effect (meaning this test can be used) for the duration of the COVID-19 declaration  under Section 564(b)(1) of the Act, 21 U.S.C.section 360bbb-3(b)(1), unless the authorization is terminated  or revoked sooner.       Influenza A by PCR NEGATIVE NEGATIVE Final   Influenza B by PCR NEGATIVE NEGATIVE Final    Comment: (NOTE) The Xpert Xpress SARS-CoV-2/FLU/RSV plus assay is intended as an aid in the diagnosis of influenza from Nasopharyngeal swab specimens and should not be used as a sole basis for treatment. Nasal washings and aspirates are unacceptable for Xpert Xpress SARS-CoV-2/FLU/RSV testing.  Fact Sheet for Patients: EntrepreneurPulse.com.au  Fact Sheet for Healthcare Providers: IncredibleEmployment.be  This test is not yet approved or cleared by the [place] FDA and has been authorized for detection and/or diagnosis of SARS-CoV-2 by FDA under an Emergency Use Authorization (EUA). This EUA will remain in effect (meaning this test can be used) for the duration of the COVID-19 declaration under Section 564(b)(1) of the Act, 21 U.S.C. section 360bbb-3(b)(1), unless the authorization is terminated or revoked.     Resp Syncytial Virus by PCR NEGATIVE NEGATIVE Final    Comment: (NOTE) Fact Sheet for Patients: EntrepreneurPulse.com.au  Fact Sheet for Healthcare Providers: IncredibleEmployment.be  This test is not yet approved or cleared by the [place] FDA and has been authorized for detection and/or diagnosis of SARS-CoV-2 by FDA under an Emergency Use Authorization (EUA). This EUA will remain in effect (meaning this test can be used) for the duration of the COVID-19 declaration under Section 564(b)(1) of the Act, 21 U.S.C. section 360bbb-3(b)(1), unless the authorization is terminated or revoked.  Performed at [facility], [address] [place], [place] [address]   Gastrointestinal Panel by PCR , Stool     Status: None   Collection Time: 06/21/22 10:07 AM    Specimen: Stool  Result Value Ref Range Status   Campylobacter species NOT DETECTED NOT DETECTED Final   Plesimonas shigelloides NOT DETECTED NOT DETECTED Final   Salmonella species NOT DETECTED NOT DETECTED Final   Yersinia enterocolitica NOT DETECTED NOT DETECTED Final   Vibrio species NOT DETECTED NOT DETECTED Final   Vibrio cholerae NOT DETECTED NOT DETECTED Final   Enteroaggregative E coli (EAEC) NOT DETECTED NOT DETECTED Final   Enteropathogenic E coli (EPEC) NOT DETECTED NOT DETECTED Final   Enterotoxigenic E coli (ETEC) NOT DETECTED NOT DETECTED Final   Shiga like toxin producing E coli (STEC) NOT DETECTED NOT DETECTED Final   Shigella/Enteroinvasive E coli (EIEC) NOT DETECTED NOT DETECTED Final   Cryptosporidium NOT DETECTED NOT DETECTED Final   Cyclospora cayetanensis NOT DETECTED NOT DETECTED Final   Entamoeba histolytica NOT DETECTED NOT DETECTED Final   Giardia lamblia NOT DETECTED NOT DETECTED Final   Adenovirus F40/41 NOT DETECTED NOT DETECTED Final   Astrovirus NOT DETECTED NOT DETECTED Final   Norovirus GI/GII NOT DETECTED NOT DETECTED Final   Rotavirus A NOT DETECTED NOT DETECTED Final   Sapovirus (I, II, IV, and V)  NOT DETECTED NOT DETECTED Final    Comment: Performed at [facility], [place], [place] [address]  C Difficile Quick Screen w PCR reflex     Status: None   Collection Time: 06/21/22 10:07 AM   Specimen: Stool  Result Value Ref Range Status   C Diff antigen NEGATIVE NEGATIVE Final   C Diff toxin NEGATIVE NEGATIVE Final   C Diff interpretation No C. difficile detected.  Final    Comment: Performed at [facility], [address] [place],  [address]   Radiology Studies: CT ABDOMEN PELVIS W CONTRAST  Result Date: 06/21/2022 CLINICAL DATA:  Left upper quadrant pain EXAM: CT ANGIOGRAPHY CHEST CT ABDOMEN AND PELVIS WITH CONTRAST TECHNIQUE: Multidetector CT imaging of the chest was performed using the standard  protocol during bolus administration of intravenous contrast. Multiplanar CT image reconstructions and MIPs were obtained to evaluate the vascular anatomy. Multidetector CT imaging of the abdomen and pelvis was performed using the standard protocol during bolus administration of intravenous contrast. RADIATION DOSE REDUCTION: This exam was performed according to the departmental dose-optimization program which includes automated exposure control, adjustment of the mA and/or kV according to patient size and/or use of iterative reconstruction technique. CONTRAST:  151m OMNIPAQUE IOHEXOL 350 MG/ML SOLN COMPARISON:  02/08/2007 FINDINGS: CTA CHEST FINDINGS Cardiovascular: SVC patent. Heart size normal. No pericardial effusion. Satisfactory opacification of pulmonary arteries noted, and there is no evidence of pulmonary emboli. Adequate contrast opacification of the thoracic aorta with no evidence of dissection, aneurysm, or stenosis. There is classic 3-vessel brachiocephalic arch anatomy without proximal stenosis. Mediastinum/Nodes: No mass or adenopathy. Lungs/Pleura: No pleural effusion. No pneumothorax. Patchy subsegmental atelectasis at the left right lung base. Lungs otherwise clear. Musculoskeletal: Multiple healing anterior right rib fractures. No acute findings. Review of the MIP images confirms the above findings. CT ABDOMEN and PELVIS FINDINGS Hepatobiliary: Hepatomegaly with diffuse fatty infiltration. Cholecystectomy clips. Pancreas: Unremarkable. No pancreatic ductal dilatation or surrounding inflammatory changes. Spleen: Normal in size without focal abnormality. Adrenals/Urinary Tract: Adrenal glands are unremarkable. Kidneys are normal, without renal calculi, focal lesion, or hydronephrosis. Bladder is unremarkable. Stomach/Bowel: Stomach is decompressed, unremarkable. There is mild small bowel distension with some circumferential wall thickening and mucosal enhancement suggested in the right lower  quadrant, without dilatation. Normal appendix. The colon is largely decompressed, unremarkable. Vascular/Lymphatic: Abdominal aorta normal. Portal vein patent. No abdominal or pelvic adenopathy. Reproductive: IUD in place.  No adnexal mass. Other: Bilateral pelvic phleboliths.  No ascites.  No free air. Musculoskeletal: Small paraumbilical hernia containing only mesenteric fat. No acute findings. Review of the MIP images confirms the above findings. IMPRESSION: 1. Negative for acute PE or thoracic aortic dissection. 2. Wall thickening and mucosal enhancement in the right lower quadrant small bowel suggesting enteritis. 3. Hepatomegaly with diffuse fatty infiltration. 4. Small paraumbilical hernia containing only mesenteric fat. 5. Healing anterior right rib fractures. Electronically Signed   By: DLucrezia EuropeM.D.   On: 06/21/2022 16:34   CT Angio Chest PE W and/or Wo Contrast  Result Date: 06/21/2022 CLINICAL DATA:  Left upper quadrant pain EXAM: CT ANGIOGRAPHY CHEST CT ABDOMEN AND PELVIS WITH CONTRAST TECHNIQUE: Multidetector CT imaging of the chest was performed using the standard protocol during bolus administration of intravenous contrast. Multiplanar CT image reconstructions and MIPs were obtained to evaluate the vascular anatomy. Multidetector CT imaging of the abdomen and pelvis was performed using the standard protocol during bolus administration of intravenous contrast. RADIATION DOSE REDUCTION: This exam  was performed according to the departmental dose-optimization program which includes automated exposure control, adjustment of the mA and/or kV according to patient size and/or use of iterative reconstruction technique. CONTRAST:  110m OMNIPAQUE IOHEXOL 350 MG/ML SOLN COMPARISON:  02/08/2007 FINDINGS: CTA CHEST FINDINGS Cardiovascular: SVC patent. Heart size normal. No pericardial effusion. Satisfactory opacification of pulmonary arteries noted, and there is no evidence of pulmonary emboli. Adequate  contrast opacification of the thoracic aorta with no evidence of dissection, aneurysm, or stenosis. There is classic 3-vessel brachiocephalic arch anatomy without proximal stenosis. Mediastinum/Nodes: No mass or adenopathy. Lungs/Pleura: No pleural effusion. No pneumothorax. Patchy subsegmental atelectasis at the left right lung base. Lungs otherwise clear. Musculoskeletal: Multiple healing anterior right rib fractures. No acute findings. Review of the MIP images confirms the above findings. CT ABDOMEN and PELVIS FINDINGS Hepatobiliary: Hepatomegaly with diffuse fatty infiltration. Cholecystectomy clips. Pancreas: Unremarkable. No pancreatic ductal dilatation or surrounding inflammatory changes. Spleen: Normal in size without focal abnormality. Adrenals/Urinary Tract: Adrenal glands are unremarkable. Kidneys are normal, without renal calculi, focal lesion, or hydronephrosis. Bladder is unremarkable. Stomach/Bowel: Stomach is decompressed, unremarkable. There is mild small bowel distension with some circumferential wall thickening and mucosal enhancement suggested in the right lower quadrant, without dilatation. Normal appendix. The colon is largely decompressed, unremarkable. Vascular/Lymphatic: Abdominal aorta normal. Portal vein patent. No abdominal or pelvic adenopathy. Reproductive: IUD in place.  No adnexal mass. Other: Bilateral pelvic phleboliths.  No ascites.  No free air. Musculoskeletal: Small paraumbilical hernia containing only mesenteric fat. No acute findings. Review of the MIP images confirms the above findings. IMPRESSION: 1. Negative for acute PE or thoracic aortic dissection. 2. Wall thickening and mucosal enhancement in the right lower quadrant small bowel suggesting enteritis. 3. Hepatomegaly with diffuse fatty infiltration. 4. Small paraumbilical hernia containing only mesenteric fat. 5. Healing anterior right rib fractures. Electronically Signed   By: DLucrezia EuropeM.D.   On: 06/21/2022 16:34    CT HEAD WO CONTRAST (5MM)  Result Date: 06/21/2022 CLINICAL DATA:  Neuro deficit. EXAM: CT HEAD WITHOUT CONTRAST TECHNIQUE: Contiguous axial images were obtained from the base of the skull through the vertex without intravenous contrast. RADIATION DOSE REDUCTION: This exam was performed according to the departmental dose-optimization program which includes automated exposure control, adjustment of the mA and/or kV according to patient size and/or use of iterative reconstruction technique. COMPARISON:  Head CT 02/08/2007 FINDINGS: Brain: No evidence of acute infarction, hemorrhage, hydrocephalus, extra-axial collection or mass lesion/mass effect. There is a small rounded hypodensity in the right basal ganglia which is unchanged. There is a new small area of encephalomalacia in the lateral aspect of the left cerebellum. Vascular: No hyperdense vessel or unexpected calcification. Skull: Normal. Negative for fracture or focal lesion. Sinuses/Orbits: No acute finding. Other: None. IMPRESSION: 1. No acute intracranial process. 2. 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 3. Unchanged small rounded hypodensity in the right basal ganglia, old lacunar infarct versus dilated perivascular space. Electronically Signed   By: ARonney AstersM.D.   On: 06/21/2022 16:29   DG Abd Portable 1 View  Result Date: 06/21/2022 CLINICAL DATA:  Check IUD placement EXAM: PORTABLE ABDOMEN - 1 VIEW COMPARISON:  None Available. FINDINGS: Nonobstructive bowel gas pattern. There are pelvic phleboliths. An IUD projects over slightly left of the midline in the pelvis. No acute osseous abnormality. IMPRESSION: An IUD projects over slightly left of the midline in the pelvis. Electronically Signed   By:  Marin Roberts M.D.   On: 06/21/2022 12:27   DG Chest 2 View  Result Date: 06/21/2022 CLINICAL DATA:  Shortness of breath EXAM: CHEST - 2 VIEW COMPARISON:  CXR 05/02/22  FINDINGS: No pleural effusion. No pneumothorax. Low lung volumes. No focal airspace opacity. No radiographically apparent displaced rib fractures. Visualized upper abdomen is unremarkable. Vertebral body heights are maintained. Surgical clips in the right quadrant. Asymmetric elevation of the right hemidiaphragm, unchanged IMPRESSION: No focal airspace opacity. Electronically Signed   By: Marin Roberts M.D.   On: 06/21/2022 12:26    Scheduled Meds:  docusate sodium  100 mg Oral BID   enoxaparin (LOVENOX) injection  0.5 mg/kg Subcutaneous [id number]   folic acid  1 mg Oral Daily   multivitamin with minerals  1 tablet Oral Daily   thiamine  100 mg Oral Daily   Continuous Infusions:  0.9 % NaCl with KCl 20 mEq / L 100 mL/hr at 06/22/22 0852   potassium chloride 10 mEq (06/22/22 1343)   potassium PHOSPHATE IVPB (in mmol)       LOS: 1 day    Time spent: 35 mins    Duard Brady, MD [facility]   If 7PM-7AM, please contact night-coverage

## 2022-06-22 NOTE — TOC Initial Note (Signed)
Transition of Care [facility]) - Initial/Assessment Note    Patient Details  Name: Katie Stanley MRN: [id number] Date of Birth: 1985/05/28  Transition of Care [facility]) CM/SW Contact:    Candie Chroman, LCSW Phone Number: 06/22/2022, 3:04 PM  Clinical Narrative:  CSW met with patient. No supports at bedside. CSW introduced role and inquired about interest in SA resources. Patient stated she already has a list at home. No further concerns. CSW encouraged patient to contact CSW as needed. CSW will continue to follow patient for support and facilitate return home once stable. Husband will transport her home at discharge.                Expected Discharge Plan: Home/Self Care Barriers to Discharge: Continued Medical Work up   Patient Goals and CMS Choice            Expected Discharge Plan and Services     Post Acute Care Choice: NA Living arrangements for the past 2 months: Single Family Home                                      Prior Living Arrangements/Services Living arrangements for the past 2 months: Single Family Home   Patient language and need for interpreter reviewed:: Yes Do you feel safe going back to the place where you live?: Yes      Need for Family Participation in Patient Care: Yes (Comment) Care giver support system in place?: Yes (comment)   Criminal Activity/Legal Involvement Pertinent to Current Situation/Hospitalization: No - Comment as needed  Activities of Daily Living Home Assistive Devices/Equipment: None ADL Screening (condition at time of admission) Patient's cognitive ability adequate to safely complete daily activities?: Yes Is the patient deaf or have difficulty hearing?: No Does the patient have difficulty seeing, even when wearing glasses/contacts?: No Does the patient have difficulty concentrating, remembering, or making decisions?: No Patient able to express need for assistance with ADLs?: No Does the patient have difficulty dressing or  bathing?: No Independently performs ADLs?: Yes (appropriate for developmental age) Does the patient have difficulty walking or climbing stairs?: No Weakness of Legs: Both Weakness of Arms/Hands: None  Permission Sought/Granted                  Emotional Assessment Appearance:: Appears stated age Attitude/Demeanor/Rapport: Engaged, Gracious Affect (typically observed): Accepting, Appropriate, Calm, Pleasant Orientation: : Oriented to Self, Oriented to Place, Oriented to  Time, Oriented to Situation Alcohol / Substance Use: Alcohol Use Psych Involvement: No (comment)  Admission diagnosis:  ETOH abuse [F10.10] Acute respiratory failure with hypoxia (HCC) [J96.01] QT prolongation [R94.31] Abdominal pain, unspecified abdominal location [R10.9] Hypokalemia due to excessive gastrointestinal loss of potassium [E87.6] Patient Active Problem List   Diagnosis Date Noted   Hypokalemia due to excessive gastrointestinal loss of potassium 06/21/2022   Hepatic steatosis 04/28/2022   Vitamin D deficiency 04/28/2022   History of cardiac arrest 04/28/2022   Primary hypertension 05/16/2021   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 ([place]) 12/31/2020   Paroxysmal tachycardia ([place]) 07/22/2019   Morbid obesity ([place]) 04/29/2018   Acute gastritis without hemorrhage    Gastroesophageal reflux disease 12/25/2017   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[place]) 05/01/2016   Anxiety 02/25/2015   Depression, recurrent ([place]) 02/25/2015   Chronic hepatitis C virus infection ([place]) 07/07/2009   PCP:  Valerie Roys, DO Pharmacy:   [facility]  [place], [place] AT [place] [address] Phone: [phone] Fax: [phone]  [id number] - [address], [address] AT [address] [address] Phone: [phone] Fax: [phone]     Social Determinants of  Health (SDOH) Social History: SDOH Screenings   Food Insecurity: No Food Insecurity (06/21/2022)  Housing: Low Risk  (06/21/2022)  Transportation Needs: No Transportation Needs (06/21/2022)  Utilities: Not At Risk (06/21/2022)  Depression (PHQ2-9): Low Risk  (04/28/2022)  Recent Concern: Depression (PHQ2-9) - High Risk (03/14/2022)  Tobacco Use: High Risk (06/21/2022)   SDOH Interventions: Food Insecurity Interventions: Intervention Not Indicated Housing Interventions: Intervention Not Indicated Transportation Interventions: Intervention Not Indicated Utilities Interventions: Intervention Not Indicated   Readmission Risk Interventions     No data to display

## 2022-06-23 LAB — COMPREHENSIVE METABOLIC PANEL
ALT: 67 U/L — ABNORMAL HIGH (ref 0–44)
AST: 174 U/L — ABNORMAL HIGH (ref 15–41)
Albumin: 2.6 g/dL — ABNORMAL LOW (ref 3.5–5.0)
Alkaline Phosphatase: 207 U/L — ABNORMAL HIGH (ref 38–126)
Anion gap: 16 — ABNORMAL HIGH (ref 5–15)
BUN: <5 mg/dL — ABNORMAL LOW (ref 6–20)
CO2: 25 mmol/L (ref 22–32)
Calcium: 7.2 mg/dL — ABNORMAL LOW (ref 8.9–10.3)
Chloride: 95 mmol/L — ABNORMAL LOW (ref 98–111)
Creatinine, Ser: 0.64 mg/dL (ref 0.44–1.00)
GFR, Estimated: >60 mL/min (ref >60)
Glucose, Bld: 93 mg/dL (ref 70–99)
Potassium: 2.5 mmol/L — CL (ref 3.5–5.1)
Sodium: 136 mmol/L (ref 135–145)
Total Bilirubin: 3.6 mg/dL — ABNORMAL HIGH (ref 0.3–1.2)
Total Protein: 5.8 g/dL — ABNORMAL LOW (ref 6.5–8.1)

## 2022-06-23 LAB — CBC
HCT: 35.2 % — ABNORMAL LOW (ref 36.0–46.0)
Hemoglobin: 12.3 g/dL (ref 12.0–15.0)
MCH: 34.1 pg — ABNORMAL HIGH (ref 26.0–34.0)
MCHC: 34.9 g/dL (ref 30.0–36.0)
MCV: 97.5 fL (ref 80.0–100.0)
Platelets: 182 10*3/uL (ref 150–400)
RBC: 3.61 MIL/uL — ABNORMAL LOW (ref 3.87–5.11)
RDW: 14.7 % (ref 11.5–15.5)
WBC: 8.7 10*3/uL (ref 4.0–10.5)
nRBC: 0 % (ref 0.0–0.2)

## 2022-06-23 LAB — PHOSPHORUS: Phosphorus: 2.1 mg/dL — ABNORMAL LOW (ref 2.5–4.6)

## 2022-06-23 LAB — POTASSIUM: Potassium: 3.4 mmol/L — ABNORMAL LOW (ref 3.5–5.1)

## 2022-06-23 LAB — MAGNESIUM: Magnesium: 1.6 mg/dL — ABNORMAL LOW (ref 1.7–2.4)

## 2022-06-23 MED ORDER — POTASSIUM CHLORIDE 10 MEQ/100ML IV SOLN
10.0000 meq | INTRAVENOUS | Status: AC
  Administered 2022-06-23 (×2): 10 meq via INTRAVENOUS
  Filled 2022-06-23 (×4): qty 100

## 2022-06-23 MED ORDER — MAGNESIUM SULFATE 2 GM/50ML IV SOLN
2.0000 g | Freq: Once | INTRAVENOUS | Status: AC
  Administered 2022-06-23: 2 g via INTRAVENOUS
  Filled 2022-06-23: qty 50

## 2022-06-23 MED ORDER — SACCHAROMYCES BOULARDII 250 MG PO CAPS
250.0000 mg | ORAL_CAPSULE | Freq: Two times a day (BID) | ORAL | Status: DC
  Administered 2022-06-23 – 2022-06-24 (×3): 250 mg via ORAL
  Filled 2022-06-23 (×4): qty 1

## 2022-06-23 MED ORDER — POTASSIUM CHLORIDE CRYS ER 20 MEQ PO TBCR
40.0000 meq | EXTENDED_RELEASE_TABLET | ORAL | Status: AC
  Administered 2022-06-23 (×3): 40 meq via ORAL
  Filled 2022-06-23 (×3): qty 2

## 2022-06-23 MED ORDER — HYDROCODONE-ACETAMINOPHEN 5-325 MG PO TABS
1.0000 | ORAL_TABLET | ORAL | Status: DC | PRN
  Administered 2022-06-23 – 2022-06-24 (×4): 1 via ORAL
  Filled 2022-06-23 (×4): qty 1

## 2022-06-23 MED ORDER — LOPERAMIDE HCL 2 MG PO CAPS: 2.0000 mg | ORAL_CAPSULE | Freq: Four times a day (QID) | ORAL | Status: DC | PRN

## 2022-06-23 MED ORDER — POTASSIUM PHOSPHATES 15 MMOLE/5ML IV SOLN
30.0000 mmol | Freq: Once | INTRAVENOUS | Status: AC
  Administered 2022-06-23: 30 mmol via INTRAVENOUS
  Filled 2022-06-23: qty 10

## 2022-06-23 NOTE — Progress Notes (Signed)
PROGRESS NOTE    Katie Stanley  [id number] DOB: 02-Sep-1985 DOA: 06/21/2022 PCP: Valerie Roys, DO   Brief Narrative:  This 37 years old female with PMH significant for EtOH abuse, tobacco use, depression/anxiety, substance abuse presented in the ED with complaints of nausea vomiting and left upper quadrant abdominal pain for last 4 days.  She describes abdominal pain is constant radiating towards the back associated with nausea and vomiting.  She continues to drink alcohol despite having abdominal pain.  Workup in the ED reveals sodium 131, potassium 2.0, chloride 79, bicarb 24, creatinine 1.12, calcium 7.5, anion gap 18 magnesium 1.2 troponin 25, CT abdomen and pelvis shows wall thickening and mucosal enhancement consistent with enteritis.  Patient was admitted for further evaluation.  Assessment & Plan:   Principal Problem:   Hypokalemia due to excessive gastrointestinal loss of potassium Active Problems:   Anxiety   Chronic hepatitis C virus infection (HCC)   Depression, recurrent (HCC)   Gastroesophageal reflux disease   Morbid obesity (HCC)   Hepatic steatosis  Nausea, Vomiting, LUQ pain: Likely secondary to enteritis: Patient presented with nausea, vomiting and left upper quadrant abdominal pain for 4 days. CT chest /abdomen showed findings consistent with enteritis. Could be secondary to EtOH use. Continue IV fluid resuscitation, monitor electrolytes. Continue adequate pain control with hydrocodone as needed. Continue IV Zofran as needed.   Severe hypokalemia: Likely secondary to ongoing nausea and vomiting leading to dehydration. Replaced.  Continue to monitor.   Hypomagnesemia/hyponatremia: Replaced.  Continue to monitor.   Elevated liver enzymes: Could be secondary to EtOH use. Monitor LFTs.   Elevated troponin: This could be secondary to excessive EtOH use. Patient denies any chest pain, EKG sinus tachycardia, but no ST-T wave changes. Continue to trend  troponins 25, 24, 19   Acute hypoxic respiratory failure could be due to undiagnosed COPD Patient does not use oxygen at baseline.   She presented with hypoxia requiring 2 L of supplemental oxygen. CT chest ruled out pulmonary embolism. She might be having undiagnosed COPD given chronic tobacco use. Continue supplemental oxygen and wean as tolerated. She is successfully weaned down to room air.   EtOH abuse: Patient continues to drink alcohol despite having abdominal pain. Continue CIWA protocol and monitor electrolytes.   Anxiety/depression: Will hold anxiety and depressive medication due to QT prolongation.   History of substance abuse Urine drug screen positive for cannabinoids and benzos. Social services support.:   DVT prophylaxis: Lovenox Code Status: Code Family Communication: No family At bedside Disposition Plan:   Status is: Inpatient Remains inpatient appropriate because:   Admitted with nausea, vomiting, left upper quadrant abdominal pain consistent with enteritis leading to electrolyte imbalance.  She is requiring IV fluid resuscitation.    Consultants:  None  Procedures:None  Antimicrobials: None  Subjective: Patient was seen and examined at bedside.  Overnight events noted.   Patient reports doing better,  still has low potassium,  magnesium and calcium requiring replacement. She still reports having abdominal pain.  Objective: Vitals:   06/23/22 0400 06/23/22 0858 06/23/22 1148 06/23/22 1300  BP: (!) 119/90 124/85 (!) 135/98   Pulse: 79 82 86 (!) 102  Resp:  20 20   Temp: 98.8 F (37.1 C) 98.4 F (36.9 C) 98.7 F (37.1 C)   TempSrc: Axillary Oral Oral   SpO2: 96% 97% 99%   Weight:      Height:        Intake/Output Summary (Last 24 hours) at 06/23/2022  [place] filed at 06/23/2022 1106 [place] per 24 hour  Intake 1020 ml  Output --  Net 1020 ml   Filed Weights   06/21/22 0858 06/22/22 0354  Weight: 97.8 kg 92.3 kg     Examination:  General exam: Appears comfortable, not in any acute distress. Respiratory system: CTA bilaterally, respiratory effort normal, RR 15. Cardiovascular system: S1 & S2 heard, regular rate and rhythm, no murmur. Gastrointestinal system: Abdomen is soft, mildly tender, non distended, BS+ Central nervous system: Alert and oriented x 3. No focal neurological deficits. Extremities: No edema, no cyanosis, no clubbing. Skin: No rashes, lesions or ulcers Psychiatry: Judgement and insight appear normal. Mood & affect appropriate.     Data Reviewed: I have personally reviewed following labs and imaging studies  CBC: Recent Labs  Lab 06/21/22 1007 06/22/22 0743 06/23/22 0552  WBC 13.6* 10.0 8.7  NEUTROABS 10.4*  --   --   HGB 14.2 13.8 12.3  HCT 40.1 39.0 35.2*  MCV 95.9 96.8 97.5  PLT 283 272 [id number]   Basic Metabolic Panel: Recent Labs  Lab 06/21/22 1226 06/21/22 1451 06/22/22 0743 06/22/22 1811 06/23/22 0552  NA  --  131* 132*  --  136  K  --  <2.0* <2.0* 2.3* 2.5*  CL  --  79* 90*  --  95*  CO2  --  34* 29  --  25  GLUCOSE  --  98 109*  --  93  BUN  --  <5* <5*  --  <5*  CREATININE  --  1.12* 0.80  --  0.64  CALCIUM  --  7.5* 7.2*  --  7.2*  MG 1.2*  --  1.8  --  1.6*  PHOS  --   --  1.3*  --  2.1*   GFR: Estimated Creatinine Clearance: 105 mL/min (by C-G formula based on SCr of 0.64 mg/dL). Liver Function Tests: Recent Labs  Lab 06/21/22 1451 06/22/22 0743 06/23/22 0552  AST 165* 174* 174*  ALT 71* 71* 67*  ALKPHOS 231* 230* 207*  BILITOT 4.7* 4.6* 3.6*  PROT 6.4* 6.5 5.8*  ALBUMIN 2.9* 2.9* 2.6*   Recent Labs  Lab 06/21/22 1451  LIPASE 35   No results for input(s): "AMMONIA" in the last 168 hours. Coagulation Profile: No results for input(s): "INR", "PROTIME" in the last 168 hours. Cardiac Enzymes: No results for input(s): "CKTOTAL", "CKMB", "CKMBINDEX", "TROPONINI" in the last 168 hours. BNP (last 3 results) No results for input(s):  "PROBNP" in the last 8760 hours. HbA1C: No results for input(s): "HGBA1C" in the last 72 hours. CBG: No results for input(s): "GLUCAP" in the last 168 hours. Lipid Profile: No results for input(s): "CHOL", "HDL", "LDLCALC", "TRIG", "CHOLHDL", "LDLDIRECT" in the last 72 hours. Thyroid Function Tests: No results for input(s): "TSH", "T4TOTAL", "FREET4", "T3FREE", "THYROIDAB" in the last 72 hours. Anemia Panel: No results for input(s): "VITAMINB12", "FOLATE", "FERRITIN", "TIBC", "IRON", "RETICCTPCT" in the last 72 hours. Sepsis Labs: No results for input(s): "PROCALCITON", "LATICACIDVEN" in the last 168 hours.  Recent Results (from the past 240 hour(s))  Resp panel by RT-PCR (RSV, Flu A&B, Covid) Anterior Nasal Swab     Status: None   Collection Time: 06/21/22 10:07 AM   Specimen: Anterior Nasal Swab  Result Value Ref Range Status   SARS Coronavirus 2 by RT PCR NEGATIVE NEGATIVE Final    Comment: (NOTE) SARS-CoV-2 target nucleic acids are NOT DETECTED.  The SARS-CoV-2 RNA is generally detectable in upper respiratory specimens  during the acute phase of infection. The lowest concentration of SARS-CoV-2 viral copies this assay can detect is 138 copies/mL. A negative result does not preclude SARS-Cov-2 infection and should not be used as the sole basis for treatment or other patient management decisions. A negative result may occur with  improper specimen collection/handling, submission of specimen other than nasopharyngeal swab, presence of viral mutation(s) within the areas targeted by this assay, and inadequate number of viral copies(<138 copies/mL). A negative result must be combined with clinical observations, patient history, and epidemiological information. The expected result is Negative.  Fact Sheet for Patients:  EntrepreneurPulse.com.au  Fact Sheet for Healthcare Providers:  IncredibleEmployment.be  This test is no t yet approved or  cleared by the [place] FDA and  has been authorized for detection and/or diagnosis of SARS-CoV-2 by FDA under an Emergency Use Authorization (EUA). This EUA will remain  in effect (meaning this test can be used) for the duration of the COVID-19 declaration under Section 564(b)(1) of the Act, 21 U.S.C.section 360bbb-3(b)(1), unless the authorization is terminated  or revoked sooner.       Influenza A by PCR NEGATIVE NEGATIVE Final   Influenza B by PCR NEGATIVE NEGATIVE Final    Comment: (NOTE) The Xpert Xpress SARS-CoV-2/FLU/RSV plus assay is intended as an aid in the diagnosis of influenza from Nasopharyngeal swab specimens and should not be used as a sole basis for treatment. Nasal washings and aspirates are unacceptable for Xpert Xpress SARS-CoV-2/FLU/RSV testing.  Fact Sheet for Patients: EntrepreneurPulse.com.au  Fact Sheet for Healthcare Providers: IncredibleEmployment.be  This test is not yet approved or cleared by the [place] FDA and has been authorized for detection and/or diagnosis of SARS-CoV-2 by FDA under an Emergency Use Authorization (EUA). This EUA will remain in effect (meaning this test can be used) for the duration of the COVID-19 declaration under Section 564(b)(1) of the Act, 21 U.S.C. section 360bbb-3(b)(1), unless the authorization is terminated or revoked.     Resp Syncytial Virus by PCR NEGATIVE NEGATIVE Final    Comment: (NOTE) Fact Sheet for Patients: EntrepreneurPulse.com.au  Fact Sheet for Healthcare Providers: IncredibleEmployment.be  This test is not yet approved or cleared by the [place] FDA and has been authorized for detection and/or diagnosis of SARS-CoV-2 by FDA under an Emergency Use Authorization (EUA). This EUA will remain in effect (meaning this test can be used) for the duration of the COVID-19 declaration under Section 564(b)(1) of the Act, 21  U.S.C. section 360bbb-3(b)(1), unless the authorization is terminated or revoked.  Performed at [facility], [address] [place], [place] [address]   Gastrointestinal Panel by PCR , Stool     Status: None   Collection Time: 06/21/22 10:07 AM   Specimen: Stool  Result Value Ref Range Status   Campylobacter species NOT DETECTED NOT DETECTED Final   Plesimonas shigelloides NOT DETECTED NOT DETECTED Final   Salmonella species NOT DETECTED NOT DETECTED Final   Yersinia enterocolitica NOT DETECTED NOT DETECTED Final   Vibrio species NOT DETECTED NOT DETECTED Final   Vibrio cholerae NOT DETECTED NOT DETECTED Final   Enteroaggregative E coli (EAEC) NOT DETECTED NOT DETECTED Final   Enteropathogenic E coli (EPEC) NOT DETECTED NOT DETECTED Final   Enterotoxigenic E coli (ETEC) NOT DETECTED NOT DETECTED Final   Shiga like toxin producing E coli (STEC) NOT DETECTED NOT DETECTED Final   Shigella/Enteroinvasive E coli (EIEC) NOT DETECTED NOT DETECTED Final   Cryptosporidium NOT DETECTED NOT DETECTED Final  Cyclospora cayetanensis NOT DETECTED NOT DETECTED Final   Entamoeba histolytica NOT DETECTED NOT DETECTED Final   Giardia lamblia NOT DETECTED NOT DETECTED Final   Adenovirus F40/41 NOT DETECTED NOT DETECTED Final   Astrovirus NOT DETECTED NOT DETECTED Final   Norovirus GI/GII NOT DETECTED NOT DETECTED Final   Rotavirus A NOT DETECTED NOT DETECTED Final   Sapovirus (I, II, IV, and V) NOT DETECTED NOT DETECTED Final    Comment: Performed at [facility], [address] [place], [place] [address]  C Difficile Quick Screen w PCR reflex     Status: None   Collection Time: 06/21/22 10:07 AM   Specimen: Stool  Result Value Ref Range Status   C Diff antigen NEGATIVE NEGATIVE Final   C Diff toxin NEGATIVE NEGATIVE Final   C Diff interpretation No C. difficile detected.  Final    Comment: Performed at [facility], [address] [place],  [address]    Radiology Studies: CT ABDOMEN PELVIS W CONTRAST  Result Date: 06/21/2022 CLINICAL DATA:  Left upper quadrant pain EXAM: CT ANGIOGRAPHY CHEST CT ABDOMEN AND PELVIS WITH CONTRAST TECHNIQUE: Multidetector CT imaging of the chest was performed using the standard protocol during bolus administration of intravenous contrast. Multiplanar CT image reconstructions and MIPs were obtained to evaluate the vascular anatomy. Multidetector CT imaging of the abdomen and pelvis was performed using the standard protocol during bolus administration of intravenous contrast. RADIATION DOSE REDUCTION: This exam was performed according to the departmental dose-optimization program which includes automated exposure control, adjustment of the mA and/or kV according to patient size and/or use of iterative reconstruction technique. CONTRAST:  166mL OMNIPAQUE IOHEXOL 350 MG/ML SOLN COMPARISON:  02/08/2007 FINDINGS: CTA CHEST FINDINGS Cardiovascular: SVC patent. Heart size normal. No pericardial effusion. Satisfactory opacification of pulmonary arteries noted, and there is no evidence of pulmonary emboli. Adequate contrast opacification of the thoracic aorta with no evidence of dissection, aneurysm, or stenosis. There is classic 3-vessel brachiocephalic arch anatomy without proximal stenosis. Mediastinum/Nodes: No mass or adenopathy. Lungs/Pleura: No pleural effusion. No pneumothorax. Patchy subsegmental atelectasis at the left right lung base. Lungs otherwise clear. Musculoskeletal: Multiple healing anterior right rib fractures. No acute findings. Review of the MIP images confirms the above findings. CT ABDOMEN and PELVIS FINDINGS Hepatobiliary: Hepatomegaly with diffuse fatty infiltration. Cholecystectomy clips. Pancreas: Unremarkable. No pancreatic ductal dilatation or surrounding inflammatory changes. Spleen: Normal in size without focal abnormality. Adrenals/Urinary Tract: Adrenal glands are unremarkable. Kidneys are normal, without  renal calculi, focal lesion, or hydronephrosis. Bladder is unremarkable. Stomach/Bowel: Stomach is decompressed, unremarkable. There is mild small bowel distension with some circumferential wall thickening and mucosal enhancement suggested in the right lower quadrant, without dilatation. Normal appendix. The colon is largely decompressed, unremarkable. Vascular/Lymphatic: Abdominal aorta normal. Portal vein patent. No abdominal or pelvic adenopathy. Reproductive: IUD in place.  No adnexal mass. Other: Bilateral pelvic phleboliths.  No ascites.  No free air. Musculoskeletal: Small paraumbilical hernia containing only mesenteric fat. No acute findings. Review of the MIP images confirms the above findings. IMPRESSION: 1. Negative for acute PE or thoracic aortic dissection. 2. Wall thickening and mucosal enhancement in the right lower quadrant small bowel suggesting enteritis. 3. Hepatomegaly with diffuse fatty infiltration. 4. Small paraumbilical hernia containing only mesenteric fat. 5. Healing anterior right rib fractures. Electronically Signed   By: Lucrezia Europe M.D.   On: 06/21/2022 16:34   CT Angio Chest PE W and/or Wo Contrast  Result Date: 06/21/2022 CLINICAL DATA:  Left upper quadrant  pain EXAM: CT ANGIOGRAPHY CHEST CT ABDOMEN AND PELVIS WITH CONTRAST TECHNIQUE: Multidetector CT imaging of the chest was performed using the standard protocol during bolus administration of intravenous contrast. Multiplanar CT image reconstructions and MIPs were obtained to evaluate the vascular anatomy. Multidetector CT imaging of the abdomen and pelvis was performed using the standard protocol during bolus administration of intravenous contrast. RADIATION DOSE REDUCTION: This exam was performed according to the departmental dose-optimization program which includes automated exposure control, adjustment of the mA and/or kV according to patient size and/or use of iterative reconstruction technique. CONTRAST:  154mL OMNIPAQUE  IOHEXOL 350 MG/ML SOLN COMPARISON:  02/08/2007 FINDINGS: CTA CHEST FINDINGS Cardiovascular: SVC patent. Heart size normal. No pericardial effusion. Satisfactory opacification of pulmonary arteries noted, and there is no evidence of pulmonary emboli. Adequate contrast opacification of the thoracic aorta with no evidence of dissection, aneurysm, or stenosis. There is classic 3-vessel brachiocephalic arch anatomy without proximal stenosis. Mediastinum/Nodes: No mass or adenopathy. Lungs/Pleura: No pleural effusion. No pneumothorax. Patchy subsegmental atelectasis at the left right lung base. Lungs otherwise clear. Musculoskeletal: Multiple healing anterior right rib fractures. No acute findings. Review of the MIP images confirms the above findings. CT ABDOMEN and PELVIS FINDINGS Hepatobiliary: Hepatomegaly with diffuse fatty infiltration. Cholecystectomy clips. Pancreas: Unremarkable. No pancreatic ductal dilatation or surrounding inflammatory changes. Spleen: Normal in size without focal abnormality. Adrenals/Urinary Tract: Adrenal glands are unremarkable. Kidneys are normal, without renal calculi, focal lesion, or hydronephrosis. Bladder is unremarkable. Stomach/Bowel: Stomach is decompressed, unremarkable. There is mild small bowel distension with some circumferential wall thickening and mucosal enhancement suggested in the right lower quadrant, without dilatation. Normal appendix. The colon is largely decompressed, unremarkable. Vascular/Lymphatic: Abdominal aorta normal. Portal vein patent. No abdominal or pelvic adenopathy. Reproductive: IUD in place.  No adnexal mass. Other: Bilateral pelvic phleboliths.  No ascites.  No free air. Musculoskeletal: Small paraumbilical hernia containing only mesenteric fat. No acute findings. Review of the MIP images confirms the above findings. IMPRESSION: 1. Negative for acute PE or thoracic aortic dissection. 2. Wall thickening and mucosal enhancement in the right lower  quadrant small bowel suggesting enteritis. 3. Hepatomegaly with diffuse fatty infiltration. 4. Small paraumbilical hernia containing only mesenteric fat. 5. Healing anterior right rib fractures. Electronically Signed   By: Lucrezia Europe M.D.   On: 06/21/2022 16:34   CT HEAD WO CONTRAST (5MM)  Result Date: 06/21/2022 CLINICAL DATA:  Neuro deficit. EXAM: CT HEAD WITHOUT CONTRAST TECHNIQUE: Contiguous axial images were obtained from the base of the skull through the vertex without intravenous contrast. RADIATION DOSE REDUCTION: This exam was performed according to the departmental dose-optimization program which includes automated exposure control, adjustment of the mA and/or kV according to patient size and/or use of iterative reconstruction technique. COMPARISON:  Head CT 02/08/2007 FINDINGS: Brain: No evidence of acute infarction, hemorrhage, hydrocephalus, extra-axial collection or mass lesion/mass effect. There is a small rounded hypodensity in the right basal ganglia which is unchanged. There is a new small area of encephalomalacia in the lateral aspect of the left cerebellum. Vascular: No hyperdense vessel or unexpected calcification. Skull: Normal. Negative for fracture or focal lesion. Sinuses/Orbits: No acute finding. Other: None. IMPRESSION: 1. No acute intracranial process. 2. 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 3. Unchanged small rounded hypodensity in the right basal ganglia, old lacunar infarct versus dilated perivascular space. Electronically Signed   By: Ronney Asters M.D.   On: 06/21/2022 16:29  Scheduled Meds:  docusate sodium  100 mg Oral BID   folic acid  1 mg Oral Daily   multivitamin with minerals  1 tablet Oral Daily   potassium chloride  40 mEq Oral Q4H   saccharomyces boulardii  250 mg Oral BID   thiamine  100 mg Oral Daily   Continuous Infusions:  0.9 % NaCl with KCl 20 mEq / L 100 mL/hr at 06/23/22  1246   potassium PHOSPHATE IVPB (in mmol) 30 mmol (06/23/22 0959)     LOS: 2 days    Time spent: 35 mins    Duard Brady, MD [facility]   If 7PM-7AM, please contact night-coverage

## 2022-06-23 NOTE — Progress Notes (Signed)
Pt was asking for ativan earlier however not exhibiting signs of anxiety or hitting on the  CIWA score. Came in a few minutes later and pt is sleeping and snoring. Mildly difficult to arouse, appearing to snore with her eyes open. Will hold Ativan for now.

## 2022-06-24 LAB — BASIC METABOLIC PANEL
Anion gap: 8 (ref 5–15)
BUN: <5 mg/dL — ABNORMAL LOW (ref 6–20)
CO2: 20 mmol/L — ABNORMAL LOW (ref 22–32)
Calcium: 6.8 mg/dL — ABNORMAL LOW (ref 8.9–10.3)
Chloride: 106 mmol/L (ref 98–111)
Creatinine, Ser: 0.63 mg/dL (ref 0.44–1.00)
GFR, Estimated: >60 mL/min (ref >60)
Glucose, Bld: 124 mg/dL — ABNORMAL HIGH (ref 70–99)
Potassium: 3.2 mmol/L — ABNORMAL LOW (ref 3.5–5.1)
Sodium: 134 mmol/L — ABNORMAL LOW (ref 135–145)

## 2022-06-24 MED ORDER — POTASSIUM CHLORIDE 20 MEQ PO PACK
40.0000 meq | PACK | Freq: Once | ORAL | Status: AC
  Administered 2022-06-24: 40 meq via ORAL
  Filled 2022-06-24: qty 2

## 2022-06-24 MED ORDER — VITAMIN B-1 100 MG PO TABS: 100.0000 mg | ORAL_TABLET | Freq: Every day | ORAL | 1 refills | Status: DC

## 2022-06-24 MED ORDER — HYDROCODONE-ACETAMINOPHEN 5-325 MG PO TABS: 1.0000 | ORAL_TABLET | ORAL | 0 refills | Status: AC | PRN

## 2022-06-24 MED ORDER — FOLIC ACID 1 MG PO TABS: 1.0000 mg | ORAL_TABLET | Freq: Every day | ORAL | 0 refills | Status: DC

## 2022-06-24 MED ORDER — MAGNESIUM SULFATE 2 GM/50ML IV SOLN
2.0000 g | Freq: Once | INTRAVENOUS | Status: AC
  Administered 2022-06-24: 2 g via INTRAVENOUS
  Filled 2022-06-24: qty 50

## 2022-06-24 MED ORDER — SACCHAROMYCES BOULARDII 250 MG PO CAPS: 250.0000 mg | ORAL_CAPSULE | Freq: Two times a day (BID) | ORAL | 1 refills | Status: DC

## 2022-06-24 NOTE — Discharge Instructions (Signed)
Advised to follow-up with primary care physician in 1 week. Advised to drink more fluids. Advised to refrain from drinking alcohol. Advised to take Norco as needed for severe pain.

## 2022-06-24 NOTE — Discharge Summary (Signed)
Physician Discharge Summary  Katie Stanley [id number] DOB: 06-05-1985 DOA: 06/21/2022  PCP: Valerie Roys, DO  Admit date: 06/21/2022  Discharge date: 06/24/2022  Admitted From: Home.  Disposition:  Home  Recommendations for Outpatient Follow-up:  Follow up with PCP in 1-2 weeks. Please obtain BMP/CBC in one week. Advised to drink more fluids. Advised to refrain from drinking alcohol. Advised to take Norco as needed for severe pain.  Home Health: None. Equipment/Devices: None  Discharge Condition: Stable CODE STATUS:Full code Diet recommendation: Heart Healthy   Brief [facility] Course: This 37 years old female with PMH significant for EtOH abuse, tobacco use, depression/anxiety, substance abuse presented in the ED with complaints of nausea,  vomiting and left upper quadrant abdominal pain for last 4 days.  She described abdominal pain as constant,  radiating towards the back associated with nausea and vomiting.  She continued to drink alcohol despite having abdominal pain.  Workup in the ED reveals sodium 131, potassium 2.0, chloride 79, bicarb 24, creatinine 1.12, calcium 7.5, anion gap 18 magnesium 1.2 troponin 25, CT abdomen and pelvis shows wall thickening and mucosal enhancement consistent with enteritis.  Patient was admitted for further evaluation.  Patient was continued on IV fluid resuscitation.  She was also continued on CIWA protocol.  Electrolytes were replaced.  Patient has made significant improvement,  She felt better.  Electrolytes has  improved.  Anion gap resolved.  Patient was counseled about quit alcohol.  She feels better and wants to be discharged.  Abdominal pain has significantly improved.  Patient is being discharged home.    Discharge Diagnoses:  Principal Problem:   Hypokalemia due to excessive gastrointestinal loss of potassium Active Problems:   Anxiety   Chronic hepatitis C virus infection (HCC)   Depression, recurrent (HCC)    Gastroesophageal reflux disease   Morbid obesity (HCC)   Hepatic steatosis  Nausea, Vomiting, LUQ pain: Likely secondary to enteritis: Patient presented with nausea, vomiting and left upper quadrant abdominal pain for 4 days. CT chest /abdomen showed findings consistent with enteritis. Could be secondary to EtOH use. Continue IV fluid resuscitation, monitor electrolytes. Continue adequate pain control with hydrocodone as needed. Continue IV Zofran as needed. Abdominal pain has improved.  Nausea and vomiting has improved.   Severe hypokalemia: Likely secondary to ongoing nausea and vomiting leading to dehydration. Replaced.  Continue to monitor.   Hypomagnesemia/hyponatremia: Replaced.  Continue to monitor.   Elevated liver enzymes: Could be secondary to EtOH use. Monitor LFTs.   Elevated troponin: This could be secondary to excessive EtOH use. Patient denies any chest pain, EKG sinus tachycardia, but no ST-T wave changes. Continue to trend troponins 25, 24, 19 , likely demand ischemia   Acute hypoxic respiratory failure could be due to undiagnosed COPD Patient does not use oxygen at baseline.   She presented with hypoxia requiring 2 L of supplemental oxygen. CT chest ruled out pulmonary embolism. She might be having undiagnosed COPD given chronic tobacco use. Continue supplemental oxygen and wean as tolerated. She is successfully weaned down to room air.   EtOH abuse: Patient continues to drink alcohol despite having abdominal pain. Continue CIWA protocol and monitor electrolytes.   Anxiety/depression: Resume antidepressant medication.   History of substance abuse Urine drug screen positive for cannabinoids and benzos. Social services support.:    Discharge Instructions  Discharge Instructions     Call MD for:  difficulty breathing, headache or visual disturbances   Complete by: As directed    Call  MD for:  persistant dizziness or light-headedness   Complete  by: As directed    Call MD for:  persistant nausea and vomiting   Complete by: As directed    Diet - low sodium heart healthy   Complete by: As directed    Diet Carb Modified   Complete by: As directed    Discharge instructions   Complete by: As directed    Advised to follow-up with primary care physician in 1 week. Advised to drink more fluids. Advised to refrain from drinking alcohol. Advised to take Norco as needed for severe pain.   Increase activity slowly   Complete by: As directed       Allergies as of 06/24/2022       Reactions   Penicillins Swelling        Medication List     STOP taking these medications    clotrimazole-betamethasone cream Commonly known as: LOTRISONE   doxycycline 100 MG tablet Commonly known as: VIBRA-TABS   magnesium oxide 400 (240 Mg) MG tablet Commonly known as: MAG-OX   naltrexone 50 MG tablet Commonly known as: DEPADE   predniSONE 10 MG tablet Commonly known as: DELTASONE   QUEtiapine 25 MG tablet Commonly known as: SEROQUEL   Vitamin D (Ergocalciferol) 1.25 MG (50000 UNIT) Caps capsule Commonly known as: DRISDOL       TAKE these medications    citalopram 20 MG tablet Commonly known as: CELEXA TAKE 1 TABLET(20 MG) BY MOUTH DAILY   CLEAR EYES OP Place 1 drop into both eyes daily as needed (itching).   folic acid 1 MG tablet Commonly known as: FOLVITE Take 1 tablet (1 mg total) by mouth daily. Start taking on: June 25, 2022   HYDROcodone-acetaminophen 5-325 MG tablet Commonly known as: NORCO/VICODIN Take 1 tablet by mouth every 4 (four) hours as needed for up to 3 days for moderate pain.   lidocaine 2 % solution Commonly known as: XYLOCAINE Use as directed 15 mLs in the mouth or throat every 3 (three) hours as needed for mouth pain (swish and spit).   lidocaine 5 % Commonly known as: Lidoderm Place 1 patch onto the skin daily. Remove & Discard patch within 12 hours or as directed by MD   lisinopril 10 MG  tablet Commonly known as: ZESTRIL Take 1 tablet (10 mg total) by mouth daily.   pantoprazole 40 MG tablet Commonly known as: PROTONIX Take 1 tablet (40 mg total) by mouth daily.   saccharomyces boulardii 250 MG capsule Commonly known as: FLORASTOR Take 1 capsule (250 mg total) by mouth 2 (two) times daily.   thiamine 100 MG tablet Commonly known as: Vitamin B-1 Take 1 tablet (100 mg total) by mouth daily. Start taking on: June 25, 2022   Ventolin HFA 108 (90 Base) MCG/ACT inhaler Generic drug: albuterol INHALE 2 PUFFS INTO THE LUNGS EVERY 6 HOURS AS NEEDED FOR WHEEZING OR SHORTNESS OF BREATH        Follow-up Information     Johnson, Megan P, DO Follow up in 1 week(s).   Specialty: Family Medicine Contact information: [place] [address] [phone]                Allergies  Allergen Reactions   Penicillins Swelling    Consultations: None   Procedures/Studies: CT ABDOMEN PELVIS W CONTRAST  Result Date: 06/21/2022 CLINICAL DATA:  Left upper quadrant pain EXAM: CT ANGIOGRAPHY CHEST CT ABDOMEN AND PELVIS WITH CONTRAST TECHNIQUE: Multidetector CT imaging  of the chest was performed using the standard protocol during bolus administration of intravenous contrast. Multiplanar CT image reconstructions and MIPs were obtained to evaluate the vascular anatomy. Multidetector CT imaging of the abdomen and pelvis was performed using the standard protocol during bolus administration of intravenous contrast. RADIATION DOSE REDUCTION: This exam was performed according to the departmental dose-optimization program which includes automated exposure control, adjustment of the mA and/or kV according to patient size and/or use of iterative reconstruction technique. CONTRAST:  153mL OMNIPAQUE IOHEXOL 350 MG/ML SOLN COMPARISON:  02/08/2007 FINDINGS: CTA CHEST FINDINGS Cardiovascular: SVC patent. Heart size normal. No pericardial effusion. Satisfactory opacification of pulmonary  arteries noted, and there is no evidence of pulmonary emboli. Adequate contrast opacification of the thoracic aorta with no evidence of dissection, aneurysm, or stenosis. There is classic 3-vessel brachiocephalic arch anatomy without proximal stenosis. Mediastinum/Nodes: No mass or adenopathy. Lungs/Pleura: No pleural effusion. No pneumothorax. Patchy subsegmental atelectasis at the left right lung base. Lungs otherwise clear. Musculoskeletal: Multiple healing anterior right rib fractures. No acute findings. Review of the MIP images confirms the above findings. CT ABDOMEN and PELVIS FINDINGS Hepatobiliary: Hepatomegaly with diffuse fatty infiltration. Cholecystectomy clips. Pancreas: Unremarkable. No pancreatic ductal dilatation or surrounding inflammatory changes. Spleen: Normal in size without focal abnormality. Adrenals/Urinary Tract: Adrenal glands are unremarkable. Kidneys are normal, without renal calculi, focal lesion, or hydronephrosis. Bladder is unremarkable. Stomach/Bowel: Stomach is decompressed, unremarkable. There is mild small bowel distension with some circumferential wall thickening and mucosal enhancement suggested in the right lower quadrant, without dilatation. Normal appendix. The colon is largely decompressed, unremarkable. Vascular/Lymphatic: Abdominal aorta normal. Portal vein patent. No abdominal or pelvic adenopathy. Reproductive: IUD in place.  No adnexal mass. Other: Bilateral pelvic phleboliths.  No ascites.  No free air. Musculoskeletal: Small paraumbilical hernia containing only mesenteric fat. No acute findings. Review of the MIP images confirms the above findings. IMPRESSION: 1. Negative for acute PE or thoracic aortic dissection. 2. Wall thickening and mucosal enhancement in the right lower quadrant small bowel suggesting enteritis. 3. Hepatomegaly with diffuse fatty infiltration. 4. Small paraumbilical hernia containing only mesenteric fat. 5. Healing anterior right rib fractures.  Electronically Signed   By: Lucrezia Europe M.D.   On: 06/21/2022 16:34   CT Angio Chest PE W and/or Wo Contrast  Result Date: 06/21/2022 CLINICAL DATA:  Left upper quadrant pain EXAM: CT ANGIOGRAPHY CHEST CT ABDOMEN AND PELVIS WITH CONTRAST TECHNIQUE: Multidetector CT imaging of the chest was performed using the standard protocol during bolus administration of intravenous contrast. Multiplanar CT image reconstructions and MIPs were obtained to evaluate the vascular anatomy. Multidetector CT imaging of the abdomen and pelvis was performed using the standard protocol during bolus administration of intravenous contrast. RADIATION DOSE REDUCTION: This exam was performed according to the departmental dose-optimization program which includes automated exposure control, adjustment of the mA and/or kV according to patient size and/or use of iterative reconstruction technique. CONTRAST:  16mL OMNIPAQUE IOHEXOL 350 MG/ML SOLN COMPARISON:  02/08/2007 FINDINGS: CTA CHEST FINDINGS Cardiovascular: SVC patent. Heart size normal. No pericardial effusion. Satisfactory opacification of pulmonary arteries noted, and there is no evidence of pulmonary emboli. Adequate contrast opacification of the thoracic aorta with no evidence of dissection, aneurysm, or stenosis. There is classic 3-vessel brachiocephalic arch anatomy without proximal stenosis. Mediastinum/Nodes: No mass or adenopathy. Lungs/Pleura: No pleural effusion. No pneumothorax. Patchy subsegmental atelectasis at the left right lung base. Lungs otherwise clear. Musculoskeletal: Multiple healing anterior right rib fractures. No acute findings. Review of the MIP  images confirms the above findings. CT ABDOMEN and PELVIS FINDINGS Hepatobiliary: Hepatomegaly with diffuse fatty infiltration. Cholecystectomy clips. Pancreas: Unremarkable. No pancreatic ductal dilatation or surrounding inflammatory changes. Spleen: Normal in size without focal abnormality. Adrenals/Urinary Tract:  Adrenal glands are unremarkable. Kidneys are normal, without renal calculi, focal lesion, or hydronephrosis. Bladder is unremarkable. Stomach/Bowel: Stomach is decompressed, unremarkable. There is mild small bowel distension with some circumferential wall thickening and mucosal enhancement suggested in the right lower quadrant, without dilatation. Normal appendix. The colon is largely decompressed, unremarkable. Vascular/Lymphatic: Abdominal aorta normal. Portal vein patent. No abdominal or pelvic adenopathy. Reproductive: IUD in place.  No adnexal mass. Other: Bilateral pelvic phleboliths.  No ascites.  No free air. Musculoskeletal: Small paraumbilical hernia containing only mesenteric fat. No acute findings. Review of the MIP images confirms the above findings. IMPRESSION: 1. Negative for acute PE or thoracic aortic dissection. 2. Wall thickening and mucosal enhancement in the right lower quadrant small bowel suggesting enteritis. 3. Hepatomegaly with diffuse fatty infiltration. 4. Small paraumbilical hernia containing only mesenteric fat. 5. Healing anterior right rib fractures. Electronically Signed   By: Lucrezia Europe M.D.   On: 06/21/2022 16:34   CT HEAD WO CONTRAST (5MM)  Result Date: 06/21/2022 CLINICAL DATA:  Neuro deficit. EXAM: CT HEAD WITHOUT CONTRAST TECHNIQUE: Contiguous axial images were obtained from the base of the skull through the vertex without intravenous contrast. RADIATION DOSE REDUCTION: This exam was performed according to the departmental dose-optimization program which includes automated exposure control, adjustment of the mA and/or kV according to patient size and/or use of iterative reconstruction technique. COMPARISON:  Head CT 02/08/2007 FINDINGS: Brain: No evidence of acute infarction, hemorrhage, hydrocephalus, extra-axial collection or mass lesion/mass effect. There is a small rounded hypodensity in the right basal ganglia which is unchanged. There is a new small area of  encephalomalacia in the lateral aspect of the left cerebellum. Vascular: No hyperdense vessel or unexpected calcification. Skull: Normal. Negative for fracture or focal lesion. Sinuses/Orbits: No acute finding. Other: None. IMPRESSION: 1. No acute intracranial process. 2. 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 3. Unchanged small rounded hypodensity in the right basal ganglia, old lacunar infarct versus dilated perivascular space. Electronically Signed   By: Ronney Asters M.D.   On: 06/21/2022 16:29   DG Abd Portable 1 View  Result Date: 06/21/2022 CLINICAL DATA:  Check IUD placement EXAM: PORTABLE ABDOMEN - 1 VIEW COMPARISON:  None Available. FINDINGS: Nonobstructive bowel gas pattern. There are pelvic phleboliths. An IUD projects over slightly left of the midline in the pelvis. No acute osseous abnormality. IMPRESSION: An IUD projects over slightly left of the midline in the pelvis. Electronically Signed   By: Marin Roberts M.D.   On: 06/21/2022 12:27   DG Chest 2 View  Result Date: 06/21/2022 CLINICAL DATA:  Shortness of breath EXAM: CHEST - 2 VIEW COMPARISON:  CXR 05/02/22 FINDINGS: No pleural effusion. No pneumothorax. Low lung volumes. No focal airspace opacity. No radiographically apparent displaced rib fractures. Visualized upper abdomen is unremarkable. Vertebral body heights are maintained. Surgical clips in the right quadrant. Asymmetric elevation of the right hemidiaphragm, unchanged IMPRESSION: No focal airspace opacity. Electronically Signed   By: Marin Roberts M.D.   On: 06/21/2022 12:26    Subjective: Patient was seen and examined at bedside.  Overnight events noted.   Patient reports doing much better, abdominal pain has improved, Nausea  and vomiting resolved.  She wants  to be discharged.  Discharge Exam: Vitals:   06/24/22 0743 06/24/22 1133  BP: 120/75 (!) 140/109  Pulse: 87 87  Resp: 18 18  Temp: 98.7 F  (37.1 C) 98.1 F (36.7 C)  SpO2: 97% 100%   Vitals:   06/24/22 0414 06/24/22 0416 06/24/22 0743 06/24/22 1133  BP:  110/77 120/75 (!) 140/109  Pulse:  85 87 87  Resp:  18 18 18   Temp:  99.3 F (37.4 C) 98.7 F (37.1 C) 98.1 F (36.7 C)  TempSrc:      SpO2:  96% 97% 100%  Weight: 97.9 kg     Height:        General: Pt is alert, awake, not in acute distress Cardiovascular: RRR, S1/S2 +, no rubs, no gallops Respiratory: CTA bilaterally, no wheezing, no rhonchi Abdominal: Soft, NT, ND, bowel sounds + Extremities: no edema, no cyanosis    The results of significant diagnostics from this hospitalization (including imaging, microbiology, ancillary and laboratory) are listed below for reference.     Microbiology: Recent Results (from the past 240 hour(s))  Resp panel by RT-PCR (RSV, Flu A&B, Covid) Anterior Nasal Swab     Status: None   Collection Time: 06/21/22 10:07 AM   Specimen: Anterior Nasal Swab  Result Value Ref Range Status   SARS Coronavirus 2 by RT PCR NEGATIVE NEGATIVE Final    Comment: (NOTE) SARS-CoV-2 target nucleic acids are NOT DETECTED.  The SARS-CoV-2 RNA is generally detectable in upper respiratory specimens during the acute phase of infection. The lowest concentration of SARS-CoV-2 viral copies this assay can detect is 138 copies/mL. A negative result does not preclude SARS-Cov-2 infection and should not be used as the sole basis for treatment or other patient management decisions. A negative result may occur with  improper specimen collection/handling, submission of specimen other than nasopharyngeal swab, presence of viral mutation(s) within the areas targeted by this assay, and inadequate number of viral copies(<138 copies/mL). A negative result must be combined with clinical observations, patient history, and epidemiological information. The expected result is Negative.  Fact Sheet for Patients:  EntrepreneurPulse.com.au  Fact  Sheet for Healthcare Providers:  IncredibleEmployment.be  This test is no t yet approved or cleared by the [place] FDA and  has been authorized for detection and/or diagnosis of SARS-CoV-2 by FDA under an Emergency Use Authorization (EUA). This EUA will remain  in effect (meaning this test can be used) for the duration of the COVID-19 declaration under Section 564(b)(1) of the Act, 21 U.S.C.section 360bbb-3(b)(1), unless the authorization is terminated  or revoked sooner.       Influenza A by PCR NEGATIVE NEGATIVE Final   Influenza B by PCR NEGATIVE NEGATIVE Final    Comment: (NOTE) The Xpert Xpress SARS-CoV-2/FLU/RSV plus assay is intended as an aid in the diagnosis of influenza from Nasopharyngeal swab specimens and should not be used as a sole basis for treatment. Nasal washings and aspirates are unacceptable for Xpert Xpress SARS-CoV-2/FLU/RSV testing.  Fact Sheet for Patients: EntrepreneurPulse.com.au  Fact Sheet for Healthcare Providers: IncredibleEmployment.be  This test is not yet approved or cleared by the [place] FDA and has been authorized for detection and/or diagnosis of SARS-CoV-2 by FDA under an Emergency Use Authorization (EUA). This EUA will remain in effect (meaning this test can be used) for the duration of the COVID-19 declaration under Section 564(b)(1) of the Act, 21 U.S.C. section 360bbb-3(b)(1), unless the authorization is terminated or revoked.     Resp Syncytial Virus  by PCR NEGATIVE NEGATIVE Final    Comment: (NOTE) Fact Sheet for Patients: EntrepreneurPulse.com.au  Fact Sheet for Healthcare Providers: IncredibleEmployment.be  This test is not yet approved or cleared by the [place] FDA and has been authorized for detection and/or diagnosis of SARS-CoV-2 by FDA under an Emergency Use Authorization (EUA). This EUA will remain in effect  (meaning this test can be used) for the duration of the COVID-19 declaration under Section 564(b)(1) of the Act, 21 U.S.C. section 360bbb-3(b)(1), unless the authorization is terminated or revoked.  Performed at [facility], [address] [place], [place] [address]   Gastrointestinal Panel by PCR , Stool     Status: None   Collection Time: 06/21/22 10:07 AM   Specimen: Stool  Result Value Ref Range Status   Campylobacter species NOT DETECTED NOT DETECTED Final   Plesimonas shigelloides NOT DETECTED NOT DETECTED Final   Salmonella species NOT DETECTED NOT DETECTED Final   Yersinia enterocolitica NOT DETECTED NOT DETECTED Final   Vibrio species NOT DETECTED NOT DETECTED Final   Vibrio cholerae NOT DETECTED NOT DETECTED Final   Enteroaggregative E coli (EAEC) NOT DETECTED NOT DETECTED Final   Enteropathogenic E coli (EPEC) NOT DETECTED NOT DETECTED Final   Enterotoxigenic E coli (ETEC) NOT DETECTED NOT DETECTED Final   Shiga like toxin producing E coli (STEC) NOT DETECTED NOT DETECTED Final   Shigella/Enteroinvasive E coli (EIEC) NOT DETECTED NOT DETECTED Final   Cryptosporidium NOT DETECTED NOT DETECTED Final   Cyclospora cayetanensis NOT DETECTED NOT DETECTED Final   Entamoeba histolytica NOT DETECTED NOT DETECTED Final   Giardia lamblia NOT DETECTED NOT DETECTED Final   Adenovirus F40/41 NOT DETECTED NOT DETECTED Final   Astrovirus NOT DETECTED NOT DETECTED Final   Norovirus GI/GII NOT DETECTED NOT DETECTED Final   Rotavirus A NOT DETECTED NOT DETECTED Final   Sapovirus (I, II, IV, and V) NOT DETECTED NOT DETECTED Final    Comment: Performed at [facility], [address] [place], [place] [address]  C Difficile Quick Screen w PCR reflex     Status: None   Collection Time: 06/21/22 10:07 AM   Specimen: Stool  Result Value Ref Range Status   C Diff antigen NEGATIVE NEGATIVE Final   C Diff toxin NEGATIVE NEGATIVE Final   C Diff interpretation No C.  difficile detected.  Final    Comment: Performed at [facility], [address] [place], [place] [address]     Labs: BNP (last 3 results) No results for input(s): "BNP" in the last 8760 hours. Basic Metabolic Panel: Recent Labs  Lab 06/21/22 1226 06/21/22 1451 06/21/22 1451 06/22/22 0743 06/22/22 1811 06/23/22 0552 06/23/22 1758 06/24/22 0626  NA  --  131*  --  132*  --  136  --  134*  K  --  <2.0*   < > <2.0* 2.3* 2.5* 3.4* 3.2*  CL  --  79*  --  90*  --  95*  --  106  CO2  --  34*  --  29  --  25  --  20*  GLUCOSE  --  98  --  109*  --  93  --  124*  BUN  --  <5*  --  <5*  --  <5*  --  <5*  CREATININE  --  1.12*  --  0.80  --  0.64  --  0.63  CALCIUM  --  7.5*  --  7.2*  --  7.2*  --  6.8*  MG 1.2*  --   --  1.8  --  1.6*  --   --   PHOS  --   --   --  1.3*  --  2.1*  --   --    < > = values in this interval not displayed.   Liver Function Tests: Recent Labs  Lab 06/21/22 1451 06/22/22 0743 06/23/22 0552  AST 165* 174* 174*  ALT 71* 71* 67*  ALKPHOS 231* 230* 207*  BILITOT 4.7* 4.6* 3.6*  PROT 6.4* 6.5 5.8*  ALBUMIN 2.9* 2.9* 2.6*   Recent Labs  Lab 06/21/22 1451  LIPASE 35   No results for input(s): "AMMONIA" in the last 168 hours. CBC: Recent Labs  Lab 06/21/22 1007 06/22/22 0743 06/23/22 0552  WBC 13.6* 10.0 8.7  NEUTROABS 10.4*  --   --   HGB 14.2 13.8 12.3  HCT 40.1 39.0 35.2*  MCV 95.9 96.8 97.5  PLT 283 272 182   Cardiac Enzymes: No results for input(s): "CKTOTAL", "CKMB", "CKMBINDEX", "TROPONINI" in the last 168 hours. BNP: Invalid input(s): "POCBNP" CBG: No results for input(s): "GLUCAP" in the last 168 hours. D-Dimer No results for input(s): "DDIMER" in the last 72 hours. Hgb A1c No results for input(s): "HGBA1C" in the last 72 hours. Lipid Profile No results for input(s): "CHOL", "HDL", "LDLCALC", "TRIG", "CHOLHDL", "LDLDIRECT" in the last 72 hours. Thyroid function studies No results for input(s): "TSH", "T4TOTAL",  "T3FREE", "THYROIDAB" in the last 72 hours.  Invalid input(s): "FREET3" Anemia work up No results for input(s): "VITAMINB12", "FOLATE", "FERRITIN", "TIBC", "IRON", "RETICCTPCT" in the last 72 hours. Urinalysis    Component Value Date/Time   COLORURINE AMBER (A) 06/21/2022 1007   APPEARANCEUR CLOUDY (A) 06/21/2022 1007   APPEARANCEUR Clear 05/16/2021 1546   LABSPEC 1.025 06/21/2022 1007   PHURINE 5.0 06/21/2022 1007   GLUCOSEU NEGATIVE 06/21/2022 1007   HGBUR SMALL (A) 06/21/2022 1007   BILIRUBINUR MODERATE (A) 06/21/2022 1007   BILIRUBINUR Negative 05/16/2021 1546   KETONESUR 5 (A) 06/21/2022 1007   PROTEINUR 100 (A) 06/21/2022 1007   NITRITE NEGATIVE 06/21/2022 1007   LEUKOCYTESUR NEGATIVE 06/21/2022 1007   Sepsis Labs Recent Labs  Lab 06/21/22 1007 06/22/22 0743 06/23/22 0552  WBC 13.6* 10.0 8.7   Microbiology Recent Results (from the past 240 hour(s))  Resp panel by RT-PCR (RSV, Flu A&B, Covid) Anterior Nasal Swab     Status: None   Collection Time: 06/21/22 10:07 AM   Specimen: Anterior Nasal Swab  Result Value Ref Range Status   SARS Coronavirus 2 by RT PCR NEGATIVE NEGATIVE Final    Comment: (NOTE) SARS-CoV-2 target nucleic acids are NOT DETECTED.  The SARS-CoV-2 RNA is generally detectable in upper respiratory specimens during the acute phase of infection. The lowest concentration of SARS-CoV-2 viral copies this assay can detect is 138 copies/mL. A negative result does not preclude SARS-Cov-2 infection and should not be used as the sole basis for treatment or other patient management decisions. A negative result may occur with  improper specimen collection/handling, submission of specimen other than nasopharyngeal swab, presence of viral mutation(s) within the areas targeted by this assay, and inadequate number of viral copies(<138 copies/mL). A negative result must be combined with clinical observations, patient history, and epidemiological information. The  expected result is Negative.  Fact Sheet for Patients:  EntrepreneurPulse.com.au  Fact Sheet for Healthcare Providers:  IncredibleEmployment.be  This test is no t yet approved or cleared by the [place] and  has been authorized for detection and/or diagnosis of SARS-CoV-2 by FDA under an Emergency Use Authorization (EUA). This EUA will remain  in effect (meaning this test can be used) for the duration of the COVID-19 declaration under Section 564(b)(1) of the Act, 21 U.S.C.section 360bbb-3(b)(1), unless the authorization is terminated  or revoked sooner.       Influenza A by PCR NEGATIVE NEGATIVE Final   Influenza B by PCR NEGATIVE NEGATIVE Final    Comment: (NOTE) The Xpert Xpress SARS-CoV-2/FLU/RSV plus assay is intended as an aid in the diagnosis of influenza from Nasopharyngeal swab specimens and should not be used as a sole basis for treatment. Nasal washings and aspirates are unacceptable for Xpert Xpress SARS-CoV-2/FLU/RSV testing.  Fact Sheet for Patients: EntrepreneurPulse.com.au  Fact Sheet for Healthcare Providers: IncredibleEmployment.be  This test is not yet approved or cleared by the [place] FDA and has been authorized for detection and/or diagnosis of SARS-CoV-2 by FDA under an Emergency Use Authorization (EUA). This EUA will remain in effect (meaning this test can be used) for the duration of the COVID-19 declaration under Section 564(b)(1) of the Act, 21 U.S.C. section 360bbb-3(b)(1), unless the authorization is terminated or revoked.     Resp Syncytial Virus by PCR NEGATIVE NEGATIVE Final    Comment: (NOTE) Fact Sheet for Patients: EntrepreneurPulse.com.au  Fact Sheet for Healthcare Providers: IncredibleEmployment.be  This test is not yet approved or cleared by the [place] FDA and has been authorized for detection and/or  diagnosis of SARS-CoV-2 by FDA under an Emergency Use Authorization (EUA). This EUA will remain in effect (meaning this test can be used) for the duration of the COVID-19 declaration under Section 564(b)(1) of the Act, 21 U.S.C. section 360bbb-3(b)(1), unless the authorization is terminated or revoked.  Performed at [facility], [address] [place], [place] [address]   Gastrointestinal Panel by PCR , Stool     Status: None   Collection Time: 06/21/22 10:07 AM   Specimen: Stool  Result Value Ref Range Status   Campylobacter species NOT DETECTED NOT DETECTED Final   Plesimonas shigelloides NOT DETECTED NOT DETECTED Final   Salmonella species NOT DETECTED NOT DETECTED Final   Yersinia enterocolitica NOT DETECTED NOT DETECTED Final   Vibrio species NOT DETECTED NOT DETECTED Final   Vibrio cholerae NOT DETECTED NOT DETECTED Final   Enteroaggregative E coli (EAEC) NOT DETECTED NOT DETECTED Final   Enteropathogenic E coli (EPEC) NOT DETECTED NOT DETECTED Final   Enterotoxigenic E coli (ETEC) NOT DETECTED NOT DETECTED Final   Shiga like toxin producing E coli (STEC) NOT DETECTED NOT DETECTED Final   Shigella/Enteroinvasive E coli (EIEC) NOT DETECTED NOT DETECTED Final   Cryptosporidium NOT DETECTED NOT DETECTED Final   Cyclospora cayetanensis NOT DETECTED NOT DETECTED Final   Entamoeba histolytica NOT DETECTED NOT DETECTED Final   Giardia lamblia NOT DETECTED NOT DETECTED Final   Adenovirus F40/41 NOT DETECTED NOT DETECTED Final   Astrovirus NOT DETECTED NOT DETECTED Final   Norovirus GI/GII NOT DETECTED NOT DETECTED Final   Rotavirus A NOT DETECTED NOT DETECTED Final   Sapovirus (I, II, IV, and V) NOT DETECTED NOT DETECTED Final    Comment: Performed at [facility], [address] [place], [place] [address]  C Difficile Quick Screen w PCR reflex     Status: None   Collection Time: 06/21/22 10:07 AM   Specimen: Stool  Result Value Ref Range Status   C Diff  antigen NEGATIVE NEGATIVE Final  C Diff toxin NEGATIVE NEGATIVE Final   C Diff interpretation No C. difficile detected.  Final    Comment: Performed at [facility], [address] [place], [place] [address]     Time coordinating discharge: Over 30 minutes  SIGNED:   Duard Brady, MD  [facility] 06/24/2022, 1:37 PM Pager   If 7PM-7AM, please contact night-coverage

## 2022-06-27 DIAGNOSIS — G4733 Obstructive sleep apnea (adult) (pediatric): Secondary | ICD-10-CM | POA: Diagnosis not present

## 2022-07-01 NOTE — Patient Instructions (Signed)
Hypokalemia Hypokalemia means that the amount of potassium in the blood is lower than normal. Potassium is a mineral (electrolyte) that helps regulate the amount of fluid in the body. It also stimulates muscle tightening (contraction) and helps nerves work properly. Normally, most of the body's potassium is inside cells, and only a very small amount is in the blood. Because the amount in the blood is so small, minor changes to potassium levels in the blood can be life-threatening. What are the causes? This condition may be caused by: Antibiotic medicine. Diarrhea or vomiting. Taking too much of a medicine that helps you have a bowel movement (laxative) can cause diarrhea and lead to hypokalemia. Chronic kidney disease (CKD). Medicines that help the body get rid of excess fluid (diuretics). Eating disorders, such as anorexia or bulimia. Low magnesium levels in the body. Sweating a lot. What are the signs or symptoms? Symptoms of this condition include: Weakness. Constipation. Fatigue. Muscle cramps. Mental confusion. Skipped heartbeats or irregular heartbeat (palpitations). Tingling or numbness. How is this diagnosed? This condition is diagnosed with a blood test. How is this treated? This condition may be treated by: Taking potassium supplements. Adjusting the medicines that you take. Eating more foods that contain a lot of potassium. If your potassium level is very low, you may need to get potassium through an IV and be monitored in the hospital. Follow these instructions at home: Eating and drinking  Eat a healthy diet. A healthy diet includes fresh fruits and vegetables, whole grains, healthy fats, and lean proteins. If told, eat more foods that contain a lot of potassium. These include: Nuts, such as peanuts and pistachios. Seeds, such as sunflower seeds and pumpkin seeds. Peas, lentils, and lima beans. Whole grain and bran cereals and breads. Fresh fruits and vegetables,  such as apricots, avocado, bananas, cantaloupe, kiwi, oranges, tomatoes, asparagus, and potatoes. Juices, such as orange, tomato, and prune. Lean meats, including fish. Milk and milk products, such as yogurt. General instructions Take over-the-counter and prescription medicines only as told by your health care provider. This includes vitamins, natural food products, and supplements. Keep all follow-up visits. This is important. Contact a health care provider if: You have weakness that gets worse. You feel your heart pounding or racing. You vomit. You have diarrhea. You have diabetes and you have trouble keeping your blood sugar in your target range. Get help right away if: You have chest pain. You have shortness of breath. You have vomiting or diarrhea that lasts for more than 2 days. You faint. These symptoms may be an emergency. Get help right away. Call 911. Do not wait to see if the symptoms will go away. Do not drive yourself to the hospital. Summary Hypokalemia means that the amount of potassium in the blood is lower than normal. This condition is diagnosed with a blood test. Hypokalemia may be treated by taking potassium supplements, adjusting the medicines that you take, or eating more foods that are high in potassium. If your potassium level is very low, you may need to get potassium through an IV and be monitored in the hospital. This information is not intended to replace advice given to you by your health care provider. Make sure you discuss any questions you have with your health care provider. Document Revised: 12/09/2020 Document Reviewed: 12/09/2020 Elsevier Patient Education  2023 [employer]  

## 2022-07-05 ENCOUNTER — Encounter: Payer: Self-pay | Admitting: Nurse Practitioner

## 2022-07-05 ENCOUNTER — Ambulatory Visit: Payer: Medicaid Other | Admitting: Nurse Practitioner

## 2022-07-05 VITALS — BP 124/86 | HR 91 | Temp 98.1°F | Ht 62.99 in | Wt 202.2 lb

## 2022-07-05 DIAGNOSIS — K760 Fatty (change of) liver, not elsewhere classified: Secondary | ICD-10-CM

## 2022-07-05 DIAGNOSIS — R197 Diarrhea, unspecified: Secondary | ICD-10-CM

## 2022-07-05 DIAGNOSIS — I479 Paroxysmal tachycardia, unspecified: Secondary | ICD-10-CM | POA: Diagnosis not present

## 2022-07-05 DIAGNOSIS — B182 Chronic viral hepatitis C: Secondary | ICD-10-CM | POA: Diagnosis not present

## 2022-07-05 DIAGNOSIS — F1021 Alcohol dependence, in remission: Secondary | ICD-10-CM

## 2022-07-05 DIAGNOSIS — E876 Hypokalemia: Secondary | ICD-10-CM

## 2022-07-05 DIAGNOSIS — I10 Essential (primary) hypertension: Secondary | ICD-10-CM

## 2022-07-05 NOTE — Assessment & Plan Note (Signed)
Chronic, stable.  BP at goal in office today.  Recommend she monitor BP at least a few mornings a week at home and document.  DASH diet at home.  Continue current medication regimen and adjust as needed, may restart Metoprolol in upcoming weeks if BP remains stable.  Labs outpatient, as could not get in office today.

## 2022-07-05 NOTE — Progress Notes (Signed)
BP 124/86   Pulse 91   Temp 98.1 F (36.7 C) (Oral)   Ht 5' 2.99" (1.6 m)   Wt 202 lb 3.2 oz (91.7 kg)   SpO2 95%   BMI 35.83 kg/m    Subjective:    Patient ID: Katie Stanley, female    DOB: 1985-07-30, 37 y.o.   MRN: [id number]  HPI: Katie Stanley is a 37 y.o. female  Chief Complaint  Patient presents with   Hospitalization Follow-up    D/C's on 3/16, was in [place]. For 4 days    Transition of Care Hospital Follow up.  Admitted to hospital on 06/21/22 with abdominal pain and discharged on 06/24/22.  Was noted to have hypokalemia on labs, K+ on discharge was 3.2.  Continues to have pain since discharge, comes and goes.  Is having diarrhea 10-15 times a day, has taken Imodium which helps.  This morning stool was a little firmer, but after that has had diarrhea.  Currently has Metoprolol on hold as BP low in hospital.  Hepatomegaly noted on imaging.  History of Hep C that she reports healed self.  Saw Dr. Allen Norris in past.  CT imaging in hospital noted wall thickening in right lower quadrant.  Does not have gall bladder.  Still has appendix.  Denies any alcohol use since hospital.  Prior to hospital was drinking every day a 5th or more of liquor - whiskey.  Has drank heavily for 3-4 years.  In past has been to therapy for this.  Her family is very supportive and want her to cut back.  Was ordered Naltrexone by PCP in January 2024, but has not tried this yet.    "Recommendations for Outpatient Follow-up:  Follow up with PCP in 1-2 weeks. Please obtain BMP/CBC in one week. Advised to drink more fluids. Advised to refrain from drinking alcohol. Advised to take Norco as needed for severe pain.   Home Health: None. Equipment/Devices: None   Discharge Condition: Stable CODE STATUS:Full code Diet recommendation: Heart Healthy    Brief [facility] Course: This 37 years old female with PMH significant for EtOH abuse, tobacco use, depression/anxiety, substance abuse presented in  the ED with complaints of nausea,  vomiting and left upper quadrant abdominal pain for last 4 days.  She described abdominal pain as constant,  radiating towards the back associated with nausea and vomiting.  She continued to drink alcohol despite having abdominal pain.  Workup in the ED reveals sodium 131, potassium 2.0, chloride 79, bicarb 24, creatinine 1.12, calcium 7.5, anion gap 18 magnesium 1.2 troponin 25, CT abdomen and pelvis shows wall thickening and mucosal enhancement consistent with enteritis.  Patient was admitted for further evaluation.  Patient was continued on IV fluid resuscitation.  She was also continued on CIWA protocol.  Electrolytes were replaced.  Patient has made significant improvement,  She felt better.  Electrolytes has  improved.  Anion gap resolved.  Patient was counseled about quit alcohol.  She feels better and wants to be discharged.  Abdominal pain has significantly improved.  Patient is being discharged home.   Discharge Diagnoses:  Principal Problem:   Hypokalemia due to excessive gastrointestinal loss of potassium Active Problems:   Anxiety   Chronic hepatitis C virus infection (HCC)   Depression, recurrent (HCC)   Gastroesophageal reflux disease   Morbid obesity (HCC)   Hepatic steatosis   Nausea, Vomiting, LUQ pain: Likely secondary to enteritis: Patient presented with nausea, vomiting and left upper quadrant  abdominal pain for 4 days. CT chest /abdomen showed findings consistent with enteritis. Could be secondary to EtOH use. Continue IV fluid resuscitation, monitor electrolytes. Continue adequate pain control with hydrocodone as needed. Continue IV Zofran as needed. Abdominal pain has improved.  Nausea and vomiting has improved.   Severe hypokalemia: Likely secondary to ongoing nausea and vomiting leading to dehydration. Replaced.  Continue to monitor.   Hypomagnesemia/hyponatremia: Replaced.  Continue to monitor.   Elevated liver  enzymes: Could be secondary to EtOH use. Monitor LFTs.   Elevated troponin: This could be secondary to excessive EtOH use. Patient denies any chest pain, EKG sinus tachycardia, but no ST-T wave changes. Continue to trend troponins 25, 24, 19 , likely demand ischemia   Acute hypoxic respiratory failure could be due to undiagnosed COPD Patient does not use oxygen at baseline.   She presented with hypoxia requiring 2 L of supplemental oxygen. CT chest ruled out pulmonary embolism. She might be having undiagnosed COPD given chronic tobacco use. Continue supplemental oxygen and wean as tolerated. She is successfully weaned down to room air.   EtOH abuse: Patient continues to drink alcohol despite having abdominal pain. Continue CIWA protocol and monitor electrolytes.   Anxiety/depression: Resume antidepressant medication.   History of substance abuse Urine drug screen positive for cannabinoids and benzos."  Hospital/Facility: [facility] D/C Physician: Dr. Shelly Coss D/C Date: 06/24/22  Records Requested: 07/05/22 Records Received: 07/05/22 Records Reviewed: 07/05/22  Diagnoses on Discharge: Hypokalemia due to excessive gastrointestinal loss of potassium  Date of interactive Contact within 48 hours of discharge:  Contact was through: none present on chart  Date of 7 day or 14 day face-to-face visit:    within 14 days  Outpatient Encounter Medications as of 07/05/2022  Medication Sig   citalopram (CELEXA) 20 MG tablet TAKE 1 TABLET(20 MG) BY MOUTH DAILY   folic acid (FOLVITE) 1 MG tablet Take 1 tablet (1 mg total) by mouth daily.   lidocaine (LIDODERM) 5 % Place 1 patch onto the skin daily. Remove & Discard patch within 12 hours or as directed by MD   lisinopril (ZESTRIL) 10 MG tablet Take 1 tablet (10 mg total) by mouth daily.   Naphazoline HCl (CLEAR EYES OP) Place 1 drop into both eyes daily as needed (itching).   pantoprazole (PROTONIX) 40 MG tablet Take 1 tablet (40 mg total) by mouth  daily.   thiamine (VITAMIN B-1) 100 MG tablet Take 1 tablet (100 mg total) by mouth daily.   metoprolol succinate (TOPROL-XL) 25 MG 24 hr tablet Take 25 mg by mouth daily. (Patient not taking: Reported on 07/05/2022)   [DISCONTINUED] albuterol (VENTOLIN HFA) 108 (90 Base) MCG/ACT inhaler INHALE 2 PUFFS INTO THE LUNGS EVERY 6 HOURS AS NEEDED FOR WHEEZING OR SHORTNESS OF BREATH (Patient not taking: Reported on 07/05/2022)   [DISCONTINUED] lidocaine (XYLOCAINE) 2 % solution Use as directed 15 mLs in the mouth or throat every 3 (three) hours as needed for mouth pain (swish and spit).   [DISCONTINUED] saccharomyces boulardii (FLORASTOR) 250 MG capsule Take 1 capsule (250 mg total) by mouth 2 (two) times daily. (Patient not taking: Reported on 07/05/2022)   No facility-administered encounter medications on file as of 07/05/2022.    Diagnostic Tests Reviewed/Disposition: Recent labs and imaging reviewed    Component Value Date/Time   NA 134 (L) 06/24/2022 0626   NA 140 01/06/2016 1017   K 3.2 (L) 06/24/2022 0626   CL 106 06/24/2022 0626   CO2 20 (L) 06/24/2022 [id number]  GLUCOSE 124 (H) 06/24/2022 0626   BUN <5 (L) 06/24/2022 0626   BUN 13 01/06/2016 1017   CREATININE 0.63 06/24/2022 0626   CALCIUM 6.8 (L) 06/24/2022 0626   PROT 5.8 (L) 06/23/2022 0552   PROT 7.0 01/06/2016 1017   ALBUMIN 2.6 (L) 06/23/2022 0552   ALBUMIN 4.2 01/06/2016 1017   AST 174 (H) 06/23/2022 0552   ALT 67 (H) 06/23/2022 0552   ALKPHOS 207 (H) 06/23/2022 0552   BILITOT 3.6 (H) 06/23/2022 0552   BILITOT 0.2 01/06/2016 1017   GFRNONAA >60 06/24/2022 0626   GFRAA >60 12/10/2019 1417    Consults: none  Discharge Instructions: Follow-up with PCP  Disease/illness Education: Reviewed with patient in office today  Home Health/Community Services Discussions/Referrals: None  Establishment or re-establishment of referral orders for community resources: None  Discussion with other health care providers: Reviewed on  chart  Assessment and Support of treatment regimen adherence: Reviewed with patient in office today  Appointments Coordinated with: Reviewed with patient in office today  Education for self-management, independent living, and ADLs:  Reviewed with patient in office today  Relevant past medical, surgical, family and social history reviewed and updated as indicated. Interim medical history since our last visit reviewed. Allergies and medications reviewed and updated.  Review of Systems  Constitutional:  Negative for activity change, appetite change, diaphoresis, fatigue and fever.  Respiratory:  Negative for cough, chest tightness and shortness of breath.   Cardiovascular:  Negative for chest pain, palpitations and leg swelling.  Gastrointestinal:  Positive for abdominal pain and diarrhea. Negative for abdominal distention, blood in stool, constipation, nausea, rectal pain and vomiting.  Endocrine: Negative for cold intolerance, heat intolerance, polydipsia, polyphagia and polyuria.  Neurological: Negative.   Psychiatric/Behavioral: Negative.      Per HPI unless specifically indicated above     Objective:    BP 124/86   Pulse 91   Temp 98.1 F (36.7 C) (Oral)   Ht 5' 2.99" (1.6 m)   Wt 202 lb 3.2 oz (91.7 kg)   SpO2 95%   BMI 35.83 kg/m   Wt Readings from Last 3 Encounters:  07/05/22 202 lb 3.2 oz (91.7 kg)  06/24/22 215 lb 13.3 oz (97.9 kg)  04/28/22 202 lb 6.4 oz (91.8 kg)    Physical Exam Vitals and nursing note reviewed.  Constitutional:      General: She is awake. She is not in acute distress.    Appearance: She is well-developed and well-groomed. She is obese. She is not ill-appearing or toxic-appearing.  HENT:     Head: Normocephalic.     Right Ear: Hearing and external ear normal.     Left Ear: Hearing and external ear normal.  Eyes:     General: Lids are normal. No scleral icterus.       Right eye: No discharge.        Left eye: No discharge.      Conjunctiva/sclera: Conjunctivae normal.     Pupils: Pupils are equal, round, and reactive to light.  Neck:     Thyroid: No thyromegaly.     Vascular: No carotid bruit.  Cardiovascular:     Rate and Rhythm: Normal rate and regular rhythm.     Heart sounds: Normal heart sounds. No murmur heard.    No gallop.  Pulmonary:     Effort: Pulmonary effort is normal. No accessory muscle usage or respiratory distress.     Breath sounds: Normal breath sounds.  Abdominal:  General: Bowel sounds are normal. There is no distension or abdominal bruit.     Palpations: Abdomen is soft. There is hepatomegaly. There is no fluid wave.     Tenderness: There is no abdominal tenderness. There is no right CVA tenderness or left CVA tenderness.  Musculoskeletal:     Cervical back: Normal range of motion and neck supple.     Right lower leg: No edema.     Left lower leg: No edema.  Lymphadenopathy:     Head:     Right side of head: No submental, submandibular, tonsillar, preauricular or posterior auricular adenopathy.     Left side of head: No submental, submandibular, tonsillar, preauricular or posterior auricular adenopathy.     Cervical: No cervical adenopathy.  Skin:    General: Skin is warm and dry.  Neurological:     Mental Status: She is alert and oriented to person, place, and time.     Deep Tendon Reflexes: Reflexes are normal and symmetric.     Reflex Scores:      Brachioradialis reflexes are 2+ on the right side and 2+ on the left side.      Patellar reflexes are 2+ on the right side and 2+ on the left side. Psychiatric:        Attention and Perception: Attention normal.        Mood and Affect: Mood normal.        Speech: Speech normal.        Behavior: Behavior normal. Behavior is cooperative.        Thought Content: Thought content normal.     Results for orders placed or performed during the hospital encounter of 06/21/22  Resp panel by RT-PCR (RSV, Flu A&B, Covid) Anterior Nasal  Swab   Specimen: Anterior Nasal Swab  Result Value Ref Range   SARS Coronavirus 2 by RT PCR NEGATIVE NEGATIVE   Influenza A by PCR NEGATIVE NEGATIVE   Influenza B by PCR NEGATIVE NEGATIVE   Resp Syncytial Virus by PCR NEGATIVE NEGATIVE  Gastrointestinal Panel by PCR , Stool   Specimen: Stool  Result Value Ref Range   Campylobacter species NOT DETECTED NOT DETECTED   Plesimonas shigelloides NOT DETECTED NOT DETECTED   Salmonella species NOT DETECTED NOT DETECTED   Yersinia enterocolitica NOT DETECTED NOT DETECTED   Vibrio species NOT DETECTED NOT DETECTED   Vibrio cholerae NOT DETECTED NOT DETECTED   Enteroaggregative E coli (EAEC) NOT DETECTED NOT DETECTED   Enteropathogenic E coli (EPEC) NOT DETECTED NOT DETECTED   Enterotoxigenic E coli (ETEC) NOT DETECTED NOT DETECTED   Shiga like toxin producing E coli (STEC) NOT DETECTED NOT DETECTED   Shigella/Enteroinvasive E coli (EIEC) NOT DETECTED NOT DETECTED   Cryptosporidium NOT DETECTED NOT DETECTED   Cyclospora cayetanensis NOT DETECTED NOT DETECTED   Entamoeba histolytica NOT DETECTED NOT DETECTED   Giardia lamblia NOT DETECTED NOT DETECTED   Adenovirus F40/41 NOT DETECTED NOT DETECTED   Astrovirus NOT DETECTED NOT DETECTED   Norovirus GI/GII NOT DETECTED NOT DETECTED   Rotavirus A NOT DETECTED NOT DETECTED   Sapovirus (I, II, IV, and V) NOT DETECTED NOT DETECTED  C Difficile Quick Screen w PCR reflex   Specimen: Stool  Result Value Ref Range   C Diff antigen NEGATIVE NEGATIVE   C Diff toxin NEGATIVE NEGATIVE   C Diff interpretation No C. difficile detected.   CBC with Differential  Result Value Ref Range   WBC 13.6 (H) 4.0 - 10.5 K/uL  RBC 4.18 3.87 - 5.11 MIL/uL   Hemoglobin 14.2 12.0 - 15.0 g/dL   HCT 40.1 36.0 - 46.0 %   MCV 95.9 80.0 - 100.0 fL   MCH 34.0 26.0 - 34.0 pg   MCHC 35.4 30.0 - 36.0 g/dL   RDW 14.6 11.5 - 15.5 %   Platelets 283 150 - 400 K/uL   nRBC 0.2 0.0 - 0.2 %   Neutrophils Relative % 77 %    Neutro Abs 10.4 (H) 1.7 - 7.7 K/uL   Lymphocytes Relative 15 %   Lymphs Abs 2.1 0.7 - 4.0 K/uL   Monocytes Relative 7 %   Monocytes Absolute 1.0 0.1 - 1.0 K/uL   Eosinophils Relative 0 %   Eosinophils Absolute 0.0 0.0 - 0.5 K/uL   Basophils Relative 1 %   Basophils Absolute 0.1 0.0 - 0.1 K/uL   Immature Granulocytes 0 %   Abs Immature Granulocytes 0.06 0.00 - 0.07 K/uL  Ethanol  Result Value Ref Range   Alcohol, Ethyl (B) <10 <10 mg/dL  Urinalysis, Routine w reflex microscopic -Urine, Clean Catch  Result Value Ref Range   Color, Urine AMBER (A) YELLOW   APPearance CLOUDY (A) CLEAR   Specific Gravity, Urine 1.025 1.005 - 1.030   pH 5.0 5.0 - 8.0   Glucose, UA NEGATIVE NEGATIVE mg/dL   Hgb urine dipstick SMALL (A) NEGATIVE   Bilirubin Urine MODERATE (A) NEGATIVE   Ketones, ur 5 (A) NEGATIVE mg/dL   Protein, ur 100 (A) NEGATIVE mg/dL   Nitrite NEGATIVE NEGATIVE   Leukocytes,Ua NEGATIVE NEGATIVE   RBC / HPF 0-5 0 - 5 RBC/hpf   WBC, UA 6-10 0 - 5 WBC/hpf   Bacteria, UA FEW (A) NONE SEEN   Squamous Epithelial / HPF >50 0 - 5 /HPF   Mucus PRESENT    Hyaline Casts, UA PRESENT   Urine Drug Screen, Qualitative ([facility] only)  Result Value Ref Range   Tricyclic, Ur Screen NONE DETECTED NONE DETECTED   Amphetamines, Ur Screen NONE DETECTED NONE DETECTED   MDMA (Ecstasy)Ur Screen NONE DETECTED NONE DETECTED   Cocaine Metabolite,Ur [place] NONE DETECTED NONE DETECTED   Opiate, Ur Screen NONE DETECTED NONE DETECTED   Phencyclidine (PCP) Ur S NONE DETECTED NONE DETECTED   Cannabinoid 50 Ng, Ur [place] POSITIVE (A) NONE DETECTED   Barbiturates, Ur Screen NONE DETECTED NONE DETECTED   Benzodiazepine, Ur Scrn POSITIVE (A) NONE DETECTED   Methadone Scn, Ur NONE DETECTED NONE DETECTED  Pregnancy, urine  Result Value Ref Range   Preg Test, Ur NEGATIVE NEGATIVE  Comprehensive metabolic panel  Result Value Ref Range   Sodium 131 (L) 135 - 145 mmol/L   Potassium <2.0 (LL) 3.5 - 5.1 mmol/L   Chloride 79  (L) 98 - 111 mmol/L   CO2 34 (H) 22 - 32 mmol/L   Glucose, Bld 98 70 - 99 mg/dL   BUN <5 (L) 6 - 20 mg/dL   Creatinine, Ser 1.12 (H) 0.44 - 1.00 mg/dL   Calcium 7.5 (L) 8.9 - 10.3 mg/dL   Total Protein 6.4 (L) 6.5 - 8.1 g/dL   Albumin 2.9 (L) 3.5 - 5.0 g/dL   AST 165 (H) 15 - 41 U/L   ALT 71 (H) 0 - 44 U/L   Alkaline Phosphatase 231 (H) 38 - 126 U/L   Total Bilirubin 4.7 (H) 0.3 - 1.2 mg/dL   GFR, Estimated >60 >60 mL/min   Anion gap 18 (H) 5 - 15  hCG, quantitative, pregnancy  Result Value Ref Range   hCG, Beta Chain, Quant, S <1 <5 mIU/mL  Lipase, blood  Result Value Ref Range   Lipase 35 11 - 51 U/L  Magnesium  Result Value Ref Range   Magnesium 1.2 (L) 1.7 - 2.4 mg/dL  HIV Antibody (routine testing w rflx)  Result Value Ref Range   HIV Screen 4th Generation wRfx Non Reactive Non Reactive  Comprehensive metabolic panel  Result Value Ref Range   Sodium 132 (L) 135 - 145 mmol/L   Potassium <2.0 (LL) 3.5 - 5.1 mmol/L   Chloride 90 (L) 98 - 111 mmol/L   CO2 29 22 - 32 mmol/L   Glucose, Bld 109 (H) 70 - 99 mg/dL   BUN <5 (L) 6 - 20 mg/dL   Creatinine, Ser 0.80 0.44 - 1.00 mg/dL   Calcium 7.2 (L) 8.9 - 10.3 mg/dL   Total Protein 6.5 6.5 - 8.1 g/dL   Albumin 2.9 (L) 3.5 - 5.0 g/dL   AST 174 (H) 15 - 41 U/L   ALT 71 (H) 0 - 44 U/L   Alkaline Phosphatase 230 (H) 38 - 126 U/L   Total Bilirubin 4.6 (H) 0.3 - 1.2 mg/dL   GFR, Estimated >60 >60 mL/min   Anion gap 13 5 - 15  CBC  Result Value Ref Range   WBC 10.0 4.0 - 10.5 K/uL   RBC 4.03 3.87 - 5.11 MIL/uL   Hemoglobin 13.8 12.0 - 15.0 g/dL   HCT 39.0 36.0 - 46.0 %   MCV 96.8 80.0 - 100.0 fL   MCH 34.2 (H) 26.0 - 34.0 pg   MCHC 35.4 30.0 - 36.0 g/dL   RDW 14.5 11.5 - 15.5 %   Platelets 272 150 - 400 K/uL   nRBC 0.0 0.0 - 0.2 %  Magnesium  Result Value Ref Range   Magnesium 1.8 1.7 - 2.4 mg/dL  Phosphorus  Result Value Ref Range   Phosphorus 1.3 (L) 2.5 - 4.6 mg/dL  Potassium  Result Value Ref Range   Potassium  2.3 (LL) 3.5 - 5.1 mmol/L  CBC  Result Value Ref Range   WBC 8.7 4.0 - 10.5 K/uL   RBC 3.61 (L) 3.87 - 5.11 MIL/uL   Hemoglobin 12.3 12.0 - 15.0 g/dL   HCT 35.2 (L) 36.0 - 46.0 %   MCV 97.5 80.0 - 100.0 fL   MCH 34.1 (H) 26.0 - 34.0 pg   MCHC 34.9 30.0 - 36.0 g/dL   RDW 14.7 11.5 - 15.5 %   Platelets 182 150 - 400 K/uL   nRBC 0.0 0.0 - 0.2 %  Comprehensive metabolic panel  Result Value Ref Range   Sodium 136 135 - 145 mmol/L   Potassium 2.5 (LL) 3.5 - 5.1 mmol/L   Chloride 95 (L) 98 - 111 mmol/L   CO2 25 22 - 32 mmol/L   Glucose, Bld 93 70 - 99 mg/dL   BUN <5 (L) 6 - 20 mg/dL   Creatinine, Ser 0.64 0.44 - 1.00 mg/dL   Calcium 7.2 (L) 8.9 - 10.3 mg/dL   Total Protein 5.8 (L) 6.5 - 8.1 g/dL   Albumin 2.6 (L) 3.5 - 5.0 g/dL   AST 174 (H) 15 - 41 U/L   ALT 67 (H) 0 - 44 U/L   Alkaline Phosphatase 207 (H) 38 - 126 U/L   Total Bilirubin 3.6 (H) 0.3 - 1.2 mg/dL   GFR, Estimated >60 >60 mL/min   Anion gap 16 (H) 5 - 15  Magnesium  Result Value Ref Range   Magnesium 1.6 (L) 1.7 - 2.4 mg/dL  Phosphorus  Result Value Ref Range   Phosphorus 2.1 (L) 2.5 - 4.6 mg/dL  Potassium  Result Value Ref Range   Potassium 3.4 (L) 3.5 - 5.1 mmol/L  Basic metabolic panel  Result Value Ref Range   Sodium 134 (L) 135 - 145 mmol/L   Potassium 3.2 (L) 3.5 - 5.1 mmol/L   Chloride 106 98 - 111 mmol/L   CO2 20 (L) 22 - 32 mmol/L   Glucose, Bld 124 (H) 70 - 99 mg/dL   BUN <5 (L) 6 - 20 mg/dL   Creatinine, Ser 0.63 0.44 - 1.00 mg/dL   Calcium 6.8 (L) 8.9 - 10.3 mg/dL   GFR, Estimated >60 >60 mL/min   Anion gap 8 5 - 15  Troponin I (High Sensitivity)  Result Value Ref Range   Troponin I (High Sensitivity) 25 (H) <18 ng/L  Troponin I (High Sensitivity)  Result Value Ref Range   Troponin I (High Sensitivity) 24 (H) <18 ng/L  Troponin I (High Sensitivity)  Result Value Ref Range   Troponin I (High Sensitivity) 19 (H) <18 ng/L      Assessment & Plan:   Problem List Items Addressed This Visit        Cardiovascular and Mediastinum   Paroxysmal tachycardia (HCC)    Metoprolol on hold as low BP in hospital. Consider restart next visit if BP remains stable.  Labs outpatient, as could not get in office today.  Return in 4 weeks.      Relevant Medications   metoprolol succinate (TOPROL-XL) 25 MG 24 hr tablet   Primary hypertension    Chronic, stable.  BP at goal in office today.  Recommend she monitor BP at least a few mornings a week at home and document.  DASH diet at home.  Continue current medication regimen and adjust as needed, may restart Metoprolol in upcoming weeks if BP remains stable.  Labs outpatient, as could not get in office today.         Relevant Medications   metoprolol succinate (TOPROL-XL) 25 MG 24 hr tablet     Digestive   Chronic hepatitis C virus infection ([place]) - Primary    Spontaneously cleared in past. Monitor closely.      Relevant Orders   Ambulatory referral to Gastroenterology   Hepatic steatosis    Ongoing issue, continues to drink heavily.  Highly recommend she cut back on alcohol use.  Will get back into GI for further recommendations due to recent abdominal pain, diarrhea, and elevation in LFTs, trend up.          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  Recommend she start Naltrexone as ordered by PCP and look into local AA to obtain a sponsor to assist with working towards cessation.      Relevant Orders   Ambulatory referral to Gastroenterology   TSH   Phosphorus   Magnesium   CBC with Differential/Platelet   Hypokalemia due to excessive gastrointestinal loss of potassium    Continue supplement at home, recheck level today.  May take Imodium as needed for episodes of diarrhea.  Labs outpatient, as could not get in office today.        Relevant Orders   Comprehensive metabolic panel   Morbid obesity (HCC)    BMI 35.83.  Recommended eating smaller  high protein, low  fat meals more frequently and exercising 30 mins a day 5 times a week with a goal of 10-15lb weight loss in the next 3 months. Patient voiced their understanding and motivation to adhere to these recommendations.       Other Visit Diagnoses     Diarrhea, unspecified type       Refer to hepatic steatosis plan of care. Labs outpatient, as could not get in office today.   Relevant Orders   Ambulatory referral to Gastroenterology   Cdiff NAA+O+P+Stool Culture        Follow up plan: Return in about 4 weeks (around 08/02/2022) for Abdominal Pain and Fatty Liver.

## 2022-07-05 NOTE — Assessment & Plan Note (Signed)
Continue supplement at home, recheck level today.  May take Imodium as needed for episodes of diarrhea.  Labs outpatient, as could not get in office today.

## 2022-07-05 NOTE — Assessment & Plan Note (Signed)
Has not had a drink since leaving hospital.  Recommend she start Naltrexone as ordered by PCP and look into local AA to obtain a sponsor to assist with working towards cessation.

## 2022-07-05 NOTE — Assessment & Plan Note (Addendum)
Ongoing issue, continues to drink heavily.  Highly recommend she cut back on alcohol use.  Will get back into GI for further recommendations due to recent abdominal pain, diarrhea, and elevation in LFTs, trend up.

## 2022-07-05 NOTE — Assessment & Plan Note (Signed)
Metoprolol on hold as low BP in hospital. Consider restart next visit if BP remains stable.  Labs outpatient, as could not get in office today.  Return in 4 weeks.

## 2022-07-05 NOTE — Assessment & Plan Note (Signed)
Spontaneously cleared in past. Monitor closely.

## 2022-07-05 NOTE — Assessment & Plan Note (Signed)
BMI 35.83.  Recommended eating smaller high protein, low fat meals more frequently and exercising 30 mins a day 5 times a week with a goal of 10-15lb weight loss in the next 3 months. Patient voiced their understanding and motivation to adhere to these recommendations.

## 2022-07-06 DIAGNOSIS — R197 Diarrhea, unspecified: Secondary | ICD-10-CM | POA: Diagnosis not present

## 2022-07-11 ENCOUNTER — Encounter: Payer: Self-pay | Admitting: Nurse Practitioner

## 2022-07-11 ENCOUNTER — Other Ambulatory Visit: Payer: Self-pay | Admitting: Nurse Practitioner

## 2022-07-11 ENCOUNTER — Other Ambulatory Visit
Admission: RE | Admit: 2022-07-11 | Discharge: 2022-07-11 | Disposition: A | Discharge status: Home / Self Care | Payer: Medicaid Other | Source: Ambulatory Visit | Attending: Nurse Practitioner | Admitting: Nurse Practitioner

## 2022-07-11 DIAGNOSIS — E876 Hypokalemia: Secondary | ICD-10-CM | POA: Diagnosis present

## 2022-07-11 DIAGNOSIS — F1021 Alcohol dependence, in remission: Secondary | ICD-10-CM | POA: Diagnosis not present

## 2022-07-11 LAB — COMPREHENSIVE METABOLIC PANEL
ALT: 69 U/L — ABNORMAL HIGH (ref 0–44)
AST: 131 U/L — ABNORMAL HIGH (ref 15–41)
Albumin: 2.9 g/dL — ABNORMAL LOW (ref 3.5–5.0)
Alkaline Phosphatase: 163 U/L — ABNORMAL HIGH (ref 38–126)
Anion gap: 14 (ref 5–15)
BUN: <5 mg/dL — ABNORMAL LOW (ref 6–20)
CO2: 24 mmol/L (ref 22–32)
Calcium: 8.9 mg/dL (ref 8.9–10.3)
Chloride: 103 mmol/L (ref 98–111)
Creatinine, Ser: 0.58 mg/dL (ref 0.44–1.00)
GFR, Estimated: >60 mL/min (ref >60)
Glucose, Bld: 89 mg/dL (ref 70–99)
Potassium: 3.1 mmol/L — ABNORMAL LOW (ref 3.5–5.1)
Sodium: 141 mmol/L (ref 135–145)
Total Bilirubin: 1.5 mg/dL — ABNORMAL HIGH (ref 0.3–1.2)
Total Protein: 6.5 g/dL (ref 6.5–8.1)

## 2022-07-11 LAB — CBC WITH DIFFERENTIAL/PLATELET
Abs Immature Granulocytes: 0.05 10*3/uL (ref 0.00–0.07)
Basophils Absolute: 0.1 10*3/uL (ref 0.0–0.1)
Basophils Relative: 1 %
Eosinophils Absolute: 0.3 10*3/uL (ref 0.0–0.5)
Eosinophils Relative: 2 %
HCT: 41.3 % (ref 36.0–46.0)
Hemoglobin: 13.9 g/dL (ref 12.0–15.0)
Immature Granulocytes: 0 %
Lymphocytes Relative: 19 %
Lymphs Abs: 2.6 10*3/uL (ref 0.7–4.0)
MCH: 33.1 pg (ref 26.0–34.0)
MCHC: 33.7 g/dL (ref 30.0–36.0)
MCV: 98.3 fL (ref 80.0–100.0)
Monocytes Absolute: 0.8 10*3/uL (ref 0.1–1.0)
Monocytes Relative: 5 %
Neutro Abs: 10.3 10*3/uL — ABNORMAL HIGH (ref 1.7–7.7)
Neutrophils Relative %: 73 %
Platelets: UNDETERMINED 10*3/uL (ref 150–400)
RBC: 4.2 MIL/uL (ref 3.87–5.11)
RDW: 14.3 % (ref 11.5–15.5)
Smear Review: UNDETERMINED
WBC: 14 10*3/uL — ABNORMAL HIGH (ref 4.0–10.5)
nRBC: 0 % (ref 0.0–0.2)

## 2022-07-11 LAB — CDIFF NAA+O+P+STOOL CULTURE
E coli, Shiga toxin Assay: NEGATIVE
Toxigenic C. Difficile by PCR: NEGATIVE

## 2022-07-11 LAB — MAGNESIUM: Magnesium: 1.6 mg/dL — ABNORMAL LOW (ref 1.7–2.4)

## 2022-07-11 LAB — PHOSPHORUS: Phosphorus: 3.5 mg/dL (ref 2.5–4.6)

## 2022-07-11 LAB — TSH: TSH: 1.833 u[IU]/mL (ref 0.350–4.500)

## 2022-07-11 MED ORDER — POTASSIUM CHLORIDE CRYS ER 20 MEQ PO TBCR: 20.0000 meq | EXTENDED_RELEASE_TABLET | Freq: Every day | ORAL | 0 refills | Status: DC

## 2022-07-11 MED ORDER — MAGNESIUM 250 MG PO TABS: 250.0000 mg | ORAL_TABLET | Freq: Two times a day (BID) | ORAL | 0 refills | Status: DC

## 2022-07-11 NOTE — Addendum Note (Signed)
Addended by: Antonieta Iba C on: 07/11/2022 02:10 PM   Modules accepted: Orders

## 2022-07-11 NOTE — Progress Notes (Signed)
Contacted via [facility] -- please check to see she obtained message by calling her, thank you.  Also alert her I do want her to ensure attends follow-up visit on 24th with Dr. Wynetta Emery to recheck labs:) Good afternoon Jasmon, your labs have returned: - CBC is showing some elevation in white blood cell count and neutrophils which we at times see with infection.  Your stool culture was negative.  Have you had an urinary symptoms recently?  Any cough or congestion? Please let me know as we may need to delve further into this as WBC has some toxic granulation which we see with infection or inflammation. - Magnesium remains low, I have sent in a supplement for you to take twice a day for this to help improve levels and we can reduce on next visit if levels improved. - Potassium level remains low, I sent in supplement for you to take for this and we will recheck next visit. - Kidney function normal, but liver function (AST and ALT) remains elevated, although trending down.  Please ensure to avoid alcohol use and we will recheck next visit. - Remainder of labs stable.  Any questions? Keep being stellar!!  Thank you for allowing me to participate in your care.  I appreciate you. Kindest regards, Marra Fraga

## 2022-07-11 NOTE — Telephone Encounter (Signed)
Requested Prescriptions  Refused Prescriptions Disp Refills   potassium chloride SA (KLOR-CON M) 20 MEQ tablet [Pharmacy Med Name: POTASSIUM CL 20MEQ ER TABLETS] 90 tablet     Sig: TAKE 1 TABLET(20 MEQ) BY MOUTH DAILY     Endocrinology:  Minerals - Potassium Supplementation Failed - 07/11/2022  5:24 PM      Failed - K in normal range and within 360 days    Potassium  Date Value Ref Range Status  07/11/2022 3.1 (L) 3.5 - 5.1 mmol/L Final         Passed - Cr in normal range and within 360 days    Creatinine, Ser  Date Value Ref Range Status  07/11/2022 0.58 0.44 - 1.00 mg/dL Final         Passed - Valid encounter within last 12 months    Recent Outpatient Visits           6 days ago Chronic hepatitis C without hepatic coma ([place])   [place], Henrine Screws T, NP   2 months ago Acute non-recurrent maxillary sinusitis   [facility] [place], Megan P, DO   2 months ago 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 [facility])   [place], Megan P, DO   3 months ago Acute non-recurrent maxillary sinusitis   [place], DO   3 months ago Routine general medical examination at a health care facility   [place], Barb Merino, DO       Future Appointments             In 3 weeks Wynetta Emery, Barb Merino, DO [place], [place]   In 2 months Lucilla Lame, [facility]

## 2022-07-11 NOTE — Progress Notes (Signed)
Contacted via MyChart   Good morning Katie Stanley, your stool sample is overall negative:)

## 2022-07-12 ENCOUNTER — Other Ambulatory Visit: Payer: Self-pay | Admitting: Nurse Practitioner

## 2022-07-12 DIAGNOSIS — G4733 Obstructive sleep apnea (adult) (pediatric): Secondary | ICD-10-CM | POA: Diagnosis not present

## 2022-07-12 DIAGNOSIS — D72820 Lymphocytosis (symptomatic): Secondary | ICD-10-CM

## 2022-07-12 NOTE — Telephone Encounter (Signed)
Pt was called and scheduled for labs only

## 2022-07-13 ENCOUNTER — Other Ambulatory Visit: Payer: Medicaid Other

## 2022-07-13 DIAGNOSIS — D72820 Lymphocytosis (symptomatic): Secondary | ICD-10-CM

## 2022-07-13 LAB — MICROSCOPIC EXAMINATION
Bacteria, UA: NONE SEEN
RBC, Urine: NONE SEEN /hpf (ref 0–2)

## 2022-07-13 LAB — URINALYSIS, ROUTINE W REFLEX MICROSCOPIC
Glucose, UA: NEGATIVE
Ketones, UA: NEGATIVE
Leukocytes,UA: NEGATIVE
Nitrite, UA: NEGATIVE
RBC, UA: NEGATIVE
Specific Gravity, UA: >1.03 — ABNORMAL HIGH (ref 1.005–1.030)
Urobilinogen, Ur: 0.2 mg/dL (ref 0.2–1.0)
pH, UA: 5 (ref 5.0–7.5)

## 2022-08-02 ENCOUNTER — Ambulatory Visit: Payer: Medicaid Other | Admitting: Family Medicine

## 2022-08-02 ENCOUNTER — Encounter: Payer: Self-pay | Admitting: Family Medicine

## 2022-08-02 VITALS — BP 150/101 | HR 111 | Temp 98.9°F | Ht 62.99 in | Wt 201.6 lb

## 2022-08-02 DIAGNOSIS — F1010 Alcohol abuse, uncomplicated: Secondary | ICD-10-CM

## 2022-08-02 DIAGNOSIS — R1013 Epigastric pain: Secondary | ICD-10-CM | POA: Diagnosis not present

## 2022-08-02 MED ORDER — SUCRALFATE 1 GM/10ML PO SUSP: 1.0000 g | Freq: Three times a day (TID) | ORAL | 0 refills | Status: DC

## 2022-08-02 MED ORDER — PANTOPRAZOLE SODIUM 40 MG PO TBEC: 80.0000 mg | DELAYED_RELEASE_TABLET | Freq: Every day | ORAL | 1 refills | Status: DC

## 2022-08-02 NOTE — Progress Notes (Signed)
BP (!) 150/101 (BP Location: Left Arm, Cuff Size: Normal)   Pulse (!) 111   Temp 98.9 F (37.2 C) (Oral)   Ht 5' 2.99" (1.6 m)   Wt 201 lb 9.6 oz (91.4 kg)   SpO2 95%   BMI 35.72 kg/m    Subjective:    Patient ID: Katie Stanley, female    DOB: November 27, 1985, 37 y.o.   MRN: [id number]  HPI: Katie Stanley is a 37 y.o. female  Chief Complaint  Patient presents with   Abdominal Pain    Patient says she is still feeling about the same. Patient says the discomfort and pain is not getting any better. Patient says it feels like a stabbing pain in her mid abdominal area and it last a few seconds and goes away. Patient says she is still experiencing diarrhea.    ABDOMINAL PAIN  Duration: month and a half Onset: sudden Severity: severe Quality: sharp Location:  epigastric and LUQ  Episode duration: lasts a few seconds then goes away, then comes back Radiation: none Frequency: a few times a hour Alleviating factors: nothing Aggravating factors: nothing Status: stable Treatments attempted: ibuprofen Fever: no Nausea: yes Vomiting: yes Weight loss: yes Decreased appetite: yes Diarrhea: yes- 15-20x a day Constipation: no Blood in stool: no Heartburn: yes Jaundice: no Rash: no Dysuria/urinary frequency: no Hematuria: no History of sexually transmitted disease: no Recurrent NSAID use: yes  Relevant past medical, surgical, family and social history reviewed and updated as indicated. Interim medical history since our last visit reviewed. Allergies and medications reviewed and updated.  Review of Systems  Constitutional: Negative.   Respiratory: Negative.    Cardiovascular: Negative.   Gastrointestinal:  Positive for abdominal pain, diarrhea, nausea and vomiting. Negative for abdominal distention, anal bleeding, blood in stool, constipation and rectal pain.  Musculoskeletal: Negative.   Neurological: Negative.   Psychiatric/Behavioral: Negative.      Per HPI unless  specifically indicated above     Objective:    BP (!) 150/101 (BP Location: Left Arm, Cuff Size: Normal)   Pulse (!) 111   Temp 98.9 F (37.2 C) (Oral)   Ht 5' 2.99" (1.6 m)   Wt 201 lb 9.6 oz (91.4 kg)   SpO2 95%   BMI 35.72 kg/m   Wt Readings from Last 3 Encounters:  08/02/22 201 lb 9.6 oz (91.4 kg)  07/05/22 202 lb 3.2 oz (91.7 kg)  06/24/22 215 lb 13.3 oz (97.9 kg)    Physical Exam Vitals and nursing note reviewed.  Constitutional:      General: She is not in acute distress.    Appearance: Normal appearance. She is obese. She is not ill-appearing, toxic-appearing or diaphoretic.  HENT:     Head: Normocephalic and atraumatic.     Right Ear: External ear normal.     Left Ear: External ear normal.     Nose: Nose normal.     Mouth/Throat:     Mouth: Mucous membranes are moist.     Pharynx: Oropharynx is clear.  Eyes:     General: No scleral icterus.       Right eye: No discharge.        Left eye: No discharge.     Extraocular Movements: Extraocular movements intact.     Conjunctiva/sclera: Conjunctivae normal.     Pupils: Pupils are equal, round, and reactive to light.  Cardiovascular:     Rate and Rhythm: Normal rate and regular rhythm.     Pulses:  Normal pulses.     Heart sounds: Normal heart sounds. No murmur heard.    No friction rub. No gallop.  Pulmonary:     Effort: Pulmonary effort is normal. No respiratory distress.     Breath sounds: Normal breath sounds. No stridor. No wheezing, rhonchi or rales.  Chest:     Chest wall: No tenderness.  Abdominal:     General: Abdomen is protuberant.     Palpations: Abdomen is soft.     Tenderness: There is abdominal tenderness in the epigastric area.  Musculoskeletal:        General: Normal range of motion.     Cervical back: Normal range of motion and neck supple.  Skin:    General: Skin is warm and dry.     Capillary Refill: Capillary refill takes less than 2 seconds.     Coloration: Skin is not jaundiced or  pale.     Findings: No bruising, erythema, lesion or rash.  Neurological:     General: No focal deficit present.     Mental Status: She is alert and oriented to person, place, and time. Mental status is at baseline.  Psychiatric:        Mood and Affect: Mood normal.        Behavior: Behavior normal.        Thought Content: Thought content normal.        Judgment: Judgment normal.     Results for orders placed or performed in visit on 07/13/22  Microscopic Examination   Urine  Result Value Ref Range   WBC, UA 0-5 0 - 5 /hpf   RBC, Urine None seen 0 - 2 /hpf   Epithelial Cells (non renal) 0-10 0 - 10 /hpf   Mucus, UA Present (A) Not Estab.   Bacteria, UA None seen None seen/Few  Urinalysis, Routine w reflex microscopic  Result Value Ref Range   Specific Gravity, UA >1.030 (H) 1.005 - 1.030   pH, UA 5.0 5.0 - 7.5   Color, UA Yellow Yellow   Appearance Ur Clear Clear   Leukocytes,UA Negative Negative   Protein,UA 1+ (A) Negative/Trace   Glucose, UA Negative Negative   Ketones, UA Negative Negative   RBC, UA Negative Negative   Bilirubin, UA CANCELED    Urobilinogen, Ur 0.2 0.2 - 1.0 mg/dL   Nitrite, UA Negative Negative   Microscopic Examination See below:       Assessment & Plan:   Problem List Items Addressed This Visit       Other   Alcohol abuse    Will start her naltrexone. Recheck 2 weeks. Call with any concerns.       Other Visit Diagnoses     Epigastric pain    -  Primary   Concern for ulcer vs pancreatitis. Check labs. Will treat with carafate and increase her protonix. Recheck 2 weeks. May need to get her back into GI sooner.   Relevant Orders   Fecal occult blood, imunochemical([facility])   Amylase   CBC with Differential/Platelet   Comp Met (CMET)   Lipase, blood        Follow up plan: Return in about 2 weeks (around 08/16/2022).

## 2022-08-02 NOTE — Assessment & Plan Note (Signed)
Will start her naltrexone. Recheck 2 weeks. Call with any concerns.

## 2022-08-09 DIAGNOSIS — G4733 Obstructive sleep apnea (adult) (pediatric): Secondary | ICD-10-CM | POA: Diagnosis not present

## 2022-08-10 ENCOUNTER — Other Ambulatory Visit: Payer: Self-pay | Admitting: Family Medicine

## 2022-08-10 NOTE — Telephone Encounter (Signed)
Unable to refill per protocol, Rx request is too soon. Last refill 08/02/22, duplicate request.E-Prescribing Status: Receipt confirmed by pharmacy (08/02/2022 10:10 AM EDT).  Requested Prescriptions  Pending Prescriptions Disp Refills   sucralfate (CARAFATE) 1 GM/10ML suspension [Pharmacy Med Name: SUCRALFATE 1GM/10ML SUSPENSION] 420 mL 0    Sig: SHAKE LIQUID AND TAKE 10 ML(1 GRAM) BY MOUTH FOUR TIMES DAILY WITH MEALS& AT BEDTIME     Gastroenterology: Antiacids Passed - 08/10/2022  3:35 AM      Passed - Valid encounter within last 12 months    Recent Outpatient Visits           1 week ago Epigastric pain   [facility] [place], Megan P, DO   1 month ago Chronic hepatitis C without hepatic coma (HCC)   [place] [facility] [place] T, NP   3 months ago Acute non-recurrent maxillary sinusitis   [place] [facility] [place], Megan P, DO   3 months ago 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 [facility])   [facility] [place], Megan P, DO   4 months ago Acute non-recurrent maxillary sinusitis   [place] [facility] [place], Oralia Rud, DO       Future Appointments             In 1 week Laural Benes, Oralia Rud, DO [place] [facility]   In 1 month Midge Minium, MD [facility]

## 2022-08-11 DIAGNOSIS — G4733 Obstructive sleep apnea (adult) (pediatric): Secondary | ICD-10-CM | POA: Diagnosis not present

## 2022-08-17 ENCOUNTER — Ambulatory Visit (INDEPENDENT_AMBULATORY_CARE_PROVIDER_SITE_OTHER): Payer: Medicaid Other | Admitting: Family Medicine

## 2022-08-17 ENCOUNTER — Encounter: Payer: Self-pay | Admitting: Family Medicine

## 2022-08-17 VITALS — BP 131/86 | HR 86 | Temp 99.1°F | Ht 62.99 in | Wt 200.8 lb

## 2022-08-17 DIAGNOSIS — F339 Major depressive disorder, recurrent, unspecified: Secondary | ICD-10-CM

## 2022-08-17 DIAGNOSIS — K219 Gastro-esophageal reflux disease without esophagitis: Secondary | ICD-10-CM | POA: Diagnosis not present

## 2022-08-17 DIAGNOSIS — I479 Paroxysmal tachycardia, unspecified: Secondary | ICD-10-CM | POA: Diagnosis not present

## 2022-08-17 DIAGNOSIS — I10 Essential (primary) hypertension: Secondary | ICD-10-CM | POA: Diagnosis not present

## 2022-08-17 DIAGNOSIS — F1010 Alcohol abuse, uncomplicated: Secondary | ICD-10-CM

## 2022-08-17 MED ORDER — CITALOPRAM HYDROBROMIDE 20 MG PO TABS: ORAL_TABLET | ORAL | 1 refills | Status: DC

## 2022-08-17 MED ORDER — METOPROLOL SUCCINATE ER 25 MG PO TB24: 25.0000 mg | ORAL_TABLET | Freq: Every day | ORAL | 1 refills | Status: DC

## 2022-08-17 MED ORDER — VITAMIN B-1 100 MG PO TABS: 100.0000 mg | ORAL_TABLET | Freq: Every day | ORAL | 1 refills | Status: DC

## 2022-08-17 MED ORDER — LISINOPRIL 10 MG PO TABS: 10.0000 mg | ORAL_TABLET | Freq: Every day | ORAL | 1 refills | Status: DC

## 2022-08-17 MED ORDER — SUCRALFATE 1 GM/10ML PO SUSP: 1.0000 g | Freq: Three times a day (TID) | ORAL | 3 refills | Status: DC

## 2022-08-17 NOTE — Assessment & Plan Note (Addendum)
Under good control on current regimen. Continue current regimen. Continue to monitor. Call with any concerns. Refills given. Labs to be drawn shortly at the hospital.

## 2022-08-17 NOTE — Progress Notes (Signed)
BP 131/86 (BP Location: Left Arm, Cuff Size: Normal)   Pulse 86   Temp 99.1 F (37.3 C) (Oral)   Ht 5' 2.99" (1.6 m)   Wt 200 lb 12.8 oz (91.1 kg)   SpO2 94%   BMI 35.58 kg/m    Subjective:    Patient ID: Katie Stanley, female    DOB: 1986/02/23, 37 y.o.   MRN: [id number]  HPI: Katie Stanley is a 37 y.o. female  No chief complaint on file.  Abdominal pain resolved with carafate. Feeling well. No concerns. Seeing GI in June.   HYPERTENSION  Hypertension status: controlled  Satisfied with current treatment? yes Duration of hypertension: chronic BP monitoring frequency:  rarely BP medication side effects:  no Medication compliance: excellent compliance Previous BP meds: metoprolol, lisinopril Aspirin: no Recurrent headaches: no Visual changes: no Palpitations: no Dyspnea: no Chest pain: no Lower extremity edema: no Dizzy/lightheaded: no  DEPRESSION Mood status: controlled Satisfied with current treatment?: yes Symptom severity: mild  Duration of current treatment : chronic Side effects: no Medication compliance: excellent compliance Psychotherapy/counseling: no  Previous psychiatric medications: celexa Depressed mood: yes Anxious mood: no Anhedonia: no Significant weight loss or gain: no Insomnia: no  Fatigue: yes Feelings of worthlessness or guilt: no Impaired concentration/indecisiveness: no Suicidal ideations: no Hopelessness: no Crying spells: no    08/17/2022    3:46 PM 08/02/2022    9:47 AM 07/05/2022    3:02 PM 04/28/2022    2:49 PM 03/14/2022    2:54 PM  Depression screen PHQ 2/9  Decreased Interest 0 0 0 0 1  Down, Depressed, Hopeless 0 0 0 0 0  PHQ - 2 Score 0 0 0 0 1  Altered sleeping 0 1 1 1 3   Tired, decreased energy 0 1 1 1 3   Change in appetite 0 1 0 2 3  Feeling bad or failure about yourself  0 0 0 0 1  Trouble concentrating 0 0 0 0 0  Moving slowly or fidgety/restless 0 0 0 0 0  Suicidal thoughts 0 0 0 0 0  PHQ-9 Score 0 3 2 4  11   Difficult doing work/chores Not difficult at all Somewhat difficult Somewhat difficult Somewhat difficult Very difficult    Relevant past medical, surgical, family and social history reviewed and updated as indicated. Interim medical history since our last visit reviewed. Allergies and medications reviewed and updated.  Review of Systems  Constitutional: Negative.   Respiratory: Negative.    Cardiovascular: Negative.   Gastrointestinal: Negative.   Musculoskeletal: Negative.   Neurological: Negative.   Psychiatric/Behavioral: Negative.      Per HPI unless specifically indicated above     Objective:    BP 131/86 (BP Location: Left Arm, Cuff Size: Normal)   Pulse 86   Temp 99.1 F (37.3 C) (Oral)   Ht 5' 2.99" (1.6 m)   Wt 200 lb 12.8 oz (91.1 kg)   SpO2 94%   BMI 35.58 kg/m   Wt Readings from Last 3 Encounters:  08/17/22 200 lb 12.8 oz (91.1 kg)  08/02/22 201 lb 9.6 oz (91.4 kg)  07/05/22 202 lb 3.2 oz (91.7 kg)    Physical Exam Vitals and nursing note reviewed.  Constitutional:      General: She is not in acute distress.    Appearance: Normal appearance. She is obese. She is not ill-appearing, toxic-appearing or diaphoretic.  HENT:     Head: Normocephalic and atraumatic.     Right Ear: External  ear normal.     Left Ear: External ear normal.     Nose: Nose normal.     Mouth/Throat:     Mouth: Mucous membranes are moist.     Pharynx: Oropharynx is clear.  Eyes:     General: No scleral icterus.       Right eye: No discharge.        Left eye: No discharge.     Extraocular Movements: Extraocular movements intact.     Conjunctiva/sclera: Conjunctivae normal.     Pupils: Pupils are equal, round, and reactive to light.  Cardiovascular:     Rate and Rhythm: Normal rate and regular rhythm.     Pulses: Normal pulses.     Heart sounds: Normal heart sounds. No murmur heard.    No friction rub. No gallop.  Pulmonary:     Effort: Pulmonary effort is normal. No  respiratory distress.     Breath sounds: Normal breath sounds. No stridor. No wheezing, rhonchi or rales.  Chest:     Chest wall: No tenderness.  Musculoskeletal:        General: Normal range of motion.     Cervical back: Normal range of motion and neck supple.  Skin:    General: Skin is warm and dry.     Capillary Refill: Capillary refill takes less than 2 seconds.     Coloration: Skin is not jaundiced or pale.     Findings: No bruising, erythema, lesion or rash.  Neurological:     General: No focal deficit present.     Mental Status: She is alert and oriented to person, place, and time. Mental status is at baseline.  Psychiatric:        Mood and Affect: Mood normal.        Behavior: Behavior normal.        Thought Content: Thought content normal.        Judgment: Judgment normal.     Results for orders placed or performed in visit on 07/13/22  Microscopic Examination   Urine  Result Value Ref Range   WBC, UA 0-5 0 - 5 /hpf   RBC, Urine None seen 0 - 2 /hpf   Epithelial Cells (non renal) 0-10 0 - 10 /hpf   Mucus, UA Present (A) Not Estab.   Bacteria, UA None seen None seen/Few  Urinalysis, Routine w reflex microscopic  Result Value Ref Range   Specific Gravity, UA >1.030 (H) 1.005 - 1.030   pH, UA 5.0 5.0 - 7.5   Color, UA Yellow Yellow   Appearance Ur Clear Clear   Leukocytes,UA Negative Negative   Protein,UA 1+ (A) Negative/Trace   Glucose, UA Negative Negative   Ketones, UA Negative Negative   RBC, UA Negative Negative   Bilirubin, UA CANCELED    Urobilinogen, Ur 0.2 0.2 - 1.0 mg/dL   Nitrite, UA Negative Negative   Microscopic Examination See below:       Assessment & Plan:   Problem List Items Addressed This Visit       Cardiovascular and Mediastinum   Paroxysmal tachycardia (HCC)    Under good control on metoprolol. Call with any concerns.       Relevant Medications   lisinopril (ZESTRIL) 10 MG tablet   metoprolol succinate (TOPROL-XL) 25 MG 24 hr  tablet   Primary hypertension    Under good control on current regimen. Continue current regimen. Continue to monitor. Call with any concerns. Refills given. Labs to be drawn shortly  at the hospital.        Relevant Medications   lisinopril (ZESTRIL) 10 MG tablet   metoprolol succinate (TOPROL-XL) 25 MG 24 hr tablet     Digestive   Gastroesophageal reflux disease - Primary    Improved with her carafate. Has appt to see Gi in June. Keep that appointment. Call with any concerns.       Relevant Medications   sucralfate (CARAFATE) 1 GM/10ML suspension     Other   Depression, recurrent (HCC)    Under good control on current regimen. Continue current regimen. Continue to monitor. Call with any concerns. Refills given.      Relevant Medications   citalopram (CELEXA) 20 MG tablet   Alcohol abuse    Continues to wean down. Has naltrexone at home. Call with any concerns.         Follow up plan: Return October, Physical.

## 2022-08-17 NOTE — Assessment & Plan Note (Signed)
Under good control on metoprolol. Call with any concerns.

## 2022-08-17 NOTE — Assessment & Plan Note (Signed)
Continues to wean down. Has naltrexone at home. Call with any concerns.

## 2022-08-17 NOTE — Assessment & Plan Note (Signed)
Improved with her carafate. Has appt to see Gi in June. Keep that appointment. Call with any concerns.

## 2022-08-17 NOTE — Assessment & Plan Note (Signed)
Under good control on current regimen. Continue current regimen. Continue to monitor. Call with any concerns. Refills given.   

## 2022-09-04 ENCOUNTER — Other Ambulatory Visit: Payer: Self-pay | Admitting: Family Medicine

## 2022-09-05 NOTE — Telephone Encounter (Signed)
Rx  08/17/22 #90 1RF- too soon Requested Prescriptions  Pending Prescriptions Disp Refills   lisinopril (ZESTRIL) 10 MG tablet [Pharmacy Med Name: LISINOPRIL 10MG  TABLETS] 30 tablet     Sig: TAKE 1 TABLET(10 MG) BY MOUTH DAILY     Cardiovascular:  ACE Inhibitors Failed - 09/04/2022  3:35 AM      Failed - K in normal range and within 180 days    Potassium  Date Value Ref Range Status  07/11/2022 3.1 (L) 3.5 - 5.1 mmol/L Final         Passed - Cr in normal range and within 180 days    Creatinine, Ser  Date Value Ref Range Status  07/11/2022 0.58 0.44 - 1.00 mg/dL Final         Passed - Patient is not pregnant      Passed - Last BP in normal range    BP Readings from Last 1 Encounters:  08/17/22 131/86         Passed - Valid encounter within last 6 months    Recent Outpatient Visits           2 weeks ago Gastroesophageal reflux disease, unspecified whether esophagitis present   [facility] [place], Megan P, DO   1 month ago Epigastric pain   [place] [facility] [place], Megan P, DO   2 months ago Chronic hepatitis C without hepatic coma (HCC)   [place] [facility] [place] T, NP   3 months ago Acute non-recurrent maxillary sinusitis   [place] [facility] [place], Megan P, DO   4 months ago 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 [facility])   [place] [facility] [place], Oralia Rud, DO       Future Appointments             In 2 days Midge Minium, MD [facility]   In 4 months Dorcas Carrow, DO [place] [facility], [facility]

## 2022-09-07 ENCOUNTER — Ambulatory Visit (INDEPENDENT_AMBULATORY_CARE_PROVIDER_SITE_OTHER): Payer: Medicaid Other | Admitting: Gastroenterology

## 2022-09-07 ENCOUNTER — Encounter: Payer: Self-pay | Admitting: Gastroenterology

## 2022-09-07 VITALS — BP 108/70 | HR 96 | Temp 98.4°F | Ht 63.0 in | Wt 194.0 lb

## 2022-09-07 DIAGNOSIS — R197 Diarrhea, unspecified: Secondary | ICD-10-CM

## 2022-09-07 MED ORDER — NA SULFATE-K SULFATE-MG SULF 17.5-3.13-1.6 GM/177ML PO SOLN: 1.0000 | Freq: Once | ORAL | 0 refills | Status: AC

## 2022-09-07 NOTE — Progress Notes (Signed)
Primary Care Physician: Dorcas Carrow, DO  Primary Gastroenterologist:  Dr. Midge Minium  Chief Complaint  Patient presents with   Hepatitis C   Diarrhea    HPI: Katie Stanley is a 37 y.o. female here with a history of abdominal pain and the patient was recently in the hospital for abdominal pain.  The patient also has a history of hepatitis C and alcohol use disorder.  The patient was seen by me in the past for abnormal liver enzymes consistent with her having alcoholic hepatitis.  The patient had reported that she was told that her hepatitis resolved on its own and she did not need treatment for it.  The patient's last hepatitis C viral load was negative.  When the patient saw her primary care provider she was reporting that she was having diarrhea 10-15 times a day.  In March the patient had a CT scan that showed:  IMPRESSION: 1. Negative for acute PE or thoracic aortic dissection. 2. Wall thickening and mucosal enhancement in the right lower quadrant small bowel suggesting enteritis. 3. Hepatomegaly with diffuse fatty infiltration. 4. Small paraumbilical hernia containing only mesenteric fat. 5. Healing anterior right rib fractures.  The patient's liver enzymes have shown:  Component     Latest Ref Rng 06/22/2022 06/23/2022 07/11/2022  AST     15 - 41 U/L 174 (H)  174 (H)  131 (H)   ALT     0 - 44 U/L 71 (H)  67 (H)  69 (H)   Alkaline Phosphatase     38 - 126 U/L 230 (H)  207 (H)  163 (H)   Total Bilirubin     0.3 - 1.2 mg/dL 4.6 (H)  3.6 (H)  1.5 (H)    The patient continues to drink and states that she drinks a about 1/5 of whiskey per day.  She states that she knows she has to stop drinking.  Her main concern today is that she has had diarrhea since January and rectal pain.  The rectal pain is a sharp pain and she states that she also has not had as much diarrhea in the last 3 days.  Past Medical History:  Diagnosis Date   Anxiety    Biliary calculi 12/07/2009    Cholelithiasis    Depression    GERD (gastroesophageal reflux disease)    Hepatitis C    body "self healed" per patient   Palpitations    Retained intrauterine contraceptive device (IUD) 01/20/2020   Substance abuse (HCC)     Current Outpatient Medications  Medication Sig Dispense Refill   citalopram (CELEXA) 20 MG tablet TAKE 1 TABLET(20 MG) BY MOUTH DAILY 90 tablet 1   lidocaine (LIDODERM) 5 % Place 1 patch onto the skin daily. Remove & Discard patch within 12 hours or as directed by MD 30 patch 0   lisinopril (ZESTRIL) 10 MG tablet Take 1 tablet (10 mg total) by mouth daily. 90 tablet 1   Magnesium 250 MG TABS Take 1 tablet (250 mg total) by mouth 2 (two) times daily. 44 tablet 0   metoprolol succinate (TOPROL-XL) 25 MG 24 hr tablet Take 1 tablet (25 mg total) by mouth daily. 90 tablet 1   naltrexone (DEPADE) 50 MG tablet Take 1 tablet (50 mg total) by mouth daily. Do not take any opiods with this medicine or you will go into withdrawal 30 tablet 2   Naphazoline HCl (CLEAR EYES OP) Place 1 drop into both eyes  daily as needed (itching).     pantoprazole (PROTONIX) 40 MG tablet Take 2 tablets (80 mg total) by mouth daily. 90 tablet 1   potassium chloride SA (KLOR-CON M) 20 MEQ tablet Take 1 tablet (20 mEq total) by mouth daily. 22 tablet 0   sucralfate (CARAFATE) 1 GM/10ML suspension Take 10 mLs (1 g total) by mouth 4 (four) times daily -  with meals and at bedtime. 420 mL 3   No current facility-administered medications for this visit.    Allergies as of 09/07/2022 - Review Complete 09/07/2022  Allergen Reaction Noted   Penicillins Swelling 12/19/2014    ROS:  General: Negative for anorexia, weight loss, fever, chills, fatigue, weakness. ENT: Negative for hoarseness, difficulty swallowing , nasal congestion. CV: Negative for chest pain, angina, palpitations, dyspnea on exertion, peripheral edema.  Respiratory: Negative for dyspnea at rest, dyspnea on exertion, cough, sputum,  wheezing.  GI: See history of present illness. GU:  Negative for dysuria, hematuria, urinary incontinence, urinary frequency, nocturnal urination.  Endo: Negative for unusual weight change.    Physical Examination:   BP 108/70 (BP Location: Left Arm, Patient Position: Sitting, Cuff Size: Normal)   Pulse 96   Temp 98.4 F (36.9 C) (Oral)   Ht 5\' 3"  (1.6 m)   Wt 194 lb (88 kg)   BMI 34.37 kg/m   General: Well-nourished, well-developed in no acute distress.  Eyes: No icterus. Conjunctivae pink. Lungs: Clear to auscultation bilaterally. Non-labored. Heart: Regular rate and rhythm, no murmurs rubs or gallops.  Abdomen: Bowel sounds are normal, nontender, nondistended, no hepatosplenomegaly or masses, no abdominal bruits or hernia , no rebound or guarding.   Rectal: An anal fissure was noted on rectal exam Extremities: No lower extremity edema. No clubbing or deformities. Neuro: Alert and oriented x 3.  Grossly intact. Skin: Warm and dry, no jaundice.   Psych: Alert and cooperative, normal mood and affect.  Labs:    Imaging Studies: No results found.  Assessment and Plan:   Katie Stanley is a 37 y.o. y/o female who comes in with diarrhea for the last few months with improvement over the last 3 days.  She states that she can have anywhere from 10-20 bowel movements a day.  The patient also reports that she has rectal pain and on rectal exam is clear that she has a anal fissure.  The patient has been told about anal fissure and has been told to avoid any trauma to her rectum with hard stools.  She was told to increase fiber in her diet.  The patient will also be set up for a colonoscopy due to her chronic diarrhea.  The patient has again been told that she needs to stop drinking due to her abnormal liver enzymes and likelihood of advanced liver disease if she does not stop drinking.  The patient has been explained the plan and agrees with it.     Midge Minium, MD. Clementeen Graham    Note:  This dictation was prepared with Dragon dictation along with smaller phrase technology. Any transcriptional errors that result from this process are unintentional.

## 2022-09-09 DIAGNOSIS — G4733 Obstructive sleep apnea (adult) (pediatric): Secondary | ICD-10-CM | POA: Diagnosis not present

## 2022-09-11 DIAGNOSIS — G4733 Obstructive sleep apnea (adult) (pediatric): Secondary | ICD-10-CM | POA: Diagnosis not present

## 2022-09-18 ENCOUNTER — Ambulatory Visit: Payer: Medicaid Other | Admitting: Gastroenterology

## 2022-09-20 ENCOUNTER — Encounter: Payer: Self-pay | Admitting: Gastroenterology

## 2022-09-20 ENCOUNTER — Ambulatory Visit
Admission: RE | Admit: 2022-09-20 | Discharge: 2022-09-20 | Disposition: A | Discharge status: Home / Self Care | Payer: Medicaid Other | Attending: Gastroenterology | Admitting: Gastroenterology

## 2022-09-20 ENCOUNTER — Ambulatory Visit: Payer: Medicaid Other | Admitting: Anesthesiology

## 2022-09-20 ENCOUNTER — Encounter
Admission: RE | Disposition: A | Discharge status: Home / Self Care | Payer: Self-pay | Source: Home / Self Care | Attending: Gastroenterology

## 2022-09-20 DIAGNOSIS — F32A Depression, unspecified: Secondary | ICD-10-CM | POA: Insufficient documentation

## 2022-09-20 DIAGNOSIS — K219 Gastro-esophageal reflux disease without esophagitis: Secondary | ICD-10-CM | POA: Diagnosis not present

## 2022-09-20 DIAGNOSIS — I10 Essential (primary) hypertension: Secondary | ICD-10-CM | POA: Insufficient documentation

## 2022-09-20 DIAGNOSIS — F419 Anxiety disorder, unspecified: Secondary | ICD-10-CM | POA: Insufficient documentation

## 2022-09-20 DIAGNOSIS — K529 Noninfective gastroenteritis and colitis, unspecified: Secondary | ICD-10-CM | POA: Diagnosis present

## 2022-09-20 DIAGNOSIS — Z6833 Body mass index (BMI) 33.0-33.9, adult: Secondary | ICD-10-CM | POA: Diagnosis not present

## 2022-09-20 DIAGNOSIS — F17210 Nicotine dependence, cigarettes, uncomplicated: Secondary | ICD-10-CM | POA: Insufficient documentation

## 2022-09-20 DIAGNOSIS — R197 Diarrhea, unspecified: Secondary | ICD-10-CM | POA: Diagnosis not present

## 2022-09-20 DIAGNOSIS — E669 Obesity, unspecified: Secondary | ICD-10-CM | POA: Insufficient documentation

## 2022-09-20 DIAGNOSIS — K641 Second degree hemorrhoids: Secondary | ICD-10-CM | POA: Insufficient documentation

## 2022-09-20 DIAGNOSIS — K635 Polyp of colon: Secondary | ICD-10-CM | POA: Diagnosis not present

## 2022-09-20 DIAGNOSIS — D123 Benign neoplasm of transverse colon: Secondary | ICD-10-CM | POA: Diagnosis not present

## 2022-09-20 HISTORY — PX: COLONOSCOPY WITH PROPOFOL: SHX5780

## 2022-09-20 LAB — POCT PREGNANCY, URINE: Preg Test, Ur: NEGATIVE

## 2022-09-20 SURGERY — COLONOSCOPY WITH PROPOFOL
Anesthesia: General

## 2022-09-20 MED ORDER — MIDAZOLAM HCL 2 MG/2ML IJ SOLN
INTRAMUSCULAR | Status: AC
  Filled 2022-09-20: qty 2

## 2022-09-20 MED ORDER — LIDOCAINE HCL (PF) 1 % IJ SOLN
INTRAMUSCULAR | Status: AC
  Filled 2022-09-20: qty 2

## 2022-09-20 MED ORDER — PROPOFOL 10 MG/ML IV BOLUS
INTRAVENOUS | Status: DC | PRN
  Administered 2022-09-20 (×2): 20 mg via INTRAVENOUS
  Administered 2022-09-20: 100 mg via INTRAVENOUS
  Administered 2022-09-20 (×2): 20 mg via INTRAVENOUS

## 2022-09-20 MED ORDER — MIDAZOLAM HCL 2 MG/2ML IJ SOLN
INTRAMUSCULAR | Status: DC | PRN
  Administered 2022-09-20: 2 mg via INTRAVENOUS

## 2022-09-20 MED ORDER — DEXMEDETOMIDINE HCL IN NACL 200 MCG/50ML IV SOLN
INTRAVENOUS | Status: DC | PRN
  Administered 2022-09-20: 8 ug via INTRAVENOUS
  Administered 2022-09-20: 4 ug via INTRAVENOUS
  Administered 2022-09-20: 8 ug via INTRAVENOUS

## 2022-09-20 MED ORDER — LIDOCAINE HCL (CARDIAC) PF 100 MG/5ML IV SOSY
PREFILLED_SYRINGE | INTRAVENOUS | Status: DC | PRN
  Administered 2022-09-20: 40 mg via INTRAVENOUS

## 2022-09-20 MED ORDER — SODIUM CHLORIDE 0.9 % IV SOLN
INTRAVENOUS | Status: DC
  Administered 2022-09-20: 20 mL/h via INTRAVENOUS

## 2022-09-20 MED ORDER — GLYCOPYRROLATE 0.2 MG/ML IJ SOLN
INTRAMUSCULAR | Status: DC | PRN
  Administered 2022-09-20: .2 mg via INTRAVENOUS

## 2022-09-20 MED ORDER — PROPOFOL 500 MG/50ML IV EMUL
INTRAVENOUS | Status: DC | PRN
  Administered 2022-09-20: 175 ug/kg/min via INTRAVENOUS

## 2022-09-20 NOTE — Anesthesia Preprocedure Evaluation (Addendum)
Anesthesia Evaluation  Patient identified by MRN, date of birth, ID band Patient awake    Reviewed: Allergy & Precautions, NPO status , Patient's Chart, lab work & pertinent test results  History of Anesthesia Complications Negative for: history of anesthetic complications  Airway Mallampati: IV   Neck ROM: Full    Dental  (+) Missing   Pulmonary Current Smoker (1/2 ppd) and Patient abstained from smoking.   Pulmonary exam normal breath sounds clear to auscultation       Cardiovascular hypertension, Normal cardiovascular exam Rhythm:Regular Rate:Normal  ECG 06/21/22:  Sinus tachycardia Paired ventricular premature complexes Incomplete right bundle branch block Inferior infarct, age indeterminate Lateral leads are also involved Prolonged QT interval  Echo 04/06/22:    1. The left ventricle is normal in size with upper normal wall thickness.    2. The left ventricular systolic function is normal, LVEF is visually  estimated at > 55%.    3. The right ventricle is normal in size, with normal systolic function.    4. There are no significant valvular abnormalities.     Neuro/Psych  PSYCHIATRIC DISORDERS Anxiety Depression    Marijuana use, less than daily; alcohol use disorder, 10-15 drinks daily    GI/Hepatic ,GERD  ,,(+) Hepatitis -, C  Endo/Other  Obesity   Renal/GU      Musculoskeletal   Abdominal   Peds  Hematology negative hematology ROS (+)   Anesthesia Other Findings   Reproductive/Obstetrics                             Anesthesia Physical Anesthesia Plan  ASA: 3  Anesthesia Plan: General   Post-op Pain Management:    Induction: Intravenous  PONV Risk Score and Plan: 2 and Propofol infusion, TIVA and Treatment may vary due to age or medical condition  Airway Management Planned: Natural Airway  Additional Equipment:   Intra-op Plan:   Post-operative Plan:    Informed Consent: I have reviewed the patients History and Physical, chart, labs and discussed the procedure including the risks, benefits and alternatives for the proposed anesthesia with the patient or authorized representative who has indicated his/her understanding and acceptance.       Plan Discussed with: CRNA  Anesthesia Plan Comments: (LMA/GETA backup discussed.  Patient consented for risks of anesthesia including but not limited to:  - adverse reactions to medications - damage to eyes, teeth, lips or other oral mucosa - nerve damage due to positioning  - sore throat or hoarseness - damage to heart, brain, nerves, lungs, other parts of body or loss of life  Informed patient about role of CRNA in peri- and intra-operative care.  Patient voiced understanding.)        Anesthesia Quick Evaluation

## 2022-09-20 NOTE — Anesthesia Procedure Notes (Signed)
Date/Time: 09/20/2022 9:12 AM  Performed by: Stormy Fabian, CRNAPre-anesthesia Checklist: Patient identified, Emergency Drugs available, Suction available and Patient being monitored Patient Re-evaluated:Patient Re-evaluated prior to induction Oxygen Delivery Method: Nasal cannula Induction Type: IV induction Dental Injury: Teeth and Oropharynx as per pre-operative assessment  Comments: Nasal cannula with etCO2 monitoring

## 2022-09-20 NOTE — H&P (Signed)
Midge Minium, MD [facility] [address], [address] [place], [place] [address] Phone:[phone] Fax : [phone]  Primary Care Physician:  Dorcas Carrow, DO Primary Gastroenterologist:  Dr. Servando Snare  Pre-Procedure History & Physical: HPI:  Katie Stanley is a 37 y.o. female is here for an colonoscopy.   Past Medical History:  Diagnosis Date   Anxiety    Biliary calculi 12/07/2009   Cholelithiasis    Depression    GERD (gastroesophageal reflux disease)    Hepatitis C    body "self healed" per patient   Palpitations    Retained intrauterine contraceptive device (IUD) 01/20/2020   Substance abuse (HCC)     Past Surgical History:  Procedure Laterality Date   CHOLECYSTECTOMY     2011   ESOPHAGOGASTRODUODENOSCOPY N/A 09/29/2021   Procedure: ESOPHAGOGASTRODUODENOSCOPY (EGD);  Surgeon: Midge Minium, MD;  Location: [facility] ENDOSCOPY;  Service: Endoscopy;  Laterality: N/A;   ESOPHAGOGASTRODUODENOSCOPY (EGD) WITH PROPOFOL N/A 01/21/2018   Procedure: ESOPHAGOGASTRODUODENOSCOPY (EGD) WITH Biopsy;  Surgeon: Midge Minium, MD;  Location: [facility];  Service: Endoscopy;  Laterality: N/A;   HYSTEROSCOPY WITH D & C N/A 01/20/2020   Procedure: DILATATION AND CURETTAGE /HYSTEROSCOPY;  Surgeon: Conard Novak, MD;  Location: [facility] ORS;  Service: Gynecology;  Laterality: N/A;   INTRAUTERINE DEVICE (IUD) INSERTION N/A 01/20/2020   Procedure: INTRAUTERINE DEVICE (IUD) INSERTION MIRENA IUD;  Surgeon: Conard Novak, MD;  Location: [facility] ORS;  Service: Gynecology;  Laterality: N/A;   IUD REMOVAL N/A 01/20/2020   Procedure: INTRAUTERINE DEVICE (IUD) REMOVAL;  Surgeon: Conard Novak, MD;  Location: [facility] ORS;  Service: Gynecology;  Laterality: N/A;   PORT-A-CATH REMOVAL  02/08/2022   PORTA CATH INSERTION N/A 01/01/2019   Procedure: PORTA CATH INSERTION;  Surgeon: Annice Needy, MD;  Location: [facility] INVASIVE CV LAB;  Service: Cardiovascular;  Laterality: N/A;   VASCULAR SURGERY       Prior to Admission medications   Medication Sig Start Date End Date Taking? Authorizing Provider  citalopram (CELEXA) 20 MG tablet TAKE 1 TABLET(20 MG) BY MOUTH DAILY 08/17/22  Yes Johnson, Megan P, DO  lidocaine (LIDODERM) 5 % Place 1 patch onto the skin daily. Remove & Discard patch within 12 hours or as directed by MD 04/28/22  Yes Johnson, Megan P, DO  lisinopril (ZESTRIL) 10 MG tablet Take 1 tablet (10 mg total) by mouth daily. 08/17/22  Yes Johnson, Megan P, DO  Magnesium 250 MG TABS Take 1 tablet (250 mg total) by mouth 2 (two) times daily. 07/11/22  Yes Cannady, Jolene T, NP  meloxicam (MOBIC) 7.5 MG tablet Take 7.5 mg by mouth daily.   Yes [provider]  metoprolol succinate (TOPROL-XL) 25 MG 24 hr tablet Take 1 tablet (25 mg total) by mouth daily. 08/17/22  Yes Johnson, Megan P, DO  naltrexone (DEPADE) 50 MG tablet Take 1 tablet (50 mg total) by mouth daily. Do not take any opiods with this medicine or you will go into withdrawal 08/17/22  Yes Johnson, Megan P, DO  Naphazoline HCl (CLEAR EYES OP) Place 1 drop into both eyes daily as needed (itching).   Yes [provider]  pantoprazole (PROTONIX) 40 MG tablet Take 2 tablets (80 mg total) by mouth daily. 08/02/22  Yes Johnson, Megan P, DO  potassium chloride SA (KLOR-CON M) 20 MEQ tablet Take 1 tablet (20 mEq total) by mouth daily. 07/11/22  Yes Cannady, Jolene T, NP  sucralfate (CARAFATE) 1 GM/10ML suspension Take 10 mLs (1 g total)  by mouth 4 (four) times daily -  with meals and at bedtime. 08/17/22  Yes Johnson, Megan P, DO    Allergies as of 09/07/2022 - Review Complete 09/07/2022  Allergen Reaction Noted   Penicillins Swelling 12/19/2014    Family History  Problem Relation Age of Onset   Hypertension Mother    Diabetes Father    Hypertension Father    Liver cancer Maternal Grandmother    Hypertension Maternal Grandfather    Breast cancer Paternal Grandmother    Heart disease Paternal Grandfather     Social  History   Socioeconomic History   Marital status: Single    Spouse name: Not on file   Number of children: Not on file   Years of education: Not on file   Highest education level: Not on file  Occupational History   Not on file  Tobacco Use   Smoking status: Every Day    Packs/day: 1.00    Years: 12.00    Additional pack years: 0.00    Total pack years: 12.00    Types: Cigarettes   Smokeless tobacco: Never  Vaping Use   Vaping Use: Never used  Substance and Sexual Activity   Alcohol use: Yes    Comment: 6 BEERS SOME DAY, 20 shots per day, had some beers6/21/2023   Drug use: Yes    Types: Marijuana    Comment: 09/28/2021   Sexual activity: Yes    Birth control/protection: Implant, I.U.D.  Other Topics Concern   Not on file  Social History Narrative   Not on file   Social Determinants of Health   Financial Resource Strain: Not on file  Food Insecurity: No Food Insecurity (06/21/2022)   Hunger Vital Sign    Worried About Running Out of Food in the Last Year: Never true    Ran Out of Food in the Last Year: Never true  Transportation Needs: No Transportation Needs (06/21/2022)   PRAPARE - Administrator, Civil Service (Medical): No    Lack of Transportation (Non-Medical): No  Physical Activity: Not on file  Stress: Not on file  Social Connections: Not on file  Intimate Partner Violence: Not At Risk (06/21/2022)   Humiliation, Afraid, Rape, and Kick questionnaire    Fear of Current or Ex-Partner: No    Emotionally Abused: No    Physically Abused: No    Sexually Abused: No    Review of Systems: See HPI, otherwise negative ROS  Physical Exam: BP (!) 154/97   Pulse (!) 114   Temp (!) 97.1 F (36.2 C) (Temporal)   Resp 20   Ht 5\' 3"  (1.6 m)   Wt 85.3 kg   SpO2 97%   BMI 33.30 kg/m  General:   Alert,  pleasant and cooperative in NAD Head:  Normocephalic and atraumatic. Neck:  Supple; no masses or thyromegaly. Lungs:  Clear throughout to  auscultation.    Heart:  Regular rate and rhythm. Abdomen:  Soft, nontender and nondistended. Normal bowel sounds, without guarding, and without rebound.   Neurologic:  Alert and  oriented x4;  grossly normal neurologically.  Impression/Plan: Katie Stanley is here for an colonoscopy to be performed for diarrhea  Risks, benefits, limitations, and alternatives regarding  colonoscopy have been reviewed with the patient.  Questions have been answered.  All parties agreeable.   Midge Minium, MD  09/20/2022, 9:07 AM

## 2022-09-20 NOTE — Anesthesia Postprocedure Evaluation (Signed)
Anesthesia Post Note  Patient: Katie Stanley  Procedure(s) Performed: COLONOSCOPY WITH PROPOFOL  Patient location during evaluation: PACU Anesthesia Type: General Level of consciousness: awake and alert, oriented and patient cooperative Pain management: pain level controlled Vital Signs Assessment: post-procedure vital signs reviewed and stable Respiratory status: spontaneous breathing, nonlabored ventilation and respiratory function stable Cardiovascular status: blood pressure returned to baseline and stable Postop Assessment: adequate PO intake Anesthetic complications: no   No notable events documented.   Last Vitals:  Vitals:   09/20/22 0947 09/20/22 0957  BP: 110/75 121/77  Pulse: 94 90  Resp: (!) 23 18  Temp:    SpO2: 96% 98%    Last Pain:  Vitals:   09/20/22 0957  TempSrc:   PainSc: 0-No pain                 Reed Breech

## 2022-09-20 NOTE — Transfer of Care (Signed)
Immediate Anesthesia Transfer of Care Note  Patient: Katie Stanley  Procedure(s) Performed: Procedure(s): COLONOSCOPY WITH PROPOFOL (N/A)  Patient Location: PACU and Endoscopy Unit  Anesthesia Type:General  Level of Consciousness: sedated  Airway & Oxygen Therapy: Patient Spontanous Breathing and Patient connected to nasal cannula oxygen  Post-op Assessment: Report given to RN and Post -op Vital signs reviewed and stable  Post vital signs: Reviewed and stable  Last Vitals:  Vitals:   09/20/22 0843 09/20/22 0937  BP: (!) 154/97 102/60  Pulse: (!) 114 99  Resp: 20 (!) 24  Temp: (!) 36.2 C (!) 36.1 C  SpO2: 97% 94%    Complications: No apparent anesthesia complications

## 2022-09-20 NOTE — Op Note (Signed)
[facility] Gastroenterology Patient Name: Katie Stanley Procedure Date: 09/20/2022 8:57 AM MRN: [id number] Account #: [id number] Date of Birth: 02/08/1986 Admit Type: Outpatient Age: 37 Room: [facility] ENDO ROOM 1 Gender: Female Note Status: Finalized Instrument Name: Nelda Marseille [id number] Procedure:             Colonoscopy Indications:           Chronic diarrhea Providers:             Midge Minium MD, MD Referring MD:          Dorcas Carrow (Referring MD) Medicines:             Propofol per Anesthesia Complications:         No immediate complications. Procedure:             Pre-Anesthesia Assessment:                        - Prior to the procedure, a History and Physical was                         performed, and patient medications and allergies were                         reviewed. The patient's tolerance of previous                         anesthesia was also reviewed. The risks and benefits                         of the procedure and the sedation options and risks                         were discussed with the patient. All questions were                         answered, and informed consent was obtained. Prior                         Anticoagulants: The patient has taken no anticoagulant                         or antiplatelet agents. ASA Grade Assessment: II - A                         patient with mild systemic disease. After reviewing                         the risks and benefits, the patient was deemed in                         satisfactory condition to undergo the procedure.                        After obtaining informed consent, the colonoscope was                         passed under direct vision. Throughout the procedure,  the patient's blood pressure, pulse, and oxygen                         saturations were monitored continuously. The                         Colonoscope was introduced through the anus and                          advanced to the the cecum, identified by appendiceal                         orifice and ileocecal valve. The colonoscopy was                         performed without difficulty. The patient tolerated                         the procedure well. The quality of the bowel                         preparation was excellent. Findings:      The perianal and digital rectal examinations were normal.      A 4 mm polyp was found in the transverse colon. The polyp was sessile.       The polyp was removed with a cold biopsy forceps. Resection and       retrieval were complete.      Non-bleeding internal hemorrhoids were found during retroflexion. The       hemorrhoids were Grade II (internal hemorrhoids that prolapse but reduce       spontaneously).      Biopsies were taken with a cold forceps in the entire colon for       histology. Random biopsies were obtained with cold forceps for histology       randomly. Impression:            - One 4 mm polyp in the transverse colon, removed with                         a cold biopsy forceps. Resected and retrieved.                        - Non-bleeding internal hemorrhoids.                        - Biopsies were taken with a cold forceps for                         histology in the entire colon.                        - Random biopsies were obtained. Recommendation:        - Discharge patient to home.                        - Resume previous diet.                        - Continue present medications.                        -  Await pathology results.                        - If the pathology report reveals adenomatous tissue,                         then repeat the colonoscopy for surveillance in 7                         years. Procedure Code(s):     --- Professional ---                        [id number], Colonoscopy, flexible; with biopsy, single or                         multiple Diagnosis Code(s):     --- Professional ---                         K52.9, Noninfective gastroenteritis and colitis,                         unspecified                        D12.3, Benign neoplasm of transverse colon (hepatic                         flexure or splenic flexure) CPT copyright 2022 American Medical Association. All rights reserved. The codes documented in this report are preliminary and upon coder review may  be revised to meet current compliance requirements. Midge Minium MD, MD 09/20/2022 9:33:21 AM This report has been signed electronically. Number of Addenda: 0 Note Initiated On: 09/20/2022 8:57 AM Scope Withdrawal Time: 0 hours 5 minutes 55 seconds  Total Procedure Duration: 0 hours 10 minutes 33 seconds  Estimated Blood Loss:  Estimated blood loss: none. Estimated blood loss: none.      [facility]

## 2022-09-21 ENCOUNTER — Encounter: Payer: Self-pay | Admitting: Gastroenterology

## 2022-10-02 ENCOUNTER — Ambulatory Visit: Payer: Medicaid Other | Admitting: Gastroenterology

## 2022-10-09 DIAGNOSIS — G4733 Obstructive sleep apnea (adult) (pediatric): Secondary | ICD-10-CM | POA: Diagnosis not present

## 2022-10-11 DIAGNOSIS — G4733 Obstructive sleep apnea (adult) (pediatric): Secondary | ICD-10-CM | POA: Diagnosis not present

## 2022-10-18 ENCOUNTER — Other Ambulatory Visit: Payer: Self-pay | Admitting: Family Medicine

## 2022-10-18 NOTE — Telephone Encounter (Signed)
[facility] called and spoke to [place], [employer] about the refill(s) Celexa requested. Advised it was sent on 08/17/22 #90/1 refill(s). Carlyon Shadow stated that they have the Rx ready for pick-up but due to insurance the patient will not be eligible to pick-up until October 20, 2022. At that time pharmacy will inform patient Rx ready for pick up.

## 2022-10-18 NOTE — Telephone Encounter (Signed)
Too soon Requested Prescriptions  Pending Prescriptions Disp Refills   citalopram (CELEXA) 20 MG tablet [Pharmacy Med Name: CITALOPRAM 20MG  TABLETS] 90 tablet 1    Sig: TAKE 1 TABLET(20 MG) BY MOUTH DAILY     Psychiatry:  Antidepressants - SSRI Passed - 10/18/2022  3:34 AM      Passed - Completed PHQ-2 or PHQ-9 in the last 360 days      Passed - Valid encounter within last 6 months    Recent Outpatient Visits           2 months ago Gastroesophageal reflux disease, unspecified whether esophagitis present   [place] [facility] [place], Megan P, DO   2 months ago Epigastric pain   [place] [facility] [place], Megan P, DO   3 months ago Chronic hepatitis C without hepatic coma (HCC)   [place] [facility] [place], [place] T, NP   5 months ago Acute non-recurrent maxillary sinusitis   [place] [facility] [place], Megan P, DO   5 months ago 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 [facility])   [place] [facility] Dorcas Carrow, DO       Future Appointments             In 3 months Laural Benes, Oralia Rud, DO [facility]

## 2022-10-19 NOTE — Telephone Encounter (Signed)
Unable to refill per protocol, Rx request is too soon, duplicate request.  Requested Prescriptions  Pending Prescriptions Disp Refills   citalopram (CELEXA) 20 MG tablet [Pharmacy Med Name: CITALOPRAM 20MG  TABLETS] 90 tablet 1    Sig: TAKE 1 TABLET(20 MG) BY MOUTH DAILY     Psychiatry:  Antidepressants - SSRI Passed - 10/18/2022  3:09 PM      Passed - Completed PHQ-2 or PHQ-9 in the last 360 days      Passed - Valid encounter within last 6 months    Recent Outpatient Visits           2 months ago Gastroesophageal reflux disease, unspecified whether esophagitis present   [place] [facility] [place], Megan P, DO   2 months ago Epigastric pain   [place] [facility] [place], Megan P, DO   3 months ago Chronic hepatitis C without hepatic coma (HCC)   [place] [facility] [place], [place] T, NP   5 months ago Acute non-recurrent maxillary sinusitis   [place] [facility] [place], Megan P, DO   5 months ago 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 [facility])   [place] [facility] [place], Oralia Rud, DO       Future Appointments             In 3 months Laural Benes, Oralia Rud, DO [place] [facility]

## 2022-10-20 ENCOUNTER — Ambulatory Visit (INDEPENDENT_AMBULATORY_CARE_PROVIDER_SITE_OTHER): Payer: Medicaid Other

## 2022-10-20 ENCOUNTER — Ambulatory Visit
Admission: EM | Admit: 2022-10-20 | Discharge: 2022-10-20 | Disposition: A | Discharge status: Home / Self Care | Payer: Medicaid Other | Attending: Family Medicine | Admitting: Family Medicine

## 2022-10-20 VITALS — BP 106/71 | HR 88 | Temp 98.5°F | Resp 18 | Ht 63.0 in | Wt 188.1 lb

## 2022-10-20 DIAGNOSIS — R350 Frequency of micturition: Secondary | ICD-10-CM | POA: Diagnosis not present

## 2022-10-20 DIAGNOSIS — M25552 Pain in left hip: Secondary | ICD-10-CM

## 2022-10-20 DIAGNOSIS — M545 Low back pain, unspecified: Secondary | ICD-10-CM | POA: Diagnosis not present

## 2022-10-20 DIAGNOSIS — W19XXXA Unspecified fall, initial encounter: Secondary | ICD-10-CM

## 2022-10-20 DIAGNOSIS — M25562 Pain in left knee: Secondary | ICD-10-CM | POA: Diagnosis not present

## 2022-10-20 DIAGNOSIS — M1712 Unilateral primary osteoarthritis, left knee: Secondary | ICD-10-CM | POA: Diagnosis not present

## 2022-10-20 DIAGNOSIS — M47816 Spondylosis without myelopathy or radiculopathy, lumbar region: Secondary | ICD-10-CM | POA: Diagnosis not present

## 2022-10-20 DIAGNOSIS — M1612 Unilateral primary osteoarthritis, left hip: Secondary | ICD-10-CM | POA: Diagnosis not present

## 2022-10-20 LAB — URINALYSIS, MICROSCOPIC (REFLEX)

## 2022-10-20 LAB — URINALYSIS, ROUTINE W REFLEX MICROSCOPIC
Bilirubin Urine: NEGATIVE
Glucose, UA: NEGATIVE mg/dL
Ketones, ur: NEGATIVE mg/dL
Leukocytes,Ua: NEGATIVE
Nitrite: NEGATIVE
Protein, ur: 100 mg/dL — AB
Specific Gravity, Urine: 1.015 (ref 1.005–1.030)
pH: 5.5 (ref 5.0–8.0)

## 2022-10-20 MED ORDER — PREDNISONE 10 MG (21) PO TBPK: ORAL_TABLET | Freq: Every day | ORAL | 0 refills | Status: DC

## 2022-10-20 MED ORDER — KETOROLAC TROMETHAMINE 60 MG/2ML IM SOLN
30.0000 mg | Freq: Once | INTRAMUSCULAR | Status: AC
  Administered 2022-10-20: 30 mg via INTRAMUSCULAR

## 2022-10-20 MED ORDER — NAPROXEN 500 MG PO TABS: 500.0000 mg | ORAL_TABLET | Freq: Two times a day (BID) | ORAL | 0 refills | Status: DC

## 2022-10-20 MED ORDER — METHOCARBAMOL 500 MG PO TABS: 500.0000 mg | ORAL_TABLET | Freq: Two times a day (BID) | ORAL | 0 refills | Status: DC

## 2022-10-20 NOTE — Discharge Instructions (Addendum)
If medication was prescribed, stop by the pharmacy to pick up your prescriptions.  For your  pain, Take 1500 mg Tylenol twice a day, take muscle relaxer (Robaxin/methocarbamol) twice a day, take Naprosyn twice a day,  as needed for pain.  Take steroids as prescribed.  Apply warm compresses intermittently, as needed.  As pain recedes, begin normal activities slowly as tolerated.  Follow up with primary care provider or an orthopedic provider, if symptoms persist.  Watch for worsening symptoms such as an increasing weakness or loss of sensation, increasing pain and/or the loss of bladder or bowel function. Should any of these occur, go to the emergency department immediately.

## 2022-10-20 NOTE — ED Triage Notes (Signed)
Pt states she fell twice about a week ago. She states she has pain in her left knee and lower back. She states she tried ibuprofen without relief.

## 2022-10-20 NOTE — ED Provider Notes (Signed)
[facility]    CSN: [id number] Arrival date & time: 10/20/22  1152      History   Chief Complaint Chief Complaint  Patient presents with   Fall    HPI  HPI Katie Stanley is a 37 y.o. female.   Katie Stanley presents  after falling twice on Saturday. 'I got real dizzy-lightheaded and just went down." This is not a new thing for her though. Has issues with her electrolytes.  A plastic bookcase that fell on top of her. Has left knee pain and back pain. Pain radiates to her left hip.  Has stabbing pain on the "insides." Has pain with walking. No saddle paresthesia, urinary or bowel incontinence. Notes urinary frequency.  No vomiting, diarrhea, fever, headache. She did not hit her head or pass out.   Did not hear any pop or abnormal sounds with this injury though when he lays down her hip pops.  Continues to have stabbing and shooting pain when moving.. Denies redness, swelling. Does feel like her her legs are weak. Has tried ibuprofen with little relief.      Past Medical History:  Diagnosis Date   Anxiety    Biliary calculi 12/07/2009   Cholelithiasis    Depression    GERD (gastroesophageal reflux disease)    Hepatitis C    body "self healed" per patient   Palpitations    Retained intrauterine contraceptive device (IUD) 01/20/2020   Substance abuse (HCC)     Patient Active Problem List   Diagnosis Date Noted   Diarrhea 09/20/2022   Polyp of transverse colon 09/20/2022   Hypokalemia due to excessive gastrointestinal loss of potassium 06/21/2022   Hepatic steatosis 04/28/2022   Vitamin D deficiency 04/28/2022   History of cardiac arrest 04/28/2022   Primary hypertension 05/16/2021   Alcohol abuse 12/31/2020   Paroxysmal tachycardia (HCC) 07/22/2019   Morbid obesity (HCC) 04/29/2018   Gastroesophageal reflux disease 12/25/2017   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HCC) 05/01/2016   Anxiety 02/25/2015   Depression, recurrent (HCC) 02/25/2015   Chronic  hepatitis C virus infection (HCC) 07/07/2009    Past Surgical History:  Procedure Laterality Date   CHOLECYSTECTOMY     2011   COLONOSCOPY WITH PROPOFOL N/A 09/20/2022   Procedure: COLONOSCOPY WITH PROPOFOL;  Surgeon: Midge Minium, MD;  Location[facility] END[facility]COPY;  Service: Endoscopy;  Laterality: N/A;   ESOPHAGOGASTRODUODENOSCOPY N/A 09/29/2021   Procedure: ESOPHAGOGASTRODUODENOSCOPY (EGD);  Surgeon: Midge Minium, MD;  Location: [facility] ENDOSCOPY;  Service: Endoscopy;  Laterality: N/A;   ESOPHAGOGASTRODUODENOSCOPY (EGD) WITH PROPOFOL N/A 01/21/2018   Procedure: ESOPHAGOGASTRODUODENOSCOPY (EGD) WITH Biopsy;  Surgeon: Midge Minium, MD;  Location: [facility];  Service: Endoscopy;  Laterality: N/A;   HYSTEROSCOPY WITH D & C N/A 01/20/2020   Procedure: DILATATION AND CURETTAGE /HYSTEROSCOPY;  Surgeon: Conard Novak, MD;  Location: [facility] ORS;  Service: Gynecology;  Laterality: N/A;   INTRAUTERINE DEVICE (IUD) INSERTION N/A 01/20/2020   Procedure: INTRAUTERINE DEVICE (IUD) INSERTION MIRENA IUD;  Surgeon: Conard Novak, MD;  Location: [facility] ORS;  Service: Gynecology;  Laterality: N/A;   IUD REMOVAL N/A 01/20/2020   Procedure: INTRAUTERINE DEVICE (IUD) REMOVAL;  Surgeon: Conard Novak, MD;  Location: [facility] ORS;  Service: Gynecology;  Laterality: N/A;   PORT-A-CATH REMOVAL  02/08/2022   PORTA CATH INSERTION N/A 01/01/2019   Procedure: PORTA CATH INSERTION;  Surgeon: Annice Needy, MD;  Location: [facility] INVASIVE CV LAB;  Service: Cardiovascular;  Laterality: N/A;   VASCULAR SURGERY  OB History   No obstetric history on file.      Home Medications    Prior to Admission medications   Medication Sig Start Date End Date Taking? Authorizing Provider  citalopram (CELEXA) 20 MG tablet TAKE 1 TABLET(20 MG) BY MOUTH DAILY 08/17/22  Yes Johnson, Megan P, DO  lisinopril (ZESTRIL) 10 MG tablet Take 1 tablet (10 mg total) by mouth daily. 08/17/22  Yes Johnson, Megan P, DO  Magnesium 250 MG  TABS Take 1 tablet (250 mg total) by mouth 2 (two) times daily. 07/11/22  Yes Cannady, Jolene T, NP  metoprolol succinate (TOPROL-XL) 25 MG 24 hr tablet Take 1 tablet (25 mg total) by mouth daily. 08/17/22  Yes Johnson, Megan P, DO  pantoprazole (PROTONIX) 40 MG tablet Take 2 tablets (80 mg total) by mouth daily. 08/02/22  Yes Johnson, Megan P, DO  potassium chloride SA (KLOR-CON M) 20 MEQ tablet Take 1 tablet (20 mEq total) by mouth daily. 07/11/22  Yes Cannady, Jolene T, NP  sucralfate (CARAFATE) 1 GM/10ML suspension Take 10 mLs (1 g total) by mouth 4 (four) times daily -  with meals and at bedtime. 08/17/22  Yes Johnson, Megan P, DO  lidocaine (LIDODERM) 5 % Place 1 patch onto the skin daily. Remove & Discard patch within 12 hours or as directed by MD 04/28/22   Olevia Perches P, DO  meloxicam (MOBIC) 7.5 MG tablet Take 7.5 mg by mouth daily.    [provider]  naltrexone (DEPADE) 50 MG tablet Take 1 tablet (50 mg total) by mouth daily. Do not take any opiods with this medicine or you will go into withdrawal 08/17/22   Laural Benes, Megan P, DO  Naphazoline HCl (CLEAR EYES OP) Place 1 drop into both eyes daily as needed (itching).    [provider]    Family History Family History  Problem Relation Age of Onset   Hypertension Mother    Diabetes Father    Hypertension Father    Liver cancer Maternal Grandmother    Hypertension Maternal Grandfather    Breast cancer Paternal Grandmother    Heart disease Paternal Grandfather     Social History Social History   Tobacco Use   Smoking status: Every Day    Current packs/day: 1.00    Average packs/day: 1 pack/day for 12.0 years (12.0 ttl pk-yrs)    Types: Cigarettes   Smokeless tobacco: Never  Vaping Use   Vaping status: Never Used  Substance Use Topics   Alcohol use: Yes    Comment: 6 BEERS SOME DAY, 20 shots per day, had some beers6/21/2023   Drug use: Yes    Types: Marijuana    Comment: 09/28/2021     Allergies    Penicillins   Review of Systems Review of Systems: :negative unless otherwise stated in HPI.      Physical Exam Triage Vital Signs ED Triage Vitals  Encounter Vitals Group     BP 10/20/22 1213 106/71     Systolic BP Percentile --      Diastolic BP Percentile --      Pulse Rate 10/20/22 1213 88     Resp 10/20/22 1213 18     Temp 10/20/22 1213 98.5 F (36.9 C)     Temp Source 10/20/22 1213 Oral     SpO2 10/20/22 1213 94 %     Weight 10/20/22 1211 188 lb 0.8 oz (85.3 kg)     Height 10/20/22 1211 5\' 3"  (1.6 m)  Head Circumference --      Peak Flow --      Pain Score 10/20/22 1210 9     Pain Loc --      Pain Education --      Exclude from Growth Chart --    No data found.  Updated Vital Signs BP 106/71 (BP Location: Left Arm)   Pulse 88   Temp 98.5 F (36.9 C) (Oral)   Resp 18   Ht 5\' 3"  (1.6 m)   Wt 85.3 kg   SpO2 94%   BMI 33.31 kg/m   Visual Acuity Right Eye Distance:   Left Eye Distance:   Bilateral Distance:    Right Eye Near:   Left Eye Near:    Bilateral Near:     Physical Exam GEN: Alert, female in no acute distress  EYES: Extraocular movements intact, pupils equal round and reactive to light HENT: Moist mucous membranes, no oropharyngeal lesions, no blood visble, no hemotympanum, no hematoma NECK: Normal range of motion, no midline cervical spinous tenderness, or paraspinal tenderness bilaterally CV: regular rate and rhythm, no chest wall trauma RESP: no increased work of breathing, clear to ascultation bilaterally MSK:  -Thoracic and lumbar spine: no T-spine tenderness but has lower lumbar  spinous process tenderness and paraspinal tenderness bilaterally  -Left Knee Exam -Inspection: no deformity, ecchymosis -Palpation: lateral joint line tenderness -ROM: Full ROM -Special Tests: Varus Stress: Negative; Valgus Stress: Negative; Anterior: Negative; Posterior drawer: Negative; Thessaly: not attempted; Patellar grind: positive  -Left hip:  Normal range of motion, + iliac crest tenderness, pelvis stable, +FADIR, No deformity, erythema, ecchymosis, or edema. Good full in all directions; Strength 5/5 in IR/ER/Flex/Ext/Abd/Add. Pelvic alignment unremarkable to inspection and palpation. Antalgic gait . Greater trochanter without tenderness to palpation.    Limb neurovascularly intact, no instability noted   SKIN: warm, dry, scattered ecchymosis on left lower extremity      UC Treatments / Results  Labs (all labs ordered are listed, but only abnormal results are displayed) Labs Reviewed - No data to display  EKG   Radiology No results found.   Procedures Procedures (including critical care time)  Medications Ordered in UC Medications - No data to display  Initial Impression / Assessment and Plan / UC Course  I have reviewed the triage vital signs and the nursing notes.  Pertinent labs & imaging results that were available during my care of the patient were reviewed by me and considered in my medical decision making (see chart for details).      Pt is a 37 y.o.  female with history of alcohol presents after 2 falls in the past week. Has low back pain, left hip pain and left knee pain.   On exam, pt has tenderness at left illiac creast, lower L-spine midline tenderness and lateral joint line tenderness of knee.  IUD placed in 2021. Obtained left hip, left knee and lumbar plain films.  Personally interpreted by me were ***unremarkable for fracture or dislocation. Radiologist report reviewed and additionally notes *** no soft tissue swelling.  Given Toradol IM ***/sling/brace/crutches  Patient to gradually return to normal activities, as tolerated and continue ordinary activities within the limits permitted by pain. Prescribed Naproxen sodium *** and muscle relaxer *** for pain relief.  Tylenol PRN. Advised patient to avoid OTC NSAIDs while taking prescription NSAID. Counseled patient on red flag symptoms and when to seek  immediate care.  ***No red flags such as progressive major motor weakness.  Patient to follow up with orthopedic provider, if symptoms do not improve with conservative treatment.  Return and ED precautions given. Understanding voiced. Discussed MDM, treatment plan and plan for follow-up with patient/parent who agrees with plan.   Final Clinical Impressions(s) / UC Diagnoses   Final diagnoses:  None   Discharge Instructions   None    ED Prescriptions   None    PDMP not reviewed this encounter.

## 2022-10-27 ENCOUNTER — Other Ambulatory Visit: Payer: Self-pay

## 2022-10-27 ENCOUNTER — Emergency Department: Payer: Medicaid Other

## 2022-10-27 ENCOUNTER — Inpatient Hospital Stay
Admission: EM | Admit: 2022-10-27 | Discharge: 2022-11-01 | DRG: 641 | Disposition: A | Discharge status: Home / Self Care | Payer: Medicaid Other | Attending: Internal Medicine | Admitting: Internal Medicine

## 2022-10-27 ENCOUNTER — Inpatient Hospital Stay: Payer: Medicaid Other

## 2022-10-27 DIAGNOSIS — E860 Dehydration: Secondary | ICD-10-CM | POA: Diagnosis present

## 2022-10-27 DIAGNOSIS — R109 Unspecified abdominal pain: Secondary | ICD-10-CM

## 2022-10-27 DIAGNOSIS — Z716 Tobacco abuse counseling: Secondary | ICD-10-CM

## 2022-10-27 DIAGNOSIS — Z803 Family history of malignant neoplasm of breast: Secondary | ICD-10-CM

## 2022-10-27 DIAGNOSIS — Z833 Family history of diabetes mellitus: Secondary | ICD-10-CM

## 2022-10-27 DIAGNOSIS — R9430 Abnormal result of cardiovascular function study, unspecified: Secondary | ICD-10-CM | POA: Diagnosis not present

## 2022-10-27 DIAGNOSIS — F17210 Nicotine dependence, cigarettes, uncomplicated: Secondary | ICD-10-CM | POA: Diagnosis present

## 2022-10-27 DIAGNOSIS — F1911 Other psychoactive substance abuse, in remission: Secondary | ICD-10-CM | POA: Diagnosis present

## 2022-10-27 DIAGNOSIS — R7989 Other specified abnormal findings of blood chemistry: Secondary | ICD-10-CM | POA: Diagnosis not present

## 2022-10-27 DIAGNOSIS — Z79899 Other long term (current) drug therapy: Secondary | ICD-10-CM

## 2022-10-27 DIAGNOSIS — K429 Umbilical hernia without obstruction or gangrene: Secondary | ICD-10-CM | POA: Diagnosis not present

## 2022-10-27 DIAGNOSIS — K869 Disease of pancreas, unspecified: Secondary | ICD-10-CM | POA: Diagnosis present

## 2022-10-27 DIAGNOSIS — E8729 Other acidosis: Secondary | ICD-10-CM | POA: Diagnosis present

## 2022-10-27 DIAGNOSIS — E876 Hypokalemia: Secondary | ICD-10-CM | POA: Diagnosis not present

## 2022-10-27 DIAGNOSIS — N390 Urinary tract infection, site not specified: Secondary | ICD-10-CM

## 2022-10-27 DIAGNOSIS — G4710 Hypersomnia, unspecified: Secondary | ICD-10-CM | POA: Diagnosis not present

## 2022-10-27 DIAGNOSIS — Z1152 Encounter for screening for COVID-19: Secondary | ICD-10-CM

## 2022-10-27 DIAGNOSIS — Z880 Allergy status to penicillin: Secondary | ICD-10-CM

## 2022-10-27 DIAGNOSIS — Z8249 Family history of ischemic heart disease and other diseases of the circulatory system: Secondary | ICD-10-CM | POA: Diagnosis not present

## 2022-10-27 DIAGNOSIS — R000 Tachycardia, unspecified: Secondary | ICD-10-CM | POA: Diagnosis not present

## 2022-10-27 DIAGNOSIS — R350 Frequency of micturition: Secondary | ICD-10-CM | POA: Diagnosis present

## 2022-10-27 DIAGNOSIS — K760 Fatty (change of) liver, not elsewhere classified: Secondary | ICD-10-CM | POA: Diagnosis not present

## 2022-10-27 DIAGNOSIS — F419 Anxiety disorder, unspecified: Secondary | ICD-10-CM | POA: Diagnosis present

## 2022-10-27 DIAGNOSIS — Z800 Family history of malignant neoplasm of digestive organs: Secondary | ICD-10-CM

## 2022-10-27 DIAGNOSIS — F32A Depression, unspecified: Secondary | ICD-10-CM | POA: Diagnosis present

## 2022-10-27 DIAGNOSIS — R200 Anesthesia of skin: Secondary | ICD-10-CM | POA: Diagnosis not present

## 2022-10-27 DIAGNOSIS — K7689 Other specified diseases of liver: Secondary | ICD-10-CM | POA: Diagnosis not present

## 2022-10-27 DIAGNOSIS — Z8674 Personal history of sudden cardiac arrest: Secondary | ICD-10-CM

## 2022-10-27 DIAGNOSIS — F1010 Alcohol abuse, uncomplicated: Secondary | ICD-10-CM | POA: Diagnosis present

## 2022-10-27 DIAGNOSIS — R931 Abnormal findings on diagnostic imaging of heart and coronary circulation: Secondary | ICD-10-CM | POA: Diagnosis present

## 2022-10-27 DIAGNOSIS — R002 Palpitations: Secondary | ICD-10-CM | POA: Diagnosis present

## 2022-10-27 DIAGNOSIS — I119 Hypertensive heart disease without heart failure: Secondary | ICD-10-CM | POA: Diagnosis present

## 2022-10-27 DIAGNOSIS — I10 Essential (primary) hypertension: Secondary | ICD-10-CM | POA: Diagnosis present

## 2022-10-27 DIAGNOSIS — R9431 Abnormal electrocardiogram [ECG] [EKG]: Secondary | ICD-10-CM | POA: Diagnosis present

## 2022-10-27 DIAGNOSIS — N179 Acute kidney failure, unspecified: Secondary | ICD-10-CM | POA: Diagnosis present

## 2022-10-27 DIAGNOSIS — I2489 Other forms of acute ischemic heart disease: Secondary | ICD-10-CM | POA: Diagnosis present

## 2022-10-27 DIAGNOSIS — R079 Chest pain, unspecified: Secondary | ICD-10-CM | POA: Diagnosis not present

## 2022-10-27 DIAGNOSIS — R300 Dysuria: Secondary | ICD-10-CM | POA: Diagnosis present

## 2022-10-27 DIAGNOSIS — K219 Gastro-esophageal reflux disease without esophagitis: Secondary | ICD-10-CM | POA: Diagnosis present

## 2022-10-27 DIAGNOSIS — Z9049 Acquired absence of other specified parts of digestive tract: Secondary | ICD-10-CM

## 2022-10-27 LAB — URINALYSIS, COMPLETE (UACMP) WITH MICROSCOPIC
Bilirubin Urine: NEGATIVE
Glucose, UA: NEGATIVE mg/dL
Hgb urine dipstick: NEGATIVE
Ketones, ur: NEGATIVE mg/dL
Leukocytes,Ua: NEGATIVE
Nitrite: NEGATIVE
Protein, ur: 30 mg/dL — AB
Specific Gravity, Urine: 1.013 (ref 1.005–1.030)
pH: 6 (ref 5.0–8.0)

## 2022-10-27 LAB — TROPONIN I (HIGH SENSITIVITY)
Troponin I (High Sensitivity): 79 ng/L — ABNORMAL HIGH (ref <18)
Troponin I (High Sensitivity): 83 ng/L — ABNORMAL HIGH (ref <18)
Troponin I (High Sensitivity): 75 ng/L — ABNORMAL HIGH (ref <18)

## 2022-10-27 LAB — BASIC METABOLIC PANEL
Anion gap: 26 — ABNORMAL HIGH (ref 5–15)
Anion gap: 20 — ABNORMAL HIGH (ref 5–15)
BUN: 25 mg/dL — ABNORMAL HIGH (ref 6–20)
BUN: 24 mg/dL — ABNORMAL HIGH (ref 6–20)
CO2: 28 mmol/L (ref 22–32)
CO2: 35 mmol/L — ABNORMAL HIGH (ref 22–32)
Calcium: 9 mg/dL (ref 8.9–10.3)
Calcium: 8.6 mg/dL — ABNORMAL LOW (ref 8.9–10.3)
Chloride: 78 mmol/L — ABNORMAL LOW (ref 98–111)
Chloride: 76 mmol/L — ABNORMAL LOW (ref 98–111)
Creatinine, Ser: 1.28 mg/dL — ABNORMAL HIGH (ref 0.44–1.00)
Creatinine, Ser: 1.31 mg/dL — ABNORMAL HIGH (ref 0.44–1.00)
GFR, Estimated: 55 mL/min — ABNORMAL LOW (ref >60)
GFR, Estimated: 54 mL/min — ABNORMAL LOW (ref >60)
Glucose, Bld: 142 mg/dL — ABNORMAL HIGH (ref 70–99)
Glucose, Bld: 138 mg/dL — ABNORMAL HIGH (ref 70–99)
Potassium: <2 mmol/L — CL (ref 3.5–5.1)
Potassium: <2 mmol/L — CL (ref 3.5–5.1)
Sodium: 132 mmol/L — ABNORMAL LOW (ref 135–145)
Sodium: 131 mmol/L — ABNORMAL LOW (ref 135–145)

## 2022-10-27 LAB — LIPID PANEL
Cholesterol: 200 mg/dL (ref 0–200)
HDL: 99 mg/dL (ref >40)
LDL Cholesterol: 81 mg/dL (ref 0–99)
Total CHOL/HDL Ratio: 2 RATIO
Triglycerides: 102 mg/dL (ref <150)
VLDL: 20 mg/dL (ref 0–40)

## 2022-10-27 LAB — URINE DRUG SCREEN, QUALITATIVE (ARMC ONLY)
Amphetamines, Ur Screen: NOT DETECTED
Barbiturates, Ur Screen: NOT DETECTED
Benzodiazepine, Ur Scrn: NOT DETECTED
Cannabinoid 50 Ng, Ur ~~LOC~~: POSITIVE — AB
Cocaine Metabolite,Ur ~~LOC~~: NOT DETECTED
MDMA (Ecstasy)Ur Screen: NOT DETECTED
Methadone Scn, Ur: NOT DETECTED
Opiate, Ur Screen: POSITIVE — AB
Phencyclidine (PCP) Ur S: NOT DETECTED
Tricyclic, Ur Screen: POSITIVE — AB

## 2022-10-27 LAB — CBC WITH DIFFERENTIAL/PLATELET
Abs Immature Granulocytes: 0.13 10*3/uL — ABNORMAL HIGH (ref 0.00–0.07)
Basophils Absolute: 0.1 10*3/uL (ref 0.0–0.1)
Basophils Relative: 0 %
Eosinophils Absolute: 0.1 10*3/uL (ref 0.0–0.5)
Eosinophils Relative: 0 %
HCT: 36.6 % (ref 36.0–46.0)
Hemoglobin: 12.9 g/dL (ref 12.0–15.0)
Immature Granulocytes: 1 %
Lymphocytes Relative: 9 %
Lymphs Abs: 1.9 10*3/uL (ref 0.7–4.0)
MCH: 31.3 pg (ref 26.0–34.0)
MCHC: 35.2 g/dL (ref 30.0–36.0)
MCV: 88.8 fL (ref 80.0–100.0)
Monocytes Absolute: 1.3 10*3/uL — ABNORMAL HIGH (ref 0.1–1.0)
Monocytes Relative: 6 %
Neutro Abs: 17.4 10*3/uL — ABNORMAL HIGH (ref 1.7–7.7)
Neutrophils Relative %: 84 %
Platelets: 337 10*3/uL (ref 150–400)
RBC: 4.12 MIL/uL (ref 3.87–5.11)
RDW: 14.2 % (ref 11.5–15.5)
WBC: 20.8 10*3/uL — ABNORMAL HIGH (ref 4.0–10.5)
nRBC: 0 % (ref 0.0–0.2)

## 2022-10-27 LAB — HEPATIC FUNCTION PANEL
ALT: 37 U/L (ref 0–44)
AST: 64 U/L — ABNORMAL HIGH (ref 15–41)
Albumin: 3.2 g/dL — ABNORMAL LOW (ref 3.5–5.0)
Alkaline Phosphatase: 202 U/L — ABNORMAL HIGH (ref 38–126)
Bilirubin, Direct: 1.4 mg/dL — ABNORMAL HIGH (ref 0.0–0.2)
Indirect Bilirubin: 2.2 mg/dL — ABNORMAL HIGH (ref 0.3–0.9)
Total Bilirubin: 3.6 mg/dL — ABNORMAL HIGH (ref 0.3–1.2)
Total Protein: 6.8 g/dL (ref 6.5–8.1)

## 2022-10-27 LAB — POTASSIUM
Potassium: <2 mmol/L — CL (ref 3.5–5.1)
Potassium: <2 mmol/L — CL (ref 3.5–5.1)

## 2022-10-27 LAB — MAGNESIUM: Magnesium: 1.4 mg/dL — ABNORMAL LOW (ref 1.7–2.4)

## 2022-10-27 LAB — POC URINE PREG, ED: Preg Test, Ur: NEGATIVE

## 2022-10-27 MED ORDER — MAGNESIUM SULFATE 50 % IJ SOLN
3.0000 g | Freq: Once | INTRAVENOUS | Status: AC
  Administered 2022-10-27: 3 g via INTRAVENOUS
  Filled 2022-10-27: qty 6

## 2022-10-27 MED ORDER — THIAMINE HCL 100 MG/ML IJ SOLN
100.0000 mg | Freq: Every day | INTRAMUSCULAR | Status: DC
  Filled 2022-10-27: qty 1

## 2022-10-27 MED ORDER — ONDANSETRON HCL 4 MG/2ML IJ SOLN
4.0000 mg | Freq: Once | INTRAMUSCULAR | Status: AC
  Administered 2022-10-27: 4 mg via INTRAVENOUS
  Filled 2022-10-27: qty 2

## 2022-10-27 MED ORDER — PANTOPRAZOLE SODIUM 40 MG IV SOLR
40.0000 mg | Freq: Two times a day (BID) | INTRAVENOUS | Status: DC
  Administered 2022-10-27: 40 mg via INTRAVENOUS
  Filled 2022-10-27: qty 10

## 2022-10-27 MED ORDER — POTASSIUM CHLORIDE 10 MEQ/100ML IV SOLN
10.0000 meq | INTRAVENOUS | Status: AC
  Administered 2022-10-27 (×3): 10 meq via INTRAVENOUS
  Filled 2022-10-27 (×4): qty 100

## 2022-10-27 MED ORDER — LORAZEPAM 1 MG PO TABS
0.0000 mg | ORAL_TABLET | Freq: Four times a day (QID) | ORAL | Status: AC
  Administered 2022-10-27 – 2022-10-29 (×5): 1 mg via ORAL
  Filled 2022-10-27 (×6): qty 1

## 2022-10-27 MED ORDER — LORAZEPAM 2 MG/ML IJ SOLN
1.0000 mg | Freq: Once | INTRAMUSCULAR | Status: AC
  Administered 2022-10-27: 1 mg via INTRAVENOUS
  Filled 2022-10-27: qty 1

## 2022-10-27 MED ORDER — POTASSIUM CHLORIDE 10 MEQ/100ML IV SOLN
10.0000 meq | Freq: Once | INTRAVENOUS | Status: AC
  Administered 2022-10-27: 10 meq via INTRAVENOUS
  Filled 2022-10-27: qty 100

## 2022-10-27 MED ORDER — MAGNESIUM SULFATE IN D5W 1-5 GM/100ML-% IV SOLN
1.0000 g | Freq: Once | INTRAVENOUS | Status: AC
  Administered 2022-10-27: 1 g via INTRAVENOUS
  Filled 2022-10-27: qty 100

## 2022-10-27 MED ORDER — LACTATED RINGERS IV BOLUS
1000.0000 mL | Freq: Once | INTRAVENOUS | Status: AC
  Administered 2022-10-27: 1000 mL via INTRAVENOUS

## 2022-10-27 MED ORDER — POTASSIUM CHLORIDE IN NACL 40-0.9 MEQ/L-% IV SOLN
INTRAVENOUS | Status: DC
  Filled 2022-10-27 (×8): qty 1000

## 2022-10-27 MED ORDER — ASPIRIN 81 MG PO CHEW
81.0000 mg | CHEWABLE_TABLET | Freq: Every day | ORAL | Status: DC
  Administered 2022-10-27 – 2022-11-01 (×6): 81 mg via ORAL
  Filled 2022-10-27 (×5): qty 1

## 2022-10-27 MED ORDER — LORAZEPAM 2 MG/ML IJ SOLN: 0.0000 mg | Freq: Two times a day (BID) | INTRAMUSCULAR | Status: AC

## 2022-10-27 MED ORDER — MAGNESIUM SULFATE 50 % IJ SOLN
1.0000 g | Freq: Once | INTRAMUSCULAR | Status: DC
  Filled 2022-10-27: qty 2

## 2022-10-27 MED ORDER — PANTOPRAZOLE SODIUM 40 MG IV SOLR
40.0000 mg | Freq: Two times a day (BID) | INTRAVENOUS | Status: DC
  Administered 2022-10-28 – 2022-10-30 (×5): 40 mg via INTRAVENOUS
  Filled 2022-10-27 (×5): qty 10

## 2022-10-27 MED ORDER — THIAMINE MONONITRATE 100 MG PO TABS
100.0000 mg | ORAL_TABLET | Freq: Every day | ORAL | Status: DC
  Administered 2022-10-27 – 2022-11-01 (×6): 100 mg via ORAL
  Filled 2022-10-27 (×5): qty 1

## 2022-10-27 MED ORDER — LORAZEPAM 1 MG PO TABS
0.0000 mg | ORAL_TABLET | Freq: Two times a day (BID) | ORAL | Status: AC
  Administered 2022-10-29 – 2022-10-30 (×2): 1 mg via ORAL
  Filled 2022-10-27 (×2): qty 1

## 2022-10-27 MED ORDER — POTASSIUM CHLORIDE 10 MEQ/100ML IV SOLN
10.0000 meq | INTRAVENOUS | Status: AC
  Administered 2022-10-27 – 2022-10-28 (×4): 10 meq via INTRAVENOUS
  Filled 2022-10-27 (×3): qty 100

## 2022-10-27 MED ORDER — DEXTROSE 5 % IV SOLN
3.0000 g | Freq: Once | INTRAVENOUS | Status: DC
Start: 2022-10-27 — End: 2022-10-27

## 2022-10-27 MED ORDER — POTASSIUM CHLORIDE CRYS ER 20 MEQ PO TBCR
40.0000 meq | EXTENDED_RELEASE_TABLET | Freq: Once | ORAL | Status: AC
  Administered 2022-10-27: 40 meq via ORAL
  Filled 2022-10-27: qty 2

## 2022-10-27 MED ORDER — ENOXAPARIN SODIUM 40 MG/0.4ML IJ SOSY
40.0000 mg | PREFILLED_SYRINGE | INTRAMUSCULAR | Status: DC
  Administered 2022-10-27 – 2022-10-31 (×5): 40 mg via SUBCUTANEOUS
  Filled 2022-10-27 (×5): qty 0.4

## 2022-10-27 MED ORDER — ONDANSETRON HCL 4 MG/2ML IJ SOLN
4.0000 mg | Freq: Four times a day (QID) | INTRAMUSCULAR | Status: DC | PRN
  Administered 2022-10-28: 4 mg via INTRAVENOUS
  Filled 2022-10-27: qty 2

## 2022-10-27 MED ORDER — ONDANSETRON HCL 4 MG PO TABS: 4.0000 mg | ORAL_TABLET | Freq: Four times a day (QID) | ORAL | Status: DC | PRN

## 2022-10-27 MED ORDER — LORAZEPAM 2 MG/ML IJ SOLN
0.0000 mg | Freq: Four times a day (QID) | INTRAMUSCULAR | Status: AC
  Administered 2022-10-28: 2 mg via INTRAVENOUS
  Administered 2022-10-28: 1 mg via INTRAVENOUS
  Filled 2022-10-27 (×2): qty 1

## 2022-10-27 NOTE — Progress Notes (Signed)
Called IV team. No answer, unable to leave message.

## 2022-10-27 NOTE — Progress Notes (Signed)
Prolonged QT preliminarily discussed with on-call cardiologist PA Cadence Casimiro Needle to monitor for now Repeat EKG once potassium is repleted Trend troponin Start heparin drip if significant uptrend Otherwise continue with restratification and 2D echo in the morning Monitor closely

## 2022-10-27 NOTE — Assessment & Plan Note (Signed)
Creatinine 1.28 with a GFR in the 50s Likely prerenal etiology in the setting of intractable nausea and vomiting IV fluid hydration Hold offending agents Monitor for now

## 2022-10-27 NOTE — Assessment & Plan Note (Addendum)
Trop 70s on presentation in setting of ETOH abuse, nausea/vomiting/, ETOH abuse  Noted prolonged QT in setting of hypokalemia  No active CP  Suspect minimal to mild demand ischemia  Cycle CE  2D ECHO  Risk stratification labs  Baby ASA  Formal cardiology consult as clinically indicated

## 2022-10-27 NOTE — ED Notes (Signed)
BMP resulted with a K+ less than 2. Unable to send the patient to the assigned bed.

## 2022-10-27 NOTE — ED Notes (Signed)
Dr. Lenard Lance aware of potassium value of less than 2

## 2022-10-27 NOTE — Assessment & Plan Note (Signed)
+   dysuria, increased urinary frequency w/ noted WBC 20  UA pending  Will empirically cover w/ IV rocephin pending urine culture  Follow closely

## 2022-10-27 NOTE — Assessment & Plan Note (Signed)
1/5th liquor drinker daily  Start CIWA protocol

## 2022-10-27 NOTE — Assessment & Plan Note (Signed)
Nonspecific abd pain in setting of ETOH abuse, recurrent nausea/vomiting and ? UTI  Will check CT A&P given diffuse moderate tenderness  IV PPI for gastritis coverage  Antiemetic  IV rocephin for UTI coverage

## 2022-10-27 NOTE — ED Notes (Signed)
IV team arrival

## 2022-10-27 NOTE — ED Notes (Addendum)
Rhyne, RN messaged this nurse stating they are unable to accept the patient with a K+ level less than 2. He stated they would re-evaluate after the BMP results.

## 2022-10-27 NOTE — ED Provider Notes (Signed)
[facility] Provider Note    Event Date/Time   First MD Initiated Contact with Patient 10/27/22 1449     (approximate)  History   Chief Complaint: Chest Pain  HPI  Katie Stanley is a 37 y.o. female  with a pmhx of anxiety, gerd, etoh abuse who presents to the ED today for 3d of increased weakness, n/v and fatigue.  Pt states she normally drinks 1/5 of liquor daily but has been nauseased and has not been able to keep down much alcohol and no food over the past 3 days.  States a hx of low potassium when this has occurred in the past as well.  States seh is feeling more weak over the past several weeks.  Denies any chest pain or SOB, denies any focal abd pain, but states abd cramping as well as mucular cramping at times.  No fever. No urinary symptoms.   Physical Exam   Triage Vital Signs: ED Triage Vitals  Encounter Vitals Group     BP 10/27/22 1247 (!) 124/93     Systolic BP Percentile --      Diastolic BP Percentile --      Pulse Rate 10/27/22 1247 (!) 122     Resp 10/27/22 1247 18     Temp 10/27/22 1247 98.4 F (36.9 C)     Temp Source 10/27/22 1247 Oral     SpO2 10/27/22 1247 99 %     Weight 10/27/22 1246 168 lb (76.2 kg)     Height 10/27/22 1246 5\' 3"  (1.6 m)     Head Circumference --      Peak Flow --      Pain Score 10/27/22 1246 9     Pain Loc --      Pain Education --      Exclude from Growth Chart --     Most recent vital signs: Vitals:   10/27/22 1247  BP: (!) 124/93  Pulse: (!) 122  Resp: 18  Temp: 98.4 F (36.9 C)  SpO2: 99%    General: Awake, no distress.  CV:  Good peripheral perfusion.  Regular rate and rhythm  Resp:  Normal effort.  Equal breath sounds bilaterally.  Abd:  No distention.  Soft, nontender.  No rebound or guarding.  ED Results / Procedures / Treatments   EKG  EKG viewed and interpretted by myself shows sinus tachy at 121 bpm, narrow QRS, normal axis, QT prolongation. NO st elevation.  RADIOLOGY  I  have reviewed and interpretted the CXR images.  No consolidation on my evaluation. Rads has read as neg.   MEDICATIONS ORDERED IN ED: Medications  potassium chloride 10 mEq in 100 mL IVPB (has no administration in time range)  lactated ringers bolus 1,000 mL (has no administration in time range)  lactated ringers bolus 1,000 mL (has no administration in time range)  LORazepam (ATIVAN) injection 1 mg (has no administration in time range)  LORazepam (ATIVAN) injection 0-4 mg (has no administration in time range)    Or  LORazepam (ATIVAN) tablet 0-4 mg (has no administration in time range)  LORazepam (ATIVAN) injection 0-4 mg (has no administration in time range)    Or  LORazepam (ATIVAN) tablet 0-4 mg (has no administration in time range)  thiamine (VITAMIN B1) tablet 100 mg (has no administration in time range)    Or  thiamine (VITAMIN B1) injection 100 mg (has no administration in time range)  ondansetron (ZOFRAN) injection 4 mg (has  no administration in time range)     IMPRESSION / MDM / ASSESSMENT AND PLAN / ED COURSE  I reviewed the triage vital signs and the nursing notes.  Patient's presentation is most consistent with acute presentation with potential threat to life or bodily function.  Pt presents with 3d of n/v, weakness, cramping.  Pt has a hx of etoh abuse, about 1/5 of liquor daily.  States for the past 3d she has been nauseated and has not been able to drink much and has not had anything to eat.  Pts labs are significant for anion gap elevation, and significant hypokalemia.  CBC shows significant leukocytosis, troponin mildly elevated, no CP/SOB, suspect likely demand ischemia.   Given the pts labs i highly suspect alcoholic ketoacidosis.  We will begin significant IV hydration, dose zofran and ativna.  Start on CIWA. We will replete with IV K, pt does not believe she can tolerate oral K at the moment.    Pt will require admission to the hospitalist service for further  workup and treatment.   FINAL CLINICAL IMPRESSION(S) / ED DIAGNOSES   Alcoholic ketoacidosis Hypokalemia   Note:  This document was prepared using Dragon voice recognition software and may include unintentional dictation errors.   Minna Antis, MD 10/27/22 1905

## 2022-10-27 NOTE — Assessment & Plan Note (Addendum)
K <2 today  Recurrent issue in setting of chronic ETOH abuse and intermittent nausea/vomiting episodes  Replete Check mg level and replete appropriately  Follow closely

## 2022-10-27 NOTE — H&P (Signed)
History and Physical    Patient: Katie Stanley ZOX:[id number] DOB: [birth year] DOA: 10/27/2022 DOS: the patient was seen and examined on 10/27/2022 PCP: Dorcas Carrow, DO  Patient coming from: Home  Chief Complaint:  Chief Complaint  Patient presents with   Chest Pain   HPI: Katie Stanley is a 37 y.o. female with medical history significant of Alcohol abuse, palpitations, GERD, tobacco abuse, substance abuse presenting w/ etoh abuse, hypokalemia, elevated troponin, uti. Pt reports drinking at least 1/5 of liquor daily.  Patient reports progressive onset of recurrent nausea and vomiting over the past 24 hours.  No reported bloody or bilious emesis.  Positive generalized abdominal pain.  Mild dysuria and increased urinary frequency.  Positive palpitations.  No frank chest pain.  No frank shortness of breath.  Smokes around 1/2 to 1 pack/day.  Does desire to quit drinking.  Nausea vomiting have been getting progressively worse over the past 12 to 24 hours.  Has had admissions for similar issues in the past. Presented to the ER afebrile, hemodynamically stable.  White count 20.8, hemoglobin 13, platelets 337, creatinine 1.28, glucose 142, potassium less than 2.  Magnesium level pending.  EKG with accelerated junctional rhythm and noted QT around 600.  Chest x-ray stable. Review of Systems: As mentioned in the history of present illness. All other systems reviewed and are negative. Past Medical History:  Diagnosis Date   Anxiety    Biliary calculi 12/07/2009   Cholelithiasis    Depression    GERD (gastroesophageal reflux disease)    Hepatitis C    body "self healed" per patient   Palpitations    Retained intrauterine contraceptive device (IUD) 01/20/2020   Substance abuse (HCC)    Past Surgical History:  Procedure Laterality Date   CHOLECYSTECTOMY     2011   COLONOSCOPY WITH PROPOFOL N/A 09/20/2022   Procedure: COLONOSCOPY WITH PROPOFOL;  Surgeon: Midge Minium, MD;  Location: [facility]  ENDOSCOPY;  Service: Endoscopy;  Laterality: N/A;   ESOPHAGOGASTRODUODENOSCOPY N/A 09/29/2021   Procedure: ESOPHAGOGASTRODUODENOSCOPY (EGD);  Surgeon: Midge Minium, MD;  Location: [facility] ENDOSCOPY;  Service: Endoscopy;  Laterality: N/A;   ESOPHAGOGASTRODUODENOSCOPY (EGD) WITH PROPOFOL N/A 01/21/2018   Procedure: ESOPHAGOGASTRODUODENOSCOPY (EGD) WITH Biopsy;  Surgeon: Midge Minium, MD;  Location: [facility];  Service: Endoscopy;  Laterality: N/A;   HYSTEROSCOPY WITH D & C N/A 01/20/2020   Procedure: DILATATION AND CURETTAGE /HYSTEROSCOPY;  Surgeon: Conard Novak, MD;  Location: [facility] ORS;  Service: Gynecology;  Laterality: N/A;   INTRAUTERINE DEVICE (IUD) INSERTION N/A 01/20/2020   Procedure: INTRAUTERINE DEVICE (IUD) INSERTION MIRENA IUD;  Surgeon: Conard Novak, MD;  Location: [facility] ORS;  Service: Gynecology;  Laterality: N/A;   IUD REMOVAL N/A 01/20/2020   Procedure: INTRAUTERINE DEVICE (IUD) REMOVAL;  Surgeon: Conard Novak, MD;  Location: [facility] ORS;  Service: Gynecology;  Laterality: N/A;   PORT-A-CATH REMOVAL  02/08/2022   PORTA CATH INSERTION N/A 01/01/2019   Procedure: PORTA CATH INSERTION;  Surgeon: Annice Needy, MD;  Location: [facility] INVASIVE CV LAB;  Service: Cardiovascular;  Laterality: N/A;   VASCULAR SURGERY     Social History:  reports that she has been smoking cigarettes. She has a 12 pack-year smoking history. She has never used smokeless tobacco. She reports current alcohol use. She reports current drug use. Drug: Marijuana.  Allergies  Allergen Reactions   Penicillins Swelling    Family History  Problem Relation Age of Onset   Hypertension Mother    Diabetes Father  Hypertension Father    Liver cancer Maternal Grandmother    Hypertension Maternal Grandfather    Breast cancer Paternal Grandmother    Heart disease Paternal Grandfather     Prior to Admission medications   Medication Sig Start Date End Date Taking? Authorizing Provider  citalopram  (CELEXA) 20 MG tablet TAKE 1 TABLET(20 MG) BY MOUTH DAILY 08/17/22   Johnson, Megan P, DO  lidocaine (LIDODERM) 5 % Place 1 patch onto the skin daily. Remove & Discard patch within 12 hours or as directed by MD 04/28/22   Olevia Perches P, DO  lisinopril (ZESTRIL) 10 MG tablet Take 1 tablet (10 mg total) by mouth daily. 08/17/22   Johnson, Megan P, DO  Magnesium 250 MG TABS Take 1 tablet (250 mg total) by mouth 2 (two) times daily. 07/11/22   Cannady, Corrie Dandy T, NP  methocarbamol (ROBAXIN) 500 MG tablet Take 1 tablet (500 mg total) by mouth 2 (two) times daily. 10/20/22   Katha Cabal, DO  metoprolol succinate (TOPROL-XL) 25 MG 24 hr tablet Take 1 tablet (25 mg total) by mouth daily. 08/17/22   Johnson, Megan P, DO  naltrexone (DEPADE) 50 MG tablet Take 1 tablet (50 mg total) by mouth daily. Do not take any opiods with this medicine or you will go into withdrawal 08/17/22   Laural Benes, Megan P, DO  Naphazoline HCl (CLEAR EYES OP) Place 1 drop into both eyes daily as needed (itching).    [provider]  naproxen (NAPROSYN) 500 MG tablet Take 1 tablet (500 mg total) by mouth 2 (two) times daily with a meal. 10/20/22   Brimage, Vondra, DO  pantoprazole (PROTONIX) 40 MG tablet Take 2 tablets (80 mg total) by mouth daily. 08/02/22   Johnson, Megan P, DO  potassium chloride SA (KLOR-CON M) 20 MEQ tablet Take 1 tablet (20 mEq total) by mouth daily. 07/11/22   Cannady, Corrie Dandy T, NP  predniSONE (STERAPRED UNI-PAK 21 TAB) 10 MG (21) TBPK tablet Take by mouth daily. Take 6 tabs by mouth daily for 1, then 5 tabs for 1 day, then 4 tabs for 1 day, then 3 tabs for 1 day, then 2 tabs for 1 day, then 1 tab for 1 day. 10/20/22   Brimage, Seward Meth, DO  sucralfate (CARAFATE) 1 GM/10ML suspension Take 10 mLs (1 g total) by mouth 4 (four) times daily -  with meals and at bedtime. 08/17/22   Dorcas Carrow, DO    Physical Exam: Vitals:   10/27/22 1246 10/27/22 1247  BP:  (!) 124/93  Pulse:  (!) 122  Resp:  18  Temp:  98.4 F  (36.9 C)  TempSrc:  Oral  SpO2:  99%  Weight: 76.2 kg   Height: 5\' 3"  (1.6 m)    Physical Exam Constitutional:      Appearance: She is normal weight.  HENT:     Head: Normocephalic.     Mouth/Throat:     Mouth: Mucous membranes are moist.  Eyes:     Pupils: Pupils are equal, round, and reactive to light.  Cardiovascular:     Rate and Rhythm: Normal rate and regular rhythm.  Pulmonary:     Effort: Pulmonary effort is normal.  Abdominal:     General: Bowel sounds are normal.  Musculoskeletal:        General: Normal range of motion.     Cervical back: Normal range of motion.  Skin:    General: Skin is warm.  Neurological:     General:  No focal deficit present.  Psychiatric:        Mood and Affect: Mood normal.     Data Reviewed:  There are no new results to review at this time. DG Chest 2 View CLINICAL DATA:  Chest pain.  EXAM: CHEST - 2 VIEW  COMPARISON:  June 21, 2022.  FINDINGS: The heart size and mediastinal contours are within normal limits. Both lungs are clear. The visualized skeletal structures are unremarkable.  IMPRESSION: No active cardiopulmonary disease.  Electronically Signed   By: Lupita Raider M.D.   On: 10/27/2022 13:20  Lab Results  Component Value Date   WBC 20.8 (H) 10/27/2022   HGB 12.9 10/27/2022   HCT 36.6 10/27/2022   MCV 88.8 10/27/2022   PLT 337 10/27/2022   Last metabolic panel Lab Results  Component Value Date   GLUCOSE 142 (H) 10/27/2022   NA 132 (L) 10/27/2022   K <2.0 (LL) 10/27/2022   CL 78 (L) 10/27/2022   CO2 28 10/27/2022   BUN 25 (H) 10/27/2022   CREATININE 1.28 (H) 10/27/2022   GFRNONAA 55 (L) 10/27/2022   CALCIUM 9.0 10/27/2022   PHOS 3.5 07/11/2022   PROT 6.5 07/11/2022   ALBUMIN 2.9 (L) 07/11/2022   LABGLOB 2.8 01/06/2016   AGRATIO 1.5 01/06/2016   BILITOT 1.5 (H) 07/11/2022   ALKPHOS 163 (H) 07/11/2022   AST 131 (H) 07/11/2022   ALT 69 (H) 07/11/2022   ANIONGAP 26 (H) 10/27/2022     Assessment and Plan: * Hypokalemia K <2 today  Recurrent issue in setting of chronic ETOH abuse and intermittent nausea/vomiting episodes  Replete Check mg level and replete appropriately  Follow closely  Elevated troponin Trop 70s on presentation in setting of ETOH abuse, nausea/vomiting/, ETOH abuse  Noted prolonged QT in setting of hypokalemia  No active CP  Suspect minimal to mild demand ischemia  Cycle CE  2D ECHO  Risk stratification labs  Baby ASA  Formal cardiology consult as clinically indicated   Abdominal pain Nonspecific abd pain in setting of ETOH abuse, recurrent nausea/vomiting and ? UTI  Will check CT A&P given diffuse moderate tenderness  IV PPI for gastritis coverage  Antiemetic  IV rocephin for UTI coverage    UTI (urinary tract infection) + dysuria, increased urinary frequency w/ noted WBC 20  UA pending  Will empirically cover w/ IV rocephin pending urine culture  Follow closely  Primary hypertension BP stable  Titrate home regimen    Alcohol abuse 1/5th liquor drinker daily  Start CIWA protocol    History of drug abuse in remission [facility]) UDS pending       Advance Care Planning:   Code Status: Full Code   Consults: None   Family Communication: No family at the bedside   Severity of Illness: The appropriate patient status for this patient is OBSERVATION. Observation status is judged to be reasonable and necessary in order to provide the required intensity of service to ensure the patient's safety. The patient's presenting symptoms, physical exam findings, and initial radiographic and laboratory data in the context of their medical condition is felt to place them at decreased risk for further clinical deterioration. Furthermore, it is anticipated that the patient will be medically stable for discharge from the hospital within 2 midnights of admission.   Author: Floydene Flock, MD 10/27/2022 3:50 PM  For on call review  [email or web address].

## 2022-10-27 NOTE — ED Triage Notes (Signed)
Pt states she developed chest pain earlier this week and headache. Pt reports past problems with "electrolytes" she states her potassium and magnesium were low. Pt reports feeling weak as well.

## 2022-10-27 NOTE — Assessment & Plan Note (Signed)
UDS pending  

## 2022-10-27 NOTE — ED Notes (Signed)
Patient to CT.

## 2022-10-27 NOTE — Assessment & Plan Note (Signed)
BP stable Titrate home regimen 

## 2022-10-28 ENCOUNTER — Inpatient Hospital Stay (HOSPITAL_COMMUNITY)
Admit: 2022-10-28 | Discharge: 2022-10-28 | Disposition: A | Discharge status: Home / Self Care | Payer: Medicaid Other | Attending: Family Medicine | Admitting: Family Medicine

## 2022-10-28 DIAGNOSIS — E876 Hypokalemia: Secondary | ICD-10-CM | POA: Diagnosis not present

## 2022-10-28 DIAGNOSIS — R079 Chest pain, unspecified: Secondary | ICD-10-CM

## 2022-10-28 LAB — COMPREHENSIVE METABOLIC PANEL
ALT: 32 U/L (ref 0–44)
ALT: 39 U/L (ref 0–44)
AST: 58 U/L — ABNORMAL HIGH (ref 15–41)
AST: 101 U/L — ABNORMAL HIGH (ref 15–41)
Albumin: 2.7 g/dL — ABNORMAL LOW (ref 3.5–5.0)
Albumin: 2.9 g/dL — ABNORMAL LOW (ref 3.5–5.0)
Alkaline Phosphatase: 166 U/L — ABNORMAL HIGH (ref 38–126)
Alkaline Phosphatase: 178 U/L — ABNORMAL HIGH (ref 38–126)
Anion gap: 11 (ref 5–15)
Anion gap: 13 (ref 5–15)
BUN: 19 mg/dL (ref 6–20)
BUN: 16 mg/dL (ref 6–20)
CO2: 35 mmol/L — ABNORMAL HIGH (ref 22–32)
CO2: 29 mmol/L (ref 22–32)
Calcium: 7.9 mg/dL — ABNORMAL LOW (ref 8.9–10.3)
Calcium: 7.9 mg/dL — ABNORMAL LOW (ref 8.9–10.3)
Chloride: 89 mmol/L — ABNORMAL LOW (ref 98–111)
Chloride: 91 mmol/L — ABNORMAL LOW (ref 98–111)
Creatinine, Ser: 1.03 mg/dL — ABNORMAL HIGH (ref 0.44–1.00)
Creatinine, Ser: 1.16 mg/dL — ABNORMAL HIGH (ref 0.44–1.00)
GFR, Estimated: >60 mL/min (ref >60)
GFR, Estimated: >60 mL/min (ref >60)
Glucose, Bld: 124 mg/dL — ABNORMAL HIGH (ref 70–99)
Glucose, Bld: 144 mg/dL — ABNORMAL HIGH (ref 70–99)
Potassium: <2 mmol/L — CL (ref 3.5–5.1)
Potassium: 3 mmol/L — ABNORMAL LOW (ref 3.5–5.1)
Sodium: 135 mmol/L (ref 135–145)
Sodium: 133 mmol/L — ABNORMAL LOW (ref 135–145)
Total Bilirubin: 2.8 mg/dL — ABNORMAL HIGH (ref 0.3–1.2)
Total Bilirubin: 2 mg/dL — ABNORMAL HIGH (ref 0.3–1.2)
Total Protein: 6.1 g/dL — ABNORMAL LOW (ref 6.5–8.1)
Total Protein: 6.2 g/dL — ABNORMAL LOW (ref 6.5–8.1)

## 2022-10-28 LAB — LACTATE DEHYDROGENASE: LDH: 138 U/L (ref 98–192)

## 2022-10-28 LAB — CBC
HCT: 29.1 % — ABNORMAL LOW (ref 36.0–46.0)
Hemoglobin: 10.1 g/dL — ABNORMAL LOW (ref 12.0–15.0)
MCH: 31.6 pg (ref 26.0–34.0)
MCHC: 34.7 g/dL (ref 30.0–36.0)
MCV: 90.9 fL (ref 80.0–100.0)
Platelets: 274 10*3/uL (ref 150–400)
RBC: 3.2 MIL/uL — ABNORMAL LOW (ref 3.87–5.11)
RDW: 14.4 % (ref 11.5–15.5)
WBC: 9.2 10*3/uL (ref 4.0–10.5)
nRBC: 0 % (ref 0.0–0.2)

## 2022-10-28 LAB — ECHOCARDIOGRAM COMPLETE
AR max vel: 2.53 cm2
AV Peak grad: 12.3 mmHg
Ao pk vel: 1.75 m/s
Area-P 1/2: 6.37 cm2
Height: 63 in
S' Lateral: 2.7 cm
Weight: 2688 [oz_av]

## 2022-10-28 LAB — MAGNESIUM: Magnesium: 2.6 mg/dL — ABNORMAL HIGH (ref 1.7–2.4)

## 2022-10-28 LAB — LIPASE, BLOOD: Lipase: 41 U/L (ref 11–51)

## 2022-10-28 MED ORDER — MORPHINE SULFATE (PF) 2 MG/ML IV SOLN
2.0000 mg | INTRAVENOUS | Status: DC | PRN
  Administered 2022-10-28 – 2022-11-01 (×21): 2 mg via INTRAVENOUS
  Filled 2022-10-28 (×21): qty 1

## 2022-10-28 MED ORDER — SODIUM CHLORIDE 0.9 % IV SOLN
1.0000 g | INTRAVENOUS | Status: DC
  Administered 2022-10-28 – 2022-10-30 (×3): 1 g via INTRAVENOUS
  Filled 2022-10-28 (×3): qty 10

## 2022-10-28 MED ORDER — POTASSIUM CHLORIDE 10 MEQ/100ML IV SOLN
10.0000 meq | INTRAVENOUS | Status: AC
  Administered 2022-10-28 (×5): 10 meq via INTRAVENOUS
  Filled 2022-10-28 (×5): qty 100

## 2022-10-28 NOTE — Progress Notes (Addendum)
       CROSS COVER NOTE  NAME: Katie Stanley MRN: [id number] DOB : 01/16/1986    Concern as stated by nurse / staff    K still <2     Pertinent findings on chart review: Patient presented to ER with nausea and vomiting. Found to have significant electrolyte derangements with K <2 and Mag 1.4., as well as dehydration and arrhythmia with prolonged QT. UA indicative of of UTI and +symptoms.  CT abdomen and pelvis reports:  "Enlarged fatty liver. There is nodularity of the liver surface suggesting possible cirrhosis.   There is 2.5 cm low-density lesion in the head of the pancreas. This may suggest inflammatory or neoplastic process. Please correlate with laboratory findings and consider short-term follow-up CT and MRI as warranted.   There is thickening in colon, especially in the right colon suggesting chronic inflammatory or infectious colitis. There is wall thickening in terminal ileum, possibly due to chronic inflammation. Appendix is not dilated.   There is stranding in the mesenteric fat along with subcentimeter nodes in mesentery suggesting nonspecific inflammation. Stranding is particularly prominent in right lower quadrant of abdomen medial to the ileocecal junction. There is no loculated fluid collection in the mesentery.   There is no evidence of intestinal obstruction or pneumoperitoneum. There is no hydronephrosis. There are a few small bilateral renal stones."  Troponin trend  79 - 83 - 75 - flat   Family history + breast (PGM) and liver cancer (MGM)  Med history includes, not limited to Cholecystectomy 2011 Chronic hep C GERD Hypertension Vit D deficiency Alcohol abuse hx of cardiac arrest Tachycardia   Colonoscopy 09/20/2022 with Dr Servando Snare BX tubular adenoma and hyperplastic polyp EGD 6/ 2023 - gastritis ECHO 02/2019 EF 50-55%        Assessment and  Interventions   Assessment:  Plan: Additional potassium and magnesium supplementation  ordered Mag and phos added to am labs Lipase, CA 19-9 and LDH added to am labs        Donnie Mesa NP [facility] Cross Cover 7pm-7am - check amion for availability Pager [phone]

## 2022-10-28 NOTE — ED Notes (Addendum)
Round check is complete at this time. Pt is resting with no s/s of distress. Shift change CIWA is unable to be completed at this time.

## 2022-10-28 NOTE — Progress Notes (Signed)
  Echocardiogram 2D Echocardiogram has been performed.  Katie Stanley 10/28/2022, 9:33 AM

## 2022-10-28 NOTE — Progress Notes (Signed)
Progress Note   Patient: Katie Stanley:[id number] DOB: 1986-03-08 DOA: 10/27/2022     1 DOS: the patient was seen and examined on 10/28/2022   Brief hospital course:  37 y.o. female with medical history significant of Alcohol abuse, palpitations, GERD, tobacco abuse, substance abuse presenting w/ etoh abuse, hypokalemia, elevated troponin, uti. Pt reports drinking at least 1/5 of liquor daily.  Patient reports progressive onset of recurrent nausea and vomiting over the past 24 hours.  No reported bloody or bilious emesis.  Positive generalized abdominal pain.  Mild dysuria and increased urinary frequency.  Positive palpitations.  No frank chest pain.  No frank shortness of breath.  Smokes around 1/2 to 1 pack/day.  Does desire to quit drinking.  Nausea vomiting have been getting progressively worse over the past 12 to 24 hours.  Has had admissions for similar issues in the past. Presented to the ER afebrile, hemodynamically stable.  White count 20.8, hemoglobin 13, platelets 337, creatinine 1.28, glucose 142, potassium less than 2.  Magnesium level pending.  EKG with accelerated junctional rhythm and noted QT around 600.  Chest x-ray stable.  10/28/2022 : Patient symptomatically improving.  However persistent hypokalemia being repleted.  Abdominal CT showed 2.5 cm low-density lesion in the head of pancreas lipase normal will need reevaluation in the near future.  Patient continues to stay on CIWA protocol.   Assessment and Plan: * Hypokalemia K <2 today  Recurrent issue in setting of chronic ETOH abuse and intermittent nausea/vomiting episodes  Being repleted still hypokalemic. Magnesium was 1.4 repleted post repletion is 2.6. Follow closely  Elevated troponin -mildly elevated troponins which has plateaued likely from demand.  Echo pending Trop 70s on presentation in setting of ETOH abuse, nausea/vomiting/, ETOH abuse  Noted prolonged QT in setting of hypokalemia  No active CP  Suspect  minimal to mild demand ischemia  Cycle CE  2D ECHO pending Risk stratification labs  Baby ASA  Formal cardiology consult as clinically indicated   Abdominal pain -CT abdomen showing  2.5 cm low-density lesion in the head of the pancreas. This may suggest inflammatory or neoplastic process.  Lipase.  Will need reevaluation in 4 to 6 weeks.  Nonspecific abd pain in setting of ETOH abuse, recurrent nausea/vomiting and ? UTI  IV PPI for gastritis coverage  Antiemetic  IV rocephin for UTI coverage   UTI (urinary tract infection) + dysuria, increased urinary frequency w/ noted WBC 20  UA pending  Will empirically cover w/ IV rocephin pending urine culture  Follow closely  AKI (acute kidney injury) - improving  Creatinine 1.28 with a GFR in the 50s Likely prerenal etiology in the setting of intractable nausea and vomiting IV fluid hydration Hold offending agents Monitor for now  Primary hypertension BP stable  Titrate home regimen   Alcohol abuse 1/5th liquor drinker daily   On CIWA protocol   History of drug abuse in remission  UDS pending      Subjective: Patient seen and examined this morning.  No overnight events.  Complains of mild nausea.  Started to complain of pain which was addressed with morphine as needed.  Vital labs and imaging reviewed.  Vitally stable labs with persistent hypokalemia despite receiving potassium runs overnight.  Otherwise asymptomatic.  Physical Exam: Vitals:   10/28/22 0405 10/28/22 0545 10/28/22 0604 10/28/22 0700  BP: 127/77  120/80 128/82  Pulse: 98 98  (!) 101  Resp: 18 17  (!) 21  Temp:  TempSrc:      SpO2: 96% 100%  96%  Weight:      Height:       Physical Exam Constitutional:      Appearance: She is obese. She is not diaphoretic.  HENT:     Head: Normocephalic and atraumatic.  Eyes:     Pupils: Pupils are equal, round, and reactive to light.  Cardiovascular:     Rate and Rhythm: Normal rate and regular rhythm.      Pulses: Normal pulses.  Pulmonary:     Effort: Pulmonary effort is normal. No respiratory distress.     Breath sounds: No stridor.  Abdominal:     Palpations: Abdomen is soft.  Musculoskeletal:        General: No swelling.     Cervical back: Normal range of motion.  Skin:    General: Skin is warm.  Neurological:     General: No focal deficit present.     Mental Status: She is alert and oriented to person, place, and time. Mental status is at baseline.  Psychiatric:     Comments: Flat affect     Data Reviewed:  Results are pending, will review when available.  Family Communication: None by bedside   Disposition: Status is: Inpatient Remains inpatient appropriate because: Hypokalemia, AKI and UTI   Planned Discharge Destination: Home    Time spent: 32 minutes  Author: Kirstie Peri, MD 10/28/2022 7:55 AM  For on call review [email or web address].

## 2022-10-28 NOTE — ED Notes (Signed)
ECHO at bedside.

## 2022-10-28 NOTE — TOC CM/SW Note (Signed)
TOC consulted for SA resources. SA resources added to AVS.  Alfonso Ramus, LCSW Transitions of Care Department [phone]

## 2022-10-28 NOTE — ED Notes (Signed)
DR. Lucianne Muss was notified of pt's low potassium--according to lab call critical value, it's less than 2

## 2022-10-28 NOTE — ED Notes (Signed)
Patient received dinner tray at this time.  

## 2022-10-28 NOTE — Discharge Instructions (Signed)

## 2022-10-28 NOTE — ED Notes (Signed)
RN transporting patient to inpatient floor at this time.

## 2022-10-28 NOTE — ED Notes (Signed)
Family at bedside at this time

## 2022-10-28 NOTE — ED Notes (Signed)
Pt ambulatory to toilet

## 2022-10-29 DIAGNOSIS — E876 Hypokalemia: Secondary | ICD-10-CM | POA: Diagnosis not present

## 2022-10-29 LAB — COMPREHENSIVE METABOLIC PANEL
ALT: 35 U/L (ref 0–44)
AST: 77 U/L — ABNORMAL HIGH (ref 15–41)
Albumin: 2.7 g/dL — ABNORMAL LOW (ref 3.5–5.0)
Alkaline Phosphatase: 170 U/L — ABNORMAL HIGH (ref 38–126)
Anion gap: 10 (ref 5–15)
BUN: 12 mg/dL (ref 6–20)
CO2: 30 mmol/L (ref 22–32)
Calcium: 7.7 mg/dL — ABNORMAL LOW (ref 8.9–10.3)
Chloride: 97 mmol/L — ABNORMAL LOW (ref 98–111)
Creatinine, Ser: 0.96 mg/dL (ref 0.44–1.00)
GFR, Estimated: >60 mL/min (ref >60)
Glucose, Bld: 111 mg/dL — ABNORMAL HIGH (ref 70–99)
Potassium: 2.5 mmol/L — CL (ref 3.5–5.1)
Sodium: 137 mmol/L (ref 135–145)
Total Bilirubin: 1.1 mg/dL (ref 0.3–1.2)
Total Protein: 5.6 g/dL — ABNORMAL LOW (ref 6.5–8.1)

## 2022-10-29 LAB — CBC
HCT: 24.3 % — ABNORMAL LOW (ref 36.0–46.0)
Hemoglobin: 8.5 g/dL — ABNORMAL LOW (ref 12.0–15.0)
MCH: 32 pg (ref 26.0–34.0)
MCHC: 35 g/dL (ref 30.0–36.0)
MCV: 91.4 fL (ref 80.0–100.0)
Platelets: 242 10*3/uL (ref 150–400)
RBC: 2.66 MIL/uL — ABNORMAL LOW (ref 3.87–5.11)
RDW: 14.6 % (ref 11.5–15.5)
WBC: 8.2 10*3/uL (ref 4.0–10.5)
nRBC: 0 % (ref 0.0–0.2)

## 2022-10-29 LAB — BLOOD GAS, VENOUS
Acid-Base Excess: 10.4 mmol/L — ABNORMAL HIGH (ref 0.0–2.0)
Bicarbonate: 35.7 mmol/L — ABNORMAL HIGH (ref 20.0–28.0)
O2 Saturation: 78.4 %
Patient temperature: 37
pCO2, Ven: 49 mmHg (ref 44–60)
pH, Ven: 7.47 — ABNORMAL HIGH (ref 7.25–7.43)
pO2, Ven: 49 mmHg — ABNORMAL HIGH (ref 32–45)

## 2022-10-29 LAB — POTASSIUM: Potassium: 3.2 mmol/L — ABNORMAL LOW (ref 3.5–5.1)

## 2022-10-29 MED ORDER — POTASSIUM CHLORIDE CRYS ER 20 MEQ PO TBCR
40.0000 meq | EXTENDED_RELEASE_TABLET | Freq: Once | ORAL | Status: AC
  Administered 2022-10-29: 40 meq via ORAL
  Filled 2022-10-29: qty 2

## 2022-10-29 MED ORDER — POTASSIUM CHLORIDE 10 MEQ/100ML IV SOLN
10.0000 meq | INTRAVENOUS | Status: AC
  Administered 2022-10-29 (×4): 10 meq via INTRAVENOUS
  Filled 2022-10-29 (×4): qty 100

## 2022-10-29 NOTE — Plan of Care (Signed)

## 2022-10-29 NOTE — Progress Notes (Signed)
Progress Note   Patient: Katie Stanley XBJ:[id number] DOB: [birth year] DOA: 10/27/2022     2 DOS: the patient was seen and examined on 10/29/2022   Brief hospital course:  37 y.o. female with medical history significant of Alcohol abuse, palpitations, GERD, tobacco abuse, substance abuse presenting w/ etoh abuse, hypokalemia, elevated troponin, uti. Pt reports drinking at least 1/5 of liquor daily.  Patient reports progressive onset of recurrent nausea and vomiting over the past 24 hours.  No reported bloody or bilious emesis.  Positive generalized abdominal pain.  Mild dysuria and increased urinary frequency.  Positive palpitations.  No frank chest pain.  No frank shortness of breath.  Smokes around 1/2 to 1 pack/day.  Does desire to quit drinking.  Nausea vomiting have been getting progressively worse over the past 12 to 24 hours.  Has had admissions for similar issues in the past. Presented to the ER afebrile, hemodynamically stable.  White count 20.8, hemoglobin 13, platelets 337, creatinine 1.28, glucose 142, potassium less than 2.  Magnesium level pending.  EKG with accelerated junctional rhythm and noted QT around 600.  Chest x-ray stable.  10/28/2022 : Patient symptomatically improving.  However persistent hypokalemia being repleted.  Abdominal CT showed 2.5 cm low-density lesion in the head of pancreas lipase normal will need reevaluation in the near future.  Patient continues to stay on CIWA protocol.   10/29/2022: The patient was seen and examined at bedside, this morning.  She is hypersomnolent.  Denies any new complaints.  Venous blood gas obtained.  Refractory hypokalemia on lab work.  Assessment and Plan: Severe hypokalemia, refractory K <2 today  Recurrent issue in setting of chronic ETOH abuse and intermittent nausea/vomiting episodes  Being repleted still hypokalemic. Magnesium was 1.4 repleted post repletion is 2.6. Continue potassium replacement Replete magnesium as  indicated  Elevated troponin -mildly elevated troponins which has plateaued likely from demand.  Echo pending Trop 70s on presentation in setting of ETOH abuse, nausea/vomiting/, ETOH abuse  Noted prolonged QT in setting of hypokalemia  No active CP  Suspect minimal to mild demand ischemia  Cycle CE  2D ECHO done on 10/28/2022, normal LVEF 60-65% with no left ventricular regional wall motion abnormalities Risk stratification labs  Baby ASA  Formal cardiology consult as clinically indicated   Abdominal pain -CT abdomen showing  2.5 cm low-density lesion in the head of the pancreas. This may suggest inflammatory or neoplastic process.  Lipase.  Will need reevaluation in 4 to 6 weeks.  Nonspecific abd pain in setting of ETOH abuse, recurrent nausea/vomiting and ? UTI  IV PPI for gastritis coverage  Antiemetic  IV rocephin for UTI coverage   UTI (urinary tract infection) + dysuria, increased urinary frequency w/ noted WBC 20  UA pending  Will empirically cover w/ IV rocephin pending urine culture  Follow closely  AKI (acute kidney injury) - improving  Creatinine 1.28 with a GFR in the 50s Likely prerenal etiology in the setting of intractable nausea and vomiting IV fluid hydration Hold offending agents Monitor for now  Primary hypertension BP stable  Titrate home regimen   Alcohol abuse 1/5th liquor drinker daily   On CIWA protocol   History of drug abuse in remission  UDS pending   Overweight BMI 29 Recommend weight loss outpatient with regular physical activity and healthy diet         Physical Exam: Vitals:   10/29/22 0544 10/29/22 0600 10/29/22 0750 10/29/22 1222  BP:   121/82 125/85  Pulse:  93 94 92  Resp: 20 20 16 16   Temp:   98.6 F (37 C) 98.1 F (36.7 C)  TempSrc:      SpO2:   98% 99%  Weight:      Height:       Physical Exam Constitutional:      Appearance: She is overweight.  Somnolent.  Arousable to voice. HENT:     Head: Normocephalic  and atraumatic.  Eyes:     Pupils: Pupils are equal, round, and reactive to light.  Cardiovascular:     Rate and Rhythm: Regular rate and rhythm no rubs or murmurs.    Pulses: Normal pulses.  Pulmonary:     Effort: Pulmonary effort is normal. No respiratory distress.     Breath sounds: No stridor.  Abdominal:     Palpations: Abdomen is soft.  Musculoskeletal:        General: No swelling.     Cervical back: Normal range of motion.  Skin:    General: Skin is warm.  Neurological:     General: No focal deficit present.     Mental Status: She is alert and oriented to person, place, and time. Mental status is at baseline.  Psychiatric:     Comments: Flat affect         Data Reviewed:  Results are pending, will review when available.  Family Communication: None by bedside   Disposition: Status is: Inpatient Remains inpatient appropriate because: Hypokalemia, AKI and UTI   Planned Discharge Destination: Home    Time spent: 32 minutes  Author: Darlin Drop, DO 10/29/2022 1:39 PM  For on call review [email or web address].

## 2022-10-30 DIAGNOSIS — R9430 Abnormal result of cardiovascular function study, unspecified: Secondary | ICD-10-CM

## 2022-10-30 DIAGNOSIS — E876 Hypokalemia: Secondary | ICD-10-CM | POA: Diagnosis not present

## 2022-10-30 LAB — POTASSIUM: Potassium: 4 mmol/L (ref 3.5–5.1)

## 2022-10-30 LAB — HEMOGLOBIN A1C
Hgb A1c MFr Bld: 6.6 % — ABNORMAL HIGH (ref 4.8–5.6)
Mean Plasma Glucose: 143 mg/dL

## 2022-10-30 LAB — CA 19-9 (SERIAL): CA 19-9: 158 U/mL — ABNORMAL HIGH (ref 0–35)

## 2022-10-30 MED ORDER — PANTOPRAZOLE SODIUM 40 MG PO TBEC
40.0000 mg | DELAYED_RELEASE_TABLET | Freq: Two times a day (BID) | ORAL | Status: DC
  Administered 2022-10-30 – 2022-11-01 (×4): 40 mg via ORAL
  Filled 2022-10-30 (×4): qty 1

## 2022-10-30 NOTE — Plan of Care (Signed)

## 2022-10-30 NOTE — Progress Notes (Addendum)
Progress Note   Patient: Katie Stanley ZOX:[id number] DOB: Mar 27, 1986 DOA: 10/27/2022     3 DOS: the patient was seen and examined on 10/30/2022   Brief hospital course:  37 y.o. female with medical history significant of Alcohol abuse, palpitations, GERD, tobacco abuse, substance abuse presenting w/ etoh abuse, hypokalemia, elevated troponin, uti. Pt reports drinking at least 1/5 of liquor daily.  Patient reports progressive onset of recurrent nausea and vomiting over the past 24 hours.  No reported bloody or bilious emesis.  Positive generalized abdominal pain.  Mild dysuria and increased urinary frequency.  Positive palpitations.  No frank chest pain.  No frank shortness of breath.  Smokes around 1/2 to 1 pack/day.  Does desire to quit drinking.  Nausea vomiting have been getting progressively worse over the past 12 to 24 hours.  Has had admissions for similar issues in the past. Presented to the ER afebrile, hemodynamically stable.  White count 20.8, hemoglobin 13, platelets 337, creatinine 1.28, glucose 142, potassium less than 2.  Magnesium level pending.  EKG with accelerated junctional rhythm and noted QT around 600.  Chest x-ray stable.  10/28/2022 : Patient symptomatically improving.  However persistent hypokalemia being repleted.  Abdominal CT showed 2.5 cm low-density lesion in the head of pancreas lipase normal will need reevaluation in the near future.  Patient continues to stay on CIWA protocol.   10/29/2022: The patient was seen and examined at bedside, this morning.  She is hypersomnolent.  Denies any new complaints.  Venous blood gas obtained.  Refractory hypokalemia on lab work.  10/30/2022: The patient was seen and examined at bedside.  Denies any anginal symptoms at the time of this visit.  Cardiology consulted due to abnormal echo.  Assessment and Plan: Severe hypokalemia, resolved, post repletion K <2 >>>4.0 Magn 2.6 from 1.4  Elevated troponin -mildly elevated troponins  which has plateaued likely from demand.  Echo pending Trop 70s on presentation in setting of ETOH abuse, nausea/vomiting/, ETOH abuse  Abnormal 2D echo  highly mobile density near the anterior mitral valve that likely represents a redundant chordae tendineae that protrudes into the LVOT, possible endocarditis or bacteremia.  Cardiology recommends TEE.  Prolonged Qtc >600 In the setting of severe hypokalemia Avoid QTc prolonging agents Optimize magnesium level greater than 2.0 Optimize potassium level greater than 4.0  Abdominal pain -CT abdomen showing  2.5 cm low-density lesion in the head of the pancreas. This may suggest inflammatory or neoplastic process.  Lipase normal.  Will need reevaluation in 4 to 6 weeks.  Nonspecific abd pain in setting of ETOH abuse, recurrent nausea/vomiting and ? UTI  IV PPI for gastritis coverage  Antiemetic  IV rocephin for UTI coverage   UTI (urinary tract infection) + dysuria, increased urinary frequency w/ noted WBC 20  UA pending  Will empirically cover w/ IV rocephin pending urine culture  Follow closely  AKI (acute kidney injury) - improving  Creatinine 1.28 with a GFR in the 50s Likely prerenal etiology in the setting of intractable nausea and vomiting IV fluid hydration Hold offending agents Monitor for now  Primary hypertension BP stable  Titrate home regimen   Alcohol abuse 1/5th liquor drinker daily   On CIWA protocol  Alcohol cessation counseling  History of drug abuse in remission  UDS pending   Overweight BMI 29 Recommend weight loss outpatient with regular physical activity and healthy diet  Resolved.  Renal AKI with IV fluid DC'd IV fluid on 10/30/2022. Renal function is back  to normal      Physical Exam: Vitals:   10/29/22 2302 10/30/22 0340 10/30/22 0744 10/30/22 1139  BP: 101/68 (!) 133/94 (!) 131/96 (!) 133/100  Pulse: 88 88 94 91  Resp: 20 18 20 19   Temp: 99 F (37.2 C) 98.4 F (36.9 C) 98 F (36.7 C)  98.4 F (36.9 C)  TempSrc:      SpO2: 97% 100% 95% 100%  Weight:      Height:       Physical Exam Constitutional:      Appearance: She is overweight.  Somnolent.  Arousable to voice. HENT:     Head: Normocephalic and atraumatic. Eyes:     Pupils: Pupils are equal, round, and reactive to light.  Cardiovascular:     Rate and Rhythm: Regular rate and rhythm no rubs or gallops.    Pulses: Normal pulses.  Pulmonary:     Effort: Clear to auscultation no wheezes or rales.    Breath sounds: No stridor.  Abdominal:     Palpations: Abdomen is soft.  Musculoskeletal:        General: No swelling.     Cervical back: Normal range of motion.  Skin:    General: Skin is warm.  Neurological:     General: No focal deficit present.     Mental Status: Alert oriented x 3. Psychiatric:     Comments: Flat affect         Data Reviewed:  Results are pending, will review when available.  Family Communication: None by bedside   Disposition: Status is: Inpatient Remains inpatient appropriate because: Hypokalemia  Planned Discharge Destination: Home    Time spent: 32 minutes  Author: Darlin Drop, DO 10/30/2022 3:12 PM  For on call review [email or web address].

## 2022-10-30 NOTE — Consult Note (Signed)
Cardiology Consultation   Patient ID: Katie Stanley MRN: [id number]; DOB: [birth year]  Admit date: 10/27/2022 Date of Consult: 10/30/2022  PCP:  Dorcas Carrow, DO   [facility] Providers Cardiologist:  New   Patient Profile:   Katie Stanley is a 37 y.o. female with a hx of alcohol abuse, palpitations, GERD, tobacco abuse, substance abuse who is being seen 10/30/2022 for the evaluation of abnormal echo at the request of Dr. Margo Aye.  History of Present Illness:   [name] has been seen by cardiology in the past. Echo in [address] showed LVEF 50-555, no LVH, trace MR. Seen by Dr. Mariah Milling in 2021 for tachycardia and edema. Holter monitor in 05/2019 showed PVCs and rare PACs. Suspected lower leg edema was lymphedema or venous insufficiency. She was given metoprolol for PVCs.   The patient  presented 7/19 with nausea, vomiting, and abdominal pain. She was drinking 1/5 liquor daily. She reported increased urinary frequency. NO chest pain or shortness of breath reported. She reported she could here her heart beat in her ear. Labs showed WBC 20.8, Hgb 13, plt 337, Scr 1.28, potassium<2. HS trop 79>83>75. EKG showed ST with qtc 69ms. She was admitted with hypokalemia, elevated troponin, UTI, AKI and alcohol use. Echo showed normal LVEF with possible endocarditis vs bacteremia and cardiology was asked to see.  Labs: K 3.2 WBC 8.2 Hgb 8.5 Mag 2.6  Past Medical History:  Diagnosis Date   Anxiety    Biliary calculi 12/07/2009   Cholelithiasis    Depression    GERD (gastroesophageal reflux disease)    Hepatitis C    body "self healed" per patient   Palpitations    Retained intrauterine contraceptive device (IUD) 01/20/2020   Substance abuse (HCC)     Past Surgical History:  Procedure Laterality Date   CHOLECYSTECTOMY     2011   COLONOSCOPY WITH PROPOFOL N/A 09/20/2022   Procedure: COLONOSCOPY WITH PROPOFOL;  Surgeon: Midge Minium, MD;  Location: [facility] ENDOSCOPY;  Service:  Endoscopy;  Laterality: N/A;   ESOPHAGOGASTRODUODENOSCOPY N/A 09/29/2021   Procedure: ESOPHAGOGASTRODUODENOSCOPY (EGD);  Surgeon: Midge Minium, MD;  Location[facility] ENDOSCOPY;  Service: Endoscopy;  Laterality: N/A;   ESOPHAGOGASTRODUODENOSCOPY (EGD) WITH PROPOFOL N/A 01/21/2018   Procedure: ESOPHAGOGASTRODUODENOSCOPY (EGD) WITH Biopsy;  Surgeon: Midge Minium, MD;  Location: [facility];  Service: Endoscopy;  Laterality: N/A;   HYSTEROSCOPY WITH D & C N/A 01/20/2020   Procedure: DILATATION AND CURETTAGE /HYSTEROSCOPY;  Surgeon: Conard Novak, MD;  Location: [facility] ORS;  Service: Gynecology;  Laterality: N/A;   INTRAUTERINE DEVICE (IUD) INSERTION N/A 01/20/2020   Procedure: INTRAUTERINE DEVICE (IUD) INSERTION MIRENA IUD;  Surgeon: Conard Novak, MD;  Location: [facility] ORS;  Service: Gynecology;  Laterality: N/A;   IUD REMOVAL N/A 01/20/2020   Procedure: INTRAUTERINE DEVICE (IUD) REMOVAL;  Surgeon: Conard Novak, MD;  Location: [facility] ORS;  Service: Gynecology;  Laterality: N/A;   PORT-A-CATH REMOVAL  02/08/2022   PORTA CATH INSERTION N/A 01/01/2019   Procedure: PORTA CATH INSERTION;  Surgeon: Annice Needy, MD;  Location: [facility] INVASIVE CV LAB;  Service: Cardiovascular;  Laterality: N/A;   VASCULAR SURGERY       Home Medications:  Prior to Admission medications   Medication Sig Start Date End Date Taking? Authorizing Provider  citalopram (CELEXA) 20 MG tablet TAKE 1 TABLET(20 MG) BY MOUTH DAILY 08/17/22  Yes Johnson, Megan P, DO  lidocaine (LIDODERM) 5 % Place 1 patch onto the skin daily. Remove & Discard  patch within 12 hours or as directed by MD 04/28/22  Yes Johnson, Megan P, DO  lisinopril (ZESTRIL) 10 MG tablet Take 1 tablet (10 mg total) by mouth daily. 08/17/22  Yes Johnson, Megan P, DO  Magnesium 250 MG TABS Take 1 tablet (250 mg total) by mouth 2 (two) times daily. 07/11/22  Yes Cannady, Jolene T, NP  methocarbamol (ROBAXIN) 500 MG tablet Take 1 tablet (500 mg total) by mouth 2  (two) times daily. 10/20/22  Yes Brimage, Vondra, DO  metoprolol succinate (TOPROL-XL) 25 MG 24 hr tablet Take 1 tablet (25 mg total) by mouth daily. 08/17/22  Yes Johnson, Megan P, DO  naproxen (NAPROSYN) 500 MG tablet Take 1 tablet (500 mg total) by mouth 2 (two) times daily with a meal. 10/20/22  Yes Brimage, Vondra, DO  pantoprazole (PROTONIX) 40 MG tablet Take 2 tablets (80 mg total) by mouth daily. 08/02/22  Yes Johnson, Megan P, DO  potassium chloride SA (KLOR-CON M) 20 MEQ tablet Take 1 tablet (20 mEq total) by mouth daily. 07/11/22  Yes Cannady, Jolene T, NP  predniSONE (STERAPRED UNI-PAK 21 TAB) 10 MG (21) TBPK tablet Take by mouth daily. Take 6 tabs by mouth daily for 1, then 5 tabs for 1 day, then 4 tabs for 1 day, then 3 tabs for 1 day, then 2 tabs for 1 day, then 1 tab for 1 day. 10/20/22  Yes Brimage, Vondra, DO  sucralfate (CARAFATE) 1 GM/10ML suspension Take 10 mLs (1 g total) by mouth 4 (four) times daily -  with meals and at bedtime. 08/17/22  Yes Johnson, Megan P, DO  naltrexone (DEPADE) 50 MG tablet Take 1 tablet (50 mg total) by mouth daily. Do not take any opiods with this medicine or you will go into withdrawal Patient not taking: Reported on 10/27/2022 08/17/22   Olevia Perches P, DO  Naphazoline HCl (CLEAR EYES OP) Place 1 drop into both eyes daily as needed (itching). Patient not taking: Reported on 10/27/2022    [provider]    Inpatient Medications: Scheduled Meds:  aspirin  81 mg Oral Daily   enoxaparin (LOVENOX) injection  40 mg Subcutaneous Q24H   LORazepam  0-4 mg Intravenous Q12H   Or   LORazepam  0-4 mg Oral Q12H   pantoprazole (PROTONIX) IV  40 mg Intravenous Q12H   thiamine  100 mg Oral Daily   Or   thiamine  100 mg Intravenous Daily   Continuous Infusions:  PRN Meds: morphine injection, ondansetron **OR** ondansetron (ZOFRAN) IV  Allergies:    Allergies  Allergen Reactions   Penicillins Swelling    Social History:   Social History    Socioeconomic History   Marital status: Single    Spouse name: Not on file   Number of children: Not on file   Years of education: Not on file   Highest education level: Not on file  Occupational History   Not on file  Tobacco Use   Smoking status: Every Day    Current packs/day: 1.00    Average packs/day: 1 pack/day for 12.0 years (12.0 ttl pk-yrs)    Types: Cigarettes   Smokeless tobacco: Never  Vaping Use   Vaping status: Never Used  Substance and Sexual Activity   Alcohol use: Yes    Comment: 6 BEERS SOME DAY, 20 shots per day, had some beers6/21/2023   Drug use: Yes    Types: Marijuana    Comment: 09/28/2021   Sexual activity: Yes  Birth control/protection: Implant, I.U.D.  Other Topics Concern   Not on file  Social History Narrative   Not on file   Social Determinants of Health   Financial Resource Strain: Low Risk  (04/06/2022)   [facility]   Overall Financial Resource Strain (CARDIA)    Difficulty of Paying Living Expenses: Not very hard  Food Insecurity: No Food Insecurity (06/21/2022)   Hunger Vital Sign    Worried About Running Out of Food in the Last Year: Never true    Ran Out of Food in the Last Year: Never true  Transportation Needs: No Transportation Needs (06/21/2022)   PRAPARE - Administrator, Civil Service (Medical): No    Lack of Transportation (Non-Medical): No  Physical Activity: Not on file  Stress: Not on file  Social Connections: Not on file  Intimate Partner Violence: Not At Risk (06/21/2022)   Humiliation, Afraid, Rape, and Kick questionnaire    Fear of Current or Ex-Partner: No    Emotionally Abused: No    Physically Abused: No    Sexually Abused: No    Family History:    Family History  Problem Relation Age of Onset   Hypertension Mother    Diabetes Father    Hypertension Father    Liver cancer Maternal Grandmother    Hypertension Maternal Grandfather    Breast cancer Paternal  Grandmother    Heart disease Paternal Grandfather      ROS:  Please see the history of present illness.   All other ROS reviewed and negative.     Physical Exam/Data:   Vitals:   10/29/22 2302 10/30/22 0340 10/30/22 0744 10/30/22 1139  BP: 101/68 (!) 133/94 (!) 131/96 (!) 133/100  Pulse: 88 88 94 91  Resp: 20 18 20 19   Temp: 99 F (37.2 C) 98.4 F (36.9 C) 98 F (36.7 C) 98.4 F (36.9 C)  TempSrc:      SpO2: 97% 100% 95% 100%  Weight:      Height:        Intake/Output Summary (Last 24 hours) at 10/30/2022 1241 Last data filed at 10/30/2022 1023 Gross per 24 hour  Intake 2191.04 ml  Output --  Net 2191.04 ml      10/27/2022   12:46 PM 10/20/2022   12:11 PM 09/20/2022    8:43 AM  Last 3 Weights  Weight (lbs) 168 lb 188 lb 0.8 oz 188 lb  Weight (kg) 76.204 kg 85.3 kg 85.276 kg     Body mass index is 29.76 kg/m.  General:  Well nourished, well developed, in no acute distress HEENT: normal Neck: no JVD Vascular: No carotid bruits; Distal pulses 2+ bilaterally Cardiac:  normal S1, S2; RRR; + murmur  Lungs:  clear to auscultation bilaterally, no wheezing, rhonchi or rales  Abd: soft, nontender, no hepatomegaly  Ext: no edema Musculoskeletal:  No deformities, BUE and BLE strength normal and equal Skin: warm and dry  Neuro:  CNs 2-12 intact, no focal abnormalities noted Psych:  Normal affect   EKG:  The EKG was personally reviewed and demonstrates:  ST 121bpm, Qtc Telemetry:  Telemetry was personally reviewed and demonstrates:  NSR/ST HR 90-100s, PVCs  Relevant CV Studies:  Holter monitor 2021 Predominant NSR, occasional PVCs, no correlation with triggers   Echo 02/2019   1. Left ventricular ejection fraction, by visual estimation, is 50 to  55%. The left ventricle has normal function. There is no left ventricular  hypertrophy.   2. Global right ventricle has normal systolic function.The right  ventricular size is normal. No increase in right  ventricular wall  thickness.   3. Left atrial size was normal.   4. Right atrial size was normal.   5. The mitral valve is normal in structure. Trace mitral valve  regurgitation.   6. The tricuspid valve is normal in structure. Tricuspid valve  regurgitation is trivial.   7. The aortic valve is normal in structure. Aortic valve regurgitation is  not visualized.   8. The pulmonic valve was grossly normal. Pulmonic valve regurgitation is  not visualized.    Laboratory Data:  High Sensitivity Troponin:   Recent Labs  Lab 10/27/22 1249 10/27/22 1704 10/27/22 2210  TROPONINIHS 79* 83* 75*     Chemistry Recent Labs  Lab 10/27/22 1704 10/27/22 1804 10/28/22 0607 10/28/22 1335 10/29/22 0404 10/29/22 1505 10/30/22 0354  NA  --    < > 135 133* 137  --   --   K <2.0*   < > <2.0* 3.0* 2.5* 3.2* 4.0  CL  --    < > 89* 91* 97*  --   --   CO2  --    < > 35* 29 30  --   --   GLUCOSE  --    < > 124* 144* 111*  --   --   BUN  --    < > 19 16 12   --   --   CREATININE  --    < > 1.03* 1.16* 0.96  --   --   CALCIUM  --    < > 7.9* 7.9* 7.7*  --   --   MG 1.4*  --  2.6*  --   --   --   --   GFRNONAA  --    < > >60 >60 >60  --   --   ANIONGAP  --    < > 11 13 10   --   --    < > = values in this interval not displayed.    Recent Labs  Lab 10/28/22 0607 10/28/22 1335 10/29/22 0404  PROT 6.1* 6.2* 5.6*  ALBUMIN 2.7* 2.9* 2.7*  AST 58* 101* 77*  ALT 32 39 35  ALKPHOS 166* 178* 170*  BILITOT 2.8* 2.0* 1.1   Lipids  Recent Labs  Lab 10/27/22 1704  CHOL 200  TRIG 102  HDL 99  LDLCALC 81  CHOLHDL 2.0    Hematology Recent Labs  Lab 10/27/22 1249 10/28/22 0607 10/29/22 0404  WBC 20.8* 9.2 8.2  RBC 4.12 3.20* 2.66*  HGB 12.9 10.1* 8.5*  HCT 36.6 29.1* 24.3*  MCV 88.8 90.9 91.4  MCH 31.3 31.6 32.0  MCHC 35.2 34.7 35.0  RDW 14.2 14.4 14.6  PLT 337 274 242   Thyroid No results for input(s): "TSH", "FREET4" in the last 168 hours.  BNPNo results for input(s): "BNP",  "PROBNP" in the last 168 hours.  DDimer No results for input(s): "DDIMER" in the last 168 hours.   Radiology/Studies:  ECHOCARDIOGRAM COMPLETE  Result Date: 10/28/2022    ECHOCARDIOGRAM REPORT   Patient Name:   Katie Stanley Date of Exam: 10/28/2022 Medical Rec #:  [id number]        Height:       63.0 in Accession #:    [id number]       Weight:       168.0 lb Date of Birth:  Apr 04, 1986  BSA:          1.796 m Patient Age:    37 years         BP:           120/80 mmHg Patient Gender: F                HR:           100 bpm. Exam Location:  [facility] Procedure: 2D Echo and Strain Analysis Indications:     NSTEMI I21.4  History:         Patient has prior history of Echocardiogram examinations, most                  recent 03/10/2019.  Sonographer:     Overton Mam RDCS, FASE Referring Phys:  [id number] Francoise Schaumann [place] Diagnosing Phys: Armanda Magic MD  Sonographer Comments: Global longitudinal strain was attempted. IMPRESSIONS  1. Left ventricular ejection fraction, by estimation, is 60 to 65%. The left ventricle has normal function. The left ventricle has no regional wall motion abnormalities. There is severe concentric left ventricular hypertrophy. Left ventricular diastolic  parameters were normal. The average left ventricular global longitudinal strain is -16.4 %. The global longitudinal strain is normal.  2. Right ventricular systolic function is normal. The right ventricular size is normal. Tricuspid regurgitation signal is inadequate for assessing PA pressure.  3. There is a thin filamentous highly mobile density near the anterior mitral valve leaflet that likely represents a redundant chordae tendinae that protrudes into the LVOT. If any concern for endocarditis or bactermia is present recommend TEE.. The mitral valve is normal in structure. Trivial mitral valve regurgitation. No evidence of mitral stenosis.  4. The aortic valve is normal in structure. Aortic valve regurgitation is not visualized. No aortic  stenosis is present.  5. The inferior vena cava is normal in size with greater than 50% respiratory variability, suggesting right atrial pressure of 3 mmHg. FINDINGS  Left Ventricle: Left ventricular ejection fraction, by estimation, is 60 to 65%. The left ventricle has normal function. The left ventricle has no regional wall motion abnormalities. The average left ventricular global longitudinal strain is -16.4 %. The global longitudinal strain is normal. The left ventricular internal cavity size was normal in size. There is severe concentric left ventricular hypertrophy. Left ventricular diastolic parameters were normal. Normal left ventricular filling pressure. Right Ventricle: The right ventricular size is normal. No increase in right ventricular wall thickness. Right ventricular systolic function is normal. Tricuspid regurgitation signal is inadequate for assessing PA pressure. Left Atrium: Left atrial size was normal in size. Right Atrium: Right atrial size was normal in size. Pericardium: There is no evidence of pericardial effusion. Mitral Valve: There is a thin filamentous highly mobile density near the anterior mitral valve leaflet that likely represents a redundant chordae tendinae that protrudes into the LVOT. If any concern for endocarditis or bactermia is present recommend TEE. The mitral valve is normal in structure. Trivial mitral valve regurgitation. No evidence of mitral valve stenosis. Tricuspid Valve: The tricuspid valve is normal in structure. Tricuspid valve regurgitation is not demonstrated. No evidence of tricuspid stenosis. Aortic Valve: The aortic valve is normal in structure. Aortic valve regurgitation is not visualized. No aortic stenosis is present. Aortic valve peak gradient measures 12.2 mmHg. Pulmonic Valve: The pulmonic valve was normal in structure. Pulmonic valve regurgitation is not visualized. No evidence of pulmonic stenosis. Aorta: The aortic root is normal in size and  structure. Venous: The  inferior vena cava is normal in size with greater than 50% respiratory variability, suggesting right atrial pressure of 3 mmHg. IAS/Shunts: No atrial level shunt detected by color flow Doppler.  LEFT VENTRICLE PLAX 2D LVIDd:         3.90 cm   Diastology LVIDs:         2.70 cm   LV e' medial:    9.46 cm/s LV PW:         1.50 cm   LV E/e' medial:  13.4 LV IVS:        1.70 cm   LV e' lateral:   14.80 cm/s LVOT diam:     1.90 cm   LV E/e' lateral: 8.6 LV SV:         84 LV SV Index:   47        2D Longitudinal Strain LVOT Area:     2.84 cm  2D Strain GLS (A2C):   -24.4 %                          2D Strain GLS (A3C):   -14.2 %                          2D Strain GLS (A4C):   -10.7 %                          2D Strain GLS Avg:     -16.4 % RIGHT VENTRICLE RV Basal diam:  2.00 cm RV S prime:     14.70 cm/s TAPSE (M-mode): 1.8 cm LEFT ATRIUM             Index        RIGHT ATRIUM           Index LA diam:        3.50 cm 1.95 cm/m   RA Area:     12.90 cm LA Vol (A2C):   40.5 ml 22.56 ml/m  RA Volume:   29.10 ml  16.21 ml/m LA Vol (A4C):   31.8 ml 17.71 ml/m LA Biplane Vol: 38.0 ml 21.16 ml/m  AORTIC VALVE                  PULMONIC VALVE AV Area (Vmax): 2.53 cm      PV Vmax:        1.56 m/s AV Vmax:        175.00 cm/s   PV Peak grad:   9.7 mmHg AV Peak Grad:   12.2 mmHg     RVOT Peak grad: 8 mmHg LVOT Vmax:      156.00 cm/s LVOT Vmean:     105.000 cm/s LVOT VTI:       0.296 m  AORTA Ao Root diam: 3.10 cm Ao Asc diam:  2.70 cm MITRAL VALVE MV Area (PHT): 6.37 cm     SHUNTS MV Decel Time: 119 msec     Systemic VTI:  0.30 m MV E velocity: 127.00 cm/s  Systemic Diam: 1.90 cm MV A velocity: 123.00 cm/s MV E/A ratio:  1.03 Armanda Magic MD Electronically signed by Armanda Magic MD Signature Date/Time: 10/28/2022/5:41:31 PM    Final    CT ABDOMEN PELVIS WO CONTRAST  Result Date: 10/27/2022 CLINICAL DATA:  Abdominal pain EXAM: CT ABDOMEN AND PELVIS WITHOUT CONTRAST TECHNIQUE: Multidetector CT imaging of  the abdomen and pelvis was performed  following the standard protocol without IV contrast. RADIATION DOSE REDUCTION: This exam was performed according to the departmental dose-optimization program which includes automated exposure control, adjustment of the mA and/or kV according to patient size and/or use of iterative reconstruction technique. COMPARISON:  06/21/2022 FINDINGS: Lower chest: Unremarkable. Hepatobiliary: Liver measures 20.3 cm in length. There is fatty infiltration. There is interval decrease in severity of fatty infiltration in comparison with the previous study. There is mild nodularity of the liver surface. Surgical clips are seen in gallbladder fossa. There is no dilation of bile ducts. Pancreas: There is 2.5 cm low-density in head of the pancreas which was not seen in the previous examination. No focal abnormalities are seen in body and tail of pancreas. Spleen: Unremarkable. Adrenals/Urinary Tract: Adrenals are unremarkable. There is no hydronephrosis. There are few small calcific densities in both kidneys. Ureters are not dilated. Urinary bladder is not distended. Stomach/Bowel: Stomach is unremarkable. Small bowel loops are not dilated. Appendix is not dilated. There is mild diffuse wall thickening in terminal ileum. There is marked wall thickening in cecum and ascending colon. There is mild-to-moderate wall thickening in transverse, descending and sigmoid colon. There is no loculated pericolic fluid collection. Vascular/Lymphatic: Vascular structures are unremarkable. There is stranding in the fat planes in the mesentery along with subcentimeter nodes in the mesentery. Reproductive: IUD is seen in uterus. Other: There is no ascites or pneumoperitoneum. Umbilical hernia containing fat is seen. Musculoskeletal: No acute findings are seen in bony structures. IMPRESSION: Enlarged fatty liver. There is nodularity of the liver surface suggesting possible cirrhosis. There is 2.5 cm low-density lesion  in the head of the pancreas. This may suggest inflammatory or neoplastic process. Please correlate with laboratory findings and consider short-term follow-up CT and MRI as warranted. There is thickening in colon, especially in the right colon suggesting chronic inflammatory or infectious colitis. There is wall thickening in terminal ileum, possibly due to chronic inflammation. Appendix is not dilated. There is stranding in the mesenteric fat along with subcentimeter nodes in mesentery suggesting nonspecific inflammation. Stranding is particularly prominent in right lower quadrant of abdomen medial to the ileocecal junction. There is no loculated fluid collection in the mesentery. There is no evidence of intestinal obstruction or pneumoperitoneum. There is no hydronephrosis. There are a few small bilateral renal stones. Electronically Signed   By: Ernie Avena M.D.   On: 10/27/2022 19:22   DG Chest 2 View  Result Date: 10/27/2022 CLINICAL DATA:  Chest pain. EXAM: CHEST - 2 VIEW COMPARISON:  June 21, 2022. FINDINGS: The heart size and mediastinal contours are within normal limits. Both lungs are clear. The visualized skeletal structures are unremarkable. IMPRESSION: No active cardiopulmonary disease. Electronically Signed   By: Lupita Raider M.D.   On: 10/27/2022 13:20     Assessment and Plan:   Abnormal echo - echo showed LVEF 60-655, no WMA, normal RVSF, thin filamentous highly mobile density near the anterior mitral valve that likely represents a redundant chordae tendinae that protrudes into the LVOT, possible endocarditis or bacteremia. Recommend TEE - no sings or sxs of infection - UDS with tricyclic, opiate, cannabinoid - can get TEE during admission, will discuss with MD  Prolonged Qtc - EKG showed Qtc 619 ms in the setting of severe hypokalemia - recheck Qtc  Elevated troponin - HS trop minimally elevated with flat trend - no chest pain reported - suspect supply demand  mismatch - echo as above - no h/o ischemic work-up - may need  cardiac CTA - start ASA 81mg  daily - lipids showed LDL 81mg  daily. Would benefit form statin, also with diabetes. Although AST abnormal  UTI - abx per IM  AKI - resolved  Alcohol use - CIWA per IM - cessation advised  For questions or updates, please contact [facility] Please consult [email or web address] for contact info under    Signed, Chantrell Apsey David Stall, PA-C  10/30/2022 12:41 PM

## 2022-10-30 NOTE — Progress Notes (Signed)
PHARMACIST - PHYSICIAN COMMUNICATION  DR:   Margo Aye  CONCERNING: IV to Oral Route Change Policy  RECOMMENDATION: This patient is receiving pantoprazole by the intravenous route.  Based on criteria approved by the Pharmacy and Therapeutics Committee, the intravenous medication(s) is/are being converted to the equivalent oral dose form(s).   DESCRIPTION: These criteria include: The patient is eating (either orally or via tube) and/or has been taking other orally administered medications for a least 24 hours The patient has no evidence of active gastrointestinal bleeding or impaired GI absorption (gastrectomy, short bowel, patient on TNA or NPO).  If you have questions about this conversion, please contact the Pharmacy Department  []   [phone] )  [facility] [x]   [phone] )  [facility] []   [phone] )  [facility] []   [phone] )  [facility] []   [phone] )  [facility]   Marlise Fahr Rodriguez-Guzman PharmD, BCPS 10/30/2022 1:55 PM

## 2022-10-30 NOTE — Plan of Care (Signed)

## 2022-10-31 DIAGNOSIS — E876 Hypokalemia: Secondary | ICD-10-CM | POA: Diagnosis not present

## 2022-10-31 MED ORDER — DICYCLOMINE HCL 10 MG PO CAPS
10.0000 mg | ORAL_CAPSULE | Freq: Three times a day (TID) | ORAL | Status: AC
  Administered 2022-10-31 – 2022-11-01 (×3): 10 mg via ORAL
  Filled 2022-10-31 (×3): qty 1

## 2022-10-31 MED ORDER — TAB-A-VITE/IRON PO TABS
1.0000 | ORAL_TABLET | Freq: Every day | ORAL | Status: DC
  Administered 2022-10-31 – 2022-11-01 (×2): 1 via ORAL
  Filled 2022-10-31 (×2): qty 1

## 2022-10-31 MED ORDER — ORAL CARE MOUTH RINSE: 15.0000 mL | OROMUCOSAL | Status: DC | PRN

## 2022-10-31 MED ORDER — VITAMIN B-12 1000 MCG PO TABS
1000.0000 ug | ORAL_TABLET | Freq: Two times a day (BID) | ORAL | Status: AC
  Administered 2022-10-31 (×2): 1000 ug via ORAL
  Filled 2022-10-31 (×2): qty 1

## 2022-10-31 MED ORDER — FOLIC ACID 1 MG PO TABS
1.0000 mg | ORAL_TABLET | Freq: Every day | ORAL | Status: DC
  Administered 2022-10-31 – 2022-11-01 (×2): 1 mg via ORAL
  Filled 2022-10-31 (×2): qty 1

## 2022-10-31 MED ORDER — ENSURE ENLIVE PO LIQD
237.0000 mL | Freq: Two times a day (BID) | ORAL | Status: DC
  Administered 2022-10-31 – 2022-11-01 (×2): 237 mL via ORAL

## 2022-10-31 NOTE — Plan of Care (Signed)

## 2022-10-31 NOTE — Progress Notes (Signed)
Progress Note   Patient: Katie Stanley AVW:[id number] DOB: 07/08/1985 DOA: 10/27/2022     4 DOS: the patient was seen and examined on 10/31/2022   Brief hospital course:  37 y.o. female with medical history significant of Alcohol abuse, palpitations, GERD, tobacco abuse, substance abuse presenting w/ etoh abuse, hypokalemia, elevated troponin, uti. Pt reports drinking at least 1/5 of liquor daily.  Patient reports progressive onset of recurrent nausea and vomiting over the past 24 hours.  No reported bloody or bilious emesis.  Positive generalized abdominal pain.  Mild dysuria and increased urinary frequency.  Positive palpitations.  No frank chest pain.  No frank shortness of breath.  Smokes around 1/2 to 1 pack/day.  Does desire to quit drinking.  Nausea vomiting have been getting progressively worse over the past 12 to 24 hours.  Has had admissions for similar issues in the past. Presented to the ER afebrile, hemodynamically stable.  White count 20.8, hemoglobin 13, platelets 337, creatinine 1.28, glucose 142, potassium less than 2.  Magnesium level pending.  EKG with accelerated junctional rhythm and noted QT around 600.  Chest x-ray stable.  10/28/2022 : Patient symptomatically improving.  However persistent hypokalemia being repleted.  Abdominal CT showed 2.5 cm low-density lesion in the head of pancreas lipase normal will need reevaluation in the near future.  Patient continues to stay on CIWA protocol.   10/29/2022: The patient was seen and examined at bedside, this morning.  She is hypersomnolent.  Denies any new complaints.  Venous blood gas obtained.  Refractory hypokalemia on lab work.  10/30/2022: The patient was seen and examined at bedside.  Denies any anginal symptoms at the time of this visit.  Cardiology consulted due to abnormal echo.  10/31/2022: The patient was seen and examined at bedside.  Reports numbness in her lower abdomen and proximal muscles of the lower  extremities.  Assessment and Plan: Severe hypokalemia, resolved, post repletion K <2 >>>4.0 Magn 2.6 from 1.4  Elevated troponin -mildly elevated troponins which has plateaued likely from demand.  Echo pending Trop 70s on presentation in setting of ETOH abuse, nausea/vomiting/, ETOH abuse  Abnormal 2D echo  highly mobile density near the anterior mitral valve that likely represents a redundant chordae tendineae that protrudes into the LVOT, possible endocarditis or bacteremia.  Per cardiology less likely endocarditis.  Proximal muscles of the lower extremities numbness/lower abdomen numbness Unclear etiology. May consider neurology follow-up outpatient- Counseled on the importance of complete alcohol cessation. Multivitamin, folic acid, thiamine, vitamin B12 supplements  Prolonged Qtc >600 In the setting of severe hypokalemia Avoid QTc prolonging agents Optimize magnesium level greater than 2.0 Optimize potassium level greater than 4.0  Abdominal pain -CT abdomen showing  2.5 cm low-density lesion in the head of the pancreas. This may suggest inflammatory or neoplastic process.  Lipase normal.  Will need reevaluation in 4 to 6 weeks.  Nonspecific abd pain in setting of ETOH abuse, recurrent nausea/vomiting and ? UTI  IV PPI for gastritis coverage  Antiemetic  IV rocephin for UTI coverage   Ruled out UTI (urinary tract infection) + dysuria, increased urinary frequency w/ noted WBC 20  UA pending  Will empirically cover w/ IV rocephin pending urine culture  Follow closely  AKI (acute kidney injury) - improving  Creatinine 1.28 with a GFR in the 50s Likely prerenal etiology in the setting of intractable nausea and vomiting IV fluid hydration Hold offending agents Monitor for now  Primary hypertension BP stable  Titrate home regimen  Alcohol abuse 1/5th liquor drinker daily   On CIWA protocol  Alcohol cessation counseling  History of drug abuse in remission  UDS  pending   Overweight BMI 29 Recommend weight loss outpatient with regular physical activity and healthy diet  Resolved.  Renal AKI with IV fluid DC'd IV fluid on 10/30/2022. Renal function is back to normal Continue to avoid nephrotoxic agents.       Physical Exam: Vitals:   10/30/22 2332 10/31/22 0402 10/31/22 0819 10/31/22 1131  BP: 123/84 124/82 (!) 137/93 (!) 141/99  Pulse: 95 90 94 90  Resp: 17  20 19   Temp: 98.8 F (37.1 C) 98.9 F (37.2 C) 97.8 F (36.6 C) 98.8 F (37.1 C)  TempSrc:  Oral    SpO2: 99% 99% 100% 100%  Weight:      Height:       Physical Exam Constitutional:      Appearance: Overweight in no apparent distress. HENT:     Head: Normocephalic and atraumatic. Eyes:     Pupils: Pupils are equal, round, and reactive to light.  Cardiovascular:     Rate and Rhythm: Regular rate and rhythm no rubs or gallops.    Pulses: Normal pulses.  Pulmonary:     Effort: Clear to auscultation no wheezes or rales.    Breath sounds: No stridor.  Abdominal:     Palpations: Abdomen is soft.  Musculoskeletal:        General: No swelling.     Cervical back: Normal range of motion.  Skin:    General: Skin is warm.  Neurological:     General: No focal deficit present.     Mental Status: Alert oriented x 3. Psychiatric:     Comments: Flat affect         Data Reviewed:  Results are pending, will review when available.  Family Communication: None by bedside   Disposition: Status is: Inpatient Remains inpatient appropriate because: Hypokalemia  Planned Discharge Destination: Home    Time spent: 32 minutes  Author: Darlin Drop, DO 10/31/2022 3:44 PM  For on call review [email or web address].

## 2022-10-31 NOTE — Plan of Care (Signed)

## 2022-10-31 NOTE — Progress Notes (Addendum)
Initial Nutrition Assessment  DOCUMENTATION CODES:   Not applicable  INTERVENTION:   Ensure Enlive po BID, each supplement provides 350 kcal and 20 grams of protein.  MVI, folic acid and thiamine po daily  Liberalize diet   Pt at high refeed risk; recommend monitor potassium, magnesium and phosphorus labs daily until stable  NUTRITION DIAGNOSIS:   Unintentional weight loss related to social / environmental circumstances (etoh abuse) as evidenced by 23 percent weight loss in 4 months.  GOAL:   Patient will meet greater than or equal to 90% of their needs  MONITOR:   PO intake, Supplement acceptance, Labs, Weight trends, I & O's, Skin  REASON FOR ASSESSMENT:   Malnutrition Screening Tool    ASSESSMENT:   37 y/o female with h/o substance abuse, etoh abuse, HTN, anxiety, depression, GERD and hepatitis C who is admitted with UTI, AKI and electrolyte abnormality. Pt also noted to have new pancreatic lesion.  Met with pt in room today. Pt reports decreased appetite and oral intake for several months r/t etoh abuse; pt reports that she has been mostly drinking and not eating. Per chart, pt has also been being followed by GI for chronic diarrhea and abdominal pain that started in January. Pt s/p EGD/colonoscopy with [facility]. RD unable to find if pancreatic elastase has been checked. Pt reports that her appetite and oral intake is improved in hospital; pt documented to be eating 70-100% of meals. RD discussed with pt the importance of adequate nutrition needed to preserve lean muscle. Pt is willing to drink chocolate or vanilla Ensure in hospital. RD will add supplements to help pt meet her estimated needs. Pt is at high refeed risk. Pt reports significant weight loss over the past several months; pt reports her UBW is ~210lbs. Per chart, pt is down 48lbs(23%) over the past 4 months; this is severe weight loss. Pt is at high risk for developing malnutrition.   Medications reviewed and  include: aspirin, B12, lovenox, folic acid, MVI, thiamine   Labs reviewed: K 4.0 wnl Hgb 8.5(L), Hct 24.3(L)  NUTRITION - FOCUSED PHYSICAL EXAM:  Flowsheet Row Most Recent Value  Orbital Region No depletion  Upper Arm Region No depletion  Thoracic and Lumbar Region No depletion  Buccal Region No depletion  Temple Region No depletion  Clavicle Bone Region No depletion  Clavicle and Acromion Bone Region No depletion  Scapular Bone Region No depletion  Dorsal Hand No depletion  Patellar Region No depletion  Anterior Thigh Region No depletion  Posterior Calf Region No depletion  Edema (RD Assessment) Mild  Hair Reviewed  Eyes Reviewed  Mouth Reviewed  Skin Reviewed  Nails Reviewed   Diet Order:   Diet Order             Diet regular Room service appropriate? Yes; Fluid consistency: Thin  Diet effective now                  EDUCATION NEEDS:   Education needs have been addressed  Skin:  Skin Assessment: Reviewed RN Assessment  Last BM:  7/19  Height:   Ht Readings from Last 1 Encounters:  10/27/22 5\' 3"  (1.6 m)    Weight:   Wt Readings from Last 1 Encounters:  10/27/22 76.2 kg    Ideal Body Weight:  52.3 kg  BMI:  Body mass index is 29.76 kg/m.  Estimated Nutritional Needs:   Kcal:  1700-1900kcal/day  Protein:  85-95g/day  Fluid:  1.7-1.9L/day  Betsey Holiday MS, RD,  LDN Please refer to [facility] for RD and/or RD on-call/weekend/after hours pager

## 2022-11-01 DIAGNOSIS — E876 Hypokalemia: Secondary | ICD-10-CM | POA: Diagnosis not present

## 2022-11-01 LAB — BASIC METABOLIC PANEL
Anion gap: 7 (ref 5–15)
BUN: 7 mg/dL (ref 6–20)
CO2: 29 mmol/L (ref 22–32)
Calcium: 8.3 mg/dL — ABNORMAL LOW (ref 8.9–10.3)
Chloride: 103 mmol/L (ref 98–111)
Creatinine, Ser: 0.83 mg/dL (ref 0.44–1.00)
GFR, Estimated: >60 mL/min (ref >60)
Glucose, Bld: 102 mg/dL — ABNORMAL HIGH (ref 70–99)
Potassium: 3.3 mmol/L — ABNORMAL LOW (ref 3.5–5.1)
Sodium: 139 mmol/L (ref 135–145)

## 2022-11-01 LAB — VITAMIN B12: Vitamin B-12: 781 pg/mL (ref 180–914)

## 2022-11-01 LAB — PHOSPHORUS: Phosphorus: 2.9 mg/dL (ref 2.5–4.6)

## 2022-11-01 LAB — MAGNESIUM: Magnesium: 1.3 mg/dL — ABNORMAL LOW (ref 1.7–2.4)

## 2022-11-01 MED ORDER — POTASSIUM CHLORIDE CRYS ER 20 MEQ PO TBCR
40.0000 meq | EXTENDED_RELEASE_TABLET | Freq: Once | ORAL | Status: AC
  Administered 2022-11-01: 40 meq via ORAL
  Filled 2022-11-01: qty 2

## 2022-11-01 MED ORDER — MAGNESIUM SULFATE 4 GM/100ML IV SOLN
4.0000 g | Freq: Once | INTRAVENOUS | Status: AC
  Administered 2022-11-01: 4 g via INTRAVENOUS
  Filled 2022-11-01: qty 100

## 2022-11-01 MED ORDER — PANTOPRAZOLE SODIUM 40 MG PO TBEC: 40.0000 mg | DELAYED_RELEASE_TABLET | Freq: Two times a day (BID) | ORAL | 0 refills | Status: DC

## 2022-11-01 MED ORDER — MAGNESIUM SULFATE 2 GM/50ML IV SOLN: 2.0000 g | Freq: Once | INTRAVENOUS | Status: DC

## 2022-11-01 MED ORDER — FAMOTIDINE 20 MG PO TABS: 20.0000 mg | ORAL_TABLET | Freq: Two times a day (BID) | ORAL | 0 refills | Status: DC

## 2022-11-01 MED ORDER — DICYCLOMINE HCL 10 MG PO CAPS: 10.0000 mg | ORAL_CAPSULE | Freq: Three times a day (TID) | ORAL | 0 refills | Status: DC | PRN

## 2022-11-01 NOTE — Plan of Care (Signed)

## 2022-11-01 NOTE — Plan of Care (Signed)
  Problem: Education: Goal: Knowledge of General Education information will improve Description: Including pain rating scale, medication(s)/side effects and non-pharmacologic comfort measures Outcome: Progressing   Problem: Clinical Measurements: Goal: Ability to maintain clinical measurements within normal limits will improve Outcome: Progressing Goal: Will remain free from infection Outcome: Progressing Goal: Respiratory complications will improve Outcome: Progressing Goal: Cardiovascular complication will be avoided Outcome: Progressing   Problem: Activity: Goal: Risk for activity intolerance will decrease Outcome: Progressing   Problem: Nutrition: Goal: Adequate nutrition will be maintained Outcome: Progressing   Problem: Elimination: Goal: Will not experience complications related to bowel motility Outcome: Progressing Goal: Will not experience complications related to urinary retention Outcome: Progressing   Problem: Safety: Goal: Ability to remain free from injury will improve Outcome: Progressing   Problem: Skin Integrity: Goal: Risk for impaired skin integrity will decrease Outcome: Progressing

## 2022-11-01 NOTE — Plan of Care (Signed)
  Problem: Education: Goal: Knowledge of General Education information will improve Description: Including pain rating scale, medication(s)/side effects and non-pharmacologic comfort measures 11/01/2022 1432 by Latanya Maudlin, RN Outcome: Completed/Met 11/01/2022 1256 by Latanya Maudlin, RN Outcome: Adequate for Discharge   Problem: Health Behavior/Discharge Planning: Goal: Ability to manage health-related needs will improve 11/01/2022 1432 by Latanya Maudlin, RN Outcome: Completed/Met 11/01/2022 1256 by Latanya Maudlin, RN Outcome: Adequate for Discharge   Problem: Clinical Measurements: Goal: Ability to maintain clinical measurements within normal limits will improve 11/01/2022 1432 by Latanya Maudlin, RN Outcome: Completed/Met 11/01/2022 1256 by Latanya Maudlin, RN Outcome: Adequate for Discharge Goal: Will remain free from infection 11/01/2022 1432 by Latanya Maudlin, RN Outcome: Completed/Met 11/01/2022 1256 by Latanya Maudlin, RN Outcome: Adequate for Discharge Goal: Diagnostic test results will improve 11/01/2022 1432 by Latanya Maudlin, RN Outcome: Completed/Met 11/01/2022 1256 by Latanya Maudlin, RN Outcome: Adequate for Discharge Goal: Respiratory complications will improve 11/01/2022 1432 by Latanya Maudlin, RN Outcome: Completed/Met 11/01/2022 1256 by Latanya Maudlin, RN Outcome: Adequate for Discharge Goal: Cardiovascular complication will be avoided 11/01/2022 1432 by Latanya Maudlin, RN Outcome: Completed/Met 11/01/2022 1256 by Latanya Maudlin, RN Outcome: Adequate for Discharge   Problem: Activity: Goal: Risk for activity intolerance will decrease 11/01/2022 1432 by Latanya Maudlin, RN Outcome: Completed/Met 11/01/2022 1256 by Latanya Maudlin, RN Outcome: Adequate for Discharge   Problem: Nutrition: Goal: Adequate nutrition will be maintained 11/01/2022 1432 by Latanya Maudlin, RN Outcome: Completed/Met 11/01/2022 1256 by Latanya Maudlin,  RN Outcome: Adequate for Discharge   Problem: Coping: Goal: Level of anxiety will decrease 11/01/2022 1432 by Latanya Maudlin, RN Outcome: Completed/Met 11/01/2022 1256 by Latanya Maudlin, RN Outcome: Adequate for Discharge   Problem: Elimination: Goal: Will not experience complications related to bowel motility 11/01/2022 1432 by Latanya Maudlin, RN Outcome: Completed/Met 11/01/2022 1256 by Latanya Maudlin, RN Outcome: Adequate for Discharge Goal: Will not experience complications related to urinary retention 11/01/2022 1432 by Latanya Maudlin, RN Outcome: Completed/Met 11/01/2022 1256 by Latanya Maudlin, RN Outcome: Adequate for Discharge   Problem: Pain Managment: Goal: General experience of comfort will improve 11/01/2022 1432 by Latanya Maudlin, RN Outcome: Completed/Met 11/01/2022 1256 by Latanya Maudlin, RN Outcome: Adequate for Discharge   Problem: Safety: Goal: Ability to remain free from injury will improve 11/01/2022 1432 by Latanya Maudlin, RN Outcome: Completed/Met 11/01/2022 1256 by Latanya Maudlin, RN Outcome: Adequate for Discharge   Problem: Skin Integrity: Goal: Risk for impaired skin integrity will decrease 11/01/2022 1432 by Latanya Maudlin, RN Outcome: Completed/Met 11/01/2022 1256 by Latanya Maudlin, RN Outcome: Adequate for Discharge

## 2022-11-01 NOTE — TOC CM/SW Note (Signed)
Transition of Care [facility]) - Inpatient Brief Assessment   Patient Details  Name: Katie Stanley MRN: [id number] Date of Birth: 1986-02-04  Transition of Care [facility]) CM/SW Contact:    Margarito Liner, LCSW Phone Number: 11/01/2022, 12:44 PM   Clinical Narrative: Patient has orders to discharge home today. No other needs identified. CSW signing off.  Transition of Care Asessment: Insurance and Status: Insurance coverage has been reviewed Patient has primary care physician: Yes Home environment has been reviewed: Single family home Prior level of function:: Not documented Prior/Current Home Services: No current home services Social Determinants of Health Reivew: SDOH reviewed no interventions necessary Readmission risk has been reviewed: Yes Transition of care needs: no transition of care needs at this time

## 2022-11-01 NOTE — Discharge Summary (Signed)
Physician Discharge Summary  Katie Stanley WUJ:[id number] DOB: Nov 07, 1985 DOA: 10/27/2022  PCP: Dorcas Carrow, DO  Admit date: 10/27/2022 Discharge date: 11/01/2022  Admitted From: Home Disposition:  Home  Recommendations for Outpatient Follow-up:  Follow up with PCP in 1-2 weeks Recommend establish AA program  Home Health: No Equipment/Devices: None  Discharge Condition: Stable CODE STATUS: Full Diet recommendation: Regular  Brief/Interim Summary:  37 y.o. female with medical history significant of Alcohol abuse, palpitations, GERD, tobacco abuse, substance abuse presenting w/ etoh abuse, hypokalemia, elevated troponin, uti. Pt reports drinking at least 1/5 of liquor daily.  Patient reports progressive onset of recurrent nausea and vomiting over the past 24 hours.  No reported bloody or bilious emesis.  Positive generalized abdominal pain.  Mild dysuria and increased urinary frequency.  Positive palpitations.  No frank chest pain.  No frank shortness of breath.  Smokes around 1/2 to 1 pack/day.  Does desire to quit drinking.  Nausea vomiting have been getting progressively worse over the past 12 to 24 hours.  Has had admissions for similar issues in the past. Presented to the ER afebrile, hemodynamically stable.  White count 20.8, hemoglobin 13, platelets 337, creatinine 1.28, glucose 142, potassium less than 2.  Magnesium level pending.  EKG with accelerated junctional rhythm and noted QT around 600.  Chest x-ray stable.   10/28/2022 : Patient symptomatically improving.  However persistent hypokalemia being repleted.  Abdominal CT showed 2.5 cm low-density lesion in the head of pancreas lipase normal will need reevaluation in the near future.  Patient continues to stay on CIWA protocol.    10/29/2022: The patient was seen and examined at bedside, this morning.  She is hypersomnolent.  Denies any new complaints.  Venous blood gas obtained.  Refractory hypokalemia on lab work.    10/30/2022: The patient was seen and examined at bedside.  Denies any anginal symptoms at the time of this visit.  Cardiology consulted due to abnormal echo.   10/31/2022: The patient was seen and examined at bedside.  Reports numbness in her lower abdomen and proximal muscles of the lower extremities.  7/24: Lengthy discussion with patient at bedside.  Likely electrolyte abnormalities, abdominal pain, weakness all related to chronic alcohol use.  Lengthy discussion on cessation.  Recommend to establish with a local AA program.  AA resources added to AVS.  Stable for discharge.   Discharge Diagnoses:  Principal Problem:   Hypokalemia Active Problems:   Elevated troponin   UTI (urinary tract infection)   Abdominal pain   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HCC)   Alcohol abuse   Primary hypertension   AKI (acute kidney injury) (HCC)  Severe hypokalemia, resolved, post repletion Remains a little low on discharge.  Recommend adequate dietary intake and abstinence from alcohol.   Elevated troponin -mildly elevated troponins which has plateaued likely from demand.  Echo pending Trop 70s on presentation in setting of ETOH abuse, nausea/vomiting/, ETOH abuse  Abnormal 2D echo  highly mobile density near the anterior mitral valve that likely represents a redundant chordae tendineae that protrudes into the LVOT, possible endocarditis or bacteremia.  Per cardiology less likely endocarditis.  No clinical indications of infective endocarditis in time of discharge   Proximal muscles of the lower extremities numbness/lower abdomen numbness Suspect related to to alcohol use.  Counseled on importance of dietary monitoring and consumption of healthy calories.  Absence of alcohol reinforced   Prolonged Qtc >600 In the setting of severe hypokalemia Avoid QTc prolonging  agents    Abdominal pain -CT abdomen showing  2.5 cm low-density lesion in the head of the pancreas. This may suggest inflammatory or  neoplastic process.  Lipase normal.  Will need reevaluation in 4 to 6 weeks.  Needs follow-up with outpatient PCP   Nonspecific abd pain in setting of ETOH abuse, recurrent nausea/vomiting and ? UTI  Recommend PPI and H2 blocker at time of discharge.  No indication for antibiotics.  UTI ruled out.   Ruled out UTI (urinary tract infection) Indication for antibiotics   AKI (acute kidney injury) - improving  Creatinine at or near baseline at time of discharge   Primary hypertension BP stable  Titrate home regimen    Alcohol abuse 1/5th liquor drinker daily   On CIWA protocol  Alcohol cessation counseling     Overweight BMI 29 Recommend weight loss outpatient with regular physical activity and healthy diet    Discharge Instructions  Discharge Instructions     Diet - low sodium heart healthy   Complete by: As directed    Increase activity slowly   Complete by: As directed       Allergies as of 11/01/2022       Reactions   Penicillins Swelling        Medication List     STOP taking these medications    CLEAR EYES OP   naltrexone 50 MG tablet Commonly known as: DEPADE   predniSONE 10 MG (21) Tbpk tablet Commonly known as: STERAPRED UNI-PAK 21 TAB       TAKE these medications    citalopram 20 MG tablet Commonly known as: CELEXA TAKE 1 TABLET(20 MG) BY MOUTH DAILY   dicyclomine 10 MG capsule Commonly known as: BENTYL Take 1 capsule (10 mg total) by mouth 3 (three) times daily as needed for spasms.   famotidine 20 MG tablet Commonly known as: PEPCID Take 1 tablet (20 mg total) by mouth 2 (two) times daily.   lidocaine 5 % Commonly known as: Lidoderm Place 1 patch onto the skin daily. Remove & Discard patch within 12 hours or as directed by MD   lisinopril 10 MG tablet Commonly known as: ZESTRIL Take 1 tablet (10 mg total) by mouth daily.   Magnesium 250 MG Tabs Take 1 tablet (250 mg total) by mouth 2 (two) times daily.   methocarbamol 500 MG  tablet Commonly known as: ROBAXIN Take 1 tablet (500 mg total) by mouth 2 (two) times daily.   metoprolol succinate 25 MG 24 hr tablet Commonly known as: TOPROL-XL Take 1 tablet (25 mg total) by mouth daily.   naproxen 500 MG tablet Commonly known as: NAPROSYN Take 1 tablet (500 mg total) by mouth 2 (two) times daily with a meal.   pantoprazole 40 MG tablet Commonly known as: PROTONIX Take 1 tablet (40 mg total) by mouth 2 (two) times daily before a meal. What changed:  how much to take when to take this   potassium chloride SA 20 MEQ tablet Commonly known as: KLOR-CON M Take 1 tablet (20 mEq total) by mouth daily.   sucralfate 1 GM/10ML suspension Commonly known as: Carafate Take 10 mLs (1 g total) by mouth 4 (four) times daily -  with meals and at bedtime.        Allergies  Allergen Reactions   Penicillins Swelling    Consultations: None   Procedures/Studies: ECHOCARDIOGRAM COMPLETE  Result Date: 10/28/2022    ECHOCARDIOGRAM REPORT   Patient Name:   Katie Stanley  Date of Exam: 10/28/2022 Medical Rec #:  [id number]        Height:       63.0 in Accession #:    [id number]       Weight:       168.0 lb Date of Birth:  02/08/1986         BSA:          1.796 m Patient Age:    37 years         BP:           120/80 mmHg Patient Gender: F                HR:           100 bpm. Exam Location:  [facility] Procedure: 2D Echo and Strain Analysis Indications:     NSTEMI I21.4  History:         Patient has prior history of Echocardiogram examinations, most                  recent 03/10/2019.  Sonographer:     Overton Mam RDCS, FASE Referring Phys:  [id number] Francoise Schaumann [place] Diagnosing Phys: Armanda Magic MD  Sonographer Comments: Global longitudinal strain was attempted. IMPRESSIONS  1. Left ventricular ejection fraction, by estimation, is 60 to 65%. The left ventricle has normal function. The left ventricle has no regional wall motion abnormalities. There is severe concentric left ventricular  hypertrophy. Left ventricular diastolic  parameters were normal. The average left ventricular global longitudinal strain is -16.4 %. The global longitudinal strain is normal.  2. Right ventricular systolic function is normal. The right ventricular size is normal. Tricuspid regurgitation signal is inadequate for assessing PA pressure.  3. There is a thin filamentous highly mobile density near the anterior mitral valve leaflet that likely represents a redundant chordae tendinae that protrudes into the LVOT. If any concern for endocarditis or bactermia is present recommend TEE.. The mitral valve is normal in structure. Trivial mitral valve regurgitation. No evidence of mitral stenosis.  4. The aortic valve is normal in structure. Aortic valve regurgitation is not visualized. No aortic stenosis is present.  5. The inferior vena cava is normal in size with greater than 50% respiratory variability, suggesting right atrial pressure of 3 mmHg. FINDINGS  Left Ventricle: Left ventricular ejection fraction, by estimation, is 60 to 65%. The left ventricle has normal function. The left ventricle has no regional wall motion abnormalities. The average left ventricular global longitudinal strain is -16.4 %. The global longitudinal strain is normal. The left ventricular internal cavity size was normal in size. There is severe concentric left ventricular hypertrophy. Left ventricular diastolic parameters were normal. Normal left ventricular filling pressure. Right Ventricle: The right ventricular size is normal. No increase in right ventricular wall thickness. Right ventricular systolic function is normal. Tricuspid regurgitation signal is inadequate for assessing PA pressure. Left Atrium: Left atrial size was normal in size. Right Atrium: Right atrial size was normal in size. Pericardium: There is no evidence of pericardial effusion. Mitral Valve: There is a thin filamentous highly mobile density near the anterior mitral valve  leaflet that likely represents a redundant chordae tendinae that protrudes into the LVOT. If any concern for endocarditis or bactermia is present recommend TEE. The mitral valve is normal in structure. Trivial mitral valve regurgitation. No evidence of mitral valve stenosis. Tricuspid Valve: The tricuspid valve is normal in structure. Tricuspid valve regurgitation is not demonstrated. No evidence of tricuspid stenosis.  Aortic Valve: The aortic valve is normal in structure. Aortic valve regurgitation is not visualized. No aortic stenosis is present. Aortic valve peak gradient measures 12.2 mmHg. Pulmonic Valve: The pulmonic valve was normal in structure. Pulmonic valve regurgitation is not visualized. No evidence of pulmonic stenosis. Aorta: The aortic root is normal in size and structure. Venous: The inferior vena cava is normal in size with greater than 50% respiratory variability, suggesting right atrial pressure of 3 mmHg. IAS/Shunts: No atrial level shunt detected by color flow Doppler.  LEFT VENTRICLE PLAX 2D LVIDd:         3.90 cm   Diastology LVIDs:         2.70 cm   LV e' medial:    9.46 cm/s LV PW:         1.50 cm   LV E/e' medial:  13.4 LV IVS:        1.70 cm   LV e' lateral:   14.80 cm/s LVOT diam:     1.90 cm   LV E/e' lateral: 8.6 LV SV:         84 LV SV Index:   47        2D Longitudinal Strain LVOT Area:     2.84 cm  2D Strain GLS (A2C):   -24.4 %                          2D Strain GLS (A3C):   -14.2 %                          2D Strain GLS (A4C):   -10.7 %                          2D Strain GLS Avg:     -16.4 % RIGHT VENTRICLE RV Basal diam:  2.00 cm RV S prime:     14.70 cm/s TAPSE (M-mode): 1.8 cm LEFT ATRIUM             Index        RIGHT ATRIUM           Index LA diam:        3.50 cm 1.95 cm/m   RA Area:     12.90 cm LA Vol (A2C):   40.5 ml 22.56 ml/m  RA Volume:   29.10 ml  16.21 ml/m LA Vol (A4C):   31.8 ml 17.71 ml/m LA Biplane Vol: 38.0 ml 21.16 ml/m  AORTIC VALVE                   PULMONIC VALVE AV Area (Vmax): 2.53 cm      PV Vmax:        1.56 m/s AV Vmax:        175.00 cm/s   PV Peak grad:   9.7 mmHg AV Peak Grad:   12.2 mmHg     RVOT Peak grad: 8 mmHg LVOT Vmax:      156.00 cm/s LVOT Vmean:     105.000 cm/s LVOT VTI:       0.296 m  AORTA Ao Root diam: 3.10 cm Ao Asc diam:  2.70 cm MITRAL VALVE MV Area (PHT): 6.37 cm     SHUNTS MV Decel Time: 119 msec     Systemic VTI:  0.30 m MV E velocity: 127.00 cm/s  Systemic Diam: 1.90 cm MV A velocity:  123.00 cm/s MV E/A ratio:  1.03 Armanda Magic MD Electronically signed by Armanda Magic MD Signature Date/Time: 10/28/2022/5:41:31 PM    Final    CT ABDOMEN PELVIS WO CONTRAST  Result Date: 10/27/2022 CLINICAL DATA:  Abdominal pain EXAM: CT ABDOMEN AND PELVIS WITHOUT CONTRAST TECHNIQUE: Multidetector CT imaging of the abdomen and pelvis was performed following the standard protocol without IV contrast. RADIATION DOSE REDUCTION: This exam was performed according to the departmental dose-optimization program which includes automated exposure control, adjustment of the mA and/or kV according to patient size and/or use of iterative reconstruction technique. COMPARISON:  06/21/2022 FINDINGS: Lower chest: Unremarkable. Hepatobiliary: Liver measures 20.3 cm in length. There is fatty infiltration. There is interval decrease in severity of fatty infiltration in comparison with the previous study. There is mild nodularity of the liver surface. Surgical clips are seen in gallbladder fossa. There is no dilation of bile ducts. Pancreas: There is 2.5 cm low-density in head of the pancreas which was not seen in the previous examination. No focal abnormalities are seen in body and tail of pancreas. Spleen: Unremarkable. Adrenals/Urinary Tract: Adrenals are unremarkable. There is no hydronephrosis. There are few small calcific densities in both kidneys. Ureters are not dilated. Urinary bladder is not distended. Stomach/Bowel: Stomach is unremarkable. Small bowel  loops are not dilated. Appendix is not dilated. There is mild diffuse wall thickening in terminal ileum. There is marked wall thickening in cecum and ascending colon. There is mild-to-moderate wall thickening in transverse, descending and sigmoid colon. There is no loculated pericolic fluid collection. Vascular/Lymphatic: Vascular structures are unremarkable. There is stranding in the fat planes in the mesentery along with subcentimeter nodes in the mesentery. Reproductive: IUD is seen in uterus. Other: There is no ascites or pneumoperitoneum. Umbilical hernia containing fat is seen. Musculoskeletal: No acute findings are seen in bony structures. IMPRESSION: Enlarged fatty liver. There is nodularity of the liver surface suggesting possible cirrhosis. There is 2.5 cm low-density lesion in the head of the pancreas. This may suggest inflammatory or neoplastic process. Please correlate with laboratory findings and consider short-term follow-up CT and MRI as warranted. There is thickening in colon, especially in the right colon suggesting chronic inflammatory or infectious colitis. There is wall thickening in terminal ileum, possibly due to chronic inflammation. Appendix is not dilated. There is stranding in the mesenteric fat along with subcentimeter nodes in mesentery suggesting nonspecific inflammation. Stranding is particularly prominent in right lower quadrant of abdomen medial to the ileocecal junction. There is no loculated fluid collection in the mesentery. There is no evidence of intestinal obstruction or pneumoperitoneum. There is no hydronephrosis. There are a few small bilateral renal stones. Electronically Signed   By: Ernie Avena M.D.   On: 10/27/2022 19:22   DG Chest 2 View  Result Date: 10/27/2022 CLINICAL DATA:  Chest pain. EXAM: CHEST - 2 VIEW COMPARISON:  June 21, 2022. FINDINGS: The heart size and mediastinal contours are within normal limits. Both lungs are clear. The visualized skeletal  structures are unremarkable. IMPRESSION: No active cardiopulmonary disease. Electronically Signed   By: Lupita Raider M.D.   On: 10/27/2022 13:20   DG Hip Unilat With Pelvis 2-3 Views Left  Result Date: 10/20/2022 CLINICAL DATA:  Fall, left hip pain EXAM: DG HIP (WITH OR WITHOUT PELVIS) 2-3V LEFT COMPARISON:  None Available. FINDINGS: There is no evidence of hip fracture or dislocation. Acetabular lip spurring without significant joint space narrowing. IMPRESSION: No fracture or dislocation. Mild degenerative changes. Electronically Signed  By: Larose Hires D.O.   On: 10/20/2022 13:58   DG Lumbar Spine Complete  Result Date: 10/20/2022 CLINICAL DATA:  Fall twice about a week ago.  Lower back pain. EXAM: LUMBAR SPINE - COMPLETE 4+ VIEW COMPARISON:  Radiographs dated January 19, 2016 FINDINGS: There is no evidence of lumbar spine fracture. Alignment is normal. Intervertebral disc spaces are maintained. Chronic pars defects at L5 without significant anterolisthesis. Bilateral facet joint arthropathy at L5. IMPRESSION: 1. No acute fracture or malalignment. 2. Chronic pars defects at L5 without significant anterolisthesis. Electronically Signed   By: Larose Hires D.O.   On: 10/20/2022 13:55   DG Knee Complete 4 Views Left  Result Date: 10/20/2022 CLINICAL DATA:  Fall, pain. Patient states she fell twice about a week ago. EXAM: LEFT KNEE - COMPLETE 4+ VIEW COMPARISON:  None Available. FINDINGS: No evidence of fracture, dislocation, or joint effusion. Mild patellofemoral joint space narrowing with small osteophytes. Soft tissues are unremarkable. IMPRESSION: 1. No fracture or dislocation. 2. Mild patellofemoral osteoarthritis. Electronically Signed   By: Larose Hires D.O.   On: 10/20/2022 13:50      Subjective: Seen and examined on the day of discharge.  Stable no distress.  Appropriate for discharge home.  Discharge Exam: Vitals:   11/01/22 0415 11/01/22 0753  BP: (!) 134/93 (!) 134/96  Pulse:  (!) 102 (!) 102  Resp: 16 16  Temp: 98.1 F (36.7 C) 98.3 F (36.8 C)  SpO2: 99% 96%   Vitals:   10/31/22 1729 10/31/22 2014 11/01/22 0415 11/01/22 0753  BP:  138/88 (!) 134/93 (!) 134/96  Pulse:  92 (!) 102 (!) 102  Resp:  16 16 16   Temp:  99.3 F (37.4 C) 98.1 F (36.7 C) 98.3 F (36.8 C)  TempSrc:  Oral Oral Oral  SpO2:  99% 99% 96%  Weight: 96 kg     Height:        General: Pt is alert, awake, not in acute distress Cardiovascular: RRR, S1/S2 +, no rubs, no gallops Respiratory: CTA bilaterally, no wheezing, no rhonchi Abdominal: Soft, NT, ND, bowel sounds + Extremities: no edema, no cyanosis    The results of significant diagnostics from this hospitalization (including imaging, microbiology, ancillary and laboratory) are listed below for reference.     Microbiology: No results found for this or any previous visit (from the past 240 hour(s)).   Labs: BNP (last 3 results) No results for input(s): "BNP" in the last 8760 hours. Basic Metabolic Panel: Recent Labs  Lab 10/27/22 1704 10/27/22 1804 10/27/22 2210 10/28/22 0607 10/28/22 1335 10/29/22 0404 10/29/22 1505 10/30/22 0354 11/01/22 0318  NA  --  131*  --  135 133* 137  --   --  139  K <2.0* <2.0*   < > <2.0* 3.0* 2.5* 3.2* 4.0 3.3*  CL  --  76*  --  89* 91* 97*  --   --  103  CO2  --  35*  --  35* 29 30  --   --  29  GLUCOSE  --  138*  --  124* 144* 111*  --   --  102*  BUN  --  24*  --  19 16 12   --   --  7  CREATININE  --  1.31*  --  1.03* 1.16* 0.96  --   --  0.83  CALCIUM  --  8.6*  --  7.9* 7.9* 7.7*  --   --  8.3*  MG  1.4*  --   --  2.6*  --   --   --   --  1.3*  PHOS  --   --   --   --   --   --   --   --  2.9   < > = values in this interval not displayed.   Liver Function Tests: Recent Labs  Lab 10/27/22 1704 10/28/22 0607 10/28/22 1335 10/29/22 0404  AST 64* 58* 101* 77*  ALT 37 32 39 35  ALKPHOS 202* 166* 178* 170*  BILITOT 3.6* 2.8* 2.0* 1.1  PROT 6.8 6.1* 6.2* 5.6*  ALBUMIN  3.2* 2.7* 2.9* 2.7*   Recent Labs  Lab 10/28/22 0607  LIPASE 41   No results for input(s): "AMMONIA" in the last 168 hours. CBC: Recent Labs  Lab 10/27/22 1249 10/28/22 0607 10/29/22 0404  WBC 20.8* 9.2 8.2  NEUTROABS 17.4*  --   --   HGB 12.9 10.1* 8.5*  HCT 36.6 29.1* 24.3*  MCV 88.8 90.9 91.4  PLT 337 274 242   Cardiac Enzymes: No results for input(s): "CKTOTAL", "CKMB", "CKMBINDEX", "TROPONINI" in the last 168 hours. BNP: Invalid input(s): "POCBNP" CBG: No results for input(s): "GLUCAP" in the last 168 hours. D-Dimer No results for input(s): "DDIMER" in the last 72 hours. Hgb A1c No results for input(s): "HGBA1C" in the last 72 hours. Lipid Profile No results for input(s): "CHOL", "HDL", "LDLCALC", "TRIG", "CHOLHDL", "LDLDIRECT" in the last 72 hours. Thyroid function studies No results for input(s): "TSH", "T4TOTAL", "T3FREE", "THYROIDAB" in the last 72 hours.  Invalid input(s): "FREET3" Anemia work up Recent Labs    11/01/22 0318  VITAMINB12 781   Urinalysis    Component Value Date/Time   COLORURINE AMBER (A) 10/27/2022 1834   APPEARANCEUR HAZY (A) 10/27/2022 1834   APPEARANCEUR Clear 07/13/2022 0910   LABSPEC 1.013 10/27/2022 1834   PHURINE 6.0 10/27/2022 1834   GLUCOSEU NEGATIVE 10/27/2022 1834   HGBUR NEGATIVE 10/27/2022 1834   BILIRUBINUR NEGATIVE 10/27/2022 1834   BILIRUBINUR CANCELED 07/13/2022 0910   KETONESUR NEGATIVE 10/27/2022 1834   PROTEINUR 30 (A) 10/27/2022 1834   NITRITE NEGATIVE 10/27/2022 1834   LEUKOCYTESUR NEGATIVE 10/27/2022 1834   Sepsis Labs Recent Labs  Lab 10/27/22 1249 10/28/22 0607 10/29/22 0404  WBC 20.8* 9.2 8.2   Microbiology No results found for this or any previous visit (from the past 240 hour(s)).   Time coordinating discharge: Over 30 minutes  SIGNED:   Tresa Moore, MD  [facility] 11/01/2022, 3:07 PM Pager   If 7PM-7AM, please contact night-coverage

## 2022-11-09 DIAGNOSIS — G4733 Obstructive sleep apnea (adult) (pediatric): Secondary | ICD-10-CM | POA: Diagnosis not present

## 2022-11-28 ENCOUNTER — Ambulatory Visit: Payer: Medicaid Other | Admitting: Physician Assistant

## 2022-11-28 ENCOUNTER — Encounter: Payer: Self-pay | Admitting: Physician Assistant

## 2022-11-28 VITALS — BP 94/59 | HR 76 | Ht 63.0 in | Wt 189.6 lb

## 2022-11-28 DIAGNOSIS — E876 Hypokalemia: Secondary | ICD-10-CM

## 2022-11-28 DIAGNOSIS — M25562 Pain in left knee: Secondary | ICD-10-CM | POA: Diagnosis not present

## 2022-11-28 DIAGNOSIS — R1084 Generalized abdominal pain: Secondary | ICD-10-CM | POA: Diagnosis not present

## 2022-11-28 DIAGNOSIS — N179 Acute kidney failure, unspecified: Secondary | ICD-10-CM | POA: Diagnosis not present

## 2022-11-28 DIAGNOSIS — F1010 Alcohol abuse, uncomplicated: Secondary | ICD-10-CM

## 2022-11-28 MED ORDER — DICYCLOMINE HCL 10 MG PO CAPS
10.0000 mg | ORAL_CAPSULE | Freq: Two times a day (BID) | ORAL | 0 refills | Status: DC
Start: 2022-11-28 — End: 2023-02-12

## 2022-11-28 MED ORDER — KETOROLAC TROMETHAMINE 30 MG/ML IJ SOLN
30.0000 mg | Freq: Once | INTRAMUSCULAR | Status: AC
Start: 2022-11-28 — End: 2022-11-28
  Administered 2022-11-28: 30 mg via INTRAMUSCULAR

## 2022-11-28 NOTE — Patient Instructions (Addendum)
  I recommend the following at this time to help relieve that discomfort:   Rest Warm compresses to the area (20 minutes on, minimum of 30 minutes off) You can alternate Tylenol and Ibuprofen for pain management but Ibuprofen is typically preferred to reduce inflammation.  We have provided you with an injection of Toradol to help with the pain  For the next 7 days you can use Tylenol for the pain. After this you can alternate with Ibuprofen (make sure you take the Ibuprofen with food and drink plenty of water)  Gentle stretches and exercises that I have included in your paperwork Try to reduce excess strain to the area and rest as much as possible  Wear supportive shoes and, if you must lift anything, use proper lifting techniques that spare your back.   If these measures do not lead to improvement in your symptoms over the next 2-4 weeks please let us know

## 2022-11-28 NOTE — Assessment & Plan Note (Signed)
Recheck CMP today- results to dictate further management Reviewed drinking plenty of water and staying hydrated  Would prefer to reduce nephro-taxing medication use until stabilized

## 2022-11-28 NOTE — Assessment & Plan Note (Addendum)
Chronic, ongoing She is drinking about a pint per day at this time  She is interested in tapering further and reports her family is supportive and monitoring her at home  Recommend that she looks into rehab centers in the area to assist with withdrawal and cessation Follow up in about 4 weeks to discuss progress and goals

## 2022-11-28 NOTE — Progress Notes (Signed)
Acute Office Visit   Patient: Katie Stanley   DOB: Sep 14, 1985   37 y.o. Female  MRN: [id number] Visit Date: 11/28/2022  Today's healthcare provider: Oswaldo Conroy Errin Whitelaw, PA-C  Introduced myself to the patient as a Secondary school teacher and provided education on APPs in clinical practice.    Chief Complaint  Patient presents with   Knee Pain    Patient says she is having a hard time to walk. Patient says her L knee is bothering her more so than the R knee. Patient says when wake up, she notices a burning sensation and says that when the pain and discomfort is the worse. Patient says her legs and feet just hurt.    Medication Refill    Patient is requesting a refill on Dicyclomine prescription.    Subjective    HPI HPI     Knee Pain    Additional comments: Patient says she is having a hard time to walk. Patient says her L knee is bothering her more so than the R knee. Patient says when wake up, she notices a burning sensation and says that when the pain and discomfort is the worse. Patient says her legs and feet just hurt.         Medication Refill    Additional comments: Patient is requesting a refill on Dicyclomine prescription.       Last edited by Malen Gauze, CMA on 11/28/2022 10:23 AM.      Knee pain/ leg pain  She reports she has had a few falls  She states pain is worse in the AM when she first gets up then they burn from knee down to her toes She has been told she has arthritis in the past   Onset: gradual  Duration: started over a month ago  Location: bilateral lower legs and knees  Radiation: yes from knees to toes bilaterally . Reports left knee is in constant pain  Pain level and character: in the AM 8/10. 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    Hospital Follow up 10/27/22-11/01/22 Patient was hospitalized with alcoholic ketoacidosis, hypokalemia, UTI, AKI  She reports she has had some slips  She is now drinking about a pint per day and is interested in tapering further  Reviewed that Alcohol cessation will need to be supervised and she should investigate rehab services in the area  She has been taking Pepcid and Pantoprazole for stomach spasms- she reports she has not had Bentyl in a few days and her spasms have improved   She has been taking OTC potassium supplements  and magnesium supplements   Medications: Outpatient Medications Prior to Visit  Medication Sig   citalopram (CELEXA) 20 MG tablet TAKE 1 TABLET(20 MG) BY MOUTH DAILY   famotidine (PEPCID) 20 MG tablet Take 1 tablet (20 mg total) by mouth 2 (two) times daily.   lidocaine (LIDODERM) 5 % Place 1 patch onto the skin daily. Remove & Discard patch within 12 hours or as directed by MD   lisinopril (ZESTRIL) 10 MG tablet Take 1 tablet (10 mg total) by mouth daily.   Magnesium 250 MG TABS Take 1 tablet (250 mg total) by mouth 2 (two) times daily.  methocarbamol (ROBAXIN) 500 MG tablet Take 1 tablet (500 mg total) by mouth 2 (two) times daily.   metoprolol succinate (TOPROL-XL) 25 MG 24 hr tablet Take 1 tablet (25 mg total) by mouth daily.   pantoprazole (PROTONIX) 40 MG tablet Take 1 tablet (40 mg total) by mouth 2 (two) times daily before a meal.   potassium chloride SA (KLOR-CON M) 20 MEQ tablet Take 1 tablet (20 mEq total) by mouth daily.   sucralfate (CARAFATE) 1 GM/10ML suspension Take 10 mLs (1 g total) by mouth 4 (four) times daily -  with meals and at bedtime.   [DISCONTINUED] dicyclomine (BENTYL) 10 MG capsule Take 1 capsule (10 mg total) by mouth 3 (three) times daily as needed for spasms.   [DISCONTINUED] naproxen (NAPROSYN) 500 MG tablet Take 1 tablet (500 mg total) by mouth 2 (two) times daily with a meal. (Patient not taking: Reported  on 11/28/2022)   No facility-administered medications prior to visit.    Review of Systems  Respiratory:  Negative for shortness of breath.   Musculoskeletal:  Positive for arthralgias and myalgias. Negative for joint swelling.  Neurological:  Negative for dizziness.        Objective    BP (!) 94/59 (BP Location: Left Arm, Cuff Size: Normal)   Pulse 76   Ht 5\' 3"  (1.6 m)   Wt 189 lb 9.6 oz (86 kg)   SpO2 94%   BMI 33.59 kg/m     Physical Exam Vitals reviewed.  Constitutional:      General: She is awake.     Appearance: Normal appearance. She is well-developed and well-groomed.  HENT:     Head: Normocephalic and atraumatic.  Cardiovascular:     Rate and Rhythm: Normal rate and regular rhythm.     Heart sounds: Normal heart sounds. No murmur heard.    No friction rub. No gallop.  Pulmonary:     Effort: Pulmonary effort is normal.     Breath sounds: Normal breath sounds.  Musculoskeletal:     Cervical back: Normal range of motion.     Right knee: No swelling, deformity or effusion.     Left knee: Bony tenderness present. No swelling, deformity or effusion. Normal alignment, normal meniscus and normal patellar mobility.  Skin:    General: Skin is warm and dry.  Neurological:     General: No focal deficit present.     Mental Status: She is alert.     GCS: GCS eye subscore is 4. GCS verbal subscore is 5. GCS motor subscore is 6.     Deep Tendon Reflexes:     Reflex Scores:      Patellar reflexes are 1+ on the right side and 0 on the left side. Psychiatric:        Behavior: Behavior is cooperative.       No results found for any visits on 11/28/22.  Assessment & Plan      No follow-ups on file.      Problem List Items Addressed This Visit       Genitourinary   AKI (acute kidney injury) (HCC)    Recheck CMP today- results to dictate further management Reviewed drinking plenty of water and staying hydrated  Would prefer to reduce nephro-taxing medication  use until stabilized       Relevant Orders   CBC w/Diff   Comp Met (CMET)     Other   Alcohol abuse - Primary    Chronic, ongoing She is  drinking about a pint per day at this time  She is interested in tapering further and reports her family is supportive and monitoring her at home  Recommend that she looks into rehab centers in the area to assist with withdrawal and cessation Follow up in about 4 weeks to discuss progress and goals         Relevant Orders   Amylase   Lipase, blood   Magnesium   Vitamin B1   Hypokalemia    Recheck CMP today - results to dictate further management      Relevant Orders   CBC w/Diff   Comp Met (CMET)   Abdominal pain    Acute, seems to be resolving She reports sone stomach cramping and spasms which seem to be improving with Pepcid, Pantoprazole use Will provide short refill of Bentyl to further assist with resolution Recommend she continues to take Pepcid and Pantoprazole for now  Recommend she continues to wean from alcohol use  Follow up as needed for persistent or progressing symptoms        Relevant Medications   dicyclomine (BENTYL) 10 MG capsule   Other Visit Diagnoses     Acute pain of left knee     Acute, new concern PE is overall reassuring today  Most recent imaging reveals mild OA changes  Recommend warm compresses, tylenol, gentle stretches, massage for now  Will provide Toradol 30 mg injection and she can take NSAIDs in a week if kidney function allows Follow up as needed for persistent or progressing symptoms     Relevant Medications   ketorolac (TORADOL) 30 MG/ML injection 30 mg (Completed)        No follow-ups on file.   I, Suliman Termini E Tishawna Larouche, PA-C, have reviewed all documentation for this visit. The documentation on 12/01/22 for the exam, diagnosis, procedures, and orders are all accurate and complete.   Jacquelin Hawking, MHS, PA-C [facility]

## 2022-11-28 NOTE — Assessment & Plan Note (Signed)
Recheck CMP today - results to dictate further management

## 2022-12-01 NOTE — Assessment & Plan Note (Signed)
Acute, seems to be resolving She reports sone stomach cramping and spasms which seem to be improving with Pepcid, Pantoprazole use Will provide short refill of Bentyl to further assist with resolution Recommend she continues to take Pepcid and Pantoprazole for now  Recommend she continues to wean from alcohol use  Follow up as needed for persistent or progressing symptoms

## 2022-12-10 DIAGNOSIS — G4733 Obstructive sleep apnea (adult) (pediatric): Secondary | ICD-10-CM | POA: Diagnosis not present

## 2022-12-12 DIAGNOSIS — G4733 Obstructive sleep apnea (adult) (pediatric): Secondary | ICD-10-CM | POA: Diagnosis not present

## 2023-01-09 DIAGNOSIS — G4733 Obstructive sleep apnea (adult) (pediatric): Secondary | ICD-10-CM | POA: Diagnosis not present

## 2023-01-10 ENCOUNTER — Ambulatory Visit: Payer: Medicaid Other | Attending: Medical | Admitting: Medical

## 2023-01-10 NOTE — Progress Notes (Deleted)
Cardiology Office Note:    Date:  01/10/2023   ID:  Katie Stanley, DOB Apr 09, 1986, MRN [id number]  PCP:  Dorcas Carrow, DO  [facility] Cardiologist:  None  [facility] Electrophysiologist:  None   Referring MD: Dorcas Carrow, DO   Chief Complaint: Hospital follow-up  History of Present Illness:    Katie Stanley is a 37 y.o. female with a hx of alcohol abuse, palpitations, GERD, tobacco abuse substance use is being seen for hospital follow-up.  Echo in 2022 showed LVEF 50 to 55%.  Patient saw Dr. Mariah Milling 2021 for tachycardia and edema.  Holter monitor in 2021 showed normal sinus rhythm, PVCs and rare PACs.  Suspected lower leg edema with lymphedema or venous insufficiency.  She was given metoprolol for PVCs.  Patient was admitted in July 2024 with nausea, vomiting, and abdominal pain.  She was drinking 1/5 liquor daily.  No chest pain reported.  High-sensitivity troponin elevated to 83.  Echo showed normal LVEF with possible endocarditis versus bacteremia and cardiology was asked to see.  MD reviewed echo and felt that echo findings were nonspecific.  Patient was started on aspirin.  Plan was for outpatient workup.  Today,  Cardiac CTA  Past Medical History:  Diagnosis Date   Anxiety    Biliary calculi 12/07/2009   Cholelithiasis    Depression    GERD (gastroesophageal reflux disease)    Hepatitis C    body "self healed" per patient   Palpitations    Retained intrauterine contraceptive device (IUD) 01/20/2020   Substance abuse (HCC)     Past Surgical History:  Procedure Laterality Date   CHOLECYSTECTOMY     2011   COLONOSCOPY WITH PROPOFOL N/A 09/20/2022   Procedure: COLONOSCOPY WITH PROPOFOL;  Surgeon: Midge Minium, MD;  Location: [facility] ENDOSCOPY;  Service: Endoscopy;  Laterality: N/A;   ESOPHAGOGASTRODUODENOSCOPY N/A 09/29/2021   Procedure: ESOPHAGOGASTRODUODENOSCOPY (EGD);  Surgeon: Midge Minium, MD;  Location: [facility] ENDOSCOPY;  Service: Endoscopy;   Laterality: N/A;   ESOPHAGOGASTRODUODENOSCOPY (EGD) WITH PROPOFOL N/A 01/21/2018   Procedure: ESOPHAGOGASTRODUODENOSCOPY (EGD) WITH Biopsy;  Surgeon: Midge Minium, MD;  Location: [facility];  Service: Endoscopy;  Laterality: N/A;   HYSTEROSCOPY WITH D & C N/A 01/20/2020   Procedure: DILATATION AND CURETTAGE /HYSTEROSCOPY;  Surgeon: Conard Novak, MD;  Location: [facility] ORS;  Service: Gynecology;  Laterality: N/A;   INTRAUTERINE DEVICE (IUD) INSERTION N/A 01/20/2020   Procedure: INTRAUTERINE DEVICE (IUD) INSERTION MIRENA IUD;  Surgeon: Conard Novak, MD;  Location: [facility] ORS;  Service: Gynecology;  Laterality: N/A;   IUD REMOVAL N/A 01/20/2020   Procedure: INTRAUTERINE DEVICE (IUD) REMOVAL;  Surgeon: Conard Novak, MD;  Location: [facility] ORS;  Service: Gynecology;  Laterality: N/A;   PORT-A-CATH REMOVAL  02/08/2022   PORTA CATH INSERTION N/A 01/01/2019   Procedure: PORTA CATH INSERTION;  Surgeon: Annice Needy, MD;  Location: [facility] INVASIVE CV LAB;  Service: Cardiovascular;  Laterality: N/A;   VASCULAR SURGERY      Current Medications: No outpatient medications have been marked as taking for the 01/10/23 encounter (Appointment) with Fransico Michael, Bethel Gaglio H, PA-C.     Allergies:   Penicillins   Social History   Socioeconomic History   Marital status: Single    Spouse name: Not on file   Number of children: Not on file   Years of education: Not on file   Highest education level: Not on file  Occupational History   Not on file  Tobacco Use  Smoking status: Every Day    Current packs/day: 1.00    Average packs/day: 1 pack/day for 12.0 years (12.0 ttl pk-yrs)    Types: Cigarettes   Smokeless tobacco: Never  Vaping Use   Vaping status: Never Used  Substance and Sexual Activity   Alcohol use: Yes    Comment: 6 BEERS SOME DAY, 20 shots per day, had some beers6/21/2023   Drug use: Yes    Types: Marijuana    Comment: 09/28/2021   Sexual activity: Yes    Birth  control/protection: Implant, I.U.D.  Other Topics Concern   Not on file  Social History Narrative   Not on file   Social Determinants of Health   Financial Resource Strain: Low Risk  (04/06/2022)   [facility], [facility]   Overall Financial Resource Strain (CARDIA)    Difficulty of Paying Living Expenses: Not very hard  Food Insecurity: No Food Insecurity (10/30/2022)   Hunger Vital Sign    Worried About Running Out of Food in the Last Year: Never true    Ran Out of Food in the Last Year: Never true  Transportation Needs: No Transportation Needs (10/30/2022)   PRAPARE - Administrator, Civil Service (Medical): No    Lack of Transportation (Non-Medical): No  Physical Activity: Not on file  Stress: Not on file  Social Connections: Not on file     Family History: The patient's ***family history includes Breast cancer in her paternal grandmother; Diabetes in her father; Heart disease in her paternal grandfather; Hypertension in her father, maternal grandfather, and mother; Liver cancer in her maternal grandmother.  ROS:   Please see the history of present illness.    *** All other systems reviewed and are negative.  EKGs/Labs/Other Studies Reviewed:    The following studies were reviewed today: ***  EKG:  EKG is *** ordered today.  The ekg ordered today demonstrates ***  Recent Labs: 07/11/2022: TSH 1.833 10/29/2022: ALT 35; Hemoglobin 8.5; Platelets 242 11/01/2022: BUN 7; Creatinine, Ser 0.83; Magnesium 1.3; Potassium 3.3; Sodium 139  Recent Lipid Panel    Component Value Date/Time   CHOL 200 10/27/2022 1704   CHOL 160 01/06/2016 1017   TRIG 102 10/27/2022 1704   HDL 99 10/27/2022 1704   HDL 31 (L) 01/06/2016 1017   CHOLHDL 2.0 10/27/2022 1704   VLDL 20 10/27/2022 1704   LDLCALC 81 10/27/2022 1704   LDLCALC 60 01/06/2016 1017     Risk Assessment/Calculations:   {Does this patient have ATRIAL FIBRILLATION?:[phone]}   Physical  Exam:    VS:  There were no vitals taken for this visit.    Wt Readings from Last 3 Encounters:  11/28/22 189 lb 9.6 oz (86 kg)  10/31/22 211 lb 11.2 oz (96 kg)  10/20/22 188 lb 0.8 oz (85.3 kg)     GEN: *** Well nourished, well developed in no acute distress HEENT: Normal NECK: No JVD; No carotid bruits LYMPHATICS: No lymphadenopathy CARDIAC: ***RRR, no murmurs, rubs, gallops RESPIRATORY:  Clear to auscultation without rales, wheezing or rhonchi  ABDOMEN: Soft, non-tender, non-distended MUSCULOSKELETAL:  No edema; No deformity  SKIN: Warm and dry NEUROLOGIC:  Alert and oriented x 3 PSYCHIATRIC:  Normal affect   ASSESSMENT:    No diagnosis found. PLAN:    In order of problems listed above:  ***  Disposition: Follow up {follow up:15908} with ***   Shared Decision Making/Informed Consent   {Are you ordering a CV Procedure (e.g.  stress test, cath, DCCV, TEE, etc)?   Press F2        :[id number]}    Signed, Collene Massimino David Stall, PA-C  01/10/2023 7:50 AM    [facility]

## 2023-01-11 DIAGNOSIS — G4733 Obstructive sleep apnea (adult) (pediatric): Secondary | ICD-10-CM | POA: Diagnosis not present

## 2023-01-18 ENCOUNTER — Encounter: Payer: Medicaid Other | Admitting: Family Medicine

## 2023-02-05 ENCOUNTER — Encounter: Payer: Self-pay | Admitting: Family Medicine

## 2023-02-05 ENCOUNTER — Ambulatory Visit (INDEPENDENT_AMBULATORY_CARE_PROVIDER_SITE_OTHER): Payer: Medicaid Other | Admitting: Family Medicine

## 2023-02-05 VITALS — BP 104/70 | HR 97 | Ht 63.25 in | Wt 186.8 lb

## 2023-02-05 DIAGNOSIS — R350 Frequency of micturition: Secondary | ICD-10-CM

## 2023-02-05 DIAGNOSIS — E118 Type 2 diabetes mellitus with unspecified complications: Secondary | ICD-10-CM | POA: Insufficient documentation

## 2023-02-05 DIAGNOSIS — E559 Vitamin D deficiency, unspecified: Secondary | ICD-10-CM | POA: Diagnosis not present

## 2023-02-05 DIAGNOSIS — Z0000 Encounter for general adult medical examination without abnormal findings: Secondary | ICD-10-CM | POA: Diagnosis not present

## 2023-02-05 LAB — URINALYSIS, ROUTINE W REFLEX MICROSCOPIC
Bilirubin, UA: NEGATIVE
Glucose, UA: NEGATIVE
Ketones, UA: NEGATIVE
Leukocytes,UA: NEGATIVE
Nitrite, UA: NEGATIVE
RBC, UA: NEGATIVE
Specific Gravity, UA: 1.015 (ref 1.005–1.030)
Urobilinogen, Ur: 2 mg/dL — ABNORMAL HIGH (ref 0.2–1.0)
pH, UA: 5.5 (ref 5.0–7.5)

## 2023-02-05 LAB — MICROSCOPIC EXAMINATION
Bacteria, UA: NONE SEEN
RBC, Urine: NONE SEEN /[HPF] (ref 0–2)
WBC, UA: NONE SEEN /[HPF] (ref 0–5)

## 2023-02-05 LAB — BAYER DCA HB A1C WAIVED: HB A1C (BAYER DCA - WAIVED): 6.1 % — ABNORMAL HIGH (ref 4.8–5.6)

## 2023-02-05 LAB — MICROALBUMIN, URINE WAIVED
Creatinine, Urine Waived: 300 mg/dL (ref 10–300)
Microalb, Ur Waived: 80 mg/L — ABNORMAL HIGH (ref 0–19)

## 2023-02-05 NOTE — Patient Instructions (Signed)
Call to schedule your Heart doctor appointment:  [facility] [address] [place], [place] [address] [phone]

## 2023-02-05 NOTE — Assessment & Plan Note (Signed)
Will check labs. Await results. Treat as needed.  

## 2023-02-05 NOTE — Assessment & Plan Note (Signed)
Improved with A1c of 6.1. down from 6.6. Continue diet and exercise. Call with any concerns. Continue to monitor.

## 2023-02-05 NOTE — Progress Notes (Signed)
BP 104/70   Pulse 97   Ht 5' 3.25" (1.607 m)   Wt 186 lb 12.8 oz (84.7 kg)   SpO2 95%   BMI 32.83 kg/m    Subjective:    Patient ID: Katie Stanley, female    DOB: 12/02/1985, 37 y.o.   MRN: [id number]  HPI: Katie Stanley is a 37 y.o. female presenting on 02/05/2023 for comprehensive medical examination. Current medical complaints include:none  She currently lives with: boyfriend and son Menopausal Symptoms: no  Depression Screen done today and results listed below:     02/05/2023    2:45 PM 11/28/2022   10:26 AM 08/17/2022    3:46 PM 08/02/2022    9:47 AM 07/05/2022    3:02 PM  Depression screen PHQ 2/9  Decreased Interest 0 0 0 0 0  Down, Depressed, Hopeless 1 0 0 0 0  PHQ - 2 Score 1 0 0 0 0  Altered sleeping 0 1 0 1 1  Tired, decreased energy 2 0 0 1 1  Change in appetite 3 0 0 1 0  Feeling bad or failure about yourself  1 0 0 0 0  Trouble concentrating 0 0 0 0 0  Moving slowly or fidgety/restless 1 0 0 0 0  Suicidal thoughts 0 0 0 0 0  PHQ-9 Score 8 1 0 3 2  Difficult doing work/chores Very difficult Not difficult at all Not difficult at all Somewhat difficult Somewhat difficult    Past Medical History:  Past Medical History:  Diagnosis Date   Anxiety    Biliary calculi 12/07/2009   Cholelithiasis    Depression    GERD (gastroesophageal reflux disease)    Hepatitis C    body "self healed" per patient   Palpitations    Retained intrauterine contraceptive device (IUD) 01/20/2020   Substance abuse (HCC)     Surgical History:  Past Surgical History:  Procedure Laterality Date   CHOLECYSTECTOMY     2011   COLONOSCOPY WITH PROPOFOL N/A 09/20/2022   Procedure: COLONOSCOPY WITH PROPOFOL;  Surgeon: Midge Minium, MD;  Location: [facility] ENDOSCOPY;  Service: Endoscopy;  Laterality: N/A;   ESOPHAGOGASTRODUODENOSCOPY N/A 09/29/2021   Procedure: ESOPHAGOGASTRODUODENOSCOPY (EGD);  Surgeon: Midge Minium, MD;  Location: [facility] ENDOSCOPY;  Service: Endoscopy;  Laterality:  N/A;   ESOPHAGOGASTRODUODENOSCOPY (EGD) WITH PROPOFOL N/A 01/21/2018   Procedure: ESOPHAGOGASTRODUODENOSCOPY (EGD) WITH Biopsy;  Surgeon: Midge Minium, MD;  Location: [facility];  Service: Endoscopy;  Laterality: N/A;   HYSTEROSCOPY WITH D & C N/A 01/20/2020   Procedure: DILATATION AND CURETTAGE /HYSTEROSCOPY;  Surgeon: Conard Novak, MD;  Location: [facility] ORS;  Service: Gynecology;  Laterality: N/A;   INTRAUTERINE DEVICE (IUD) INSERTION N/A 01/20/2020   Procedure: INTRAUTERINE DEVICE (IUD) INSERTION MIRENA IUD;  Surgeon: Conard Novak, MD;  Location: [facility] ORS;  Service: Gynecology;  Laterality: N/A;   IUD REMOVAL N/A 01/20/2020   Procedure: INTRAUTERINE DEVICE (IUD) REMOVAL;  Surgeon: Conard Novak, MD;  Location: [facility] ORS;  Service: Gynecology;  Laterality: N/A;   PORT-A-CATH REMOVAL  02/08/2022   PORTA CATH INSERTION N/A 01/01/2019   Procedure: PORTA CATH INSERTION;  Surgeon: Annice Needy, MD;  Location: [facility] INVASIVE CV LAB;  Service: Cardiovascular;  Laterality: N/A;   VASCULAR SURGERY      Medications:  Current Outpatient Medications on File Prior to Visit  Medication Sig   citalopram (CELEXA) 20 MG tablet TAKE 1 TABLET(20 MG) BY MOUTH DAILY   lidocaine (LIDODERM) 5 % Place  1 patch onto the skin daily. Remove & Discard patch within 12 hours or as directed by MD   lisinopril (ZESTRIL) 10 MG tablet Take 1 tablet (10 mg total) by mouth daily.   Magnesium 250 MG TABS Take 1 tablet (250 mg total) by mouth 2 (two) times daily.   methocarbamol (ROBAXIN) 500 MG tablet Take 1 tablet (500 mg total) by mouth 2 (two) times daily.   metoprolol succinate (TOPROL-XL) 25 MG 24 hr tablet Take 1 tablet (25 mg total) by mouth daily.   potassium chloride SA (KLOR-CON M) 20 MEQ tablet Take 1 tablet (20 mEq total) by mouth daily.   sucralfate (CARAFATE) 1 GM/10ML suspension Take 10 mLs (1 g total) by mouth 4 (four) times daily -  with meals and at bedtime.   dicyclomine (BENTYL) 10 MG  capsule Take 1 capsule (10 mg total) by mouth 2 (two) times daily before lunch and supper. (Patient not taking: Reported on 02/05/2023)   famotidine (PEPCID) 20 MG tablet Take 1 tablet (20 mg total) by mouth 2 (two) times daily.   pantoprazole (PROTONIX) 40 MG tablet Take 1 tablet (40 mg total) by mouth 2 (two) times daily before a meal.   No current facility-administered medications on file prior to visit.    Allergies:  Allergies  Allergen Reactions   Penicillins Swelling    Social History:  Social History   Socioeconomic History   Marital status: Single    Spouse name: Not on file   Number of children: Not on file   Years of education: Not on file   Highest education level: Not on file  Occupational History   Not on file  Tobacco Use   Smoking status: Every Day    Current packs/day: 1.00    Average packs/day: 1 pack/day for 12.0 years (12.0 ttl pk-yrs)    Types: Cigarettes   Smokeless tobacco: Never  Vaping Use   Vaping status: Never Used  Substance and Sexual Activity   Alcohol use: Yes    Comment: 6 BEERS SOME DAY, 20 shots per day, had some beers6/21/2023   Drug use: Yes    Types: Marijuana    Comment: 09/28/2021   Sexual activity: Yes    Birth control/protection: Implant, I.U.D.  Other Topics Concern   Not on file  Social History Narrative   Not on file   Social Determinants of Health   Financial Resource Strain: Low Risk  (04/06/2022)   Received from [facility]   Overall Financial Resource Strain (CARDIA)    Difficulty of Paying Living Expenses: Not very hard  Food Insecurity: No Food Insecurity (10/30/2022)   Hunger Vital Sign    Worried About Running Out of Food in the Last Year: Never true    Ran Out of Food in the Last Year: Never true  Transportation Needs: No Transportation Needs (10/30/2022)   PRAPARE - Administrator, Civil Service (Medical): No    Lack of Transportation (Non-Medical): No  Physical Activity: Not  on file  Stress: Not on file  Social Connections: Not on file  Intimate Partner Violence: Not At Risk (10/30/2022)   Humiliation, Afraid, Rape, and Kick questionnaire    Fear of Current or Ex-Partner: No    Emotionally Abused: No    Physically Abused: No    Sexually Abused: No   Social History   Tobacco Use  Smoking Status Every Day   Current packs/day: 1.00   Average packs/day: 1 pack/day  for 12.0 years (12.0 ttl pk-yrs)   Types: Cigarettes  Smokeless Tobacco Never   Social History   Substance and Sexual Activity  Alcohol Use Yes   Comment: 6 BEERS SOME DAY, 20 shots per day, had some beers6/21/2023    Family History:  Family History  Problem Relation Age of Onset   Hypertension Mother    Diabetes Father    Hypertension Father    Liver cancer Maternal Grandmother    Hypertension Maternal Grandfather    Breast cancer Paternal Grandmother    Heart disease Paternal Grandfather     Past medical history, surgical history, medications, allergies, family history and social history reviewed with patient today and changes made to appropriate areas of the chart.   Review of Systems  Constitutional:  Positive for diaphoresis. Negative for chills, malaise/fatigue and weight loss.  HENT: Negative.    Eyes:  Positive for blurred vision and discharge. Negative for double vision, photophobia, pain and redness.  Respiratory:  Positive for cough. Negative for hemoptysis, sputum production, shortness of breath and wheezing.   Cardiovascular:  Positive for chest pain, palpitations and leg swelling. Negative for orthopnea, claudication and PND.  Gastrointestinal:  Positive for nausea and vomiting. Negative for abdominal pain, blood in stool, constipation, diarrhea, heartburn and melena.  Genitourinary:  Positive for dysuria and frequency. Negative for flank pain, hematuria and urgency.  Musculoskeletal: Negative.   Skin:  Positive for rash.  Neurological:  Positive for dizziness.  Negative for tingling, tremors, sensory change, speech change, focal weakness, seizures, loss of consciousness, weakness and headaches.  Endo/Heme/Allergies:  Positive for polydipsia. Negative for environmental allergies. Does not bruise/bleed easily.  Psychiatric/Behavioral: Negative.     All other ROS negative except what is listed above and in the HPI.      Objective:    BP 104/70   Pulse 97   Ht 5' 3.25" (1.607 m)   Wt 186 lb 12.8 oz (84.7 kg)   SpO2 95%   BMI 32.83 kg/m   Wt Readings from Last 3 Encounters:  02/05/23 186 lb 12.8 oz (84.7 kg)  11/28/22 189 lb 9.6 oz (86 kg)  10/31/22 211 lb 11.2 oz (96 kg)    Physical Exam Vitals and nursing note reviewed.  Constitutional:      General: She is not in acute distress.    Appearance: Normal appearance. She is not ill-appearing, toxic-appearing or diaphoretic.  HENT:     Head: Normocephalic and atraumatic.     Right Ear: Tympanic membrane, ear canal and external ear normal. There is no impacted cerumen.     Left Ear: Tympanic membrane, ear canal and external ear normal. There is no impacted cerumen.     Nose: Nose normal. No congestion or rhinorrhea.     Mouth/Throat:     Mouth: Mucous membranes are moist.     Pharynx: Oropharynx is clear. No oropharyngeal exudate or posterior oropharyngeal erythema.  Eyes:     General: No scleral icterus.       Right eye: No discharge.        Left eye: No discharge.     Extraocular Movements: Extraocular movements intact.     Conjunctiva/sclera: Conjunctivae normal.     Pupils: Pupils are equal, round, and reactive to light.  Neck:     Vascular: No carotid bruit.  Cardiovascular:     Rate and Rhythm: Normal rate and regular rhythm.     Pulses: Normal pulses.     Heart sounds: No murmur heard.  No friction rub. No gallop.  Pulmonary:     Effort: Pulmonary effort is normal. No respiratory distress.     Breath sounds: Normal breath sounds. No stridor. No wheezing, rhonchi or rales.   Chest:     Chest wall: No tenderness.  Abdominal:     General: Abdomen is flat. Bowel sounds are normal. There is no distension.     Palpations: Abdomen is soft. There is no mass.     Tenderness: There is no abdominal tenderness. There is no right CVA tenderness, left CVA tenderness, guarding or rebound.     Hernia: No hernia is present.  Genitourinary:    Comments: Breast and pelvic exams deferred with shared decision making Musculoskeletal:        General: No swelling, tenderness, deformity or signs of injury.     Cervical back: Normal range of motion and neck supple. No rigidity. No muscular tenderness.     Right lower leg: No edema.     Left lower leg: No edema.  Lymphadenopathy:     Cervical: No cervical adenopathy.  Skin:    General: Skin is warm and dry.     Capillary Refill: Capillary refill takes less than 2 seconds.     Coloration: Skin is not jaundiced or pale.     Findings: No bruising, erythema, lesion or rash.  Neurological:     General: No focal deficit present.     Mental Status: She is alert and oriented to person, place, and time. Mental status is at baseline.     Cranial Nerves: No cranial nerve deficit.     Sensory: No sensory deficit.     Motor: No weakness.     Coordination: Coordination normal.     Gait: Gait normal.     Deep Tendon Reflexes: Reflexes normal.  Psychiatric:        Mood and Affect: Mood normal. Affect is blunt.        Speech: Speech is slurred.        Behavior: Behavior is slowed.        Thought Content: Thought content normal.        Judgment: Judgment normal.     Results for orders placed or performed during the hospital encounter of 10/27/22  Basic metabolic panel  Result Value Ref Range   Sodium 132 (L) 135 - 145 mmol/L   Potassium <2.0 (LL) 3.5 - 5.1 mmol/L   Chloride 78 (L) 98 - 111 mmol/L   CO2 28 22 - 32 mmol/L   Glucose, Bld 142 (H) 70 - 99 mg/dL   BUN 25 (H) 6 - 20 mg/dL   Creatinine, Ser 4.09 (H) 0.44 - 1.00 mg/dL    Calcium 9.0 8.9 - 81.1 mg/dL   GFR, Estimated 55 (L) >60 mL/min   Anion gap 26 (H) 5 - 15  CBC with Differential  Result Value Ref Range   WBC 20.8 (H) 4.0 - 10.5 K/uL   RBC 4.12 3.87 - 5.11 MIL/uL   Hemoglobin 12.9 12.0 - 15.0 g/dL   HCT 91.4 78.2 - 95.6 %   MCV 88.8 80.0 - 100.0 fL   MCH 31.3 26.0 - 34.0 pg   MCHC 35.2 30.0 - 36.0 g/dL   RDW 21.3 08.6 - 57.8 %   Platelets 337 150 - 400 K/uL   nRBC 0.0 0.0 - 0.2 %   Neutrophils Relative % 84 %   Neutro Abs 17.4 (H) 1.7 - 7.7 K/uL   Lymphocytes Relative 9 %  Lymphs Abs 1.9 0.7 - 4.0 K/uL   Monocytes Relative 6 %   Monocytes Absolute 1.3 (H) 0.1 - 1.0 K/uL   Eosinophils Relative 0 %   Eosinophils Absolute 0.1 0.0 - 0.5 K/uL   Basophils Relative 0 %   Basophils Absolute 0.1 0.0 - 0.1 K/uL   WBC Morphology TOXIC GRANULATION    RBC Morphology MIXED RBC POPULATION    Smear Review PLATELET CLUMPS NOTED ON SMEAR    Immature Granulocytes 1 %   Abs Immature Granulocytes 0.13 (H) 0.00 - 0.07 K/uL  Magnesium  Result Value Ref Range   Magnesium 1.4 (L) 1.7 - 2.4 mg/dL  Hepatic function panel  Result Value Ref Range   Total Protein 6.8 6.5 - 8.1 g/dL   Albumin 3.2 (L) 3.5 - 5.0 g/dL   AST 64 (H) 15 - 41 U/L   ALT 37 0 - 44 U/L   Alkaline Phosphatase 202 (H) 38 - 126 U/L   Total Bilirubin 3.6 (H) 0.3 - 1.2 mg/dL   Bilirubin, Direct 1.4 (H) 0.0 - 0.2 mg/dL   Indirect Bilirubin 2.2 (H) 0.3 - 0.9 mg/dL  Urinalysis, Complete w Microscopic -Urine, Clean Catch  Result Value Ref Range   Color, Urine AMBER (A) YELLOW   APPearance HAZY (A) CLEAR   Specific Gravity, Urine 1.013 1.005 - 1.030   pH 6.0 5.0 - 8.0   Glucose, UA NEGATIVE NEGATIVE mg/dL   Hgb urine dipstick NEGATIVE NEGATIVE   Bilirubin Urine NEGATIVE NEGATIVE   Ketones, ur NEGATIVE NEGATIVE mg/dL   Protein, ur 30 (A) NEGATIVE mg/dL   Nitrite NEGATIVE NEGATIVE   Leukocytes,Ua NEGATIVE NEGATIVE   RBC / HPF 0-5 0 - 5 RBC/hpf   WBC, UA 0-5 0 - 5 WBC/hpf   Bacteria, UA RARE  (A) NONE SEEN   Squamous Epithelial / HPF 6-10 0 - 5 /HPF   Mucus PRESENT   Potassium  Result Value Ref Range   Potassium <2.0 (LL) 3.5 - 5.1 mmol/L  Potassium  Result Value Ref Range   Potassium <2.0 (LL) 3.5 - 5.1 mmol/L  Urine Drug Screen, Qualitative ([facility] only)  Result Value Ref Range   Tricyclic, Ur Screen POSITIVE (A) NONE DETECTED   Amphetamines, Ur Screen NONE DETECTED NONE DETECTED   MDMA (Ecstasy)Ur Screen NONE DETECTED NONE DETECTED   Cocaine Metabolite,Ur Amesti NONE DETECTED NONE DETECTED   Opiate, Ur Screen POSITIVE (A) NONE DETECTED   Phencyclidine (PCP) Ur S NONE DETECTED NONE DETECTED   Cannabinoid 50 Ng, Ur Pinson POSITIVE (A) NONE DETECTED   Barbiturates, Ur Screen NONE DETECTED NONE DETECTED   Benzodiazepine, Ur Scrn NONE DETECTED NONE DETECTED   Methadone Scn, Ur NONE DETECTED NONE DETECTED  Hemoglobin A1c  Result Value Ref Range   Hgb A1c MFr Bld 6.6 (H) 4.8 - 5.6 %   Mean Plasma Glucose 143 mg/dL  Lipid panel  Result Value Ref Range   Cholesterol 200 0 - 200 mg/dL   Triglycerides 578 <469 mg/dL   HDL 99 >62 mg/dL   Total CHOL/HDL Ratio 2.0 RATIO   VLDL 20 0 - 40 mg/dL   LDL Cholesterol 81 0 - 99 mg/dL  CBC  Result Value Ref Range   WBC 9.2 4.0 - 10.5 K/uL   RBC 3.20 (L) 3.87 - 5.11 MIL/uL   Hemoglobin 10.1 (L) 12.0 - 15.0 g/dL   HCT 95.2 (L) 84.1 - 32.4 %   MCV 90.9 80.0 - 100.0 fL   MCH 31.6 26.0 - 34.0 pg  MCHC 34.7 30.0 - 36.0 g/dL   RDW 78.2 95.6 - 21.3 %   Platelets 274 150 - 400 K/uL   nRBC 0.0 0.0 - 0.2 %  Comprehensive metabolic panel  Result Value Ref Range   Sodium 135 135 - 145 mmol/L   Potassium <2.0 (LL) 3.5 - 5.1 mmol/L   Chloride 89 (L) 98 - 111 mmol/L   CO2 35 (H) 22 - 32 mmol/L   Glucose, Bld 124 (H) 70 - 99 mg/dL   BUN 19 6 - 20 mg/dL   Creatinine, Ser 0.86 (H) 0.44 - 1.00 mg/dL   Calcium 7.9 (L) 8.9 - 10.3 mg/dL   Total Protein 6.1 (L) 6.5 - 8.1 g/dL   Albumin 2.7 (L) 3.5 - 5.0 g/dL   AST 58 (H) 15 - 41 U/L   ALT 32 0 - 44  U/L   Alkaline Phosphatase 166 (H) 38 - 126 U/L   Total Bilirubin 2.8 (H) 0.3 - 1.2 mg/dL   GFR, Estimated >57 >84 mL/min   Anion gap 11 5 - 15  Basic metabolic panel  Result Value Ref Range   Sodium 131 (L) 135 - 145 mmol/L   Potassium <2.0 (LL) 3.5 - 5.1 mmol/L   Chloride 76 (L) 98 - 111 mmol/L   CO2 35 (H) 22 - 32 mmol/L   Glucose, Bld 138 (H) 70 - 99 mg/dL   BUN 24 (H) 6 - 20 mg/dL   Creatinine, Ser 6.96 (H) 0.44 - 1.00 mg/dL   Calcium 8.6 (L) 8.9 - 10.3 mg/dL   GFR, Estimated 54 (L) >60 mL/min   Anion gap 20 (H) 5 - 15  Magnesium  Result Value Ref Range   Magnesium 2.6 (H) 1.7 - 2.4 mg/dL  Lipase, blood  Result Value Ref Range   Lipase 41 11 - 51 U/L  Lactate dehydrogenase  Result Value Ref Range   LDH 138 98 - 192 U/L  CA 19-9 (SERIAL)  Result Value Ref Range   CA 19-9 158 (H) 0 - 35 U/mL  Comprehensive metabolic panel  Result Value Ref Range   Sodium 133 (L) 135 - 145 mmol/L   Potassium 3.0 (L) 3.5 - 5.1 mmol/L   Chloride 91 (L) 98 - 111 mmol/L   CO2 29 22 - 32 mmol/L   Glucose, Bld 144 (H) 70 - 99 mg/dL   BUN 16 6 - 20 mg/dL   Creatinine, Ser 2.95 (H) 0.44 - 1.00 mg/dL   Calcium 7.9 (L) 8.9 - 10.3 mg/dL   Total Protein 6.2 (L) 6.5 - 8.1 g/dL   Albumin 2.9 (L) 3.5 - 5.0 g/dL   AST 284 (H) 15 - 41 U/L   ALT 39 0 - 44 U/L   Alkaline Phosphatase 178 (H) 38 - 126 U/L   Total Bilirubin 2.0 (H) 0.3 - 1.2 mg/dL   GFR, Estimated >13 >24 mL/min   Anion gap 13 5 - 15  Comprehensive metabolic panel  Result Value Ref Range   Sodium 137 135 - 145 mmol/L   Potassium 2.5 (LL) 3.5 - 5.1 mmol/L   Chloride 97 (L) 98 - 111 mmol/L   CO2 30 22 - 32 mmol/L   Glucose, Bld 111 (H) 70 - 99 mg/dL   BUN 12 6 - 20 mg/dL   Creatinine, Ser 4.01 0.44 - 1.00 mg/dL   Calcium 7.7 (L) 8.9 - 10.3 mg/dL   Total Protein 5.6 (L) 6.5 - 8.1 g/dL   Albumin 2.7 (L) 3.5 - 5.0 g/dL  AST 77 (H) 15 - 41 U/L   ALT 35 0 - 44 U/L   Alkaline Phosphatase 170 (H) 38 - 126 U/L   Total Bilirubin 1.1  0.3 - 1.2 mg/dL   GFR, Estimated >81 >19 mL/min   Anion gap 10 5 - 15  CBC  Result Value Ref Range   WBC 8.2 4.0 - 10.5 K/uL   RBC 2.66 (L) 3.87 - 5.11 MIL/uL   Hemoglobin 8.5 (L) 12.0 - 15.0 g/dL   HCT 14.7 (L) 82.9 - 56.2 %   MCV 91.4 80.0 - 100.0 fL   MCH 32.0 26.0 - 34.0 pg   MCHC 35.0 30.0 - 36.0 g/dL   RDW 13.0 86.5 - 78.4 %   Platelets 242 150 - 400 K/uL   nRBC 0.0 0.0 - 0.2 %  Potassium  Result Value Ref Range   Potassium 3.2 (L) 3.5 - 5.1 mmol/L  Blood gas, venous  Result Value Ref Range   pH, Ven 7.47 (H) 7.25 - 7.43   pCO2, Ven 49 44 - 60 mmHg   pO2, Ven 49 (H) 32 - 45 mmHg   Bicarbonate 35.7 (H) 20.0 - 28.0 mmol/L   Acid-Base Excess 10.4 (H) 0.0 - 2.0 mmol/L   O2 Saturation 78.4 %   Patient temperature 37.0    Collection site VEIN   Potassium  Result Value Ref Range   Potassium 4.0 3.5 - 5.1 mmol/L  Basic metabolic panel  Result Value Ref Range   Sodium 139 135 - 145 mmol/L   Potassium 3.3 (L) 3.5 - 5.1 mmol/L   Chloride 103 98 - 111 mmol/L   CO2 29 22 - 32 mmol/L   Glucose, Bld 102 (H) 70 - 99 mg/dL   BUN 7 6 - 20 mg/dL   Creatinine, Ser 6.96 0.44 - 1.00 mg/dL   Calcium 8.3 (L) 8.9 - 10.3 mg/dL   GFR, Estimated >29 >52 mL/min   Anion gap 7 5 - 15  Vitamin B12  Result Value Ref Range   Vitamin B-12 781 180 - 914 pg/mL  Magnesium  Result Value Ref Range   Magnesium 1.3 (L) 1.7 - 2.4 mg/dL  Phosphorus  Result Value Ref Range   Phosphorus 2.9 2.5 - 4.6 mg/dL  POC urine preg, ED  Result Value Ref Range   Preg Test, Ur Negative Negative  ECHOCARDIOGRAM COMPLETE  Result Value Ref Range   Weight 2,688 oz   Height 63 in   BP 120/80 mmHg   Ao pk vel 1.75 m/s   AR max vel 2.53 cm2   AV Peak grad 12.3 mmHg   S' Lateral 2.70 cm   Area-P 1/2 6.37 cm2   Est EF 60 - 65%   Troponin I (High Sensitivity)  Result Value Ref Range   Troponin I (High Sensitivity) 79 (H) <18 ng/L  Troponin I (High Sensitivity)  Result Value Ref Range   Troponin I (High  Sensitivity) 83 (H) <18 ng/L  Troponin I (High Sensitivity)  Result Value Ref Range   Troponin I (High Sensitivity) 75 (H) <18 ng/L      Assessment & Plan:   Problem List Items Addressed This Visit       Endocrine   Diabetes mellitus type 2 with complications (HCC)    Improved with A1c of 6.1. down from 6.6. Continue diet and exercise. Call with any concerns. Continue to monitor.       Relevant Orders   Urinalysis, Routine w reflex microscopic   Bayer  DCA Hb A1c Waived   Microalbumin, Urine Waived     Other   Vitamin D deficiency    Will check labs. Await results. Treat as needed.       Relevant Orders   VITAMIN D 25 Hydroxy (Vit-D Deficiency, Fractures)   Other Visit Diagnoses     Routine general medical examination at a health care facility    -  Primary   Vaccines up to date/declined. Screening labs to be drawn at the hospital ASAP. Pap up to date. Continue diet and exercise. Call with any concerns.   Relevant Orders   CBC with Differential/Platelet   Comprehensive metabolic panel   TSH   Lipid Profile   HCV RNA quant   Urinary frequency       Will check labs. Await results. Treat as needed.   Relevant Orders   Urinalysis, Routine w reflex microscopic        Follow up plan: Return asap follow up.   LABORATORY TESTING:  - Pap smear: up to date  IMMUNIZATIONS:   - Tdap: Tetanus vaccination status reviewed: last tetanus booster within 10 years. - Influenza: Refused - Pneumovax: Up to date - Prevnar: Not applicable - COVID: Refused - HPV: Not applicable - Shingrix vaccine: Not applicable   PATIENT COUNSELING:   Advised to take 1 mg of folate supplement per day if capable of pregnancy.   Sexuality: Discussed sexually transmitted diseases, partner selection, use of condoms, avoidance of unintended pregnancy  and contraceptive alternatives.   Advised to avoid cigarette smoking.  I discussed with the patient that most people either abstain from  alcohol or drink within safe limits (<=14/week and <=4 drinks/occasion for males, <=7/weeks and <= 3 drinks/occasion for females) and that the risk for alcohol disorders and other health effects rises proportionally with the number of drinks per week and how often a drinker exceeds daily limits.  Discussed cessation/primary prevention of drug use and availability of treatment for abuse.   Diet: Encouraged to adjust caloric intake to maintain  or achieve ideal body weight, to reduce intake of dietary saturated fat and total fat, to limit sodium intake by avoiding high sodium foods and not adding table salt, and to maintain adequate dietary potassium and calcium preferably from fresh fruits, vegetables, and low-fat dairy products.    stressed the importance of regular exercise  Injury prevention: Discussed safety belts, safety helmets, smoke detector, smoking near bedding or upholstery.   Dental health: Discussed importance of regular tooth brushing, flossing, and dental visits.    NEXT PREVENTATIVE PHYSICAL DUE IN 1 YEAR. Return asap follow up.

## 2023-02-06 ENCOUNTER — Other Ambulatory Visit
Admission: RE | Admit: 2023-02-06 | Discharge: 2023-02-06 | Disposition: A | Discharge status: Home / Self Care | Payer: Medicaid Other | Source: Ambulatory Visit | Attending: Family Medicine | Admitting: Family Medicine

## 2023-02-06 ENCOUNTER — Other Ambulatory Visit: Payer: Self-pay | Admitting: Family Medicine

## 2023-02-06 ENCOUNTER — Telehealth: Payer: Self-pay | Admitting: *Deleted

## 2023-02-06 DIAGNOSIS — E559 Vitamin D deficiency, unspecified: Secondary | ICD-10-CM | POA: Insufficient documentation

## 2023-02-06 DIAGNOSIS — Z0000 Encounter for general adult medical examination without abnormal findings: Secondary | ICD-10-CM | POA: Insufficient documentation

## 2023-02-06 DIAGNOSIS — E876 Hypokalemia: Secondary | ICD-10-CM

## 2023-02-06 LAB — COMPREHENSIVE METABOLIC PANEL
ALT: 33 U/L (ref 0–44)
AST: 101 U/L — ABNORMAL HIGH (ref 15–41)
Albumin: 2.9 g/dL — ABNORMAL LOW (ref 3.5–5.0)
Alkaline Phosphatase: 317 U/L — ABNORMAL HIGH (ref 38–126)
Anion gap: 20 — ABNORMAL HIGH (ref 5–15)
BUN: 23 mg/dL — ABNORMAL HIGH (ref 6–20)
CO2: 30 mmol/L (ref 22–32)
Calcium: 8.7 mg/dL — ABNORMAL LOW (ref 8.9–10.3)
Chloride: 75 mmol/L — ABNORMAL LOW (ref 98–111)
Creatinine, Ser: 1.72 mg/dL — ABNORMAL HIGH (ref 0.44–1.00)
GFR, Estimated: 39 mL/min — ABNORMAL LOW (ref >60)
Glucose, Bld: 121 mg/dL — ABNORMAL HIGH (ref 70–99)
Potassium: 2.3 mmol/L — CL (ref 3.5–5.1)
Sodium: 125 mmol/L — ABNORMAL LOW (ref 135–145)
Total Bilirubin: 5.8 mg/dL — ABNORMAL HIGH (ref 0.3–1.2)
Total Protein: 6.7 g/dL (ref 6.5–8.1)

## 2023-02-06 LAB — LIPID PANEL
Cholesterol: 144 mg/dL (ref 0–200)
HDL: 37 mg/dL — ABNORMAL LOW (ref >40)
LDL Cholesterol: 85 mg/dL (ref 0–99)
Total CHOL/HDL Ratio: 3.9 {ratio}
Triglycerides: 112 mg/dL (ref <150)
VLDL: 22 mg/dL (ref 0–40)

## 2023-02-06 LAB — CBC WITH DIFFERENTIAL/PLATELET
Abs Immature Granulocytes: 0.05 10*3/uL (ref 0.00–0.07)
Basophils Absolute: 0.1 10*3/uL (ref 0.0–0.1)
Basophils Relative: 1 %
Eosinophils Absolute: 0.1 10*3/uL (ref 0.0–0.5)
Eosinophils Relative: 1 %
HCT: 29.4 % — ABNORMAL LOW (ref 36.0–46.0)
Hemoglobin: 10.8 g/dL — ABNORMAL LOW (ref 12.0–15.0)
Immature Granulocytes: 1 %
Lymphocytes Relative: 15 %
Lymphs Abs: 1.4 10*3/uL (ref 0.7–4.0)
MCH: 33.1 pg (ref 26.0–34.0)
MCHC: 36.7 g/dL — ABNORMAL HIGH (ref 30.0–36.0)
MCV: 90.2 fL (ref 80.0–100.0)
Monocytes Absolute: 0.8 10*3/uL (ref 0.1–1.0)
Monocytes Relative: 8 %
Neutro Abs: 6.8 10*3/uL (ref 1.7–7.7)
Neutrophils Relative %: 74 %
Platelets: 185 10*3/uL (ref 150–400)
RBC: 3.26 MIL/uL — ABNORMAL LOW (ref 3.87–5.11)
RDW: 14.1 % (ref 11.5–15.5)
WBC: 9.1 10*3/uL (ref 4.0–10.5)
nRBC: 0 % (ref 0.0–0.2)

## 2023-02-06 LAB — TSH: TSH: 2.353 u[IU]/mL (ref 0.350–4.500)

## 2023-02-06 LAB — VITAMIN D 25 HYDROXY (VIT D DEFICIENCY, FRACTURES): Vit D, 25-Hydroxy: 47.29 ng/mL (ref 30–100)

## 2023-02-06 MED ORDER — POTASSIUM CHLORIDE CRYS ER 20 MEQ PO TBCR: 20.0000 meq | EXTENDED_RELEASE_TABLET | Freq: Every day | ORAL | 0 refills | Status: DC

## 2023-02-06 NOTE — Telephone Encounter (Signed)
Starting PO potassium and rechecking labs tomorrow.

## 2023-02-06 NOTE — Telephone Encounter (Signed)
[facility]  Critical K+ level 2.3  Call to [facility]- notified

## 2023-02-07 LAB — HCV RNA QUANT: HCV Quantitative: NOT DETECTED [IU]/mL (ref >50)

## 2023-02-07 NOTE — Telephone Encounter (Signed)
Request was refill was 02/06/23, duplicate request.  Requested Prescriptions  Pending Prescriptions Disp Refills   potassium chloride SA (KLOR-CON M) 20 MEQ tablet [Pharmacy Med Name: POTASSIUM CL ER TABLETS] 90 tablet     Sig: TAKE 1 TABLET(20 MEQ) BY MOUTH DAILY     Endocrinology:  Minerals - Potassium Supplementation Failed - 02/06/2023  5:04 PM      Failed - K in normal range and within 360 days    Potassium  Date Value Ref Range Status  02/06/2023 2.3 (LL) 3.5 - 5.1 mmol/L Final    Comment:    CRITICAL RESULT CALLED TO, READ BACK BY AND VERIFIED WITH JANE PAUL 02/06/23 1526 MU          Failed - Cr in normal range and within 360 days    Creatinine, Ser  Date Value Ref Range Status  02/06/2023 1.72 (H) 0.44 - 1.00 mg/dL Final         Passed - Valid encounter within last 12 months    Recent Outpatient Visits           2 days ago Routine general medical examination at a health care facility   [facility] Hytop, Megan P, DO   2 months ago Alcohol abuse   [facility] Mecum, Erin E, PA-C   5 months ago Gastroesophageal reflux disease, unspecified whether esophagitis present   [facility] Burns, Megan P, DO   6 months ago Epigastric pain   [facility] Megargel, Megan P, DO   7 months ago Chronic hepatitis C without hepatic coma [facility])   [facility] Marjie Skiff, NP       Future Appointments             In 2 days Dorcas Carrow, DO  [facility]   In 1 week Brion Aliment, Dois Davenport, NP [facility]

## 2023-02-08 ENCOUNTER — Other Ambulatory Visit
Admission: RE | Admit: 2023-02-08 | Discharge: 2023-02-08 | Disposition: A | Discharge status: Home / Self Care | Payer: Medicaid Other | Source: Ambulatory Visit | Attending: Family Medicine | Admitting: Family Medicine

## 2023-02-08 DIAGNOSIS — E876 Hypokalemia: Secondary | ICD-10-CM | POA: Insufficient documentation

## 2023-02-08 LAB — BASIC METABOLIC PANEL
Anion gap: 19 — ABNORMAL HIGH (ref 5–15)
BUN: 25 mg/dL — ABNORMAL HIGH (ref 6–20)
CO2: 30 mmol/L (ref 22–32)
Calcium: 8.6 mg/dL — ABNORMAL LOW (ref 8.9–10.3)
Chloride: 78 mmol/L — ABNORMAL LOW (ref 98–111)
Creatinine, Ser: 2.13 mg/dL — ABNORMAL HIGH (ref 0.44–1.00)
GFR, Estimated: 30 mL/min — ABNORMAL LOW (ref >60)
Glucose, Bld: 94 mg/dL (ref 70–99)
Potassium: 2.9 mmol/L — ABNORMAL LOW (ref 3.5–5.1)
Sodium: 127 mmol/L — ABNORMAL LOW (ref 135–145)

## 2023-02-09 ENCOUNTER — Inpatient Hospital Stay
Admission: EM | Admit: 2023-02-09 | Discharge: 2023-02-12 | DRG: 683 | Disposition: A | Discharge status: Home / Self Care | Payer: Medicaid Other | Attending: Internal Medicine | Admitting: Internal Medicine

## 2023-02-09 ENCOUNTER — Other Ambulatory Visit: Payer: Self-pay

## 2023-02-09 ENCOUNTER — Encounter: Payer: Self-pay | Admitting: Family Medicine

## 2023-02-09 ENCOUNTER — Encounter: Payer: Self-pay | Admitting: Emergency Medicine

## 2023-02-09 ENCOUNTER — Ambulatory Visit: Payer: Medicaid Other | Admitting: Family Medicine

## 2023-02-09 VITALS — BP 97/62 | HR 98 | Ht 63.25 in | Wt 185.4 lb

## 2023-02-09 DIAGNOSIS — K219 Gastro-esophageal reflux disease without esophagitis: Secondary | ICD-10-CM | POA: Diagnosis present

## 2023-02-09 DIAGNOSIS — R197 Diarrhea, unspecified: Secondary | ICD-10-CM | POA: Diagnosis present

## 2023-02-09 DIAGNOSIS — E861 Hypovolemia: Secondary | ICD-10-CM | POA: Diagnosis present

## 2023-02-09 DIAGNOSIS — K760 Fatty (change of) liver, not elsewhere classified: Secondary | ICD-10-CM | POA: Diagnosis present

## 2023-02-09 DIAGNOSIS — F1090 Alcohol use, unspecified, uncomplicated: Secondary | ICD-10-CM

## 2023-02-09 DIAGNOSIS — K7010 Alcoholic hepatitis without ascites: Secondary | ICD-10-CM | POA: Diagnosis present

## 2023-02-09 DIAGNOSIS — Z720 Tobacco use: Secondary | ICD-10-CM | POA: Diagnosis present

## 2023-02-09 DIAGNOSIS — R7989 Other specified abnormal findings of blood chemistry: Secondary | ICD-10-CM | POA: Diagnosis present

## 2023-02-09 DIAGNOSIS — D649 Anemia, unspecified: Secondary | ICD-10-CM | POA: Diagnosis present

## 2023-02-09 DIAGNOSIS — N179 Acute kidney failure, unspecified: Secondary | ICD-10-CM

## 2023-02-09 DIAGNOSIS — D638 Anemia in other chronic diseases classified elsewhere: Secondary | ICD-10-CM | POA: Diagnosis present

## 2023-02-09 DIAGNOSIS — B159 Hepatitis A without hepatic coma: Secondary | ICD-10-CM | POA: Diagnosis present

## 2023-02-09 DIAGNOSIS — I10 Essential (primary) hypertension: Secondary | ICD-10-CM | POA: Diagnosis present

## 2023-02-09 DIAGNOSIS — K863 Pseudocyst of pancreas: Secondary | ICD-10-CM | POA: Diagnosis present

## 2023-02-09 DIAGNOSIS — E860 Dehydration: Secondary | ICD-10-CM | POA: Diagnosis present

## 2023-02-09 DIAGNOSIS — F1910 Other psychoactive substance abuse, uncomplicated: Secondary | ICD-10-CM | POA: Diagnosis present

## 2023-02-09 DIAGNOSIS — E669 Obesity, unspecified: Secondary | ICD-10-CM | POA: Diagnosis present

## 2023-02-09 DIAGNOSIS — F418 Other specified anxiety disorders: Secondary | ICD-10-CM | POA: Diagnosis present

## 2023-02-09 DIAGNOSIS — E876 Hypokalemia: Secondary | ICD-10-CM | POA: Diagnosis not present

## 2023-02-09 DIAGNOSIS — G4733 Obstructive sleep apnea (adult) (pediatric): Secondary | ICD-10-CM | POA: Diagnosis not present

## 2023-02-09 DIAGNOSIS — K700 Alcoholic fatty liver: Secondary | ICD-10-CM | POA: Diagnosis present

## 2023-02-09 DIAGNOSIS — Z803 Family history of malignant neoplasm of breast: Secondary | ICD-10-CM

## 2023-02-09 DIAGNOSIS — Z79899 Other long term (current) drug therapy: Secondary | ICD-10-CM

## 2023-02-09 DIAGNOSIS — Z8249 Family history of ischemic heart disease and other diseases of the circulatory system: Secondary | ICD-10-CM

## 2023-02-09 DIAGNOSIS — I959 Hypotension, unspecified: Secondary | ICD-10-CM | POA: Diagnosis not present

## 2023-02-09 DIAGNOSIS — E878 Other disorders of electrolyte and fluid balance, not elsewhere classified: Secondary | ICD-10-CM | POA: Diagnosis present

## 2023-02-09 DIAGNOSIS — R000 Tachycardia, unspecified: Secondary | ICD-10-CM | POA: Diagnosis not present

## 2023-02-09 DIAGNOSIS — E872 Acidosis, unspecified: Secondary | ICD-10-CM | POA: Diagnosis present

## 2023-02-09 DIAGNOSIS — E119 Type 2 diabetes mellitus without complications: Secondary | ICD-10-CM | POA: Diagnosis present

## 2023-02-09 DIAGNOSIS — Z9049 Acquired absence of other specified parts of digestive tract: Secondary | ICD-10-CM

## 2023-02-09 DIAGNOSIS — E871 Hypo-osmolality and hyponatremia: Secondary | ICD-10-CM | POA: Diagnosis present

## 2023-02-09 DIAGNOSIS — F17210 Nicotine dependence, cigarettes, uncomplicated: Secondary | ICD-10-CM | POA: Diagnosis present

## 2023-02-09 DIAGNOSIS — F1010 Alcohol abuse, uncomplicated: Secondary | ICD-10-CM | POA: Diagnosis present

## 2023-02-09 DIAGNOSIS — Z6834 Body mass index (BMI) 34.0-34.9, adult: Secondary | ICD-10-CM

## 2023-02-09 DIAGNOSIS — Z833 Family history of diabetes mellitus: Secondary | ICD-10-CM

## 2023-02-09 DIAGNOSIS — F419 Anxiety disorder, unspecified: Secondary | ICD-10-CM | POA: Diagnosis present

## 2023-02-09 DIAGNOSIS — F32A Depression, unspecified: Secondary | ICD-10-CM | POA: Diagnosis present

## 2023-02-09 DIAGNOSIS — Z800 Family history of malignant neoplasm of digestive organs: Secondary | ICD-10-CM

## 2023-02-09 DIAGNOSIS — Z880 Allergy status to penicillin: Secondary | ICD-10-CM

## 2023-02-09 DIAGNOSIS — R945 Abnormal results of liver function studies: Secondary | ICD-10-CM | POA: Diagnosis present

## 2023-02-09 LAB — URINE DRUG SCREEN, QUALITATIVE (ARMC ONLY)
Amphetamines, Ur Screen: NOT DETECTED
Barbiturates, Ur Screen: NOT DETECTED
Benzodiazepine, Ur Scrn: NOT DETECTED
Cannabinoid 50 Ng, Ur ~~LOC~~: POSITIVE — AB
Cocaine Metabolite,Ur ~~LOC~~: NOT DETECTED
MDMA (Ecstasy)Ur Screen: NOT DETECTED
Methadone Scn, Ur: NOT DETECTED
Opiate, Ur Screen: POSITIVE — AB
Phencyclidine (PCP) Ur S: NOT DETECTED
Tricyclic, Ur Screen: NOT DETECTED

## 2023-02-09 LAB — BASIC METABOLIC PANEL
Anion gap: 19 — ABNORMAL HIGH (ref 5–15)
BUN: 22 mg/dL — ABNORMAL HIGH (ref 6–20)
CO2: 29 mmol/L (ref 22–32)
Calcium: 8.9 mg/dL (ref 8.9–10.3)
Chloride: 81 mmol/L — ABNORMAL LOW (ref 98–111)
Creatinine, Ser: 1.55 mg/dL — ABNORMAL HIGH (ref 0.44–1.00)
GFR, Estimated: 44 mL/min — ABNORMAL LOW (ref >60)
Glucose, Bld: 129 mg/dL — ABNORMAL HIGH (ref 70–99)
Potassium: 2.7 mmol/L — CL (ref 3.5–5.1)
Sodium: 129 mmol/L — ABNORMAL LOW (ref 135–145)

## 2023-02-09 LAB — CBC
HCT: 27.5 % — ABNORMAL LOW (ref 36.0–46.0)
Hemoglobin: 9.9 g/dL — ABNORMAL LOW (ref 12.0–15.0)
MCH: 32.9 pg (ref 26.0–34.0)
MCHC: 36 g/dL (ref 30.0–36.0)
MCV: 91.4 fL (ref 80.0–100.0)
Platelets: 236 10*3/uL (ref 150–400)
RBC: 3.01 MIL/uL — ABNORMAL LOW (ref 3.87–5.11)
RDW: 14.6 % (ref 11.5–15.5)
WBC: 9.3 10*3/uL (ref 4.0–10.5)
nRBC: 0 % (ref 0.0–0.2)

## 2023-02-09 LAB — HEPATIC FUNCTION PANEL
ALT: 37 U/L (ref 0–44)
AST: 118 U/L — ABNORMAL HIGH (ref 15–41)
Albumin: 3 g/dL — ABNORMAL LOW (ref 3.5–5.0)
Alkaline Phosphatase: 321 U/L — ABNORMAL HIGH (ref 38–126)
Bilirubin, Direct: 1.9 mg/dL — ABNORMAL HIGH (ref 0.0–0.2)
Indirect Bilirubin: 2.1 mg/dL — ABNORMAL HIGH (ref 0.3–0.9)
Total Bilirubin: 4 mg/dL — ABNORMAL HIGH (ref 0.3–1.2)
Total Protein: 7 g/dL (ref 6.5–8.1)

## 2023-02-09 LAB — URINALYSIS, ROUTINE W REFLEX MICROSCOPIC
Bilirubin Urine: NEGATIVE
Glucose, UA: NEGATIVE mg/dL
Hgb urine dipstick: NEGATIVE
Ketones, ur: NEGATIVE mg/dL
Leukocytes,Ua: NEGATIVE
Nitrite: NEGATIVE
Protein, ur: NEGATIVE mg/dL
Specific Gravity, Urine: 1.015 (ref 1.005–1.030)
pH: 5 (ref 5.0–8.0)

## 2023-02-09 LAB — PHOSPHORUS: Phosphorus: 1.9 mg/dL — ABNORMAL LOW (ref 2.5–4.6)

## 2023-02-09 LAB — TROPONIN I (HIGH SENSITIVITY): Troponin I (High Sensitivity): 8 ng/L (ref <18)

## 2023-02-09 LAB — MAGNESIUM: Magnesium: 1.6 mg/dL — ABNORMAL LOW (ref 1.7–2.4)

## 2023-02-09 LAB — LIPASE, BLOOD: Lipase: 55 U/L — ABNORMAL HIGH (ref 11–51)

## 2023-02-09 LAB — ETHANOL: Alcohol, Ethyl (B): <10 mg/dL (ref <10)

## 2023-02-09 MED ORDER — CHLORDIAZEPOXIDE HCL 25 MG PO CAPS
25.0000 mg | ORAL_CAPSULE | Freq: Once | ORAL | Status: AC
  Administered 2023-02-10: 25 mg via ORAL
  Filled 2023-02-09: qty 1

## 2023-02-09 MED ORDER — ONDANSETRON HCL 4 MG/2ML IJ SOLN
4.0000 mg | Freq: Once | INTRAMUSCULAR | Status: AC
  Administered 2023-02-09: 4 mg via INTRAVENOUS
  Filled 2023-02-09: qty 2

## 2023-02-09 MED ORDER — LORAZEPAM 1 MG PO TABS: 0.0000 mg | ORAL_TABLET | Freq: Two times a day (BID) | ORAL | Status: DC

## 2023-02-09 MED ORDER — ACETAMINOPHEN 325 MG PO TABS: 325.0000 mg | ORAL_TABLET | Freq: Four times a day (QID) | ORAL | Status: DC | PRN

## 2023-02-09 MED ORDER — PANTOPRAZOLE SODIUM 40 MG PO TBEC
40.0000 mg | DELAYED_RELEASE_TABLET | Freq: Two times a day (BID) | ORAL | Status: DC
  Administered 2023-02-10 – 2023-02-12 (×5): 40 mg via ORAL
  Filled 2023-02-09 (×5): qty 1

## 2023-02-09 MED ORDER — SODIUM CHLORIDE 0.9 % IV BOLUS
1000.0000 mL | Freq: Once | INTRAVENOUS | Status: AC
  Administered 2023-02-10: 1000 mL via INTRAVENOUS

## 2023-02-09 MED ORDER — FOLIC ACID 1 MG PO TABS
1.0000 mg | ORAL_TABLET | Freq: Every day | ORAL | Status: DC
  Administered 2023-02-09 – 2023-02-12 (×4): 1 mg via ORAL
  Filled 2023-02-09 (×4): qty 1

## 2023-02-09 MED ORDER — SUCRALFATE 1 GM/10ML PO SUSP
1.0000 g | Freq: Three times a day (TID) | ORAL | Status: DC
  Administered 2023-02-10 – 2023-02-12 (×9): 1 g via ORAL
  Filled 2023-02-09 (×11): qty 10

## 2023-02-09 MED ORDER — METHOCARBAMOL 500 MG PO TABS: 500.0000 mg | ORAL_TABLET | Freq: Three times a day (TID) | ORAL | Status: DC | PRN

## 2023-02-09 MED ORDER — NICOTINE 21 MG/24HR TD PT24
21.0000 mg | MEDICATED_PATCH | Freq: Every day | TRANSDERMAL | Status: DC
  Filled 2023-02-09 (×4): qty 1

## 2023-02-09 MED ORDER — K PHOS MONO-SOD PHOS DI & MONO 155-852-130 MG PO TABS
500.0000 mg | ORAL_TABLET | ORAL | Status: AC
  Administered 2023-02-10 (×2): 500 mg via ORAL
  Filled 2023-02-09 (×4): qty 2

## 2023-02-09 MED ORDER — MAGNESIUM SULFATE 2 GM/50ML IV SOLN
2.0000 g | Freq: Once | INTRAVENOUS | Status: AC
  Administered 2023-02-10: 2 g via INTRAVENOUS
  Filled 2023-02-09: qty 50

## 2023-02-09 MED ORDER — HYDRALAZINE HCL 20 MG/ML IJ SOLN: 5.0000 mg | INTRAMUSCULAR | Status: DC | PRN

## 2023-02-09 MED ORDER — MAGNESIUM SULFATE 50 % IJ SOLN: 3.0000 g | Freq: Once | INTRAVENOUS | Status: DC

## 2023-02-09 MED ORDER — POTASSIUM CHLORIDE 10 MEQ/100ML IV SOLN
10.0000 meq | Freq: Once | INTRAVENOUS | Status: AC
  Administered 2023-02-09: 10 meq via INTRAVENOUS
  Filled 2023-02-09: qty 100

## 2023-02-09 MED ORDER — ADULT MULTIVITAMIN W/MINERALS CH
1.0000 | ORAL_TABLET | Freq: Every day | ORAL | Status: DC
  Administered 2023-02-09 – 2023-02-12 (×4): 1 via ORAL
  Filled 2023-02-09 (×4): qty 1

## 2023-02-09 MED ORDER — LORAZEPAM 2 MG PO TABS: 2.0000 mg | ORAL_TABLET | ORAL | Status: DC | PRN

## 2023-02-09 MED ORDER — THIAMINE HCL 100 MG/ML IJ SOLN: 100.0000 mg | Freq: Every day | INTRAMUSCULAR | Status: DC

## 2023-02-09 MED ORDER — LORAZEPAM 1 MG PO TABS
1.0000 mg | ORAL_TABLET | ORAL | Status: DC | PRN
  Administered 2023-02-10: 1 mg via ORAL
  Administered 2023-02-10: 2 mg via ORAL
  Administered 2023-02-10 – 2023-02-12 (×3): 1 mg via ORAL
  Filled 2023-02-09 (×2): qty 2
  Filled 2023-02-09 (×3): qty 1

## 2023-02-09 MED ORDER — METOPROLOL SUCCINATE ER 50 MG PO TB24: 25.0000 mg | ORAL_TABLET | Freq: Every day | ORAL | Status: DC

## 2023-02-09 MED ORDER — THIAMINE MONONITRATE 100 MG PO TABS
100.0000 mg | ORAL_TABLET | Freq: Every day | ORAL | Status: DC
  Administered 2023-02-09 – 2023-02-12 (×4): 100 mg via ORAL
  Filled 2023-02-09 (×4): qty 1

## 2023-02-09 MED ORDER — SODIUM CHLORIDE 1 G PO TABS
1.0000 g | ORAL_TABLET | Freq: Two times a day (BID) | ORAL | Status: DC
  Administered 2023-02-10: 1 g via ORAL
  Filled 2023-02-09: qty 1

## 2023-02-09 MED ORDER — LORAZEPAM 1 MG PO TABS
0.0000 mg | ORAL_TABLET | Freq: Four times a day (QID) | ORAL | Status: AC
  Administered 2023-02-09 – 2023-02-11 (×3): 2 mg via ORAL
  Administered 2023-02-11: 1 mg via ORAL
  Filled 2023-02-09: qty 2
  Filled 2023-02-09: qty 1
  Filled 2023-02-09 (×2): qty 2
  Filled 2023-02-09: qty 1
  Filled 2023-02-09: qty 2

## 2023-02-09 MED ORDER — SODIUM CHLORIDE 0.9 % IV BOLUS
1000.0000 mL | Freq: Once | INTRAVENOUS | Status: AC
  Administered 2023-02-09: 1000 mL via INTRAVENOUS

## 2023-02-09 MED ORDER — MAGNESIUM OXIDE -MG SUPPLEMENT 400 (240 MG) MG PO TABS
400.0000 mg | ORAL_TABLET | Freq: Two times a day (BID) | ORAL | Status: DC
  Administered 2023-02-10 – 2023-02-12 (×5): 400 mg via ORAL
  Filled 2023-02-09 (×5): qty 1

## 2023-02-09 MED ORDER — CITALOPRAM HYDROBROMIDE 10 MG PO TABS
20.0000 mg | ORAL_TABLET | Freq: Every day | ORAL | Status: DC
  Administered 2023-02-10 – 2023-02-12 (×3): 20 mg via ORAL
  Filled 2023-02-09: qty 2
  Filled 2023-02-09: qty 1
  Filled 2023-02-09: qty 2

## 2023-02-09 MED ORDER — MAGNESIUM SULFATE IN D5W 1-5 GM/100ML-% IV SOLN
1.0000 g | Freq: Once | INTRAVENOUS | Status: AC
  Administered 2023-02-10: 1 g via INTRAVENOUS
  Filled 2023-02-09 (×2): qty 100

## 2023-02-09 MED ORDER — ONDANSETRON HCL 4 MG/2ML IJ SOLN
4.0000 mg | Freq: Three times a day (TID) | INTRAMUSCULAR | Status: DC | PRN
  Administered 2023-02-10: 4 mg via INTRAVENOUS
  Filled 2023-02-09: qty 2

## 2023-02-09 MED ORDER — CHLORDIAZEPOXIDE HCL 5 MG PO CAPS: 10.0000 mg | ORAL_CAPSULE | Freq: Three times a day (TID) | ORAL | Status: DC

## 2023-02-09 MED ORDER — POTASSIUM CHLORIDE CRYS ER 20 MEQ PO TBCR
40.0000 meq | EXTENDED_RELEASE_TABLET | ORAL | Status: AC
  Administered 2023-02-10 (×2): 40 meq via ORAL
  Filled 2023-02-09 (×2): qty 2

## 2023-02-09 NOTE — ED Notes (Signed)
IV team at bedside 

## 2023-02-09 NOTE — Assessment & Plan Note (Signed)
Slightly improved on potassium supplement, but still low- in AKI- will send to ER.

## 2023-02-09 NOTE — Progress Notes (Signed)
BP 97/62   Pulse 98   Ht 5' 3.25" (1.607 m)   Wt 185 lb 6.4 oz (84.1 kg)   SpO2 96%   BMI 32.58 kg/m    Subjective:    Patient ID: Katie Stanley, female    DOB: [birth year], 37 y.o.   MRN: [id number]  HPI: Katie Stanley is a 37 y.o. female  Chief Complaint  Patient presents with   Foot Pain    Patient says her feet are constantly aching and says it is all the time. Patient says it does not hurt when she is walking, but when she sits, is when she notices the most pain and discomfort.   Hypertension    Patient says she has not taken her Blood Pressure medication in the past 2-3 days. Patient says she stopped taking medication, as she noticed her blood pressure readings were low.    Katie Stanley is not feeling well today. She has been taking her potassium pill, but she is having bad cramps in her legs and feet still. She has not taken her BP medicine because her blood pressure has been running low and she has been dizzy. She has been eating 1 meal a day and has been drinking less than a 1/5 of alcohol- this is down for her, but she is not feeling well at all and she is scared.   Relevant past medical, surgical, family and social history reviewed and updated as indicated. Interim medical history since our last visit reviewed. Allergies and medications reviewed and updated.  Review of Systems  Constitutional:  Positive for fatigue. Negative for activity change, appetite change, chills, diaphoresis, fever and unexpected weight change.  Respiratory: Negative.    Cardiovascular: Negative.   Gastrointestinal: Negative.   Musculoskeletal:  Positive for myalgias. Negative for arthralgias, back pain, gait problem, joint swelling, neck pain and neck stiffness.  Skin: Negative.   Neurological:  Positive for dizziness, light-headedness and numbness. Negative for tremors, seizures, syncope, facial asymmetry, speech difficulty, weakness and headaches.  Hematological: Negative.    Psychiatric/Behavioral: Negative.      Per HPI unless specifically indicated above     Objective:    BP 97/62   Pulse 98   Ht 5' 3.25" (1.607 m)   Wt 185 lb 6.4 oz (84.1 kg)   SpO2 96%   BMI 32.58 kg/m   Wt Readings from Last 3 Encounters:  02/09/23 185 lb 6.4 oz (84.1 kg)  02/05/23 186 lb 12.8 oz (84.7 kg)  11/28/22 189 lb 9.6 oz (86 kg)    Physical Exam Vitals and nursing note reviewed.  Constitutional:      General: She is not in acute distress.    Appearance: Normal appearance. She is obese. She is not ill-appearing, toxic-appearing or diaphoretic.  HENT:     Head: Normocephalic and atraumatic.     Right Ear: External ear normal.     Left Ear: External ear normal.     Nose: Nose normal.     Mouth/Throat:     Mouth: Mucous membranes are moist.     Pharynx: Oropharynx is clear.  Eyes:     General: No scleral icterus.       Right eye: No discharge.        Left eye: No discharge.     Extraocular Movements: Extraocular movements intact.     Conjunctiva/sclera: Conjunctivae normal.     Pupils: Pupils are equal, round, and reactive to light.  Cardiovascular:     Rate  and Rhythm: Normal rate and regular rhythm.     Pulses: Normal pulses.     Heart sounds: Normal heart sounds. No murmur heard.    No friction rub. No gallop.  Pulmonary:     Effort: Pulmonary effort is normal. No respiratory distress.     Breath sounds: Normal breath sounds. No stridor. No wheezing, rhonchi or rales.  Chest:     Chest wall: No tenderness.  Musculoskeletal:        General: Normal range of motion.     Cervical back: Normal range of motion and neck supple.  Skin:    General: Skin is warm and dry.     Capillary Refill: Capillary refill takes less than 2 seconds.     Coloration: Skin is not jaundiced or pale.     Findings: No bruising, erythema, lesion or rash.  Neurological:     General: No focal deficit present.     Mental Status: She is alert and oriented to person, place, and  time. Mental status is at baseline.  Psychiatric:        Mood and Affect: Mood normal. Affect is tearful.        Behavior: Behavior normal.        Thought Content: Thought content normal.        Judgment: Judgment normal.     Results for orders placed or performed during the hospital encounter of 02/08/23  Basic metabolic panel  Result Value Ref Range   Sodium 127 (L) 135 - 145 mmol/L   Potassium 2.9 (L) 3.5 - 5.1 mmol/L   Chloride 78 (L) 98 - 111 mmol/L   CO2 30 22 - 32 mmol/L   Glucose, Bld 94 70 - 99 mg/dL   BUN 25 (H) 6 - 20 mg/dL   Creatinine, Ser 9.52 (H) 0.44 - 1.00 mg/dL   Calcium 8.6 (L) 8.9 - 10.3 mg/dL   GFR, Estimated 30 (L) >60 mL/min   Anion gap 19 (H) 5 - 15      Assessment & Plan:   Problem List Items Addressed This Visit       Cardiovascular and Mediastinum   Primary hypertension    Has not taken any of her blood pressure medicine in about 2-3 days. Hypotensive. In AKI. Will send to ER.         Other   Hypokalemia    Slightly improved on potassium supplement, but still low- in AKI- will send to ER.       Other Visit Diagnoses     AKI (acute kidney injury) (HCC)    -  Primary   Kidney function worsening. Will get her to ER. Call to Triage nurse made. Follow up after ER visit.   Hypotension, unspecified hypotension type       ? due to hypovolemia. Given AKI will send to ER for fluids.        Follow up plan: Return 7-10 days.

## 2023-02-09 NOTE — Assessment & Plan Note (Signed)
Has not taken any of her blood pressure medicine in about 2-3 days. Hypotensive. In AKI. Will send to ER.

## 2023-02-09 NOTE — ED Provider Notes (Signed)
[facility] Emergency Department Provider Note     Event Date/Time   First MD Initiated Contact with Patient 02/09/23 1832     (approximate)   History   Weakness (Abnormal lab//)   HPI  Katie Stanley is a 37 y.o. female with medical history significant for HTN, DM, Alcohol abuse, palpitations, GERD, tobacco abuse, substance abuse, chronic Hep C, hypokalemia is presenting w/ reports of abnormal labs from her PCP.  Patient had been evaluated for routine labs and was advised to report to the ED for fluid resuscitation due to her abnormal kidney function and low potassium.  Patient would endorse generalized weakness, leg cramping, and poor solid food appetite.  Denies any fevers, chills, sweats, chest pain, shortness of breath.  She does admit to being a regular every day drinker, and is concerned for abnormal lab findings.   Physical Exam   Triage Vital Signs: ED Triage Vitals  Encounter Vitals Group     BP 02/09/23 1553 107/72     Systolic BP Percentile --      Diastolic BP Percentile --      Pulse Rate 02/09/23 1553 (!) 103     Resp 02/09/23 1553 18     Temp 02/09/23 1553 98.5 F (36.9 C)     Temp Source 02/09/23 1553 Oral     SpO2 02/09/23 1553 99 %     Weight 02/09/23 1554 185 lb (83.9 kg)     Height 02/09/23 1554 5\' 3"  (1.6 m)     Head Circumference --      Peak Flow --      Pain Score 02/09/23 1554 7     Pain Loc --      Pain Education --      Exclude from Growth Chart --     Most recent vital signs: Vitals:   02/09/23 2051 02/09/23 2100  BP: (!) 106/51 (!) 117/50  Pulse: (!) 118 (!) 114  Resp:  19  Temp:    SpO2:  94%    General Awake, no distress. NAD HEENT NCAT. PERRL. EOMI. No rhinorrhea. Mucous membranes are moist.  CV:  Good peripheral perfusion. RRR RESP:  Normal effort. CTA ABD:  No distention. Soft, nontender. No rebound, guarding, or rigidity MSK:  Normal ROM or all extremities.  NEURO: CN II-XII grossly intact.       ED Results / Procedures / Treatments   Labs (all labs ordered are listed, but only abnormal results are displayed) Labs Reviewed  BASIC METABOLIC PANEL - Abnormal; Notable for the following components:      Result Value   Sodium 129 (*)    Potassium 2.7 (*)    Chloride 81 (*)    Glucose, Bld 129 (*)    BUN 22 (*)    Creatinine, Ser 1.55 (*)    GFR, Estimated 44 (*)    Anion gap 19 (*)    All other components within normal limits  CBC - Abnormal; Notable for the following components:   RBC 3.01 (*)    Hemoglobin 9.9 (*)    HCT 27.5 (*)    All other components within normal limits  HEPATIC FUNCTION PANEL - Abnormal; Notable for the following components:   Albumin 3.0 (*)    AST 118 (*)    Alkaline Phosphatase 321 (*)    Total Bilirubin 4.0 (*)    Bilirubin, Direct 1.9 (*)    Indirect Bilirubin 2.1 (*)    All other  components within normal limits  URINALYSIS, ROUTINE W REFLEX MICROSCOPIC - Abnormal; Notable for the following components:   Color, Urine AMBER (*)    APPearance HAZY (*)    All other components within normal limits  URINE DRUG SCREEN, QUALITATIVE ([facility] ONLY) - Abnormal; Notable for the following components:   Opiate, Ur Screen POSITIVE (*)    Cannabinoid 50 Ng, Ur [place] POSITIVE (*)    All other components within normal limits  LIPASE, BLOOD - Abnormal; Notable for the following components:   Lipase 55 (*)    All other components within normal limits  MAGNESIUM - Abnormal; Notable for the following components:   Magnesium 1.6 (*)    All other components within normal limits  PHOSPHORUS - Abnormal; Notable for the following components:   Phosphorus 1.9 (*)    All other components within normal limits  ETHANOL  PREGNANCY, URINE  BASIC METABOLIC PANEL  CBC  MAGNESIUM  PHOSPHORUS  TROPONIN I (HIGH SENSITIVITY)    EKG  Vent. rate 114 BPM PR interval 240 ms QRS duration 94 ms QT/QTcB 268/369 ms P-R-T axes 72 93 -84 Sinus tachycardia with 1st  degree A-V block Possible Left atrial enlargement Rightward axis RSR' or QR pattern in V1 suggests right ventricular conduction delay Nonspecific ST and T wave abnormality Abnormal ECG When compared with ECG of 27-Oct-2022 18:13, T wave inversion less evident in Inferior leads  RADIOLOGY   No results found.   PROCEDURES:  Critical Care performed: No  Procedures   MEDICATIONS ORDERED IN ED: Medications  LORazepam (ATIVAN) tablet 1-4 mg (has no administration in time range)    Or  LORazepam (ATIVAN) tablet 2 mg (has no administration in time range)  thiamine (VITAMIN B1) tablet 100 mg (100 mg Oral Given 02/09/23 2158)    Or  thiamine (VITAMIN B1) injection 100 mg ( Intravenous See Alternative 02/09/23 2158)  folic acid (FOLVITE) tablet 1 mg (1 mg Oral Given 02/09/23 2158)  multivitamin with minerals tablet 1 tablet (1 tablet Oral Given 02/09/23 2158)  LORazepam (ATIVAN) tablet 0-4 mg (2 mg Oral Given 02/09/23 2157)    Followed by  LORazepam (ATIVAN) tablet 0-4 mg (has no administration in time range)  potassium chloride SA (KLOR-CON M) CR tablet 40 mEq (has no administration in time range)  ondansetron (ZOFRAN) injection 4 mg (has no administration in time range)  hydrALAZINE (APRESOLINE) injection 5 mg (has no administration in time range)  acetaminophen (TYLENOL) tablet 325 mg (has no administration in time range)  nicotine (NICODERM CQ - dosed in mg/24 hours) patch 21 mg (has no administration in time range)  sodium chloride 0.9 % bolus 1,000 mL (has no administration in time range)  magnesium sulfate IVPB 1 g 100 mL (has no administration in time range)    And  magnesium sulfate IVPB 2 g 50 mL (has no administration in time range)  chlordiazePOXIDE (LIBRIUM) capsule 25 mg (has no administration in time range)  methocarbamol (ROBAXIN) tablet 500 mg (has no administration in time range)  citalopram (CELEXA) tablet 20 mg (has no administration in time range)  pantoprazole  (PROTONIX) EC tablet 40 mg (has no administration in time range)  sucralfate (CARAFATE) 1 GM/10ML suspension 1 g (has no administration in time range)  magnesium oxide (MAG-OX) tablet 400 mg (has no administration in time range)  sodium chloride tablet 1 g (has no administration in time range)  phosphorus (K PHOS NEUTRAL) tablet 500 mg (has no administration in time range)  sodium chloride  0.9 % bolus 1,000 mL (1,000 mLs Intravenous New Bag/Given 02/09/23 2049)  potassium chloride 10 mEq in 100 mL IVPB (0 mEq Intravenous Stopped 02/09/23 2151)  ondansetron (ZOFRAN) injection 4 mg (4 mg Intravenous Given 02/09/23 2156)     IMPRESSION / MDM / ASSESSMENT AND PLAN / ED COURSE  I reviewed the triage vital signs and the nursing notes.                              Differential diagnosis includes, but is not limited to, AKI, electrolyte abnormality, alcohol use disorder, metabolic encephalopathy, alcoholic ketoacidosis  Patient's presentation is most consistent with acute presentation with potential threat to life or bodily function.  Patient's diagnosis is consistent with AKI on presentation.  Patient with significant decrease in her kidney function noted with a BUN/creatinine at 22/1.55 with a GFR down from >60 now 44 on presentation today.  Patient was advised by her PCP to report to the ED for evaluation.  She is found to have a potassium of 2.7 and a sodium decreased to 129.  She was also found to have an anion gap of 19 on presentation.  Patient is stable on presentation but does endorse some anxiety and agitation related to her daily alcohol intake.  CIWA protocol is initiated and she is given lorazepam, as appropriate.  Patient was tachycardic and hypertensive on presentation.  Fluid bolus has been initiated to help correct electrolyte abnormalities.. Patient will be mated to the hospital service for ongoing evaluation management of her AKI and metabolic acidosis secondary to alcohol use disorder.   Patient is extendable and agreeable to the plan of treatment at this time.  Dr. Clyde Lundborg limit the patient to the hospital service as planned.   FINAL CLINICAL IMPRESSION(S) / ED DIAGNOSES   Final diagnoses:  AKI (acute kidney injury) (HCC)  Alcohol use disorder     Rx / DC Orders   ED Discharge Orders     None        Note:  This document was prepared using Dragon voice recognition software and may include unintentional dictation errors.    Lissa Hoard, PA-C 02/09/23 2340    Chesley Noon, MD 02/10/23 [phone]

## 2023-02-09 NOTE — H&P (Addendum)
History and Physical    Katie Stanley WUJ:[id number] DOB: [birth year] DOA: 02/09/2023  Referring MD/NP/PA:   PCP: Dorcas Carrow, DO   Patient coming from:  The patient is coming from home.     Chief Complaint: Leg cramps, weakness and abnormal lab.  HPI: Katie Stanley is a 37 y.o. female with medical history significant of diet-controlled diabetes, polysubstance abuse, tobacco abuse, alcohol abuse, GERD, depression with anxiety, anemia, HCV, gallstone, obesity, who presents with leg cramps, weakness and abnormal lab.  Patient states that she has generalized weakness, fatigue, poor appetite, decreased oral intake and leg cramps in the past several days.  Patient was seen by PCP today and found to have hypokalemia and AKI.  Patient was sent to ED for further evaluation and treatment.  Patient has nausea, no vomiting, diarrhea or abdominal pain.  Denies chest pain, cough, shortness of breath.  No fever or chills.  She states that she has an anal fissure, sometimes has bleeding.  Data reviewed independently and ED Course: pt was found to have potassium 2.7, magnesium 1.6, phosphorus 1.9, sodium 129, AKI with creatinine 1.55, BUN 22, GFR 44 (recent baseline creatinine 0.83 on 11/01/2022), abnormal liver function (ALP 321, AST 118, ALT 37, total bilirubin 4.0, direct bilirubin 1.9), positive UDS for opiate and cannabinoid.  Temperature normal, blood pressure 117/50, heart rate of 118, RR 23, oxygen sat 94% on room air.  Patient is placed on TeleMed for observation.  EKG: I have personally reviewed.  Sinus rhythm, QTc 369, no ischemic change.   Review of Systems:   General: no fevers, chills, no body weight gain, has poor appetite, has fatigue HEENT: no blurry vision, hearing changes or sore throat Respiratory: no dyspnea, coughing, wheezing CV: no chest pain, no palpitations GI: has nausea, no vomiting, abdominal pain, diarrhea, constipation GU: no dysuria, burning on urination,  increased urinary frequency, hematuria  Ext: no leg edema Neuro: no unilateral weakness, numbness, or tingling, no vision change or hearing loss Skin: no rash, no skin tear. MSK: No muscle spasm, no deformity, no limitation of range of movement in spin. Has leg cramps Heme: No easy bruising.  Travel history: No recent long distant travel.   Allergy:  Allergies  Allergen Reactions   Penicillins Swelling    Past Medical History:  Diagnosis Date   Anxiety    Biliary calculi 12/07/2009   Cholelithiasis    Depression    GERD (gastroesophageal reflux disease)    Hepatitis C    body "self healed" per patient   Palpitations    Retained intrauterine contraceptive device (IUD) 01/20/2020   Substance abuse (HCC)     Past Surgical History:  Procedure Laterality Date   CHOLECYSTECTOMY     2011   COLONOSCOPY WITH PROPOFOL N/A 09/20/2022   Procedure: COLONOSCOPY WITH PROPOFOL;  Surgeon: Midge Minium, MD;  Location: [facility] ENDOSCOPY;  Service: Endoscopy;  Laterality: N/A;   ESOPHAGOGASTRODUODENOSCOPY N/A 09/29/2021   Procedure: ESOPHAGOGASTRODUODENOSCOPY (EGD);  Surgeon: Midge Minium, MD;  Location: [facility] ENDOSCOPY;  Service: Endoscopy;  Laterality: N/A;   ESOPHAGOGASTRODUODENOSCOPY (EGD) WITH PROPOFOL N/A 01/21/2018   Procedure: ESOPHAGOGASTRODUODENOSCOPY (EGD) WITH Biopsy;  Surgeon: Midge Minium, MD;  Location: [facility];  Service: Endoscopy;  Laterality: N/A;   HYSTEROSCOPY WITH D & C N/A 01/20/2020   Procedure: DILATATION AND CURETTAGE /HYSTEROSCOPY;  Surgeon: Conard Novak, MD;  Location: [facility] ORS;  Service: Gynecology;  Laterality: N/A;   INTRAUTERINE DEVICE (IUD) INSERTION N/A 01/20/2020   Procedure: INTRAUTERINE DEVICE (  IUD) INSERTION MIRENA IUD;  Surgeon: Conard Novak, MD;  Location: [facility] ORS;  Service: Gynecology;  Laterality: N/A;   IUD REMOVAL N/A 01/20/2020   Procedure: INTRAUTERINE DEVICE (IUD) REMOVAL;  Surgeon: Conard Novak, MD;  Location: [facility] ORS;   Service: Gynecology;  Laterality: N/A;   PORT-A-CATH REMOVAL  02/08/2022   PORTA CATH INSERTION N/A 01/01/2019   Procedure: PORTA CATH INSERTION;  Surgeon: Annice Needy, MD;  Location: [facility] INVASIVE CV LAB;  Service: Cardiovascular;  Laterality: N/A;   VASCULAR SURGERY      Social History:  reports that she has been smoking cigarettes. She has a 12 pack-year smoking history. She has never used smokeless tobacco. She reports current alcohol use. She reports current drug use. Drug: Marijuana.  Family History:  Family History  Problem Relation Age of Onset   Hypertension Mother    Diabetes Father    Hypertension Father    Liver cancer Maternal Grandmother    Hypertension Maternal Grandfather    Breast cancer Paternal Grandmother    Heart disease Paternal Grandfather      Prior to Admission medications   Medication Sig Start Date End Date Taking? Authorizing Provider  citalopram (CELEXA) 20 MG tablet TAKE 1 TABLET(20 MG) BY MOUTH DAILY 08/17/22   Johnson, Megan P, DO  dicyclomine (BENTYL) 10 MG capsule Take 1 capsule (10 mg total) by mouth 2 (two) times daily before lunch and supper. Patient not taking: Reported on 02/05/2023 11/28/22   Mecum, Erin E, PA-C  famotidine (PEPCID) 20 MG tablet Take 1 tablet (20 mg total) by mouth 2 (two) times daily. 11/01/22 12/01/22  Tresa Moore, MD  lidocaine (LIDODERM) 5 % Place 1 patch onto the skin daily. Remove & Discard patch within 12 hours or as directed by MD 04/28/22   Olevia Perches P, DO  lisinopril (ZESTRIL) 10 MG tablet Take 1 tablet (10 mg total) by mouth daily. 08/17/22   Johnson, Megan P, DO  Magnesium 250 MG TABS Take 1 tablet (250 mg total) by mouth 2 (two) times daily. 07/11/22   Cannady, Corrie Dandy T, NP  methocarbamol (ROBAXIN) 500 MG tablet Take 1 tablet (500 mg total) by mouth 2 (two) times daily. 10/20/22   Katha Cabal, DO  metoprolol succinate (TOPROL-XL) 25 MG 24 hr tablet Take 1 tablet (25 mg total) by mouth daily. 08/17/22   Johnson,  Megan P, DO  pantoprazole (PROTONIX) 40 MG tablet Take 1 tablet (40 mg total) by mouth 2 (two) times daily before a meal. 11/01/22 12/01/22  Sreenath, Sudheer B, MD  potassium chloride SA (KLOR-CON M) 20 MEQ tablet Take 1 tablet (20 mEq total) by mouth daily. 02/06/23   Johnson, Megan P, DO  sucralfate (CARAFATE) 1 GM/10ML suspension Take 10 mLs (1 g total) by mouth 4 (four) times daily -  with meals and at bedtime. 08/17/22   Dorcas Carrow, DO    Physical Exam: Vitals:   02/09/23 2000 02/09/23 2030 02/09/23 2051 02/09/23 2100  BP: 90/65 (!) 106/51 (!) 106/51 (!) 117/50  Pulse: (!) 115 (!) 117 (!) 118 (!) 114  Resp: 20 19  19   Temp:      TempSrc:      SpO2: 95% 96%  94%  Weight:      Height:       General: Not in acute distress.  Dry mucous membrane HEENT:       Eyes: PERRL, EOMI, no jaundice       ENT: No discharge from  the ears and nose, no pharynx injection, no tonsillar enlargement.        Neck: No JVD, no bruit, no mass felt. Heme: No neck lymph node enlargement. Cardiac: S1/S2, RRR, No murmurs, No gallops or rubs. Respiratory: No rales, wheezing, rhonchi or rubs. GI: Soft, nondistended, nontender, no rebound pain, no organomegaly, BS present. GU: No hematuria Ext: No pitting leg edema bilaterally. 1+DP/PT pulse bilaterally. Musculoskeletal: No joint deformities, No joint redness or warmth, no limitation of ROM in spin. Skin: No rashes.  Neuro: Alert, oriented X3, cranial nerves II-XII grossly intact, moves all extremities normally.  Psych: Patient is not psychotic, no suicidal or hemocidal ideation.  Labs on Admission: I have personally reviewed following labs and imaging studies  CBC: Recent Labs  Lab 02/06/23 1426 02/09/23 1600  WBC 9.1 9.3  NEUTROABS 6.8  --   HGB 10.8* 9.9*  HCT 29.4* 27.5*  MCV 90.2 91.4  PLT 185 236   Basic Metabolic Panel: Recent Labs  Lab 02/06/23 1426 02/08/23 1412 02/09/23 1600 02/09/23 2034  NA 125* 127* 129*  --   K 2.3* 2.9*  2.7*  --   CL 75* 78* 81*  --   CO2 30 30 29   --   GLUCOSE 121* 94 129*  --   BUN 23* 25* 22*  --   CREATININE 1.72* 2.13* 1.55*  --   CALCIUM 8.7* 8.6* 8.9  --   MG  --   --  1.6*  --   PHOS  --   --   --  1.9*   GFR: Estimated Creatinine Clearance: 51 mL/min (A) (by C-G formula based on SCr of 1.55 mg/dL (H)). Liver Function Tests: Recent Labs  Lab 02/06/23 1426 02/09/23 2036  AST 101* 118*  ALT 33 37  ALKPHOS 317* 321*  BILITOT 5.8* 4.0*  PROT 6.7 7.0  ALBUMIN 2.9* 3.0*   Recent Labs  Lab 02/09/23 2036  LIPASE 55*   No results for input(s): "AMMONIA" in the last 168 hours. Coagulation Profile: No results for input(s): "INR", "PROTIME" in the last 168 hours. Cardiac Enzymes: No results for input(s): "CKTOTAL", "CKMB", "CKMBINDEX", "TROPONINI" in the last 168 hours. BNP (last 3 results) No results for input(s): "PROBNP" in the last 8760 hours. HbA1C: No results for input(s): "HGBA1C" in the last 72 hours. CBG: No results for input(s): "GLUCAP" in the last 168 hours. Lipid Profile: No results for input(s): "CHOL", "HDL", "LDLCALC", "TRIG", "CHOLHDL", "LDLDIRECT" in the last 72 hours. Thyroid Function Tests: No results for input(s): "TSH", "T4TOTAL", "FREET4", "T3FREE", "THYROIDAB" in the last 72 hours. Anemia Panel: No results for input(s): "VITAMINB12", "FOLATE", "FERRITIN", "TIBC", "IRON", "RETICCTPCT" in the last 72 hours. Urine analysis:    Component Value Date/Time   COLORURINE AMBER (A) 02/09/2023 1947   APPEARANCEUR HAZY (A) 02/09/2023 1947   APPEARANCEUR Clear 02/05/2023 1503   LABSPEC 1.015 02/09/2023 1947   PHURINE 5.0 02/09/2023 1947   GLUCOSEU NEGATIVE 02/09/2023 1947   HGBUR NEGATIVE 02/09/2023 1947   BILIRUBINUR NEGATIVE 02/09/2023 1947   BILIRUBINUR Negative 02/05/2023 1503   KETONESUR NEGATIVE 02/09/2023 1947   PROTEINUR NEGATIVE 02/09/2023 1947   NITRITE NEGATIVE 02/09/2023 1947   LEUKOCYTESUR NEGATIVE 02/09/2023 1947   Sepsis  Labs: @LABRCNTIP (procalcitonin:4,lacticidven:4) ) Recent Results (from the past 240 hour(s))  Microscopic Examination     Status: None   Collection Time: 02/05/23  3:03 PM   Urine  Result Value Ref Range Status   WBC, UA None seen 0 - 5 /hpf Final  RBC, Urine None seen 0 - 2 /hpf Final   Epithelial Cells (non renal) 0-10 0 - 10 /hpf Final   Bacteria, UA None seen None seen/Few Final     Radiological Exams on Admission: No results found.    Assessment/Plan Active Problems:   AKI (acute kidney injury) (HCC)   Disorder of electrolytes   Hypokalemia   Hypomagnesemia   Hyponatremia   Hypophosphatemia   Normocytic anemia   Diabetes mellitus without complication (HCC)   GERD (gastroesophageal reflux disease)   Polysubstance abuse (HCC)   Alcohol abuse   Tobacco abuse   Depression with anxiety   Obesity (BMI 30-39.9)   HTN (hypertension)   Abnormal liver function   Assessment and Plan:  AKI (acute kidney injury) (HCC): Likely due to dehydration.  Urinalysis negative for UTI.  -Placed on telemetry bed of observation -Avoid using renal toxic medications -Hold lisinopril -IV fluid: 2 L normal saline  Disorder of electrolytes, Hypokalemia, Hypomagnesemia hypophosphatemia and Hyponatremia -Consulted pharmacy for electrolytes monitoring -Repleted potassium, phosphorus and magnesium -Sodium chloride tablet 1 g twice daily -Patient will be given 2 L normal saline bolus  Normocytic anemia: Hemoglobin 9.9 (10.8 on 02/06/2023, and 8.5 on 10/29/2022).  Patient states that sometimes she has bleeding from anal fissure. -Monitor hemoglobin level by CBC  Diet controlled diabetes mellitus without complication [facility]): Recent A1c 6.6.  Blood sugar 129 today. -Check CBC every morning  GERD (gastroesophageal reflux disease) -Protonix and Carafate  Polysubstance abuse (HCC), Alcohol abuse and Tobacco abuse -Did counseling about importance of quitting substance use -CIWA  protocol -Nicotine patch -Give 25 mg of Librium now  Depression with anxiety -Continue home medications  Obesity (BMI 30-39.9): Body weight 83.9 kg, BMI 32.77 -Encourage losing weight -Exercise and healthy diet  Hypertension: Blood pressure is soft -Hold lisinopril due to AKI -Hold metoprolol due to soft blood pressure -IV hydralazine as needed  Abnormal liver function: Possibly due to combination of alcohol abuse and history of HCV -Will carefully use Tylenol at low-dose 325 mg for pain as needed (patient cannot use NSAIDs due to AKI)    DVT ppx: SCD  Code Status: Full code    Family Communication: not done, no family member is at bed side.       Disposition Plan:  Anticipate discharge back to previous environment  Consults called:  none  Admission status and Level of care: Telemetry Medical:  for obs   Dispo: The patient is from: Home              Anticipated d/c is to: Home              Anticipated d/c date is: 1 day              Patient currently is not medically stable to d/c.    Severity of Illness:  The appropriate patient status for this patient is OBSERVATION. Observation status is judged to be reasonable and necessary in order to provide the required intensity of service to ensure the patient's safety. The patient's presenting symptoms, physical exam findings, and initial radiographic and laboratory data in the context of their medical condition is felt to place them at decreased risk for further clinical deterioration. Furthermore, it is anticipated that the patient will be medically stable for discharge from the hospital within 2 midnights of admission.        Date of Service 02/09/2023    Lorretta Harp [facility]   If 7PM-7AM, please contact night-coverage [email or web address]  02/09/2023, 11:27 PM

## 2023-02-09 NOTE — ED Notes (Signed)
Pt up to the restroom for a urine sample

## 2023-02-09 NOTE — ED Triage Notes (Signed)
Pt went to PCP for physical and labs. Was called today to come to ER for fluids due to abnormal kidney function and potassium. Pt states she feels weak and legs are cramping.

## 2023-02-09 NOTE — ED Notes (Signed)
Patient placed on monitor due to potassium of 2.7.

## 2023-02-09 NOTE — ED Notes (Addendum)
Unsuccessful IV attempt. Patient states she usually needs ultrasound to place an IV. Jenise Menshew, PA-C aware. Patient states she has had poor PO intake for "awhile" due to dry heaving clear liquid and constant nausea.

## 2023-02-09 NOTE — Progress Notes (Signed)
PHARMACY CONSULT NOTE  Pharmacy Consult for Electrolyte Monitoring and Replacement   Recent Labs: Potassium (mmol/L)  Date Value  02/09/2023 2.7 (LL)   Magnesium (mg/dL)  Date Value  16/01/9603 1.6 (L)   Calcium (mg/dL)  Date Value  54/12/8117 8.9   Albumin (g/dL)  Date Value  14/78/2956 3.0 (L)  01/06/2016 4.2   Phosphorus (mg/dL)  Date Value  21/30/8657 1.9 (L)   Sodium (mmol/L)  Date Value  02/09/2023 129 (L)  01/06/2016 140    Assessment: Pt is 37 yo female presenting to ED from PCP d/t abnormal labs.  Pharmacy consulted to assist with electrolyte monitoring and replacement as indicated.  Goal of Therapy:  Electrolytes within normal limits  Plan:  Admitting provider ordered initial Na, K, and Mag supplementation Ordered Phos Neutral 500 mg q4h x 2 doses Will recheck AM labs Pharmacy will continue to follow  Otelia Sergeant, PharmD, [facility] 02/09/2023 11:26 PM

## 2023-02-10 ENCOUNTER — Observation Stay: Payer: Medicaid Other

## 2023-02-10 DIAGNOSIS — F1090 Alcohol use, unspecified, uncomplicated: Secondary | ICD-10-CM

## 2023-02-10 DIAGNOSIS — K700 Alcoholic fatty liver: Secondary | ICD-10-CM | POA: Diagnosis present

## 2023-02-10 DIAGNOSIS — E872 Acidosis, unspecified: Secondary | ICD-10-CM | POA: Diagnosis present

## 2023-02-10 DIAGNOSIS — E861 Hypovolemia: Secondary | ICD-10-CM | POA: Diagnosis present

## 2023-02-10 DIAGNOSIS — F1020 Alcohol dependence, uncomplicated: Secondary | ICD-10-CM | POA: Diagnosis not present

## 2023-02-10 DIAGNOSIS — D638 Anemia in other chronic diseases classified elsewhere: Secondary | ICD-10-CM | POA: Diagnosis present

## 2023-02-10 DIAGNOSIS — I10 Essential (primary) hypertension: Secondary | ICD-10-CM | POA: Diagnosis present

## 2023-02-10 DIAGNOSIS — G4733 Obstructive sleep apnea (adult) (pediatric): Secondary | ICD-10-CM | POA: Diagnosis not present

## 2023-02-10 DIAGNOSIS — F32A Depression, unspecified: Secondary | ICD-10-CM | POA: Diagnosis present

## 2023-02-10 DIAGNOSIS — F419 Anxiety disorder, unspecified: Secondary | ICD-10-CM | POA: Diagnosis present

## 2023-02-10 DIAGNOSIS — N179 Acute kidney failure, unspecified: Secondary | ICD-10-CM | POA: Diagnosis not present

## 2023-02-10 DIAGNOSIS — K7010 Alcoholic hepatitis without ascites: Secondary | ICD-10-CM | POA: Diagnosis present

## 2023-02-10 DIAGNOSIS — K219 Gastro-esophageal reflux disease without esophagitis: Secondary | ICD-10-CM | POA: Diagnosis present

## 2023-02-10 DIAGNOSIS — F1910 Other psychoactive substance abuse, uncomplicated: Secondary | ICD-10-CM | POA: Diagnosis present

## 2023-02-10 DIAGNOSIS — K760 Fatty (change of) liver, not elsewhere classified: Secondary | ICD-10-CM | POA: Diagnosis not present

## 2023-02-10 DIAGNOSIS — R748 Abnormal levels of other serum enzymes: Secondary | ICD-10-CM

## 2023-02-10 DIAGNOSIS — E871 Hypo-osmolality and hyponatremia: Secondary | ICD-10-CM | POA: Diagnosis present

## 2023-02-10 DIAGNOSIS — R197 Diarrhea, unspecified: Secondary | ICD-10-CM | POA: Diagnosis present

## 2023-02-10 DIAGNOSIS — B159 Hepatitis A without hepatic coma: Secondary | ICD-10-CM | POA: Diagnosis present

## 2023-02-10 DIAGNOSIS — K863 Pseudocyst of pancreas: Secondary | ICD-10-CM | POA: Diagnosis present

## 2023-02-10 DIAGNOSIS — E119 Type 2 diabetes mellitus without complications: Secondary | ICD-10-CM | POA: Diagnosis present

## 2023-02-10 DIAGNOSIS — E876 Hypokalemia: Secondary | ICD-10-CM | POA: Diagnosis present

## 2023-02-10 DIAGNOSIS — E860 Dehydration: Secondary | ICD-10-CM | POA: Diagnosis present

## 2023-02-10 DIAGNOSIS — F17210 Nicotine dependence, cigarettes, uncomplicated: Secondary | ICD-10-CM | POA: Diagnosis present

## 2023-02-10 DIAGNOSIS — E669 Obesity, unspecified: Secondary | ICD-10-CM | POA: Diagnosis present

## 2023-02-10 DIAGNOSIS — Z8249 Family history of ischemic heart disease and other diseases of the circulatory system: Secondary | ICD-10-CM | POA: Diagnosis not present

## 2023-02-10 DIAGNOSIS — Z6834 Body mass index (BMI) 34.0-34.9, adult: Secondary | ICD-10-CM | POA: Diagnosis not present

## 2023-02-10 LAB — CBC
HCT: 23.7 % — ABNORMAL LOW (ref 36.0–46.0)
Hemoglobin: 8.5 g/dL — ABNORMAL LOW (ref 12.0–15.0)
MCH: 32.9 pg (ref 26.0–34.0)
MCHC: 35.9 g/dL (ref 30.0–36.0)
MCV: 91.9 fL (ref 80.0–100.0)
Platelets: 220 10*3/uL (ref 150–400)
RBC: 2.58 MIL/uL — ABNORMAL LOW (ref 3.87–5.11)
RDW: 14.8 % (ref 11.5–15.5)
WBC: 6.9 10*3/uL (ref 4.0–10.5)
nRBC: 0 % (ref 0.0–0.2)

## 2023-02-10 LAB — BASIC METABOLIC PANEL
Anion gap: 12 (ref 5–15)
BUN: 24 mg/dL — ABNORMAL HIGH (ref 6–20)
CO2: 32 mmol/L (ref 22–32)
Calcium: 8.2 mg/dL — ABNORMAL LOW (ref 8.9–10.3)
Chloride: 90 mmol/L — ABNORMAL LOW (ref 98–111)
Creatinine, Ser: 1.79 mg/dL — ABNORMAL HIGH (ref 0.44–1.00)
GFR, Estimated: 37 mL/min — ABNORMAL LOW (ref >60)
Glucose, Bld: 110 mg/dL — ABNORMAL HIGH (ref 70–99)
Potassium: 3.7 mmol/L (ref 3.5–5.1)
Sodium: 134 mmol/L — ABNORMAL LOW (ref 135–145)

## 2023-02-10 LAB — C DIFFICILE QUICK SCREEN W PCR REFLEX
C Diff antigen: POSITIVE — AB
C Diff toxin: NEGATIVE

## 2023-02-10 LAB — AMMONIA: Ammonia: 58 umol/L — ABNORMAL HIGH (ref 9–35)

## 2023-02-10 LAB — PREGNANCY, URINE: Preg Test, Ur: NEGATIVE

## 2023-02-10 LAB — CBG MONITORING, ED: Glucose-Capillary: 105 mg/dL — ABNORMAL HIGH (ref 70–99)

## 2023-02-10 LAB — CLOSTRIDIUM DIFFICILE BY PCR, REFLEXED: Toxigenic C. Difficile by PCR: NEGATIVE

## 2023-02-10 LAB — MAGNESIUM: Magnesium: 2.3 mg/dL (ref 1.7–2.4)

## 2023-02-10 LAB — PHOSPHORUS: Phosphorus: 2.8 mg/dL (ref 2.5–4.6)

## 2023-02-10 MED ORDER — ALUM & MAG HYDROXIDE-SIMETH 200-200-20 MG/5ML PO SUSP
30.0000 mL | ORAL | Status: DC | PRN
  Administered 2023-02-10: 30 mL via ORAL
  Filled 2023-02-10: qty 30

## 2023-02-10 MED ORDER — SODIUM CHLORIDE 0.9 % IV SOLN: INTRAVENOUS | Status: AC

## 2023-02-10 MED ORDER — VANCOMYCIN HCL 125 MG PO CAPS
125.0000 mg | ORAL_CAPSULE | Freq: Four times a day (QID) | ORAL | Status: DC
  Administered 2023-02-10 – 2023-02-11 (×3): 125 mg via ORAL
  Filled 2023-02-10 (×7): qty 1

## 2023-02-10 NOTE — Progress Notes (Signed)
PROGRESS NOTE    Katie Stanley  WUJ:[id number] DOB: [birth year] DOA: 02/09/2023 PCP: Dorcas Carrow, DO   Brief Narrative: 37 year old with past medical history significant for diet-controlled diabetes, polysubstance abuse, tobacco abuse, alcohol abuse, GERD, depression with anxiety, anemia, hepatitis C, gallstone, obesity presents with legs cramps, weakness and abnormal labs.  She reported generalized weakness, fatigue, poor appetite, decreased oral intakes.  She was evaluated by PCP and was found to have hypokalemia and AKI.  Sent to the ED for further evaluation.  Patient found to have AKI with a creatinine of 1.5, transaminases.  Assessment & Plan:   Active Problems:   AKI (acute kidney injury) (HCC)   Disorder of electrolytes   Hypokalemia   Hypomagnesemia   Hyponatremia   Hypophosphatemia   Normocytic anemia   Diabetes mellitus without complication (HCC)   GERD (gastroesophageal reflux disease)   Polysubstance abuse (HCC)   Alcohol abuse   Tobacco abuse   Depression with anxiety   Obesity (BMI 30-39.9)   HTN (hypertension)   Abnormal liver function   1-AKI in the setting of hypovolemia hemodynamics. -Continue with IV fluids -Agree with holding lisinopril -Check Renal US.  -Strict I and O.   Hypokalemia, hypomagnesemia, hypophosphatemia and hyponatremia: -Replaced.  -Continue to monitor.  -Sodium improved.   Diarrhea;  Check for C diff.  Antigen positive will proceed with treatment, she is symptomatic.   Transaminase, Hyperbilirubinemia: Concern for cirrhosis.  -Possible Cirrhosis on previous CT abdomen pelvis.  -Will proceed with RUQ US>  -GI consulted.  -Check ammonia level.   2.5 cm low density lesion head of Pancreas: Inflammation or neoplastic process.  -Needs MRI , will follow RUQ [place] first.   Normocytic anemia: -Monitor hb.  -check anemia panel.    Diet-controlled diabetes type 2: -SSI  GERD: -PPI BID> , Sucralfate   Polysubstance  abuse, alcohol abuse, tobacco abuse -Continue  to monitor on CIWA. -Counseling.   Depression with anxiety: -Continue with Celexa  Obesity: -needs life style modification.   Hypertension: PRN hydralazine.  BP soft, hold metoprolol./  Hold Lisinopril due to AKI.             Estimated body mass index is 32.77 kg/m as calculated from the following:   Height as of this encounter: 5\' 3"  (1.6 m).   Weight as of this encounter: 83.9 kg.   DVT prophylaxis: SCD Code Status: full code Family Communication: care discussed with patient.  Disposition Plan:  Status is: Observation The patient remains OBS appropriate and will d/c before 2 midnights.    Consultants:  GI  Procedures:  [place]  Antimicrobials:    Subjective: She report diarrhea, can't tell how often. Feels weak. Poor historian.   Objective: Vitals:   02/10/23 0130 02/10/23 0221 02/10/23 0400 02/10/23 0609  BP: (!) 113/58  (!) 115/58 105/64  Pulse: (!) 101  90 87  Resp: 19  18 16   Temp:  98.6 F (37 C)    TempSrc:  Oral    SpO2: 96%  94% 97%  Weight:      Height:        Intake/Output Summary (Last 24 hours) at 02/10/2023 0839 Last data filed at 02/09/2023 2151 Gross per 24 hour  Intake 100 ml  Output --  Net 100 ml   Filed Weights   02/09/23 1554  Weight: 83.9 kg    Examination:  General exam: Appears calm and comfortable  Respiratory system: Clear to auscultation. Respiratory effort normal. Cardiovascular system: S1 &  S2 heard, RRR. No JVD, murmurs, rubs, gallops or clicks. No pedal edema. Gastrointestinal system: Abdomen is nondistended, soft and nontender. No organomegaly or masses felt. Normal bowel sounds heard. Central nervous system: Alert  Extremities: Symmetric 5 x 5 power.    Data Reviewed: I have personally reviewed following labs and imaging studies  CBC: Recent Labs  Lab 02/06/23 1426 02/09/23 1600 02/10/23 0727  WBC 9.1 9.3 6.9  NEUTROABS 6.8  --   --   HGB 10.8*  9.9* 8.5*  HCT 29.4* 27.5* 23.7*  MCV 90.2 91.4 91.9  PLT 185 236 220   Basic Metabolic Panel: Recent Labs  Lab 02/06/23 1426 02/08/23 1412 02/09/23 1600 02/09/23 2034 02/10/23 0727  NA 125* 127* 129*  --  134*  K 2.3* 2.9* 2.7*  --  3.7  CL 75* 78* 81*  --  90*  CO2 30 30 29   --  32  GLUCOSE 121* 94 129*  --  110*  BUN 23* 25* 22*  --  24*  CREATININE 1.72* 2.13* 1.55*  --  1.79*  CALCIUM 8.7* 8.6* 8.9  --  8.2*  MG  --   --  1.6*  --  2.3  PHOS  --   --   --  1.9* 2.8   GFR: Estimated Creatinine Clearance: 44.2 mL/min (A) (by C-G formula based on SCr of 1.79 mg/dL (H)). Liver Function Tests: Recent Labs  Lab 02/06/23 1426 02/09/23 2036  AST 101* 118*  ALT 33 37  ALKPHOS 317* 321*  BILITOT 5.8* 4.0*  PROT 6.7 7.0  ALBUMIN 2.9* 3.0*   Recent Labs  Lab 02/09/23 2036  LIPASE 55*   No results for input(s): "AMMONIA" in the last 168 hours. Coagulation Profile: No results for input(s): "INR", "PROTIME" in the last 168 hours. Cardiac Enzymes: No results for input(s): "CKTOTAL", "CKMB", "CKMBINDEX", "TROPONINI" in the last 168 hours. BNP (last 3 results) No results for input(s): "PROBNP" in the last 8760 hours. HbA1C: No results for input(s): "HGBA1C" in the last 72 hours. CBG: Recent Labs  Lab 02/10/23 0744  GLUCAP 105*   Lipid Profile: No results for input(s): "CHOL", "HDL", "LDLCALC", "TRIG", "CHOLHDL", "LDLDIRECT" in the last 72 hours. Thyroid Function Tests: No results for input(s): "TSH", "T4TOTAL", "FREET4", "T3FREE", "THYROIDAB" in the last 72 hours. Anemia Panel: No results for input(s): "VITAMINB12", "FOLATE", "FERRITIN", "TIBC", "IRON", "RETICCTPCT" in the last 72 hours. Sepsis Labs: No results for input(s): "PROCALCITON", "LATICACIDVEN" in the last 168 hours.  Recent Results (from the past 240 hour(s))  Microscopic Examination     Status: None   Collection Time: 02/05/23  3:03 PM   Urine  Result Value Ref Range Status   WBC, UA None seen 0  - 5 /hpf Final   RBC, Urine None seen 0 - 2 /hpf Final   Epithelial Cells (non renal) 0-10 0 - 10 /hpf Final   Bacteria, UA None seen None seen/Few Final         Radiology Studies: No results found.      Scheduled Meds:  citalopram  20 mg Oral Daily   folic acid  1 mg Oral Daily   LORazepam  0-4 mg Oral Q6H   Followed by   Melene Muller ON 02/11/2023] LORazepam  0-4 mg Oral Q12H   magnesium oxide  400 mg Oral BID   multivitamin with minerals  1 tablet Oral Daily   nicotine  21 mg Transdermal Daily   pantoprazole  40 mg Oral BID AC  sodium chloride  1 g Oral BID WC   sucralfate  1 g Oral TID WC & HS   thiamine  100 mg Oral Daily   Or   thiamine  100 mg Intravenous Daily   Continuous Infusions:   LOS: 0 days    Time spent: 35 minutes    Shatina Streets A Jasten Guyette, MD [facility]   If 7PM-7AM, please contact night-coverage [email or web address]  02/10/2023, 8:39 AM

## 2023-02-10 NOTE — ED Notes (Signed)
Contacted lab for patient's 0500 labs. Lab states "we will put them on the list."

## 2023-02-10 NOTE — ED Notes (Addendum)
Contacted lab about collecting 0500 labs, and lab states a phlebotomist would be "down soon."

## 2023-02-10 NOTE — Consult Note (Signed)
Katie Repress, MD [address]  [address]  [place], [place] [address]  Main: [phone]  Fax: [phone] Pager: [phone]   Consultation  Referring Provider:     No ref. provider found Primary Care Physician:  Katie Carrow, DO Primary Gastroenterologist:  Dr. Servando Snare         Reason for Consultation: Elevated LFTs  Date of Admission:  02/09/2023 Date of Consultation:  02/11/2023         HPI:   Katie Stanley is a 37 y.o. female known history of alcohol abuse, hep C, HCV not detected, alcoholic hepatitis is admitted on 11/1 due to abnormal labs.  She saw Dr. Olevia Stanley in the office, labs revealed severe hyponatremia, hypokalemia, worsening renal function and elevated LFTs.  Patient is hemodynamically stable in the ER.  GI is consulted for management of alcoholic hepatitis.  She had an ultrasound liver which showed hepatic steatosis, history of previous cholecystectomy, normal CBD. When I saw the patient, she is eating her lunch, denies any abdominal pain, melena, hematemesis, nausea or vomiting.  Last drink was the night before she presented to ER.  She admits to ongoing alcohol use, lives with her children and husband at home and she reports feeling safe at home.     Latest Ref Rng & Units 02/11/2023    4:05 AM 02/09/2023    8:36 PM 02/06/2023    2:26 PM  Hepatic Function  Total Protein 6.5 - 8.1 g/dL 6.2  7.0  6.7   Albumin 3.5 - 5.0 g/dL 2.5  3.0  2.9   AST 15 - 41 U/L 90  118  101   ALT 0 - 44 U/L 32  37  33   Alk Phosphatase 38 - 126 U/L 263  321  317   Total Bilirubin 0.3 - 1.2 mg/dL 2.9  4.0  5.8   Bilirubin, Direct 0.0 - 0.2 mg/dL  1.9      NSAIDs: None  Antiplts/Anticoagulants/Anti thrombotics: None  GI Procedures:  Colonoscopy 09/20/2022 - One 4 mm polyp in the transverse colon, removed with a cold biopsy forceps. Resected and retrieved. - Non- bleeding internal hemorrhoids. - Biopsies were taken with a cold forceps for histology in the entire  colon. - Random biopsies were obtained. Tubular adenoma Hyperplastic polyp  Upper endoscopy 09/29/2021 - Normal esophagus. - Gastritis. Biopsied. - Normal examined duodenum.  DIAGNOSIS:  A. 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  - NEGATIVE FOR H. PYLORI BY IMMUNOHISTOCHEMISTRY (I[facility]).  - NEGATIVE FOR INTESTINAL METAPLASIA, DYSPLASIA, AND MALIGNANCY.   Past Medical History:  Diagnosis Date   Anxiety    Biliary calculi 12/07/2009   Cholelithiasis    Depression    GERD (gastroesophageal reflux disease)    Hepatitis C    body "self healed" per patient   Palpitations    Retained intrauterine contraceptive device (IUD) 01/20/2020   Substance abuse (HCC)     Past Surgical History:  Procedure Laterality Date   CHOLECYSTECTOMY     2011   COLONOSCOPY WITH PROPOFOL N/A 09/20/2022   Procedure: COLONOSCOPY WITH PROPOFOL;  Surgeon: Katie Minium, MD;  Location: [facility] ENDOSCOPY;  Service: Endoscopy;  Laterality: N/A;   ESOPHAGOGASTRODUODENOSCOPY N/A 09/29/2021   Procedure: ESOPHAGOGASTRODUODENOSCOPY (EGD);  Surgeon: Katie Minium, MD;  Location: [facility] ENDOSCOPY;  Service: Endoscopy;  Laterality: N/A;   ESOPHAGOGASTRODUODENOSCOPY (EGD) WITH PROPOFOL N/A 01/21/2018   Procedure: ESOPHAGOGASTRODUODENOSCOPY (EGD) WITH Biopsy;  Surgeon: Katie Minium, MD;  Location: [facility];  Service: Endoscopy;  Laterality: N/A;   HYSTEROSCOPY WITH D & C N/A 01/20/2020   Procedure: DILATATION AND CURETTAGE /HYSTEROSCOPY;  Surgeon: Katie Novak, MD;  Location: [facility] ORS;  Service: Gynecology;  Laterality: N/A;   INTRAUTERINE DEVICE (IUD) INSERTION N/A 01/20/2020   Procedure: INTRAUTERINE DEVICE (IUD) INSERTION MIRENA IUD;  Surgeon: Katie Novak, MD;  Location: [facility] OR[facility];  Service: Gynecology;  Laterality: N/A;   IUD REMOVAL N/A 01/20/2020   Procedure: INTRAUTERINE DEVICE (IUD) REMOVAL;  Surgeon: Katie Novak, MD;  Location: [facility] ORS;  Service:  Gynecology;  Laterality: N/A;   PORT-A-CATH REMOVAL  02/08/2022   PORTA CATH INSERTION N/A 01/01/2019   Procedure: PORTA CATH INSERTION;  Surgeon: Katie Needy, MD;  Location: [facility] INVASIVE CV [facility];  Service: Cardiovascular;  Laterality: N/A;   VASCULAR SURGERY       Current Facility-Administered Medications:    0.9 %  sodium chloride infusion, , Intravenous, Continuous, Regalado, Belkys A, MD, Last Rate: 75 mL/hr at 02/11/23 1021, New Bag at 02/11/23 1021   acetaminophen (TYLENOL) tablet 325 mg, 325 mg, Oral, Q6H PRN, Lorretta Harp, MD   alum & mag hydroxide-simeth (MAALOX/MYLANTA) 200-200-20 MG/5ML suspension 30 mL, 30 mL, Oral, Q4H PRN, Manuela Schwartz, NP, 30 mL at 02/10/23 0327   citalopram (CELEXA) tablet 20 mg, 20 mg, Oral, Daily, Lorretta Harp, MD, 20 mg at 02/11/23 0840   feeding supplement (ENSURE ENLIVE / ENSURE PLUS) liquid 237 mL, 237 mL, Oral, BID BM, Katie Stanley, Katie Dubonnet, MD   folic acid (FOLVITE) tablet 1 mg, 1 mg, Oral, Daily, Katie Stanley, Katie V Bacon, PA-C, 1 mg at 02/11/23 0840   gabapentin (NEURONTIN) capsule 300 mg, 300 mg, Oral, BID, Mal Asher, Katie Dubonnet, MD   hydrALAZINE (APRESOLINE) injection 5 mg, 5 mg, Intravenous, Q2H PRN, Lorretta Harp, MD   LORazepam (ATIVAN) tablet 0-4 mg, 0-4 mg, Oral, Q6H, 2 mg at 02/11/23 0425 **FOLLOWED BY** LORazepam (ATIVAN) tablet 0-4 mg, 0-4 mg, Oral, Q12H, Katie Stanley, Katie V Bacon, PA-C   LORazepam (ATIVAN) tablet 1-4 mg, 1-4 mg, Oral, Q1H PRN, 1 mg at 02/11/23 1158 **OR** LORazepam (ATIVAN) tablet 2 mg, 2 mg, Oral, Q1H PRN, Katie Stanley, Katie V Bacon, PA-C   magnesium oxide (MAG-OX) tablet 400 mg, 400 mg, Oral, BID, Lorretta Harp, MD, 400 mg at 02/11/23 0840   methocarbamol (ROBAXIN) tablet 500 mg, 500 mg, Oral, Q8H PRN, Lorretta Harp, MD   multivitamin with minerals tablet 1 tablet, 1 tablet, Oral, Daily, Katie Stanley, Katie V Bacon, PA-C, 1 tablet at 02/11/23 0840   nicotine (NICODERM CQ - dosed in mg/24 hours) patch 21 mg, 21 mg, Transdermal, Daily, Lorretta Harp,  MD   ondansetron [facility]) injection 4 mg, 4 mg, Intravenous, Q8H PRN, Lorretta Harp, MD, 4 mg at 02/10/23 1813   pantoprazole (PROTONIX) EC tablet 40 mg, 40 mg, Oral, BID AC, Lorretta Harp, MD, 40 mg at 02/11/23 0840   rifaximin (XIFAXAN) tablet 550 mg, 550 mg, Oral, BID, Regalado, Belkys A, MD, 550 mg at 02/11/23 1012   sucralfate (CARAFATE) 1 GM/10ML suspension 1 g, 1 g, Oral, TID WC & HS, Lorretta Harp, MD, 1 g at 02/11/23 1202   thiamine (VITAMIN B1) tablet 100 mg, 100 mg, Oral, Daily, 100 mg at 02/11/23 0840 **OR** thiamine (VITAMIN B1) injection 100 mg, 100 mg, Intravenous, Daily, Katie Stanley, Katie V Bacon, PA-C   vancomycin (VANCOCIN) capsule 125 mg, 125 mg, Oral, QID, Regalado, Belkys A, MD, 125 mg at 02/11/23 0840   Family History  Problem Relation Age of Onset   Hypertension Mother    Diabetes Father    Hypertension Father    Liver cancer Maternal Grandmother    Hypertension Maternal Grandfather    Breast cancer Paternal Grandmother    Heart disease Paternal Grandfather      Social History   Tobacco Use   Smoking status: Every Day    Current packs/day: 1.00    Average packs/day: 1 pack/day for 12.0 years (12.0 ttl pk-yrs)    Types: Cigarettes   Smokeless tobacco: Never  Vaping Use   Vaping status: Never Used  Substance Use Topics   Alcohol use: Yes    Comment: less than a 5th a day.   Drug use: Yes    Types: Marijuana    Comment: 09/28/2021    Allergies as of 02/09/2023 - Review Complete 02/09/2023  Allergen Reaction Noted   Penicillins Swelling 12/19/2014    Review of Systems:    All systems reviewed and negative except where noted in HPI.   Physical Exam:  Vital signs in last 24 hours: Temp:  [97.9 F (36.6 C)-99.3 F (37.4 C)] 98.4 F (36.9 C) (11/03 0754) Pulse Rate:  [93-103] 96 (11/03 0754) Resp:  [13-22] 16 (11/03 0754) BP: (109-127)/(70-77) 127/77 (11/03 0754) SpO2:  [92 %-100 %] 93 % (11/03 0754) Weight:  [87.3 kg] 87.3 kg (11/02 2100) Last BM Date :  02/09/23 General:   Pleasant, cooperative in NAD Head:  Normocephalic and atraumatic. Eyes: Positive icterus.   Conjunctiva pink. PERRLA. Ears:  Normal auditory acuity. Neck:  Supple; no masses or thyroidomegaly Lungs: Respirations even and unlabored. Lungs clear to auscultation bilaterally.   No wheezes, crackles, or rhonchi.  Heart:  Regular rate and rhythm;  Without murmur, clicks, rubs or gallops Abdomen:  Soft, nondistended, nontender. Normal bowel sounds. No appreciable masses or hepatomegaly.  No rebound or guarding.  Rectal:  Not performed. Msk:  Symmetrical without gross deformities.  Strength generalized weakness Extremities:  Without edema, cyanosis or clubbing. Neurologic:  Alert and oriented x3;  grossly normal neurologically. Skin:  Intact without significant lesions or rashes. Psych:  Alert and cooperative. Normal affect.  LAB RESULTS:    Latest Ref Rng & Units 02/11/2023    4:05 AM 02/10/2023    7:27 AM 02/09/2023    4:00 PM  CBC  WBC 4.0 - 10.5 K/uL 6.3  6.9  9.3   Hemoglobin 12.0 - 15.0 g/dL 8.2  8.5  9.9   Hematocrit 36.0 - 46.0 % 23.3  23.7  27.5   Platelets 150 - 400 K/uL 245  220  236     BMET    Latest Ref Rng & Units 02/11/2023    4:05 AM 02/10/2023    7:27 AM 02/09/2023    4:00 PM  BMP  Glucose 70 - 99 mg/dL 99  130  865   BUN 6 - 20 mg/dL 20  24  22    Creatinine 0.44 - 1.00 mg/dL 7.84  6.96  2.95   Sodium 135 - 145 mmol/L 139  134  129   Potassium 3.5 - 5.1 mmol/L 3.6  3.7  2.7   Chloride 98 - 111 mmol/L 97  90  81   CO2 22 - 32 mmol/L 32  32  29   Calcium 8.9 - 10.3 mg/dL 8.8  8.2  8.9     LFT    Latest Ref Rng & Units 02/11/2023    4:05 AM 02/09/2023    8:36 PM  02/06/2023    2:26 PM  Hepatic Function  Total Protein 6.5 - 8.1 g/dL 6.2  7.0  6.7   Albumin 3.5 - 5.0 g/dL 2.5  3.0  2.9   AST 15 - 41 U/L 90  118  101   ALT 0 - 44 U/L 32  37  33   Alk Phosphatase 38 - 126 U/L 263  321  317   Total Bilirubin 0.3 - 1.2 mg/dL 2.9  4.0  5.8    Bilirubin, Direct 0.0 - 0.2 mg/dL  1.9       STUDIES: US Abdomen Limited RUQ (LIVER/GB)  Result Date: 02/10/2023 CLINICAL DATA:  Transaminitis EXAM: ULTRASOUND ABDOMEN LIMITED RIGHT UPPER QUADRANT COMPARISON:  CT 10/27/2022. FINDINGS: Gallbladder: Previous cholecystectomy. Common bile duct: Diameter: 4 mm Liver: Echogenic hepatic parenchyma consistent with fatty infiltration. Portal vein is patent on color Doppler imaging with normal direction of blood flow towards the liver. Other: None. IMPRESSION: Fatty liver infiltration. Previous cholecystectomy. No ductal dilatation Electronically Signed   By: Karen Kays M.D.   On: 02/10/2023 16:11   US RENAL  Result Date: 02/10/2023 CLINICAL DATA:  Acute kidney injury EXAM: RENAL / URINARY TRACT ULTRASOUND COMPLETE COMPARISON:  CT 10/27/2018 FINDINGS: Right Kidney: Renal measurements: 11.4 x 4.7 x 5.1 cm = volume: 148 mL. Echogenicity within normal limits. No mass or hydronephrosis visualized. Previous tiny stones are not as well appreciated today. Left Kidney: Renal measurements: 12.1 x 5.5 x 5.1 cm = volume: 174 mL. Echogenicity within normal limits. No mass or hydronephrosis visualized. Tiny stones on the prior CT are not well seen on this ultrasound. Bladder: Appears normal for degree of bladder distention. Other: None. IMPRESSION: No collecting system dilatation. Previous tiny stones are not well seen on this ultrasound. Electronically Signed   By: Karen Kays M.D.   On: 02/10/2023 12:48      Impression / Plan:   Katie Stanley is a 37 y.o. female with history of alcohol use, alcoholic hepatitis, hep C, HCV not detected is admitted with acute on chronic alcoholic hepatitis, electrolyte abnormalities, AKI  Acute alcoholic hepatitis No evidence of cirrhosis or portal hypertension Ultrasound revealed severe hepatic steatosis as well No evidence of acute liver failure Unable to calculate Madrey's discriminant function score due to unavailability  of PT/INR results Discussed in length regarding abstinence from alcohol use and progression of alcoholic hepatitis if she continues to drink alcohol leading to liver failure Patient reports having alcohol withdrawal symptoms if she tries to quit drinking. Recommend to start gabapentin 300 mg daily, gradually increase the dose every 5 days by 300 mg to the maximum dose of 600 mg 2-3 times daily to treat alcohol dependence Encourage high-protein diet Check hepatitis A and B serologies Continue multivitamin, thiamine and folic acid daily CIWA protocol to prevent alcohol withdrawal Follow-up with Dr. Servando Snare as outpatient  ?  Pancreas mass on CT scan: MRI abdomen with and without contrast has been ordered  ?  C. Difficile diarrhea C. difficile antigen is positive but C. difficile toxin and toxigenic C. difficile PCR both are negative Patient does not meet the criteria for active C. difficile infection Okay to stop oral vancomycin   Thank you for involving me in the care of this patient.  Dr. Servando Snare will cover from tomorrow    LOS: 1 day   Lannette Donath, MD  02/11/2023, 1:34 PM    Note: This dictation was prepared with Dragon dictation along with smaller phrase technology. Any transcriptional  errors that result from this process are unintentional.

## 2023-02-10 NOTE — ED Notes (Signed)
[place] tech called, requests to keep pt NPO except for meds and water until 3p when she can perform [place] of ABD

## 2023-02-10 NOTE — ED Notes (Signed)
US at bedside

## 2023-02-10 NOTE — ED Notes (Signed)
Pt had liquid diarrhea with some urine in BSC. Bladder scan after void showed less than 5 mL

## 2023-02-10 NOTE — ED Notes (Signed)
ED TO INPATIENT HANDOFF REPORT  ED Nurse Name and Phone #: Thurmon Fair. Manson Passey [phone]  S Name/Age/Gender Katie Stanley 37 y.o. female Room/Bed: ED36A/ED36A  Code Status   Code Status: Full Code  Home/SNF/Other Home Patient oriented to: self, place, time, and situation Is this baseline? Yes   Triage Complete: Triage complete  Chief Complaint AKI (acute kidney injury) [facility]) [N17.9]  Triage Note Pt went to PCP for physical and labs. Was called today to come to ER for fluids due to abnormal kidney function and potassium. Pt states she feels weak and legs are cramping.   Allergies Allergies  Allergen Reactions   Penicillins Swelling    Level of Care/Admitting Diagnosis ED Disposition     ED Disposition  Admit   Condition  --   Comment  Hospital Area: [facility] [[id number]]  Level of Care: Telemetry Medical [[id number]]  Covid Evaluation: Asymptomatic - no recent exposure (last 10 days) testing not required  Diagnosis: AKI (acute kidney injury) [facility]) [[id number]]  Admitting Physician: Lorretta Harp [[id number]]  Attending Physician: Alba Cory [phone]  Certification:: I certify this patient will need inpatient services for at least 2 midnights  Expected Medical Readiness: 02/12/2023          B Medical/Surgery History Past Medical History:  Diagnosis Date   Anxiety    Biliary calculi 12/07/2009   Cholelithiasis    Depression    GERD (gastroesophageal reflux disease)    Hepatitis C    body "self healed" per patient   Palpitations    Retained intrauterine contraceptive device (IUD) 01/20/2020   Substance abuse (HCC)    Past Surgical History:  Procedure Laterality Date   CHOLECYSTECTOMY     2011   COLONOSCOPY WITH PROPOFOL N/A 09/20/2022   Procedure: COLONOSCOPY WITH PROPOFOL;  Surgeon: Midge Minium, MD;  Location: [facility] ENDOSCOPY;  Service: Endoscopy;  Laterality: N/A;   ESOPHAGOGASTRODUODENOSCOPY N/A 09/29/2021   Procedure:  ESOPHAGOGASTRODUODENOSCOPY (EGD);  Surgeon: Midge Minium, MD;  Location[facility] ENDOSCOPY;  Service: Endoscopy;  Laterality: N/A;   ESOPHAGOGASTRODUODENOSCOPY (EGD) WITH PROPOFOL N/A 01/21/2018   Procedure: ESOPHAGOGASTRODUODENOSCOPY (EGD) WITH Biopsy;  Surgeon: Midge Minium, MD;  Location: [facility];  Service: Endoscopy;  Laterality: N/A;   HYSTEROSCOPY WITH D & C N/A 01/20/2020   Procedure: DILATATION AND CURETTAGE /HYSTEROSCOPY;  Surgeon: Conard Novak, MD;  Location: [facility] ORS;  Service: Gynecology;  Laterality: N/A;   INTRAUTERINE DEVICE (IUD) INSERTION N/A 01/20/2020   Procedure: INTRAUTERINE DEVICE (IUD) INSERTION MIRENA IUD;  Surgeon: Conard Novak, MD;  Location: [facility] ORS;  Service: Gynecology;  Laterality: N/A;   IUD REMOVAL N/A 01/20/2020   Procedure: INTRAUTERINE DEVICE (IUD) REMOVAL;  Surgeon: Conard Novak, MD;  Location: [facility] ORS;  Service: Gynecology;  Laterality: N/A;   PORT-A-CATH REMOVAL  02/08/2022   PORTA CATH INSERTION N/A 01/01/2019   Procedure: PORTA CATH INSERTION;  Surgeon: Annice Needy, MD;  Location: [facility] INVASIVE CV LAB;  Service: Cardiovascular;  Laterality: N/A;   VASCULAR SURGERY       A IV Location/Drains/Wounds Patient Lines/Drains/Airways Status     Active Line/Drains/Airways     Name Placement date Placement time Site Days   Peripheral IV 02/09/23 22 G 1.75" Anterior;Left Forearm 02/09/23  2027  Forearm  1   Incision - 1 Port Chest Right;Upper 10/15/20  1420  -- 848            Intake/Output Last 24 hours  Intake/Output Summary (Last 24 hours) at 02/10/2023  2008 Last data filed at 02/10/2023 1337 Gross per 24 hour  Intake 400.21 ml  Output --  Net 400.21 ml    Labs/Imaging Results for orders placed or performed during the hospital encounter of 02/09/23 (from the past 48 hour(s))  Basic metabolic panel     Status: Abnormal   Collection Time: 02/09/23  4:00 PM  Result Value Ref Range   Sodium 129 (L) 135 - 145 mmol/L    Potassium 2.7 (LL) 3.5 - 5.1 mmol/L    Comment: CRITICAL RESULT CALLED TO, READ BACK BY AND VERIFIED WITH JACQUE FERGUSON @1636  02/09/23 MJU    Chloride 81 (L) 98 - 111 mmol/L   CO2 29 22 - 32 mmol/L   Glucose, Bld 129 (H) 70 - 99 mg/dL    Comment: Glucose reference range applies only to samples taken after fasting for at least 8 hours.   BUN 22 (H) 6 - 20 mg/dL   Creatinine, Ser 4.69 (H) 0.44 - 1.00 mg/dL   Calcium 8.9 8.9 - 62.9 mg/dL   GFR, Estimated 44 (L) >60 mL/min    Comment: (NOTE) Calculated using the CKD-EPI Creatinine Equation (2021)    Anion gap 19 (H) 5 - 15    Comment: Performed at [facility], [address] [place], [place] [address]  CBC     Status: Abnormal   Collection Time: 02/09/23  4:00 PM  Result Value Ref Range   WBC 9.3 4.0 - 10.5 K/uL   RBC 3.01 (L) 3.87 - 5.11 MIL/uL   Hemoglobin 9.9 (L) 12.0 - 15.0 g/dL   HCT 32.4 (L) 40.1 - 02.7 %   MCV 91.4 80.0 - 100.0 fL   MCH 32.9 26.0 - 34.0 pg   MCHC 36.0 30.0 - 36.0 g/dL   RDW 25.3 66.4 - 40.3 %   Platelets 236 150 - 400 K/uL   nRBC 0.0 0.0 - 0.2 %    Comment: Performed at [facility], [address] [place], [place] [address]  Magnesium     Status: Abnormal   Collection Time: 02/09/23  4:00 PM  Result Value Ref Range   Magnesium 1.6 (L) 1.7 - 2.4 mg/dL    Comment: Performed at [facility], [address] [place], [place] [address]  Urinalysis, Routine w reflex microscopic -Urine, Clean Catch     Status: Abnormal   Collection Time: 02/09/23  7:47 PM  Result Value Ref Range   Color, Urine AMBER (A) YELLOW    Comment: BIOCHEMICALS MAY BE AFFECTED BY COLOR   APPearance HAZY (A) CLEAR   Specific Gravity, Urine 1.015 1.005 - 1.030   pH 5.0 5.0 - 8.0   Glucose, UA NEGATIVE NEGATIVE mg/dL   Hgb urine dipstick NEGATIVE NEGATIVE   Bilirubin Urine NEGATIVE NEGATIVE   Ketones, ur NEGATIVE NEGATIVE mg/dL   Protein, ur NEGATIVE NEGATIVE mg/dL   Nitrite NEGATIVE NEGATIVE    Leukocytes,Ua NEGATIVE NEGATIVE    Comment: Performed at [facility], [address], [place], [place] [address]  Urine Drug Screen, Qualitative     Status: Abnormal   Collection Time: 02/09/23  7:47 PM  Result Value Ref Range   Tricyclic, Ur Screen NONE DETECTED NONE DETECTED   Amphetamines, Ur Screen NONE DETECTED NONE DETECTED   MDMA (Ecstasy)Ur Screen NONE DETECTED NONE DETECTED   Cocaine Metabolite,Ur [place] NONE DETECTED NONE DETECTED   Opiate, Ur Screen POSITIVE (A) NONE DETECTED   Phencyclidine (PCP) Ur S NONE DETECTED NONE DETECTED   Cannabinoid  50 Ng, Ur [place] POSITIVE (A) NONE DETECTED   Barbiturates, Ur Screen NONE DETECTED NONE DETECTED   Benzodiazepine, Ur Scrn NONE DETECTED NONE DETECTED   Methadone Scn, Ur NONE DETECTED NONE DETECTED    Comment: (NOTE) Tricyclics + metabolites, urine    Cutoff 1000 ng/mL Amphetamines + metabolites, urine  Cutoff 1000 ng/mL MDMA (Ecstasy), urine              Cutoff 500 ng/mL Cocaine Metabolite, urine          Cutoff 300 ng/mL Opiate + metabolites, urine        Cutoff 300 ng/mL Phencyclidine (PCP), urine         Cutoff 25 ng/mL Cannabinoid, urine                 Cutoff 50 ng/mL Barbiturates + metabolites, urine  Cutoff 200 ng/mL Benzodiazepine, urine              Cutoff 200 ng/mL Methadone, urine                   Cutoff 300 ng/mL  The urine drug screen provides only a preliminary, unconfirmed analytical test result and should not be used for non-medical purposes. Clinical consideration and professional judgment should be applied to any positive drug screen result due to possible interfering substances. A more specific alternate chemical method must be used in order to obtain a confirmed analytical result. Gas chromatography / mass spectrometry (GC/MS) is the preferred confirm atory method. Performed at [facility], [address] [place], [place] [address]   Phosphorus     Status: Abnormal   Collection Time:  02/09/23  8:34 PM  Result Value Ref Range   Phosphorus 1.9 (L) 2.5 - 4.6 mg/dL    Comment: Performed at [facility], [address] [place], [place] [address]  Hepatic function panel     Status: Abnormal   Collection Time: 02/09/23  8:36 PM  Result Value Ref Range   Total Protein 7.0 6.5 - 8.1 g/dL   Albumin 3.0 (L) 3.5 - 5.0 g/dL   AST 829 (H) 15 - 41 U/L   ALT 37 0 - 44 U/L   Alkaline Phosphatase 321 (H) 38 - 126 U/L   Total Bilirubin 4.0 (H) 0.3 - 1.2 mg/dL   Bilirubin, Direct 1.9 (H) 0.0 - 0.2 mg/dL   Indirect Bilirubin 2.1 (H) 0.3 - 0.9 mg/dL    Comment: Performed at [facility], [address], [place], [place] [address]  Troponin I (High Sensitivity)     Status: None   Collection Time: 02/09/23  8:36 PM  Result Value Ref Range   Troponin I (High Sensitivity) 8 <18 ng/L    Comment: (NOTE) Elevated high sensitivity troponin I (hsTnI) values and significant  changes across serial measurements may suggest ACS but many other  chronic and acute conditions are known to elevate hsTnI results.  Refer to the "Links" section for chest pain algorithms and additional  guidance. Performed at [facility], [address] [place], [place] [address]   Lipase, blood     Status: Abnormal   Collection Time: 02/09/23  8:36 PM  Result Value Ref Range   Lipase 55 (H) 11 - 51 U/L    Comment: Performed at [facility], [address] [place], [place] [address]  Ethanol     Status: None   Collection Time: 02/09/23  8:36 PM  Result Value Ref Range   Alcohol,  Ethyl (B) <10 <10 mg/dL    Comment: (NOTE) Lowest detectable limit for serum alcohol is 10 mg/dL.  For medical purposes only. Performed at [facility], [address] [place], [place] [address]   Pregnancy, urine     Status: None   Collection Time: 02/10/23  7:23 AM  Result Value Ref Range   Preg Test, Ur NEGATIVE NEGATIVE    Comment:        THE SENSITIVITY OF  THIS METHODOLOGY IS >25 mIU/mL. Performed at [facility] [address] [place], [place] [address]   Basic metabolic panel     Status: Abnormal   Collection Time: 02/10/23  7:27 AM  Result Value Ref Range   Sodium 134 (L) 135 - 145 mmol/L   Potassium 3.7 3.5 - 5.1 mmol/L   Chloride 90 (L) 98 - 111 mmol/L   CO2 32 22 - 32 mmol/L   Glucose, Bld 110 (H) 70 - 99 mg/dL    Comment: Glucose reference range applies only to samples taken after fasting for at least 8 hours.   BUN 24 (H) 6 - 20 mg/dL   Creatinine, Ser 9.62 (H) 0.44 - 1.00 mg/dL   Calcium 8.2 (L) 8.9 - 10.3 mg/dL   GFR, Estimated 37 (L) >60 mL/min    Comment: (NOTE) Calculated using the CKD-EPI Creatinine Equation (2021)    Anion gap 12 5 - 15    Comment: Performed at [facility], [address] [place], [place] [address]  CBC     Status: Abnormal   Collection Time: 02/10/23  7:27 AM  Result Value Ref Range   WBC 6.9 4.0 - 10.5 K/uL   RBC 2.58 (L) 3.87 - 5.11 MIL/uL   Hemoglobin 8.5 (L) 12.0 - 15.0 g/dL   HCT 13.2 (L) 44.0 - 10.2 %   MCV 91.9 80.0 - 100.0 fL   MCH 32.9 26.0 - 34.0 pg   MCHC 35.9 30.0 - 36.0 g/dL   RDW 72.5 36.6 - 44.0 %   Platelets 220 150 - 400 K/uL   nRBC 0.0 0.0 - 0.2 %    Comment: Performed at [facility], [address], [place], [place] [address]  Magnesium     Status: None   Collection Time: 02/10/23  7:27 AM  Result Value Ref Range   Magnesium 2.3 1.7 - 2.4 mg/dL    Comment: Performed at [facility], [address], [place], [place] [address]  Phosphorus     Status: None   Collection Time: 02/10/23  7:27 AM  Result Value Ref Range   Phosphorus 2.8 2.5 - 4.6 mg/dL    Comment: Performed at [facility], [address] [place], [place] [address]  CBG monitoring, ED     Status: Abnormal   Collection Time: 02/10/23  7:44 AM  Result Value Ref Range   Glucose-Capillary 105 (H) 70 - 99 mg/dL    Comment: Glucose reference range applies  only to samples taken after fasting for at least 8 hours.   Comment 1 Notify RN   C Difficile Quick Screen w PCR reflex     Status: Abnormal   Collection Time: 02/10/23  9:47 AM   Specimen: STOOL  Result Value Ref Range   C Diff antigen POSITIVE (A) NEGATIVE   C Diff toxin NEGATIVE NEGATIVE   C Diff interpretation Results are indeterminate. See PCR results.     Comment: Performed at [facility], [address] [place], [place]  [address]  C. Diff by PCR, Reflexed     Status: None   Collection Time: 02/10/23  9:47 AM  Result Value Ref Range   Toxigenic C. Difficile by PCR NEGATIVE NEGATIVE    Comment: Patient is colonized with non toxigenic C. difficile. May not need treatment unless significant symptoms are present. Performed at [facility], [address] [place], [place] [address]   Ammonia     Status: Abnormal   Collection Time: 02/10/23  2:16 PM  Result Value Ref Range   Ammonia 58 (H) 9 - 35 umol/L    Comment: Performed at [facility], [address] [place], [place] [address]   US Abdomen Limited RUQ (LIVER/GB)  Result Date: 02/10/2023 CLINICAL DATA:  Transaminitis EXAM: ULTRASOUND ABDOMEN LIMITED RIGHT UPPER QUADRANT COMPARISON:  CT 10/27/2022. FINDINGS: Gallbladder: Previous cholecystectomy. Common bile duct: Diameter: 4 mm Liver: Echogenic hepatic parenchyma consistent with fatty infiltration. Portal vein is patent on color Doppler imaging with normal direction of blood flow towards the liver. Other: None. IMPRESSION: Fatty liver infiltration. Previous cholecystectomy. No ductal dilatation Electronically Signed   By: Karen Kays M.D.   On: 02/10/2023 16:11   US RENAL  Result Date: 02/10/2023 CLINICAL DATA:  Acute kidney injury EXAM: RENAL / URINARY TRACT ULTRASOUND COMPLETE COMPARISON:  CT 10/27/2018 FINDINGS: Right Kidney: Renal measurements: 11.4 x 4.7 x 5.1 cm = volume: 148 mL. Echogenicity within normal limits. No mass or hydronephrosis  visualized. Previous tiny stones are not as well appreciated today. Left Kidney: Renal measurements: 12.1 x 5.5 x 5.1 cm = volume: 174 mL. Echogenicity within normal limits. No mass or hydronephrosis visualized. Tiny stones on the prior CT are not well seen on this ultrasound. Bladder: Appears normal for degree of bladder distention. Other: None. IMPRESSION: No collecting system dilatation. Previous tiny stones are not well seen on this ultrasound. Electronically Signed   By: Karen Kays M.D.   On: 02/10/2023 12:48    Pending Labs Unresulted Labs (From admission, onward)     Start     Ordered   02/11/23 0500  Basic metabolic panel  Daily,   R     Question:  Specimen collection method  Answer:  IV Team=IV Team collect   02/10/23 0831   02/11/23 0500  Vitamin B12  (Anemia Panel (PNL))  Tomorrow morning,   R       Question:  Specimen collection method  Answer:  IV Team=IV Team collect   02/10/23 1346   02/11/23 0500  Folate  (Anemia Panel (PNL))  Tomorrow morning,   R       Question:  Specimen collection method  Answer:  IV Team=IV Team collect   02/10/23 1346   02/11/23 0500  Iron and TIBC  (Anemia Panel (PNL))  Tomorrow morning,   R       Question:  Specimen collection method  Answer:  IV Team=IV Team collect   02/10/23 1346   02/11/23 0500  Ferritin  (Anemia Panel (PNL))  Tomorrow morning,   R       Question:  Specimen collection method  Answer:  IV Team=IV Team collect   02/10/23 1346   02/11/23 0500  Reticulocytes  (Anemia Panel (PNL))  Tomorrow morning,   R       Question:  Specimen collection method  Answer:  IV Team=IV Team collect   02/10/23 1346   02/11/23 0500  CBC  Tomorrow morning,   R       Question:  Specimen collection method  Answer:  IV Team=IV Team collect   02/10/23 1353   02/11/23 0500  Comprehensive metabolic panel  Tomorrow morning,   R       Question:  Specimen collection method  Answer:  IV Team=IV Team collect   02/10/23 1353             Vitals/Pain Today's Vitals   02/10/23 1300 02/10/23 1335 02/10/23 1400 02/10/23 2005  BP: 108/71 113/77 109/71 114/70  Pulse: 89 94 (!) 103 99  Resp: 17 13 16  (!) 22  Temp:   97.9 F (36.6 C) 98.7 F (37.1 C)  TempSrc:   Oral Oral  SpO2: 99% 92% 96% 100%  Weight:      Height:      PainSc:        Isolation Precautions Enteric precautions (UV disinfection)  Medications Medications  LORazepam (ATIVAN) tablet 1-4 mg (2 mg Oral Given 02/10/23 1814)    Or  LORazepam (ATIVAN) tablet 2 mg ( Oral See Alternative 02/10/23 1814)  thiamine (VITAMIN B1) tablet 100 mg (100 mg Oral Given 02/10/23 0937)    Or  thiamine (VITAMIN B1) injection 100 mg ( Intravenous See Alternative 02/10/23 0937)  folic acid (FOLVITE) tablet 1 mg (1 mg Oral Given 02/10/23 0937)  multivitamin with minerals tablet 1 tablet (1 tablet Oral Given 02/10/23 0937)  LORazepam (ATIVAN) tablet 0-4 mg ( Oral Not Given 02/10/23 1655)    Followed by  LORazepam (ATIVAN) tablet 0-4 mg (has no administration in time range)  ondansetron (ZOFRAN) injection 4 mg (4 mg Intravenous Given 02/10/23 1813)  hydrALAZINE (APRESOLINE) injection 5 mg (has no administration in time range)  acetaminophen (TYLENOL) tablet 325 mg (has no administration in time range)  nicotine (NICODERM CQ - dosed in mg/24 hours) patch 21 mg (21 mg Transdermal Patient Refused/Not Given 02/10/23 0938)  methocarbamol (ROBAXIN) tablet 500 mg (has no administration in time range)  citalopram (CELEXA) tablet 20 mg (20 mg Oral Given 02/10/23 0937)  pantoprazole (PROTONIX) EC tablet 40 mg (40 mg Oral Given 02/10/23 1814)  sucralfate (CARAFATE) 1 GM/10ML suspension 1 g (1 g Oral Given 02/10/23 1814)  magnesium oxide (MAG-OX) tablet 400 mg (400 mg Oral Given 02/10/23 0937)  alum & mag hydroxide-simeth (MAALOX/MYLANTA) 200-200-20 MG/5ML suspension 30 mL (30 mLs Oral Given 02/10/23 0327)  0.9 %  sodium chloride infusion ( Intravenous Stopped 02/10/23 1336)  vancomycin (VANCOCIN)  capsule 125 mg (125 mg Oral Given 02/10/23 1657)  sodium chloride 0.9 % bolus 1,000 mL (0 mLs Intravenous Stopped 02/10/23 0049)  potassium chloride 10 mEq in 100 mL IVPB (0 mEq Intravenous Stopped 02/09/23 2151)  ondansetron (ZOFRAN) injection 4 mg (4 mg Intravenous Given 02/09/23 2156)  potassium chloride SA (KLOR-CON M) CR tablet 40 mEq (40 mEq Oral Given 02/10/23 0217)  sodium chloride 0.9 % bolus 1,000 mL (0 mLs Intravenous Stopped 02/10/23 0328)  magnesium sulfate IVPB 1 g 100 mL (0 g Intravenous Stopped 02/10/23 0318)    And  magnesium sulfate IVPB 2 g 50 mL (0 g Intravenous Stopped 02/10/23 0114)  chlordiazePOXIDE (LIBRIUM) capsule 25 mg (25 mg Oral Given 02/10/23 0010)  phosphorus (K PHOS NEUTRAL) tablet 500 mg (500 mg Oral Given 02/10/23 0612)    Mobility walks     Focused Assessments Cardiac Assessment Handoff:  Cardiac Rhythm: Normal sinus rhythm No results found for: "CKTOTAL", "CKMB", "CKMBINDEX", "TROPONINI" No results found for: "DDIMER" Does the Patient currently have chest pain? No    R Recommendations: See  Admitting Provider Note  Report given to:   Additional Notes:

## 2023-02-10 NOTE — Progress Notes (Signed)
PHARMACY CONSULT NOTE  Pharmacy Consult for Electrolyte Monitoring and Replacement   Recent Labs: Potassium (mmol/L)  Date Value  02/10/2023 3.7   Magnesium (mg/dL)  Date Value  78/29/5621 2.3   Calcium (mg/dL)  Date Value  30/86/5784 8.2 (L)   Albumin (g/dL)  Date Value  69/62/9528 3.0 (L)  01/06/2016 4.2   Phosphorus (mg/dL)  Date Value  41/32/4401 2.8   Sodium (mmol/L)  Date Value  02/10/2023 134 (L)  01/06/2016 140    Assessment: Pt is 37 yo female presenting to ED from PCP d/t abnormal labs.  Pharmacy consulted to assist with electrolyte monitoring and replacement as indicated.  Goal of Therapy:  Electrolytes within normal limits  Plan:  No replacement currently indicated Will recheck AM labs Pharmacy will continue to follow  Bettey Costa, PharmD Clinical Pharmacist 02/10/2023 8:30 AM

## 2023-02-11 ENCOUNTER — Inpatient Hospital Stay: Payer: Medicaid Other

## 2023-02-11 ENCOUNTER — Encounter: Payer: Self-pay | Admitting: Radiology

## 2023-02-11 DIAGNOSIS — K7010 Alcoholic hepatitis without ascites: Secondary | ICD-10-CM

## 2023-02-11 DIAGNOSIS — N179 Acute kidney failure, unspecified: Secondary | ICD-10-CM | POA: Diagnosis not present

## 2023-02-11 DIAGNOSIS — F1090 Alcohol use, unspecified, uncomplicated: Secondary | ICD-10-CM

## 2023-02-11 LAB — CBC
HCT: 23.3 % — ABNORMAL LOW (ref 36.0–46.0)
Hemoglobin: 8.2 g/dL — ABNORMAL LOW (ref 12.0–15.0)
MCH: 32.7 pg (ref 26.0–34.0)
MCHC: 35.2 g/dL (ref 30.0–36.0)
MCV: 92.8 fL (ref 80.0–100.0)
Platelets: 245 10*3/uL (ref 150–400)
RBC: 2.51 MIL/uL — ABNORMAL LOW (ref 3.87–5.11)
RDW: 15.3 % (ref 11.5–15.5)
WBC: 6.3 10*3/uL (ref 4.0–10.5)
nRBC: 0 % (ref 0.0–0.2)

## 2023-02-11 LAB — FOLATE: Folate: 7.9 ng/mL (ref >5.9)

## 2023-02-11 LAB — COMPREHENSIVE METABOLIC PANEL
ALT: 32 U/L (ref 0–44)
AST: 90 U/L — ABNORMAL HIGH (ref 15–41)
Albumin: 2.5 g/dL — ABNORMAL LOW (ref 3.5–5.0)
Alkaline Phosphatase: 263 U/L — ABNORMAL HIGH (ref 38–126)
Anion gap: 10 (ref 5–15)
BUN: 20 mg/dL (ref 6–20)
CO2: 32 mmol/L (ref 22–32)
Calcium: 8.8 mg/dL — ABNORMAL LOW (ref 8.9–10.3)
Chloride: 97 mmol/L — ABNORMAL LOW (ref 98–111)
Creatinine, Ser: 1.52 mg/dL — ABNORMAL HIGH (ref 0.44–1.00)
GFR, Estimated: 45 mL/min — ABNORMAL LOW (ref >60)
Glucose, Bld: 99 mg/dL (ref 70–99)
Potassium: 3.6 mmol/L (ref 3.5–5.1)
Sodium: 139 mmol/L (ref 135–145)
Total Bilirubin: 2.9 mg/dL — ABNORMAL HIGH (ref 0.3–1.2)
Total Protein: 6.2 g/dL — ABNORMAL LOW (ref 6.5–8.1)

## 2023-02-11 LAB — RETICULOCYTES
Immature Retic Fract: 27.2 % — ABNORMAL HIGH (ref 2.3–15.9)
RBC.: 2.46 MIL/uL — ABNORMAL LOW (ref 3.87–5.11)
Retic Count, Absolute: 52.9 10*3/uL (ref 19.0–186.0)
Retic Ct Pct: 2.2 % (ref 0.4–3.1)

## 2023-02-11 LAB — GLUCOSE, CAPILLARY: Glucose-Capillary: 101 mg/dL — ABNORMAL HIGH (ref 70–99)

## 2023-02-11 LAB — PROTIME-INR
INR: 1.1 (ref 0.8–1.2)
Prothrombin Time: 14.5 s (ref 11.4–15.2)

## 2023-02-11 LAB — IRON AND TIBC
Iron: 50 ug/dL (ref 28–170)
Saturation Ratios: 33 % — ABNORMAL HIGH (ref 10.4–31.8)
TIBC: 154 ug/dL — ABNORMAL LOW (ref 250–450)
UIBC: 104 ug/dL

## 2023-02-11 LAB — FERRITIN: Ferritin: 574 ng/mL — ABNORMAL HIGH (ref 11–307)

## 2023-02-11 LAB — VITAMIN B12: Vitamin B-12: 1817 pg/mL — ABNORMAL HIGH (ref 180–914)

## 2023-02-11 MED ORDER — RIFAXIMIN 550 MG PO TABS
550.0000 mg | ORAL_TABLET | Freq: Two times a day (BID) | ORAL | Status: DC
  Administered 2023-02-11 – 2023-02-12 (×3): 550 mg via ORAL
  Filled 2023-02-11 (×3): qty 1

## 2023-02-11 MED ORDER — GADOBUTROL 1 MMOL/ML IV SOLN
7.5000 mL | Freq: Once | INTRAVENOUS | Status: AC | PRN
Start: 2023-02-11 — End: 2023-02-11
  Administered 2023-02-11: 7.5 mL via INTRAVENOUS

## 2023-02-11 MED ORDER — GABAPENTIN 300 MG PO CAPS
300.0000 mg | ORAL_CAPSULE | Freq: Two times a day (BID) | ORAL | Status: DC
  Administered 2023-02-11 – 2023-02-12 (×3): 300 mg via ORAL
  Filled 2023-02-11 (×3): qty 1

## 2023-02-11 MED ORDER — HYDROCORTISONE ACETATE 25 MG RE SUPP
25.0000 mg | Freq: Two times a day (BID) | RECTAL | Status: DC
  Filled 2023-02-11 (×3): qty 1

## 2023-02-11 MED ORDER — SODIUM CHLORIDE 0.9 % IV SOLN: INTRAVENOUS | Status: DC

## 2023-02-11 MED ORDER — ENSURE ENLIVE PO LIQD: 237.0000 mL | Freq: Two times a day (BID) | ORAL | Status: DC

## 2023-02-11 NOTE — Discharge Instructions (Signed)
Intensive Outpatient Programs   [facility]  [facility] [address]  [address] [place],  [place]  [place], [place] [phone]  [facility]  [facility] (Inpatient and outpatient)    [phone] (Suboxone and Methadone) [address] [phone]  [facility] - Intensive Outpatient [address]  [address] [place], [place] [address] [place], [place]  [phone]  [facility] (Outpatient, Inpatient, [facility] (Groups and Residental) (insurance only) [phone] [place], [place]   [phone]   [facility] (for caregivers and family) [address]  [address] [place], [place]  [place], [place] [phone]  Residential Treatment Programs  [facility](2 years) Residential: 90 days)  [facility] ([facility]  [address] [place], [place]  [place], [place] [phone] or [phone]  [facility] [address] [place], [place]  [place], [place] [phone]  [facility] ([facility]) [address] [place], [place] [address] [place], [place] [phone] Admissions: 8am-3pm M-F  [facility] Program: Residential Program [phone] Days)           [facility]: [facility]  [place], [place]  [place], [place]  [phone] or [phone] (Walk in Hours over the weekend or by referral)  [facility] [address] [place], [place] [address] [phone] (Do virtual or phone assessment, offer transportation within 25 miles, have in patient and Outpatient options)  Mobil Crisis: Therapeutic  Alternatives:[phone] (for crisis response 24 hours a day)    Intensive Outpatient Programs   [facility] [address]                 [address] [place],  [place]                  [place], [place] [phone]     [phone]  [facility] (Inpatient and outpatient)[phone] (Suboxone and Methadone) [address] [phone]  [facility]: [facility] Programs - Intensive Outpatient [address]          [address] [place], [place] [address], [place]  [phone]  [facility] [place] (Outpatient, Inpatient, [facility] (Groups and Residental) (insurance only) [phone]  [place], [place]          [phone]   [facility] (for caregivers and family) [address]      [address] [place]       [place], [place] [phone]

## 2023-02-11 NOTE — Plan of Care (Signed)

## 2023-02-11 NOTE — Plan of Care (Signed)
  Problem: Health Behavior/Discharge Planning: Goal: Ability to manage health-related needs will improve Outcome: Progressing   

## 2023-02-11 NOTE — Progress Notes (Addendum)
PROGRESS NOTE    Katie Stanley  WJX:[id number] DOB: [birth year] DOA: 02/09/2023 PCP: Dorcas Carrow, DO   Brief Narrative: 37 year old with past medical history significant for diet-controlled diabetes, polysubstance abuse, tobacco abuse, alcohol abuse, GERD, depression with anxiety, anemia, hepatitis C, gallstone, obesity presents with legs cramps, weakness and abnormal labs.  She reported generalized weakness, fatigue, poor appetite, decreased oral intakes.  She was evaluated by PCP and was found to have hypokalemia and AKI.  Sent to the ED for further evaluation.  Patient found to have AKI with a creatinine of 1.5, transaminases.  Assessment & Plan:   Active Problems:   AKI (acute kidney injury) (HCC)   Disorder of electrolytes   Hypokalemia   Hypomagnesemia   Hyponatremia   Hypophosphatemia   Normocytic anemia   Diabetes mellitus without complication (HCC)   GERD (gastroesophageal reflux disease)   Polysubstance abuse (HCC)   Alcohol abuse   Tobacco abuse   Depression with anxiety   Obesity (BMI 30-39.9)   HTN (hypertension)   Abnormal liver function   1-AKI in the setting of hypovolemia hemodynamics. -Continue with IV fluids another 24 hours.  -Continue to hold lisinopril -Renal US: No collecting system dilation.  -Strict I and O.  -Cr down to 1.5  Hypokalemia, hypomagnesemia, hypophosphatemia and hyponatremia: -Replaced.  -Continue to monitor.  -Hyponatremia, hypokalemia resolved.   Diarrhea;  C diff.  Antigen positive, Discussed with Dr Allegra Lai, she doesn't think patient has active C diff. Discontinue vancomycin. Monitor.   Transaminase, Hyperbilirubinemia: Concern for cirrhosis.  -Possible Cirrhosis on previous CT abdomen pelvis.  -RUQ US> Fatty liver previous cholecystectomy.  -GI consulted.  -Ammonia level 52, start rifaximin. .   2.5 cm low density lesion head of Pancreas: Inflammation or neoplastic process.  -Will order MRI with contrast, per  radiology ok to use contrast , [place] is using class 2 contrast, which does not caause systemic fibrosis.   Normocytic anemia: anemia of chronic illness.  -Monitor hb.  -anemia panel. B!2 elevated. Folic acid 7.9, iron 50    Diet-controlled diabetes type 2: -SSI  GERD: -PPI BID> , Sucralfate   Polysubstance abuse, alcohol abuse, tobacco abuse -Continue  to monitor on CIWA. -Counseling.   Depression with anxiety: -Continue with Celexa  Obesity: -needs life style modification.   Hypertension: PRN hydralazine.  BP soft, hold metoprolol./  Hold Lisinopril due to AKI.             Estimated body mass index is 34.08 kg/m as calculated from the following:   Height as of this encounter: 5\' 3"  (1.6 m).   Weight as of this encounter: 87.3 kg.   DVT prophylaxis: SCD Code Status: full code Family Communication: care discussed with patient.  Disposition Plan:  Status is: Observation The patient remains OBS appropriate and will d/c before 2 midnights.    Consultants:  GI  Procedures:  [place]  Antimicrobials:    Subjective: She report diarrhea has improved.  Denies abdominal pain. She is eating more. We talk about alcohol cessation.   Objective: Vitals:   02/10/23 2100 02/10/23 2124 02/11/23 0415 02/11/23 0754  BP:  115/72 114/71 127/77  Pulse:  95 93 96  Resp:  20 19 16   Temp:  99.3 F (37.4 C) 99.2 F (37.3 C) 98.4 F (36.9 C)  TempSrc:  Oral Oral   SpO2:  99% 99% 93%  Weight: 87.3 kg     Height: 5\' 3"  (1.6 m)       Intake/Output  Summary (Last 24 hours) at 02/11/2023 1151 Last data filed at 02/11/2023 0500 Gross per 24 hour  Intake 1824.73 ml  Output --  Net 1824.73 ml   Filed Weights   02/09/23 1554 02/10/23 2100  Weight: 83.9 kg 87.3 kg    Examination:  General exam: NAD Respiratory system: CTA Cardiovascular system: S 1, S 2 RRR Gastrointestinal system: BS present, soft, nt, nd Central nervous system: Alert, follows  command Extremities: no edema   Data Reviewed: I have personally reviewed following labs and imaging studies  CBC: Recent Labs  Lab 02/06/23 1426 02/09/23 1600 02/10/23 0727 02/11/23 0405  WBC 9.1 9.3 6.9 6.3  NEUTROABS 6.8  --   --   --   HGB 10.8* 9.9* 8.5* 8.2*  HCT 29.4* 27.5* 23.7* 23.3*  MCV 90.2 91.4 91.9 92.8  PLT 185 236 220 245   Basic Metabolic Panel: Recent Labs  Lab 02/06/23 1426 02/08/23 1412 02/09/23 1600 02/09/23 2034 02/10/23 0727 02/11/23 0405  NA 125* 127* 129*  --  134* 139  K 2.3* 2.9* 2.7*  --  3.7 3.6  CL 75* 78* 81*  --  90* 97*  CO2 30 30 29   --  32 32  GLUCOSE 121* 94 129*  --  110* 99  BUN 23* 25* 22*  --  24* 20  CREATININE 1.72* 2.13* 1.55*  --  1.79* 1.52*  CALCIUM 8.7* 8.6* 8.9  --  8.2* 8.8*  MG  --   --  1.6*  --  2.3  --   PHOS  --   --   --  1.9* 2.8  --    GFR: Estimated Creatinine Clearance: 53.1 mL/min (A) (by C-G formula based on SCr of 1.52 mg/dL (H)). Liver Function Tests: Recent Labs  Lab 02/06/23 1426 02/09/23 2036 02/11/23 0405  AST 101* 118* 90*  ALT 33 37 32  ALKPHOS 317* 321* 263*  BILITOT 5.8* 4.0* 2.9*  PROT 6.7 7.0 6.2*  ALBUMIN 2.9* 3.0* 2.5*   Recent Labs  Lab 02/09/23 2036  LIPASE 55*   Recent Labs  Lab 02/10/23 1416  AMMONIA 58*   Coagulation Profile: No results for input(s): "INR", "PROTIME" in the last 168 hours. Cardiac Enzymes: No results for input(s): "CKTOTAL", "CKMB", "CKMBINDEX", "TROPONINI" in the last 168 hours. BNP (last 3 results) No results for input(s): "PROBNP" in the last 8760 hours. HbA1C: No results for input(s): "HGBA1C" in the last 72 hours. CBG: Recent Labs  Lab 02/10/23 0744 02/11/23 0900  GLUCAP 105* 101*   Lipid Profile: No results for input(s): "CHOL", "HDL", "LDLCALC", "TRIG", "CHOLHDL", "LDLDIRECT" in the last 72 hours. Thyroid Function Tests: No results for input(s): "TSH", "T4TOTAL", "FREET4", "T3FREE", "THYROIDAB" in the last 72 hours. Anemia  Panel: Recent Labs    02/11/23 0405  VITAMINB12 1,817*  FOLATE 7.9  FERRITIN 574*  TIBC 154*  IRON 50  RETICCTPCT 2.2   Sepsis Labs: No results for input(s): "PROCALCITON", "LATICACIDVEN" in the last 168 hours.  Recent Results (from the past 240 hour(s))  Microscopic Examination     Status: None   Collection Time: 02/05/23  3:03 PM   Urine  Result Value Ref Range Status   WBC, UA None seen 0 - 5 /hpf Final   RBC, Urine None seen 0 - 2 /hpf Final   Epithelial Cells (non renal) 0-10 0 - 10 /hpf Final   Bacteria, UA None seen None seen/Few Final  C Difficile Quick Screen w PCR reflex  Status: Abnormal   Collection Time: 02/10/23  9:47 AM   Specimen: STOOL  Result Value Ref Range Status   C Diff antigen POSITIVE (A) NEGATIVE Final   C Diff toxin NEGATIVE NEGATIVE Final   C Diff interpretation Results are indeterminate. See PCR results.  Final    Comment: Performed at [facility], [address] [place], [place] [address]  C. Diff by PCR, Reflexed     Status: None   Collection Time: 02/10/23  9:47 AM  Result Value Ref Range Status   Toxigenic C. Difficile by PCR NEGATIVE NEGATIVE Final    Comment: Patient is colonized with non toxigenic C. difficile. May not need treatment unless significant symptoms are present. Performed at [facility], [address], [place], [place] [address]          Radiology Studies: US Abdomen Limited RUQ (LIVER/GB)  Result Date: 02/10/2023 CLINICAL DATA:  Transaminitis EXAM: ULTRASOUND ABDOMEN LIMITED RIGHT UPPER QUADRANT COMPARISON:  CT 10/27/2022. FINDINGS: Gallbladder: Previous cholecystectomy. Common bile duct: Diameter: 4 mm Liver: Echogenic hepatic parenchyma consistent with fatty infiltration. Portal vein is patent on color Doppler imaging with normal direction of blood flow towards the liver. Other: None. IMPRESSION: Fatty liver infiltration. Previous cholecystectomy. No ductal dilatation Electronically Signed    By: Karen Kays M.D.   On: 02/10/2023 16:11   US RENAL  Result Date: 02/10/2023 CLINICAL DATA:  Acute kidney injury EXAM: RENAL / URINARY TRACT ULTRASOUND COMPLETE COMPARISON:  CT 10/27/2018 FINDINGS: Right Kidney: Renal measurements: 11.4 x 4.7 x 5.1 cm = volume: 148 mL. Echogenicity within normal limits. No mass or hydronephrosis visualized. Previous tiny stones are not as well appreciated today. Left Kidney: Renal measurements: 12.1 x 5.5 x 5.1 cm = volume: 174 mL. Echogenicity within normal limits. No mass or hydronephrosis visualized. Tiny stones on the prior CT are not well seen on this ultrasound. Bladder: Appears normal for degree of bladder distention. Other: None. IMPRESSION: No collecting system dilatation. Previous tiny stones are not well seen on this ultrasound. Electronically Signed   By: Karen Kays M.D.   On: 02/10/2023 12:48        Scheduled Meds:  citalopram  20 mg Oral Daily   folic acid  1 mg Oral Daily   LORazepam  0-4 mg Oral Q6H   Followed by   LORazepam  0-4 mg Oral Q12H   magnesium oxide  400 mg Oral BID   multivitamin with minerals  1 tablet Oral Daily   nicotine  21 mg Transdermal Daily   pantoprazole  40 mg Oral BID AC   rifaximin  550 mg Oral BID   sucralfate  1 g Oral TID WC & HS   thiamine  100 mg Oral Daily   Or   thiamine  100 mg Intravenous Daily   vancomycin  125 mg Oral QID   Continuous Infusions:  sodium chloride 75 mL/hr at 02/11/23 1021     LOS: 1 day    Time spent: 35 minutes    Ario Mcdiarmid A Admir Candelas, MD [facility]   If 7PM-7AM, please contact night-coverage [email or web address]  02/11/2023, 11:51 AM

## 2023-02-12 DIAGNOSIS — N179 Acute kidney failure, unspecified: Secondary | ICD-10-CM | POA: Diagnosis not present

## 2023-02-12 LAB — HEPATIC FUNCTION PANEL
ALT: 30 U/L (ref 0–44)
AST: 87 U/L — ABNORMAL HIGH (ref 15–41)
Albumin: 2.4 g/dL — ABNORMAL LOW (ref 3.5–5.0)
Alkaline Phosphatase: 237 U/L — ABNORMAL HIGH (ref 38–126)
Bilirubin, Direct: 1 mg/dL — ABNORMAL HIGH (ref 0.0–0.2)
Indirect Bilirubin: 1.1 mg/dL — ABNORMAL HIGH (ref 0.3–0.9)
Total Bilirubin: 2.1 mg/dL — ABNORMAL HIGH (ref <1.2)
Total Protein: 5.8 g/dL — ABNORMAL LOW (ref 6.5–8.1)

## 2023-02-12 LAB — BASIC METABOLIC PANEL
Anion gap: 10 (ref 5–15)
BUN: 15 mg/dL (ref 6–20)
CO2: 31 mmol/L (ref 22–32)
Calcium: 8.6 mg/dL — ABNORMAL LOW (ref 8.9–10.3)
Chloride: 101 mmol/L (ref 98–111)
Creatinine, Ser: 1.3 mg/dL — ABNORMAL HIGH (ref 0.44–1.00)
GFR, Estimated: 54 mL/min — ABNORMAL LOW (ref >60)
Glucose, Bld: 101 mg/dL — ABNORMAL HIGH (ref 70–99)
Potassium: 3.9 mmol/L (ref 3.5–5.1)
Sodium: 142 mmol/L (ref 135–145)

## 2023-02-12 LAB — HEPATITIS A ANTIBODY, TOTAL: hep A Total Ab: REACTIVE — AB

## 2023-02-12 LAB — HEPATITIS B CORE ANTIBODY, IGM: Hep B C IgM: NONREACTIVE

## 2023-02-12 LAB — PHOSPHORUS: Phosphorus: 2.8 mg/dL (ref 2.5–4.6)

## 2023-02-12 LAB — GLUCOSE, CAPILLARY: Glucose-Capillary: 100 mg/dL — ABNORMAL HIGH (ref 70–99)

## 2023-02-12 LAB — HEPATITIS B SURFACE ANTIGEN: Hepatitis B Surface Ag: NONREACTIVE

## 2023-02-12 LAB — MAGNESIUM: Magnesium: 1.6 mg/dL — ABNORMAL LOW (ref 1.7–2.4)

## 2023-02-12 LAB — HEPATITIS B CORE ANTIBODY, TOTAL: Hep B Core Total Ab: NONREACTIVE

## 2023-02-12 MED ORDER — FOLIC ACID 1 MG PO TABS: 1.0000 mg | ORAL_TABLET | Freq: Every day | ORAL | 0 refills | Status: DC

## 2023-02-12 MED ORDER — GABAPENTIN 300 MG PO CAPS: 300.0000 mg | ORAL_CAPSULE | Freq: Two times a day (BID) | ORAL | 0 refills | Status: DC

## 2023-02-12 MED ORDER — PANTOPRAZOLE SODIUM 40 MG PO TBEC: 40.0000 mg | DELAYED_RELEASE_TABLET | Freq: Two times a day (BID) | ORAL | 0 refills | Status: DC

## 2023-02-12 MED ORDER — MAGNESIUM OXIDE -MG SUPPLEMENT 400 (240 MG) MG PO TABS: 400.0000 mg | ORAL_TABLET | Freq: Two times a day (BID) | ORAL | 0 refills | Status: DC

## 2023-02-12 MED ORDER — VITAMIN B-1 100 MG PO TABS: 100.0000 mg | ORAL_TABLET | Freq: Every day | ORAL | 0 refills | Status: DC

## 2023-02-12 MED ORDER — MAGNESIUM SULFATE 2 GM/50ML IV SOLN
2.0000 g | Freq: Once | INTRAVENOUS | Status: AC
  Administered 2023-02-12: 2 g via INTRAVENOUS
  Filled 2023-02-12: qty 50

## 2023-02-12 MED ORDER — ADULT MULTIVITAMIN W/MINERALS CH: 1.0000 | ORAL_TABLET | Freq: Every day | ORAL | 0 refills | Status: DC

## 2023-02-12 NOTE — Progress Notes (Signed)
PHARMACY CONSULT NOTE  Pharmacy Consult for Electrolyte Monitoring and Replacement   Recent Labs: Potassium (mmol/L)  Date Value  02/12/2023 3.9   Magnesium (mg/dL)  Date Value  16/01/9603 1.6 (L)   Calcium (mg/dL)  Date Value  54/12/8117 8.6 (L)   Albumin (g/dL)  Date Value  14/78/2956 2.5 (L)  01/06/2016 4.2   Phosphorus (mg/dL)  Date Value  21/30/8657 2.8   Sodium (mmol/L)  Date Value  02/12/2023 142  01/06/2016 140    Assessment: Pt is 37 yo female presenting to ED from PCP d/t abnormal labs.  Pharmacy consulted to assist with electrolyte monitoring and replacement as indicated.  Goal of Therapy:  Electrolytes within normal limits  Plan:  Mag 1.6   MD ordered Magnesium sulfate 2 gm IV x 1 Will recheck AM labs Pharmacy will continue to follow  Angelique Blonder, PharmD Clinical Pharmacist 02/12/2023 9:42 AM

## 2023-02-12 NOTE — Discharge Summary (Signed)
Physician Discharge Summary   Patient: Katie Stanley MRN: [id number] DOB: January 07, 1986  Admit date:     02/09/2023  Discharge date: 02/12/23  Discharge Physician: Alba Cory   PCP: Dorcas Carrow, DO   Recommendations at discharge:    Needs follow up with GI for resolution of hepatitis.  Continue counseling in regards alcohol use.   Discharge Diagnoses: Active Problems:   AKI (acute kidney injury) (HCC)   Disorder of electrolytes   Hypokalemia   Hypomagnesemia   Hyponatremia   Hypophosphatemia   Normocytic anemia   Diabetes mellitus without complication (HCC)   GERD (gastroesophageal reflux disease)   Polysubstance abuse (HCC)   Alcohol abuse   Tobacco abuse   Depression with anxiety   Obesity (BMI 30-39.9)   HTN (hypertension)   Abnormal liver function   Alcoholic hepatitis without ascites   Alcohol use disorder  Resolved Problems:   * No resolved hospital problems. *  Hospital Course: 37 year old with past medical history significant for diet-controlled diabetes, polysubstance abuse, tobacco abuse, alcohol abuse, GERD, depression with anxiety, anemia, hepatitis C, gallstone, obesity presents with legs cramps, weakness and abnormal labs.  She reported generalized weakness, fatigue, poor appetite, decreased oral intakes.  She was evaluated by PCP and was found to have hypokalemia and AKI.  Sent to the ED for further evaluation.   Patient found to have AKI with a creatinine of 1.5, transaminases.    Assessment and Plan:  1-AKI in the setting of hypovolemia hemodynamics. -Continue with IV fluids another 24 hours.  -Continue to hold lisinopril -Renal US: No collecting system dilation.  -Strict I and O.  -Cr down to 1.3   Hypokalemia, hypomagnesemia, hypophosphatemia and hyponatremia: -Replaced.  -Continue to monitor.  -Hyponatremia, hypokalemia resolved.    Diarrhea;  C diff.  Antigen positive, Discussed with Dr Allegra Lai, she doesn't think patient has  active C diff. Discontinue vancomycin. Monitor.  Improved  Transaminase, Hyperbilirubinemia: Hepatitis from alcohol and Hepatitis A>  -Possible Cirrhosis on previous CT abdomen pelvis.  -RUQ US> Fatty liver previous cholecystectomy.  -GI consulted.  -Ammonia level 52, started rifaximin. Wont be cover per GI. Will discontinue at discharge.     2.5 cm low density lesion head of Pancreas: Inflammation or neoplastic process.  -MRI; mass consistent with Pseudocyst. Follow up out patient.    Normocytic anemia: anemia of chronic illness.  -Monitor hb.  -anemia panel. B!2 elevated. Folic acid 7.9, iron 50      Diet-controlled diabetes type 2: -SSI   GERD: -PPI BID> , Sucralfate     Polysubstance abuse, alcohol abuse, tobacco abuse -Continue  to monitor on CIWA. -Counseling.  Started on gabapentin for alcohol cessation.    Depression with anxiety: -Continue with Celexa   Obesity: -needs life style modification.    Hypertension: PRN hydralazine.  Resume metoprolol at discharge.  Hold Lisinopril due to AKI.               Consultants: GI Procedures performed: None Disposition: Home Diet recommendation:  Discharge Diet Orders (From admission, onward)     Start     Ordered   02/12/23 0000  Diet - low sodium heart healthy        02/12/23 0942           Carb modified diet DISCHARGE MEDICATION: Allergies as of 02/12/2023       Reactions   Penicillins Swelling        Medication List     STOP taking these  medications    dicyclomine 10 MG capsule Commonly known as: BENTYL   famotidine 20 MG tablet Commonly known as: PEPCID   lisinopril 10 MG tablet Commonly known as: ZESTRIL   Magnesium 250 MG Tabs Replaced by: magnesium oxide 400 (240 Mg) MG tablet   potassium chloride SA 20 MEQ tablet Commonly known as: KLOR-CON M       TAKE these medications    citalopram 20 MG tablet Commonly known as: CELEXA TAKE 1 TABLET(20 MG) BY MOUTH DAILY    folic acid 1 MG tablet Commonly known as: FOLVITE Take 1 tablet (1 mg total) by mouth daily. Start taking on: February 13, 2023   gabapentin 300 MG capsule Commonly known as: NEURONTIN Take 1 capsule (300 mg total) by mouth 2 (two) times daily.   lidocaine 5 % Commonly known as: Lidoderm Place 1 patch onto the skin daily. Remove & Discard patch within 12 hours or as directed by MD   magnesium oxide 400 (240 Mg) MG tablet Commonly known as: MAG-OX Take 1 tablet (400 mg total) by mouth 2 (two) times daily. Replaces: Magnesium 250 MG Tabs   methocarbamol 500 MG tablet Commonly known as: ROBAXIN Take 1 tablet (500 mg total) by mouth 2 (two) times daily.   metoprolol succinate 25 MG 24 hr tablet Commonly known as: TOPROL-XL Take 1 tablet (25 mg total) by mouth daily.   multivitamin with minerals Tabs tablet Take 1 tablet by mouth daily. Start taking on: February 13, 2023   pantoprazole 40 MG tablet Commonly known as: PROTONIX Take 1 tablet (40 mg total) by mouth 2 (two) times daily before a meal.   sucralfate 1 GM/10ML suspension Commonly known as: Carafate Take 10 mLs (1 g total) by mouth 4 (four) times daily -  with meals and at bedtime.   thiamine 100 MG tablet Commonly known as: Vitamin B-1 Take 1 tablet (100 mg total) by mouth daily. Start taking on: February 13, 2023        Follow-up Information     Olevia Perches P, DO Follow up in 1 week(s).   Specialty: Family Medicine Contact information: [address] [place] [address] [phone]         Midge Minium, MD Follow up in 1 week(s).   Specialty: Gastroenterology Contact information: [address] [place]  [place] [address] [phone]                Discharge Exam: Ceasar Mons Weights   02/09/23 1554 02/10/23 2100  Weight: 83.9 kg 87.3 kg   General; NAD  Condition at discharge: stable  The results of significant diagnostics from this hospitalization (including imaging, microbiology, ancillary  and laboratory) are listed below for reference.   Imaging Studies: MR ABDOMEN W WO CONTRAST  Result Date: 02/12/2023 CLINICAL DATA:  Pancreatic lesion on CT EXAM: MRI ABDOMEN WITHOUT AND WITH CONTRAST TECHNIQUE: Multiplanar multisequence MR imaging of the abdomen was performed both before and after the administration of intravenous contrast. CONTRAST:  7.32mL GADAVIST GADOBUTROL 1 MMOL/ML IV SOLN COMPARISON:  CT abdomen/pelvis dated 10/27/2022 and 06/21/2022. FINDINGS: Lower chest: Lung bases are clear. Hepatobiliary: Severe hepatic steatosis. No suspicious/enhancing hepatic lesions. Status post cholecystectomy. No intrahepatic or extrahepatic ductal dilatation. Pancreas: 15 mm unilocular cystic lesion in the pancreatic head (series 12/image 23), previously 2.5 cm on recent prior CT although new from March 2024. This appearance favors and improving pseudocyst. Spleen:  Within normal limits. Adrenals/Urinary Tract:  Adrenal glands are within normal limits. Kidneys are within  normal limits.  No hydronephrosis. Stomach/Bowel: Stomach is within normal limits. Visualized bowel is unremarkable. Vascular/Lymphatic:  No evidence of abdominal aortic aneurysm. No suspicious abdominal lymphadenopathy. Other:  No abdominal ascites. Musculoskeletal: No focal osseous lesions. IMPRESSION: Improving unilocular cystic lesion in the pancreatic head, favoring a pseudocyst. Severe hepatic steatosis. Electronically Signed   By: Charline Bills M.D.   On: 02/12/2023 00:46   MR 3D Recon At Scanner  Result Date: 02/12/2023 CLINICAL DATA:  Pancreatic lesion on CT EXAM: MRI ABDOMEN WITHOUT AND WITH CONTRAST TECHNIQUE: Multiplanar multisequence MR imaging of the abdomen was performed both before and after the administration of intravenous contrast. CONTRAST:  7.34mL GADAVIST GADOBUTROL 1 MMOL/ML IV SOLN COMPARISON:  CT abdomen/pelvis dated 10/27/2022 and 06/21/2022. FINDINGS: Lower chest: Lung bases are clear. Hepatobiliary: Severe  hepatic steatosis. No suspicious/enhancing hepatic lesions. Status post cholecystectomy. No intrahepatic or extrahepatic ductal dilatation. Pancreas: 15 mm unilocular cystic lesion in the pancreatic head (series 12/image 23), previously 2.5 cm on recent prior CT although new from March 2024. This appearance favors and improving pseudocyst. Spleen:  Within normal limits. Adrenals/Urinary Tract:  Adrenal glands are within normal limits. Kidneys are within normal limits.  No hydronephrosis. Stomach/Bowel: Stomach is within normal limits. Visualized bowel is unremarkable. Vascular/Lymphatic:  No evidence of abdominal aortic aneurysm. No suspicious abdominal lymphadenopathy. Other:  No abdominal ascites. Musculoskeletal: No focal osseous lesions. IMPRESSION: Improving unilocular cystic lesion in the pancreatic head, favoring a pseudocyst. Severe hepatic steatosis. Electronically Signed   By: Charline Bills M.D.   On: 02/12/2023 00:46   US Abdomen Limited RUQ (LIVER/GB)  Result Date: 02/10/2023 CLINICAL DATA:  Transaminitis EXAM: ULTRASOUND ABDOMEN LIMITED RIGHT UPPER QUADRANT COMPARISON:  CT 10/27/2022. FINDINGS: Gallbladder: Previous cholecystectomy. Common bile duct: Diameter: 4 mm Liver: Echogenic hepatic parenchyma consistent with fatty infiltration. Portal vein is patent on color Doppler imaging with normal direction of blood flow towards the liver. Other: None. IMPRESSION: Fatty liver infiltration. Previous cholecystectomy. No ductal dilatation Electronically Signed   By: Karen Kays M.D.   On: 02/10/2023 16:11   US RENAL  Result Date: 02/10/2023 CLINICAL DATA:  Acute kidney injury EXAM: RENAL / URINARY TRACT ULTRASOUND COMPLETE COMPARISON:  CT 10/27/2018 FINDINGS: Right Kidney: Renal measurements: 11.4 x 4.7 x 5.1 cm = volume: 148 mL. Echogenicity within normal limits. No mass or hydronephrosis visualized. Previous tiny stones are not as well appreciated today. Left Kidney: Renal measurements: 12.1 x  5.5 x 5.1 cm = volume: 174 mL. Echogenicity within normal limits. No mass or hydronephrosis visualized. Tiny stones on the prior CT are not well seen on this ultrasound. Bladder: Appears normal for degree of bladder distention. Other: None. IMPRESSION: No collecting system dilatation. Previous tiny stones are not well seen on this ultrasound. Electronically Signed   By: Karen Kays M.D.   On: 02/10/2023 12:48    Microbiology: Results for orders placed or performed during the hospital encounter of 02/09/23  C Difficile Quick Screen w PCR reflex     Status: Abnormal   Collection Time: 02/10/23  9:47 AM   Specimen: STOOL  Result Value Ref Range Status   C Diff antigen POSITIVE (A) NEGATIVE Final   C Diff toxin NEGATIVE NEGATIVE Final   C Diff interpretation Results are indeterminate. See PCR results.  Final    Comment: Performed at [facility], [address] [place], [place] [address]  C. Diff by PCR, Reflexed     Status: None   Collection Time: 02/10/23  9:47 AM  Result Value Ref Range Status   Toxigenic C. Difficile by PCR NEGATIVE NEGATIVE Final    Comment: Patient is colonized with non toxigenic C. difficile. May not need treatment unless significant symptoms are present. Performed at [facility], [address] [place], [place] [address]     Labs: CBC: Recent Labs  Lab 02/06/23 1426 02/09/23 1600 02/10/23 0727 02/11/23 0405  WBC 9.1 9.3 6.9 6.3  NEUTROABS 6.8  --   --   --   HGB 10.8* 9.9* 8.5* 8.2*  HCT 29.4* 27.5* 23.7* 23.3*  MCV 90.2 91.4 91.9 92.8  PLT 185 236 220 245   Basic Metabolic Panel: Recent Labs  Lab 02/08/23 1412 02/09/23 1600 02/09/23 2034 02/10/23 0727 02/11/23 0405 02/12/23 0511  NA 127* 129*  --  134* 139 142  K 2.9* 2.7*  --  3.7 3.6 3.9  CL 78* 81*  --  90* 97* 101  CO2 30 29  --  32 32 31  GLUCOSE 94 129*  --  110* 99 101*  BUN 25* 22*  --  24* 20 15  CREATININE 2.13* 1.55*  --  1.79* 1.52* 1.30*  CALCIUM 8.6* 8.9   --  8.2* 8.8* 8.6*  MG  --  1.6*  --  2.3  --  1.6*  PHOS  --   --  1.9* 2.8  --  2.8   Liver Function Tests: Recent Labs  Lab 02/06/23 1426 02/09/23 2036 02/11/23 0405  AST 101* 118* 90*  ALT 33 37 32  ALKPHOS 317* 321* 263*  BILITOT 5.8* 4.0* 2.9*  PROT 6.7 7.0 6.2*  ALBUMIN 2.9* 3.0* 2.5*   CBG: Recent Labs  Lab 02/10/23 0744 02/11/23 0900 02/12/23 0729  GLUCAP 105* 101* 100*    Discharge time spent: greater than 30 minutes.  Signed: Alba Cory, MD [facility] 02/12/2023

## 2023-02-12 NOTE — Plan of Care (Signed)
  Problem: Education: Goal: Knowledge of General Education information will improve Description: Including pain rating scale, medication(s)/side effects and non-pharmacologic comfort measures Outcome: Progressing   Problem: Health Behavior/Discharge Planning: Goal: Ability to manage health-related needs will improve Outcome: Progressing   Problem: Clinical Measurements: Goal: Will remain free from infection Outcome: Progressing Goal: Diagnostic test results will improve Outcome: Progressing   Problem: Nutrition: Goal: Adequate nutrition will be maintained Outcome: Progressing   Problem: Coping: Goal: Level of anxiety will decrease Outcome: Progressing   Problem: Safety: Goal: Ability to remain free from injury will improve Outcome: Progressing   Problem: Skin Integrity: Goal: Risk for impaired skin integrity will decrease Outcome: Progressing

## 2023-02-13 ENCOUNTER — Telehealth: Payer: Self-pay

## 2023-02-13 NOTE — Transitions of Care (Post Inpatient/ED Visit) (Signed)
   02/13/2023  Name: Katie Stanley MRN: [id number] DOB: 02/23/1986  Today's TOC FU Call Status: Today's TOC FU Call Status:: Unsuccessful Call (1st Attempt) Unsuccessful Call (1st Attempt) Date: 02/13/23  Attempted to reach the patient regarding the most recent Inpatient/ED visit. Unable to Leave a message Voice mail is not set up   Follow Up Plan: Additional outreach attempts will be made to reach the patient to complete the Transitions of Care (Post Inpatient/ED visit) call.    Susa Loffler , BSN, RN Care Management Coordinator [employer] [facility] [employer]   [facility] [email or web address] Direct Dial: [phone]

## 2023-02-14 ENCOUNTER — Ambulatory Visit: Payer: Medicaid Other | Attending: Nurse Practitioner | Admitting: Nurse Practitioner

## 2023-02-14 ENCOUNTER — Telehealth: Payer: Self-pay

## 2023-02-14 ENCOUNTER — Telehealth (HOSPITAL_BASED_OUTPATIENT_CLINIC_OR_DEPARTMENT_OTHER): Payer: Self-pay | Admitting: *Deleted

## 2023-02-14 ENCOUNTER — Encounter: Payer: Self-pay | Admitting: Nurse Practitioner

## 2023-02-14 VITALS — BP 110/78 | HR 108 | Ht 63.0 in | Wt 189.2 lb

## 2023-02-14 DIAGNOSIS — F1090 Alcohol use, unspecified, uncomplicated: Secondary | ICD-10-CM

## 2023-02-14 DIAGNOSIS — Z720 Tobacco use: Secondary | ICD-10-CM | POA: Diagnosis not present

## 2023-02-14 DIAGNOSIS — R000 Tachycardia, unspecified: Secondary | ICD-10-CM

## 2023-02-14 DIAGNOSIS — R072 Precordial pain: Secondary | ICD-10-CM

## 2023-02-14 DIAGNOSIS — N179 Acute kidney failure, unspecified: Secondary | ICD-10-CM

## 2023-02-14 DIAGNOSIS — R002 Palpitations: Secondary | ICD-10-CM

## 2023-02-14 NOTE — Patient Instructions (Signed)
Medication Instructions:  No changes *If you need a refill on your cardiac medications before your next appointment, please call your pharmacy*   Lab Work: None ordered If you have labs (blood work) drawn today and your tests are completely normal, you will receive your results only by: MyChart Message (if you have MyChart) OR A paper copy in the mail If you have any lab test that is abnormal or we need to change your treatment, we will call you to review the results.   Testing/Procedures: Your provider has ordered a Lexiscan Myoview Stress test. This will take place at [facility]. Please report to the [facility] medical mall entrance. The volunteers at the first desk will direct you where to go.  [facility] MYOVIEW  Your provider has ordered a Stress Test with nuclear imaging. The purpose of this test is to evaluate the blood supply to your heart muscle. This procedure is referred to as a "Non-Invasive Stress Test." This is because other than having an IV started in your vein, nothing is inserted or "invades" your body. Cardiac stress tests are done to find areas of poor blood flow to the heart by determining the extent of coronary artery disease (CAD). Some patients exercise on a treadmill, which naturally increases the blood flow to your heart, while others who are unable to walk on a treadmill due to physical limitations will have a pharmacologic/chemical stress agent called Lexiscan . This medicine will mimic walking on a treadmill by temporarily increasing your coronary blood flow.   Please note: these test may take anywhere between 2-4 hours to complete  How to prepare for your Myoview test:  Nothing to eat for 6 hours prior to the test No caffeine for 24 hours prior to test No smoking 24 hours prior to test. Your medication may be taken with water.  If your doctor stopped a medication because of this test, do not take that medication. Ladies, please do not wear dresses.  Skirts or pants are  appropriate. Please wear a short sleeve shirt. No perfume, cologne or lotion. Wear comfortable walking shoes. No heels!   PLEASE NOTIFY THE OFFICE AT LEAST 24 HOURS IN ADVANCE IF YOU ARE UNABLE TO KEEP YOUR APPOINTMENT.  [phone] AND  PLEASE NOTIFY NUCLEAR MEDICINE AT [facility] AT LEAST 24 HOURS IN ADVANCE IF YOU ARE UNABLE TO KEEP YOUR APPOINTMENT. [phone]    Follow-Up: At [facility], you and your health needs are our priority.  As part of our continuing mission to provide you with exceptional heart care, we have created designated Provider Care Teams.  These Care Teams include your primary Cardiologist (physician) and Advanced Practice Providers (APPs -  Physician Assistants and Nurse Practitioners) who all work together to provide you with the care you need, when you need it.  We recommend signing up for the patient portal called "MyChart".  Sign up information is provided on this After Visit Summary.  MyChart is used to connect with patients for Virtual Visits (Telemedicine).  Patients are able to view lab/test results, encounter notes, upcoming appointments, etc.  Non-urgent messages can be sent to your provider as well.   To learn more about what you can do with MyChart, go to [email or web address].    Your next appointment:   1 month(s)  Provider:   Nicolasa Ducking, NP

## 2023-02-14 NOTE — Transitions of Care (Post Inpatient/ED Visit) (Signed)
   02/14/2023  Name: Katie Stanley MRN: [id number] DOB: 29-Nov-1985  Today's TOC FU Call Status: Today's TOC FU Call Status:: Unsuccessful Call (2nd Attempt) Unsuccessful Call (1st Attempt) Date: 02/13/23 Unsuccessful Call (2nd Attempt) Date: 02/14/23  Attempted to reach the patient regarding the most recent Inpatient/ED visit.Unable to [facility] Voice mail, VM not set up.Per chart pt has appt today 3:35pm for EKG.  Follow Up Plan: Additional outreach attempts will be made to reach the patient to complete the Transitions of Care (Post Inpatient/ED visit) call.    Susa Loffler , BSN, RN Care Management Coordinator [place]   [facility] [email or web address] Direct Dial: [phone]

## 2023-02-14 NOTE — Progress Notes (Signed)
Office Visit    Patient Name: Lenya Sterne Gaus Date of Encounter: 02/14/2023  Primary Care Provider:  Dorcas Carrow, DO Primary Cardiologist:  Julien Nordmann, MD  Chief Complaint    37 y.o. female with a history of diet-controlled diabetes, polysubstance abuse, tobacco abuse, alcohol abuse, GERD, depression, anxiety, anemia, hepatitis C, gallstones, and obesity, who presents for follow-up related to chest pain and palpitations.  Past Medical History  Subjective   Past Medical History:  Diagnosis Date   Alcohol abuse    Anxiety    Biliary calculi 12/07/2009   Cholelithiasis    Depression    Diet-controlled diabetes mellitus (HCC)    GERD (gastroesophageal reflux disease)    Hepatitis C    body "self healed" per patient   LVH (left ventricular hypertrophy)    a. 10/2022 Echo: EF 60-65%, no rwma, sev conc LVH, nl RV size/fxn, triv MR. Mobile density near ant MV leaflet- felt to be redundant chordae tendinae..   Morbid obesity (HCC)    Palpitations    a. 07/2019 Holter: Sinus rhythm, PVCs, rare PACs.  No significant arrhythmias.   Pancreatic pseudocyst    a. 02/2023 MRI abd: improving unilocular pancreatic pseudocyst.   Retained intrauterine contraceptive device (IUD) 01/20/2020   Substance abuse (HCC)    Past Surgical History:  Procedure Laterality Date   CHOLECYSTECTOMY     2011   COLONOSCOPY WITH PROPOFOL N/A 09/20/2022   Procedure: COLONOSCOPY WITH PROPOFOL;  Surgeon: Midge Minium, MD;  Location: [facility]COPY;  Service: Endoscopy;  Laterality: N/A;   ESOPHAGOGASTRODUODENOSCOPY N/A 09/29/2021   Procedure: ESOPHAGOGASTRODUODENOSCOPY (EGD);  Surgeon: Midge Minium, MD;  Location: [facility] ENDOSCOPY;  Service: Endoscopy;  Laterality: N/A;   ESOPHAGOGASTRODUODENOSCOPY (EGD) WITH PROPOFOL N/A 01/21/2018   Procedure: ESOPHAGOGASTRODUODENOSCOPY (EGD) WITH Biopsy;  Surgeon: Midge Minium, MD;  Location: [facility];  Service: Endoscopy;  Laterality: N/A;   HYSTEROSCOPY  WITH D & C N/A 01/20/2020   Procedure: DILATATION AND CURETTAGE /HYSTEROSCOPY;  Surgeon: Conard Novak, MD;  Location: [facility] ORS;  Service: Gynecology;  Laterality: N/A;   INTRAUTERINE DEVICE (IUD) INSERTION N/A 01/20/2020   Procedure: INTRAUTERINE DEVICE (IUD) INSERTION MIRENA IUD;  Surgeon: Conard Novak, MD;  Location: [facility] ORS;  Service: Gynecology;  Laterality: N/A;   IUD REMOVAL N/A 01/20/2020   Procedure: INTRAUTERINE DEVICE (IUD) REMOVAL;  Surgeon: Conard Novak, MD;  Location: [facility] ORS;  Service: Gynecology;  Laterality: N/A;   PORT-A-CATH REMOVAL  02/08/2022   PORTA CATH INSERTION N/A 01/01/2019   Procedure: PORTA CATH INSERTION;  Surgeon: Annice Needy, MD;  Location: [facility] INVASIVE CV LAB;  Service: Cardiovascular;  Laterality: N/A;   VASCULAR SURGERY      Allergies  Allergies  Allergen Reactions   Penicillins Swelling      History of Present Illness      37 y.o. y/o female with a history of diet-controlled diabetes, polysubstance abuse, tobacco abuse, alcohol abuse, GERD, depression, anxiety, anemia, hepatitis C, gallstones, and obesity.  Ms. Masri initially established care in our office in early 2021 in the setting of palpitations, at which time a Holter monitor showed sinus rhythm with PVCs and rare PACs.  Prior echo in November 2020 showed an EF of 50 to 55%.  She was managed conservatively with beta-blocker as needed.  More recently, she was admitted in July 2024 in the setting of nausea, vomiting, and abdominal pain and alcohol abuse.  An echocardiogram was performed in the setting of mild troponin elevation to  83, which showed normal LV function with mobile density near the anterior mitral valve leaflet.  Our team was consulted and was felt that the finding was explained by a prominent chordae tendinae.  There was no evidence of vegetation and no further cardiac evaluation was warranted.    Ms. Dymek was recently admitted to [facility] regional with weakness,  fatigue, acute kidney injury, and electrolyte abnormalities.  She was treated with IV fluids and electrolytes were supplemented.  She also reported diarrhea which was not felt to be C. difficile.  Home dose of lisinopril was held and she was subsequently discharged home with recommendation to cease alcohol intake.  She has cut back from 1-1/2 40 ounce bottles of whiskey daily to just a half a bottle of whiskey daily.  She has also cut back her smoking for about 1-1/2 packs a day to a half a pack a day.  She continues to experience abdominal discomfort since her hospitalization.  She notes that ever since she was hospitalized in July, she has been experiencing intermittent palpitations, described as elevated heart rates, which she attributes to drinking too much and becoming dehydrated.  She occasionally notes a skipped beat and says that she is aware that she has a history of PVCs.  She has chronic dyspnea on exertion in the setting of limited activity.  She denies PND, orthopnea, syncope, edema, or early satiety. Objective  Home Medications    Current Outpatient Medications  Medication Sig Dispense Refill   citalopram (CELEXA) 20 MG tablet TAKE 1 TABLET(20 MG) BY MOUTH DAILY 90 tablet 1   folic acid (FOLVITE) 1 MG tablet Take 1 tablet (1 mg total) by mouth daily. 30 tablet 0   lidocaine (LIDODERM) 5 % Place 1 patch onto the skin daily. Remove & Discard patch within 12 hours or as directed by MD 30 patch 0   methocarbamol (ROBAXIN) 500 MG tablet Take 1 tablet (500 mg total) by mouth 2 (two) times daily. 20 tablet 0   metoprolol succinate (TOPROL-XL) 25 MG 24 hr tablet Take 1 tablet (25 mg total) by mouth daily. 90 tablet 1   Multiple Vitamin (MULTIVITAMIN WITH MINERALS) TABS tablet Take 1 tablet by mouth daily. 30 tablet 0   pantoprazole (PROTONIX) 40 MG tablet Take 1 tablet (40 mg total) by mouth 2 (two) times daily before a meal. 60 tablet 0   sucralfate (CARAFATE) 1 GM/10ML suspension Take 10 mLs (1  g total) by mouth 4 (four) times daily -  with meals and at bedtime. 420 mL 3   thiamine (VITAMIN B-1) 100 MG tablet Take 1 tablet (100 mg total) by mouth daily. 30 tablet 0   gabapentin (NEURONTIN) 300 MG capsule Take 1 capsule (300 mg total) by mouth 2 (two) times daily. (Patient not taking: Reported on 02/14/2023) 60 capsule 0   magnesium oxide (MAG-OX) 400 (240 Mg) MG tablet Take 1 tablet (400 mg total) by mouth 2 (two) times daily. (Patient not taking: Reported on 02/14/2023) 30 tablet 0   No current facility-administered medications for this visit.     Physical Exam    VS:  BP 110/78 (BP Location: Left Arm, Patient Position: Sitting, Cuff Size: Normal)   Pulse (!) 108   Ht 5\' 3"  (1.6 m)   Wt 189 lb 3.2 oz (85.8 kg)   SpO2 98%   BMI 33.52 kg/m  , BMI Body mass index is 33.52 kg/m.       GEN: Well nourished, well developed, in no acute  distress. HEENT: normal. Neck: Supple, no JVD, carotid bruits, or masses. Cardiac: RRR, no murmurs, rubs, or gallops. No clubbing, cyanosis, edema.  Radials 2+/PT 2+ and equal bilaterally.  Respiratory:  Respirations regular and unlabored, clear to auscultation bilaterally. GI: Soft, nontender, nondistended, BS + x 4. MS: no deformity or atrophy. Skin: warm and dry, no rash. Neuro:  Strength and sensation are intact. Psych: Normal affect.  Accessory Clinical Findings    ECG personally reviewed by me today - EKG Interpretation Date/Time:  Wednesday February 14 2023 16:18:31 EST Ventricular Rate:  103 PR Interval:  144 QRS Duration:  78 QT Interval:  340 QTC Calculation: 445 R Axis:   86  Text Interpretation: Sinus tachycardia Nonspecific T wave abnormality Confirmed by Nicolasa Ducking [phone]) on 02/14/2023 4:31:50 PM  - no acute changes.  Lab Results  Component Value Date   WBC 6.3 02/11/2023   HGB 8.2 (L) 02/11/2023   HCT 23.3 (L) 02/11/2023   MCV 92.8 02/11/2023   PLT 245 02/11/2023   Lab Results  Component Value Date    CREATININE 1.30 (H) 02/12/2023   BUN 15 02/12/2023   NA 142 02/12/2023   K 3.9 02/12/2023   CL 101 02/12/2023   CO2 31 02/12/2023   Lab Results  Component Value Date   ALT 30 02/12/2023   AST 87 (H) 02/12/2023   ALKPHOS 237 (H) 02/12/2023   BILITOT 2.1 (H) 02/12/2023   Lab Results  Component Value Date   CHOL 144 02/06/2023   HDL 37 (L) 02/06/2023   LDLCALC 85 02/06/2023   TRIG 112 02/06/2023   CHOLHDL 3.9 02/06/2023    Lab Results  Component Value Date   HGBA1C 6.1 (H) 02/05/2023   Lab Results  Component Value Date   TSH 2.353 02/06/2023       Assessment & Plan    1.  Precordial chest pain: Patient admitted in July 2024 in the setting of nausea, vomiting, abdominal pain, and alcohol abuse.  She was noted to have mild troponin elevation to 83.  Echo showed normal LV function.  Since then, she has had intermittent precordial chest discomfort, typically at rest, sometimes associated with elevated heart rates.  She also has chronic dyspnea.  Will arrange for a Lexiscan Myoview to rule out ischemia.  Continue beta-blocker therapy.  2.  Palpitations/sinus tachycardia: In the setting of alcoholism, she notes elevated heart rates, which she says historically occurred during periods of dehydration.  She is in sinus tachycardia today to 103.  Prior monitoring did show PVCs, and she also reports occasional skipped beats.  She was monitored on telemetry during recent 7-day hospitalization without any report of significant arrhythmias.  Defer outpatient monitoring at this time.  Continue beta-blocker and encouraged compliance.  3.  Tobacco abuse: Cut back from a pack and a half a day to a half a pack a day.  Complete cessation advised.  She is not sure that she is ready to quit.  4.  Alcohol abuse: Cut back from 1-1/2 40 ounce bottles of whiskey to half of a 40 ounce bottle of whiskey.  Despite being off of alcohol for 7 days while in the hospital, following discharge, she immediately  went back to drinking.  Says she wants to quit and has a prescription for naltrexone, but has yet to fill it.  Strongly encourage cessation and encouraged her to follow-up with her primary care provider closely to assist in this process.  5.  AKI:  noted during recent  hosp.  Creat coming down - 1.3 on 11/4.  Will need outpt repeat @ f/u visit, if not performed sooner.  6.  Disposition: Follow-up Lexiscan Myoview.  Follow-up in clinic in 1 month or sooner if necessary.  Nicolasa Ducking, NP 02/14/2023, 4:34 PM

## 2023-02-15 ENCOUNTER — Telehealth: Payer: Self-pay

## 2023-02-15 NOTE — Transitions of Care (Post Inpatient/ED Visit) (Signed)
   02/15/2023  Name: Katie Stanley MRN: [id number] DOB: [birth year]  Today's TOC FU Call Status: Today's TOC FU Call Status:: Unsuccessful Call (3rd Attempt) Unsuccessful Call (1st Attempt) Date: 02/13/23 Unsuccessful Call (2nd Attempt) Date: 02/14/23 Unsuccessful Call (3rd Attempt) Date: 02/15/23  Attempted  3 separate calls to reach the patient regarding the most recent Inpatient/ED visit.Patient does not have Voice mail set up unable to leave message. She was seen by PCP 02/14/23 for post hospital follow-up.  Follow Up Plan: No further outreach attempts will be made at this time. We have been unable to contact the patient.  Signature Susa Loffler , BSN, RN Care Management Coordinator [name]   [facility] [email or web address] Direct Dial: [phone]

## 2023-02-19 ENCOUNTER — Inpatient Hospital Stay: Payer: Medicaid Other | Admitting: Family Medicine

## 2023-02-20 ENCOUNTER — Encounter: Payer: Self-pay | Admitting: Family Medicine

## 2023-02-20 ENCOUNTER — Ambulatory Visit: Payer: Medicaid Other | Admitting: Family Medicine

## 2023-02-20 VITALS — BP 113/75 | HR 94 | Ht 63.0 in | Wt 191.8 lb

## 2023-02-20 DIAGNOSIS — E876 Hypokalemia: Secondary | ICD-10-CM

## 2023-02-20 DIAGNOSIS — N179 Acute kidney failure, unspecified: Secondary | ICD-10-CM

## 2023-02-20 DIAGNOSIS — F339 Major depressive disorder, recurrent, unspecified: Secondary | ICD-10-CM

## 2023-02-20 DIAGNOSIS — R600 Localized edema: Secondary | ICD-10-CM | POA: Diagnosis not present

## 2023-02-20 DIAGNOSIS — R768 Other specified abnormal immunological findings in serum: Secondary | ICD-10-CM

## 2023-02-20 DIAGNOSIS — F1010 Alcohol abuse, uncomplicated: Secondary | ICD-10-CM

## 2023-02-20 DIAGNOSIS — A0472 Enterocolitis due to Clostridium difficile, not specified as recurrent: Secondary | ICD-10-CM | POA: Diagnosis not present

## 2023-02-20 DIAGNOSIS — R7689 Other specified abnormal immunological findings in serum: Secondary | ICD-10-CM

## 2023-02-20 DIAGNOSIS — K760 Fatty (change of) liver, not elsewhere classified: Secondary | ICD-10-CM

## 2023-02-20 DIAGNOSIS — F1090 Alcohol use, unspecified, uncomplicated: Secondary | ICD-10-CM

## 2023-02-20 MED ORDER — PANTOPRAZOLE SODIUM 40 MG PO TBEC: 40.0000 mg | DELAYED_RELEASE_TABLET | Freq: Two times a day (BID) | ORAL | 0 refills | Status: DC

## 2023-02-20 MED ORDER — CITALOPRAM HYDROBROMIDE 20 MG PO TABS: ORAL_TABLET | ORAL | 1 refills | Status: DC

## 2023-02-20 MED ORDER — VITAMIN B-1 100 MG PO TABS: 100.0000 mg | ORAL_TABLET | Freq: Every day | ORAL | 0 refills | Status: DC

## 2023-02-20 MED ORDER — METOPROLOL SUCCINATE ER 25 MG PO TB24: 25.0000 mg | ORAL_TABLET | Freq: Every day | ORAL | 1 refills | Status: DC

## 2023-02-20 NOTE — Progress Notes (Signed)
BP 113/75   Pulse 94   Ht 5\' 3"  (1.6 m)   Wt 191 lb 12.8 oz (87 kg)   SpO2 97%   BMI 33.98 kg/m    Subjective:    Patient ID: Katie Stanley, female    DOB: 06/19/1985, 37 y.o.   MRN: [id number]  HPI: Katie Stanley is a 37 y.o. female  Chief Complaint  Patient presents with   Hypotension   Hepatitis A    Patient says she was informed that she has Hep A diagnosis. Patient would says she would like to discuss, as she doesn't think that she  had it before hospitalization, and may gotten it from the hospital.    Hospitalization Follow-up   Transition of Care Hospital Follow up.   Hospital/Facility: [facility] D/C Physician: Dr. Sunnie Nielsen D/C Date: 02/12/23  Records Requested: 02/20/23 Records Received: 02/20/23 Records Reviewed: 02/20/23  Diagnoses on Discharge:    AKI (acute kidney injury) (HCC)   Disorder of electrolytes   Hypokalemia   Hypomagnesemia   Hyponatremia   Hypophosphatemia   Normocytic anemia   Diabetes mellitus without complication (HCC)   GERD (gastroesophageal reflux disease)   Polysubstance abuse (HCC)   Alcohol abuse   Tobacco abuse   Depression with anxiety   Obesity (BMI 30-39.9)   HTN (hypertension)   Abnormal liver function   Alcoholic hepatitis without ascites   Alcohol use disorder    Date of interactive Contact within 48 hours of discharge: 02/15/23 Contact was through: phone  Date of 7 day or 14 day face-to-face visit:  02/20/23  within 14 days  Outpatient Encounter Medications as of 02/20/2023  Medication Sig   folic acid (FOLVITE) 1 MG tablet Take 1 tablet (1 mg total) by mouth daily.   lidocaine (LIDODERM) 5 % Place 1 patch onto the skin daily. Remove & Discard patch within 12 hours or as directed by MD   methocarbamol (ROBAXIN) 500 MG tablet Take 1 tablet (500 mg total) by mouth 2 (two) times daily.   Multiple Vitamin (MULTIVITAMIN WITH MINERALS) TABS tablet Take 1 tablet by mouth daily.   sucralfate (CARAFATE) 1 GM/10ML  suspension Take 10 mLs (1 g total) by mouth 4 (four) times daily -  with meals and at bedtime.   [DISCONTINUED] citalopram (CELEXA) 20 MG tablet TAKE 1 TABLET(20 MG) BY MOUTH DAILY   [DISCONTINUED] metoprolol succinate (TOPROL-XL) 25 MG 24 hr tablet Take 1 tablet (25 mg total) by mouth daily.   [DISCONTINUED] pantoprazole (PROTONIX) 40 MG tablet Take 1 tablet (40 mg total) by mouth 2 (two) times daily before a meal.   [DISCONTINUED] thiamine (VITAMIN B-1) 100 MG tablet Take 1 tablet (100 mg total) by mouth daily.   citalopram (CELEXA) 20 MG tablet TAKE 1.5 TABLET(20 MG) BY MOUTH DAILY   metoprolol succinate (TOPROL-XL) 25 MG 24 hr tablet Take 1 tablet (25 mg total) by mouth daily.   pantoprazole (PROTONIX) 40 MG tablet Take 1 tablet (40 mg total) by mouth 2 (two) times daily before a meal.   thiamine (VITAMIN B-1) 100 MG tablet Take 1 tablet (100 mg total) by mouth daily.   [DISCONTINUED] gabapentin (NEURONTIN) 300 MG capsule Take 1 capsule (300 mg total) by mouth 2 (two) times daily. (Patient not taking: Reported on 02/14/2023)   [DISCONTINUED] magnesium oxide (MAG-OX) 400 (240 Mg) MG tablet Take 1 tablet (400 mg total) by mouth 2 (two) times daily. (Patient not taking: Reported on 02/14/2023)   No facility-administered encounter medications on file  as of 02/20/2023.  Per Hospitalist: "Hospital Course: 37 year old with past medical history significant for diet-controlled diabetes, polysubstance abuse, tobacco abuse, alcohol abuse, GERD, depression with anxiety, anemia, hepatitis C, gallstone, obesity presents with legs cramps, weakness and abnormal labs.  She reported generalized weakness, fatigue, poor appetite, decreased oral intakes.  She was evaluated by PCP and was found to have hypokalemia and AKI.  Sent to the ED for further evaluation.   Patient found to have AKI with a creatinine of 1.5, transaminases.  Assessment and Plan:   1-AKI in the setting of hypovolemia hemodynamics. -Continue  with IV fluids another 24 hours.  -Continue to hold lisinopril -Renal US: No collecting system dilation.  -Strict I and O.  -Cr down to 1.3   Hypokalemia, hypomagnesemia, hypophosphatemia and hyponatremia: -Replaced.  -Continue to monitor.  -Hyponatremia, hypokalemia resolved.    Diarrhea;  C diff.  Antigen positive, Discussed with Dr Allegra Lai, she doesn't think patient has active C diff. Discontinue vancomycin. Monitor.  Improved   Transaminase, Hyperbilirubinemia: Hepatitis from alcohol and Hepatitis A>  -Possible Cirrhosis on previous CT abdomen pelvis.  -RUQ US> Fatty liver previous cholecystectomy.  -GI consulted.  -Ammonia level 52, started rifaximin. Wont be cover per GI. Will discontinue at discharge.      2.5 cm low density lesion head of Pancreas: Inflammation or neoplastic process.  -MRI; mass consistent with Pseudocyst. Follow up out patient.    Normocytic anemia: anemia of chronic illness.  -Monitor hb.  -anemia panel. B!2 elevated. Folic acid 7.9, iron 50   Diet-controlled diabetes type 2: -SSI   GERD: -PPI BID> , Sucralfate    Polysubstance abuse, alcohol abuse, tobacco abuse -Continue  to monitor on CIWA. -Counseling.  Started on gabapentin for alcohol cessation.    Depression with anxiety: -Continue with Celexa   Obesity: -needs life style modification.    Hypertension: PRN hydralazine.  Resume metoprolol at discharge.  Hold Lisinopril due to AKI."   Diagnostic Tests Reviewed: CLINICAL DATA:  Acute kidney injury   EXAM: RENAL / URINARY TRACT ULTRASOUND COMPLETE   COMPARISON:  CT 10/27/2018   FINDINGS: Right Kidney:   Renal measurements: 11.4 x 4.7 x 5.1 cm = volume: 148 mL. Echogenicity within normal limits. No mass or hydronephrosis visualized. Previous tiny stones are not as well appreciated today.   Left Kidney:   Renal measurements: 12.1 x 5.5 x 5.1 cm = volume: 174 mL. Echogenicity within normal limits. No mass or  hydronephrosis visualized. Tiny stones on the prior CT are not well seen on this ultrasound.   Bladder:   Appears normal for degree of bladder distention.   Other:   None.   IMPRESSION: No collecting system dilatation. Previous tiny stones are not well seen on this ultrasound.  CLINICAL DATA:  Transaminitis   EXAM: ULTRASOUND ABDOMEN LIMITED RIGHT UPPER QUADRANT   COMPARISON:  CT 10/27/2022.   FINDINGS: Gallbladder:   Previous cholecystectomy.   Common bile duct:   Diameter: 4 mm   Liver:   Echogenic hepatic parenchyma consistent with fatty infiltration. Portal vein is patent on color Doppler imaging with normal direction of blood flow towards the liver.   Other: None.   IMPRESSION: Fatty liver infiltration. Previous cholecystectomy. No ductal Dilatation  Narrative & Impression  CLINICAL DATA:  Pancreatic lesion on CT   EXAM: MRI ABDOMEN WITHOUT AND WITH CONTRAST   TECHNIQUE: Multiplanar multisequence MR imaging of the abdomen was performed both before and after the administration of intravenous contrast.   CONTRAST:  7.40mL GADAVIST GADOBUTROL 1 MMOL/ML IV SOLN   COMPARISON:  CT abdomen/pelvis dated 10/27/2022 and 06/21/2022.   FINDINGS: Lower chest: Lung bases are clear.   Hepatobiliary: Severe hepatic steatosis. No suspicious/enhancing hepatic lesions.   Status post cholecystectomy. No intrahepatic or extrahepatic ductal dilatation.   Pancreas: 15 mm unilocular cystic lesion in the pancreatic head (series 12/image 23), previously 2.5 cm on recent prior CT although new from March 2024. This appearance favors and improving pseudocyst.   Spleen:  Within normal limits.   Adrenals/Urinary Tract:  Adrenal glands are within normal limits.   Kidneys are within normal limits.  No hydronephrosis.   Stomach/Bowel: Stomach is within normal limits.   Visualized bowel is unremarkable.   Vascular/Lymphatic:  No evidence of abdominal aortic  aneurysm.   No suspicious abdominal lymphadenopathy.   Other:  No abdominal ascites.   Musculoskeletal: No focal osseous lesions.   IMPRESSION: Improving unilocular cystic lesion in the pancreatic head, favoring a pseudocyst.   Severe hepatic steatosis.   Disposition: Home  Consults: GI  Discharge Instructions: follow up with GI and here  Disease/illness Education: Discussed today  Home Health/Community Services Discussions/Referrals:  N/A  Establishment or re-establishment of referral orders for community resources: N/A  Discussion with other health care providers: N/A  Assessment and Support of treatment regimen adherence: Fair  Appointments Coordinated with: Patient  Education for self-management, independent living, and ADLs: Discussed today  Since getting out of the hospital, Devaya notes that she is feeling better. Her son was very concerned about her being away and was not doing well at school because of that. She has cut back on her drinking and is hoping she will be able to stop. Her diarrhea has continued  DEPRESSION Mood status: exacerbated Satisfied with current treatment?: no Symptom severity: moderate  Duration of current treatment : N/A Side effects: N/A Medication compliance: N/A Psychotherapy/counseling: no  Previous psychiatric medications: none Depressed mood: yes Anxious mood: yes Anhedonia: no Significant weight loss or gain: no Insomnia: no  Fatigue: yes Feelings of worthlessness or guilt: yes Impaired concentration/indecisiveness: no Suicidal ideations: no Hopelessness: yes Crying spells: no    02/20/2023    2:32 PM 02/09/2023    2:00 PM 02/05/2023    2:45 PM 11/28/2022   10:26 AM 08/17/2022    3:46 PM  Depression screen PHQ 2/9  Decreased Interest 0 1 0 0 0  Down, Depressed, Hopeless 0 0 1 0 0  PHQ - 2 Score 0 1 1 0 0  Altered sleeping 0 3 0 1 0  Tired, decreased energy 1 2 2  0 0  Change in appetite 0 2 3 0 0  Feeling bad or  failure about yourself  1 0 1 0 0  Trouble concentrating 0 0 0 0 0  Moving slowly or fidgety/restless 0 0 1 0 0  Suicidal thoughts 0 0 0 0 0  PHQ-9 Score 2 8 8 1  0  Difficult doing work/chores Not difficult at all Somewhat difficult Very difficult Not difficult at all Not difficult at all    Relevant past medical, surgical, family and social history reviewed and updated as indicated. Interim medical history since our last visit reviewed. Allergies and medications reviewed and updated.  Review of Systems  Constitutional: Negative.   Respiratory: Negative.    Cardiovascular: Negative.   Gastrointestinal:  Positive for diarrhea. Negative for abdominal distention, abdominal pain, anal bleeding, blood in stool, constipation, nausea, rectal pain and vomiting.  Musculoskeletal: Negative.   Skin: Negative.  Neurological: Negative.   Psychiatric/Behavioral:  Positive for dysphoric mood. Negative for agitation, behavioral problems, confusion, decreased concentration, hallucinations, self-injury, sleep disturbance and suicidal ideas. The patient is nervous/anxious. The patient is not hyperactive.     Per HPI unless specifically indicated above     Objective:    BP 113/75   Pulse 94   Ht 5\' 3"  (1.6 m)   Wt 191 lb 12.8 oz (87 kg)   SpO2 97%   BMI 33.98 kg/m   Wt Readings from Last 3 Encounters:  02/20/23 191 lb 12.8 oz (87 kg)  02/14/23 189 lb 3.2 oz (85.8 kg)  02/10/23 192 lb 6.4 oz (87.3 kg)    Physical Exam Vitals and nursing note reviewed.  Constitutional:      General: She is not in acute distress.    Appearance: Normal appearance. She is not ill-appearing, toxic-appearing or diaphoretic.  HENT:     Head: Normocephalic and atraumatic.     Right Ear: External ear normal.     Left Ear: External ear normal.     Nose: Nose normal.     Mouth/Throat:     Mouth: Mucous membranes are moist.     Pharynx: Oropharynx is clear.  Eyes:     General: No scleral icterus.       Right  eye: No discharge.        Left eye: No discharge.     Extraocular Movements: Extraocular movements intact.     Conjunctiva/sclera: Conjunctivae normal.     Pupils: Pupils are equal, round, and reactive to light.  Cardiovascular:     Rate and Rhythm: Normal rate and regular rhythm.     Pulses: Normal pulses.     Heart sounds: Normal heart sounds. No murmur heard.    No friction rub. No gallop.  Pulmonary:     Effort: Pulmonary effort is normal. No respiratory distress.     Breath sounds: Normal breath sounds. No stridor. No wheezing, rhonchi or rales.  Chest:     Chest wall: No tenderness.  Musculoskeletal:        General: Normal range of motion.     Cervical back: Normal range of motion and neck supple.     Right lower leg: Edema present.     Left lower leg: Edema present.  Skin:    General: Skin is warm and dry.     Capillary Refill: Capillary refill takes less than 2 seconds.     Coloration: Skin is not jaundiced or pale.     Findings: No bruising, erythema, lesion or rash.  Neurological:     General: No focal deficit present.     Mental Status: She is alert and oriented to person, place, and time. Mental status is at baseline.  Psychiatric:        Mood and Affect: Mood normal.        Behavior: Behavior normal.        Thought Content: Thought content normal.        Judgment: Judgment normal.     Results for orders placed or performed in visit on 02/20/23  Comprehensive metabolic panel  Result Value Ref Range   Glucose 96 70 - 99 mg/dL   BUN 12 6 - 20 mg/dL   Creatinine, Ser 4.09 (H) 0.57 - 1.00 mg/dL   eGFR 40 (L) >81 XB/JYN/8.29   BUN/Creatinine Ratio 7 (L) 9 - 23   Sodium 140 134 - 144 mmol/L   Potassium 5.0 3.5 - 5.2  mmol/L   Chloride 107 (H) 96 - 106 mmol/L   CO2 16 (L) 20 - 29 mmol/L   Calcium 9.0 8.7 - 10.2 mg/dL   Total Protein 6.6 6.0 - 8.5 g/dL   Albumin 3.4 (L) 3.9 - 4.9 g/dL   Globulin, Total 3.2 1.5 - 4.5 g/dL   Bilirubin Total 1.3 (H) 0.0 - 1.2 mg/dL    Alkaline Phosphatase 356 (H) 44 - 121 IU/L   AST 82 (H) 0 - 40 IU/L   ALT 31 0 - 32 IU/L  CBC with Differential/Platelet  Result Value Ref Range   WBC 8.8 3.4 - 10.8 x10E3/uL   RBC 2.68 (LL) 3.77 - 5.28 x10E6/uL   Hemoglobin 8.9 (L) 11.1 - 15.9 g/dL   Hematocrit 13.0 (L) 86.5 - 46.6 %   MCV 99 (H) 79 - 97 fL   MCH 33.2 (H) 26.6 - 33.0 pg   MCHC 33.5 31.5 - 35.7 g/dL   RDW 78.4 69.6 - 29.5 %   Platelets 446 150 - 450 x10E3/uL   Neutrophils 67 Not Estab. %   Lymphs 24 Not Estab. %   Monocytes 7 Not Estab. %   Eos 1 Not Estab. %   Basos 1 Not Estab. %   Neutrophils Absolute 5.9 1.4 - 7.0 x10E3/uL   Lymphocytes Absolute 2.1 0.7 - 3.1 x10E3/uL   Monocytes Absolute 0.7 0.1 - 0.9 x10E3/uL   EOS (ABSOLUTE) 0.1 0.0 - 0.4 x10E3/uL   Basophils Absolute 0.1 0.0 - 0.2 x10E3/uL   Immature Granulocytes 0 Not Estab. %   Immature Grans (Abs) 0.0 0.0 - 0.1 x10E3/uL  Magnesium  Result Value Ref Range   Magnesium 1.8 1.6 - 2.3 mg/dL      Assessment & Plan:   Problem List Items Addressed This Visit       Digestive   Hepatic steatosis - Primary    Rechecking labs today. Await results. Has appointment scheduled with GI. Await that appointment.       Relevant Orders   Comprehensive metabolic panel (Completed)   CBC with Differential/Platelet (Completed)     Genitourinary   AKI (acute kidney injury) (HCC)    Rechecking labs today. Await results. Increase water intake.       Relevant Orders   Comprehensive metabolic panel (Completed)   CBC with Differential/Platelet (Completed)     Other   Depression, recurrent (HCC)    Not doing well. Will start her on wellbutrin and recheck in 2 weeks. Call with any concerns.       Relevant Medications   citalopram (CELEXA) 20 MG tablet   Other Relevant Orders   AMB Referral [facility] Care Management   Alcohol abuse    Has been cutting down. Has naltrexone. Will start them and start wellbutrin to help with mood and cravings. Recheck in 2 weeks.  Call with any concerns.       Hypokalemia    Rechecking labs today. Await results. Treat as needed.       Hypomagnesemia    Rechecking labs today. Await results. Treat as needed.       Relevant Orders   Comprehensive metabolic panel (Completed)   CBC with Differential/Platelet (Completed)   Magnesium (Completed)   Alcohol use disorder    Has been cutting down. Has naltrexone. Will start them and start wellbutrin to help with mood and cravings. Recheck in 2 weeks. Call with any concerns.       Relevant Orders   AMB Referral [facility]I Care Management  Other Visit Diagnoses     C. difficile diarrhea       Rechecking stool studies today. Has follow up with GI. Call with any concerns.   Relevant Orders   Comprehensive metabolic panel (Completed)   CBC with Differential/Platelet (Completed)   Stool C-Diff Toxin Assay   Peripheral edema       Checking labs today. Await results. Treat as needed.   Relevant Orders   Comprehensive metabolic panel (Completed)   CBC with Differential/Platelet (Completed)   Hepatitis A antibody positive       Discussed with patient that she has been vaccinated for hep A. Reassured patient.        Follow up plan: Return in about 2 weeks (around 03/06/2023).

## 2023-02-21 LAB — COMPREHENSIVE METABOLIC PANEL
ALT: 31 [IU]/L (ref 0–32)
AST: 82 [IU]/L — ABNORMAL HIGH (ref 0–40)
Albumin: 3.4 g/dL — ABNORMAL LOW (ref 3.9–4.9)
Alkaline Phosphatase: 356 [IU]/L — ABNORMAL HIGH (ref 44–121)
BUN/Creatinine Ratio: 7 — ABNORMAL LOW (ref 9–23)
BUN: 12 mg/dL (ref 6–20)
Bilirubin Total: 1.3 mg/dL — ABNORMAL HIGH (ref 0.0–1.2)
CO2: 16 mmol/L — ABNORMAL LOW (ref 20–29)
Calcium: 9 mg/dL (ref 8.7–10.2)
Chloride: 107 mmol/L — ABNORMAL HIGH (ref 96–106)
Creatinine, Ser: 1.69 mg/dL — ABNORMAL HIGH (ref 0.57–1.00)
Globulin, Total: 3.2 g/dL (ref 1.5–4.5)
Glucose: 96 mg/dL (ref 70–99)
Potassium: 5 mmol/L (ref 3.5–5.2)
Sodium: 140 mmol/L (ref 134–144)
Total Protein: 6.6 g/dL (ref 6.0–8.5)
eGFR: 40 mL/min/{1.73_m2} — ABNORMAL LOW (ref >59)

## 2023-02-21 LAB — CBC WITH DIFFERENTIAL/PLATELET
Basophils Absolute: 0.1 10*3/uL (ref 0.0–0.2)
Basos: 1 %
EOS (ABSOLUTE): 0.1 10*3/uL (ref 0.0–0.4)
Eos: 1 %
Hematocrit: 26.6 % — ABNORMAL LOW (ref 34.0–46.6)
Hemoglobin: 8.9 g/dL — ABNORMAL LOW (ref 11.1–15.9)
Immature Grans (Abs): 0 10*3/uL (ref 0.0–0.1)
Immature Granulocytes: 0 %
Lymphocytes Absolute: 2.1 10*3/uL (ref 0.7–3.1)
Lymphs: 24 %
MCH: 33.2 pg — ABNORMAL HIGH (ref 26.6–33.0)
MCHC: 33.5 g/dL (ref 31.5–35.7)
MCV: 99 fL — ABNORMAL HIGH (ref 79–97)
Monocytes Absolute: 0.7 10*3/uL (ref 0.1–0.9)
Monocytes: 7 %
Neutrophils Absolute: 5.9 10*3/uL (ref 1.4–7.0)
Neutrophils: 67 %
Platelets: 446 10*3/uL (ref 150–450)
RBC: 2.68 x10E6/uL — CL (ref 3.77–5.28)
RDW: 14.9 % (ref 11.7–15.4)
WBC: 8.8 10*3/uL (ref 3.4–10.8)

## 2023-02-21 LAB — MAGNESIUM: Magnesium: 1.8 mg/dL (ref 1.6–2.3)

## 2023-02-22 ENCOUNTER — Other Ambulatory Visit: Payer: Self-pay | Admitting: Nurse Practitioner

## 2023-02-22 DIAGNOSIS — Z01810 Encounter for preprocedural cardiovascular examination: Secondary | ICD-10-CM

## 2023-02-23 ENCOUNTER — Other Ambulatory Visit: Payer: Self-pay | Admitting: Licensed Clinical Social Worker

## 2023-02-23 NOTE — Patient Outreach (Signed)
  Medicaid Managed Care   Unsuccessful Attempt Note   02/23/2023 Name: MYRAH MINOTTI MRN: [id number] DOB: Apr 11, 1985  Referred by: Dorcas Carrow, DO Reason for referral : No chief complaint on file.   Third unsuccessful telephone outreach was attempted today. The patient was referred to the case management team for assistance with care management and care coordination. The patient's primary care provider has been notified of our unsuccessful attempts to make or maintain contact with the patient. The care management team is pleased to engage with this patient at any time in the future should he/she be interested in assistance from the care management team.    Follow Up Plan: The Managed Medicaid care management team is available to follow up with the patient after provider conversation with the patient regarding recommendation for care management engagement and subsequent re-referral to the care management team.    Dickie La, BSW, MSW, LCSW Licensed Clinical Social Worker [place]   [email or web address] [email or web address] Direct Dial: [phone]

## 2023-02-23 NOTE — Patient Instructions (Signed)
Katie Stanley ,   The [facility] Managed Care Team is available to provide assistance to you with your healthcare needs at no cost and as a benefit of your [facility] Health plan. We have been unable to reach you on 3 separate attempts. The care management team is available to assist with your healthcare needs at any time. Please do not hesitate to contact me at the number below. .   Thank you,   Dickie La, BSW, MSW, LCSW Licensed Clinical Social Worker [employer]   [email or web address] Direct Dial: [phone]

## 2023-02-25 NOTE — Assessment & Plan Note (Signed)
Not doing well. Will start her on wellbutrin and recheck in 2 weeks. Call with any concerns.

## 2023-02-25 NOTE — Assessment & Plan Note (Signed)
Has been cutting down. Has naltrexone. Will start them and start wellbutrin to help with mood and cravings. Recheck in 2 weeks. Call with any concerns.

## 2023-02-25 NOTE — Assessment & Plan Note (Signed)
Rechecking labs today. Await results. Treat as needed.  °

## 2023-02-25 NOTE — Assessment & Plan Note (Signed)
Rechecking labs today. Await results. Has appointment scheduled with GI. Await that appointment.

## 2023-02-25 NOTE — Assessment & Plan Note (Signed)
Rechecking labs today. Await results. Increase water intake.

## 2023-02-26 ENCOUNTER — Ambulatory Visit: Payer: Medicaid Other | Admitting: Family Medicine

## 2023-02-26 ENCOUNTER — Encounter: Payer: Self-pay | Admitting: Family Medicine

## 2023-02-26 ENCOUNTER — Ambulatory Visit (INDEPENDENT_AMBULATORY_CARE_PROVIDER_SITE_OTHER): Payer: Medicaid Other | Admitting: Family Medicine

## 2023-02-26 VITALS — BP 127/80 | HR 85 | Temp 98.8°F | Resp 16 | Wt 195.2 lb

## 2023-02-26 DIAGNOSIS — L03114 Cellulitis of left upper limb: Secondary | ICD-10-CM

## 2023-02-26 DIAGNOSIS — N179 Acute kidney failure, unspecified: Secondary | ICD-10-CM | POA: Diagnosis not present

## 2023-02-26 DIAGNOSIS — K760 Fatty (change of) liver, not elsewhere classified: Secondary | ICD-10-CM | POA: Diagnosis not present

## 2023-02-26 DIAGNOSIS — D649 Anemia, unspecified: Secondary | ICD-10-CM | POA: Diagnosis not present

## 2023-02-26 MED ORDER — DOXYCYCLINE HYCLATE 100 MG PO TABS: 100.0000 mg | ORAL_TABLET | Freq: Two times a day (BID) | ORAL | 0 refills | Status: DC

## 2023-02-26 NOTE — Assessment & Plan Note (Signed)
Rechecking labs today. Await results. Treat as needed.  °

## 2023-02-26 NOTE — Progress Notes (Signed)
BP 127/80 (BP Location: Right Arm, Patient Position: Sitting, Cuff Size: Normal)   Pulse 85   Temp 98.8 F (37.1 C) (Oral)   Resp 16   Wt 195 lb 3.2 oz (88.5 kg)   LMP  (LMP Unknown)   SpO2 98%   BMI 34.58 kg/m    Subjective:    Patient ID: Katie Stanley, female    DOB: 1985/04/23, 37 y.o.   MRN: [id number]  HPI: Katie Stanley is a 37 y.o. female  Chief Complaint  Patient presents with   Cyst    Left arm pit, minimal draining. Feels swollen and very painful. Started a week and half ago.    ABSCESS Duration: days Location: under L arm Pain:  yes Quality:  sore and aching Severity: moderate Redness:  no Swelling:  yes Warmth:  no Oozing:  no Pus:  no Treatments attempted:warm compresses Past similar infections:  no Past MRSA skin infections:  no History of trauma in area:  yes Fevers:  no Nausea/vomiting:  no  Relevant past medical, surgical, family and social history reviewed and updated as indicated. Interim medical history since our last visit reviewed. Allergies and medications reviewed and updated.  Review of Systems  Constitutional: Negative.   Respiratory: Negative.    Cardiovascular: Negative.   Musculoskeletal: Negative.   Psychiatric/Behavioral: Negative.      Per HPI unless specifically indicated above     Objective:    BP 127/80 (BP Location: Right Arm, Patient Position: Sitting, Cuff Size: Normal)   Pulse 85   Temp 98.8 F (37.1 C) (Oral)   Resp 16   Wt 195 lb 3.2 oz (88.5 kg)   LMP  (LMP Unknown)   SpO2 98%   BMI 34.58 kg/m   Wt Readings from Last 3 Encounters:  02/26/23 195 lb 3.2 oz (88.5 kg)  02/20/23 191 lb 12.8 oz (87 kg)  02/14/23 189 lb 3.2 oz (85.8 kg)    Physical Exam Vitals and nursing note reviewed.  Constitutional:      General: She is not in acute distress.    Appearance: Normal appearance. She is not ill-appearing, toxic-appearing or diaphoretic.  HENT:     Head: Normocephalic and atraumatic.     Right  Ear: External ear normal.     Left Ear: External ear normal.     Nose: Nose normal.     Mouth/Throat:     Mouth: Mucous membranes are moist.     Pharynx: Oropharynx is clear.  Eyes:     General: No scleral icterus.       Right eye: No discharge.        Left eye: No discharge.     Extraocular Movements: Extraocular movements intact.     Conjunctiva/sclera: Conjunctivae normal.     Pupils: Pupils are equal, round, and reactive to light.  Cardiovascular:     Rate and Rhythm: Normal rate and regular rhythm.     Pulses: Normal pulses.     Heart sounds: Normal heart sounds. No murmur heard.    No friction rub. No gallop.  Pulmonary:     Effort: Pulmonary effort is normal. No respiratory distress.     Breath sounds: Normal breath sounds. No stridor. No wheezing, rhonchi or rales.  Chest:     Chest wall: No tenderness.  Musculoskeletal:        General: Normal range of motion.     Cervical back: Normal range of motion and neck supple.  Skin:  General: Skin is warm and dry.     Capillary Refill: Capillary refill takes less than 2 seconds.     Coloration: Skin is not jaundiced or pale.     Findings: No bruising, erythema, lesion or rash.     Comments: 1.5 inch non-fluctant mass under L arm, concern for infected sebaceous cyst  Neurological:     General: No focal deficit present.     Mental Status: She is alert and oriented to person, place, and time. Mental status is at baseline.  Psychiatric:        Mood and Affect: Mood normal.        Behavior: Behavior normal.        Thought Content: Thought content normal.        Judgment: Judgment normal.     Results for orders placed or performed in visit on 02/20/23  Comprehensive metabolic panel  Result Value Ref Range   Glucose 96 70 - 99 mg/dL   BUN 12 6 - 20 mg/dL   Creatinine, Ser 4.09 (H) 0.57 - 1.00 mg/dL   eGFR 40 (L) >81 XB/JYN/8.29   BUN/Creatinine Ratio 7 (L) 9 - 23   Sodium 140 134 - 144 mmol/L   Potassium 5.0 3.5 - 5.2  mmol/L   Chloride 107 (H) 96 - 106 mmol/L   CO2 16 (L) 20 - 29 mmol/L   Calcium 9.0 8.7 - 10.2 mg/dL   Total Protein 6.6 6.0 - 8.5 g/dL   Albumin 3.4 (L) 3.9 - 4.9 g/dL   Globulin, Total 3.2 1.5 - 4.5 g/dL   Bilirubin Total 1.3 (H) 0.0 - 1.2 mg/dL   Alkaline Phosphatase 356 (H) 44 - 121 IU/L   AST 82 (H) 0 - 40 IU/L   ALT 31 0 - 32 IU/L  CBC with Differential/Platelet  Result Value Ref Range   WBC 8.8 3.4 - 10.8 x10E3/uL   RBC 2.68 (LL) 3.77 - 5.28 x10E6/uL   Hemoglobin 8.9 (L) 11.1 - 15.9 g/dL   Hematocrit 56.2 (L) 13.0 - 46.6 %   MCV 99 (H) 79 - 97 fL   MCH 33.2 (H) 26.6 - 33.0 pg   MCHC 33.5 31.5 - 35.7 g/dL   RDW 86.5 78.4 - 69.6 %   Platelets 446 150 - 450 x10E3/uL   Neutrophils 67 Not Estab. %   Lymphs 24 Not Estab. %   Monocytes 7 Not Estab. %   Eos 1 Not Estab. %   Basos 1 Not Estab. %   Neutrophils Absolute 5.9 1.4 - 7.0 x10E3/uL   Lymphocytes Absolute 2.1 0.7 - 3.1 x10E3/uL   Monocytes Absolute 0.7 0.1 - 0.9 x10E3/uL   EOS (ABSOLUTE) 0.1 0.0 - 0.4 x10E3/uL   Basophils Absolute 0.1 0.0 - 0.2 x10E3/uL   Immature Granulocytes 0 Not Estab. %   Immature Grans (Abs) 0.0 0.0 - 0.1 x10E3/uL  Magnesium  Result Value Ref Range   Magnesium 1.8 1.6 - 2.3 mg/dL      Assessment & Plan:   Problem List Items Addressed This Visit       Digestive   Hepatic steatosis    Rechecking labs today. Await results. Treat as needed.       Relevant Orders   Comprehensive metabolic panel     Genitourinary   AKI (acute kidney injury) (HCC)    Rechecking labs today. Await results. Treat as needed.       Relevant Orders   Comprehensive metabolic panel   Other Visit Diagnoses  Cellulitis of left arm    -  Primary   Not fluctuant. Will treat with doxcycline. Call if not getting better or getting worse.   Anemia, unspecified type       Rechecking labs today. Await results. Treat as needed.   Relevant Orders   CBC with Differential/Platelet   Ferritin   Iron Binding Cap  (TIBC)([facility])   B12        Follow up plan: Return As scheduled.

## 2023-02-26 NOTE — Addendum Note (Signed)
Addended by: Dorcas Carrow on: 02/26/2023 05:05 PM   Modules accepted: Orders

## 2023-02-27 ENCOUNTER — Other Ambulatory Visit: Payer: Self-pay | Admitting: Family Medicine

## 2023-02-27 ENCOUNTER — Telehealth: Payer: Self-pay

## 2023-02-27 NOTE — Telephone Encounter (Signed)
..   Medicaid Managed Care   Unsuccessful Outreach Note  02/27/2023 Name: Katie Stanley MRN: [id number] DOB: [birth year]  Referred by: Dorcas Carrow, DO Reason for referral : Appointment (I called the patient today to get her missed phone appt with the MM LCSW rescheduled. I was not able to leave a message.)   A second unsuccessful telephone outreach was attempted today. The patient was referred to the case management team for assistance with care management and care coordination.   Follow Up Plan: A HIPAA compliant phone message was left for the patient providing contact information and requesting a return call.  The care management team will reach out to the patient again over the next 7 days.   Weston Settle Care Guide  [facility] Managed  Care Guide [facility]  [phone]

## 2023-02-28 NOTE — Telephone Encounter (Signed)
Requested medication (s) are due for refill today: historical medication  Requested medication (s) are on the active medication list: yes   Last refill:  02/26/23  Future visit scheduled: yes in 2 weeks   Notes to clinic:  historical medication. Requesting 90 day supply. Do you want to refill Rx?     Requested Prescriptions  Pending Prescriptions Disp Refills   potassium chloride SA (KLOR-CON M) 20 MEQ tablet [Pharmacy Med Name: POTASSIUM CL ER TABLETS] 90 tablet     Sig: TAKE 1 TABLET(20 MEQ) BY MOUTH DAILY     Endocrinology:  Minerals - Potassium Supplementation Failed - 02/27/2023  7:54 AM      Failed - Cr in normal range and within 360 days    Creatinine, Ser  Date Value Ref Range Status  02/20/2023 1.69 (H) 0.57 - 1.00 mg/dL Final         Passed - K in normal range and within 360 days    Potassium  Date Value Ref Range Status  02/20/2023 5.0 3.5 - 5.2 mmol/L Final         Passed - Valid encounter within last 12 months    Recent Outpatient Visits           2 days ago Cellulitis of left arm   [place] [facility] [place], Megan P, DO   1 week ago Hepatic steatosis   [place] [facility] [place], Megan P, DO   2 weeks ago AKI (acute kidney injury) [facility])   [place] [facility] [place], Megan P, DO   3 weeks ago Routine general medical examination at a health care facility   [facility] [place], Megan P, DO   3 months ago Alcohol abuse   [place] [facility] Mecum, Oswaldo Conroy, PA-C       Future Appointments             In 2 weeks Laural Benes, Oralia Rud, DO [place]   In 3 weeks Brion Aliment, Dois Davenport, NP [facility]

## 2023-03-02 ENCOUNTER — Other Ambulatory Visit: Payer: Self-pay | Admitting: Licensed Clinical Social Worker

## 2023-03-02 NOTE — Telephone Encounter (Signed)
Needs labs

## 2023-03-02 NOTE — Patient Outreach (Signed)
  Medicaid Managed Care   Unsuccessful Attempt Note   03/02/2023 Name: Katie Stanley MRN: [id number] DOB: [birth year]  Referred by: Dorcas Carrow, DO Reason for referral : No chief complaint on file.   Third unsuccessful telephone outreach was attempted today. The patient was referred to the case management team for assistance with care management and care coordination. The patient's primary care provider has been notified of our unsuccessful attempts to make or maintain contact with the patient. The care management team is pleased to engage with this patient at any time in the future should he/she be interested in assistance from the care management team.    Follow Up Plan: A HIPAA compliant phone message was left for the patient providing contact information and requesting a return call.   Dickie La, BSW, MSW, LCSW Licensed Clinical Social Worker [employer]   [email or web address] Direct Dial: [phone]

## 2023-03-02 NOTE — Patient Instructions (Signed)
Katie Stanley ,   The [facility] Managed Care Team is available to provide assistance to you with your healthcare needs at no cost and as a benefit of your [facility] plan. We have been unable to reach you on 3 separate attempts. The care management team is available to assist with your healthcare needs at any time. Please do not hesitate to contact me at the number below. .   Thank you,   Dickie La, BSW, MSW, LCSW Licensed Clinical Social Worker [employer]   [email or web address] Direct Dial: [phone]

## 2023-03-05 NOTE — Telephone Encounter (Signed)
Patient scheduled for labs 03/06/2023 @ 2pm. Understands medication can be filled if needed after that.

## 2023-03-05 NOTE — Telephone Encounter (Signed)
This encounter was created in error - please disregard.

## 2023-03-06 ENCOUNTER — Other Ambulatory Visit: Payer: Medicaid Other

## 2023-03-06 ENCOUNTER — Encounter: Admission: RE | Admit: 2023-03-06 | Payer: Medicaid Other | Source: Ambulatory Visit

## 2023-03-06 DIAGNOSIS — N179 Acute kidney failure, unspecified: Secondary | ICD-10-CM | POA: Diagnosis not present

## 2023-03-06 DIAGNOSIS — D649 Anemia, unspecified: Secondary | ICD-10-CM

## 2023-03-06 DIAGNOSIS — A0472 Enterocolitis due to Clostridium difficile, not specified as recurrent: Secondary | ICD-10-CM

## 2023-03-06 DIAGNOSIS — K760 Fatty (change of) liver, not elsewhere classified: Secondary | ICD-10-CM | POA: Diagnosis not present

## 2023-03-06 NOTE — Progress Notes (Unsigned)
Open order.

## 2023-03-06 NOTE — Telephone Encounter (Signed)
Routing to provider for tomorrow as patient has lab appointment today.

## 2023-03-07 ENCOUNTER — Encounter: Payer: Self-pay | Admitting: Family Medicine

## 2023-03-07 ENCOUNTER — Other Ambulatory Visit: Payer: Self-pay | Admitting: Family Medicine

## 2023-03-07 DIAGNOSIS — D649 Anemia, unspecified: Secondary | ICD-10-CM

## 2023-03-07 DIAGNOSIS — N179 Acute kidney failure, unspecified: Secondary | ICD-10-CM

## 2023-03-07 LAB — COMPREHENSIVE METABOLIC PANEL WITH GFR
ALT: 32 [IU]/L (ref 0–32)
AST: 135 [IU]/L — ABNORMAL HIGH (ref 0–40)
Albumin: 3.1 g/dL — ABNORMAL LOW (ref 3.9–4.9)
Alkaline Phosphatase: 383 [IU]/L — ABNORMAL HIGH (ref 44–121)
BUN/Creatinine Ratio: 4 — ABNORMAL LOW (ref 9–23)
BUN: 5 mg/dL — ABNORMAL LOW (ref 6–20)
Bilirubin Total: 1.4 mg/dL — ABNORMAL HIGH (ref 0.0–1.2)
CO2: 17 mmol/L — ABNORMAL LOW (ref 20–29)
Calcium: 8.5 mg/dL — ABNORMAL LOW (ref 8.7–10.2)
Chloride: 107 mmol/L — ABNORMAL HIGH (ref 96–106)
Creatinine, Ser: 1.17 mg/dL — ABNORMAL HIGH (ref 0.57–1.00)
Globulin, Total: 3.4 g/dL (ref 1.5–4.5)
Glucose: 104 mg/dL — ABNORMAL HIGH (ref 70–99)
Potassium: 3.7 mmol/L (ref 3.5–5.2)
Sodium: 145 mmol/L — ABNORMAL HIGH (ref 134–144)
Total Protein: 6.5 g/dL (ref 6.0–8.5)
eGFR: 62 mL/min/{1.73_m2}

## 2023-03-07 LAB — CBC WITH DIFFERENTIAL/PLATELET
Basophils Absolute: 0.1 10*3/uL (ref 0.0–0.2)
Basos: 1 %
EOS (ABSOLUTE): 0.1 10*3/uL (ref 0.0–0.4)
Eos: 1 %
Hematocrit: 27.7 % — ABNORMAL LOW (ref 34.0–46.6)
Hemoglobin: 9.3 g/dL — ABNORMAL LOW (ref 11.1–15.9)
Immature Grans (Abs): 0 10*3/uL (ref 0.0–0.1)
Immature Granulocytes: 0 %
Lymphocytes Absolute: 1.9 10*3/uL (ref 0.7–3.1)
Lymphs: 20 %
MCH: 33.9 pg — ABNORMAL HIGH (ref 26.6–33.0)
MCHC: 33.6 g/dL (ref 31.5–35.7)
MCV: 101 fL — ABNORMAL HIGH (ref 79–97)
Monocytes Absolute: 0.7 10*3/uL (ref 0.1–0.9)
Monocytes: 7 %
Neutrophils Absolute: 7.1 10*3/uL — ABNORMAL HIGH (ref 1.4–7.0)
Neutrophils: 71 %
Platelets: 194 10*3/uL (ref 150–450)
RBC: 2.74 x10E6/uL — CL (ref 3.77–5.28)
RDW: 16.4 % — ABNORMAL HIGH (ref 11.7–15.4)
WBC: 9.8 10*3/uL (ref 3.4–10.8)

## 2023-03-07 LAB — IRON AND TIBC
Iron Saturation: 34 % (ref 15–55)
Iron: 60 ug/dL (ref 27–159)
Total Iron Binding Capacity: 177 ug/dL — ABNORMAL LOW (ref 250–450)
UIBC: 117 ug/dL — ABNORMAL LOW (ref 131–425)

## 2023-03-07 LAB — FERRITIN: Ferritin: 553 ng/mL — ABNORMAL HIGH (ref 15–150)

## 2023-03-07 LAB — VITAMIN B12: Vitamin B-12: 1132 pg/mL (ref 232–1245)

## 2023-03-07 MED ORDER — IRON (FERROUS SULFATE) 325 (65 FE) MG PO TABS: 325.0000 mg | ORAL_TABLET | Freq: Every day | ORAL | 5 refills | Status: DC

## 2023-03-13 ENCOUNTER — Ambulatory Visit: Payer: Medicaid Other | Admitting: Gastroenterology

## 2023-03-13 NOTE — Progress Notes (Unsigned)
Primary Care Physician: Dorcas Carrow, DO  Primary Gastroenterologist:  Dr. Midge Minium  No chief complaint on file.   HPI: Katie Stanley is a 37 y.o. female here after being in the hospital back in November of this year with alcoholic hepatitis.  The patient was seen by Dr. Allegra Lai at that time.  Her labs showed:  Component     Latest Ref Rng 02/11/2023 02/12/2023 02/20/2023 03/06/2023  AST     0 - 40 IU/L 90 (H)  87 (H)  82 (H)  135 (H)   ALT     0 - 32 IU/L 32  30  31  32   Alkaline Phosphatase     44 - 121 IU/L 263 (H)  237 (H)  356 (H)  383 (H)   Total Bilirubin     0.0 - 1.2 mg/dL 2.9 (H)  2.1 (H)  1.3 (H)  1.4 (H)    During that admission the patient was found to have a lesion on the pancreas and a MRI was ordered.  The MRI was consistent with a pseudocyst.  The patient's hepatitis A antibody showed immunity but the patient had a negative hepatitis B surface antibody.  Past Medical History:  Diagnosis Date   Alcohol abuse    Anxiety    Biliary calculi 12/07/2009   Cholelithiasis    Depression    Diet-controlled diabetes mellitus (HCC)    GERD (gastroesophageal reflux disease)    Hepatitis C    body "self healed" per patient   LVH (left ventricular hypertrophy)    a. 10/2022 Echo: EF 60-65%, no rwma, sev conc LVH, nl RV size/fxn, triv MR. Mobile density near ant MV leaflet- felt to be redundant chordae tendinae..   Morbid obesity (HCC)    Palpitations    a. 07/2019 Holter: Sinus rhythm, PVCs, rare PACs.  No significant arrhythmias.   Pancreatic pseudocyst    a. 02/2023 MRI abd: improving unilocular pancreatic pseudocyst.   Retained intrauterine contraceptive device (IUD) 01/20/2020   Substance abuse (HCC)     Current Outpatient Medications  Medication Sig Dispense Refill   citalopram (CELEXA) 20 MG tablet TAKE 1.5 TABLET(20 MG) BY MOUTH DAILY 135 tablet 1   doxycycline (VIBRA-TABS) 100 MG tablet Take 1 tablet (100 mg total) by mouth 2 (two) times daily. 20  tablet 0   folic acid (FOLVITE) 1 MG tablet Take 1 tablet (1 mg total) by mouth daily. 30 tablet 0   Iron, Ferrous Sulfate, 325 (65 Fe) MG TABS Take 325 mg by mouth daily. 30 tablet 5   lidocaine (LIDODERM) 5 % Place 1 patch onto the skin daily. Remove & Discard patch within 12 hours or as directed by MD 30 patch 0   MAGNESIUM PO Take 1 each by mouth in the morning and at bedtime. OTC patient is unsure of the dost     methocarbamol (ROBAXIN) 500 MG tablet Take 1 tablet (500 mg total) by mouth 2 (two) times daily. 20 tablet 0   metoprolol succinate (TOPROL-XL) 25 MG 24 hr tablet Take 1 tablet (25 mg total) by mouth daily. 90 tablet 1   Multiple Vitamin (MULTIVITAMIN WITH MINERALS) TABS tablet Take 1 tablet by mouth daily. 30 tablet 0   pantoprazole (PROTONIX) 40 MG tablet Take 1 tablet (40 mg total) by mouth 2 (two) times daily before a meal. 60 tablet 0   potassium chloride SA (KLOR-CON M) 20 MEQ tablet Take 20 mEq by mouth daily.  sucralfate (CARAFATE) 1 GM/10ML suspension Take 10 mLs (1 g total) by mouth 4 (four) times daily -  with meals and at bedtime. 420 mL 3   thiamine (VITAMIN B-1) 100 MG tablet Take 1 tablet (100 mg total) by mouth daily. (Patient not taking: Reported on 02/26/2023) 30 tablet 0   No current facility-administered medications for this visit.    Allergies as of 03/13/2023 - Review Complete 02/26/2023  Allergen Reaction Noted   Penicillins Swelling 12/19/2014    ROS:  General: Negative for anorexia, weight loss, fever, chills, fatigue, weakness. ENT: Negative for hoarseness, difficulty swallowing , nasal congestion. CV: Negative for chest pain, angina, palpitations, dyspnea on exertion, peripheral edema.  Respiratory: Negative for dyspnea at rest, dyspnea on exertion, cough, sputum, wheezing.  GI: See history of present illness. GU:  Negative for dysuria, hematuria, urinary incontinence, urinary frequency, nocturnal urination.  Endo: Negative for unusual weight  change.    Physical Examination:   LMP  (LMP Unknown)   General: Well-nourished, well-developed in no acute distress.  Eyes: No icterus. Conjunctivae pink. Lungs: Clear to auscultation bilaterally. Non-labored. Heart: Regular rate and rhythm, no murmurs rubs or gallops.  Abdomen: Bowel sounds are normal, nontender, nondistended, no hepatosplenomegaly or masses, no abdominal bruits or hernia , no rebound or guarding.   Extremities: No lower extremity edema. No clubbing or deformities. Neuro: Alert and oriented x 3.  Grossly intact. Skin: Warm and dry, no jaundice.   Psych: Alert and cooperative, normal mood and affect.  Labs:    Imaging Studies: MR ABDOMEN W WO CONTRAST  Result Date: 02/12/2023 CLINICAL DATA:  Pancreatic lesion on CT EXAM: MRI ABDOMEN WITHOUT AND WITH CONTRAST TECHNIQUE: Multiplanar multisequence MR imaging of the abdomen was performed both before and after the administration of intravenous contrast. CONTRAST:  7.46mL GADAVIST GADOBUTROL 1 MMOL/ML IV SOLN COMPARISON:  CT abdomen/pelvis dated 10/27/2022 and 06/21/2022. FINDINGS: Lower chest: Lung bases are clear. Hepatobiliary: Severe hepatic steatosis. No suspicious/enhancing hepatic lesions. Status post cholecystectomy. No intrahepatic or extrahepatic ductal dilatation. Pancreas: 15 mm unilocular cystic lesion in the pancreatic head (series 12/image 23), previously 2.5 cm on recent prior CT although new from March 2024. This appearance favors and improving pseudocyst. Spleen:  Within normal limits. Adrenals/Urinary Tract:  Adrenal glands are within normal limits. Kidneys are within normal limits.  No hydronephrosis. Stomach/Bowel: Stomach is within normal limits. Visualized bowel is unremarkable. Vascular/Lymphatic:  No evidence of abdominal aortic aneurysm. No suspicious abdominal lymphadenopathy. Other:  No abdominal ascites. Musculoskeletal: No focal osseous lesions. IMPRESSION: Improving unilocular cystic lesion in the  pancreatic head, favoring a pseudocyst. Severe hepatic steatosis. Electronically Signed   By: Charline Bills M.D.   On: 02/12/2023 00:46   MR 3D Recon At Scanner  Result Date: 02/12/2023 CLINICAL DATA:  Pancreatic lesion on CT EXAM: MRI ABDOMEN WITHOUT AND WITH CONTRAST TECHNIQUE: Multiplanar multisequence MR imaging of the abdomen was performed both before and after the administration of intravenous contrast. CONTRAST:  7.12mL GADAVIST GADOBUTROL 1 MMOL/ML IV SOLN COMPARISON:  CT abdomen/pelvis dated 10/27/2022 and 06/21/2022. FINDINGS: Lower chest: Lung bases are clear. Hepatobiliary: Severe hepatic steatosis. No suspicious/enhancing hepatic lesions. Status post cholecystectomy. No intrahepatic or extrahepatic ductal dilatation. Pancreas: 15 mm unilocular cystic lesion in the pancreatic head (series 12/image 23), previously 2.5 cm on recent prior CT although new from March 2024. This appearance favors and improving pseudocyst. Spleen:  Within normal limits. Adrenals/Urinary Tract:  Adrenal glands are within normal limits. Kidneys are within normal limits.  No  hydronephrosis. Stomach/Bowel: Stomach is within normal limits. Visualized bowel is unremarkable. Vascular/Lymphatic:  No evidence of abdominal aortic aneurysm. No suspicious abdominal lymphadenopathy. Other:  No abdominal ascites. Musculoskeletal: No focal osseous lesions. IMPRESSION: Improving unilocular cystic lesion in the pancreatic head, favoring a pseudocyst. Severe hepatic steatosis. Electronically Signed   By: Charline Bills M.D.   On: 02/12/2023 00:46    Assessment and Plan:   Katie Stanley is a 37 y.o. y/o female ***     Midge Minium, MD. Clementeen Graham    Note: This dictation was prepared with Dragon dictation along with smaller phrase technology. Any transcriptional errors that result from this process are unintentional.

## 2023-03-16 ENCOUNTER — Ambulatory Visit: Payer: Medicaid Other | Admitting: Family Medicine

## 2023-03-18 ENCOUNTER — Other Ambulatory Visit: Payer: Self-pay | Admitting: Nurse Practitioner

## 2023-03-20 ENCOUNTER — Other Ambulatory Visit: Payer: Self-pay | Admitting: Family Medicine

## 2023-03-20 NOTE — Telephone Encounter (Signed)
Requested medication (s) are due for refill today: -  Requested medication (s) are on the active medication list: historical med list  Last refill:  02/26/23  Future visit scheduled: no  Notes to clinic:  historical provider   Requested Prescriptions  Pending Prescriptions Disp Refills   potassium chloride SA (KLOR-CON M) 20 MEQ tablet [Pharmacy Med Name: POTASSIUM CL ER TABLETS] 22 tablet     Sig: TAKE 1 TABLET(20 MEQ) BY MOUTH DAILY     Endocrinology:  Minerals - Potassium Supplementation Failed - 03/18/2023  3:35 AM      Failed - Cr in normal range and within 360 days    Creatinine, Ser  Date Value Ref Range Status  03/06/2023 1.17 (H) 0.57 - 1.00 mg/dL Final         Passed - K in normal range and within 360 days    Potassium  Date Value Ref Range Status  03/06/2023 3.7 3.5 - 5.2 mmol/L Final         Passed - Valid encounter within last 12 months    Recent Outpatient Visits           3 weeks ago Cellulitis of left arm   [place] [facility] [place], Megan P, DO   4 weeks ago Hepatic steatosis   [place] [facility] [place], Megan P, DO   1 month ago AKI (acute kidney injury) [facility])   [place] [facility] [place], Megan P, DO   1 month ago Routine general medical examination at a health care facility   [facility] [place], Megan P, DO   3 months ago Alcohol abuse   [place] [facility] Mecum, Oswaldo Conroy, PA-C       Future Appointments             Tomorrow Brion Aliment, Dois Davenport, NP [place] [facility]

## 2023-03-21 ENCOUNTER — Ambulatory Visit: Payer: Medicaid Other | Attending: Nurse Practitioner | Admitting: Nurse Practitioner

## 2023-03-21 NOTE — Progress Notes (Unsigned)
Office Visit    Patient Name: Katie Stanley Date of Encounter: 03/21/2023  Primary Care Provider:  Dorcas Carrow, DO Primary Cardiologist:  Julien Nordmann, MD  Chief Complaint    37 y.o. female with a history of diet-controlled diabetes, polysubstance abuse, tobacco abuse, alcohol abuse, GERD, depression, anxiety, anemia, hepatitis C, gallstones, and obesity, who presents for follow-up related to chest pain and palpitations.   Past Medical History  Subjective   Past Medical History:  Diagnosis Date   Alcohol abuse    Anxiety    Biliary calculi 12/07/2009   Cholelithiasis    Depression    Diet-controlled diabetes mellitus (HCC)    GERD (gastroesophageal reflux disease)    Hepatitis C    body "self healed" per patient   LVH (left ventricular hypertrophy)    a. 10/2022 Echo: EF 60-65%, no rwma, sev conc LVH, nl RV size/fxn, triv MR. Mobile density near ant MV leaflet- felt to be redundant chordae tendinae..   Morbid obesity (HCC)    Palpitations    a. 07/2019 Holter: Sinus rhythm, PVCs, rare PACs.  No significant arrhythmias.   Pancreatic pseudocyst    a. 02/2023 MRI abd: improving unilocular pancreatic pseudocyst.   Retained intrauterine contraceptive device (IUD) 01/20/2020   Substance abuse (HCC)    Past Surgical History:  Procedure Laterality Date   CHOLECYSTECTOMY     2011   COLONOSCOPY WITH PROPOFOL N/A 09/20/2022   Procedure: COLONOSCOPY WITH PROPOFOL;  Surgeon: Midge Minium, MD;  Location: [facility] ENDOSCOPY;  Service: Endoscopy;  Laterality: N/A;   ESOPHAGOGASTRODUODENOSCOPY N/A 09/29/2021   Procedure: ESOPHAGOGASTRODUODENOSCOPY (EGD);  Surgeon: Midge Minium, MD;  Location: [facility] ENDOSCOPY;  Service: Endoscopy;  Laterality: N/A;   ESOPHAGOGASTRODUODENOSCOPY (EGD) WITH PROPOFOL N/A 01/21/2018   Procedure: ESOPHAGOGASTRODUODENOSCOPY (EGD) WITH Biopsy;  Surgeon: Midge Minium, MD;  Location: [facility];  Service: Endoscopy;  Laterality: N/A;   HYSTEROSCOPY  WITH D & C N/A 01/20/2020   Procedure: DILATATION AND CURETTAGE /HYSTEROSCOPY;  Surgeon: Conard Novak, MD;  Location: [facility] ORS;  Service: Gynecology;  Laterality: N/A;   INTRAUTERINE DEVICE (IUD) INSERTION N/A 01/20/2020   Procedure: INTRAUTERINE DEVICE (IUD) INSERTION MIRENA IUD;  Surgeon: Conard Novak, MD;  Location: [facility] ORS;  Service: Gynecology;  Laterality: N/A;   IUD REMOVAL N/A 01/20/2020   Procedure: INTRAUTERINE DEVICE (IUD) REMOVAL;  Surgeon: Conard Novak, MD;  Location: [facility] ORS;  Service: Gynecology;  Laterality: N/A;   PORT-A-CATH REMOVAL  02/08/2022   PORTA CATH INSERTION N/A 01/01/2019   Procedure: PORTA CATH INSERTION;  Surgeon: Annice Needy, MD;  Location: [facility] INVASIVE CV LAB;  Service: Cardiovascular;  Laterality: N/A;   VASCULAR SURGERY      Allergies  Allergies  Allergen Reactions   Penicillins Swelling      History of Present Illness      37 y.o. y/o female with a history of diet-controlled diabetes, polysubstance abuse, tobacco abuse, alcohol abuse, GERD, depression, anxiety, anemia, hepatitis C, gallstones, and obesity.  Katie Stanley initially established care in our office in early 2021 in the setting of palpitations, at which time a Holter monitor showed sinus rhythm with PVCs and rare PACs.  Prior echo in November 2020 showed an EF of 50 to 55%.  She was managed conservatively with beta-blocker as needed.  More recently, she was admitted in July 2024 in the setting of nausea, vomiting, and abdominal pain and alcohol abuse.  An echocardiogram was performed in the setting of mild troponin elevation  to 83, which showed normal LV function with mobile density near the anterior mitral valve leaflet.  Our team was consulted and was felt that the finding was explained by a prominent chordae tendinae.  There was no evidence of vegetation and no further cardiac evaluation was warranted.   In July 2024, Katie Stanley was admitted with diarrhea, weakness,  fatigue, AKI, and electrolyte abnormalities in the setting of ongoing alcohol use.  She had mild troponin elevation to 83.  Echo showed normal LV function.     Ms. was last seen in cardiology clinic in early November 2024.  She reported occasional palpitations in the setting of alcohol use and presumed dehydration.  She continued drink half a 40 ounce bottle of whiskey regularly as well as ongoing tobacco abuse.  In the setting of chronic dyspnea and prior troponin elevation, stress testing was arranged however, patient did not show up for the testing. Objective  Home Medications    Current Outpatient Medications  Medication Sig Dispense Refill   citalopram (CELEXA) 20 MG tablet TAKE 1.5 TABLET(20 MG) BY MOUTH DAILY 135 tablet 1   doxycycline (VIBRA-TABS) 100 MG tablet Take 1 tablet (100 mg total) by mouth 2 (two) times daily. 20 tablet 0   folic acid (FOLVITE) 1 MG tablet Take 1 tablet (1 mg total) by mouth daily. 30 tablet 0   Iron, Ferrous Sulfate, 325 (65 Fe) MG TABS Take 325 mg by mouth daily. 30 tablet 5   lidocaine (LIDODERM) 5 % Place 1 patch onto the skin daily. Remove & Discard patch within 12 hours or as directed by MD 30 patch 0   MAGNESIUM PO Take 1 each by mouth in the morning and at bedtime. OTC patient is unsure of the dost     methocarbamol (ROBAXIN) 500 MG tablet Take 1 tablet (500 mg total) by mouth 2 (two) times daily. 20 tablet 0   metoprolol succinate (TOPROL-XL) 25 MG 24 hr tablet Take 1 tablet (25 mg total) by mouth daily. 90 tablet 1   Multiple Vitamin (MULTIVITAMIN WITH MINERALS) TABS tablet Take 1 tablet by mouth daily. 30 tablet 0   pantoprazole (PROTONIX) 40 MG tablet Take 1 tablet (40 mg total) by mouth 2 (two) times daily before a meal. 60 tablet 0   potassium chloride SA (KLOR-CON M) 20 MEQ tablet TAKE 1 TABLET(20 MEQ) BY MOUTH DAILY 22 tablet 0   sucralfate (CARAFATE) 1 GM/10ML suspension Take 10 mLs (1 g total) by mouth 4 (four) times daily -  with meals and at  bedtime. 420 mL 3   thiamine (VITAMIN B-1) 100 MG tablet Take 1 tablet (100 mg total) by mouth daily. (Patient not taking: Reported on 02/26/2023) 30 tablet 0   No current facility-administered medications for this visit.     Physical Exam    VS:  LMP  (LMP Unknown)  , BMI There is no height or weight on file to calculate BMI.       GEN: Well nourished, well developed, in no acute distress. HEENT: normal. Neck: Supple, no JVD, carotid bruits, or masses. Cardiac: RRR, no murmurs, rubs, or gallops. No clubbing, cyanosis, edema.  Radials 2+/PT 2+ and equal bilaterally.  Respiratory:  Respirations regular and unlabored, clear to auscultation bilaterally. GI: Soft, nontender, nondistended, BS + x 4. MS: no deformity or atrophy. Skin: warm and dry, no rash. Neuro:  Strength and sensation are intact. Psych: Normal affect.  Accessory Clinical Findings    ECG personally reviewed by me today -    *** -  no acute changes.  Lab Results  Component Value Date   WBC 9.8 03/06/2023   HGB 9.3 (L) 03/06/2023   HCT 27.7 (L) 03/06/2023   MCV 101 (H) 03/06/2023   PLT 194 03/06/2023   Lab Results  Component Value Date   CREATININE 1.17 (H) 03/06/2023   BUN 5 (L) 03/06/2023   NA 145 (H) 03/06/2023   K 3.7 03/06/2023   CL 107 (H) 03/06/2023   CO2 17 (L) 03/06/2023   Lab Results  Component Value Date   ALT 32 03/06/2023   AST 135 (H) 03/06/2023   ALKPHOS 383 (H) 03/06/2023   BILITOT 1.4 (H) 03/06/2023   Lab Results  Component Value Date   CHOL 144 02/06/2023   HDL 37 (L) 02/06/2023   LDLCALC 85 02/06/2023   TRIG 112 02/06/2023   CHOLHDL 3.9 02/06/2023    Lab Results  Component Value Date   HGBA1C 6.1 (H) 02/05/2023   Lab Results  Component Value Date   TSH 2.353 02/06/2023       Assessment & Plan    1.  ***  Nicolasa Ducking, NP 03/21/2023, 7:42 AM

## 2023-03-21 NOTE — Telephone Encounter (Signed)
Reordered 01/18/23 #22 and sent to requesting pharmacy  Requested Prescriptions  Refused Prescriptions Disp Refills   potassium chloride SA (KLOR-CON M) 20 MEQ tablet [Pharmacy Med Name: POTASSIUM CL ER TABLETS] 90 tablet     Sig: TAKE 1 TABLET(20 MEQ) BY MOUTH DAILY     Endocrinology:  Minerals - Potassium Supplementation Failed - 03/20/2023 11:00 AM      Failed - Cr in normal range and within 360 days    Creatinine, Ser  Date Value Ref Range Status  03/06/2023 1.17 (H) 0.57 - 1.00 mg/dL Final         Passed - K in normal range and within 360 days    Potassium  Date Value Ref Range Status  03/06/2023 3.7 3.5 - 5.2 mmol/L Final         Passed - Valid encounter within last 12 months    Recent Outpatient Visits           3 weeks ago Cellulitis of left arm   [place] [place], Megan P, DO   4 weeks ago Hepatic steatosis   [facility] [place], Megan P, DO   1 month ago AKI (acute kidney injury) [facility])   [facility] [place], Megan P, DO   1 month ago Routine general medical examination at a health care facility   [facility] [place], Megan P, DO   3 months ago Alcohol abuse   [facility] [place], Oswaldo Conroy, PA-C

## 2023-04-09 ENCOUNTER — Other Ambulatory Visit: Payer: Self-pay

## 2023-04-09 ENCOUNTER — Inpatient Hospital Stay
Admission: EM | Admit: 2023-04-09 | Discharge: 2023-04-19 | DRG: 871 | Disposition: A | Discharge status: Home / Self Care | Payer: Medicaid Other | Attending: Internal Medicine | Admitting: Internal Medicine

## 2023-04-09 ENCOUNTER — Emergency Department: Payer: Medicaid Other

## 2023-04-09 DIAGNOSIS — K529 Noninfective gastroenteritis and colitis, unspecified: Secondary | ICD-10-CM | POA: Diagnosis present

## 2023-04-09 DIAGNOSIS — E871 Hypo-osmolality and hyponatremia: Secondary | ICD-10-CM | POA: Diagnosis present

## 2023-04-09 DIAGNOSIS — G9341 Metabolic encephalopathy: Secondary | ICD-10-CM | POA: Diagnosis not present

## 2023-04-09 DIAGNOSIS — D6959 Other secondary thrombocytopenia: Secondary | ICD-10-CM | POA: Diagnosis present

## 2023-04-09 DIAGNOSIS — Z9049 Acquired absence of other specified parts of digestive tract: Secondary | ICD-10-CM

## 2023-04-09 DIAGNOSIS — K7011 Alcoholic hepatitis with ascites: Secondary | ICD-10-CM | POA: Diagnosis not present

## 2023-04-09 DIAGNOSIS — K760 Fatty (change of) liver, not elsewhere classified: Secondary | ICD-10-CM | POA: Diagnosis present

## 2023-04-09 DIAGNOSIS — Z8619 Personal history of other infectious and parasitic diseases: Secondary | ICD-10-CM

## 2023-04-09 DIAGNOSIS — I10 Essential (primary) hypertension: Secondary | ICD-10-CM | POA: Diagnosis not present

## 2023-04-09 DIAGNOSIS — J9601 Acute respiratory failure with hypoxia: Secondary | ICD-10-CM | POA: Diagnosis not present

## 2023-04-09 DIAGNOSIS — Z8249 Family history of ischemic heart disease and other diseases of the circulatory system: Secondary | ICD-10-CM

## 2023-04-09 DIAGNOSIS — Z720 Tobacco use: Secondary | ICD-10-CM | POA: Diagnosis not present

## 2023-04-09 DIAGNOSIS — E872 Acidosis, unspecified: Secondary | ICD-10-CM | POA: Diagnosis present

## 2023-04-09 DIAGNOSIS — I1310 Hypertensive heart and chronic kidney disease without heart failure, with stage 1 through stage 4 chronic kidney disease, or unspecified chronic kidney disease: Secondary | ICD-10-CM | POA: Diagnosis present

## 2023-04-09 DIAGNOSIS — E441 Mild protein-calorie malnutrition: Secondary | ICD-10-CM | POA: Diagnosis present

## 2023-04-09 DIAGNOSIS — F1210 Cannabis abuse, uncomplicated: Secondary | ICD-10-CM | POA: Diagnosis present

## 2023-04-09 DIAGNOSIS — R6520 Severe sepsis without septic shock: Secondary | ICD-10-CM | POA: Diagnosis present

## 2023-04-09 DIAGNOSIS — J189 Pneumonia, unspecified organism: Secondary | ICD-10-CM | POA: Diagnosis present

## 2023-04-09 DIAGNOSIS — R7989 Other specified abnormal findings of blood chemistry: Secondary | ICD-10-CM | POA: Diagnosis not present

## 2023-04-09 DIAGNOSIS — K766 Portal hypertension: Secondary | ICD-10-CM | POA: Diagnosis present

## 2023-04-09 DIAGNOSIS — Z515 Encounter for palliative care: Secondary | ICD-10-CM | POA: Diagnosis not present

## 2023-04-09 DIAGNOSIS — R197 Diarrhea, unspecified: Secondary | ICD-10-CM | POA: Diagnosis not present

## 2023-04-09 DIAGNOSIS — J441 Chronic obstructive pulmonary disease with (acute) exacerbation: Secondary | ICD-10-CM | POA: Diagnosis present

## 2023-04-09 DIAGNOSIS — E669 Obesity, unspecified: Secondary | ICD-10-CM | POA: Diagnosis present

## 2023-04-09 DIAGNOSIS — A419 Sepsis, unspecified organism: Principal | ICD-10-CM

## 2023-04-09 DIAGNOSIS — N179 Acute kidney failure, unspecified: Secondary | ICD-10-CM | POA: Diagnosis present

## 2023-04-09 DIAGNOSIS — R188 Other ascites: Secondary | ICD-10-CM

## 2023-04-09 DIAGNOSIS — F10239 Alcohol dependence with withdrawal, unspecified: Secondary | ICD-10-CM | POA: Diagnosis not present

## 2023-04-09 DIAGNOSIS — D539 Nutritional anemia, unspecified: Secondary | ICD-10-CM | POA: Diagnosis present

## 2023-04-09 DIAGNOSIS — K863 Pseudocyst of pancreas: Secondary | ICD-10-CM | POA: Diagnosis present

## 2023-04-09 DIAGNOSIS — R109 Unspecified abdominal pain: Secondary | ICD-10-CM | POA: Diagnosis present

## 2023-04-09 DIAGNOSIS — F418 Other specified anxiety disorders: Secondary | ICD-10-CM | POA: Diagnosis not present

## 2023-04-09 DIAGNOSIS — K767 Hepatorenal syndrome: Secondary | ICD-10-CM | POA: Diagnosis present

## 2023-04-09 DIAGNOSIS — K7031 Alcoholic cirrhosis of liver with ascites: Secondary | ICD-10-CM | POA: Diagnosis present

## 2023-04-09 DIAGNOSIS — E1122 Type 2 diabetes mellitus with diabetic chronic kidney disease: Secondary | ICD-10-CM | POA: Diagnosis present

## 2023-04-09 DIAGNOSIS — Z6835 Body mass index (BMI) 35.0-35.9, adult: Secondary | ICD-10-CM

## 2023-04-09 DIAGNOSIS — F1010 Alcohol abuse, uncomplicated: Secondary | ICD-10-CM | POA: Diagnosis not present

## 2023-04-09 DIAGNOSIS — T380X5A Adverse effect of glucocorticoids and synthetic analogues, initial encounter: Secondary | ICD-10-CM | POA: Diagnosis not present

## 2023-04-09 DIAGNOSIS — Z803 Family history of malignant neoplasm of breast: Secondary | ICD-10-CM

## 2023-04-09 DIAGNOSIS — E860 Dehydration: Secondary | ICD-10-CM | POA: Diagnosis present

## 2023-04-09 DIAGNOSIS — R112 Nausea with vomiting, unspecified: Secondary | ICD-10-CM | POA: Diagnosis not present

## 2023-04-09 DIAGNOSIS — Z7189 Other specified counseling: Secondary | ICD-10-CM | POA: Diagnosis not present

## 2023-04-09 DIAGNOSIS — E876 Hypokalemia: Secondary | ICD-10-CM | POA: Diagnosis not present

## 2023-04-09 DIAGNOSIS — Z880 Allergy status to penicillin: Secondary | ICD-10-CM

## 2023-04-09 DIAGNOSIS — Z1152 Encounter for screening for COVID-19: Secondary | ICD-10-CM | POA: Diagnosis not present

## 2023-04-09 DIAGNOSIS — Z79899 Other long term (current) drug therapy: Secondary | ICD-10-CM

## 2023-04-09 DIAGNOSIS — Z800 Family history of malignant neoplasm of digestive organs: Secondary | ICD-10-CM

## 2023-04-09 DIAGNOSIS — J810 Acute pulmonary edema: Secondary | ICD-10-CM | POA: Diagnosis not present

## 2023-04-09 DIAGNOSIS — F1110 Opioid abuse, uncomplicated: Secondary | ICD-10-CM | POA: Diagnosis present

## 2023-04-09 DIAGNOSIS — J440 Chronic obstructive pulmonary disease with acute lower respiratory infection: Secondary | ICD-10-CM | POA: Diagnosis present

## 2023-04-09 DIAGNOSIS — F1910 Other psychoactive substance abuse, uncomplicated: Secondary | ICD-10-CM | POA: Diagnosis not present

## 2023-04-09 DIAGNOSIS — R079 Chest pain, unspecified: Secondary | ICD-10-CM | POA: Diagnosis not present

## 2023-04-09 DIAGNOSIS — E8770 Fluid overload, unspecified: Secondary | ICD-10-CM | POA: Diagnosis present

## 2023-04-09 DIAGNOSIS — E878 Other disorders of electrolyte and fluid balance, not elsewhere classified: Secondary | ICD-10-CM | POA: Diagnosis present

## 2023-04-09 DIAGNOSIS — D72829 Elevated white blood cell count, unspecified: Secondary | ICD-10-CM | POA: Diagnosis present

## 2023-04-09 DIAGNOSIS — N1831 Chronic kidney disease, stage 3a: Secondary | ICD-10-CM | POA: Diagnosis present

## 2023-04-09 DIAGNOSIS — Z833 Family history of diabetes mellitus: Secondary | ICD-10-CM

## 2023-04-09 DIAGNOSIS — K219 Gastro-esophageal reflux disease without esophagitis: Secondary | ICD-10-CM | POA: Diagnosis present

## 2023-04-09 DIAGNOSIS — G4733 Obstructive sleep apnea (adult) (pediatric): Secondary | ICD-10-CM | POA: Diagnosis not present

## 2023-04-09 DIAGNOSIS — F17210 Nicotine dependence, cigarettes, uncomplicated: Secondary | ICD-10-CM | POA: Diagnosis present

## 2023-04-09 LAB — CBC WITH DIFFERENTIAL/PLATELET
Abs Immature Granulocytes: 0.08 10*3/uL — ABNORMAL HIGH (ref 0.00–0.07)
Basophils Absolute: 0.1 10*3/uL (ref 0.0–0.1)
Basophils Relative: 1 %
Eosinophils Absolute: 0.1 10*3/uL (ref 0.0–0.5)
Eosinophils Relative: 0 %
HCT: 31.5 % — ABNORMAL LOW (ref 36.0–46.0)
Hemoglobin: 10.7 g/dL — ABNORMAL LOW (ref 12.0–15.0)
Immature Granulocytes: 1 %
Lymphocytes Relative: 8 %
Lymphs Abs: 1.3 10*3/uL (ref 0.7–4.0)
MCH: 34.3 pg — ABNORMAL HIGH (ref 26.0–34.0)
MCHC: 34 g/dL (ref 30.0–36.0)
MCV: 101 fL — ABNORMAL HIGH (ref 80.0–100.0)
Monocytes Absolute: 1.5 10*3/uL — ABNORMAL HIGH (ref 0.1–1.0)
Monocytes Relative: 9 %
Neutro Abs: 13.3 10*3/uL — ABNORMAL HIGH (ref 1.7–7.7)
Neutrophils Relative %: 81 %
Platelets: 172 10*3/uL (ref 150–400)
RBC: 3.12 MIL/uL — ABNORMAL LOW (ref 3.87–5.11)
RDW: 16 % — ABNORMAL HIGH (ref 11.5–15.5)
WBC: 16 10*3/uL — ABNORMAL HIGH (ref 4.0–10.5)
nRBC: 0 % (ref 0.0–0.2)

## 2023-04-09 LAB — URINALYSIS, ROUTINE W REFLEX MICROSCOPIC
Glucose, UA: NEGATIVE mg/dL
Hgb urine dipstick: NEGATIVE
Ketones, ur: NEGATIVE mg/dL
Leukocytes,Ua: NEGATIVE
Nitrite: NEGATIVE
Protein, ur: 30 mg/dL — AB
Specific Gravity, Urine: >1.046 — ABNORMAL HIGH (ref 1.005–1.030)
pH: 5 (ref 5.0–8.0)

## 2023-04-09 LAB — COMPREHENSIVE METABOLIC PANEL
ALT: 38 U/L (ref 0–44)
AST: 119 U/L — ABNORMAL HIGH (ref 15–41)
Albumin: 2.7 g/dL — ABNORMAL LOW (ref 3.5–5.0)
Alkaline Phosphatase: 386 U/L — ABNORMAL HIGH (ref 38–126)
Anion gap: 27 — ABNORMAL HIGH (ref 5–15)
BUN: <5 mg/dL — ABNORMAL LOW (ref 6–20)
CO2: 16 mmol/L — ABNORMAL LOW (ref 22–32)
Calcium: 8.1 mg/dL — ABNORMAL LOW (ref 8.9–10.3)
Chloride: 91 mmol/L — ABNORMAL LOW (ref 98–111)
Creatinine, Ser: 1.56 mg/dL — ABNORMAL HIGH (ref 0.44–1.00)
GFR, Estimated: 44 mL/min — ABNORMAL LOW (ref >60)
Glucose, Bld: 107 mg/dL — ABNORMAL HIGH (ref 70–99)
Potassium: 2.8 mmol/L — ABNORMAL LOW (ref 3.5–5.1)
Sodium: 134 mmol/L — ABNORMAL LOW (ref 135–145)
Total Bilirubin: 6.3 mg/dL — ABNORMAL HIGH (ref 0.0–1.2)
Total Protein: 7.4 g/dL (ref 6.5–8.1)

## 2023-04-09 LAB — URINE DRUG SCREEN, QUALITATIVE (ARMC ONLY)
Amphetamines, Ur Screen: NOT DETECTED
Barbiturates, Ur Screen: NOT DETECTED
Benzodiazepine, Ur Scrn: NOT DETECTED
Cannabinoid 50 Ng, Ur ~~LOC~~: POSITIVE — AB
Cocaine Metabolite,Ur ~~LOC~~: NOT DETECTED
MDMA (Ecstasy)Ur Screen: NOT DETECTED
Methadone Scn, Ur: NOT DETECTED
Opiate, Ur Screen: POSITIVE — AB
Phencyclidine (PCP) Ur S: NOT DETECTED
Tricyclic, Ur Screen: NOT DETECTED

## 2023-04-09 LAB — TROPONIN I (HIGH SENSITIVITY): Troponin I (High Sensitivity): 17 ng/L (ref <18)

## 2023-04-09 LAB — AMMONIA: Ammonia: 97 umol/L — ABNORMAL HIGH (ref 9–35)

## 2023-04-09 LAB — RESP PANEL BY RT-PCR (RSV, FLU A&B, COVID)  RVPGX2
Influenza A by PCR: NEGATIVE
Influenza B by PCR: NEGATIVE
Resp Syncytial Virus by PCR: NEGATIVE
SARS Coronavirus 2 by RT PCR: NEGATIVE

## 2023-04-09 LAB — PREGNANCY, URINE: Preg Test, Ur: NEGATIVE

## 2023-04-09 LAB — PROTIME-INR
INR: 1.3 — ABNORMAL HIGH (ref 0.8–1.2)
Prothrombin Time: 16.8 s — ABNORMAL HIGH (ref 11.4–15.2)

## 2023-04-09 LAB — PHOSPHORUS: Phosphorus: 3 mg/dL (ref 2.5–4.6)

## 2023-04-09 LAB — LACTATE DEHYDROGENASE: LDH: 366 U/L — ABNORMAL HIGH (ref 98–192)

## 2023-04-09 LAB — LACTIC ACID, PLASMA
Lactic Acid, Venous: 6.2 mmol/L (ref 0.5–1.9)
Lactic Acid, Venous: 5.1 mmol/L (ref 0.5–1.9)

## 2023-04-09 LAB — LIPASE, BLOOD: Lipase: 33 U/L (ref 11–51)

## 2023-04-09 LAB — C DIFFICILE QUICK SCREEN W PCR REFLEX
C Diff antigen: NEGATIVE
C Diff interpretation: NOT DETECTED
C Diff toxin: NEGATIVE

## 2023-04-09 LAB — BILIRUBIN, FRACTIONATED(TOT/DIR/INDIR)
Bilirubin, Direct: 3.5 mg/dL — ABNORMAL HIGH (ref 0.0–0.2)
Indirect Bilirubin: 3 mg/dL — ABNORMAL HIGH (ref 0.3–0.9)
Total Bilirubin: 6.5 mg/dL — ABNORMAL HIGH (ref 0.0–1.2)

## 2023-04-09 LAB — APTT: aPTT: 35 s (ref 24–36)

## 2023-04-09 LAB — MAGNESIUM: Magnesium: 1.1 mg/dL — ABNORMAL LOW (ref 1.7–2.4)

## 2023-04-09 LAB — ETHANOL: Alcohol, Ethyl (B): <10 mg/dL (ref <10)

## 2023-04-09 LAB — PROCALCITONIN: Procalcitonin: 2.07 ng/mL

## 2023-04-09 MED ORDER — IOHEXOL 350 MG/ML SOLN
75.0000 mL | Freq: Once | INTRAVENOUS | Status: AC | PRN
  Administered 2023-04-09: 75 mL via INTRAVENOUS

## 2023-04-09 MED ORDER — FOLIC ACID 1 MG PO TABS
1.0000 mg | ORAL_TABLET | Freq: Every day | ORAL | Status: DC
  Administered 2023-04-09 – 2023-04-19 (×11): 1 mg via ORAL
  Filled 2023-04-09 (×11): qty 1

## 2023-04-09 MED ORDER — SODIUM CHLORIDE 0.9 % IV SOLN
2.0000 g | Freq: Once | INTRAVENOUS | Status: AC
  Administered 2023-04-09: 2 g via INTRAVENOUS
  Filled 2023-04-09: qty 10

## 2023-04-09 MED ORDER — OXYCODONE HCL 5 MG PO TABS
5.0000 mg | ORAL_TABLET | Freq: Four times a day (QID) | ORAL | Status: DC | PRN
  Administered 2023-04-10 – 2023-04-16 (×11): 5 mg via ORAL
  Filled 2023-04-09 (×11): qty 1

## 2023-04-09 MED ORDER — ALBUTEROL SULFATE (2.5 MG/3ML) 0.083% IN NEBU: 2.5000 mg | INHALATION_SOLUTION | RESPIRATORY_TRACT | Status: DC | PRN

## 2023-04-09 MED ORDER — IBUPROFEN 200 MG PO TABS: 200.0000 mg | ORAL_TABLET | Freq: Four times a day (QID) | ORAL | Status: DC | PRN

## 2023-04-09 MED ORDER — MORPHINE SULFATE (PF) 2 MG/ML IV SOLN
2.0000 mg | INTRAVENOUS | Status: DC | PRN
  Administered 2023-04-09 – 2023-04-16 (×21): 2 mg via INTRAVENOUS
  Filled 2023-04-09 (×23): qty 1

## 2023-04-09 MED ORDER — SODIUM CHLORIDE 0.9 % IV SOLN
2.0000 g | Freq: Three times a day (TID) | INTRAVENOUS | Status: DC
  Administered 2023-04-10: 2 g via INTRAVENOUS
  Filled 2023-04-09: qty 10

## 2023-04-09 MED ORDER — DM-GUAIFENESIN ER 30-600 MG PO TB12: 1.0000 | ORAL_TABLET | Freq: Two times a day (BID) | ORAL | Status: DC | PRN

## 2023-04-09 MED ORDER — ONDANSETRON HCL 4 MG/2ML IJ SOLN
4.0000 mg | Freq: Three times a day (TID) | INTRAMUSCULAR | Status: DC | PRN
  Administered 2023-04-10 – 2023-04-13 (×2): 4 mg via INTRAVENOUS
  Filled 2023-04-09 (×2): qty 2

## 2023-04-09 MED ORDER — ALBUMIN HUMAN 25 % IV SOLN
25.0000 g | Freq: Once | INTRAVENOUS | Status: AC
  Administered 2023-04-10: 25 g via INTRAVENOUS
  Filled 2023-04-09: qty 100

## 2023-04-09 MED ORDER — LORAZEPAM 1 MG PO TABS
1.0000 mg | ORAL_TABLET | ORAL | Status: AC | PRN
Start: 2023-04-09 — End: 2023-04-12
  Administered 2023-04-10: 1 mg via ORAL
  Filled 2023-04-09: qty 1

## 2023-04-09 MED ORDER — POTASSIUM CHLORIDE 10 MEQ/100ML IV SOLN
10.0000 meq | Freq: Once | INTRAVENOUS | Status: AC
  Administered 2023-04-09: 10 meq via INTRAVENOUS
  Filled 2023-04-09: qty 100

## 2023-04-09 MED ORDER — ADULT MULTIVITAMIN W/MINERALS CH
1.0000 | ORAL_TABLET | Freq: Every day | ORAL | Status: DC
  Administered 2023-04-09 – 2023-04-19 (×11): 1 via ORAL
  Filled 2023-04-09 (×11): qty 1

## 2023-04-09 MED ORDER — LORAZEPAM 2 MG/ML IJ SOLN
0.0000 mg | Freq: Four times a day (QID) | INTRAMUSCULAR | Status: AC
  Administered 2023-04-10: 2 mg via INTRAVENOUS
  Administered 2023-04-10 – 2023-04-11 (×2): 1 mg via INTRAVENOUS
  Administered 2023-04-11: 2 mg via INTRAVENOUS
  Filled 2023-04-09 (×4): qty 1

## 2023-04-09 MED ORDER — ONDANSETRON 4 MG PO TBDP
4.0000 mg | ORAL_TABLET | Freq: Once | ORAL | Status: AC
  Administered 2023-04-09: 4 mg via ORAL
  Filled 2023-04-09: qty 1

## 2023-04-09 MED ORDER — NICOTINE 21 MG/24HR TD PT24
21.0000 mg | MEDICATED_PATCH | Freq: Every day | TRANSDERMAL | Status: DC
  Administered 2023-04-12: 21 mg via TRANSDERMAL
  Filled 2023-04-09 (×9): qty 1

## 2023-04-09 MED ORDER — SODIUM CHLORIDE 0.9 % IV SOLN
INTRAVENOUS | Status: DC
Start: 2023-04-09 — End: 2023-04-10

## 2023-04-09 MED ORDER — HYDRALAZINE HCL 20 MG/ML IJ SOLN: 5.0000 mg | INTRAMUSCULAR | Status: DC | PRN

## 2023-04-09 MED ORDER — SODIUM CHLORIDE 0.9 % IV BOLUS
1000.0000 mL | Freq: Once | INTRAVENOUS | Status: AC
  Administered 2023-04-09: 1000 mL via INTRAVENOUS

## 2023-04-09 MED ORDER — METRONIDAZOLE 500 MG/100ML IV SOLN
500.0000 mg | Freq: Once | INTRAVENOUS | Status: AC
  Administered 2023-04-09: 500 mg via INTRAVENOUS
  Filled 2023-04-09: qty 100

## 2023-04-09 MED ORDER — THIAMINE HCL 100 MG/ML IJ SOLN
100.0000 mg | Freq: Every day | INTRAMUSCULAR | Status: DC
  Filled 2023-04-09 (×2): qty 2

## 2023-04-09 MED ORDER — MAGNESIUM SULFATE 2 GM/50ML IV SOLN
2.0000 g | Freq: Once | INTRAVENOUS | Status: AC
  Administered 2023-04-09: 2 g via INTRAVENOUS
  Filled 2023-04-09: qty 50

## 2023-04-09 MED ORDER — THIAMINE MONONITRATE 100 MG PO TABS
100.0000 mg | ORAL_TABLET | Freq: Every day | ORAL | Status: DC
  Administered 2023-04-09 – 2023-04-19 (×11): 100 mg via ORAL
  Filled 2023-04-09 (×11): qty 1

## 2023-04-09 MED ORDER — LACTATED RINGERS IV BOLUS (SEPSIS)
1000.0000 mL | Freq: Once | INTRAVENOUS | Status: AC
  Administered 2023-04-09: 1000 mL via INTRAVENOUS

## 2023-04-09 MED ORDER — MORPHINE SULFATE (PF) 4 MG/ML IV SOLN
4.0000 mg | Freq: Once | INTRAVENOUS | Status: AC
  Administered 2023-04-09: 4 mg via INTRAMUSCULAR
  Filled 2023-04-09: qty 1

## 2023-04-09 MED ORDER — LORAZEPAM 2 MG/ML IJ SOLN
0.0000 mg | Freq: Two times a day (BID) | INTRAMUSCULAR | Status: AC
  Filled 2023-04-09: qty 1

## 2023-04-09 MED ORDER — HEPARIN SODIUM (PORCINE) 5000 UNIT/ML IJ SOLN
5000.0000 [IU] | Freq: Three times a day (TID) | INTRAMUSCULAR | Status: DC
  Administered 2023-04-09 – 2023-04-19 (×28): 5000 [IU] via SUBCUTANEOUS
  Filled 2023-04-09 (×28): qty 1

## 2023-04-09 MED ORDER — CHLORDIAZEPOXIDE HCL 25 MG PO CAPS
25.0000 mg | ORAL_CAPSULE | Freq: Three times a day (TID) | ORAL | Status: DC
  Administered 2023-04-09 – 2023-04-12 (×8): 25 mg via ORAL
  Filled 2023-04-09 (×8): qty 1

## 2023-04-09 MED ORDER — METRONIDAZOLE 500 MG/100ML IV SOLN
500.0000 mg | Freq: Two times a day (BID) | INTRAVENOUS | Status: AC
  Administered 2023-04-10 – 2023-04-14 (×10): 500 mg via INTRAVENOUS
  Filled 2023-04-09 (×11): qty 100

## 2023-04-09 MED ORDER — LORAZEPAM 2 MG/ML IJ SOLN
1.0000 mg | INTRAMUSCULAR | Status: AC | PRN
  Administered 2023-04-10: 1 mg via INTRAVENOUS
  Administered 2023-04-11 – 2023-04-12 (×3): 2 mg via INTRAVENOUS
  Administered 2023-04-12: 1 mg via INTRAVENOUS
  Filled 2023-04-09 (×5): qty 1

## 2023-04-09 MED ORDER — POTASSIUM CHLORIDE CRYS ER 20 MEQ PO TBCR
40.0000 meq | EXTENDED_RELEASE_TABLET | Freq: Once | ORAL | Status: AC
  Administered 2023-04-09: 40 meq via ORAL
  Filled 2023-04-09: qty 2

## 2023-04-09 MED ORDER — VANCOMYCIN HCL IN DEXTROSE 1-5 GM/200ML-% IV SOLN
1000.0000 mg | Freq: Once | INTRAVENOUS | Status: AC
  Administered 2023-04-09: 1000 mg via INTRAVENOUS
  Filled 2023-04-09: qty 200

## 2023-04-09 NOTE — ED Notes (Signed)
IV team at bedside 

## 2023-04-09 NOTE — ED Notes (Signed)
IV team consult put in due to unable to obtain labs from phlebotomy and multiple attempts to collect from phlebotomy, EDTs, and RNs

## 2023-04-09 NOTE — ED Notes (Signed)
MD made aware critical lactic 6.2.

## 2023-04-09 NOTE — ED Notes (Signed)
Pt placed on monitor and hooked up. Iv team consult placed due to difficulty of getting pt blood.

## 2023-04-09 NOTE — Progress Notes (Signed)
PHARMACY CONSULT NOTE - FOLLOW UP  Pharmacy Consult for Electrolyte Monitoring and Replacement   Recent Labs: Potassium (mmol/L)  Date Value  04/09/2023 2.8 (L)   Magnesium (mg/dL)  Date Value  69/62/9528 1.1 (L)   Calcium (mg/dL)  Date Value  41/32/4401 8.1 (L)   Albumin (g/dL)  Date Value  02/72/5366 2.7 (L)  03/06/2023 3.1 (L)   Phosphorus (mg/dL)  Date Value  44/06/4740 2.8   Sodium (mmol/L)  Date Value  04/09/2023 134 (L)  03/06/2023 145 (H)     Assessment: Patient is a 37yo female with electrolyte disturbance. Pharmacy consulted for replacement. Patient received KCl IV x 2 and Mag Sulfate 2g IV x 2 in the ED.  Plan:  Will order additional KCl PO x 1 and follow up with AM labs.  Clovia Cuff, PharmD, BCPS 04/09/2023 9:39 PM

## 2023-04-09 NOTE — ED Notes (Signed)
Lab attempted and will have someone else come and try again

## 2023-04-09 NOTE — Progress Notes (Signed)
Pharmacy Antibiotic Note  Katie Stanley is a 37 y.o. female admitted on 04/09/2023 with  possible intra-abdominal infection .  Pharmacy has been consulted for Aztreonam dosing.  Plan: Aztreonam 2g IV q8h  Height: 5\' 3"  (160 cm) Weight: 88 kg (194 lb 0.1 oz) IBW/kg (Calculated) : 52.4  Temp (24hrs), Avg:99 F (37.2 C), Min:98.9 F (37.2 C), Max:99 F (37.2 C)  Recent Labs  Lab 04/09/23 1144 04/09/23 1746 04/09/23 1941  WBC  --  16.0*  --   CREATININE 1.56*  --   --   LATICACIDVEN  --   --  6.2*    Estimated Creatinine Clearance: 51.9 mL/min (A) (by C-G formula based on SCr of 1.56 mg/dL (H)).    Allergies  Allergen Reactions   Penicillins Swelling    Antimicrobials this admission: Aztreonam 12/30 >>  Metronidazole 12/30 >>   Dose adjustments this admission:  Microbiology results:   Thank you for allowing pharmacy to be a part of this patient's care.  Clovia Cuff, PharmD, BCPS 04/09/2023 9:41 PM

## 2023-04-09 NOTE — Progress Notes (Signed)
CODE SEPSIS - PHARMACY COMMUNICATION  **Broad Spectrum Antibiotics should be administered within 1 hour of Sepsis diagnosis**  Time Code Sepsis Called/Page Received: 20:16  Antibiotics Ordered: Aztreonam and Vancomycin  Time of 1st antibiotic administration: Aztreonam given at 20:51  Additional action taken by pharmacy: n/a  If necessary, Name of Provider/Nurse Contacted: n/a    Foye Deer ,PharmD Clinical Pharmacist  04/09/2023  8:18 PM

## 2023-04-09 NOTE — ED Notes (Signed)
Lab able to get green top on pt.

## 2023-04-09 NOTE — ED Provider Triage Note (Signed)
Emergency Medicine Provider Triage Evaluation Note  Katie Stanley , a 37 y.o. female  was evaluated in triage.  Pt complains of chest pain, SOB and abdominal "tightness" that started yesterday.  + Nausea.  States that she drinks every day but no etoh in 2 days.    Review of Systems  Positive: +Cardiac history, DM, Alcohol abuse,  ? Fever, chills? Negative: No Urinary sx.   Physical Exam  There were no vitals taken for this visit. Gen:   Awake, no distress   Resp:  Normal effort   cough noted MSK:   Moves extremities without difficulty  Other:    Medical Decision Making  Medically screening exam initiated at 11:07 AM.  Appropriate orders placed.  Katie Stanley was informed that the remainder of the evaluation will be completed by another provider, this initial triage assessment does not replace that evaluation, and the importance of remaining in the ED until their evaluation is complete.     Tommi Rumps, PA-C 04/09/23 1123

## 2023-04-09 NOTE — ED Notes (Signed)
Lab called to send phlebotomy to collect labs due to difficulty collecting.

## 2023-04-09 NOTE — ED Notes (Signed)
Lab called to assist with blood draw 

## 2023-04-09 NOTE — ED Notes (Signed)
Lab called for blood draw.

## 2023-04-09 NOTE — Sepsis Progress Note (Signed)
Following for sepsis monitoring ?

## 2023-04-09 NOTE — ED Triage Notes (Signed)
Pt presents to ED with c/o of CP and SOB that started yesterday.   Pt states everyday drinker and states hasn't drank in 2 days and has gone into withdrawals in the past. NAD noted.

## 2023-04-09 NOTE — Progress Notes (Addendum)
04/09/23  Discussed patient with Dr. Marisa Severin.  Patient presents with nausea, vomiting, diarrhea, chest pain, shortness of breath.  Hx of EtOH abuse.  Labwork with electrolyte abnormalities, elevated LFTs, elevated lactic acid.  CT scan with large volume of ascites and diffuse bowel thickening and stranding of both small and large bowel.  No atherosclerosis or evidence of any stenosis of SMA, celiac, or IMA.  Very unlikely to be ischemic process.  Given history and labwork, likely to be infectious, compounded by her EtOH abuse and liver issues.  No surgical intervention is needed.  OK to admit to medical team and manage for gastroenteritis type picture, aggressive hydration, monitor for EtOH withdrawal.  Henrene Dodge, MD

## 2023-04-10 ENCOUNTER — Other Ambulatory Visit: Payer: Self-pay

## 2023-04-10 ENCOUNTER — Inpatient Hospital Stay: Payer: Medicaid Other

## 2023-04-10 DIAGNOSIS — R112 Nausea with vomiting, unspecified: Secondary | ICD-10-CM | POA: Diagnosis not present

## 2023-04-10 DIAGNOSIS — Z7189 Other specified counseling: Secondary | ICD-10-CM | POA: Diagnosis not present

## 2023-04-10 DIAGNOSIS — R197 Diarrhea, unspecified: Secondary | ICD-10-CM | POA: Diagnosis not present

## 2023-04-10 LAB — BODY FLUID CELL COUNT WITH DIFFERENTIAL
Eos, Fluid: 0 %
Lymphs, Fluid: 7 %
Monocyte-Macrophage-Serous Fluid: 54 %
Neutrophil Count, Fluid: 39 %
Total Nucleated Cell Count, Fluid: 128 uL

## 2023-04-10 LAB — ACETAMINOPHEN LEVEL: Acetaminophen (Tylenol), Serum: <10 ug/mL — ABNORMAL LOW (ref 10–30)

## 2023-04-10 LAB — COMPREHENSIVE METABOLIC PANEL
ALT: 36 U/L (ref 0–44)
AST: 103 U/L — ABNORMAL HIGH (ref 15–41)
Albumin: 2.4 g/dL — ABNORMAL LOW (ref 3.5–5.0)
Alkaline Phosphatase: 305 U/L — ABNORMAL HIGH (ref 38–126)
Anion gap: 13 (ref 5–15)
BUN: 5 mg/dL — ABNORMAL LOW (ref 6–20)
CO2: 24 mmol/L (ref 22–32)
Calcium: 7.6 mg/dL — ABNORMAL LOW (ref 8.9–10.3)
Chloride: 95 mmol/L — ABNORMAL LOW (ref 98–111)
Creatinine, Ser: 1.75 mg/dL — ABNORMAL HIGH (ref 0.44–1.00)
GFR, Estimated: 38 mL/min — ABNORMAL LOW (ref >60)
Glucose, Bld: 104 mg/dL — ABNORMAL HIGH (ref 70–99)
Potassium: 3 mmol/L — ABNORMAL LOW (ref 3.5–5.1)
Sodium: 132 mmol/L — ABNORMAL LOW (ref 135–145)
Total Bilirubin: 5.4 mg/dL — ABNORMAL HIGH (ref 0.0–1.2)
Total Protein: 6.4 g/dL — ABNORMAL LOW (ref 6.5–8.1)

## 2023-04-10 LAB — CBC
HCT: 28.1 % — ABNORMAL LOW (ref 36.0–46.0)
Hemoglobin: 9.7 g/dL — ABNORMAL LOW (ref 12.0–15.0)
MCH: 35.7 pg — ABNORMAL HIGH (ref 26.0–34.0)
MCHC: 34.5 g/dL (ref 30.0–36.0)
MCV: 103.3 fL — ABNORMAL HIGH (ref 80.0–100.0)
Platelets: 136 10*3/uL — ABNORMAL LOW (ref 150–400)
RBC: 2.72 MIL/uL — ABNORMAL LOW (ref 3.87–5.11)
RDW: 16.1 % — ABNORMAL HIGH (ref 11.5–15.5)
WBC: 14.3 10*3/uL — ABNORMAL HIGH (ref 4.0–10.5)
nRBC: 0.1 % (ref 0.0–0.2)

## 2023-04-10 LAB — GASTROINTESTINAL PANEL BY PCR, STOOL (REPLACES STOOL CULTURE)

## 2023-04-10 LAB — SEDIMENTATION RATE: Sed Rate: 21 mm/h (ref 0–22)

## 2023-04-10 LAB — AMMONIA: Ammonia: 95 umol/L — ABNORMAL HIGH (ref 9–35)

## 2023-04-10 LAB — OSMOLALITY: Osmolality: 285 mosm/kg (ref 275–295)

## 2023-04-10 LAB — ALBUMIN, PLEURAL OR PERITONEAL FLUID: Albumin, Fluid: <1.5 g/dL

## 2023-04-10 LAB — BASIC METABOLIC PANEL
Anion gap: 15 (ref 5–15)
BUN: 6 mg/dL (ref 6–20)
CO2: 24 mmol/L (ref 22–32)
Calcium: 7.6 mg/dL — ABNORMAL LOW (ref 8.9–10.3)
Chloride: 96 mmol/L — ABNORMAL LOW (ref 98–111)
Creatinine, Ser: 1.61 mg/dL — ABNORMAL HIGH (ref 0.44–1.00)
GFR, Estimated: 42 mL/min — ABNORMAL LOW (ref >60)
Glucose, Bld: 128 mg/dL — ABNORMAL HIGH (ref 70–99)
Potassium: 3 mmol/L — ABNORMAL LOW (ref 3.5–5.1)
Sodium: 135 mmol/L (ref 135–145)

## 2023-04-10 LAB — GLUCOSE, PLEURAL OR PERITONEAL FLUID: Glucose, Fluid: 114 mg/dL

## 2023-04-10 LAB — MAGNESIUM
Magnesium: 1.9 mg/dL (ref 1.7–2.4)
Magnesium: 1.9 mg/dL (ref 1.7–2.4)

## 2023-04-10 LAB — LACTIC ACID, PLASMA: Lactic Acid, Venous: 1.8 mmol/L (ref 0.5–1.9)

## 2023-04-10 LAB — PHOSPHORUS: Phosphorus: 1.2 mg/dL — ABNORMAL LOW (ref 2.5–4.6)

## 2023-04-10 LAB — PROTEIN, PLEURAL OR PERITONEAL FLUID: Total protein, fluid: <3 g/dL

## 2023-04-10 LAB — SALICYLATE LEVEL: Salicylate Lvl: <7 mg/dL — ABNORMAL LOW (ref 7.0–30.0)

## 2023-04-10 LAB — LACTATE DEHYDROGENASE, PLEURAL OR PERITONEAL FLUID: LD, Fluid: 85 U/L — ABNORMAL HIGH (ref 3–23)

## 2023-04-10 LAB — C-REACTIVE PROTEIN: CRP: 10 mg/dL — ABNORMAL HIGH (ref <1.0)

## 2023-04-10 LAB — GAMMA GT: GGT: 516 U/L — ABNORMAL HIGH (ref 7–50)

## 2023-04-10 MED ORDER — LACTULOSE 10 GM/15ML PO SOLN: 20.0000 g | Freq: Two times a day (BID) | ORAL | Status: DC

## 2023-04-10 MED ORDER — CITALOPRAM HYDROBROMIDE 10 MG PO TABS
20.0000 mg | ORAL_TABLET | Freq: Every day | ORAL | Status: DC
  Administered 2023-04-10 – 2023-04-19 (×10): 20 mg via ORAL
  Filled 2023-04-10 (×5): qty 1
  Filled 2023-04-10: qty 2
  Filled 2023-04-10: qty 1
  Filled 2023-04-10: qty 2
  Filled 2023-04-10 (×2): qty 1

## 2023-04-10 MED ORDER — POTASSIUM CHLORIDE CRYS ER 20 MEQ PO TBCR
40.0000 meq | EXTENDED_RELEASE_TABLET | ORAL | Status: AC
  Administered 2023-04-10 (×2): 40 meq via ORAL
  Filled 2023-04-10 (×2): qty 2

## 2023-04-10 MED ORDER — ACETAMINOPHEN 500 MG PO TABS
500.0000 mg | ORAL_TABLET | Freq: Three times a day (TID) | ORAL | Status: DC | PRN
  Administered 2023-04-11 – 2023-04-14 (×2): 500 mg via ORAL
  Filled 2023-04-10 (×2): qty 1

## 2023-04-10 MED ORDER — PANTOPRAZOLE SODIUM 40 MG PO TBEC
40.0000 mg | DELAYED_RELEASE_TABLET | Freq: Two times a day (BID) | ORAL | Status: DC
Start: 2023-04-10 — End: 2023-04-19
  Administered 2023-04-10 – 2023-04-19 (×19): 40 mg via ORAL
  Filled 2023-04-10 (×19): qty 1

## 2023-04-10 MED ORDER — POTASSIUM CHLORIDE CRYS ER 20 MEQ PO TBCR
40.0000 meq | EXTENDED_RELEASE_TABLET | Freq: Once | ORAL | Status: AC
  Administered 2023-04-10: 40 meq via ORAL
  Filled 2023-04-10: qty 2

## 2023-04-10 MED ORDER — LIDOCAINE HCL (PF) 1 % IJ SOLN
10.0000 mL | Freq: Once | INTRAMUSCULAR | Status: AC
  Administered 2023-04-10: 10 mL via SUBCUTANEOUS

## 2023-04-10 MED ORDER — METHOCARBAMOL 500 MG PO TABS
500.0000 mg | ORAL_TABLET | Freq: Two times a day (BID) | ORAL | Status: DC
  Administered 2023-04-10 – 2023-04-19 (×19): 500 mg via ORAL
  Filled 2023-04-10 (×19): qty 1

## 2023-04-10 MED ORDER — ALBUMIN HUMAN 25 % IV SOLN
25.0000 g | Freq: Once | INTRAVENOUS | Status: AC | PRN
  Administered 2023-04-10: 25 g via INTRAVENOUS
  Filled 2023-04-10: qty 100

## 2023-04-10 MED ORDER — POTASSIUM PHOSPHATES 15 MMOLE/5ML IV SOLN
30.0000 mmol | Freq: Once | INTRAVENOUS | Status: AC
  Administered 2023-04-10: 30 mmol via INTRAVENOUS
  Filled 2023-04-10: qty 10

## 2023-04-10 MED ORDER — SUCRALFATE 1 GM/10ML PO SUSP
1.0000 g | Freq: Three times a day (TID) | ORAL | Status: DC
  Administered 2023-04-10 – 2023-04-18 (×28): 1 g via ORAL
  Filled 2023-04-10 (×39): qty 10

## 2023-04-10 MED ORDER — CEFTRIAXONE SODIUM 2 G IJ SOLR
2.0000 g | INTRAMUSCULAR | Status: AC
  Administered 2023-04-10 – 2023-04-16 (×7): 2 g via INTRAVENOUS
  Filled 2023-04-10 (×7): qty 20

## 2023-04-10 MED ORDER — BACITRACIN-POLYMYXIN B 500-10000 UNIT/GM OP OINT
1.0000 | TOPICAL_OINTMENT | Freq: Three times a day (TID) | OPHTHALMIC | Status: AC
  Administered 2023-04-10 – 2023-04-13 (×8): 1 via OPHTHALMIC
  Filled 2023-04-10 (×2): qty 3.5

## 2023-04-10 MED ORDER — FERROUS SULFATE 325 (65 FE) MG PO TABS
325.0000 mg | ORAL_TABLET | Freq: Every day | ORAL | Status: DC
  Administered 2023-04-10 – 2023-04-19 (×10): 325 mg via ORAL
  Filled 2023-04-10 (×10): qty 1

## 2023-04-10 MED ORDER — POTASSIUM CHLORIDE 10 MEQ/100ML IV SOLN
10.0000 meq | INTRAVENOUS | Status: AC
  Administered 2023-04-10 (×3): 10 meq via INTRAVENOUS
  Filled 2023-04-10 (×4): qty 100

## 2023-04-10 MED ORDER — METOCLOPRAMIDE HCL 5 MG/ML IJ SOLN
10.0000 mg | Freq: Once | INTRAMUSCULAR | Status: AC
  Administered 2023-04-10: 10 mg via INTRAVENOUS
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
## 2023-04-10 NOTE — ED Notes (Signed)
Patient taken to [place] for Paracentesis.

## 2023-04-10 NOTE — Consult Note (Addendum)
 Inpatient Consultation   Patient ID: Katie Stanley is a 37 y.o. female.  Requesting Provider: Elvan Sor, MD  Date of Admission: 04/09/2023  Date of Consult: 04/10/23   Reason for Consultation: diarrhea, abnormal CT   Patient's Chief Complaint:   Chief Complaint  Patient presents with   Chest Pain    37 year old Caucasian female with a history of alcohol abuse, self cleared HCV, chronic liver disease-MAFLD/MASH, pancreatic pseudocysts and chronic diarrhea who presents to the hospital for worsening abdominal distention and pain.  Patient had an upper respiratory infection this month and was taking ibuprofen .  She noticed over the last week that she developed worsening abdominal distention.  She has had decreased appetite.  No recent antibiotics.  She feels her pain is related to the tenseness of her abdomen with this new distention.  CT demonstrated large amount of ascites.  She has never undergone paracentesis before.  White count was elevated upon presentation as was her lactic acid.  CT demonstrated possible colonic inflammation.  Infectious stool workup was negative patient reports poor p.o. intake  Chronic diarrhea has unchanged in characteristic going 5-10 times of loose bowel moods per day without melena hematochezia.  She has nausea and vomiting but no hematemesis or coffee-ground emesis.  She denies any fever or chills.  Infectious workup negative.  Previous workup negative as the symptoms of diarrhea have been ongoing since at least January of this year.  Reports that she is cutting down on alcohol from 1/5 of liquor a day to 1 pint.  Her last drink was 3 days ago.  Denies NSAIDs, Anti-plt agents, and anticoagulants Denies family history of gastrointestinal disease and malignancy Previous Endoscopies: 09/2022- [facility]- 1 TA; random bx negative for microscopic colitis; normal appearing mucosa    Past Medical History:  Diagnosis Date   Alcohol abuse    Anxiety     Biliary calculi 12/07/2009   Cholelithiasis    Depression    Diet-controlled diabetes mellitus (HCC)    GERD (gastroesophageal reflux disease)    Hepatitis C    body self healed per patient   LVH (left ventricular hypertrophy)    a. 10/2022 Echo: EF 60-65%, no rwma, sev conc LVH, nl RV size/fxn, triv MR. Mobile density near ant MV leaflet- felt to be redundant chordae tendinae..   Morbid obesity (HCC)    Palpitations    a. 07/2019 Holter: Sinus rhythm, PVCs, rare PACs.  No significant arrhythmias.   Pancreatic pseudocyst    a. 02/2023 MRI abd: improving unilocular pancreatic pseudocyst.   Retained intrauterine contraceptive device (IUD) 01/20/2020   Substance abuse (HCC)     Past Surgical History:  Procedure Laterality Date   CHOLECYSTECTOMY     2011   COLONOSCOPY WITH PROPOFOL  N/A 09/20/2022   Procedure: COLONOSCOPY WITH PROPOFOL ;  Surgeon: Jinny Carmine, MD;  Location: [facility] ENDOSCOPY;  Service: Endoscopy;  Laterality: N/A;   ESOPHAGOGASTRODUODENOSCOPY N/A 09/29/2021   Procedure: ESOPHAGOGASTRODUODENOSCOPY (EGD);  Surgeon: Jinny Carmine, MD;  Location: [facility] ENDOSCOPY;  Service: Endoscopy;  Laterality: N/A;   ESOPHAGOGASTRODUODENOSCOPY (EGD) WITH PROPOFOL  N/A 01/21/2018   Procedure: ESOPHAGOGASTRODUODENOSCOPY (EGD) WITH Biopsy;  Surgeon: Jinny Carmine, MD;  Location: [facility];  Service: Endoscopy;  Laterality: N/A;   HYSTEROSCOPY WITH D & C N/A 01/20/2020   Procedure: DILATATION AND CURETTAGE /HYSTEROSCOPY;  Surgeon: Leonce Garnette BIRCH, MD;  Location: [facility] ORS;  Service: Gynecology;  Laterality: N/A;   INTRAUTERINE DEVICE (IUD) INSERTION N/A 01/20/2020   Procedure: INTRAUTERINE DEVICE (IUD) INSERTION  MIRENA  IUD;  Surgeon: Leonce Garnette BIRCH, MD;  Location: [facility];  Service: Gynecology;  Laterality: N/A;   IUD REMOVAL N/A 01/20/2020   Procedure: INTRAUTERINE DEVICE (IUD) REMOVAL;  Surgeon: Leonce Garnette BIRCH, MD;  Location: [facility] ORS;  Service: Gynecology;  Laterality: N/A;    PORT-A-CATH REMOVAL  02/08/2022   PORTA CATH INSERTION N/A 01/01/2019   Procedure: PORTA CATH INSERTION;  Surgeon: Marea Selinda RAMAN, MD;  Location: [facility] INVASIVE CV LAB;  Service: Cardiovascular;  Laterality: N/A;   VASCULAR SURGERY      Allergies  Allergen Reactions   Penicillins Swelling    Tolerated ceftriaxone  and cefepime  in past per [facility] and [facility] records    Family History  Problem Relation Age of Onset   Hypertension Mother    Diabetes Father    Hypertension Father    Liver cancer Maternal Grandmother    Hypertension Maternal Grandfather    Breast cancer Paternal Grandmother    Heart disease Paternal Grandfather     Social History   Tobacco Use   Smoking status: Every Day    Current packs/day: 1.00    Average packs/day: 1 pack/day for 12.0 years (12.0 ttl pk-yrs)    Types: Cigarettes   Smokeless tobacco: Never  Vaping Use   Vaping status: Never Used  Substance Use Topics   Alcohol use: Yes    Comment: less than a 5th a day.   Drug use: Yes    Types: Marijuana    Comment: 09/28/2021     Pertinent GI related history and allergies were reviewed with the patient  Review of Systems  Constitutional:  Positive for appetite change. Negative for activity change, chills, diaphoresis, fatigue, fever and unexpected weight change.  HENT:  Negative for trouble swallowing and voice change.   Respiratory:  Negative for shortness of breath and wheezing.   Cardiovascular:  Negative for chest pain, palpitations and leg swelling.  Gastrointestinal:  Positive for abdominal pain, diarrhea, nausea and vomiting. Negative for abdominal distention, anal bleeding, blood in stool, constipation and rectal pain.  Musculoskeletal:  Negative for arthralgias and myalgias.  Skin:  Negative for color change and pallor.  Neurological:  Negative for dizziness, syncope and weakness.  Psychiatric/Behavioral:  Negative for confusion.   All other systems reviewed and are negative.    Medications Home  Medications No current facility-administered medications on file prior to encounter.   Current Outpatient Medications on File Prior to Encounter  Medication Sig Dispense Refill   citalopram  (CELEXA ) 20 MG tablet TAKE 1.5 TABLET(20 MG) BY MOUTH DAILY 135 tablet 1   famotidine  (PEPCID ) 20 MG tablet Take 20 mg by mouth 2 (two) times daily.     folic acid  (FOLVITE ) 1 MG tablet Take 1 tablet (1 mg total) by mouth daily. 30 tablet 0   Iron , Ferrous Sulfate , 325 (65 Fe) MG TABS Take 325 mg by mouth daily. 30 tablet 5   lidocaine  (LIDODERM ) 5 % Place 1 patch onto the skin daily. Remove & Discard patch within 12 hours or as directed by MD 30 patch 0   MAGNESIUM  PO Take 1 each by mouth in the morning and at bedtime. OTC patient is unsure of the dost     methocarbamol  (ROBAXIN ) 500 MG tablet Take 1 tablet (500 mg total) by mouth 2 (two) times daily. 20 tablet 0   metoprolol  succinate (TOPROL -XL) 25 MG 24 hr tablet Take 1 tablet (25 mg total) by mouth daily. 90 tablet 1   Multiple Vitamin (MULTIVITAMIN WITH MINERALS)  TABS tablet Take 1 tablet by mouth daily. 30 tablet 0   pantoprazole  (PROTONIX ) 40 MG tablet Take 1 tablet (40 mg total) by mouth 2 (two) times daily before a meal. 60 tablet 0   potassium chloride  SA (KLOR-CON  M) 20 MEQ tablet TAKE 1 TABLET(20 MEQ) BY MOUTH DAILY 22 tablet 0   sucralfate  (CARAFATE ) 1 GM/10ML suspension Take 10 mLs (1 g total) by mouth 4 (four) times daily -  with meals and at bedtime. 420 mL 3   thiamine  (VITAMIN B-1) 100 MG tablet Take 1 tablet (100 mg total) by mouth daily. 30 tablet 0   doxycycline  (VIBRA -TABS) 100 MG tablet Take 1 tablet (100 mg total) by mouth 2 (two) times daily. (Patient not taking: Reported on 04/09/2023) 20 tablet 0   Pertinent GI related medications were reviewed with the patient  Inpatient Medications  Current Facility-Administered Medications:    0.9 %  sodium chloride  infusion, , Intravenous, Continuous, Niu, Xilin, MD, Last Rate: 125 mL/hr at  04/10/23 0305, New Bag at 04/10/23 0305   acetaminophen  (TYLENOL ) tablet 500 mg, 500 mg, Oral, TID PRN, Von Bellis, MD   albumin  human 25 % solution 25 g, 25 g, Intravenous, Once PRN, Onita Elspeth Sharper, DO   albuterol  (PROVENTIL ) (2.5 MG/3ML) 0.083% nebulizer solution 2.5 mg, 2.5 mg, Inhalation, Q4H PRN, Niu, Xilin, MD   aztreonam  (AZACTAM ) 2 g in sodium chloride  0.9 % 100 mL IVPB, 2 g, Intravenous, Q8H, Legrand Olam MATSU, RPH, Stopped at 04/10/23 9463   chlordiazePOXIDE  (LIBRIUM ) capsule 25 mg, 25 mg, Oral, TID, Niu, Xilin, MD, 25 mg at 04/09/23 2203   citalopram  (CELEXA ) tablet 20 mg, 20 mg, Oral, Daily, Niu, Xilin, MD   dextromethorphan-guaiFENesin  (MUCINEX  DM) 30-600 MG per 12 hr tablet 1 tablet, 1 tablet, Oral, BID PRN, Niu, Xilin, MD   ferrous sulfate  tablet 325 mg, 325 mg, Oral, Daily, Niu, Xilin, MD   folic acid  (FOLVITE ) tablet 1 mg, 1 mg, Oral, Daily, Niu, Xilin, MD, 1 mg at 04/09/23 2203   heparin  injection 5,000 Units, 5,000 Units, Subcutaneous, Q8H, Niu, Xilin, MD, 5,000 Units at 04/10/23 0502   hydrALAZINE  (APRESOLINE ) injection 5 mg, 5 mg, Intravenous, Q2H PRN, Niu, Xilin, MD   LORazepam  (ATIVAN ) injection 0-4 mg, 0-4 mg, Intravenous, Q6H, 1 mg at 04/10/23 0301 **FOLLOWED BY** [START ON 04/11/2023] LORazepam  (ATIVAN ) injection 0-4 mg, 0-4 mg, Intravenous, Q12H, Niu, Xilin, MD   LORazepam  (ATIVAN ) tablet 1-4 mg, 1-4 mg, Oral, Q1H PRN **OR** LORazepam  (ATIVAN ) injection 1-4 mg, 1-4 mg, Intravenous, Q1H PRN, Niu, Xilin, MD   methocarbamol  (ROBAXIN ) tablet 500 mg, 500 mg, Oral, BID, Niu, Xilin, MD   metroNIDAZOLE  (FLAGYL ) IVPB 500 mg, 500 mg, Intravenous, Q12H, Niu, Xilin, MD   morphine  (PF) 2 MG/ML injection 2 mg, 2 mg, Intravenous, Q4H PRN, Niu, Xilin, MD, 2 mg at 04/10/23 0401   multivitamin with minerals tablet 1 tablet, 1 tablet, Oral, Daily, Niu, Xilin, MD, 1 tablet at 04/09/23 2202   nicotine  (NICODERM CQ  - dosed in mg/24 hours) patch 21 mg, 21 mg, Transdermal, Daily, Niu,  Xilin, MD   ondansetron  (ZOFRAN ) injection 4 mg, 4 mg, Intravenous, Q8H PRN, Niu, Xilin, MD   oxyCODONE  (Oxy IR/ROXICODONE ) immediate release tablet 5 mg, 5 mg, Oral, Q6H PRN, Niu, Xilin, MD, 5 mg at 04/10/23 0517   pantoprazole  (PROTONIX ) EC tablet 40 mg, 40 mg, Oral, BID AC, Niu, Xilin, MD, 40 mg at 04/10/23 0736   sucralfate  (CARAFATE ) 1 GM/10ML suspension 1 g, 1 g, Oral, TID  WC & HS, Niu, Xilin, MD, 1 g at 04/10/23 9261   thiamine  (VITAMIN B1) tablet 100 mg, 100 mg, Oral, Daily, 100 mg at 04/09/23 2203 **OR** thiamine  (VITAMIN B1) injection 100 mg, 100 mg, Intravenous, Daily, Niu, Xilin, MD  Current Outpatient Medications:    citalopram  (CELEXA ) 20 MG tablet, TAKE 1.5 TABLET(20 MG) BY MOUTH DAILY, Disp: 135 tablet, Rfl: 1   famotidine  (PEPCID ) 20 MG tablet, Take 20 mg by mouth 2 (two) times daily., Disp: , Rfl:    folic acid  (FOLVITE ) 1 MG tablet, Take 1 tablet (1 mg total) by mouth daily., Disp: 30 tablet, Rfl: 0   Iron , Ferrous Sulfate , 325 (65 Fe) MG TABS, Take 325 mg by mouth daily., Disp: 30 tablet, Rfl: 5   lidocaine  (LIDODERM ) 5 %, Place 1 patch onto the skin daily. Remove & Discard patch within 12 hours or as directed by MD, Disp: 30 patch, Rfl: 0   MAGNESIUM  PO, Take 1 each by mouth in the morning and at bedtime. OTC patient is unsure of the dost, Disp: , Rfl:    methocarbamol  (ROBAXIN ) 500 MG tablet, Take 1 tablet (500 mg total) by mouth 2 (two) times daily., Disp: 20 tablet, Rfl: 0   metoprolol  succinate (TOPROL -XL) 25 MG 24 hr tablet, Take 1 tablet (25 mg total) by mouth daily., Disp: 90 tablet, Rfl: 1   Multiple Vitamin (MULTIVITAMIN WITH MINERALS) TABS tablet, Take 1 tablet by mouth daily., Disp: 30 tablet, Rfl: 0   pantoprazole  (PROTONIX ) 40 MG tablet, Take 1 tablet (40 mg total) by mouth 2 (two) times daily before a meal., Disp: 60 tablet, Rfl: 0   potassium chloride  SA (KLOR-CON  M) 20 MEQ tablet, TAKE 1 TABLET(20 MEQ) BY MOUTH DAILY, Disp: 22 tablet, Rfl: 0   sucralfate   (CARAFATE ) 1 GM/10ML suspension, Take 10 mLs (1 g total) by mouth 4 (four) times daily -  with meals and at bedtime., Disp: 420 mL, Rfl: 3   thiamine  (VITAMIN B-1) 100 MG tablet, Take 1 tablet (100 mg total) by mouth daily., Disp: 30 tablet, Rfl: 0   doxycycline  (VIBRA -TABS) 100 MG tablet, Take 1 tablet (100 mg total) by mouth 2 (two) times daily. (Patient not taking: Reported on 04/09/2023), Disp: 20 tablet, Rfl: 0  sodium chloride  125 mL/hr at 04/10/23 0305   albumin  human     aztreonam  Stopped (04/10/23 0536)   metronidazole       acetaminophen , albumin  human, albuterol , dextromethorphan-guaiFENesin , hydrALAZINE , LORazepam  **OR** LORazepam , morphine  injection, ondansetron  (ZOFRAN ) IV, oxyCODONE    Objective   Vitals:   04/10/23 0600 04/10/23 0700 04/10/23 0800 04/10/23 0821  BP: 117/74 (!) 146/74 132/72 (!) 151/93  Pulse: (!) 126 (!) 131 (!) 117 (!) 128  Resp: (!) 21 19 17    Temp:      TempSrc:      SpO2: 96% 97% 97%   Weight:      Height:         Physical Exam Vitals and nursing note reviewed.  Constitutional:      General: She is not in acute distress.    Appearance: She is ill-appearing. She is not toxic-appearing or diaphoretic.     Comments: Appears older than stated age  HENT:     Head: Normocephalic and atraumatic.     Nose: Nose normal.     Mouth/Throat:     Mouth: Mucous membranes are moist.     Pharynx: Oropharynx is clear.  Eyes:     General: Scleral icterus present.  Extraocular Movements: Extraocular movements intact.  Cardiovascular:     Rate and Rhythm: Regular rhythm. Tachycardia present.  Pulmonary:     Effort: Pulmonary effort is normal. No respiratory distress.     Breath sounds: Normal breath sounds. No wheezing, rhonchi or rales.  Abdominal:     General: Bowel sounds are normal. There is distension.     Tenderness: There is abdominal tenderness. There is guarding (voluntary; non peritoneal). There is no rebound.  Musculoskeletal:     Cervical  back: Neck supple.     Right lower leg: No edema.     Left lower leg: No edema.  Skin:    General: Skin is warm and dry.     Coloration: Skin is jaundiced. Skin is not pale.  Neurological:     General: No focal deficit present.     Mental Status: She is alert and oriented to person, place, and time. Mental status is at baseline.     Comments: Asterixis vs withdrawal tremor noted  Psychiatric:        Mood and Affect: Mood normal.        Behavior: Behavior normal.        Thought Content: Thought content normal.        Judgment: Judgment normal.     Laboratory Data Recent Labs  Lab 04/09/23 1746 04/10/23 0455  WBC 16.0* 14.3*  HGB 10.7* 9.7*  HCT 31.5* 28.1*  PLT 172 136*   Recent Labs  Lab 04/09/23 1144 04/09/23 1746 04/10/23 0455  NA 134*  --  132*  K 2.8*  --  3.0*  CL 91*  --  95*  CO2 16*  --  24  BUN <5*  --  5*  CALCIUM 8.1*  --  7.6*  PROT 7.4  --  6.4*  BILITOT 6.3* 6.5* 5.4*  ALKPHOS 386*  --  305*  ALT 38  --  36  AST 119*  --  103*  GLUCOSE 107*  --  104*   Recent Labs  Lab 04/09/23 1746  INR 1.3*    Recent Labs    04/09/23 1144  LIPASE 33        Imaging Studies: CT ABDOMEN PELVIS W CONTRAST Result Date: 04/09/2023 CLINICAL DATA:  Abdominal pain. EXAM: CT ABDOMEN AND PELVIS WITH CONTRAST TECHNIQUE: Multidetector CT imaging of the abdomen and pelvis was performed using the standard protocol following bolus administration of intravenous contrast. RADIATION DOSE REDUCTION: This exam was performed according to the departmental dose-optimization program which includes automated exposure control, adjustment of the mA and/or kV according to patient size and/or use of iterative reconstruction technique. CONTRAST:  75mL OMNIPAQUE  IOHEXOL  350 MG/ML SOLN COMPARISON:  10/27/2022. FINDINGS: Lower chest: Minimal dependent subsegmental atelectasis or scarring at the right base. Lung pericardial or pleural effusion. Hepatobiliary: Marked hepatic fatty  infiltration. No focal lesions identified. No biliary ductal dilatation. Gallbladder has been removed. Pancreas: Hypodense lesion in the uncinate process pancreas measures 1.8 cm, smaller than on the prior study presumably this represents a pseudocyst. No peripancreatic inflammatory changes currently. Spleen: Normal in size without focal abnormality. Adrenals/Urinary Tract: Adrenal glands are unremarkable. Kidneys are normal, without renal calculi, focal lesion, or hydronephrosis. Bladder is unremarkable. Stomach/Bowel: Gastric wall thickening which may be due to underdistention versus gastritis. Extensive inflammatory process involving small and large bowel with mucosal thickening. This could represent an infectious process or ischemia. No free air. There is large amount of ascites. No bowel dilatation to suggest obstruction. Appendix is unremarkable,  best visualized on coronal image 72. Vascular/Lymphatic: No significant vascular findings are present. No enlarged abdominal or pelvic lymph nodes. Reproductive: Uterus and bilateral adnexa are unremarkable. There is an IUD in place. Other: No abdominal wall hernia or abnormality. No abdominopelvic ascites. Musculoskeletal: No acute osseous abnormalities. There is spondylolysis at L5 on the right. IMPRESSION: 1. Extensive inflammatory process involving small and large bowel. This could represent an infectious or inflammatory process versus ischemia. 2. Large amount of ascites. 3. Fatty liver. 4. Pancreatic lesion, smaller than on the prior study, presumably a pseudocyst. Electronically Signed   By: Fonda Field M.D.   On: 04/09/2023 20:21   DG Chest 2 View Result Date: 04/09/2023 CLINICAL DATA:  Chest pain. EXAM: CHEST - 2 VIEW COMPARISON:  October 27, 2022. FINDINGS: The heart size and mediastinal contours are within normal limits. Both lungs are clear. Stable elevated right hemidiaphragm. The visualized skeletal structures are unremarkable. IMPRESSION: No  active cardiopulmonary disease. Electronically Signed   By: Lynwood Landy Raddle M.D.   On: 04/09/2023 12:17    Assessment:   # New abdominal ascites with abdominal pain - concern for sbp - concern that her liver disease has progressed to cirrhosis, however, etoh hepatitis which she currently has can also lead to ascites  # Chronic Diarrhea - unchanged from baseline - infectious w/u negative - s/p cholecystectomy  # Abnormal CT of GI tract - pt with both large and small bowel thickening on imaging. This has been present on previous imaging with no findings on interim endoscopic evaluation  - suspect 2/2 presence of ascites or developing portal colopathy given liver disease and persistent etoh use  # Alcoholic hepatitis - Mdf 22 - imaging revealing inflammation with fatty infiltration of liver  # multifactorial hepatic disease - obesity, etoh dependence, h/o hcv, mash, dm2 - hcv self cleared - INR 1.3, no encephalopathy- not liver failure  # EtOH dependence - previously drinking 1/5th per day. Now reports down to 1 pint  # Multiple electrolyte abns - suspect 2/2 etoh abuse and diarrhea - hyponatremia, hypokalemia, hypochloremia, hypomagnesemia  # thrombocytopenia  # Protein calorie malnutrition - 2/2 etoh dependence - albumin  2.4  # AKI  # lactic acidosis- resolving - likely 2/2 dehydration  # leukocytosis  # macrocytic anemia - 2/2 etoh use  # pancreatic pseudocyst- improving  Plan:  CT consistent with that in the past. No change in diarrheal sx from chronic diarrhea. Abdominal ascites to this severity is newer Portal Colopathy and ascites can give appearance of congested/edematous bowel that is not necessarily infectious/inflammatory in nature  Pt had colonoscopy 09/2022 with no findings aside from 1 polyp Infectious stool w/u negative this hospitalization Collect inflammatory markers.  Patient requires paracentesis to rule out SBP and for therapeutic relief  with studies Given the is her first paracentesis would not remove more than 5 L.  Patient to be given 25 g of albumin  Currently on antibiotics per primary team  Ascites can be related to current alcoholic hepatitis versus developing cirrhosis.  She was taking NSAIDs at home for recent upper respiratory infection which can also contribute as well as the patient with her AKI.  Maddrey Discriminant factor does not meet criteria for steroids Recommend high protein diet CIWA protocol and monitor for withdrawal Supportive care per primary team  No planned endoscopic evaluation at this time given recent eval. Monitor for other sources of infection if SBP negative - pt reports recent URI   Can consider bile acid sequestrant  for chronic diarrhea as the pt is s/p cholecystectomy I have reviewed her outpatient GI notes and endoscopy records  Management of other medical comorbidities as per primary team  I personally performed the service.  Thank you for allowing us  to participate in this patient's care. Please don't hesitate to call if any questions or concerns arise.   Elspeth Ozell Jungling, DO [facility]  Portions of the record may have been created with voice recognition software. Occasional wrong-word or 'sound-a-like' substitutions may have occurred due to the inherent limitations of voice recognition software.  Read the chart carefully and recognize, using context, where substitutions may have occurred.

## 2023-04-10 NOTE — Progress Notes (Signed)
 Pharmacy - Penicillin Allergy   Patient noted to have swelling documented to penicillin in chart.  At [facility] in 2023 she received cefepime  and ceftriaxone  and in July 2024 at [facility] received ceftriaxone  without known reaction.  Currently on aztreonam  and metronidazole  for small/large bowel inflammation.    Plan Discussed above findings with hospitalist and changed aztreonam  to ceftriaxone   Celestine Slovak, PharmD, BCPS, BCIDP Work Cell: [phone] 04/10/2023 8:49 AM

## 2023-04-10 NOTE — ED Notes (Signed)
Answered call light, pt IV pump is beeping, RN made aware.

## 2023-04-10 NOTE — Procedures (Signed)
 PROCEDURE SUMMARY:  Successful image-guided paracentesis from the right lateral abdomen.  Yielded 5.0 liters of clear yellow fluid.  No immediate complications.  EBL < 1 mL Patient tolerated well.   Specimen was sent for labs.  Please see imaging section of Epic for full dictation.  Clotilda DELENA Hesselbach PA-C 04/10/2023 11:14 AM

## 2023-04-10 NOTE — Progress Notes (Signed)
 PHARMACY CONSULT NOTE - FOLLOW UP  Pharmacy Consult for Electrolyte Monitoring and Replacement   Recent Labs: Potassium (mmol/L)  Date Value  04/10/2023 3.0 (L)   Magnesium  (mg/dL)  Date Value  87/68/7975 1.9   Calcium (mg/dL)  Date Value  87/68/7975 7.6 (L)   Albumin  (g/dL)  Date Value  87/68/7975 2.4 (L)  03/06/2023 3.1 (L)   Phosphorus (mg/dL)  Date Value  87/69/7975 3.0   Sodium (mmol/L)  Date Value  04/10/2023 132 (L)  03/06/2023 145 (H)   Assessment: Patient is a 37yo female with electrolyte disturbance. Pharmacy consulted for replacement. Patient received KCl 10mEq IV x 2 and Mag Sulfate 2g IV x 2 in the ED.  Goal of Therapy: Electrolytes within normal limits  Plan:  K 3.0: Give 40 mEq OP x1 No additional electrolyte replacement warranted for today Check BMP and Mg with AM labs  Thank you for involving pharmacy in this patient's care.   Damien Napoleon, PharmD Clinical Pharmacist 04/10/2023 7:27 AM

## 2023-04-10 NOTE — ED Notes (Signed)
Pt up to the bathroom at this time. Infusions paused.

## 2023-04-10 NOTE — ED Notes (Signed)
Patient ambulated to restroom with no difficulties

## 2023-04-10 NOTE — Progress Notes (Signed)
 [facility] Progress Note  Patient: Katie Stanley    FMW:[id number]  DOA: 04/09/2023     Date of Service: the patient was seen and examined on 04/10/2023  Chief Complaint  Patient presents with   Chest Pain   Brief hospital course: Katie Stanley is a 37 y.o. female with PMH of diet-controlled diabetes, chronic liver disease-MAFLD/MASH, self cleared HCV, HAV,   polysubstance abuse, tobacco abuse, alcohol abuse, GERD, depression with anxiety, anemia, obesity, who presents with nausea, vomiting, diarrhea, abdominal pain, abdominal distention.   Patient was recently hospitalized from 11/1 - 11/4 due to multiple electrolytes disturbance and diarrhea.  Patient had positive C. difficile antigen, but negative toxigenic C. difficile PCR.  Vancomycin  was not continued at discharge after discussing with GI.   Patient states that she has nausea, vomiting, diarrhea and abdominal pain for more than 1 week this time.  She has more than 10 times of nonbilious nonbloody vomiting and more than 10 times of watery diarrhea each day.  Her abdominal pain is diffuse, constant, cramping, nonradiating, not alleviated or aggravated by any known factors.  She states that her abdomen has been gradually distended recently. No fever or chills.  Patient has mild dry cough and mild shortness or breath.  She also reports chest discomfort earlier, which has really resolved.  Currently no active chest pain.  Denies symptoms of UTI.     Data reviewed independently and ED Course: pt was found to have WBC 6.0, magnesium  1.1, potassium 2.8, AKI with creatinine 1.56, BUN <5.0, and GFR 44 (recent creatinine 1.17 on 03/06/2023, and 0.64 on 06/24/2022), troponin level 17, lactic acid 6.2, INR 1.3, negative PCR for COVID, flu and RSV, abnormal liver function (ALP 386, AST 119, ALT 38, total bilirubin 6.3, direct bilirubin 0.5), lipase 33.  Chest x-ray negative.  Patient is admitted to telemetry bed as inpatient.  Dr. Desiderio of  general surgery is consulted.   CT abdomen/pelvis: 1. Extensive inflammatory process involving small and large bowel. This could represent an infectious or inflammatory process versus ischemia. 2. Large amount of ascites. 3. Fatty liver. 4. Pancreatic lesion, smaller than on the prior study, presumably a pseudocyst. 5.  Hepatobiliary: Marked hepatic fatty infiltration. No focal lesions identified. No biliary ductal dilatation.    Assessment and Plan:  # Acute gastroenteritis vs chronic diarrhea Patient presented with N/V/D and abdominal pain with distention CT scan shows inflammation of small and large intestine infectious versus inflammatory WBC and procalcitonin elevated Negative C. difficile and GI pathogen S/p IV fluid given in the ED Discontinued IV fluid due to significant ascites, vital signs stable. S/p vancomycin  given in the ED, discontinued aztreonam  continue ceftriaxone  and Flagyl    # Ascites, possible BPH Chronic liver disease may have progressed to liver cirrhosis Elevated WBC count Procalcitonin 2.0 elevated Continue ceftriaxone , Flagyl  S/p paracentesis 5 L fluid was tapped by IR on 12/31 S/p albumin  25 g IV one-time dose on 12/31 Follow fluid studies Follow GI for further recommendation  # Alcoholic hepatitis Mdf 22 CT shows inflammation with fatty infiltration of liver No need of prednisone  as per GI Monitor LFTs   # AKI On 11/01/22 Cr 0.83, gradually getting worse Cr 1.75 on admission 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 Avoid nephrotoxic medication, use renally dose medications Monitor renal function and urine output Daily  # Lactic acidosis most likely due to intravascular volume depletion Lactic acid 5.1, improved repeat lactic acid 1.8 s/p IVF  # EtOH use, abstinence counseling done  Last  drink was 3 days ago Continue CIWA protocol and watch for withdrawal symptoms Continue multivitamin, folate and thiamine   # History of polysubstance use,  UDS positive for THC and opiates Drug abuse abstinence counseling done  # GERD: Continue PPI and Carafate  # Depression, continued Celexa   # Hyponatremia due to volume overload Check serum osmolality # Hypokalemia, potassium repleted. # Hypophosphatemia, Phos repleted. # Hypomagnesemia, mag repleted. Monitor electrolytes and replete as needed.  # Macrocytic anemia and thrombocytopenia due to EtOH use Monitor H&H   Body mass index is 34.37 kg/m.  Interventions:  Diet: Full liquid diet DVT Prophylaxis: SCD, pharmacological prophylaxis contraindicated due to risk of bleeding    Advance goals of care discussion: Full code  Family Communication: family was not present at bedside, at the time of interview.  The pt provided permission to discuss medical plan with the family. Opportunity was given to ask question and all questions were answered satisfactorily.   Disposition:  Pt is from Home, admitted with ascites, abdominal distention, still has electrolyte imbalance, which precludes a safe discharge. Discharge to home, when stable, may need few days to improve.  Subjective: No significant events overnight, patient is complaining of generalized abdominal pain, no nausea vomiting, had 3-4 episodes of diarrhea today.  Denied any chest pain or palpitations, no shortness of breath.  Physical Exam: General: NAD, lying comfortably Appear in no distress, affect appropriate Eyes: PERRLA ENT: Oral Mucosa Clear, moist  Neck: no JVD,  Cardiovascular: S1 and S2 Present, no Murmur,  Respiratory: good respiratory effort, Bilateral Air entry equal and Decreased, no Crackles, no wheezes Abdomen: Distended due to ascites, mild generalized tenderness, BS + Skin: no rashes Extremities: 1-2+ pedal edema, no calf tenderness Neurologic: without any new focal findings Gait not checked due to patient safety concerns  Vitals:   04/10/23 1103 04/10/23 1210 04/10/23 1238 04/10/23 1400  BP: (!) 143/70  (!) 142/69  (!) 148/85  Pulse: (!) 127 (!) 118  (!) 132  Resp: (!) 23 (!) 23  13  Temp:   98.1 F (36.7 C)   TempSrc:   Oral   SpO2: 90% 93%  97%  Weight:      Height:        Intake/Output Summary (Last 24 hours) at 04/10/2023 1511 Last data filed at 04/10/2023 1105 Gross per 24 hour  Intake 2535.02 ml  Output --  Net 2535.02 ml   Filed Weights   04/09/23 1110 04/09/23 1113  Weight: 88.5 kg 88 kg    Data Reviewed: I have personally reviewed and interpreted daily labs, tele strips, imagings as discussed above. I reviewed all nursing notes, pharmacy notes, vitals, pertinent old records I have discussed plan of care as described above with RN and patient/family.  CBC: Recent Labs  Lab 04/09/23 1746 04/10/23 0455  WBC 16.0* 14.3*  NEUTROABS 13.3*  --   HGB 10.7* 9.7*  HCT 31.5* 28.1*  MCV 101.0* 103.3*  PLT 172 136*   Basic Metabolic Panel: Recent Labs  Lab 04/09/23 1144 04/09/23 1746 04/10/23 0455  NA 134*  --  132*  K 2.8*  --  3.0*  CL 91*  --  95*  CO2 16*  --  24  GLUCOSE 107*  --  104*  BUN <5*  --  5*  CREATININE 1.56*  --  1.75*  CALCIUM 8.1*  --  7.6*  MG  --  1.1* 1.9  PHOS  --  3.0  --     Studies: US  Paracentesis  Result Date: 04/10/2023 INDICATION: Patient with history of ETOH abuse, hepatitis C, fatty liver disease admitted with abdominal pain, nausea, vomiting and diarrhea. CT showed new onset ascites. Request for diagnostic and therapeutic paracentesis. EXAM: ULTRASOUND GUIDED DIAGNOSTIC AND THERAPEUTIC PARACENTESIS MEDICATIONS: 7 mL 1% lidocaine  COMPLICATIONS: None immediate. PROCEDURE: Informed written consent was obtained from the patient after a discussion of the risks, benefits and alternatives to treatment. A timeout was performed prior to the initiation of the procedure. Initial ultrasound scanning demonstrates a large amount of ascites within the right lower abdominal quadrant. The right lower abdomen was prepped and draped in the usual  sterile fashion. 1% lidocaine  was used for local anesthesia. Following this, a 19 gauge, 7-cm, Yueh catheter was introduced. An ultrasound image was saved for documentation purposes. The paracentesis was performed. The catheter was removed and a dressing was applied. The patient tolerated the procedure well without immediate post procedural complication. Patient received post-procedure intravenous albumin ; see nursing notes for details. FINDINGS: A total of approximately 5.0 L of clear yellow fluid was removed. Samples were sent to the laboratory as requested by the clinical team. IMPRESSION: Successful ultrasound-guided paracentesis yielding 5.0 liters of peritoneal fluid. Performed by Clotilda Hesselbach, PA-C Electronically Signed   By: Ester Sides M.D.   On: 04/10/2023 12:17   CT ABDOMEN PELVIS W CONTRAST Result Date: 04/09/2023 CLINICAL DATA:  Abdominal pain. EXAM: CT ABDOMEN AND PELVIS WITH CONTRAST TECHNIQUE: Multidetector CT imaging of the abdomen and pelvis was performed using the standard protocol following bolus administration of intravenous contrast. RADIATION DOSE REDUCTION: This exam was performed according to the departmental dose-optimization program which includes automated exposure control, adjustment of the mA and/or kV according to patient size and/or use of iterative reconstruction technique. CONTRAST:  75mL OMNIPAQUE  IOHEXOL  350 MG/ML SOLN COMPARISON:  10/27/2022. FINDINGS: Lower chest: Minimal dependent subsegmental atelectasis or scarring at the right base. Lung pericardial or pleural effusion. Hepatobiliary: Marked hepatic fatty infiltration. No focal lesions identified. No biliary ductal dilatation. Gallbladder has been removed. Pancreas: Hypodense lesion in the uncinate process pancreas measures 1.8 cm, smaller than on the prior study presumably this represents a pseudocyst. No peripancreatic inflammatory changes currently. Spleen: Normal in size without focal abnormality.  Adrenals/Urinary Tract: Adrenal glands are unremarkable. Kidneys are normal, without renal calculi, focal lesion, or hydronephrosis. Bladder is unremarkable. Stomach/Bowel: Gastric wall thickening which may be due to underdistention versus gastritis. Extensive inflammatory process involving small and large bowel with mucosal thickening. This could represent an infectious process or ischemia. No free air. There is large amount of ascites. No bowel dilatation to suggest obstruction. Appendix is unremarkable, best visualized on coronal image 72. Vascular/Lymphatic: No significant vascular findings are present. No enlarged abdominal or pelvic lymph nodes. Reproductive: Uterus and bilateral adnexa are unremarkable. There is an IUD in place. Other: No abdominal wall hernia or abnormality. No abdominopelvic ascites. Musculoskeletal: No acute osseous abnormalities. There is spondylolysis at L5 on the right. IMPRESSION: 1. Extensive inflammatory process involving small and large bowel. This could represent an infectious or inflammatory process versus ischemia. 2. Large amount of ascites. 3. Fatty liver. 4. Pancreatic lesion, smaller than on the prior study, presumably a pseudocyst. Electronically Signed   By: Fonda Field M.D.   On: 04/09/2023 20:21    Scheduled Meds:  bacitracin -polymyxin b   1 Application Both Eyes TID   chlordiazePOXIDE   25 mg Oral TID   citalopram   20 mg Oral Daily   ferrous sulfate   325 mg Oral Daily   folic  acid  1 mg Oral Daily   heparin   5,000 Units Subcutaneous Q8H   LORazepam   0-4 mg Intravenous Q6H   Followed by   NOREEN ON 04/11/2023] LORazepam   0-4 mg Intravenous Q12H   methocarbamol   500 mg Oral BID   multivitamin with minerals  1 tablet Oral Daily   nicotine   21 mg Transdermal Daily   pantoprazole   40 mg Oral BID AC   sucralfate   1 g Oral TID WC & HS   thiamine   100 mg Oral Daily   Or   thiamine   100 mg Intravenous Daily   Continuous Infusions:  sodium chloride  125 mL/hr  at 04/10/23 1154   cefTRIAXone  (ROCEPHIN )  IV Stopped (04/10/23 1421)   metronidazole  Stopped (04/10/23 1105)   PRN Meds: acetaminophen , albuterol , dextromethorphan-guaiFENesin , hydrALAZINE , LORazepam  **OR** LORazepam , morphine  injection, ondansetron  (ZOFRAN ) IV, oxyCODONE   Time spent: 55 minutes  Author: ELVAN SOR. MD [facility] 04/10/2023 3:11 PM  To reach On-call, see care teams to locate the attending and reach out to them via [email or web address]. If 7PM-7AM, please contact night-coverage If you still have difficulty reaching the attending provider, please page the [facility] (Director on Call) for [facility] on amion for assistance.

## 2023-04-10 NOTE — ED Notes (Addendum)
Patients HR increasing to 120's despite ativan and PRN pain medications. CIWA 4. Pain decreased to 7 from 9. Afebrile at 98.8. Patient denies chest pain. EKG captured and NP contacted.

## 2023-04-10 NOTE — ED Notes (Signed)
Pt continues to take off O2 and monitoring equipment.

## 2023-04-10 NOTE — ED Notes (Signed)
 Patient's O2 88-89% on room air, placed on 2L [place].

## 2023-04-11 DIAGNOSIS — R197 Diarrhea, unspecified: Secondary | ICD-10-CM | POA: Diagnosis not present

## 2023-04-11 DIAGNOSIS — R112 Nausea with vomiting, unspecified: Secondary | ICD-10-CM | POA: Diagnosis not present

## 2023-04-11 LAB — GLUCOSE, CAPILLARY: Glucose-Capillary: 136 mg/dL — ABNORMAL HIGH (ref 70–99)

## 2023-04-11 LAB — BASIC METABOLIC PANEL
Anion gap: 14 (ref 5–15)
Anion gap: 13 (ref 5–15)
BUN: 6 mg/dL (ref 6–20)
BUN: 6 mg/dL (ref 6–20)
CO2: 23 mmol/L (ref 22–32)
CO2: 23 mmol/L (ref 22–32)
Calcium: 7.7 mg/dL — ABNORMAL LOW (ref 8.9–10.3)
Calcium: 7.6 mg/dL — ABNORMAL LOW (ref 8.9–10.3)
Chloride: 98 mmol/L (ref 98–111)
Chloride: 98 mmol/L (ref 98–111)
Creatinine, Ser: 1.47 mg/dL — ABNORMAL HIGH (ref 0.44–1.00)
Creatinine, Ser: 1.49 mg/dL — ABNORMAL HIGH (ref 0.44–1.00)
GFR, Estimated: 47 mL/min — ABNORMAL LOW (ref >60)
GFR, Estimated: 46 mL/min — ABNORMAL LOW (ref >60)
Glucose, Bld: 126 mg/dL — ABNORMAL HIGH (ref 70–99)
Glucose, Bld: 137 mg/dL — ABNORMAL HIGH (ref 70–99)
Potassium: 3.2 mmol/L — ABNORMAL LOW (ref 3.5–5.1)
Potassium: 3.8 mmol/L (ref 3.5–5.1)
Sodium: 135 mmol/L (ref 135–145)
Sodium: 134 mmol/L — ABNORMAL LOW (ref 135–145)

## 2023-04-11 LAB — CBC
HCT: 28.3 % — ABNORMAL LOW (ref 36.0–46.0)
Hemoglobin: 9.6 g/dL — ABNORMAL LOW (ref 12.0–15.0)
MCH: 35.2 pg — ABNORMAL HIGH (ref 26.0–34.0)
MCHC: 33.9 g/dL (ref 30.0–36.0)
MCV: 103.7 fL — ABNORMAL HIGH (ref 80.0–100.0)
Platelets: 152 10*3/uL (ref 150–400)
RBC: 2.73 MIL/uL — ABNORMAL LOW (ref 3.87–5.11)
RDW: 16.2 % — ABNORMAL HIGH (ref 11.5–15.5)
WBC: 15.2 10*3/uL — ABNORMAL HIGH (ref 4.0–10.5)
nRBC: 0.2 % (ref 0.0–0.2)

## 2023-04-11 LAB — HEPATIC FUNCTION PANEL
ALT: 36 U/L (ref 0–44)
AST: 107 U/L — ABNORMAL HIGH (ref 15–41)
Albumin: 2.9 g/dL — ABNORMAL LOW (ref 3.5–5.0)
Alkaline Phosphatase: 296 U/L — ABNORMAL HIGH (ref 38–126)
Bilirubin, Direct: 2.3 mg/dL — ABNORMAL HIGH (ref 0.0–0.2)
Indirect Bilirubin: 1.9 mg/dL — ABNORMAL HIGH (ref 0.3–0.9)
Total Bilirubin: 4.2 mg/dL — ABNORMAL HIGH (ref 0.0–1.2)
Total Protein: 6.7 g/dL (ref 6.5–8.1)

## 2023-04-11 LAB — PHOSPHORUS
Phosphorus: <1 mg/dL — CL (ref 2.5–4.6)
Phosphorus: 1.7 mg/dL — ABNORMAL LOW (ref 2.5–4.6)

## 2023-04-11 LAB — MAGNESIUM
Magnesium: 1.6 mg/dL — ABNORMAL LOW (ref 1.7–2.4)
Magnesium: 2.1 mg/dL (ref 1.7–2.4)

## 2023-04-11 LAB — AMMONIA: Ammonia: 104 umol/L — ABNORMAL HIGH (ref 9–35)

## 2023-04-11 MED ORDER — CARVEDILOL 6.25 MG PO TABS
6.2500 mg | ORAL_TABLET | Freq: Two times a day (BID) | ORAL | Status: DC
  Administered 2023-04-11 – 2023-04-14 (×8): 6.25 mg via ORAL
  Filled 2023-04-11 (×8): qty 1

## 2023-04-11 MED ORDER — POTASSIUM CHLORIDE CRYS ER 20 MEQ PO TBCR
40.0000 meq | EXTENDED_RELEASE_TABLET | Freq: Once | ORAL | Status: AC
  Administered 2023-04-11: 40 meq via ORAL
  Filled 2023-04-11: qty 2

## 2023-04-11 MED ORDER — ENSURE ENLIVE PO LIQD
237.0000 mL | Freq: Three times a day (TID) | ORAL | Status: DC
  Administered 2023-04-11 – 2023-04-19 (×17): 237 mL via ORAL

## 2023-04-11 MED ORDER — ALPRAZOLAM 0.5 MG PO TABS
0.5000 mg | ORAL_TABLET | Freq: Three times a day (TID) | ORAL | Status: DC | PRN
  Administered 2023-04-11 – 2023-04-13 (×4): 0.5 mg via ORAL
  Filled 2023-04-11 (×4): qty 1

## 2023-04-11 MED ORDER — LACTULOSE 10 GM/15ML PO SOLN
30.0000 g | Freq: Two times a day (BID) | ORAL | Status: DC
  Administered 2023-04-11 – 2023-04-12 (×3): 30 g via ORAL
  Filled 2023-04-11 (×3): qty 60

## 2023-04-11 MED ORDER — MAGNESIUM SULFATE 2 GM/50ML IV SOLN
2.0000 g | Freq: Once | INTRAVENOUS | Status: AC
  Administered 2023-04-11: 2 g via INTRAVENOUS
  Filled 2023-04-11: qty 50

## 2023-04-11 MED ORDER — POTASSIUM PHOSPHATES 15 MMOLE/5ML IV SOLN
30.0000 mmol | Freq: Once | INTRAVENOUS | Status: AC
  Administered 2023-04-11: 30 mmol via INTRAVENOUS
  Filled 2023-04-11: qty 10

## 2023-04-11 MED ORDER — POTASSIUM & SODIUM PHOSPHATES 280-160-250 MG PO PACK
1.0000 | PACK | Freq: Once | ORAL | Status: AC
  Administered 2023-04-11: 1 via ORAL
  Filled 2023-04-11: qty 1

## 2023-04-11 NOTE — Progress Notes (Signed)
 PHARMACY CONSULT NOTE - FOLLOW UP  Pharmacy Consult for Electrolyte Monitoring and Replacement   Recent Labs: Potassium (mmol/L)  Date Value  04/11/2023 3.2 (L)   Magnesium  (mg/dL)  Date Value  98/98/7974 1.6 (L)   Calcium (mg/dL)  Date Value  98/98/7974 7.7 (L)   Albumin  (g/dL)  Date Value  98/98/7974 2.9 (L)  03/06/2023 3.1 (L)   Phosphorus (mg/dL)  Date Value  98/98/7974 <1.0 (LL)   Sodium (mmol/L)  Date Value  04/11/2023 135  03/06/2023 145 (H)   Assessment: Patient is a 38yo female with electrolyte disturbance. Pharmacy consulted for replacement. Patient received KCl 10mEq IV x 2 and Mag Sulfate 2g IV x 2 in the ED.  Goal of Therapy: Electrolytes within normal limits  Plan:  K 3.2, Phos<1.0: Give Kcl 40 mEq PO x1 and Kphos 30mmol IV x 1 Mag 1.6: Give Mag sulfate 2g IV x 1 Check BMP and Mg with AM labs  Thank you for involving pharmacy in this patient's care.   Tyrena Gohr A Roxine Whittinghill, PharmD Clinical Pharmacist 04/11/2023 7:39 AM

## 2023-04-11 NOTE — Progress Notes (Signed)
   04/11/23 0930 04/11/23 0932  Vitals  Temp  --  99.2 F (37.3 C)  BP (!) 157/98  --   MAP (mmHg) 115  --   BP Location Left Arm  --   BP Method Automatic  --   Patient Position (if appropriate) Lying  --   Pulse Rate (!) 140  --   Pulse Rate Source Monitor  --   Resp (!) 25  --   Level of Consciousness  Level of Consciousness Alert  --   MEWS COLOR  MEWS Score Color Red Red  Oxygen Therapy  SpO2 (!) 89 % 94 %  O2 Device Room Air (pt pulls off) Nasal Cannula  O2 Flow Rate (L/min)  --  2 L/min   Notified MD mews still red. Per MD continue to monitor

## 2023-04-11 NOTE — Progress Notes (Signed)
   04/11/23 1000  Spiritual Encounters  Type of Visit Initial  Care provided to: Patient  Conversation partners present during encounter Nurse  Referral source Nurse (RN/NT/LPN)  Reason for visit Advance directives  OnCall Visit Yes   Chaplain responded to [place] consult to provide information for Advance Directives. Information given, explained and patient knows to call Chaplain services when forms completed or has any questions.

## 2023-04-11 NOTE — TOC CM/SW Note (Signed)
 Transition of Care [facility]) - Inpatient Brief Assessment   Patient Details  Name: Katie Stanley MRN: [id number] Date of Birth: 1985-06-27  Transition of Care [facility]) CM/SW Contact:    Ladene Lady, LCSW Phone Number: 04/11/2023, 10:29 AM   Clinical Narrative: Pt admitted with nausea and vomiting presents with alcohol use. CSW has added substance use resources to AVS. TOC will follow for ongoing needs.   Transition of Care Asessment: Insurance and Status: Insurance coverage has been reviewed Patient has primary care physician: Yes Home environment has been reviewed: boyfriend and son Prior level of function:: independent Prior/Current Home Services: No current home services Social Drivers of Health Review: SDOH reviewed no interventions necessary Readmission risk has been reviewed: Yes Transition of care needs: no transition of care needs at this time

## 2023-04-11 NOTE — Progress Notes (Signed)
 [facility] Progress Note  Patient: Katie Stanley    FMW:[id number]  DOA: 04/09/2023     Date of Service: the patient was seen and examined on 04/11/2023  Chief Complaint  Patient presents with   Chest Pain   Brief hospital course: AIRIANA ELMAN is a 38 y.o. female with PMH of diet-controlled diabetes, chronic liver disease-MAFLD/MASH, self cleared HCV, HAV,   polysubstance abuse, tobacco abuse, alcohol abuse, GERD, depression with anxiety, anemia, obesity, who presents with nausea, vomiting, diarrhea, abdominal pain, abdominal distention.   Patient was recently hospitalized from 11/1 - 11/4 due to multiple electrolytes disturbance and diarrhea.  Patient had positive C. difficile antigen, but negative toxigenic C. difficile PCR.  Vancomycin  was not continued at discharge after discussing with GI.   Patient states that she has nausea, vomiting, diarrhea and abdominal pain for more than 1 week this time.  She has more than 10 times of nonbilious nonbloody vomiting and more than 10 times of watery diarrhea each day.  Her abdominal pain is diffuse, constant, cramping, nonradiating, not alleviated or aggravated by any known factors.  She states that her abdomen has been gradually distended recently. No fever or chills.  Patient has mild dry cough and mild shortness or breath.  She also reports chest discomfort earlier, which has really resolved.  Currently no active chest pain.  Denies symptoms of UTI.     Data reviewed independently and ED Course: pt was found to have WBC 6.0, magnesium  1.1, potassium 2.8, AKI with creatinine 1.56, BUN <5.0, and GFR 44 (recent creatinine 1.17 on 03/06/2023, and 0.64 on 06/24/2022), troponin level 17, lactic acid 6.2, INR 1.3, negative PCR for COVID, flu and RSV, abnormal liver function (ALP 386, AST 119, ALT 38, total bilirubin 6.3, direct bilirubin 0.5), lipase 33.  Chest x-ray negative.  Patient is admitted to telemetry bed as inpatient.  Dr. Desiderio of  general surgery is consulted.   CT abdomen/pelvis: 1. Extensive inflammatory process involving small and large bowel. This could represent an infectious or inflammatory process versus ischemia. 2. Large amount of ascites. 3. Fatty liver. 4. Pancreatic lesion, smaller than on the prior study, presumably a pseudocyst. 5.  Hepatobiliary: Marked hepatic fatty infiltration. No focal lesions identified. No biliary ductal dilatation.    Assessment and Plan:  # Acute gastroenteritis vs chronic diarrhea Patient presented with N/V/D and abdominal pain with distention CT scan shows inflammation of small and large intestine infectious versus inflammatory WBC and procalcitonin elevated Negative C. difficile and GI pathogen S/p IV fluid given in the ED Discontinued IV fluid due to significant ascites, vital signs stable. S/p vancomycin  given in the ED, discontinued aztreonam  continue ceftriaxone  and Flagyl    # Ascites, possible BPH Chronic liver disease may have progressed to liver cirrhosis Elevated WBC count Procalcitonin 2.0 elevated Continue ceftriaxone , Flagyl  S/p paracentesis 5 L fluid was tapped by IR on 12/31 S/p albumin  25 g IV one-time dose on 12/31 Fluid culture no growth, blood culture NGTD Follow GI for further recommendation  # Alcoholic hepatitis # Hyperbilirubinemia Mdf 22 CT shows inflammation with fatty infiltration of liver No need of prednisone  as per GI Monitor LFTs   # 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 Started lactulose , titrate to 2-3 BM per day Trend ammonia level   # AKI On 11/01/22 Cr 0.83, gradually getting worse Cr 1.75 on admission 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 Avoid nephrotoxic medication, use renally dose medications Monitor renal function and urine output Daily Cr 1.47   #  Lactic acidosis most likely due to intravascular volume depletion Lactic acid 5.1, improved repeat lactic acid 1.8 s/p IVF  # EtOH use, abstinence counseling done   Last drink was 3 days ago Continue CIWA protocol and watch for withdrawal symptoms Continue multivitamin, folate and thiamine   # History of polysubstance use, UDS positive for THC and opiates Drug abuse abstinence counseling done  # GERD: Continue PPI and Carafate  # Depression, continued Celexa   # Isotonic hyponatremia, most likely nutritional deficiency. Resolved  serum osmolality 285 wnl # Hypokalemia, potassium repleted. # Hypophosphatemia, Phos repleted. # Hypomagnesemia, mag repleted. Monitor electrolytes and replete as needed.  # Macrocytic anemia and thrombocytopenia due to EtOH use Monitor H&H   Body mass index is 34.37 kg/m.  Interventions:  Diet: Full liquid diet DVT Prophylaxis: SCD, pharmacological prophylaxis contraindicated due to risk of bleeding    Advance goals of care discussion: Full code  Family Communication: family was not present at bedside, at the time of interview.  The pt provided permission to discuss medical plan with the family. Opportunity was given to ask question and all questions were answered satisfactorily.   Disposition:  Pt is from Home, admitted with ascites, abdominal distention, still has electrolyte imbalance, which precludes a safe discharge. Discharge to home, when stable, may need few days to improve.  Subjective: No significant events overnight, patient was not very active, stated that she is having pain in the lower back in the kidney area bilaterally.also has abdominal tenderness at few points, overall poor historian.    Physical Exam: General: NAD, lying comfortably Appear in no distress, affect appropriate Eyes: PERRLA ENT: Oral Mucosa Clear, moist  Neck: no JVD,  Cardiovascular: S1 and S2 Present, no Murmur,  Respiratory: good respiratory effort, Bilateral Air entry equal and Decreased, no Crackles, no wheezes Abdomen: Distended due to ascites, mild generalized tenderness, BS + Skin: no rashes Extremities: 1-2+ pedal  edema, no calf tenderness Neurologic: without any new focal findings Gait not checked due to patient safety concerns  Vitals:   04/11/23 0829 04/11/23 0930 04/11/23 0932 04/11/23 1232  BP: (!) 152/116 (!) 157/98  117/72  Pulse: (!) 144 (!) 140  (!) 107  Resp:  (!) 25  20  Temp: 99.4 F (37.4 C)  99.2 F (37.3 C) 98.3 F (36.8 C)  TempSrc: Oral     SpO2: 96% (!) 89% 94% 91%  Weight:      Height:       No intake or output data in the 24 hours ending 04/11/23 1341  Filed Weights   04/09/23 1110 04/09/23 1113  Weight: 88.5 kg 88 kg    Data Reviewed: I have personally reviewed and interpreted daily labs, tele strips, imagings as discussed above. I reviewed all nursing notes, pharmacy notes, vitals, pertinent old records I have discussed plan of care as described above with RN and patient/family.  CBC: Recent Labs  Lab 04/09/23 1746 04/10/23 0455 04/11/23 0402  WBC 16.0* 14.3* 15.2*  NEUTROABS 13.3*  --   --   HGB 10.7* 9.7* 9.6*  HCT 31.5* 28.1* 28.3*  MCV 101.0* 103.3* 103.7*  PLT 172 136* 152   Basic Metabolic Panel: Recent Labs  Lab 04/09/23 1144 04/09/23 1746 04/10/23 0455 04/10/23 1433 04/11/23 0402  NA 134*  --  132* 135 135  K 2.8*  --  3.0* 3.0* 3.2*  CL 91*  --  95* 96* 98  CO2 16*  --  24 24 23   GLUCOSE 107*  --  104* 128* 126*  BUN <5*  --  5* 6 6  CREATININE 1.56*  --  1.75* 1.61* 1.47*  CALCIUM 8.1*  --  7.6* 7.6* 7.7*  MG  --  1.1* 1.9 1.9 1.6*  PHOS  --  3.0  --  1.2* <1.0*    Studies: No results found.   Scheduled Meds:  bacitracin -polymyxin b   1 Application Both Eyes TID   carvedilol   6.25 mg Oral BID WC   chlordiazePOXIDE   25 mg Oral TID   citalopram   20 mg Oral Daily   feeding supplement  237 mL Oral TID BM   ferrous sulfate   325 mg Oral Daily   folic acid   1 mg Oral Daily   heparin   5,000 Units Subcutaneous Q8H   lactulose   30 g Oral BID   LORazepam   0-4 mg Intravenous Q6H   Followed by   LORazepam   0-4 mg Intravenous Q12H    methocarbamol   500 mg Oral BID   multivitamin with minerals  1 tablet Oral Daily   nicotine   21 mg Transdermal Daily   pantoprazole   40 mg Oral BID AC   sucralfate   1 g Oral TID WC & HS   thiamine   100 mg Oral Daily   Or   thiamine   100 mg Intravenous Daily   Continuous Infusions:  cefTRIAXone  (ROCEPHIN )  IV Stopped (04/10/23 1421)   metronidazole  500 mg (04/11/23 0901)   potassium PHOSPHATE  30 mmol in dextrose  5 % 250 mL infusion 30 mmol (04/11/23 1004)   PRN Meds: acetaminophen , albuterol , ALPRAZolam , dextromethorphan-guaiFENesin , hydrALAZINE , LORazepam  **OR** LORazepam , morphine  injection, ondansetron  (ZOFRAN ) IV, oxyCODONE   Time spent: 55 minutes  Author: ELVAN SOR. MD [facility] 04/11/2023 1:41 PM  To reach On-call, see care teams to locate the attending and reach out to them via [email or web address]. If 7PM-7AM, please contact night-coverage If you still have difficulty reaching the attending provider, please page the [facility] (Director on Call) for [facility] on amion for assistance.

## 2023-04-11 NOTE — Progress Notes (Signed)
   04/11/23 0734  Assess: MEWS Score  Temp 99.1 F (37.3 C)  BP (!) 159/87  MAP (mmHg) 109  Pulse Rate (!) 141  Resp (!) 25  Level of Consciousness Alert  SpO2 96 %  O2 Device Nasal Cannula  Assess: MEWS Score  MEWS Temp 0  MEWS Systolic 0  MEWS Pulse 3  MEWS RR 1  MEWS LOC 0  MEWS Score 4  MEWS Score Color Red  Assess: if the MEWS score is Yellow or Red  Were vital signs accurate and taken at a resting state? Yes  Does the patient meet 2 or more of the SIRS criteria? Yes  Does the patient have a confirmed or suspected source of infection? No  MEWS guidelines implemented  Yes, red  Treat  MEWS Interventions Considered administering scheduled or prn medications/treatments as ordered  Take Vital Signs  Increase Vital Sign Frequency  Red: Q1hr x2, continue Q4hrs until patient remains green for 12hrs  Escalate  MEWS: Escalate Red: Discuss with charge nurse and notify provider. Consider notifying RRT. If remains red for 2 hours consider need for higher level of care  Notify: Charge Nurse/RN  Name of Charge Nurse/RN Notified brandi RN  Provider Notification  Provider Name/Title Von  Date Provider Notified 04/11/23  Time Provider Notified [phone]  Method of Notification Page  Notification Reason Other (Comment) (red mews)  Provider response See new orders  Date of Provider Response 04/11/23  Time of Provider Response 0815  Assess: SIRS CRITERIA  SIRS Temperature  0  SIRS Respirations  1  SIRS Pulse 1  SIRS WBC 0  SIRS Score Sum  2   Per MD give electrolytes replacements, ativan , coreg

## 2023-04-11 NOTE — Progress Notes (Signed)
 There was a consult for placing a PIV access last night. Patient's RN stated that patient has a PIV access now. No need a PIV access at this time. Informed patient's RN to put in the consult if patient need it. HS [employer]

## 2023-04-11 NOTE — Progress Notes (Signed)
   04/11/23 1627  Assess: MEWS Score  Temp 99.2 F (37.3 C)  BP 132/72  MAP (mmHg) 90  Pulse Rate (!) 113  Resp (!) 25  SpO2 94 %  O2 Device Room Air  Assess: MEWS Score  MEWS Temp 0  MEWS Systolic 0  MEWS Pulse 2  MEWS RR 1  MEWS LOC 0  MEWS Score 3  MEWS Score Color Yellow  Assess: if the MEWS score is Yellow or Red  Were vital signs accurate and taken at a resting state? Yes  Does the patient meet 2 or more of the SIRS criteria? Yes  Does the patient have a confirmed or suspected source of infection? No  MEWS guidelines implemented  Yes, yellow  Treat  MEWS Interventions Considered administering scheduled or prn medications/treatments as ordered  Take Vital Signs  Increase Vital Sign Frequency  Yellow: Q2hr x1, continue Q4hrs until patient remains green for 12hrs  Escalate  MEWS: Escalate Yellow: Discuss with charge nurse and consider notifying provider and/or RRT  Notify: Charge Nurse/RN  Name of Charge Nurse/RN Notified brandi  Provider Notification  Provider Name/Title Von  Date Provider Notified 04/11/23  Time Provider Notified 1638  Method of Notification Call  Notification Reason Other (Comment) (yellow mew)  Provider response Other (Comment) (give scheduled coreg  for HR)  Date of Provider Response 04/11/23  Time of Provider Response 1638  Assess: SIRS CRITERIA  SIRS Temperature  0  SIRS Respirations  1  SIRS Pulse 1  SIRS WBC 0  SIRS Score Sum  2

## 2023-04-11 NOTE — Discharge Instructions (Addendum)
 Intensive Outpatient Programs   [facility] [address] [place],  [place], [place] [phone]  [facility] (Inpatient and outpatient)[phone] (Suboxone  and Methadone) [address] [phone]  [facility] - Intensive Outpatient [address] [place], [place] [address]  [phone]  [facility] (Outpatient, Inpatient, [facility] (Groups and Residental) (insurance only) [phone] [place], [place] [phone]   [facility] (for caregivers and family) [address] [place] [address] [place], [place] [phone]  Residential Treatment Programs  [facility](2 years) Residential: 35 days)[facility] [address] [place], [place], [place] [phone] or [phone]  [facility] [address] [place], [place], [place] [phone]  [facility] [address] [place], [place] [address] [phone] Admissions: 8am-3pm M-F Nurse Navigator, Harla Mensch can be reached at [phone]      [facility] Program: Residential Program [phone] Days)             [facility]: [facility]  [place], [place], [place]  [phone] or [phone] in Hours over the weekend or by referral)  [facility] [address] [place], [place] [address] [phone] (Do virtual or phone assessment, offer transportation within 25 miles, have in patient and Outpatient  options)   [facility] Crisis: [facility]:[phone] (for crisis response 24 hours a day)

## 2023-04-11 NOTE — Progress Notes (Signed)
 Inpatient Follow-up/Progress Note   Patient ID: Katie Stanley is a 38 y.o. female.  Overnight Events / Subjective Findings Pt's abdominal pain improving after 5L removed from paracentesis. Pt appears to be actively withdrawing from EtOH. Had 1x large green BM today per nursing. Not having diarrheal sx as described at home. Afebrile. No further n/v. Tolerating PO.  No other acute gi complaints.  Review of Systems  Constitutional:  Negative for activity change, appetite change, chills, diaphoresis, fatigue, fever and unexpected weight change.  HENT:  Negative for trouble swallowing and voice change.   Respiratory:  Negative for shortness of breath and wheezing.   Cardiovascular:  Negative for chest pain, palpitations and leg swelling.  Gastrointestinal:  Positive for abdominal pain. Negative for abdominal distention, anal bleeding, blood in stool, constipation, diarrhea, nausea, rectal pain and vomiting.  Musculoskeletal:  Negative for arthralgias.  Skin:  Negative for color change and pallor.  Neurological:  Negative for dizziness, syncope and weakness.  Psychiatric/Behavioral:  Negative for agitation and confusion.   All other systems reviewed and are negative.    Medications  Current Facility-Administered Medications:    acetaminophen  (TYLENOL ) tablet 500 mg, 500 mg, Oral, TID PRN, Von Bellis, MD, 500 mg at 04/11/23 0348   albuterol  (PROVENTIL ) (2.5 MG/3ML) 0.083% nebulizer solution 2.5 mg, 2.5 mg, Inhalation, Q4H PRN, Niu, Xilin, MD   ALPRAZolam  (XANAX ) tablet 0.5 mg, 0.5 mg, Oral, TID PRN, Von Bellis, MD, 0.5 mg at 04/11/23 9160   bacitracin -polymyxin b  (POLYSPORIN ) ophthalmic ointment 1 Application, 1 Application, Both Eyes, TID, Von Bellis, MD, 1 Application at 04/10/23 2246   carvedilol  (COREG ) tablet 6.25 mg, 6.25 mg, Oral, BID WC, Von Bellis, MD, 6.25 mg at 04/11/23 9146   cefTRIAXone  (ROCEPHIN ) 2 g in sodium chloride  0.9 % 100 mL IVPB, 2 g, Intravenous, Q24H,  Von Bellis, MD, Stopped at 04/10/23 1421   chlordiazePOXIDE  (LIBRIUM ) capsule 25 mg, 25 mg, Oral, TID, Niu, Xilin, MD, 25 mg at 04/11/23 0840   citalopram  (CELEXA ) tablet 20 mg, 20 mg, Oral, Daily, Niu, Xilin, MD, 20 mg at 04/11/23 0840   dextromethorphan-guaiFENesin  (MUCINEX  DM) 30-600 MG per 12 hr tablet 1 tablet, 1 tablet, Oral, BID PRN, Niu, Xilin, MD   ferrous sulfate  tablet 325 mg, 325 mg, Oral, Daily, Niu, Xilin, MD, 325 mg at 04/11/23 9158   folic acid  (FOLVITE ) tablet 1 mg, 1 mg, Oral, Daily, Niu, Xilin, MD, 1 mg at 04/11/23 0841   heparin  injection 5,000 Units, 5,000 Units, Subcutaneous, Q8H, Niu, Xilin, MD, 5,000 Units at 04/11/23 9386   hydrALAZINE  (APRESOLINE ) injection 5 mg, 5 mg, Intravenous, Q2H PRN, Niu, Xilin, MD   lactulose  (CHRONULAC ) 10 GM/15ML solution 30 g, 30 g, Oral, BID, Von Bellis, MD, 30 g at 04/11/23 9146   LORazepam  (ATIVAN ) injection 0-4 mg, 0-4 mg, Intravenous, Q6H, 2 mg at 04/11/23 0333 **FOLLOWED BY** LORazepam  (ATIVAN ) injection 0-4 mg, 0-4 mg, Intravenous, Q12H, Niu, Xilin, MD   LORazepam  (ATIVAN ) tablet 1-4 mg, 1-4 mg, Oral, Q1H PRN, 1 mg at 04/10/23 1747 **OR** LORazepam  (ATIVAN ) injection 1-4 mg, 1-4 mg, Intravenous, Q1H PRN, Niu, Xilin, MD, 2 mg at 04/11/23 9372   magnesium  sulfate IVPB 2 g 50 mL, 2 g, Intravenous, Once, Windle Ling A, RPH, Last Rate: 50 mL/hr at 04/11/23 0846, 2 g at 04/11/23 0846   methocarbamol  (ROBAXIN ) tablet 500 mg, 500 mg, Oral, BID, Niu, Xilin, MD, 500 mg at 04/11/23 0841   metroNIDAZOLE  (FLAGYL ) IVPB 500 mg, 500 mg, Intravenous, Q12H, Niu, Xilin,  MD, Last Rate: 100 mL/hr at 04/11/23 0901, 500 mg at 04/11/23 0901   morphine  (PF) 2 MG/ML injection 2 mg, 2 mg, Intravenous, Q4H PRN, Niu, Xilin, MD, 2 mg at 04/11/23 0502   multivitamin with minerals tablet 1 tablet, 1 tablet, Oral, Daily, Niu, Xilin, MD, 1 tablet at 04/11/23 0840   nicotine  (NICODERM CQ  - dosed in mg/24 hours) patch 21 mg, 21 mg, Transdermal, Daily, Niu, Xilin, MD    ondansetron  (ZOFRAN ) injection 4 mg, 4 mg, Intravenous, Q8H PRN, Niu, Xilin, MD, 4 mg at 04/10/23 1003   oxyCODONE  (Oxy IR/ROXICODONE ) immediate release tablet 5 mg, 5 mg, Oral, Q6H PRN, Niu, Xilin, MD, 5 mg at 04/11/23 9095   pantoprazole  (PROTONIX ) EC tablet 40 mg, 40 mg, Oral, BID AC, Niu, Xilin, MD, 40 mg at 04/11/23 9157   potassium PHOSPHATE  30 mmol in dextrose  5 % 250 mL infusion, 30 mmol, Intravenous, Once, Nazari, Walid A, RPH   sucralfate  (CARAFATE ) 1 GM/10ML suspension 1 g, 1 g, Oral, TID WC & HS, Niu, Xilin, MD, 1 g at 04/11/23 9158   thiamine  (VITAMIN B1) tablet 100 mg, 100 mg, Oral, Daily, 100 mg at 04/11/23 0840 **OR** thiamine  (VITAMIN B1) injection 100 mg, 100 mg, Intravenous, Daily, Niu, Xilin, MD  cefTRIAXone  (ROCEPHIN )  IV Stopped (04/10/23 1421)   magnesium  sulfate bolus IVPB 2 g (04/11/23 0846)   metronidazole  500 mg (04/11/23 0901)   potassium PHOSPHATE  30 mmol in dextrose  5 % 250 mL infusion      acetaminophen , albuterol , ALPRAZolam , dextromethorphan-guaiFENesin , hydrALAZINE , LORazepam  **OR** LORazepam , morphine  injection, ondansetron  (ZOFRAN ) IV, oxyCODONE    Objective    Vitals:   04/11/23 0431 04/11/23 0623 04/11/23 0734 04/11/23 0829  BP: (!) 141/74 (!) 147/93 (!) 159/87 (!) 152/116  Pulse: (!) 119 (!) 127 (!) 141 (!) 144  Resp:   (!) 25   Temp:   99.1 F (37.3 C) 99.4 F (37.4 C)  TempSrc:    Oral  SpO2:   96% 96%  Weight:      Height:         Physical Exam Vitals and nursing note reviewed.  Constitutional:      General: She is not in acute distress.    Appearance: She is ill-appearing. She is not toxic-appearing or diaphoretic.     Comments: Appears older than stated age  HENT:     Head: Normocephalic and atraumatic.     Nose: Nose normal.     Mouth/Throat:     Mouth: Mucous membranes are moist.     Pharynx: Oropharynx is clear.  Eyes:     General: Scleral icterus present.     Extraocular Movements: Extraocular movements intact.      Comments: Periorbital edema  Cardiovascular:     Rate and Rhythm: Regular rhythm. Tachycardia present.  Pulmonary:     Effort: Pulmonary effort is normal. No respiratory distress.     Breath sounds: No wheezing, rhonchi or rales.  Abdominal:     General: Bowel sounds are normal. There is distension (improved. abdomen softer. non tender).     Palpations: Abdomen is soft.     Tenderness: There is no abdominal tenderness. There is no guarding or rebound.  Musculoskeletal:     Cervical back: Neck supple.     Right lower leg: No edema.     Left lower leg: No edema.  Skin:    General: Skin is warm and dry.     Coloration: Skin is jaundiced. Skin is not pale.  Neurological:     General: No focal deficit present.     Mental Status: She is alert and oriented to person, place, and time. Mental status is at baseline.     Comments: Asterixis and tremulousness present  Psychiatric:        Mood and Affect: Mood normal.        Behavior: Behavior normal.        Thought Content: Thought content normal.        Judgment: Judgment normal.      Laboratory Data Recent Labs  Lab 04/09/23 1746 04/10/23 0455 04/11/23 0402  WBC 16.0* 14.3* 15.2*  HGB 10.7* 9.7* 9.6*  HCT 31.5* 28.1* 28.3*  PLT 172 136* 152  NEUTOPHILPCT 81  --   --   LYMPHOPCT 8  --   --   MONOPCT 9  --   --   EOSPCT 0  --   --    Recent Labs  Lab 04/09/23 1144 04/09/23 1746 04/10/23 0455 04/10/23 1433 04/11/23 0402  NA 134*  --  132* 135 135  K 2.8*  --  3.0* 3.0* 3.2*  CL 91*  --  95* 96* 98  CO2 16*  --  24 24 23   BUN <5*  --  5* 6 6  CREATININE 1.56*  --  1.75* 1.61* 1.47*  CALCIUM 8.1*  --  7.6* 7.6* 7.7*  PROT 7.4  --  6.4*  --  6.7  BILITOT 6.3* 6.5* 5.4*  --  4.2*  ALKPHOS 386*  --  305*  --  296*  ALT 38  --  36  --  36  AST 119*  --  103*  --  107*  GLUCOSE 107*  --  104* 128* 126*   Recent Labs  Lab 04/09/23 1746  INR 1.3*      Imaging Studies: US  Paracentesis Result Date:  04/10/2023 INDICATION: Patient with history of ETOH abuse, hepatitis C, fatty liver disease admitted with abdominal pain, nausea, vomiting and diarrhea. CT showed new onset ascites. Request for diagnostic and therapeutic paracentesis. EXAM: ULTRASOUND GUIDED DIAGNOSTIC AND THERAPEUTIC PARACENTESIS MEDICATIONS: 7 mL 1% lidocaine  COMPLICATIONS: None immediate. PROCEDURE: Informed written consent was obtained from the patient after a discussion of the risks, benefits and alternatives to treatment. A timeout was performed prior to the initiation of the procedure. Initial ultrasound scanning demonstrates a large amount of ascites within the right lower abdominal quadrant. The right lower abdomen was prepped and draped in the usual sterile fashion. 1% lidocaine  was used for local anesthesia. Following this, a 19 gauge, 7-cm, Yueh catheter was introduced. An ultrasound image was saved for documentation purposes. The paracentesis was performed. The catheter was removed and a dressing was applied. The patient tolerated the procedure well without immediate post procedural complication. Patient received post-procedure intravenous albumin ; see nursing notes for details. FINDINGS: A total of approximately 5.0 L of clear yellow fluid was removed. Samples were sent to the laboratory as requested by the clinical team. IMPRESSION: Successful ultrasound-guided paracentesis yielding 5.0 liters of peritoneal fluid. Performed by Clotilda Hesselbach, PA-C Electronically Signed   By: Ester Sides M.D.   On: 04/10/2023 12:17   CT ABDOMEN PELVIS W CONTRAST Result Date: 04/09/2023 CLINICAL DATA:  Abdominal pain. EXAM: CT ABDOMEN AND PELVIS WITH CONTRAST TECHNIQUE: Multidetector CT imaging of the abdomen and pelvis was performed using the standard protocol following bolus administration of intravenous contrast. RADIATION DOSE REDUCTION: This exam was performed according to the departmental dose-optimization program which includes  automated exposure control, adjustment of the mA and/or kV according to patient size and/or use of iterative reconstruction technique. CONTRAST:  75mL OMNIPAQUE  IOHEXOL  350 MG/ML SOLN COMPARISON:  10/27/2022. FINDINGS: Lower chest: Minimal dependent subsegmental atelectasis or scarring at the right base. Lung pericardial or pleural effusion. Hepatobiliary: Marked hepatic fatty infiltration. No focal lesions identified. No biliary ductal dilatation. Gallbladder has been removed. Pancreas: Hypodense lesion in the uncinate process pancreas measures 1.8 cm, smaller than on the prior study presumably this represents a pseudocyst. No peripancreatic inflammatory changes currently. Spleen: Normal in size without focal abnormality. Adrenals/Urinary Tract: Adrenal glands are unremarkable. Kidneys are normal, without renal calculi, focal lesion, or hydronephrosis. Bladder is unremarkable. Stomach/Bowel: Gastric wall thickening which may be due to underdistention versus gastritis. Extensive inflammatory process involving small and large bowel with mucosal thickening. This could represent an infectious process or ischemia. No free air. There is large amount of ascites. No bowel dilatation to suggest obstruction. Appendix is unremarkable, best visualized on coronal image 72. Vascular/Lymphatic: No significant vascular findings are present. No enlarged abdominal or pelvic lymph nodes. Reproductive: Uterus and bilateral adnexa are unremarkable. There is an IUD in place. Other: No abdominal wall hernia or abnormality. No abdominopelvic ascites. Musculoskeletal: No acute osseous abnormalities. There is spondylolysis at L5 on the right. IMPRESSION: 1. Extensive inflammatory process involving small and large bowel. This could represent an infectious or inflammatory process versus ischemia. 2. Large amount of ascites. 3. Fatty liver. 4. Pancreatic lesion, smaller than on the prior study, presumably a pseudocyst. Electronically Signed    By: Fonda Field M.D.   On: 04/09/2023 20:21   DG Chest 2 View Result Date: 04/09/2023 CLINICAL DATA:  Chest pain. EXAM: CHEST - 2 VIEW COMPARISON:  October 27, 2022. FINDINGS: The heart size and mediastinal contours are within normal limits. Both lungs are clear. Stable elevated right hemidiaphragm. The visualized skeletal structures are unremarkable. IMPRESSION: No active cardiopulmonary disease. Electronically Signed   By: Lynwood Landy Raddle M.D.   On: 04/09/2023 12:17    Assessment:   # New abdominal ascites with abdominal pain -  studies supportive of portal htn- suspect relating to etoh hepatitis. Will need eval for cirrhosis once etoh hepatitis resolves - concern that her liver disease has progressed to cirrhosis, however, etoh hepatitis which she currently has can also lead to ascites - studies not supportive of SBP - she did receive flagyl  x1 and aztreonam  x1 prior to paracentesis - currently on abx per primary (rocephin  and flagyl )   # Chronic Diarrhea - unchanged from baseline- although what she describes at home has not been present in hospital - has actually been started on lactulose  in the hospital and not having as many as she reported at home (5-10 per day) - no melena/hematochezia - infectious w/u negative - s/p cholecystectomy   # Abnormal CT of GI tract - pt with both large and small bowel thickening on imaging. This has been present on previous imaging with no findings on interim endoscopic evaluation  - suspect 2/2 presence of ascites or developing portal colopathy given liver disease and persistent etoh use -Portal Colopathy and ascites can give appearance of congested/edematous bowel that is not necessarily infectious/inflammatory in nature    # Alcoholic hepatitis - Mdf 22 - imaging revealing inflammation with fatty infiltration of liver   # multifactorial hepatic disease - obesity, etoh dependence, h/o hcv, mash, dm2 - hcv self cleared - INR 1.3, no  encephalopathy- not liver failure   #  EtOH dependence - previously drinking 1/5th per day. Now reports down to 1 pint   # Multiple electrolyte abns - suspect 2/2 etoh abuse and diarrhea - hyponatremia, hypokalemia, hypochloremia, hypomagnesemia   # thrombocytopenia   # Protein calorie malnutrition - 2/2 etoh dependence - albumin  2.4   # AKI   # lactic acidosis- resolving - likely 2/2 dehydration   # leukocytosis   # macrocytic anemia - 2/2 etoh use   # pancreatic pseudocyst- improving  Plan:  Pt would benefit from repeat paracentesis in next 1 to 2 days 5L removed and negative for SBP- she did receive 1 dose each of aztrenoam and flagyl  prior to paracentesis. Thus far gram stain and cx negative. PMN only 49 For every 5L removed- administer 25 g (25%) albumin . Repeat culture, gram stain and cell count with every paracentesis Monitor CMP and INR daily PETH lab pending Fecal cal pending Sed rate nml. Crp elevated- non specific; pt had recent URI infection and increased ibuprofen  use Ascites can be related to current alcoholic hepatitis versus developing cirrhosis. She was taking NSAIDs at home for recent upper respiratory infection which can also contribute as well as the patient with her AKI.   CIWA Protocol Maddrey Discriminant factor does not meet criteria for steroids  Counseled on etoh cessation. 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 It is either not as severe as she is reporting or is etoh induced.  On lactulose  per primary and only had one large green BM. No signs of melena or hematochezia No need to trend ammonia- follow clinically. Goal for lactulose  will be 3 bm  per day minimum. Caution as multiple stools can further drive dehydration/aki and electrolyte abnormalities.  Suspect multiple sx from etoh withdrawal and etoh hepatitis  Will need repeat imaging in 6+ weeks to assess if cirrhosis present vs solely etoh hepatitis as etiology of sx  Supportive care per primary team including anti emetics. If no response to zofran , can use phenergan  or compazine  Discussed case with hospitalist and nursing  GI to sign off. Available as needed. Please do not hesitate to call regarding questions or concerns. Follow up with primary GI at [facility]  Management of other medical comorbidities as per primary team  I personally performed the service.  Thank you for allowing us  to participate in this patient's care.   Elspeth Ozell Jungling, DO [facility]  Portions of the record may have been created with voice recognition software. Occasional wrong-word or 'sound-a-like' substitutions may have occurred due to the inherent limitations of voice recognition software.  Read the chart carefully and recognize, using context, where substitutions may have occurred.

## 2023-04-11 NOTE — Plan of Care (Signed)
  Problem: Education: Goal: Knowledge of General Education information will improve Description Including pain rating scale, medication(s)/side effects and non-pharmacologic comfort measures Outcome: Progressing   Problem: Clinical Measurements: Goal: Will remain free from infection Outcome: Progressing   Problem: Nutrition: Goal: Adequate nutrition will be maintained Outcome: Progressing   Problem: Elimination: Goal: Will not experience complications related to urinary retention Outcome: Progressing   

## 2023-04-12 ENCOUNTER — Inpatient Hospital Stay (HOSPITAL_COMMUNITY)
Admit: 2023-04-12 | Discharge: 2023-04-12 | Disposition: A | Discharge status: Home / Self Care | Payer: Medicaid Other | Attending: Student | Admitting: Student

## 2023-04-12 ENCOUNTER — Inpatient Hospital Stay: Payer: Medicaid Other

## 2023-04-12 ENCOUNTER — Other Ambulatory Visit: Payer: Self-pay

## 2023-04-12 DIAGNOSIS — R079 Chest pain, unspecified: Secondary | ICD-10-CM

## 2023-04-12 DIAGNOSIS — R197 Diarrhea, unspecified: Secondary | ICD-10-CM | POA: Diagnosis not present

## 2023-04-12 DIAGNOSIS — Z7189 Other specified counseling: Secondary | ICD-10-CM | POA: Diagnosis not present

## 2023-04-12 DIAGNOSIS — R112 Nausea with vomiting, unspecified: Secondary | ICD-10-CM | POA: Diagnosis not present

## 2023-04-12 LAB — ECHOCARDIOGRAM COMPLETE
AR max vel: 2.61 cm2
AV Area VTI: 2.59 cm2
AV Area mean vel: 2.67 cm2
AV Mean grad: 6 mm[Hg]
AV Peak grad: 13.5 mm[Hg]
Ao pk vel: 1.84 m/s
Area-P 1/2: 6.96 cm2
Height: 63 in
MV VTI: 3.9 cm2
S' Lateral: 3.6 cm
Weight: 3174.62 [oz_av]

## 2023-04-12 LAB — COMPREHENSIVE METABOLIC PANEL
ALT: 42 U/L (ref 0–44)
AST: 100 U/L — ABNORMAL HIGH (ref 15–41)
Albumin: 3 g/dL — ABNORMAL LOW (ref 3.5–5.0)
Alkaline Phosphatase: 295 U/L — ABNORMAL HIGH (ref 38–126)
Anion gap: 14 (ref 5–15)
BUN: 7 mg/dL (ref 6–20)
CO2: 24 mmol/L (ref 22–32)
Calcium: 8.1 mg/dL — ABNORMAL LOW (ref 8.9–10.3)
Chloride: 97 mmol/L — ABNORMAL LOW (ref 98–111)
Creatinine, Ser: 1.25 mg/dL — ABNORMAL HIGH (ref 0.44–1.00)
GFR, Estimated: 57 mL/min — ABNORMAL LOW (ref >60)
Glucose, Bld: 125 mg/dL — ABNORMAL HIGH (ref 70–99)
Potassium: 3.8 mmol/L (ref 3.5–5.1)
Sodium: 135 mmol/L (ref 135–145)
Total Bilirubin: 3.5 mg/dL — ABNORMAL HIGH (ref 0.0–1.2)
Total Protein: 7.4 g/dL (ref 6.5–8.1)

## 2023-04-12 LAB — PHOSPHORUS: Phosphorus: 2 mg/dL — ABNORMAL LOW (ref 2.5–4.6)

## 2023-04-12 LAB — HEPATIC FUNCTION PANEL
ALT: 44 U/L (ref 0–44)
AST: 101 U/L — ABNORMAL HIGH (ref 15–41)
Albumin: 3.1 g/dL — ABNORMAL LOW (ref 3.5–5.0)
Alkaline Phosphatase: 302 U/L — ABNORMAL HIGH (ref 38–126)
Bilirubin, Direct: 1.8 mg/dL — ABNORMAL HIGH (ref 0.0–0.2)
Indirect Bilirubin: 1.5 mg/dL — ABNORMAL HIGH (ref 0.3–0.9)
Total Bilirubin: 3.3 mg/dL — ABNORMAL HIGH (ref 0.0–1.2)
Total Protein: 7.3 g/dL (ref 6.5–8.1)

## 2023-04-12 LAB — MRSA NEXT GEN BY PCR, NASAL: MRSA by PCR Next Gen: NOT DETECTED

## 2023-04-12 LAB — BLOOD GAS, ARTERIAL
Acid-Base Excess: 1.7 mmol/L (ref 0.0–2.0)
Bicarbonate: 28.2 mmol/L — ABNORMAL HIGH (ref 20.0–28.0)
O2 Content: 2 L/min
O2 Saturation: 94.8 %
Patient temperature: 37
pCO2 arterial: 51 mm[Hg] — ABNORMAL HIGH (ref 32–48)
pH, Arterial: 7.35 (ref 7.35–7.45)
pO2, Arterial: 69 mm[Hg] — ABNORMAL LOW (ref 83–108)

## 2023-04-12 LAB — CBC
HCT: 30.8 % — ABNORMAL LOW (ref 36.0–46.0)
Hemoglobin: 10.2 g/dL — ABNORMAL LOW (ref 12.0–15.0)
MCH: 34 pg (ref 26.0–34.0)
MCHC: 33.1 g/dL (ref 30.0–36.0)
MCV: 102.7 fL — ABNORMAL HIGH (ref 80.0–100.0)
Platelets: 150 10*3/uL (ref 150–400)
RBC: 3 MIL/uL — ABNORMAL LOW (ref 3.87–5.11)
RDW: 16.7 % — ABNORMAL HIGH (ref 11.5–15.5)
WBC: 8.9 10*3/uL (ref 4.0–10.5)
nRBC: 0.2 % (ref 0.0–0.2)

## 2023-04-12 LAB — BRAIN NATRIURETIC PEPTIDE: B Natriuretic Peptide: 150.8 pg/mL — ABNORMAL HIGH (ref 0.0–100.0)

## 2023-04-12 LAB — MAGNESIUM: Magnesium: 2.2 mg/dL (ref 1.7–2.4)

## 2023-04-12 LAB — GLUCOSE, CAPILLARY
Glucose-Capillary: 128 mg/dL — ABNORMAL HIGH (ref 70–99)
Glucose-Capillary: 129 mg/dL — ABNORMAL HIGH (ref 70–99)

## 2023-04-12 LAB — AMMONIA: Ammonia: 103 umol/L — ABNORMAL HIGH (ref 9–35)

## 2023-04-12 MED ORDER — POTASSIUM PHOSPHATES 15 MMOLE/5ML IV SOLN
30.0000 mmol | Freq: Once | INTRAVENOUS | Status: AC
  Administered 2023-04-12: 30 mmol via INTRAVENOUS
  Filled 2023-04-12: qty 10

## 2023-04-12 MED ORDER — METHYLPREDNISOLONE SODIUM SUCC 40 MG IJ SOLR
40.0000 mg | Freq: Two times a day (BID) | INTRAMUSCULAR | Status: AC
Start: 2023-04-12 — End: 2023-04-13
  Administered 2023-04-12 – 2023-04-13 (×4): 40 mg via INTRAVENOUS
  Filled 2023-04-12 (×4): qty 1

## 2023-04-12 MED ORDER — METHYLPREDNISOLONE SODIUM SUCC 40 MG IJ SOLR
40.0000 mg | Freq: Every day | INTRAMUSCULAR | Status: DC
  Filled 2023-04-12: qty 1

## 2023-04-12 MED ORDER — SODIUM CHLORIDE 0.9% FLUSH: 10.0000 mL | INTRAVENOUS | Status: DC | PRN

## 2023-04-12 MED ORDER — IPRATROPIUM-ALBUTEROL 0.5-2.5 (3) MG/3ML IN SOLN: 3.0000 mL | RESPIRATORY_TRACT | Status: DC | PRN

## 2023-04-12 MED ORDER — HYDROCOD POLI-CHLORPHE POLI ER 10-8 MG/5ML PO SUER
5.0000 mL | Freq: Two times a day (BID) | ORAL | Status: DC | PRN
  Administered 2023-04-12 – 2023-04-16 (×4): 5 mL via ORAL
  Filled 2023-04-12 (×4): qty 5

## 2023-04-12 MED ORDER — CHLORHEXIDINE GLUCONATE CLOTH 2 % EX PADS
6.0000 | MEDICATED_PAD | Freq: Every day | CUTANEOUS | Status: DC
Start: 2023-04-12 — End: 2023-04-13
  Administered 2023-04-12: 6 via TOPICAL

## 2023-04-12 MED ORDER — GUAIFENESIN ER 600 MG PO TB12
600.0000 mg | ORAL_TABLET | Freq: Two times a day (BID) | ORAL | Status: DC
  Administered 2023-04-12 – 2023-04-19 (×15): 600 mg via ORAL
  Filled 2023-04-12 (×15): qty 1

## 2023-04-12 MED ORDER — IPRATROPIUM-ALBUTEROL 0.5-2.5 (3) MG/3ML IN SOLN
3.0000 mL | Freq: Four times a day (QID) | RESPIRATORY_TRACT | Status: DC
  Administered 2023-04-12: 3 mL via RESPIRATORY_TRACT
  Filled 2023-04-12: qty 3

## 2023-04-12 MED ORDER — CHLORDIAZEPOXIDE HCL 5 MG PO CAPS
15.0000 mg | ORAL_CAPSULE | Freq: Three times a day (TID) | ORAL | Status: AC
  Administered 2023-04-13 (×3): 15 mg via ORAL
  Filled 2023-04-12 (×3): qty 3

## 2023-04-12 MED ORDER — FLUTICASONE FUROATE-VILANTEROL 200-25 MCG/ACT IN AEPB
1.0000 | INHALATION_SPRAY | Freq: Every day | RESPIRATORY_TRACT | Status: DC
  Administered 2023-04-13 – 2023-04-19 (×7): 1 via RESPIRATORY_TRACT
  Filled 2023-04-12 (×2): qty 28

## 2023-04-12 MED ORDER — LACTULOSE 10 GM/15ML PO SOLN
30.0000 g | Freq: Three times a day (TID) | ORAL | Status: DC
  Administered 2023-04-12 – 2023-04-14 (×6): 30 g via ORAL
  Filled 2023-04-12 (×6): qty 60

## 2023-04-12 MED ORDER — FUROSEMIDE 10 MG/ML IJ SOLN
40.0000 mg | Freq: Two times a day (BID) | INTRAMUSCULAR | Status: DC
  Administered 2023-04-12 – 2023-04-15 (×7): 40 mg via INTRAVENOUS
  Filled 2023-04-12 (×7): qty 4

## 2023-04-12 MED ORDER — SODIUM CHLORIDE 0.9% FLUSH
10.0000 mL | Freq: Two times a day (BID) | INTRAVENOUS | Status: DC
  Administered 2023-04-12 – 2023-04-15 (×6): 10 mL
  Administered 2023-04-15 – 2023-04-16 (×2): 20 mL
  Administered 2023-04-16 – 2023-04-18 (×5): 10 mL

## 2023-04-12 MED ORDER — CHLORDIAZEPOXIDE HCL 5 MG PO CAPS
10.0000 mg | ORAL_CAPSULE | Freq: Three times a day (TID) | ORAL | Status: AC
  Administered 2023-04-14 (×3): 10 mg via ORAL
  Filled 2023-04-12 (×3): qty 2

## 2023-04-12 NOTE — Progress Notes (Signed)
   04/12/23 0357  Assess: MEWS Score  Temp 98.4 F (36.9 C)  BP (!) 135/94  MAP (mmHg) 106  Pulse Rate (!) 116  Resp 19  SpO2 96 %  O2 Device Nasal Cannula  Assess: MEWS Score  MEWS Temp 0  MEWS Systolic 0  MEWS Pulse 2  MEWS RR 0  MEWS LOC 0  MEWS Score 2  MEWS Score Color Yellow  Assess: if the MEWS score is Yellow or Red  Were vital signs accurate and taken at a resting state? Yes  Does the patient meet 2 or more of the SIRS criteria? No  Does the patient have a confirmed or suspected source of infection? No  MEWS guidelines implemented  Yes, yellow  Treat  MEWS Interventions Considered administering scheduled or prn medications/treatments as ordered  Take Vital Signs  Increase Vital Sign Frequency  Yellow: Q2hr x1, continue Q4hrs until patient remains green for 12hrs  Escalate  MEWS: Escalate Yellow: Discuss with charge nurse and consider notifying provider and/or RRT  Notify: Charge Nurse/RN  Name of Charge Nurse/RN Notified Margherita, RN  Assess: SIRS CRITERIA  SIRS Temperature  0  SIRS Respirations  0  SIRS Pulse 1  SIRS WBC 0  SIRS Score Sum  1   Pt is detoxing from Alcohol.  About this time pt scored a 14 on CIWA scale and required 2 mg ativan .  Was hallucinating, anxious and having fullness in her head.  Upon reassessment, her CIWA score was a 9 and she was given another 1 mg ativan . Will continue to monitor.

## 2023-04-12 NOTE — Progress Notes (Signed)
*  PRELIMINARY RESULTS* Echocardiogram 2D Echocardiogram has been performed.  Carolyne Fiscal 04/12/2023, 1:03 PM

## 2023-04-12 NOTE — Progress Notes (Signed)
   04/12/23 0713  Vitals  Temp 98.7 F (37.1 C)  Temp Source Oral  BP (!) 152/96  MAP (mmHg) 113  BP Location Left Arm  BP Method Automatic  Patient Position (if appropriate) Lying  Pulse Rate (!) 122  Pulse Rate Source Dinamap  Resp (!) 25  Level of Consciousness  Level of Consciousness Alert  MEWS COLOR  MEWS Score Color Yellow  Oxygen Therapy  SpO2 94 %  O2 Device Nasal Cannula  O2 Flow Rate (L/min) 2 L/min  MEWS Score  MEWS Temp 0  MEWS Systolic 0  MEWS Pulse 2  MEWS RR 1  MEWS LOC 0  MEWS Score 3   Pt drowsy but responsive. Attempts to get out of bed. Uses accessory muscles with respirations.

## 2023-04-12 NOTE — Progress Notes (Signed)
 Report attempted x 1

## 2023-04-12 NOTE — Progress Notes (Addendum)
 Daily Progress Note   Patient Name: Katie Stanley       Date: 04/12/2023 DOB: 02/13/1986  Age: 38 y.o. MRN#: [id number] Attending Physician: Von Bellis, MD Primary Care Physician: Vicci Duwaine SQUIBB, DO Admit Date: 04/09/2023  Reason for Consultation/Follow-up: Establishing goals of care  Subjective: Notes and labs reviewed. In to see patient. She is resting in bed, no family at bedside. She awakens briefly and states fine when asked how she is doing, and fell back asleep. Per nursing, she has been confused.   During PMT's previous discussion, patient voiced desire for her significant other to be her surrogate decision maker. Per notes, chaplain followed up with patient regarding AD, but the forms were not completed.    PMT will follow.    Length of Stay: 3  Current Medications: Scheduled Meds:   bacitracin -polymyxin b   1 Application Both Eyes TID   carvedilol   6.25 mg Oral BID WC   chlordiazePOXIDE   25 mg Oral TID   citalopram   20 mg Oral Daily   feeding supplement  237 mL Oral TID BM   ferrous sulfate   325 mg Oral Daily   folic acid   1 mg Oral Daily   heparin   5,000 Units Subcutaneous Q8H   lactulose   30 g Oral BID   LORazepam   0-4 mg Intravenous Q12H   methocarbamol   500 mg Oral BID   multivitamin with minerals  1 tablet Oral Daily   nicotine   21 mg Transdermal Daily   pantoprazole   40 mg Oral BID AC   sucralfate   1 g Oral TID WC & HS   thiamine   100 mg Oral Daily   Or   thiamine   100 mg Intravenous Daily    Continuous Infusions:  cefTRIAXone  (ROCEPHIN )  IV Stopped (04/11/23 1640)   metronidazole  500 mg (04/12/23 0840)   potassium PHOSPHATE  30 mmol in dextrose  5 % 250 mL infusion      PRN Meds: acetaminophen , albuterol , ALPRAZolam , dextromethorphan-guaiFENesin ,  hydrALAZINE , LORazepam  **OR** LORazepam , morphine  injection, ondansetron  (ZOFRAN ) IV, oxyCODONE   Physical Exam Constitutional:      Comments: Opened eyes briefly.   Pulmonary:     Effort: Pulmonary effort is normal.             Vital Signs: BP (!) 142/89 (BP Location: Left Arm)   Pulse ROLLEN)  122   Temp 99.1 F (37.3 C) (Oral)   Resp 18   Ht 5' 3 (1.6 m)   Wt 88 kg   SpO2 93%   BMI 34.37 kg/m  SpO2: SpO2: 93 % O2 Device: O2 Device: Nasal Cannula O2 Flow Rate: O2 Flow Rate (L/min): 2 L/min  Intake/output summary:  Intake/Output Summary (Last 24 hours) at 04/12/2023 1012 Last data filed at 04/12/2023 9386 Gross per 24 hour  Intake 400.44 ml  Output --  Net 400.44 ml   LBM: Last BM Date : 04/11/23 Baseline Weight: Weight: 88.5 kg Most recent weight: Weight: 88 kg   Patient Active Problem List   Diagnosis Date Noted   Nausea vomiting and diarrhea 04/09/2023   Ascitic fluid 04/09/2023   Abnormal LFTs 04/09/2023   Alcoholic hepatitis without ascites 02/11/2023   Alcohol use disorder 02/11/2023   AKI (acute kidney injury) (HCC) 02/09/2023   Disorder of electrolytes 02/09/2023   Polysubstance abuse (HCC) 02/09/2023   Tobacco abuse 02/09/2023   Depression with anxiety 02/09/2023   Obesity (BMI 30-39.9) 02/09/2023   Hypomagnesemia 02/09/2023   Normocytic anemia 02/09/2023   GERD (gastroesophageal reflux disease) 02/09/2023   Hyponatremia 02/09/2023   HTN (hypertension) 02/09/2023   Abnormal liver function 02/09/2023   Hypophosphatemia 02/09/2023   Diabetes mellitus type 2 with complications (HCC) 02/05/2023   Hypokalemia 10/27/2022   Abdominal pain 10/27/2022   Polyp of transverse colon 09/20/2022   Hepatic steatosis 04/28/2022   Vitamin D  deficiency 04/28/2022   History of cardiac arrest 04/28/2022   Primary hypertension 05/16/2021   Alcohol abuse 12/31/2020   Paroxysmal tachycardia (HCC) 07/22/2019   Morbid obesity (HCC) 04/29/2018   Gastroesophageal reflux  disease 12/25/2017   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HCC) 05/01/2016   Anxiety 02/25/2015   Depression, recurrent (HCC) 02/25/2015   Chronic hepatitis C virus infection (HCC) 07/07/2009    Palliative Care Assessment & Plan    Recommendations/Plan: PMT will follow.  Continue CIWA  Code Status:    Code Status Orders  (From admission, onward)           Start     Ordered   04/09/23 2116  Full code  Continuous       Question:  By:  Answer:  Consent: discussion documented in EHR   04/09/23 2116           Code Status History     Date Active Date Inactive Code Status Order ID Comments User Context   02/09/2023 2234 02/12/2023 1740 Full Code [id number]  Hilma Rankins, MD ED   10/27/2022 1525 11/01/2022 1951 Full Code [id number]  Eldonna Elspeth PARAS, MD ED   06/21/2022 1718 06/24/2022 1804 Full Code [id number]  Leotis Bogus, MD ED   01/20/2020 1457 01/20/2020 2322 Full Code [id number]  Leonce Garnette BIRCH, MD Inpatient       Thank you for allowing the Palliative Medicine Team to assist in the care of this patient.    Camelia Lewis, NP  Please contact Palliative Medicine Team phone at [phone] for questions and concerns.

## 2023-04-12 NOTE — Consult Note (Signed)
 NAME:  Katie Stanley, MRN:  [id number], DOB:  Apr 07, 1986, LOS: 3 ADMISSION DATE:  04/09/2023, CONSULTATION DATE:  04/12/2023 REFERRING MD:  Dr. Von, CHIEF COMPLAINT:  ETOH withdrawal, Acute Respiratory Distress   Brief Pt Description / Synopsis:  38 y.o. female with past medical history significant for ETOH and polysubstance abuse admitted with Acute Gastroenteritis, Alcoholic Hepatitis, Chronic Liver disease with concern for progression to Cirrhosis, ascites, and hyperammonemia.  Course complicated by developing Alcohol Withdrawal and Acute Hypoxic Respiratory Failure.  History of Present Illness:  Katie Stanley is a 38 y.o. female with PMH of diet-controlled diabetes, chronic liver disease-MAFLD/MASH, self cleared HCV, HAV,   polysubstance abuse, tobacco abuse, alcohol abuse, GERD, depression with anxiety, anemia, obesity, who presented to [facility] ED on 04/09/23 with nausea, vomiting, diarrhea, abdominal pain, abdominal distention.   Pt is currently altered and unable to contribute to history and no family is currently available, therefore history is obtained from chart review.   Per H&P from [facility] patient stated that she has nausea, vomiting, diarrhea and abdominal pain for more than 1 week this time.  She has more than 10 times of nonbilious nonbloody vomiting and more than 10 times of watery diarrhea each day.  Her abdominal pain is diffuse, constant, cramping, nonradiating, not alleviated or aggravated by any known factors.  She states that her abdomen has been gradually distended recently. No fever or chills.  Patient has mild dry cough and mild shortness or breath.  She also reports chest discomfort earlier, which has really resolved.  Currently no active chest pain.  Denies symptoms of UTI.   Of note she was recently hospitalized from 11/1 - 11/4 due to multiple electrolytes disturbance and diarrhea.  Patient had positive C. difficile antigen, but negative toxigenic C. difficile  PCR.  Vancomycin  was not continued at discharge after discussing with GI.  ED Course: Initial Vital Signs: Significant Labs: WBC 6.0, magnesium  1.1, potassium 2.8, AKI with creatinine 1.56, BUN <5.0, and GFR 44 (recent creatinine 1.17 on 03/06/2023, and 0.64 on 06/24/2022), troponin level 17, lactic acid 6.2, INR 1.3, negative PCR for COVID, flu and RSV, abnormal liver function (ALP 386, AST 119, ALT 38, total bilirubin 6.3, direct bilirubin 0.5), lipase 33.  Imaging Chest X-ray>>negative CT Abdomen & Pelvis>>1. Extensive inflammatory process involving small and large bowel. This could represent an infectious or inflammatory process versus ischemia. 2. Large amount of ascites. 3. Fatty liver. 4. Pancreatic lesion, smaller than on the prior study, presumably a pseudocyst. 5.  Hepatobiliary: Marked hepatic fatty infiltration. No focal lesions identified. No biliary ductal dilatation.   She was admitted by the Hospitalists for further workup and treatment, with consults with General Surgery and GI.  Please see Significant Hospital Events section below for full detailed hospital course.   Pertinent  Medical History   Past Medical History:  Diagnosis Date   Alcohol abuse    Anxiety    Biliary calculi 12/07/2009   Cholelithiasis    Depression    Diet-controlled diabetes mellitus (HCC)    GERD (gastroesophageal reflux disease)    Hepatitis C    body self healed per patient   LVH (left ventricular hypertrophy)    a. 10/2022 Echo: EF 60-65%, no rwma, sev conc LVH, nl RV size/fxn, triv MR. Mobile density near ant MV leaflet- felt to be redundant chordae tendinae..   Morbid obesity (HCC)    Palpitations    a. 07/2019 Holter: Sinus rhythm, PVCs, rare PACs.  No significant arrhythmias.  Pancreatic pseudocyst    a. 02/2023 MRI abd: improving unilocular pancreatic pseudocyst.   Retained intrauterine contraceptive device (IUD) 01/20/2020   Substance abuse (HCC)     Micro Data:  12/30:  COVID-19/RSV/FLU PCR>>negative 12/30: C. Diff/GI panel PCR>>negative 12/30: Blood culture x2>> no growth to date 12/31: Peritoneal fluid from paracentesis>> no growth to date  Antimicrobials:   Anti-infectives (From admission, onward)    Start     Dose/Rate Route Frequency Ordered Stop   04/10/23 1400  cefTRIAXone  (ROCEPHIN ) 2 g in sodium chloride  0.9 % 100 mL IVPB        2 g 200 mL/hr over 30 Minutes Intravenous Every 24 hours 04/10/23 0845     04/10/23 0900  metroNIDAZOLE  (FLAGYL ) IVPB 500 mg        500 mg 100 mL/hr over 60 Minutes Intravenous Every 12 hours 04/09/23 2113     04/10/23 0600  aztreonam  (AZACTAM ) 2 g in sodium chloride  0.9 % 100 mL IVPB  Status:  Discontinued        2 g 200 mL/hr over 30 Minutes Intravenous Every 8 hours 04/09/23 2132 04/10/23 0845   04/09/23 2030  aztreonam  (AZACTAM ) 2 g in sodium chloride  0.9 % 100 mL IVPB        2 g 200 mL/hr over 30 Minutes Intravenous  Once 04/09/23 2016 04/09/23 2151   04/09/23 2030  metroNIDAZOLE  (FLAGYL ) IVPB 500 mg        500 mg 100 mL/hr over 60 Minutes Intravenous  Once 04/09/23 2016 04/09/23 2352   04/09/23 2030  vancomycin  (VANCOCIN ) IVPB 1000 mg/200 mL premix        1,000 mg 200 mL/hr over 60 Minutes Intravenous  Once 04/09/23 2016 04/10/23 0118       Significant Hospital Events: Including procedures, antibiotic start and stop dates in addition to other pertinent events   12/30: Presented to ED with N/V/D/Abdominal pain.  Admitted by Hospitalists 12/31: Underwent Paracentesis with 5 L fluid removed.  GI & Palliative Care consulted. 1/1: Late in the evening with hallucinations and anxiety concerning for developing ETOH withdrawal, also with increased WOB 1/2: Continues with increased WOB, PCCM consulted.  Transfer to [facility] given high risk for further decompensation.  Started on Lasix  and BD due to concern for Pulmonary Edema and AECOPD.  Interim History / Subjective:  -Pt seen on the floor -She is lethargic  (received multiple doses of Ativan  overnight for elevated CIWA score), but arouses easily and able to follow commands -PCCM consulted due to increased WOB ~ mildly tachypnea on 2L [place] ~ Chest X-ray without evidence of acute pulmonary process ~ don't feel that she needs intubation currently -Abdomen distended ~ may need repeat Paracentesis -Currently calm with CIWA, don't feel need to escalate to Librium  or Phenobarbital at this time  Objective   Blood pressure (!) 133/96, pulse (!) 124, temperature 98.2 F (36.8 C), resp. rate 19, height 5' 3 (1.6 m), weight 88 kg, SpO2 92%.        Intake/Output Summary (Last 24 hours) at 04/12/2023 0715 Last data filed at 04/12/2023 9386 Gross per 24 hour  Intake 400.44 ml  Output --  Net 400.44 ml   Filed Weights   04/09/23 1110 04/09/23 1113  Weight: 88.5 kg 88 kg    Examination: General: Acute on chronically ill appearing obese female, sitting in bed, with mild respiratory distress HENT: Atraumatic, normocephalic, neck supple, no JVD Lungs: Coarse breath sounds throughout, even, mild tachypnea and abdominal muscle use Cardiovascular:  Tachycardia, regular rhythm, s1s2, no M/G/R Abdomen: Obese, distended Extremities: Normal bulk and tone, no deformities, no edema, no cyanosis Neuro: Lethargic, arouses easily to voice, oriented to person and place, moves all extremities to commands, no focal deficits noted, pupils PERRL GU: Deferred  Resolved Hospital Problem list     Assessment & Plan:   #Acute Hypoxic Respiratory Failure, suspect due to AECOPD & questionable pulmonary edema #High risk for Aspiration -Supplemental O2 as needed to maintain O2 sats >92% -BiPAP if needed ~ High risk for Intubation -Follow intermittent Chest X-ray & ABG as needed -Bronchodilators  -IV Steroids -ABX as above -Diuresis as BP and renal function permits ~ started on 40 mg Lasix  BID -Pulmonary toilet as able  #Acute Metabolic Encephalopathy #Developing Alcohol  Withdrawal #Hyperammonemia  PMHx: ETOH abuse, polysubstance abuse -Treatment of metabolic derangements as outlined above -Provide supportive care -Promote normal sleep/wake cycle and family presence -Avoid sedating medications as able -CIWA protocol -Continue Lactulose   #Sepsis in the setting of Acute Gastroenteritis vs Chronic Diarrhea (Meets SIRS Criteria on 1/2: HR >90, RR >20) #High risk for aspiration -Monitor fever curve -Trend WBC's & Procalcitonin -Follow cultures as above -Continue empiric Ceftriaxone  and Flagyl   pending cultures & sensitivities  #Alcoholic Hepatitis #Multifactorial Chronic Liver Disease, with concern for progression to Cirrhosis #Ascites Status post paracentesis on 12/31, fluid and cultures not supportive of SBP -Trend LFT's and coags -GI following, appreciate input  #Acute Kidney Injury -Monitor I&O's / urinary output -Follow BMP -Ensure adequate renal perfusion -Avoid nephrotoxic agents as able -Replace electrolytes as indicated ~ Pharmacy following for assistance with electrolyte replacement      Best Practice (right click and Reselect all SmartList Selections daily)   Diet/type: NPO DVT prophylaxis: prophylactic heparin   GI prophylaxis: PPI Lines: N/A Foley:  N/A Code Status:  full code Last date of multidisciplinary goals of care discussion [1/2]  1/2: Pt updated at bedside on plan of care.  Labs   CBC: Recent Labs  Lab 04/09/23 1746 04/10/23 0455 04/11/23 0402  WBC 16.0* 14.3* 15.2*  NEUTROABS 13.3*  --   --   HGB 10.7* 9.7* 9.6*  HCT 31.5* 28.1* 28.3*  MCV 101.0* 103.3* 103.7*  PLT 172 136* 152    Basic Metabolic Panel: Recent Labs  Lab 04/09/23 1144 04/09/23 1746 04/10/23 0455 04/10/23 1433 04/11/23 0402 04/11/23 1700  NA 134*  --  132* 135 135 134*  K 2.8*  --  3.0* 3.0* 3.2* 3.8  CL 91*  --  95* 96* 98 98  CO2 16*  --  24 24 23 23   GLUCOSE 107*  --  104* 128* 126* 137*  BUN <5*  --  5* 6 6 6    CREATININE 1.56*  --  1.75* 1.61* 1.47* 1.49*  CALCIUM 8.1*  --  7.6* 7.6* 7.7* 7.6*  MG  --  1.1* 1.9 1.9 1.6* 2.1  PHOS  --  3.0  --  1.2* <1.0* 1.7*   GFR: Estimated Creatinine Clearance: 54.4 mL/min (A) (by C-G formula based on SCr of 1.49 mg/dL (H)). Recent Labs  Lab 04/09/23 1746 04/09/23 1941 04/09/23 2214 04/10/23 0455 04/10/23 0618 04/11/23 0402  PROCALCITON 2.07  --   --   --   --   --   WBC 16.0*  --   --  14.3*  --  15.2*  LATICACIDVEN  --  6.2* 5.1*  --  1.8  --     Liver Function Tests: Recent Labs  Lab 04/09/23 1144 04/09/23  1746 04/10/23 0455 04/11/23 0402  AST 119*  --  103* 107*  ALT 38  --  36 36  ALKPHOS 386*  --  305* 296*  BILITOT 6.3* 6.5* 5.4* 4.2*  PROT 7.4  --  6.4* 6.7  ALBUMIN  2.7*  --  2.4* 2.9*   Recent Labs  Lab 04/09/23 1144  LIPASE 33   Recent Labs  Lab 04/09/23 2214 04/10/23 0455 04/11/23 0402  AMMONIA 97* 95* 104*    ABG    Component Value Date/Time   HCO3 35.7 (H) 10/29/2022 1144   O2SAT 78.4 10/29/2022 1144     Coagulation Profile: Recent Labs  Lab 04/09/23 1746  INR 1.3*    Cardiac Enzymes: No results for input(s): CKTOTAL, CKMB, CKMBINDEX, TROPONINI in the last 168 hours.  HbA1C: HB A1C (BAYER DCA - WAIVED)  Date/Time Value Ref Range Status  02/05/2023 03:03 PM 6.1 (H) 4.8 - 5.6 % Final    Comment:             Prediabetes: 5.7 - 6.4          Diabetes: >6.4          Glycemic control for adults with diabetes: <7.0   10/01/2018 04:30 PM 5.3 <7.0 % Final    Comment:                                          Diabetic Adult            <7.0                                       Healthy Adult        4.3 - 5.7                                                           (DCCT/NGSP) American Diabetes Association's Summary of Glycemic Recommendations for Adults with Diabetes: Hemoglobin A1c <7.0%. More stringent glycemic goals (A1c <6.0%) may further reduce complications at the cost of increased risk of  hypoglycemia.    Hgb A1c MFr Bld  Date/Time Value Ref Range Status  10/27/2022 05:04 PM 6.6 (H) 4.8 - 5.6 % Final    Comment:    (NOTE)         Prediabetes: 5.7 - 6.4         Diabetes: >6.4         Glycemic control for adults with diabetes: <7.0   12/10/2019 02:17 PM 5.8 (H) 4.8 - 5.6 % Final    Comment:    (NOTE) Pre diabetes:          5.7%-6.4%  Diabetes:              >6.4%  Glycemic control for   <7.0% adults with diabetes     CBG: Recent Labs  Lab 04/11/23 0813  GLUCAP 136*    Review of Systems:   Unable to assess due to AMS  Past Medical History:  She,  has a past medical history of Alcohol abuse, Anxiety, Biliary calculi (12/07/2009), Cholelithiasis, Depression, Diet-controlled diabetes mellitus (HCC), GERD (gastroesophageal reflux  disease), Hepatitis C, LVH (left ventricular hypertrophy), Morbid obesity (HCC), Palpitations, Pancreatic pseudocyst, Retained intrauterine contraceptive device (IUD) (01/20/2020), and Substance abuse (HCC).   Surgical History:   Past Surgical History:  Procedure Laterality Date   CHOLECYSTECTOMY     2011   COLONOSCOPY WITH PROPOFOL  N/A 09/20/2022   Procedure: COLONOSCOPY WITH PROPOFOL ;  Surgeon: Jinny Carmine, MD;  Location: [facility] ENDOSCOPY;  Service: Endoscopy;  Laterality: N/A;   ESOPHAGOGASTRODUODENOSCOPY N/A 09/29/2021   Procedure: ESOPHAGOGASTRODUODENOSCOPY (EGD);  Surgeon: Jinny Carmine, MD;  Location: [facility] ENDOSCOPY;  Service: Endoscopy;  Laterality: N/A;   ESOPHAGOGASTRODUODENOSCOPY (EGD) WITH PROPOFOL  N/A 01/21/2018   Procedure: ESOPHAGOGASTRODUODENOSCOPY (EGD) WITH Biopsy;  Surgeon: Jinny Carmine, MD;  Location: [facility];  Service: Endoscopy;  Laterality: N/A;   HYSTEROSCOPY WITH D & C N/A 01/20/2020   Procedure: DILATATION AND CURETTAGE /HYSTEROSCOPY;  Surgeon: Leonce Garnette BIRCH, MD;  Location: [facility] ORS;  Service: Gynecology;  Laterality: N/A;   INTRAUTERINE DEVICE (IUD) INSERTION N/A 01/20/2020   Procedure:  INTRAUTERINE DEVICE (IUD) INSERTION MIRENA  IUD;  Surgeon: Leonce Garnette BIRCH, MD;  Location: [facility] ORS;  Service: Gynecology;  Laterality: N/A;   IUD REMOVAL N/A 01/20/2020   Procedure: INTRAUTERINE DEVICE (IUD) REMOVAL;  Surgeon: Leonce Garnette BIRCH, MD;  Location: [facility] ORS;  Service: Gynecology;  Laterality: N/A;   PORT-A-CATH REMOVAL  02/08/2022   PORTA CATH INSERTION N/A 01/01/2019   Procedure: PORTA CATH INSERTION;  Surgeon: Marea Selinda RAMAN, MD;  Location: [facility] INVASIVE CV LAB;  Service: Cardiovascular;  Laterality: N/A;   VASCULAR SURGERY       Social History:   reports that she has been smoking cigarettes. She has a 12 pack-year smoking history. She has never used smokeless tobacco. She reports current alcohol use. She reports current drug use. Drug: Marijuana.   Family History:  Her family history includes Breast cancer in her paternal grandmother; Diabetes in her father; Heart disease in her paternal grandfather; Hypertension in her father, maternal grandfather, and mother; Liver cancer in her maternal grandmother.   Allergies Allergies  Allergen Reactions   Penicillins Swelling    Tolerated ceftriaxone  and cefepime  in past per [facility] and [facility] records     Home Medications  Prior to Admission medications   Medication Sig Start Date End Date Taking? Authorizing Provider  citalopram  (CELEXA ) 20 MG tablet TAKE 1.5 TABLET(20 MG) BY MOUTH DAILY 02/20/23  Yes Johnson, Megan P, DO  famotidine  (PEPCID ) 20 MG tablet Take 20 mg by mouth 2 (two) times daily.   Yes [provider]  folic acid  (FOLVITE ) 1 MG tablet Take 1 tablet (1 mg total) by mouth daily. 02/13/23  Yes Regalado, Belkys A, MD  Iron , Ferrous Sulfate , 325 (65 Fe) MG TABS Take 325 mg by mouth daily. 03/07/23  Yes Johnson, Megan P, DO  lidocaine  (LIDODERM ) 5 % Place 1 patch onto the skin daily. Remove & Discard patch within 12 hours or as directed by MD 04/28/22  Yes Johnson, Megan P, DO  MAGNESIUM  PO Take 1 each by mouth in the  morning and at bedtime. OTC patient is unsure of the dost   Yes [provider]  methocarbamol  (ROBAXIN ) 500 MG tablet Take 1 tablet (500 mg total) by mouth 2 (two) times daily. 10/20/22  Yes Brimage, Vondra, DO  metoprolol  succinate (TOPROL -XL) 25 MG 24 hr tablet Take 1 tablet (25 mg total) by mouth daily. 02/20/23  Yes Johnson, Megan P, DO  Multiple Vitamin (MULTIVITAMIN WITH MINERALS) TABS tablet Take 1 tablet by mouth daily.  02/13/23  Yes Regalado, Belkys A, MD  pantoprazole  (PROTONIX ) 40 MG tablet Take 1 tablet (40 mg total) by mouth 2 (two) times daily before a meal. 02/20/23 04/09/23 Yes Johnson, Megan P, DO  potassium chloride  SA (KLOR-CON  M) 20 MEQ tablet TAKE 1 TABLET(20 MEQ) BY MOUTH DAILY 03/20/23  Yes Johnson, Megan P, DO  sucralfate  (CARAFATE ) 1 GM/10ML suspension Take 10 mLs (1 g total) by mouth 4 (four) times daily -  with meals and at bedtime. 08/17/22  Yes Johnson, Megan P, DO  thiamine  (VITAMIN B-1) 100 MG tablet Take 1 tablet (100 mg total) by mouth daily. 02/20/23  Yes Johnson, Megan P, DO  doxycycline  (VIBRA -TABS) 100 MG tablet Take 1 tablet (100 mg total) by mouth 2 (two) times daily. Patient not taking: Reported on 04/09/2023 02/26/23   Vicci Duwaine SQUIBB, DO     Critical care time: 55 minutes     Inge Lecher, AGACNP-BC [facility] Prefer epic messenger for cross cover needs If after hours, please call E-link

## 2023-04-12 NOTE — Progress Notes (Signed)
 Attempted calling family both mother& friend for PICC insertion but no answer. Will call again later.

## 2023-04-12 NOTE — Progress Notes (Addendum)
 Peripherally Inserted Central Catheter Placement  The IV Nurse has discussed with the patient and/or persons authorized to consent for the patient, the purpose of this procedure and the potential benefits and risks involved with this procedure.  The benefits include less needle sticks, lab draws from the catheter, and the patient may be discharged home with the catheter. Risks include, but not limited to, infection, bleeding, blood clot (thrombus formation), and puncture of an artery; nerve damage and irregular heartbeat and possibility to perform a PICC exchange if needed/ordered by physician.  Alternatives to this procedure were also discussed.  Bard Power PICC patient education guide, fact sheet on infection prevention and patient information card has been provided to patient /or left at bedside.    PICC Placement Documentation  PICC Double Lumen 04/12/23 Left Brachial 46 cm 0 cm (Active)  Indication for Insertion or Continuance of Line Poor Vasculature-patient has had multiple peripheral attempts or PIVs lasting less than 24 hours 04/12/23 1540  Exposed Catheter (cm) 0 cm 04/12/23 1540  Site Assessment Clean, Dry, Intact 04/12/23 1540  Lumen #1 Status Flushed;Blood return noted;Saline locked 04/12/23 1540  Lumen #2 Status Flushed;Blood return noted;Saline locked 04/12/23 1540  Dressing Type Transparent 04/12/23 1540  Dressing Status Antimicrobial disc in place 04/12/23 1540  Line Care Connections checked and tightened 04/12/23 1540  Dressing Intervention New dressing 04/12/23 1540  Dressing Change Due 04/19/23 04/12/23 1540   Big bruised noted in right upper arm    Katie Stanley 04/12/2023, 4:01 PM

## 2023-04-12 NOTE — Plan of Care (Signed)
 At initial assessment and med pass, pt scored a 0 on the CIWA scale.  She was A&O x4, appreciative of care.  Later in the shift, another RN assisted with pt care and said that pt was hallucinating (seeing bugs on the wall), had a fullness in the head, and was generally anxious.  I asked her to do a CIWA assessment and she scored pt a 14 and then gave her 2 mg ativan .  When the RN did a reassessment in an hour, pt scored a 9, and I gave her another 1 mg ativan .  Respiratory was called to evaluate b/c of the pt's labored breathing, but another RN who had cared for pt the night before believed there was no change.  We will continue to monitor.

## 2023-04-12 NOTE — Progress Notes (Signed)
 [facility] Progress Note  Patient: Katie Stanley    FMW:[id number]  DOA: 04/09/2023     Date of Service: the patient was seen and examined on 04/12/2023  Chief Complaint  Patient presents with   Chest Pain   Brief hospital course: Katie Stanley is a 38 y.o. female with PMH of diet-controlled diabetes, chronic liver disease-MAFLD/MASH, self cleared HCV, HAV,   polysubstance abuse, tobacco abuse, alcohol abuse, GERD, depression with anxiety, anemia, obesity, who presents with nausea, vomiting, diarrhea, abdominal pain, abdominal distention.   Patient was recently hospitalized from 11/1 - 11/4 due to multiple electrolytes disturbance and diarrhea.  Patient had positive C. difficile antigen, but negative toxigenic C. difficile PCR.  Vancomycin  was not continued at discharge after discussing with GI.   Patient states that she has nausea, vomiting, diarrhea and abdominal pain for more than 1 week this time.  She has more than 10 times of nonbilious nonbloody vomiting and more than 10 times of watery diarrhea each day.  Her abdominal pain is diffuse, constant, cramping, nonradiating, not alleviated or aggravated by any known factors.  She states that her abdomen has been gradually distended recently. No fever or chills.  Patient has mild dry cough and mild shortness or breath.  She also reports chest discomfort earlier, which has really resolved.  Currently no active chest pain.  Denies symptoms of UTI.     Data reviewed independently and ED Course: pt was found to have WBC 6.0, magnesium  1.1, potassium 2.8, AKI with creatinine 1.56, BUN <5.0, and GFR 44 (recent creatinine 1.17 on 03/06/2023, and 0.64 on 06/24/2022), troponin level 17, lactic acid 6.2, INR 1.3, negative PCR for COVID, flu and RSV, abnormal liver function (ALP 386, AST 119, ALT 38, total bilirubin 6.3, direct bilirubin 0.5), lipase 33.  Chest x-ray negative.  Patient is admitted to telemetry bed as inpatient.  Dr. Desiderio of  general surgery is consulted.   CT abdomen/pelvis: 1. Extensive inflammatory process involving small and large bowel. This could represent an infectious or inflammatory process versus ischemia. 2. Large amount of ascites. 3. Fatty liver. 4. Pancreatic lesion, smaller than on the prior study, presumably a pseudocyst. 5.  Hepatobiliary: Marked hepatic fatty infiltration. No focal lesions identified. No biliary ductal dilatation.    Assessment and Plan:  # Acute gastroenteritis vs chronic diarrhea Patient presented with N/V/D and abdominal pain with distention CT scan shows inflammation of small and large intestine infectious versus inflammatory WBC and procalcitonin elevated Negative C. difficile and GI pathogen S/p IV fluid given in the ED Discontinued IV fluid due to significant ascites, vital signs stable. S/p vancomycin  given in the ED, discontinued aztreonam  continue ceftriaxone  and Flagyl    # Ascites, possible SBP Chronic liver disease may have progressed to liver cirrhosis Elevated WBC count Procalcitonin 2.0 elevated Continue ceftriaxone , Flagyl  S/p paracentesis 5 L fluid was tapped by IR on 12/31 S/p albumin  25 g IV one-time dose on 12/31 Fluid culture No growth, blood culture NGTD GI following On 1/2 started Lasix  40 mg IV twice daily  # Acute hypoxic respiratory failure most likely due to pulmonary edema and COPD exacerbation On 1/2 started Solu-Medrol  40 mg IV twice daily for 2 days followed by 40 mg IV daily for 3 days Started DuoNeb every 6 hourly scheduled, Breo Ellipta  inhaler Mucinex  extremity gram p.o. twice daily, Tussionex as needed for cough Lasix  40 mg IV twice daily Continue supplemental O2 inhalation and gradually wean off PCCM consulted, patient was transferred to  stepdown unit for close monitoring   # Alcoholic hepatitis # Hyperbilirubinemia Mdf 22 CT shows inflammation with fatty infiltration of liver No need of prednisone  as per GI Monitor  LFTs   # 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 Started lactulose , titrate to 2-3 BM per day Trend ammonia level   # AKI On 11/01/22 Cr 0.83, gradually getting worse Cr 1.75 on admission 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 Avoid nephrotoxic medication, use renally dose medications Monitor renal function and urine output Daily Cr 1.25   # Lactic acidosis most likely due to intravascular volume depletion Lactic acid 5.1, improved repeat lactic acid 1.8 s/p IVF  # EtOH use, abstinence counseling done  Last drink was 3 days before admission Continue CIWA protocol and watch for withdrawal symptoms Continue multivitamin, folate and thiamine  Started Librium  tapering  # History of polysubstance use, UDS positive for THC and opiates Drug abuse abstinence counseling done  # GERD: Continue PPI and Carafate  # Depression, continued Celexa   # Isotonic hyponatremia, most likely nutritional deficiency. Resolved  serum osmolality 285 wnl # Hypokalemia, potassium repleted. # Hypophosphatemia, Phos repleted. # Hypomagnesemia, mag repleted. Monitor electrolytes and replete as needed.  # Macrocytic anemia and thrombocytopenia due to EtOH use Monitor H&H   Body mass index is 35.15 kg/m.  Interventions:  Diet: Regular diet DVT Prophylaxis: Subcutaneous Heparin     Advance goals of care discussion: Full code  Family Communication: family was not present at bedside, at the time of interview.  The pt provided permission to discuss medical plan with the family. Opportunity was given to ask question and all questions were answered satisfactorily.   Disposition:  Pt is from Home, admitted with ascites, abdominal distention, still has electrolyte imbalance, which precludes a safe discharge. Discharge to home, when stable, may need few days to improve.  Subjective: Early morning patient was feeling sick, short of breath, she was very drowsy unable to offer any specific complaints.   Physical  Exam: General: Mild respiratory distress, drowsy with mumbling speech Eyes: PERRLA ENT: Oral Mucosa Clear, moist  Neck: no JVD,  Cardiovascular: S1 and S2 Present, no Murmur,  Respiratory: Equal air entry bilaterally, bilateral lateral crackles, mild wheezing bilaterally Abdomen: Distended due to ascites, mild generalized tenderness, BS + Skin: no rashes Extremities: 1-2+ pedal edema, no calf tenderness Neurologic: without any new focal findings Gait not checked due to patient safety concerns  Vitals:   04/12/23 1330 04/12/23 1332 04/12/23 1400 04/12/23 1500  BP: (!) 135/92  135/86 127/86  Pulse:   (!) 104 (!) 111  Resp:   (!) 26 (!) 26  Temp:      TempSrc:      SpO2:  93% 95% 97%  Weight:      Height:        Intake/Output Summary (Last 24 hours) at 04/12/2023 1557 Last data filed at 04/12/2023 1400 Gross per 24 hour  Intake 400.44 ml  Output 3 ml  Net 397.44 ml    Filed Weights   04/09/23 1110 04/09/23 1113 04/12/23 1112  Weight: 88.5 kg 88 kg 90 kg    Data Reviewed: I have personally reviewed and interpreted daily labs, tele strips, imagings as discussed above. I reviewed all nursing notes, pharmacy notes, vitals, pertinent old records I have discussed plan of care as described above with RN and patient/family.  CBC: Recent Labs  Lab 04/09/23 1746 04/10/23 0455 04/11/23 0402 04/12/23 0758  WBC 16.0* 14.3* 15.2* 8.9  NEUTROABS 13.3*  --   --   --  HGB 10.7* 9.7* 9.6* 10.2*  HCT 31.5* 28.1* 28.3* 30.8*  MCV 101.0* 103.3* 103.7* 102.7*  PLT 172 136* 152 150   Basic Metabolic Panel: Recent Labs  Lab 04/09/23 1746 04/10/23 0455 04/10/23 1433 04/11/23 0402 04/11/23 1700 04/12/23 0758 04/12/23 0804  NA  --  132* 135 135 134*  --  135  K  --  3.0* 3.0* 3.2* 3.8  --  3.8  CL  --  95* 96* 98 98  --  97*  CO2  --  24 24 23 23   --  24  GLUCOSE  --  104* 128* 126* 137*  --  125*  BUN  --  5* 6 6 6   --  7  CREATININE  --  1.75* 1.61* 1.47* 1.49*  --  1.25*   CALCIUM  --  7.6* 7.6* 7.7* 7.6*  --  8.1*  MG 1.1* 1.9 1.9 1.6* 2.1 2.2  --   PHOS 3.0  --  1.2* <1.0* 1.7* 2.0*  --     Studies: DG Chest Port 1 View Result Date: 04/12/2023 CLINICAL DATA:  Respiratory distress EXAM: PORTABLE CHEST 1 VIEW COMPARISON:  04/09/2023 FINDINGS: The heart size and mediastinal contours are within normal limits. Both lungs are clear. The visualized skeletal structures are unremarkable. IMPRESSION: No acute abnormality of the lungs in AP portable projection. Electronically Signed   By: Marolyn Katie Jaksch M.D.   On: 04/12/2023 08:33   US  EKG SITE RITE Result Date: 04/12/2023 If Site Rite image not attached, placement could not be confirmed due to current cardiac rhythm.    Scheduled Meds:  bacitracin -polymyxin b   1 Application Both Eyes TID   carvedilol   6.25 mg Oral BID WC   chlordiazePOXIDE   25 mg Oral TID   Chlorhexidine  Gluconate Cloth  6 each Topical Daily   citalopram   20 mg Oral Daily   feeding supplement  237 mL Oral TID BM   ferrous sulfate   325 mg Oral Daily   fluticasone  furoate-vilanterol  1 puff Inhalation Daily   folic acid   1 mg Oral Daily   furosemide   40 mg Intravenous BID   guaiFENesin   600 mg Oral BID   heparin   5,000 Units Subcutaneous Q8H   lactulose   30 g Oral TID   LORazepam   0-4 mg Intravenous Q12H   methocarbamol   500 mg Oral BID   methylPREDNISolone  (SOLU-MEDROL ) injection  40 mg Intravenous Q12H   Followed by   NOREEN ON 04/14/2023] methylPREDNISolone  (SOLU-MEDROL ) injection  40 mg Intravenous Daily   multivitamin with minerals  1 tablet Oral Daily   nicotine   21 mg Transdermal Daily   pantoprazole   40 mg Oral BID AC   sucralfate   1 g Oral TID WC & HS   thiamine   100 mg Oral Daily   Or   thiamine   100 mg Intravenous Daily   Continuous Infusions:  cefTRIAXone  (ROCEPHIN )  IV 2 g (04/12/23 1446)   metronidazole  500 mg (04/12/23 0840)   potassium PHOSPHATE  30 mmol in dextrose  5 % 250 mL infusion 30 mmol (04/12/23 1155)   PRN Meds:  acetaminophen , ALPRAZolam , hydrALAZINE , ipratropium-albuterol , LORazepam  **OR** LORazepam , morphine  injection, ondansetron  (ZOFRAN ) IV, oxyCODONE   Time spent: 55 minutes  Author: ELVAN SOR. MD [facility] 04/12/2023 3:57 PM  To reach On-call, see care teams to locate the attending and reach out to them via [email or web address]. If 7PM-7AM, please contact night-coverage If you still have difficulty reaching the attending provider, please page the [facility] (Director on Call) for [facility] on amion for assistance.

## 2023-04-12 NOTE — Plan of Care (Signed)
  Problem: Education: Goal: Knowledge of General Education information will improve Description: Including pain rating scale, medication(s)/side effects and non-pharmacologic comfort measures Outcome: Progressing   Problem: Clinical Measurements: Goal: Respiratory complications will improve Outcome: Progressing Goal: Cardiovascular complication will be avoided Outcome: Progressing   Problem: Elimination: Goal: Will not experience complications related to urinary retention Outcome: Progressing   Problem: Pain Management: Goal: General experience of comfort will improve Outcome: Progressing   Problem: Safety: Goal: Ability to remain free from injury will improve Outcome: Progressing

## 2023-04-13 DIAGNOSIS — R197 Diarrhea, unspecified: Secondary | ICD-10-CM | POA: Diagnosis not present

## 2023-04-13 DIAGNOSIS — R112 Nausea with vomiting, unspecified: Secondary | ICD-10-CM | POA: Diagnosis not present

## 2023-04-13 DIAGNOSIS — Z7189 Other specified counseling: Secondary | ICD-10-CM | POA: Diagnosis not present

## 2023-04-13 LAB — BASIC METABOLIC PANEL
Anion gap: 13 (ref 5–15)
BUN: 6 mg/dL (ref 6–20)
CO2: 28 mmol/L (ref 22–32)
Calcium: 8 mg/dL — ABNORMAL LOW (ref 8.9–10.3)
Chloride: 97 mmol/L — ABNORMAL LOW (ref 98–111)
Creatinine, Ser: 1.36 mg/dL — ABNORMAL HIGH (ref 0.44–1.00)
GFR, Estimated: 51 mL/min — ABNORMAL LOW (ref >60)
Glucose, Bld: 140 mg/dL — ABNORMAL HIGH (ref 70–99)
Potassium: 3.9 mmol/L (ref 3.5–5.1)
Sodium: 138 mmol/L (ref 135–145)

## 2023-04-13 LAB — AMMONIA: Ammonia: 80 umol/L — ABNORMAL HIGH (ref 9–35)

## 2023-04-13 LAB — MAGNESIUM: Magnesium: 1.8 mg/dL (ref 1.7–2.4)

## 2023-04-13 LAB — HEPATIC FUNCTION PANEL
ALT: 38 U/L (ref 0–44)
AST: 75 U/L — ABNORMAL HIGH (ref 15–41)
Albumin: 2.8 g/dL — ABNORMAL LOW (ref 3.5–5.0)
Alkaline Phosphatase: 278 U/L — ABNORMAL HIGH (ref 38–126)
Bilirubin, Direct: 1.3 mg/dL — ABNORMAL HIGH (ref 0.0–0.2)
Indirect Bilirubin: 1.2 mg/dL — ABNORMAL HIGH (ref 0.3–0.9)
Total Bilirubin: 2.5 mg/dL — ABNORMAL HIGH (ref 0.0–1.2)
Total Protein: 6.7 g/dL (ref 6.5–8.1)

## 2023-04-13 LAB — CBC
HCT: 29.3 % — ABNORMAL LOW (ref 36.0–46.0)
Hemoglobin: 9.4 g/dL — ABNORMAL LOW (ref 12.0–15.0)
MCH: 34.3 pg — ABNORMAL HIGH (ref 26.0–34.0)
MCHC: 32.1 g/dL (ref 30.0–36.0)
MCV: 106.9 fL — ABNORMAL HIGH (ref 80.0–100.0)
Platelets: 158 10*3/uL (ref 150–400)
RBC: 2.74 MIL/uL — ABNORMAL LOW (ref 3.87–5.11)
RDW: 17.1 % — ABNORMAL HIGH (ref 11.5–15.5)
WBC: 11.7 10*3/uL — ABNORMAL HIGH (ref 4.0–10.5)
nRBC: 0.2 % (ref 0.0–0.2)

## 2023-04-13 LAB — BODY FLUID CULTURE W GRAM STAIN: Culture: NO GROWTH

## 2023-04-13 LAB — PHOSPHORUS: Phosphorus: 4.3 mg/dL (ref 2.5–4.6)

## 2023-04-13 LAB — GLUCOSE, CAPILLARY: Glucose-Capillary: 137 mg/dL — ABNORMAL HIGH (ref 70–99)

## 2023-04-13 MED ORDER — CHLORHEXIDINE GLUCONATE CLOTH 2 % EX PADS
6.0000 | MEDICATED_PAD | Freq: Every day | CUTANEOUS | Status: DC
  Administered 2023-04-13 – 2023-04-18 (×5): 6 via TOPICAL

## 2023-04-13 MED ORDER — ZINC OXIDE 40 % EX OINT
TOPICAL_OINTMENT | Freq: Three times a day (TID) | CUTANEOUS | Status: DC | PRN
  Filled 2023-04-13: qty 113

## 2023-04-13 MED ORDER — MAGNESIUM SULFATE 2 GM/50ML IV SOLN
2.0000 g | Freq: Once | INTRAVENOUS | Status: AC
  Administered 2023-04-13: 2 g via INTRAVENOUS
  Filled 2023-04-13: qty 50

## 2023-04-13 MED ORDER — ORAL CARE MOUTH RINSE: 15.0000 mL | OROMUCOSAL | Status: DC | PRN

## 2023-04-13 NOTE — Plan of Care (Signed)
  Problem: Clinical Measurements: Goal: Ability to maintain clinical measurements within normal limits will improve Outcome: Progressing Goal: Cardiovascular complication will be avoided Outcome: Progressing   Problem: Elimination: Goal: Will not experience complications related to urinary retention Outcome: Progressing   Problem: Pain Management: Goal: General experience of comfort will improve Outcome: Progressing   Problem: Safety: Goal: Ability to remain free from injury will improve Outcome: Progressing   Problem: Education: Goal: Knowledge of General Education information will improve Description: Including pain rating scale, medication(s)/side effects and non-pharmacologic comfort measures Outcome: Not Progressing   Problem: Health Behavior/Discharge Planning: Goal: Ability to manage health-related needs will improve Outcome: Not Progressing   Problem: Nutrition: Goal: Adequate nutrition will be maintained Outcome: Not Progressing

## 2023-04-13 NOTE — Progress Notes (Signed)
 Daily Progress Note   Patient Name: Katie Stanley       Date: 04/13/2023 DOB: 1986/02/04  Age: 38 y.o. MRN#: [id number] Attending Physician: Von Bellis, MD Primary Care Physician: Vicci Duwaine SQUIBB, DO Admit Date: 04/09/2023  Reason for Consultation/Follow-up: Establishing goals of care  Subjective: Notes and labs reviewed.  In to see patient.  She is currently resting in bed.  She appears sleepy, but is oriented and able to answer my questions.  She has no questions at this time, is hopeful for improvement.  Length of Stay: 4  Current Medications: Scheduled Meds:   carvedilol   6.25 mg Oral BID WC   chlordiazePOXIDE   15 mg Oral TID   Followed by   Katie Stanley ON 04/14/2023] chlordiazePOXIDE   10 mg Oral TID   Chlorhexidine  Gluconate Cloth  6 each Topical Daily   citalopram   20 mg Oral Daily   feeding supplement  237 mL Oral TID BM   ferrous sulfate   325 mg Oral Daily   fluticasone  furoate-vilanterol  1 puff Inhalation Daily   folic acid   1 mg Oral Daily   furosemide   40 mg Intravenous BID   guaiFENesin   600 mg Oral BID   heparin   5,000 Units Subcutaneous Q8H   lactulose   30 g Oral TID   LORazepam   0-4 mg Intravenous Q12H   methocarbamol   500 mg Oral BID   methylPREDNISolone  (SOLU-MEDROL ) injection  40 mg Intravenous Q12H   Followed by   Katie Stanley ON 04/14/2023] methylPREDNISolone  (SOLU-MEDROL ) injection  40 mg Intravenous Daily   multivitamin with minerals  1 tablet Oral Daily   nicotine   21 mg Transdermal Daily   pantoprazole   40 mg Oral BID AC   sodium chloride  flush  10-40 mL Intracatheter Q12H   sucralfate   1 g Oral TID WC & HS   thiamine   100 mg Oral Daily   Or   thiamine   100 mg Intravenous Daily    Continuous Infusions:  cefTRIAXone  (ROCEPHIN )  IV Stopped (04/13/23 1504)    metronidazole  Stopped (04/13/23 0926)    PRN Meds: acetaminophen , ALPRAZolam , chlorpheniramine-HYDROcodone , hydrALAZINE , ipratropium-albuterol , morphine  injection, ondansetron  (ZOFRAN ) IV, mouth rinse, oxyCODONE , sodium chloride  flush  Physical Exam Pulmonary:     Effort: Pulmonary effort is normal.  Neurological:     Mental Status: She is alert.  Vital Signs: BP 133/81   Pulse (!) 105   Temp 98 F (36.7 C)   Resp (!) 21   Ht 5' 3 (1.6 m)   Wt 90 kg   SpO2 95%   BMI 35.15 kg/m  SpO2: SpO2: 95 % O2 Device: O2 Device: High Flow Nasal Cannula O2 Flow Rate: O2 Flow Rate (L/min): 8 L/min  Intake/output summary:  Intake/Output Summary (Last 24 hours) at 04/13/2023 1639 Last data filed at 04/13/2023 1600 Gross per 24 hour  Intake 1240.74 ml  Output 1050 ml  Net 190.74 ml   LBM: Last BM Date : 03/12/24 Baseline Weight: Weight: 88.5 kg Most recent weight: Weight: 90 kg       Patient Active Problem List   Diagnosis Date Noted   Nausea vomiting and diarrhea 04/09/2023   Ascitic fluid 04/09/2023   Abnormal LFTs 04/09/2023   Alcoholic hepatitis without ascites 02/11/2023   Alcohol use disorder 02/11/2023   AKI (acute kidney injury) (HCC) 02/09/2023   Disorder of electrolytes 02/09/2023   Polysubstance abuse (HCC) 02/09/2023   Tobacco abuse 02/09/2023   Depression with anxiety 02/09/2023   Obesity (BMI 30-39.9) 02/09/2023   Hypomagnesemia 02/09/2023   Normocytic anemia 02/09/2023   GERD (gastroesophageal reflux disease) 02/09/2023   Hyponatremia 02/09/2023   HTN (hypertension) 02/09/2023   Abnormal liver function 02/09/2023   Hypophosphatemia 02/09/2023   Diabetes mellitus type 2 with complications (HCC) 02/05/2023   Hypokalemia 10/27/2022   Abdominal pain 10/27/2022   Polyp of transverse colon 09/20/2022   Hepatic steatosis 04/28/2022   Vitamin D  deficiency 04/28/2022   History of cardiac arrest 04/28/2022   Primary hypertension 05/16/2021   Alcohol  abuse 12/31/2020   Paroxysmal tachycardia (HCC) 07/22/2019   Morbid obesity (HCC) 04/29/2018   Gastroesophageal reflux disease 12/25/2017   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HCC) 05/01/2016   Anxiety 02/25/2015   Depression, recurrent (HCC) 02/25/2015   Chronic hepatitis C virus infection (HCC) 07/07/2009    Palliative Care Assessment & Plan    Recommendations/Plan: Continue full code/full scope PMT will follow-up on return to service.  Code Status:    Code Status Orders  (From admission, onward)           Start     Ordered   04/09/23 2116  Full code  Continuous       Question:  By:  Answer:  Consent: discussion documented in EHR   04/09/23 2116           Code Status History     Date Active Date Inactive Code Status Order ID Comments User Context   02/09/2023 2234 02/12/2023 1740 Full Code [id number]  Hilma Rankins, MD ED   10/27/2022 1525 11/01/2022 1951 Full Code [id number]  Eldonna Elspeth PARAS, MD ED   06/21/2022 1718 06/24/2022 1804 Full Code [id number]  Leotis Bogus, MD ED   01/20/2020 1457 01/20/2020 2322 Full Code [id number]  Leonce Garnette BIRCH, MD Inpatient     Thank you for allowing the Palliative Medicine Team to assist in the care of this patient.    Camelia Lewis, NP  Please contact Palliative Medicine Team phone at [phone] for questions and concerns.

## 2023-04-13 NOTE — Plan of Care (Signed)
  Problem: Clinical Measurements: Goal: Will remain free from infection Outcome: Progressing Goal: Diagnostic test results will improve Outcome: Progressing Goal: Respiratory complications will improve Outcome: Progressing Goal: Cardiovascular complication will be avoided Outcome: Progressing   Problem: Coping: Goal: Level of anxiety will decrease Outcome: Progressing   Problem: Skin Integrity: Goal: Risk for impaired skin integrity will decrease Outcome: Progressing

## 2023-04-13 NOTE — Progress Notes (Signed)
 [facility] Progress Note  Patient: Katie Stanley    FMW:[id number]  DOA: 04/09/2023     Date of Service: the patient was seen and examined on 04/13/2023  Chief Complaint  Patient presents with   Chest Pain   Brief hospital course: JEARLEAN DEMAURO is a 38 y.o. female with PMH of diet-controlled diabetes, chronic liver disease-MAFLD/MASH, self cleared HCV, HAV,   polysubstance abuse, tobacco abuse, alcohol abuse, GERD, depression with anxiety, anemia, obesity, who presents with nausea, vomiting, diarrhea, abdominal pain, abdominal distention.   Patient was recently hospitalized from 11/1 - 11/4 due to multiple electrolytes disturbance and diarrhea.  Patient had positive C. difficile antigen, but negative toxigenic C. difficile PCR.  Vancomycin  was not continued at discharge after discussing with GI.   Patient states that she has nausea, vomiting, diarrhea and abdominal pain for more than 1 week this time.  She has more than 10 times of nonbilious nonbloody vomiting and more than 10 times of watery diarrhea each day.  Her abdominal pain is diffuse, constant, cramping, nonradiating, not alleviated or aggravated by any known factors.  She states that her abdomen has been gradually distended recently. No fever or chills.  Patient has mild dry cough and mild shortness or breath.  She also reports chest discomfort earlier, which has really resolved.  Currently no active chest pain.  Denies symptoms of UTI.     Data reviewed independently and ED Course: pt was found to have WBC 6.0, magnesium  1.1, potassium 2.8, AKI with creatinine 1.56, BUN <5.0, and GFR 44 (recent creatinine 1.17 on 03/06/2023, and 0.64 on 06/24/2022), troponin level 17, lactic acid 6.2, INR 1.3, negative PCR for COVID, flu and RSV, abnormal liver function (ALP 386, AST 119, ALT 38, total bilirubin 6.3, direct bilirubin 0.5), lipase 33.  Chest x-ray negative.  Patient is admitted to telemetry bed as inpatient.  Dr. Desiderio of  general surgery is consulted.   CT abdomen/pelvis: 1. Extensive inflammatory process involving small and large bowel. This could represent an infectious or inflammatory process versus ischemia. 2. Large amount of ascites. 3. Fatty liver. 4. Pancreatic lesion, smaller than on the prior study, presumably a pseudocyst. 5.  Hepatobiliary: Marked hepatic fatty infiltration. No focal lesions identified. No biliary ductal dilatation.    Assessment and Plan:  # Acute gastroenteritis vs chronic diarrhea Patient presented with N/V/D and abdominal pain with distention CT scan shows inflammation of small and large intestine infectious versus inflammatory WBC and procalcitonin elevated Negative C. difficile and GI pathogen S/p IV fluid given in the ED Discontinued IV fluid due to significant ascites, vital signs stable. S/p vancomycin  given in the ED, discontinued aztreonam  Continue Flagyl  for 5 days, and ceftriaxone  for 7 days for possible SBP   # Ascites, possible SBP Chronic liver disease may have progressed to liver cirrhosis Elevated WBC count Procalcitonin 2.0 elevated Continue Flagyl  for 5 days, and ceftriaxone  for 7 days for possible SBP S/p paracentesis 5 L fluid was tapped by IR on 12/31 S/p albumin  25 g IV one-time dose on 12/31 Fluid culture No growth, blood culture [facility] GI following On 1/2 started Lasix  40 mg IV twice daily Continue repeating abdominal ultrasound and paracentesis on Monday   # Acute hypoxic respiratory failure most likely due to pulmonary edema and COPD exacerbation On 1/2 started Solu-Medrol  40 mg IV twice daily for 2 days followed by 40 mg IV daily for 3 days Started DuoNeb every 6 hourly scheduled, Breo Ellipta  inhaler Mucinex  extremity gram  p.o. twice daily, Tussionex as needed for cough Lasix  40 mg IV twice daily Continue supplemental O2 inhalation and gradually wean off PCCM consulted, patient was transferred to stepdown unit for close monitoring on  1/2 1/3 still patient has respiratory distress requiring 8L via HFNC   # Alcoholic hepatitis # Hyperbilirubinemia Mdf 22 CT shows inflammation with fatty infiltration of liver No need of prednisone  as per GI Monitor LFTs   # 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 Started lactulose , titrate to 2-3 BM per day NH4 level 103--80 Trend ammonia level   # AKI On 11/01/22 Cr 0.83, gradually getting worse Cr 1.75 on admission 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 Avoid nephrotoxic medication, use renally dose medications Monitor renal function and urine output Daily Cr 1.25---1.36  # Lactic acidosis most likely due to intravascular volume depletion Lactic acid 5.1, improved repeat lactic acid 1.8 s/p IVF  # EtOH use, abstinence counseling done  Last drink was 3 days before admission Continue CIWA protocol and watch for withdrawal symptoms Continue multivitamin, folate and thiamine  Started Librium  tapering  # History of polysubstance use, UDS positive for THC and opiates Drug abuse abstinence counseling done  # GERD: Continue PPI and Carafate  # Depression, continued Celexa   # Isotonic hyponatremia, most likely nutritional deficiency. Resolved  serum osmolality 285 wnl # Hypokalemia, potassium repleted. # Hypophosphatemia, Phos repleted. # Hypomagnesemia, mag repleted. Monitor electrolytes and replete as needed.  # Macrocytic anemia and thrombocytopenia due to EtOH use Monitor H&H   Body mass index is 35.15 kg/m.  Interventions:  Diet: Regular diet DVT Prophylaxis: Subcutaneous Heparin     Advance goals of care discussion: Full code  Family Communication: family was not present at bedside, at the time of interview.  The pt provided permission to discuss medical plan with the family. Opportunity was given to ask question and all questions were answered satisfactorily.   Disposition:  Pt is from Home, admitted with ascites, abdominal distention, still has Resp failure and  electrolyte imbalance, which precludes a safe discharge. Discharge to home, when stable, may need few days to improve.  Subjective: No significant events overnight, patient feels little bit improvement, still has significant shortness of breath, abdominal pain is getting better, mild pain is compared to yesterday.  Still feels sleepy but more awake able as compared to yesterday.  No chest pain or palpitation, no nasal complaints.   Physical Exam: General: Mild respiratory distress, drowsy but arousable, much better than yesterday Eyes: PERRLA ENT: Oral Mucosa Clear, moist  Neck: no JVD,  Cardiovascular: S1 and S2 Present, no Murmur,  Respiratory: Equal air entry bilaterally, bilateral lateral crackles, mild wheezing bilaterally Abdomen: Distended due to ascites, mild generalized tenderness, BS + Skin: no rashes Extremities: mild pedal edema, no calf tenderness Neurologic: without any new focal findings Gait not checked due to patient safety concerns  Vitals:   04/13/23 1000 04/13/23 1100 04/13/23 1200 04/13/23 1231  BP: 131/88 121/75 (!) 146/103 (!) 146/97  Pulse: (!) 101 (!) 104 (!) 110 (!) 109  Resp: (!) 23 (!) 21 19   Temp:   98 F (36.7 C)   TempSrc:      SpO2: 92% 92% 92% 91%  Weight:      Height:        Intake/Output Summary (Last 24 hours) at 04/13/2023 1416 Last data filed at 04/13/2023 1145 Gross per 24 hour  Intake 306.37 ml  Output 1050 ml  Net -743.63 ml    Filed Weights   04/09/23 1110 04/09/23 1113  04/12/23 1112  Weight: 88.5 kg 88 kg 90 kg    Data Reviewed: I have personally reviewed and interpreted daily labs, tele strips, imagings as discussed above. I reviewed all nursing notes, pharmacy notes, vitals, pertinent old records I have discussed plan of care as described above with RN and patient/family.  CBC: Recent Labs  Lab 04/09/23 1746 04/10/23 0455 04/11/23 0402 04/12/23 0758 04/13/23 0540  WBC 16.0* 14.3* 15.2* 8.9 11.7*  NEUTROABS 13.3*  --    --   --   --   HGB 10.7* 9.7* 9.6* 10.2* 9.4*  HCT 31.5* 28.1* 28.3* 30.8* 29.3*  MCV 101.0* 103.3* 103.7* 102.7* 106.9*  PLT 172 136* 152 150 158   Basic Metabolic Panel: Recent Labs  Lab 04/10/23 1433 04/11/23 0402 04/11/23 1700 04/12/23 0758 04/12/23 0804 04/13/23 0540  NA 135 135 134*  --  135 138  K 3.0* 3.2* 3.8  --  3.8 3.9  CL 96* 98 98  --  97* 97*  CO2 24 23 23   --  24 28  GLUCOSE 128* 126* 137*  --  125* 140*  BUN 6 6 6   --  7 6  CREATININE 1.61* 1.47* 1.49*  --  1.25* 1.36*  CALCIUM 7.6* 7.7* 7.6*  --  8.1* 8.0*  MG 1.9 1.6* 2.1 2.2  --  1.8  PHOS 1.2* <1.0* 1.7* 2.0*  --  4.3    Studies: No results found.    Scheduled Meds:  bacitracin -polymyxin b   1 Application Both Eyes TID   carvedilol   6.25 mg Oral BID WC   chlordiazePOXIDE   15 mg Oral TID   Followed by   NOREEN ON 04/14/2023] chlordiazePOXIDE   10 mg Oral TID   Chlorhexidine  Gluconate Cloth  6 each Topical Daily   citalopram   20 mg Oral Daily   feeding supplement  237 mL Oral TID BM   ferrous sulfate   325 mg Oral Daily   fluticasone  furoate-vilanterol  1 puff Inhalation Daily   folic acid   1 mg Oral Daily   furosemide   40 mg Intravenous BID   guaiFENesin   600 mg Oral BID   heparin   5,000 Units Subcutaneous Q8H   lactulose   30 g Oral TID   LORazepam   0-4 mg Intravenous Q12H   methocarbamol   500 mg Oral BID   methylPREDNISolone  (SOLU-MEDROL ) injection  40 mg Intravenous Q12H   Followed by   NOREEN ON 04/14/2023] methylPREDNISolone  (SOLU-MEDROL ) injection  40 mg Intravenous Daily   multivitamin with minerals  1 tablet Oral Daily   nicotine   21 mg Transdermal Daily   pantoprazole   40 mg Oral BID AC   sodium chloride  flush  10-40 mL Intracatheter Q12H   sucralfate   1 g Oral TID WC & HS   thiamine   100 mg Oral Daily   Or   thiamine   100 mg Intravenous Daily   Continuous Infusions:  cefTRIAXone  (ROCEPHIN )  IV Stopped (04/12/23 1516)   metronidazole  500 mg (04/13/23 0802)   PRN Meds:  acetaminophen , ALPRAZolam , chlorpheniramine-HYDROcodone , hydrALAZINE , ipratropium-albuterol , morphine  injection, ondansetron  (ZOFRAN ) IV, mouth rinse, oxyCODONE , sodium chloride  flush  Time spent: 55 minutes  Author: ELVAN SOR. MD [facility] 04/13/2023 2:16 PM  To reach On-call, see care teams to locate the attending and reach out to them via [email or web address]. If 7PM-7AM, please contact night-coverage If you still have difficulty reaching the attending provider, please page the [facility] (Director on Call) for [facility] on amion for assistance.

## 2023-04-13 NOTE — Progress Notes (Signed)
 NAME:  Katie Stanley, MRN:  [id number], DOB:  1985/11/14, LOS: 4 ADMISSION DATE:  04/09/2023, CONSULTATION DATE:  04/12/2023 REFERRING MD:  Dr. Von, CHIEF COMPLAINT:  ETOH withdrawal, Acute Respiratory Distress   Brief Pt Description / Synopsis:  38 y.o. female with past medical history significant for ETOH and polysubstance abuse admitted with Acute Gastroenteritis, Alcoholic Hepatitis, Chronic Liver disease with concern for progression to Cirrhosis, ascites, and hyperammonemia.  Course complicated by developing Alcohol Withdrawal and Acute Hypoxic Respiratory Failure.  History of Present Illness:  Katie Stanley is a 38 y.o. female with PMH of diet-controlled diabetes, chronic liver disease-MAFLD/MASH, self cleared HCV, HAV,   polysubstance abuse, tobacco abuse, alcohol abuse, GERD, depression with anxiety, anemia, obesity, who presented to [facility] ED on 04/09/23 with nausea, vomiting, diarrhea, abdominal pain, abdominal distention.   Pt is currently altered and unable to contribute to history and no family is currently available, therefore history is obtained from chart review.  04/15/23- patient is slightly improved today. She met with palliative team today . Hepatic function panel Is improved. Wbc count slight trend up.    Pertinent  Medical History   Past Medical History:  Diagnosis Date   Alcohol abuse    Anxiety    Biliary calculi 12/07/2009   Cholelithiasis    Depression    Diet-controlled diabetes mellitus (HCC)    GERD (gastroesophageal reflux disease)    Hepatitis C    body self healed per patient   LVH (left ventricular hypertrophy)    a. 10/2022 Echo: EF 60-65%, no rwma, sev conc LVH, nl RV size/fxn, triv MR. Mobile density near ant MV leaflet- felt to be redundant chordae tendinae..   Morbid obesity (HCC)    Palpitations    a. 07/2019 Holter: Sinus rhythm, PVCs, rare PACs.  No significant arrhythmias.   Pancreatic pseudocyst    a. 02/2023 MRI abd: improving unilocular  pancreatic pseudocyst.   Retained intrauterine contraceptive device (IUD) 01/20/2020   Substance abuse (HCC)     Micro Data:  12/30: COVID-19/RSV/FLU PCR>>negative 12/30: C. Diff/GI panel PCR>>negative 12/30: Blood culture x2>> no growth to date 12/31: Peritoneal fluid from paracentesis>> no growth to date  Antimicrobials:   Anti-infectives (From admission, onward)    Start     Dose/Rate Route Frequency Ordered Stop   04/10/23 1400  cefTRIAXone  (ROCEPHIN ) 2 g in sodium chloride  0.9 % 100 mL IVPB        2 g 200 mL/hr over 30 Minutes Intravenous Every 24 hours 04/10/23 0845     04/10/23 0900  metroNIDAZOLE  (FLAGYL ) IVPB 500 mg        500 mg 100 mL/hr over 60 Minutes Intravenous Every 12 hours 04/09/23 2113     04/10/23 0600  aztreonam  (AZACTAM ) 2 g in sodium chloride  0.9 % 100 mL IVPB  Status:  Discontinued        2 g 200 mL/hr over 30 Minutes Intravenous Every 8 hours 04/09/23 2132 04/10/23 0845   04/09/23 2030  aztreonam  (AZACTAM ) 2 g in sodium chloride  0.9 % 100 mL IVPB        2 g 200 mL/hr over 30 Minutes Intravenous  Once 04/09/23 2016 04/09/23 2151   04/09/23 2030  metroNIDAZOLE  (FLAGYL ) IVPB 500 mg        500 mg 100 mL/hr over 60 Minutes Intravenous  Once 04/09/23 2016 04/09/23 2352   04/09/23 2030  vancomycin  (VANCOCIN ) IVPB 1000 mg/200 mL premix        1,000 mg 200  mL/hr over 60 Minutes Intravenous  Once 04/09/23 2016 04/10/23 0118       Significant Hospital Events: Including procedures, antibiotic start and stop dates in addition to other pertinent events   12/30: Presented to ED with N/V/D/Abdominal pain.  Admitted by Hospitalists 12/31: Underwent Paracentesis with 5 L fluid removed.  GI & Palliative Care consulted. 1/1: Late in the evening with hallucinations and anxiety concerning for developing ETOH withdrawal, also with increased WOB 1/2: Continues with increased WOB, PCCM consulted.  Transfer to [facility] given high risk for further decompensation.  Started on  Lasix  and BD due to concern for Pulmonary Edema and AECOPD. e  Objective   Blood pressure 137/88, pulse (!) 111, temperature 98.8 F (37.1 C), temperature source Oral, resp. rate (!) 24, height 5' 3 (1.6 m), weight 90 kg, SpO2 90%.        Intake/Output Summary (Last 24 hours) at 04/13/2023 9147 Last data filed at 04/13/2023 0800 Gross per 24 hour  Intake 306.37 ml  Output 303 ml  Net 3.37 ml   Filed Weights   04/09/23 1110 04/09/23 1113 04/12/23 1112  Weight: 88.5 kg 88 kg 90 kg    Examination: General: Acute on chronically ill appearing obese female, sitting in bed, with mild respiratory distress HENT: Atraumatic, normocephalic, neck supple, no JVD Lungs: Coarse breath sounds throughout, even, mild tachypnea and abdominal muscle use Cardiovascular:  Tachycardia, regular rhythm, s1s2, no M/G/R Abdomen: Obese, distended Extremities: Normal bulk and tone, no deformities, no edema, no cyanosis Neuro: Lethargic, arouses easily to voice, oriented to person and place, moves all extremities to commands, no focal deficits noted, pupils PERRL GU: Deferred    Assessment & Plan:   #Acute Hypoxic Respiratory Failure, suspect due to AECOPD & questionable pulmonary edema #High risk for Aspiration -Supplemental O2 as needed to maintain O2 sats >92% -BiPAP if needed ~ High risk for Intubation -Follow intermittent Chest X-ray & ABG as needed -Bronchodilators  -IV Steroids -ABX as above -Diuresis as BP and renal function permits ~ started on 40 mg Lasix  BID -Pulmonary toilet as able  #Acute Metabolic Encephalopathy- improved  #Developing Alcohol Withdrawal #Hyperammonemia  PMHx: ETOH abuse, polysubstance abuse -Treatment of metabolic derangements as outlined above -Provide supportive care -Promote normal sleep/wake cycle and family presence -Avoid sedating medications as able -CIWA protocol -Continue Lactulose   #Sepsis in the setting of Acute Gastroenteritis vs Chronic Diarrhea  (Meets SIRS Criteria on 1/2: HR >90, RR >20) #High risk for aspiration -Monitor fever curve -Trend WBC's & Procalcitonin -Follow cultures as above -Continue empiric Ceftriaxone  and Flagyl   pending cultures & sensitivities  #Alcoholic Hepatitis #Multifactorial Chronic Liver Disease, with concern for progression to Cirrhosis #Ascites Status post paracentesis on 12/31, fluid and cultures not supportive of SBP -Trend LFT's and coags -GI following, appreciate input  #Acute Kidney Injury -Monitor I&O's / urinary output -Follow BMP -Ensure adequate renal perfusion -Avoid nephrotoxic agents as able -Replace electrolytes as indicated ~ Pharmacy following for assistance with electrolyte replacement      Best Practice (right click and Reselect all SmartList Selections daily)   Diet/type: NPO DVT prophylaxis: prophylactic heparin   GI prophylaxis: PPI Lines: N/A Foley:  N/A Code Status:  full code Last date of multidisciplinary goals of care discussion [1/2]    Labs   CBC: Recent Labs  Lab 04/09/23 1746 04/10/23 0455 04/11/23 0402 04/12/23 0758 04/13/23 0540  WBC 16.0* 14.3* 15.2* 8.9 11.7*  NEUTROABS 13.3*  --   --   --   --  HGB 10.7* 9.7* 9.6* 10.2* 9.4*  HCT 31.5* 28.1* 28.3* 30.8* 29.3*  MCV 101.0* 103.3* 103.7* 102.7* 106.9*  PLT 172 136* 152 150 158    Basic Metabolic Panel: Recent Labs  Lab 04/10/23 1433 04/11/23 0402 04/11/23 1700 04/12/23 0758 04/12/23 0804 04/13/23 0540  NA 135 135 134*  --  135 138  K 3.0* 3.2* 3.8  --  3.8 3.9  CL 96* 98 98  --  97* 97*  CO2 24 23 23   --  24 28  GLUCOSE 128* 126* 137*  --  125* 140*  BUN 6 6 6   --  7 6  CREATININE 1.61* 1.47* 1.49*  --  1.25* 1.36*  CALCIUM 7.6* 7.7* 7.6*  --  8.1* 8.0*  MG 1.9 1.6* 2.1 2.2  --  1.8  PHOS 1.2* <1.0* 1.7* 2.0*  --  4.3   GFR: Estimated Creatinine Clearance: 60.3 mL/min (A) (by C-G formula based on SCr of 1.36 mg/dL (H)). Recent Labs  Lab 04/09/23 1746 04/09/23 1941  04/09/23 2214 04/10/23 0455 04/10/23 0618 04/11/23 0402 04/12/23 0758 04/13/23 0540  PROCALCITON 2.07  --   --   --   --   --   --   --   WBC 16.0*  --   --  14.3*  --  15.2* 8.9 11.7*  LATICACIDVEN  --  6.2* 5.1*  --  1.8  --   --   --     Liver Function Tests: Recent Labs  Lab 04/10/23 0455 04/11/23 0402 04/12/23 0758 04/12/23 0804 04/13/23 0540  AST 103* 107* 101* 100* 75*  ALT 36 36 44 42 38  ALKPHOS 305* 296* 302* 295* 278*  BILITOT 5.4* 4.2* 3.3* 3.5* 2.5*  PROT 6.4* 6.7 7.3 7.4 6.7  ALBUMIN  2.4* 2.9* 3.1* 3.0* 2.8*   Recent Labs  Lab 04/09/23 1144  LIPASE 33   Recent Labs  Lab 04/09/23 2214 04/10/23 0455 04/11/23 0402 04/12/23 0758 04/13/23 0540  AMMONIA 97* 95* 104* 103* 80*    ABG    Component Value Date/Time   PHART 7.35 04/12/2023 0728   PCO2ART 51 (H) 04/12/2023 0728   PO2ART 69 (L) 04/12/2023 0728   HCO3 28.2 (H) 04/12/2023 0728   O2SAT 94.8 04/12/2023 0728     Coagulation Profile: Recent Labs  Lab 04/09/23 1746  INR 1.3*    Cardiac Enzymes: No results for input(s): CKTOTAL, CKMB, CKMBINDEX, TROPONINI in the last 168 hours.  HbA1C: HB A1C (BAYER DCA - WAIVED)  Date/Time Value Ref Range Status  02/05/2023 03:03 PM 6.1 (H) 4.8 - 5.6 % Final    Comment:             Prediabetes: 5.7 - 6.4          Diabetes: >6.4          Glycemic control for adults with diabetes: <7.0   10/01/2018 04:30 PM 5.3 <7.0 % Final    Comment:                                          Diabetic Adult            <7.0                                       Healthy  Adult        4.3 - 5.7                                                           (DCCT/NGSP) American Diabetes Association's Summary of Glycemic Recommendations for Adults with Diabetes: Hemoglobin A1c <7.0%. More stringent glycemic goals (A1c <6.0%) may further reduce complications at the cost of increased risk of hypoglycemia.    Hgb A1c MFr Bld  Date/Time Value Ref Range Status   10/27/2022 05:04 PM 6.6 (H) 4.8 - 5.6 % Final    Comment:    (NOTE)         Prediabetes: 5.7 - 6.4         Diabetes: >6.4         Glycemic control for adults with diabetes: <7.0   12/10/2019 02:17 PM 5.8 (H) 4.8 - 5.6 % Final    Comment:    (NOTE) Pre diabetes:          5.7%-6.4%  Diabetes:              >6.4%  Glycemic control for   <7.0% adults with diabetes     CBG: Recent Labs  Lab 04/11/23 0813 04/12/23 0805 04/12/23 1118 04/13/23 0744  GLUCAP 136* 128* 129* 137*    Review of Systems:   Unable to assess due to AMS  Past Medical History:  She,  has a past medical history of Alcohol abuse, Anxiety, Biliary calculi (12/07/2009), Cholelithiasis, Depression, Diet-controlled diabetes mellitus (HCC), GERD (gastroesophageal reflux disease), Hepatitis C, LVH (left ventricular hypertrophy), Morbid obesity (HCC), Palpitations, Pancreatic pseudocyst, Retained intrauterine contraceptive device (IUD) (01/20/2020), and Substance abuse (HCC).   Surgical History:   Past Surgical History:  Procedure Laterality Date   CHOLECYSTECTOMY     2011   COLONOSCOPY WITH PROPOFOL  N/A 09/20/2022   Procedure: COLONOSCOPY WITH PROPOFOL ;  Surgeon: Jinny Carmine, MD;  Location: [facility] ENDOSCOPY;  Service: Endoscopy;  Laterality: N/A;   ESOPHAGOGASTRODUODENOSCOPY N/A 09/29/2021   Procedure: ESOPHAGOGASTRODUODENOSCOPY (EGD);  Surgeon: Jinny Carmine, MD;  Location: [facility] ENDOSCOPY;  Service: Endoscopy;  Laterality: N/A;   ESOPHAGOGASTRODUODENOSCOPY (EGD) WITH PROPOFOL  N/A 01/21/2018   Procedure: ESOPHAGOGASTRODUODENOSCOPY (EGD) WITH Biopsy;  Surgeon: Jinny Carmine, MD;  Location: [facility];  Service: Endoscopy;  Laterality: N/A;   HYSTEROSCOPY WITH D & C N/A 01/20/2020   Procedure: DILATATION AND CURETTAGE /HYSTEROSCOPY;  Surgeon: Leonce Garnette BIRCH, MD;  Location: [facility] ORS;  Service: Gynecology;  Laterality: N/A;   INTRAUTERINE DEVICE (IUD) INSERTION N/A 01/20/2020   Procedure: INTRAUTERINE  DEVICE (IUD) INSERTION MIRENA  IUD;  Surgeon: Leonce Garnette BIRCH, MD;  Location: [facility] ORS;  Service: Gynecology;  Laterality: N/A;   IUD REMOVAL N/A 01/20/2020   Procedure: INTRAUTERINE DEVICE (IUD) REMOVAL;  Surgeon: Leonce Garnette BIRCH, MD;  Location: [facility] ORS;  Service: Gynecology;  Laterality: N/A;   PORT-A-CATH REMOVAL  02/08/2022   PORTA CATH INSERTION N/A 01/01/2019   Procedure: PORTA CATH INSERTION;  Surgeon: Marea Selinda RAMAN, MD;  Location: [facility] INVASIVE CV LAB;  Service: Cardiovascular;  Laterality: N/A;   VASCULAR SURGERY       Social History:   reports that she has been smoking cigarettes. She has a 12 pack-year smoking history. She has never used smokeless tobacco. She reports current alcohol use. She reports current drug use. Drug:  Marijuana.   Family History:  Her family history includes Breast cancer in her paternal grandmother; Diabetes in her father; Heart disease in her paternal grandfather; Hypertension in her father, maternal grandfather, and mother; Liver cancer in her maternal grandmother.   Allergies Allergies  Allergen Reactions   Penicillins Swelling    Tolerated ceftriaxone  and cefepime  in past per [facility] and [facility] records     Home Medications  Prior to Admission medications   Medication Sig Start Date End Date Taking? Authorizing Provider  citalopram  (CELEXA ) 20 MG tablet TAKE 1.5 TABLET(20 MG) BY MOUTH DAILY 02/20/23  Yes Johnson, Megan P, DO  famotidine  (PEPCID ) 20 MG tablet Take 20 mg by mouth 2 (two) times daily.   Yes [provider]  folic acid  (FOLVITE ) 1 MG tablet Take 1 tablet (1 mg total) by mouth daily. 02/13/23  Yes Regalado, Belkys A, MD  Iron , Ferrous Sulfate , 325 (65 Fe) MG TABS Take 325 mg by mouth daily. 03/07/23  Yes Johnson, Megan P, DO  lidocaine  (LIDODERM ) 5 % Place 1 patch onto the skin daily. Remove & Discard patch within 12 hours or as directed by MD 04/28/22  Yes Johnson, Megan P, DO  MAGNESIUM  PO Take 1 each by mouth in the morning and  at bedtime. OTC patient is unsure of the dost   Yes [provider]  methocarbamol  (ROBAXIN ) 500 MG tablet Take 1 tablet (500 mg total) by mouth 2 (two) times daily. 10/20/22  Yes Brimage, Vondra, DO  metoprolol  succinate (TOPROL -XL) 25 MG 24 hr tablet Take 1 tablet (25 mg total) by mouth daily. 02/20/23  Yes Johnson, Megan P, DO  Multiple Vitamin (MULTIVITAMIN WITH MINERALS) TABS tablet Take 1 tablet by mouth daily. 02/13/23  Yes Regalado, Belkys A, MD  pantoprazole  (PROTONIX ) 40 MG tablet Take 1 tablet (40 mg total) by mouth 2 (two) times daily before a meal. 02/20/23 04/09/23 Yes Johnson, Megan P, DO  potassium chloride  SA (KLOR-CON  M) 20 MEQ tablet TAKE 1 TABLET(20 MEQ) BY MOUTH DAILY 03/20/23  Yes Johnson, Megan P, DO  sucralfate  (CARAFATE ) 1 GM/10ML suspension Take 10 mLs (1 g total) by mouth 4 (four) times daily -  with meals and at bedtime. 08/17/22  Yes Johnson, Megan P, DO  thiamine  (VITAMIN B-1) 100 MG tablet Take 1 tablet (100 mg total) by mouth daily. 02/20/23  Yes Johnson, Megan P, DO  doxycycline  (VIBRA -TABS) 100 MG tablet Take 1 tablet (100 mg total) by mouth 2 (two) times daily. Patient not taking: Reported on 04/09/2023 02/26/23   Vicci Duwaine SQUIBB, DO     Critical care provider statement:   Total critical care time: 33 minutes   Performed by: Parris MD   Critical care time was exclusive of separately billable procedures and treating other patients.   Critical care was necessary to treat or prevent imminent or life-threatening deterioration.   Critical care was time spent personally by me on the following activities: development of treatment plan with patient and/or surrogate as well as nursing, discussions with consultants, evaluation of patient's response to treatment, examination of patient, obtaining history from patient or surrogate, ordering and performing treatments and interventions, ordering and review of laboratory studies, ordering and review of radiographic  studies, pulse oximetry and re-evaluation of patient's condition.    Zyria Fiscus, M.D.  Pulmonary & Critical Care Medicine

## 2023-04-13 NOTE — Progress Notes (Signed)
 PHARMACY CONSULT NOTE  Pharmacy Consult for Electrolyte Monitoring and Replacement   Recent Labs: Potassium (mmol/L)  Date Value  04/13/2023 3.9   Magnesium  (mg/dL)  Date Value  98/96/7974 1.8   Calcium (mg/dL)  Date Value  98/96/7974 8.0 (L)   Albumin  (g/dL)  Date Value  98/96/7974 2.8 (L)  03/06/2023 3.1 (L)   Phosphorus (mg/dL)  Date Value  98/96/7974 4.3   Sodium (mmol/L)  Date Value  04/13/2023 138  03/06/2023 145 (H)   Assessment: Patient is a 38yo female with electrolyte disturbance. Pharmacy consulted for replacement. Patient received KCl 10mEq IV x 2 and Mag Sulfate 2g IV x 2 in the ED.  Goal of Therapy: Electrolytes within normal limits  Plan:  --Mg 1.8, magnesium  sulfate 2 g IV x 1 --Continue to follow along with labs tomorrow AM as ordered  Thank you for involving pharmacy in this patient's care.   Marolyn KATHEE Mare 04/13/2023 7:33 AM

## 2023-04-14 ENCOUNTER — Inpatient Hospital Stay: Payer: Medicaid Other

## 2023-04-14 LAB — HEPATIC FUNCTION PANEL
ALT: 36 U/L (ref 0–44)
AST: 69 U/L — ABNORMAL HIGH (ref 15–41)
Albumin: 2.7 g/dL — ABNORMAL LOW (ref 3.5–5.0)
Alkaline Phosphatase: 269 U/L — ABNORMAL HIGH (ref 38–126)
Bilirubin, Direct: 1.1 mg/dL — ABNORMAL HIGH (ref 0.0–0.2)
Indirect Bilirubin: 1.1 mg/dL — ABNORMAL HIGH (ref 0.3–0.9)
Total Bilirubin: 2.2 mg/dL — ABNORMAL HIGH (ref 0.0–1.2)
Total Protein: 6.6 g/dL (ref 6.5–8.1)

## 2023-04-14 LAB — BODY FLUID CELL COUNT WITH DIFFERENTIAL
Eos, Fluid: 0 %
Lymphs, Fluid: 40 %
Monocyte-Macrophage-Serous Fluid: 45 %
Neutrophil Count, Fluid: 15 %
Total Nucleated Cell Count, Fluid: 113 uL

## 2023-04-14 LAB — BASIC METABOLIC PANEL
Anion gap: 12 (ref 5–15)
BUN: 7 mg/dL (ref 6–20)
CO2: 32 mmol/L (ref 22–32)
Calcium: 8.5 mg/dL — ABNORMAL LOW (ref 8.9–10.3)
Chloride: 98 mmol/L (ref 98–111)
Creatinine, Ser: 1.4 mg/dL — ABNORMAL HIGH (ref 0.44–1.00)
GFR, Estimated: 50 mL/min — ABNORMAL LOW (ref >60)
Glucose, Bld: 142 mg/dL — ABNORMAL HIGH (ref 70–99)
Potassium: 3.3 mmol/L — ABNORMAL LOW (ref 3.5–5.1)
Sodium: 142 mmol/L (ref 135–145)

## 2023-04-14 LAB — CULTURE, BLOOD (ROUTINE X 2)
Culture: NO GROWTH
Culture: NO GROWTH

## 2023-04-14 LAB — CBC
HCT: 30.4 % — ABNORMAL LOW (ref 36.0–46.0)
Hemoglobin: 9.5 g/dL — ABNORMAL LOW (ref 12.0–15.0)
MCH: 34.1 pg — ABNORMAL HIGH (ref 26.0–34.0)
MCHC: 31.3 g/dL (ref 30.0–36.0)
MCV: 109 fL — ABNORMAL HIGH (ref 80.0–100.0)
Platelets: 177 10*3/uL (ref 150–400)
RBC: 2.79 MIL/uL — ABNORMAL LOW (ref 3.87–5.11)
RDW: 17.2 % — ABNORMAL HIGH (ref 11.5–15.5)
WBC: 11.2 10*3/uL — ABNORMAL HIGH (ref 4.0–10.5)
nRBC: 0.2 % (ref 0.0–0.2)

## 2023-04-14 LAB — AMMONIA: Ammonia: 103 umol/L — ABNORMAL HIGH (ref 9–35)

## 2023-04-14 LAB — GLUCOSE, CAPILLARY: Glucose-Capillary: 142 mg/dL — ABNORMAL HIGH (ref 70–99)

## 2023-04-14 LAB — MAGNESIUM: Magnesium: 2.2 mg/dL (ref 1.7–2.4)

## 2023-04-14 LAB — PHOSPHORUS: Phosphorus: 3.5 mg/dL (ref 2.5–4.6)

## 2023-04-14 MED ORDER — LACTULOSE 10 GM/15ML PO SOLN
30.0000 g | Freq: Two times a day (BID) | ORAL | Status: DC
  Administered 2023-04-15: 30 g via ORAL
  Filled 2023-04-14: qty 60

## 2023-04-14 MED ORDER — ALPRAZOLAM 0.5 MG PO TABS
0.5000 mg | ORAL_TABLET | Freq: Two times a day (BID) | ORAL | Status: DC | PRN
  Administered 2023-04-16 – 2023-04-19 (×5): 0.5 mg via ORAL
  Filled 2023-04-14 (×5): qty 1

## 2023-04-14 MED ORDER — ALBUMIN HUMAN 25 % IV SOLN: 12.5000 g | Freq: Once | INTRAVENOUS | Status: AC

## 2023-04-14 MED ORDER — POTASSIUM CHLORIDE CRYS ER 20 MEQ PO TBCR
40.0000 meq | EXTENDED_RELEASE_TABLET | ORAL | Status: AC
Start: 2023-04-14 — End: 2023-04-14
  Administered 2023-04-14 (×2): 40 meq via ORAL
  Filled 2023-04-14 (×2): qty 2

## 2023-04-14 MED ORDER — ALBUMIN HUMAN 25 % IV SOLN
INTRAVENOUS | Status: AC
  Administered 2023-04-14: 12.5 g via INTRAVENOUS
  Filled 2023-04-14: qty 50

## 2023-04-14 NOTE — Progress Notes (Signed)
 NAME:  Katie Stanley, MRN:  [id number], DOB:  Jul 04, 1985, LOS: 5 ADMISSION DATE:  04/09/2023, CONSULTATION DATE:  04/12/2023 REFERRING MD:  Dr. Von, CHIEF COMPLAINT:  ETOH withdrawal, Acute Respiratory Distress   Brief Pt Description / Synopsis:  38 y.o. female with past medical history significant for ETOH and polysubstance abuse admitted with Acute Gastroenteritis, Alcoholic Hepatitis, Chronic Liver disease with concern for progression to Cirrhosis, ascites, and hyperammonemia.  Course complicated by developing Alcohol Withdrawal and Acute Hypoxic Respiratory Failure.  History of Present Illness:  Katie Stanley is a 38 y.o. female with PMH of diet-controlled diabetes, chronic liver disease-MAFLD/MASH, self cleared HCV, HAV,   polysubstance abuse, tobacco abuse, alcohol abuse, GERD, depression with anxiety, anemia, obesity, who presented to [facility] ED on 04/09/23 with nausea, vomiting, diarrhea, abdominal pain, abdominal distention.   Pt is currently altered and unable to contribute to history and no family is currently available, therefore history is obtained from chart review.  04/15/23- patient is slightly improved today. She had distended abdomen.  US  abdomen done at bedside with significant ascites. Plan for paracentesis today. Encephalopathy improving.    Pertinent  Medical History   Past Medical History:  Diagnosis Date   Alcohol abuse    Anxiety    Biliary calculi 12/07/2009   Cholelithiasis    Depression    Diet-controlled diabetes mellitus (HCC)    GERD (gastroesophageal reflux disease)    Hepatitis C    body self healed per patient   LVH (left ventricular hypertrophy)    a. 10/2022 Echo: EF 60-65%, no rwma, sev conc LVH, nl RV size/fxn, triv MR. Mobile density near ant MV leaflet- felt to be redundant chordae tendinae..   Morbid obesity (HCC)    Palpitations    a. 07/2019 Holter: Sinus rhythm, PVCs, rare PACs.  No significant arrhythmias.   Pancreatic pseudocyst    a.  02/2023 MRI abd: improving unilocular pancreatic pseudocyst.   Retained intrauterine contraceptive device (IUD) 01/20/2020   Substance abuse (HCC)     Micro Data:  12/30: COVID-19/RSV/FLU PCR>>negative 12/30: C. Diff/GI panel PCR>>negative 12/30: Blood culture x2>> no growth to date 12/31: Peritoneal fluid from paracentesis>> no growth to date  Antimicrobials:   Anti-infectives (From admission, onward)    Start     Dose/Rate Route Frequency Ordered Stop   04/10/23 1400  cefTRIAXone  (ROCEPHIN ) 2 g in sodium chloride  0.9 % 100 mL IVPB        2 g 200 mL/hr over 30 Minutes Intravenous Every 24 hours 04/10/23 0845 04/16/23 2359   04/10/23 0900  metroNIDAZOLE  (FLAGYL ) IVPB 500 mg        500 mg 100 mL/hr over 60 Minutes Intravenous Every 12 hours 04/09/23 2113 04/15/23 0859   04/10/23 0600  aztreonam  (AZACTAM ) 2 g in sodium chloride  0.9 % 100 mL IVPB  Status:  Discontinued        2 g 200 mL/hr over 30 Minutes Intravenous Every 8 hours 04/09/23 2132 04/10/23 0845   04/09/23 2030  aztreonam  (AZACTAM ) 2 g in sodium chloride  0.9 % 100 mL IVPB        2 g 200 mL/hr over 30 Minutes Intravenous  Once 04/09/23 2016 04/09/23 2151   04/09/23 2030  metroNIDAZOLE  (FLAGYL ) IVPB 500 mg        500 mg 100 mL/hr over 60 Minutes Intravenous  Once 04/09/23 2016 04/09/23 2352   04/09/23 2030  vancomycin  (VANCOCIN ) IVPB 1000 mg/200 mL premix        1,000  mg 200 mL/hr over 60 Minutes Intravenous  Once 04/09/23 2016 04/10/23 0118       Significant Hospital Events: Including procedures, antibiotic start and stop dates in addition to other pertinent events   12/30: Presented to ED with N/V/D/Abdominal pain.  Admitted by Hospitalists 12/31: Underwent Paracentesis with 5 L fluid removed.  GI & Palliative Care consulted. 1/1: Late in the evening with hallucinations and anxiety concerning for developing ETOH withdrawal, also with increased WOB 1/2: Continues with increased WOB, PCCM consulted.  Transfer to  [facility] given high risk for further decompensation.  Started on Lasix  and BD due to concern for Pulmonary Edema and AECOPD. e  Objective   Blood pressure 130/73, pulse 91, temperature 98 F (36.7 C), temperature source Oral, resp. rate (!) 29, height 5' 3 (1.6 m), weight 90 kg, SpO2 93%.        Intake/Output Summary (Last 24 hours) at 04/14/2023 0910 Last data filed at 04/14/2023 0809 Gross per 24 hour  Intake 1453.91 ml  Output 2227 ml  Net -773.09 ml   Filed Weights   04/09/23 1110 04/09/23 1113 04/12/23 1112  Weight: 88.5 kg 88 kg 90 kg    Examination: General: Acute on chronically ill appearing obese female, sitting in bed, with mild respiratory distress HENT: Atraumatic, normocephalic, neck supple, no JVD Lungs: Coarse breath sounds throughout, even, mild tachypnea and abdominal muscle use Cardiovascular:  Tachycardia, regular rhythm, s1s2, no M/G/R Abdomen: Obese, distended Extremities: Normal bulk and tone, no deformities, no edema, no cyanosis Neuro: Lethargic, arouses easily to voice, oriented to person and place, moves all extremities to commands, no focal deficits noted, pupils PERRL GU: Deferred    Assessment & Plan:   #Acute Hypoxic Respiratory Failure, suspect due to AECOPD & questionable pulmonary edema #High risk for Aspiration -Supplemental O2 as needed to maintain O2 sats >92% -BiPAP if needed ~ High risk for Intubation -Follow intermittent Chest X-ray & ABG as needed -Bronchodilators  -IV Steroids have been dcd -ABX as above -Diuresis as BP and renal function permits ~ started on 40 mg Lasix  BID -Pulmonary toilet as able  #Acute Metabolic Encephalopathy- improved  #Developing Alcohol Withdrawal #Hyperammonemia  PMHx: ETOH abuse, polysubstance abuse -Treatment of metabolic derangements as outlined above -Provide supportive care -Promote normal sleep/wake cycle and family presence -Avoid sedating medications as able -CIWA protocol -Continue  Lactulose   #Sepsis in the setting of Acute Gastroenteritis  (PRESENT ON ADMISSION)  vs Chronic Diarrhea (Meets SIRS Criteria on 1/2: HR >90, RR >20) #High risk for aspiration -Monitor fever curve -Trend WBC's & Procalcitonin -Follow cultures as above -Continue empiric Ceftriaxone  and Flagyl   pending cultures & sensitivities  #Alcoholic Hepatitis DF IS <30 #Multifactorial Chronic Liver Disease, with concern for progression to Cirrhosis #Ascites Status post paracentesis on 12/31, fluid and cultures not supportive of SBP -Trend LFT's and coags -GI following, appreciate input  #Acute Kidney Injury- KDIGO 2 -Monitor I&O's / urinary output -Follow BMP -Ensure adequate renal perfusion -Avoid nephrotoxic agents as able -Replace electrolytes as indicated ~ Pharmacy following for assistance with electrolyte replacement      Best Practice (right click and Reselect all SmartList Selections daily)   Diet/type: NPO DVT prophylaxis: prophylactic heparin   GI prophylaxis: PPI Lines: N/A Foley:  N/A Code Status:  full code Last date of multidisciplinary goals of care discussion [1/2]    Labs   CBC: Recent Labs  Lab 04/09/23 1746 04/10/23 0455 04/11/23 0402 04/12/23 0758 04/13/23 0540 04/14/23 0300  WBC 16.0* 14.3* 15.2*  8.9 11.7* 11.2*  NEUTROABS 13.3*  --   --   --   --   --   HGB 10.7* 9.7* 9.6* 10.2* 9.4* 9.5*  HCT 31.5* 28.1* 28.3* 30.8* 29.3* 30.4*  MCV 101.0* 103.3* 103.7* 102.7* 106.9* 109.0*  PLT 172 136* 152 150 158 177    Basic Metabolic Panel: Recent Labs  Lab 04/11/23 0402 04/11/23 1700 04/12/23 0758 04/12/23 0804 04/13/23 0540 04/14/23 0300  NA 135 134*  --  135 138 142  K 3.2* 3.8  --  3.8 3.9 3.3*  CL 98 98  --  97* 97* 98  CO2 23 23  --  24 28 32  GLUCOSE 126* 137*  --  125* 140* 142*  BUN 6 6  --  7 6 7   CREATININE 1.47* 1.49*  --  1.25* 1.36* 1.40*  CALCIUM 7.7* 7.6*  --  8.1* 8.0* 8.5*  MG 1.6* 2.1 2.2  --  1.8 2.2  PHOS <1.0* 1.7* 2.0*   --  4.3 3.5   GFR: Estimated Creatinine Clearance: 58.5 mL/min (A) (by C-G formula based on SCr of 1.4 mg/dL (H)). Recent Labs  Lab 04/09/23 1746 04/09/23 1941 04/09/23 2214 04/10/23 0455 04/10/23 0618 04/11/23 0402 04/12/23 0758 04/13/23 0540 04/14/23 0300  PROCALCITON 2.07  --   --   --   --   --   --   --   --   WBC 16.0*  --   --    < >  --  15.2* 8.9 11.7* 11.2*  LATICACIDVEN  --  6.2* 5.1*  --  1.8  --   --   --   --    < > = values in this interval not displayed.    Liver Function Tests: Recent Labs  Lab 04/11/23 0402 04/12/23 0758 04/12/23 0804 04/13/23 0540 04/14/23 0300  AST 107* 101* 100* 75* 69*  ALT 36 44 42 38 36  ALKPHOS 296* 302* 295* 278* 269*  BILITOT 4.2* 3.3* 3.5* 2.5* 2.2*  PROT 6.7 7.3 7.4 6.7 6.6  ALBUMIN  2.9* 3.1* 3.0* 2.8* 2.7*   Recent Labs  Lab 04/09/23 1144  LIPASE 33   Recent Labs  Lab 04/10/23 0455 04/11/23 0402 04/12/23 0758 04/13/23 0540 04/14/23 0300  AMMONIA 95* 104* 103* 80* 103*    ABG    Component Value Date/Time   PHART 7.35 04/12/2023 0728   PCO2ART 51 (H) 04/12/2023 0728   PO2ART 69 (L) 04/12/2023 0728   HCO3 28.2 (H) 04/12/2023 0728   O2SAT 94.8 04/12/2023 0728     Coagulation Profile: Recent Labs  Lab 04/09/23 1746  INR 1.3*    Cardiac Enzymes: No results for input(s): CKTOTAL, CKMB, CKMBINDEX, TROPONINI in the last 168 hours.  HbA1C: HB A1C (BAYER DCA - WAIVED)  Date/Time Value Ref Range Status  02/05/2023 03:03 PM 6.1 (H) 4.8 - 5.6 % Final    Comment:             Prediabetes: 5.7 - 6.4          Diabetes: >6.4          Glycemic control for adults with diabetes: <7.0   10/01/2018 04:30 PM 5.3 <7.0 % Final    Comment:                                          Diabetic Adult            <  7.0                                       Healthy Adult        4.3 - 5.7                                                           (DCCT/NGSP) American Diabetes Association's Summary of Glycemic  Recommendations for Adults with Diabetes: Hemoglobin A1c <7.0%. More stringent glycemic goals (A1c <6.0%) may further reduce complications at the cost of increased risk of hypoglycemia.    Hgb A1c MFr Bld  Date/Time Value Ref Range Status  10/27/2022 05:04 PM 6.6 (H) 4.8 - 5.6 % Final    Comment:    (NOTE)         Prediabetes: 5.7 - 6.4         Diabetes: >6.4         Glycemic control for adults with diabetes: <7.0   12/10/2019 02:17 PM 5.8 (H) 4.8 - 5.6 % Final    Comment:    (NOTE) Pre diabetes:          5.7%-6.4%  Diabetes:              >6.4%  Glycemic control for   <7.0% adults with diabetes     CBG: Recent Labs  Lab 04/11/23 0813 04/12/23 0805 04/12/23 1118 04/13/23 0744 04/14/23 0806  GLUCAP 136* 128* 129* 137* 142*    Review of Systems:   Unable to assess due to AMS  Past Medical History:  She,  has a past medical history of Alcohol abuse, Anxiety, Biliary calculi (12/07/2009), Cholelithiasis, Depression, Diet-controlled diabetes mellitus (HCC), GERD (gastroesophageal reflux disease), Hepatitis C, LVH (left ventricular hypertrophy), Morbid obesity (HCC), Palpitations, Pancreatic pseudocyst, Retained intrauterine contraceptive device (IUD) (01/20/2020), and Substance abuse (HCC).   Surgical History:   Past Surgical History:  Procedure Laterality Date   CHOLECYSTECTOMY     2011   COLONOSCOPY WITH PROPOFOL  N/A 09/20/2022   Procedure: COLONOSCOPY WITH PROPOFOL ;  Surgeon: Jinny Carmine, MD;  Location: [facility] ENDOSCOPY;  Service: Endoscopy;  Laterality: N/A;   ESOPHAGOGASTRODUODENOSCOPY N/A 09/29/2021   Procedure: ESOPHAGOGASTRODUODENOSCOPY (EGD);  Surgeon: Jinny Carmine, MD;  Location: [facility] ENDOSCOPY;  Service: Endoscopy;  Laterality: N/A;   ESOPHAGOGASTRODUODENOSCOPY (EGD) WITH PROPOFOL  N/A 01/21/2018   Procedure: ESOPHAGOGASTRODUODENOSCOPY (EGD) WITH Biopsy;  Surgeon: Jinny Carmine, MD;  Location: [facility];  Service: Endoscopy;  Laterality: N/A;    HYSTEROSCOPY WITH D & C N/A 01/20/2020   Procedure: DILATATION AND CURETTAGE /HYSTEROSCOPY;  Surgeon: Leonce Garnette BIRCH, MD;  Location: [facility] ORS;  Service: Gynecology;  Laterality: N/A;   INTRAUTERINE DEVICE (IUD) INSERTION N/A 01/20/2020   Procedure: INTRAUTERINE DEVICE (IUD) INSERTION MIRENA  IUD;  Surgeon: Leonce Garnette BIRCH, MD;  Location: [facility] ORS;  Service: Gynecology;  Laterality: N/A;   IUD REMOVAL N/A 01/20/2020   Procedure: INTRAUTERINE DEVICE (IUD) REMOVAL;  Surgeon: Leonce Garnette BIRCH, MD;  Location: [facility] ORS;  Service: Gynecology;  Laterality: N/A;   PORT-A-CATH REMOVAL  02/08/2022   PORTA CATH INSERTION N/A 01/01/2019   Procedure: PORTA CATH INSERTION;  Surgeon: Marea Selinda RAMAN, MD;  Location: [facility] INVASIVE CV LAB;  Service: Cardiovascular;  Laterality: N/A;  VASCULAR SURGERY       Social History:   reports that she has been smoking cigarettes. She has a 12 pack-year smoking history. She has never used smokeless tobacco. She reports current alcohol use. She reports current drug use. Drug: Marijuana.   Family History:  Her family history includes Breast cancer in her paternal grandmother; Diabetes in her father; Heart disease in her paternal grandfather; Hypertension in her father, maternal grandfather, and mother; Liver cancer in her maternal grandmother.   Allergies Allergies  Allergen Reactions   Penicillins Swelling    Tolerated ceftriaxone  and cefepime  in past per [facility] records     Home Medications  Prior to Admission medications   Medication Sig Start Date End Date Taking? Authorizing Provider  citalopram  (CELEXA ) 20 MG tablet TAKE 1.5 TABLET(20 MG) BY MOUTH DAILY 02/20/23  Yes Johnson, Megan P, DO  famotidine  (PEPCID ) 20 MG tablet Take 20 mg by mouth 2 (two) times daily.   Yes [provider]  folic acid  (FOLVITE ) 1 MG tablet Take 1 tablet (1 mg total) by mouth daily. 02/13/23  Yes Regalado, Belkys A, MD  Iron , Ferrous Sulfate , 325 (65 Fe) MG TABS Take  325 mg by mouth daily. 03/07/23  Yes Johnson, Megan P, DO  lidocaine  (LIDODERM ) 5 % Place 1 patch onto the skin daily. Remove & Discard patch within 12 hours or as directed by MD 04/28/22  Yes Johnson, Megan P, DO  MAGNESIUM  PO Take 1 each by mouth in the morning and at bedtime. OTC patient is unsure of the dost   Yes [provider]  methocarbamol  (ROBAXIN ) 500 MG tablet Take 1 tablet (500 mg total) by mouth 2 (two) times daily. 10/20/22  Yes Brimage, Vondra, DO  metoprolol  succinate (TOPROL -XL) 25 MG 24 hr tablet Take 1 tablet (25 mg total) by mouth daily. 02/20/23  Yes Johnson, Megan P, DO  Multiple Vitamin (MULTIVITAMIN WITH MINERALS) TABS tablet Take 1 tablet by mouth daily. 02/13/23  Yes Regalado, Belkys A, MD  pantoprazole  (PROTONIX ) 40 MG tablet Take 1 tablet (40 mg total) by mouth 2 (two) times daily before a meal. 02/20/23 04/09/23 Yes Johnson, Megan P, DO  potassium chloride  SA (KLOR-CON  M) 20 MEQ tablet TAKE 1 TABLET(20 MEQ) BY MOUTH DAILY 03/20/23  Yes Johnson, Megan P, DO  sucralfate  (CARAFATE ) 1 GM/10ML suspension Take 10 mLs (1 g total) by mouth 4 (four) times daily -  with meals and at bedtime. 08/17/22  Yes Johnson, Megan P, DO  thiamine  (VITAMIN B-1) 100 MG tablet Take 1 tablet (100 mg total) by mouth daily. 02/20/23  Yes Johnson, Megan P, DO  doxycycline  (VIBRA -TABS) 100 MG tablet Take 1 tablet (100 mg total) by mouth 2 (two) times daily. Patient not taking: Reported on 04/09/2023 02/26/23   Vicci Duwaine SQUIBB, DO     Critical care provider statement:   Total critical care time: 33 minutes   Performed by: Parris MD   Critical care time was exclusive of separately billable procedures and treating other patients.   Critical care was necessary to treat or prevent imminent or life-threatening deterioration.   Critical care was time spent personally by me on the following activities: development of treatment plan with patient and/or surrogate as well as nursing,  discussions with consultants, evaluation of patient's response to treatment, examination of patient, obtaining history from patient or surrogate, ordering and performing treatments and interventions, ordering and review of laboratory studies, ordering and review of radiographic studies, pulse oximetry and re-evaluation  of patient's condition.    Jaqua Ching, M.D.  Pulmonary & Critical Care Medicine

## 2023-04-14 NOTE — Procedures (Signed)
    Paracentesis Procedure Note   Paracentesis Procedure Note    INDICATION: Abdominal ascites  PROCEDURE OPERATOR: Bowe Sidor MD  Ultrasound used to mark location: Yes    CONSENT:   Consent was obtained from PATIENT prior to the procedure. Indications, risks, and benefits were explained at length.       PROCEDURE SUMMARY:   A time-out was performed. My hands were washed immediately prior to the procedure. I wore a surgical cap, mask with protective eyewear, sterile gown and sterile gloves throughout the procedure. The area was cleansed and draped in usual sterile fashion using chlorhexidine  scrub. Anesthesia was achieved with 1% lidocaine . The medial aspect 4cm below umbilicu of the abdomen was prepped and draped in a sterile fashion using chlorhexidine  scrub. 1% lidocaine  was used to numb the skin, soft tissue and peritoneum. The paracentesis catheter was inserted and advanced with negative pressure until straw colored fluid was aspirated. Approximately 10 mL of ascitic fluid was collected and sent for laboratory analysis. The catheter was then connected to the vaccutainer and 1.4 liters of additional ascitic fluid were drained. The catheter was removed and no leaking was noted. A bandaid was placed over the puncture wound. The patient tolerated the procedure well without any immediate complications. Estimated blood loss was <1cc.      Hermann Dottavio, M.D.  Pulmonary & Critical Care Medicine  [facility]

## 2023-04-14 NOTE — Progress Notes (Signed)
 Patient had desat this morning at 0745. The patient was on 10 liters of oxygen at the beginning of the shift. Oxygen was dropping to 84% on 10 liters. The patient was placed on 15 liters of oxygen. CHARM Moose, NP was notified and came to the bedside to assess the patient. She had placed order for chest x-ray. RT was notified to provide PRN Duoneb.

## 2023-04-14 NOTE — Progress Notes (Signed)
 [facility] Progress Note  Patient: Katie Stanley    FMW:[id number]  DOA: 04/09/2023     Date of Service: the patient was seen and examined on 04/14/2023  Chief Complaint  Patient presents with   Chest Pain   Brief hospital course: Katie Stanley is a 38 y.o. female with PMH of diet-controlled diabetes, chronic liver disease-MAFLD/MASH, self cleared HCV, HAV,   polysubstance abuse, tobacco abuse, alcohol abuse, GERD, depression with anxiety, anemia, obesity, who presents with nausea, vomiting, diarrhea, abdominal pain, abdominal distention.   Patient was recently hospitalized from 11/1 - 11/4 due to multiple electrolytes disturbance and diarrhea.  Patient had positive C. difficile antigen, but negative toxigenic C. difficile PCR.  Vancomycin  was not continued at discharge after discussing with GI.   Patient states that she has nausea, vomiting, diarrhea and abdominal pain for more than 1 week this time.  She has more than 10 times of nonbilious nonbloody vomiting and more than 10 times of watery diarrhea each day.  Her abdominal pain is diffuse, constant, cramping, nonradiating, not alleviated or aggravated by any known factors.  She states that her abdomen has been gradually distended recently. No fever or chills.  Patient has mild dry cough and mild shortness or breath.  She also reports chest discomfort earlier, which has really resolved.  Currently no active chest pain.  Denies symptoms of UTI.     Data reviewed independently and ED Course: pt was found to have WBC 6.0, magnesium  1.1, potassium 2.8, AKI with creatinine 1.56, BUN <5.0, and GFR 44 (recent creatinine 1.17 on 03/06/2023, and 0.64 on 06/24/2022), troponin level 17, lactic acid 6.2, INR 1.3, negative PCR for COVID, flu and RSV, abnormal liver function (ALP 386, AST 119, ALT 38, total bilirubin 6.3, direct bilirubin 0.5), lipase 33.  Chest x-ray negative.  Patient is admitted to telemetry bed as inpatient.  Dr. Desiderio of  general surgery is consulted.   CT abdomen/pelvis: 1. Extensive inflammatory process involving small and large bowel. This could represent an infectious or inflammatory process versus ischemia. 2. Large amount of ascites. 3. Fatty liver. 4. Pancreatic lesion, smaller than on the prior study, presumably a pseudocyst. 5.  Hepatobiliary: Marked hepatic fatty infiltration. No focal lesions identified. No biliary ductal dilatation.   1/2 PCCM consulted, patient was transferred to stepdown unit for close monitoring on  1/3 still patient has respiratory distress requiring 8L via HFNC 1/4 1.4L paracentesis    Assessment and Plan:  # Acute gastroenteritis vs chronic diarrhea-Resolving  Patient presented with N/V/D and abdominal pain with distention CT scan shows inflammation of small and large intestine infectious versus inflammatory WBC and procalcitonin elevated Negative C. difficile and GI pathogen S/p IV fluid given in the ED Discontinued IV fluid due to significant ascites, vital signs remain stable. S/p vancomycin  given in the ED, discontinued aztreonam  Continue Flagyl  for 5 days, and ceftriaxone  for 7 days for possible SBP Patient has had 1-3 bowel movements since being in the hospital. She had increased output in her flexiseal due to lactulose .  -Decrease lactulose  to twice daily dosing.    # Ascites, possible SBP Chronic liver disease may have progressed to liver cirrhosis Elevated WBC count Procalcitonin 2.0 elevated Continue Flagyl  for 5 days, and ceftriaxone  for 7 days empirically for possible SBP since fluid studies obtained after initiaiton of antibiotics and elevated WBC count and procal on admission  S/p paracentesis 5 L fluid was tapped by IR on 12/31 S/p albumin  25 g IV one-time  dose on 12/31 Ascitic Fluid culture No growth, blood culture NGTD S/p paracentesis 1.4L fluid removed by PCCM 1/4 S/p albumin  12.5g x1  Fluid studies pending from most recent paracentesis   GI following On 1/2 started Lasix  40 mg IV twice daily>>>transition to PO lasix /spiro diuretic regimen in the AM as blood pressure allows Consider repeating abdominal ultrasound and paracentesis on Monday   # Acute hypoxic respiratory failure most likely due to pulmonary edema and COPD exacerbation Remains on HFNC.  On 1/2 started Solu-Medrol  40 mg IV twice daily for 2 days followed by 40 mg IV daily for 3 days, steroids were discontinued 1/4 Continue breo ellipta  and as needed duonebs Mucinex  extremity gram p.o. twice daily, Tussionex as needed for cough Continue Lasix  40 mg IV twice daily as BP allows Continue supplemental O2, wean as able    # Alcoholic hepatitis # Hyperbilirubinemia MDF 22 CT shows inflammation with fatty infiltration of liver No need for prednisone  per GI    Latest Ref Rng & Units 04/14/2023    3:00 AM 04/13/2023    5:40 AM 04/12/2023    8:04 AM  Hepatic Function  Total Protein 6.5 - 8.1 g/dL 6.6  6.7  7.4   Albumin  3.5 - 5.0 g/dL 2.7  2.8  3.0   AST 15 - 41 U/L 69  75  100   ALT 0 - 44 U/L 36  38  42   Alk Phosphatase 38 - 126 U/L 269  278  295   Total Bilirubin 0.0 - 1.2 mg/dL 2.2  2.5  3.5   Bilirubin, Direct 0.0 - 0.2 mg/dL 1.1  1.3    Will need formal cirrhosis eval outpatient    # 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 On lactulose , titrate to 2-3 BM per day -Decreased dose to 10mg  BID due to output    # AKI On 11/01/22 Cr 0.83, gradually getting worse Cr 1.75 on admission>1.25>1.36>1.4 Will transition patient to PO diuretic regimen in the AM  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 Avoid nephrotoxic medication, use renally dose medications Monitor renal function and urine output Daily      Latest Ref Rng & Units 04/14/2023    3:00 AM 04/13/2023    5:40 AM 04/12/2023    8:04 AM  BMP  Glucose 70 - 99 mg/dL 857  859  874   BUN 6 - 20 mg/dL 7  6  7    Creatinine 0.44 - 1.00 mg/dL 8.59  8.63  8.74   Sodium 135 - 145 mmol/L 142  138  135   Potassium 3.5 - 5.1  mmol/L 3.3  3.9  3.8   Chloride 98 - 111 mmol/L 98  97  97   CO2 22 - 32 mmol/L 32  28  24   Calcium 8.9 - 10.3 mg/dL 8.5  8.0  8.1      # Lactic acidosis most likely due to intravascular volume depletion/poor excretion of lactate  Lactic acid 5.1, improved repeat lactic acid 1.8 s/p IVF  # EtOH use disorder, Has liver dysfunction due to her alcohol use.  abstinence counseling done  Last drink was 3 days before admission Continue CIWA protocol and watch for withdrawal symptoms Continue multivitamin, folate and thiamine  Continue Librium   Discuss with patient if open to medications to help with EtOH use disorder in the AM   # History of polysubstance use, UDS positive for THC and opiates Drug abuse abstinence counseling done [facility] consulted   # GERD: Continue PPI and Carafate  -D/c  carafate    # Depression, continuedCelexa   # Macrocytic anemia and thrombocytopenia due to EtOH use Monitor H&H  # Isotonic hyponatremia, most likely nutritional deficiency. Resolved  serum osmolality 285 wnl # Hypokalemia, potassium repleted. # Hypophosphatemia, Phos repleted. # Hypomagnesemia, mag repleted. Monitor electrolytes and replete as needed.     Body mass index is 35.15 kg/m.  Interventions:  Diet: Regular diet DVT Prophylaxis: Subcutaneous Heparin     Advance goals of care discussion: Full code  Family Communication: family was not present at bedside, at the time of interview.  The pt provided permission to discuss medical plan with the family.  Disposition:  Pt is from Home, admitted with ascites, abdominal distention, still has Resp failure and electrolyte imbalance, which precludes a safe discharge. Discharge to home, when stable, may need few days to improve.  Subjective: Patient is drowsy on exam. She has no chest pain. Continues to have shortness of breath and mild abdominal pain.     Physical Exam: Constitutional: In no distress. Drowsy  Eyes: no scleral icterus   Cardiovascular: Normal rate, regular rhythm. No lower extremity edema  Pulmonary: Non labored breathing on [place], no wheezing or rales.  No lower extremity edema Abdominal: Soft. Distended, non tender  Neurological: Alert and oriented to person, place, and time. Non focal  Skin: Skin is warm and dry.    Vitals:   04/14/23 1358 04/14/23 1400 04/14/23 1500 04/14/23 1600  BP:  93/60 91/73 114/77  Pulse:  88 95 88  Resp:  15 15 18   Temp:   97.6 F (36.4 C)   TempSrc:   Oral   SpO2: (!) 87% 95% 93% 91%  Weight:      Height:        Intake/Output Summary (Last 24 hours) at 04/14/2023 1607 Last data filed at 04/14/2023 1223 Gross per 24 hour  Intake 561.52 ml  Output 2777 ml  Net -2215.48 ml    Filed Weights   04/09/23 1110 04/09/23 1113 04/12/23 1112  Weight: 88.5 kg 88 kg 90 kg    Data Reviewed: I have personally reviewed and interpreted daily labs, tele strips, imagings as discussed above. I reviewed all nursing notes, pharmacy notes, vitals, pertinent old records  CBC: Recent Labs  Lab 04/09/23 1746 04/10/23 0455 04/11/23 0402 04/12/23 0758 04/13/23 0540 04/14/23 0300  WBC 16.0* 14.3* 15.2* 8.9 11.7* 11.2*  NEUTROABS 13.3*  --   --   --   --   --   HGB 10.7* 9.7* 9.6* 10.2* 9.4* 9.5*  HCT 31.5* 28.1* 28.3* 30.8* 29.3* 30.4*  MCV 101.0* 103.3* 103.7* 102.7* 106.9* 109.0*  PLT 172 136* 152 150 158 177   Basic Metabolic Panel: Recent Labs  Lab 04/11/23 0402 04/11/23 1700 04/12/23 0758 04/12/23 0804 04/13/23 0540 04/14/23 0300  NA 135 134*  --  135 138 142  K 3.2* 3.8  --  3.8 3.9 3.3*  CL 98 98  --  97* 97* 98  CO2 23 23  --  24 28 32  GLUCOSE 126* 137*  --  125* 140* 142*  BUN 6 6  --  7 6 7   CREATININE 1.47* 1.49*  --  1.25* 1.36* 1.40*  CALCIUM 7.7* 7.6*  --  8.1* 8.0* 8.5*  MG 1.6* 2.1 2.2  --  1.8 2.2  PHOS <1.0* 1.7* 2.0*  --  4.3 3.5    Studies: DG Chest Port 1 View Result Date: 04/14/2023 CLINICAL DATA:  38 year old female with history of acute  hypoxic respiratory failure. EXAM: PORTABLE CHEST 1 VIEW COMPARISON:  Chest x-ray 04/12/2023. FINDINGS: There is a left upper extremity PICC with tip terminating in the superior cavoatrial junction. Lung volumes are very low. Bibasilar opacities which may reflect areas of atelectasis and/or consolidation. Superimposed moderate right and small left pleural effusions. No pneumothorax. Patchy areas of interstitial prominence an ill-defined opacities are also noted elsewhere in the lungs bilaterally, likely reflecting bronchopneumonia. No pneumothorax. No evidence of pulmonary edema. Heart size is normal. Upper mediastinal contours are within normal limits allowing for patient positioning and low lung volumes. IMPRESSION: 1. Support apparatus, as above. 2. Worsening aeration in the lungs concerning for developing multilobar bilateral bronchopneumonia. 3. Increasing moderate right and small left pleural effusions with worsening bibasilar areas of atelectasis and/or consolidation. Electronically Signed   By: Toribio Aye M.D.   On: 04/14/2023 08:16      Scheduled Meds:  carvedilol   6.25 mg Oral BID WC   chlordiazePOXIDE   10 mg Oral TID   Chlorhexidine  Gluconate Cloth  6 each Topical Daily   citalopram   20 mg Oral Daily   feeding supplement  237 mL Oral TID BM   ferrous sulfate   325 mg Oral Daily   fluticasone  furoate-vilanterol  1 puff Inhalation Daily   folic acid   1 mg Oral Daily   furosemide   40 mg Intravenous BID   guaiFENesin   600 mg Oral BID   heparin   5,000 Units Subcutaneous Q8H   [START ON 04/15/2023] lactulose   30 g Oral BID   methocarbamol   500 mg Oral BID   multivitamin with minerals  1 tablet Oral Daily   nicotine   21 mg Transdermal Daily   pantoprazole   40 mg Oral BID AC   sodium chloride  flush  10-40 mL Intracatheter Q12H   sucralfate   1 g Oral TID WC & HS   thiamine   100 mg Oral Daily   Or   thiamine   100 mg Intravenous Daily   Continuous Infusions:  cefTRIAXone  (ROCEPHIN )  IV  2 g (04/14/23 1449)   metronidazole  500 mg (04/14/23 0838)   PRN Meds: acetaminophen , ALPRAZolam , chlorpheniramine-HYDROcodone , hydrALAZINE , ipratropium-albuterol , liver oil-zinc  oxide, morphine  injection, ondansetron  (ZOFRAN ) IV, mouth rinse, oxyCODONE , sodium chloride  flush  Time spent: 55 minutes  Author: Alban Pepper MD [facility] 04/14/2023 4:07 PM  Please contact via epic chat with any questions or concerns.  If 7PM-7AM, please contact night team  If you still have difficulty reaching the attending provider, please page the [facility] (Director on Call) for [facility] on amion for assistance.

## 2023-04-14 NOTE — Plan of Care (Signed)
   Problem: Education: Goal: Knowledge of General Education information will improve Description Including pain rating scale, medication(s)/side effects and non-pharmacologic comfort measures Outcome: Progressing

## 2023-04-14 NOTE — Progress Notes (Signed)
 Time out complete by Dr Karna Christmas for paracentesis. 1.4L fluid removed

## 2023-04-15 DIAGNOSIS — A419 Sepsis, unspecified organism: Secondary | ICD-10-CM

## 2023-04-15 LAB — BASIC METABOLIC PANEL
Anion gap: 15 (ref 5–15)
Anion gap: 12 (ref 5–15)
BUN: 10 mg/dL (ref 6–20)
BUN: 10 mg/dL (ref 6–20)
CO2: 31 mmol/L (ref 22–32)
CO2: 34 mmol/L — ABNORMAL HIGH (ref 22–32)
Calcium: 8.6 mg/dL — ABNORMAL LOW (ref 8.9–10.3)
Calcium: 8.6 mg/dL — ABNORMAL LOW (ref 8.9–10.3)
Chloride: 93 mmol/L — ABNORMAL LOW (ref 98–111)
Chloride: 91 mmol/L — ABNORMAL LOW (ref 98–111)
Creatinine, Ser: 1.29 mg/dL — ABNORMAL HIGH (ref 0.44–1.00)
Creatinine, Ser: 1.32 mg/dL — ABNORMAL HIGH (ref 0.44–1.00)
GFR, Estimated: 55 mL/min — ABNORMAL LOW (ref >60)
GFR, Estimated: 53 mL/min — ABNORMAL LOW (ref >60)
Glucose, Bld: 124 mg/dL — ABNORMAL HIGH (ref 70–99)
Glucose, Bld: 158 mg/dL — ABNORMAL HIGH (ref 70–99)
Potassium: 3.2 mmol/L — ABNORMAL LOW (ref 3.5–5.1)
Potassium: 3.9 mmol/L (ref 3.5–5.1)
Sodium: 139 mmol/L (ref 135–145)
Sodium: 137 mmol/L (ref 135–145)

## 2023-04-15 LAB — HEPATIC FUNCTION PANEL
ALT: 35 U/L (ref 0–44)
AST: 87 U/L — ABNORMAL HIGH (ref 15–41)
Albumin: 2.6 g/dL — ABNORMAL LOW (ref 3.5–5.0)
Alkaline Phosphatase: 242 U/L — ABNORMAL HIGH (ref 38–126)
Bilirubin, Direct: 1 mg/dL — ABNORMAL HIGH (ref 0.0–0.2)
Indirect Bilirubin: 1 mg/dL — ABNORMAL HIGH (ref 0.3–0.9)
Total Bilirubin: 2 mg/dL — ABNORMAL HIGH (ref 0.0–1.2)
Total Protein: 6.3 g/dL — ABNORMAL LOW (ref 6.5–8.1)

## 2023-04-15 LAB — CBC
HCT: 30.9 % — ABNORMAL LOW (ref 36.0–46.0)
Hemoglobin: 9.9 g/dL — ABNORMAL LOW (ref 12.0–15.0)
MCH: 34.3 pg — ABNORMAL HIGH (ref 26.0–34.0)
MCHC: 32 g/dL (ref 30.0–36.0)
MCV: 106.9 fL — ABNORMAL HIGH (ref 80.0–100.0)
Platelets: 176 10*3/uL (ref 150–400)
RBC: 2.89 MIL/uL — ABNORMAL LOW (ref 3.87–5.11)
RDW: 17.5 % — ABNORMAL HIGH (ref 11.5–15.5)
WBC: 11 10*3/uL — ABNORMAL HIGH (ref 4.0–10.5)
nRBC: 0.5 % — ABNORMAL HIGH (ref 0.0–0.2)

## 2023-04-15 LAB — VITAMIN B12: Vitamin B-12: 1389 pg/mL — ABNORMAL HIGH (ref 180–914)

## 2023-04-15 LAB — GLUCOSE, CAPILLARY: Glucose-Capillary: 149 mg/dL — ABNORMAL HIGH (ref 70–99)

## 2023-04-15 LAB — MAGNESIUM: Magnesium: 1.6 mg/dL — ABNORMAL LOW (ref 1.7–2.4)

## 2023-04-15 LAB — FOLATE: Folate: 13.2 ng/mL (ref >5.9)

## 2023-04-15 LAB — PHOSPHORUS: Phosphorus: 1.8 mg/dL — ABNORMAL LOW (ref 2.5–4.6)

## 2023-04-15 MED ORDER — POTASSIUM CHLORIDE CRYS ER 20 MEQ PO TBCR
40.0000 meq | EXTENDED_RELEASE_TABLET | ORAL | Status: AC
  Administered 2023-04-15 (×2): 40 meq via ORAL
  Filled 2023-04-15 (×3): qty 2

## 2023-04-15 MED ORDER — METOPROLOL SUCCINATE ER 25 MG PO TB24
25.0000 mg | ORAL_TABLET | Freq: Every day | ORAL | Status: DC
  Administered 2023-04-15 – 2023-04-19 (×5): 25 mg via ORAL
  Filled 2023-04-15 (×5): qty 1

## 2023-04-15 MED ORDER — MAGNESIUM SULFATE 2 GM/50ML IV SOLN
2.0000 g | Freq: Once | INTRAVENOUS | Status: AC
  Administered 2023-04-15: 2 g via INTRAVENOUS
  Filled 2023-04-15: qty 50

## 2023-04-15 MED ORDER — LACTULOSE 10 GM/15ML PO SOLN: 20.0000 g | Freq: Two times a day (BID) | ORAL | Status: DC

## 2023-04-15 MED ORDER — SPIRONOLACTONE 25 MG PO TABS
50.0000 mg | ORAL_TABLET | Freq: Every day | ORAL | Status: DC
  Administered 2023-04-15 – 2023-04-19 (×5): 50 mg via ORAL
  Filled 2023-04-15 (×5): qty 2

## 2023-04-15 MED ORDER — FUROSEMIDE 40 MG PO TABS
40.0000 mg | ORAL_TABLET | Freq: Every day | ORAL | Status: DC
  Administered 2023-04-16 – 2023-04-18 (×3): 40 mg via ORAL
  Filled 2023-04-15 (×2): qty 2
  Filled 2023-04-15: qty 1

## 2023-04-15 MED ORDER — POTASSIUM PHOSPHATES 15 MMOLE/5ML IV SOLN
30.0000 mmol | Freq: Once | INTRAVENOUS | Status: AC
  Administered 2023-04-15: 30 mmol via INTRAVENOUS
  Filled 2023-04-15: qty 10

## 2023-04-15 MED ORDER — LACTULOSE 10 GM/15ML PO SOLN
10.0000 g | Freq: Every day | ORAL | Status: DC
  Administered 2023-04-16 – 2023-04-19 (×4): 10 g via ORAL
  Filled 2023-04-15 (×4): qty 30

## 2023-04-15 NOTE — Plan of Care (Signed)
  Problem: Clinical Measurements: Goal: Ability to maintain clinical measurements within normal limits will improve Outcome: Progressing Goal: Will remain free from infection Outcome: Progressing Goal: Diagnostic test results will improve Outcome: Progressing Goal: Respiratory complications will improve Outcome: Progressing   Problem: Activity: Goal: Risk for activity intolerance will decrease Outcome: Progressing   Problem: Coping: Goal: Level of anxiety will decrease Outcome: Progressing   Problem: Elimination: Goal: Will not experience complications related to urinary retention Outcome: Progressing   Problem: Skin Integrity: Goal: Risk for impaired skin integrity will decrease Outcome: Progressing

## 2023-04-15 NOTE — Progress Notes (Signed)
 Dr. Montez Morita on the floor. Made aware heart rate staying in 110's. Take metoprolol at home, however currently not ordered here

## 2023-04-15 NOTE — Progress Notes (Signed)
 Light Green tube collected for 1234 BMP suspected of possible contamination per [facility]. Asked by tech to call phlebotomist for Lab recollect to run as DELTA to confirm results. [phone] at 1300.

## 2023-04-15 NOTE — Plan of Care (Signed)
  Problem: Education: Goal: Knowledge of General Education information will improve Description: Including pain rating scale, medication(s)/side effects and non-pharmacologic comfort measures Outcome: Progressing   Problem: Health Behavior/Discharge Planning: Goal: Ability to manage health-related needs will improve Outcome: Progressing   Problem: Clinical Measurements: Goal: Ability to maintain clinical measurements within normal limits will improve Outcome: Progressing Goal: Will remain free from infection Outcome: Progressing Goal: Diagnostic test results will improve Outcome: Progressing Goal: Respiratory complications will improve Outcome: Progressing   Problem: Activity: Goal: Risk for activity intolerance will decrease Outcome: Progressing   Problem: Coping: Goal: Level of anxiety will decrease Outcome: Progressing   Problem: Elimination: Goal: Will not experience complications related to urinary retention Outcome: Progressing   Problem: Pain Management: Goal: General experience of comfort will improve Outcome: Progressing

## 2023-04-15 NOTE — Progress Notes (Signed)
 NAME:  Katie Stanley, MRN:  [id number], DOB:  09/07/1985, LOS: 6 ADMISSION DATE:  04/09/2023, CONSULTATION DATE:  04/12/2023 REFERRING MD:  Dr. Von, CHIEF COMPLAINT:  ETOH withdrawal, Acute Respiratory Distress   Brief Pt Description / Synopsis:  38 y.o. female with past medical history significant for ETOH and polysubstance abuse admitted with Acute Gastroenteritis, Alcoholic Hepatitis, Chronic Liver disease with concern for progression to Cirrhosis, ascites, and hyperammonemia.  Course complicated by developing Alcohol Withdrawal and Acute Hypoxic Respiratory Failure.  History of Present Illness:  Katie Stanley is a 38 y.o. female with PMH of diet-controlled diabetes, chronic liver disease-MAFLD/MASH, self cleared HCV, HAV,   polysubstance abuse, tobacco abuse, alcohol abuse, GERD, depression with anxiety, anemia, obesity, who presented to [facility] ED on 04/09/23 with nausea, vomiting, diarrhea, abdominal pain, abdominal distention.   Pt is currently altered and unable to contribute to history and no family is currently available, therefore history is obtained from chart review.  04/14/23- patient is slightly improved today. She had distended abdomen.  US  abdomen done at bedside with significant ascites. Plan for paracentesis today. Encephalopathy improving.  04/15/23- patient had electrolytes repleted overnight, on HFNC 10L/min with multifocal pneumonia and bilateral small effusions.  Had paracentesis yesterday with 1.4L  clear yellow fluid removed , cell count and diff and gram stain showing no signs of SBP. Mentation is improved now AAOx2. +rectal tube with active BM on lactulose .   Pertinent  Medical History   Past Medical History:  Diagnosis Date   Alcohol abuse    Anxiety    Biliary calculi 12/07/2009   Cholelithiasis    Depression    Diet-controlled diabetes mellitus (HCC)    GERD (gastroesophageal reflux disease)    Hepatitis C    body self healed per patient   LVH (left  ventricular hypertrophy)    a. 10/2022 Echo: EF 60-65%, no rwma, sev conc LVH, nl RV size/fxn, triv MR. Mobile density near ant MV leaflet- felt to be redundant chordae tendinae..   Morbid obesity (HCC)    Palpitations    a. 07/2019 Holter: Sinus rhythm, PVCs, rare PACs.  No significant arrhythmias.   Pancreatic pseudocyst    a. 02/2023 MRI abd: improving unilocular pancreatic pseudocyst.   Retained intrauterine contraceptive device (IUD) 01/20/2020   Substance abuse (HCC)     Micro Data:  12/30: COVID-19/RSV/FLU PCR>>negative 12/30: C. Diff/GI panel PCR>>negative 12/30: Blood culture x2>> no growth to date 12/31: Peritoneal fluid from paracentesis>> no growth to date  Antimicrobials:   Anti-infectives (From admission, onward)    Start     Dose/Rate Route Frequency Ordered Stop   04/10/23 1400  cefTRIAXone  (ROCEPHIN ) 2 g in sodium chloride  0.9 % 100 mL IVPB        2 g 200 mL/hr over 30 Minutes Intravenous Every 24 hours 04/10/23 0845 04/16/23 2359   04/10/23 0900  metroNIDAZOLE  (FLAGYL ) IVPB 500 mg        500 mg 100 mL/hr over 60 Minutes Intravenous Every 12 hours 04/09/23 2113 04/14/23 2217   04/10/23 0600  aztreonam  (AZACTAM ) 2 g in sodium chloride  0.9 % 100 mL IVPB  Status:  Discontinued        2 g 200 mL/hr over 30 Minutes Intravenous Every 8 hours 04/09/23 2132 04/10/23 0845   04/09/23 2030  aztreonam  (AZACTAM ) 2 g in sodium chloride  0.9 % 100 mL IVPB        2 g 200 mL/hr over 30 Minutes Intravenous  Once 04/09/23 2016 04/09/23  2151   04/09/23 2030  metroNIDAZOLE  (FLAGYL ) IVPB 500 mg        500 mg 100 mL/hr over 60 Minutes Intravenous  Once 04/09/23 2016 04/09/23 2352   04/09/23 2030  vancomycin  (VANCOCIN ) IVPB 1000 mg/200 mL premix        1,000 mg 200 mL/hr over 60 Minutes Intravenous  Once 04/09/23 2016 04/10/23 0118       Significant Hospital Events: Including procedures, antibiotic start and stop dates in addition to other pertinent events   12/30: Presented to ED  with N/V/D/Abdominal pain.  Admitted by Hospitalists 12/31: Underwent Paracentesis with 5 L fluid removed.  GI & Palliative Care consulted. 1/1: Late in the evening with hallucinations and anxiety concerning for developing ETOH withdrawal, also with increased WOB 1/2: Continues with increased WOB, PCCM consulted.  Transfer to [facility] given high risk for further decompensation.  Started on Lasix  and BD due to concern for Pulmonary Edema and AECOPD. e  Objective   Blood pressure (!) 141/86, pulse (!) 109, temperature 98.5 F (36.9 C), temperature source Oral, resp. rate (!) 29, height 5' 3 (1.6 m), weight 90 kg, SpO2 94%.        Intake/Output Summary (Last 24 hours) at 04/15/2023 1023 Last data filed at 04/15/2023 0700 Gross per 24 hour  Intake 41.98 ml  Output 2710 ml  Net -2668.02 ml   Filed Weights   04/09/23 1110 04/09/23 1113 04/12/23 1112  Weight: 88.5 kg 88 kg 90 kg    Examination: General: Acute on chronically ill appearing obese female, sitting in bed, with mild respiratory distress HENT: Atraumatic, normocephalic, neck supple, no JVD Lungs: Coarse breath sounds throughout, even, mild tachypnea and abdominal muscle use Cardiovascular:  Tachycardia, regular rhythm, s1s2, no M/G/R Abdomen: Obese, distended Extremities: Normal bulk and tone, no deformities, no edema, no cyanosis Neuro: Lethargic, arouses easily to voice, oriented to person and place, moves all extremities to commands, no focal deficits noted, pupils PERRL GU: Deferred    Assessment & Plan:   #1. Acute Hypoxic Respiratory Failure, suspect due to AECOPD & questionable pulmonary edema #High risk for Aspiration -Supplemental O2 as needed to maintain O2 sats >92% -Follow intermittent Chest X-ray & ABG as needed -Bronchodilators  -IV Steroids have been dcd -ABX as above -Diuresis as BP and renal function permits ~ started on 40 mg Lasix  BID -Pulmonary toilet as able  #Acute Metabolic Encephalopathy-  improved  #Developing Alcohol Withdrawal #Hyperammonemia  PMHx: ETOH abuse, polysubstance abuse -Treatment of metabolic derangements as outlined above -Provide supportive care -Promote normal sleep/wake cycle and family presence -Avoid sedating medications as able -CIWA protocol -Continue Lactulose   #Sepsis in the setting of Acute Gastroenteritis  (PRESENT ON ADMISSION)  vs Chronic Diarrhea (Meets SIRS Criteria on 1/2: HR >90, RR >20) #High risk for aspiration -Monitor fever curve -Trend WBC's & Procalcitonin -Follow cultures as above -Continue empiric Ceftriaxone  and Flagyl   pending cultures & sensitivities  #Alcoholic Hepatitis DF IS <30 #Multifactorial Chronic Liver Disease, with concern for progression to Cirrhosis #Ascites Status post paracentesis on 12/31, fluid and cultures not supportive of SBP -Trend LFT's and coags -GI following, appreciate input  #Acute Kidney Injury- KDIGO 2 -Monitor I&O's / urinary output -Follow BMP -Ensure adequate renal perfusion -Avoid nephrotoxic agents as able -Replace electrolytes as indicated ~ Pharmacy following for assistance with electrolyte replacement      Best Practice (right click and Reselect all SmartList Selections daily)   Diet/type: NPO DVT prophylaxis: prophylactic heparin   GI prophylaxis: PPI  Lines: N/A Foley:  N/A Code Status:  full code Last date of multidisciplinary goals of care discussion [1/2]    Labs   CBC: Recent Labs  Lab 04/09/23 1746 04/10/23 0455 04/11/23 0402 04/12/23 0758 04/13/23 0540 04/14/23 0300 04/15/23 0434  WBC 16.0*   < > 15.2* 8.9 11.7* 11.2* 11.0*  NEUTROABS 13.3*  --   --   --   --   --   --   HGB 10.7*   < > 9.6* 10.2* 9.4* 9.5* 9.9*  HCT 31.5*   < > 28.3* 30.8* 29.3* 30.4* 30.9*  MCV 101.0*   < > 103.7* 102.7* 106.9* 109.0* 106.9*  PLT 172   < > 152 150 158 177 176   < > = values in this interval not displayed.    Basic Metabolic Panel: Recent Labs  Lab 04/11/23 1700  04/12/23 0758 04/12/23 0804 04/13/23 0540 04/14/23 0300 04/15/23 0434  NA 134*  --  135 138 142 139  K 3.8  --  3.8 3.9 3.3* 3.2*  CL 98  --  97* 97* 98 93*  CO2 23  --  24 28 32 31  GLUCOSE 137*  --  125* 140* 142* 124*  BUN 6  --  7 6 7 10   CREATININE 1.49*  --  1.25* 1.36* 1.40* 1.29*  CALCIUM 7.6*  --  8.1* 8.0* 8.5* 8.6*  MG 2.1 2.2  --  1.8 2.2 1.6*  PHOS 1.7* 2.0*  --  4.3 3.5 1.8*   GFR: Estimated Creatinine Clearance: 63.5 mL/min (A) (by C-G formula based on SCr of 1.29 mg/dL (H)). Recent Labs  Lab 04/09/23 1746 04/09/23 1941 04/09/23 2214 04/10/23 0455 04/10/23 0618 04/11/23 0402 04/12/23 0758 04/13/23 0540 04/14/23 0300 04/15/23 0434  PROCALCITON 2.07  --   --   --   --   --   --   --   --   --   WBC 16.0*  --   --    < >  --    < > 8.9 11.7* 11.2* 11.0*  LATICACIDVEN  --  6.2* 5.1*  --  1.8  --   --   --   --   --    < > = values in this interval not displayed.    Liver Function Tests: Recent Labs  Lab 04/12/23 0758 04/12/23 0804 04/13/23 0540 04/14/23 0300 04/15/23 0434  AST 101* 100* 75* 69* 87*  ALT 44 42 38 36 35  ALKPHOS 302* 295* 278* 269* 242*  BILITOT 3.3* 3.5* 2.5* 2.2* 2.0*  PROT 7.3 7.4 6.7 6.6 6.3*  ALBUMIN  3.1* 3.0* 2.8* 2.7* 2.6*   Recent Labs  Lab 04/09/23 1144  LIPASE 33   Recent Labs  Lab 04/10/23 0455 04/11/23 0402 04/12/23 0758 04/13/23 0540 04/14/23 0300  AMMONIA 95* 104* 103* 80* 103*    ABG    Component Value Date/Time   PHART 7.35 04/12/2023 0728   PCO2ART 51 (H) 04/12/2023 0728   PO2ART 69 (L) 04/12/2023 0728   HCO3 28.2 (H) 04/12/2023 0728   O2SAT 94.8 04/12/2023 0728     Coagulation Profile: Recent Labs  Lab 04/09/23 1746  INR 1.3*    Cardiac Enzymes: No results for input(s): CKTOTAL, CKMB, CKMBINDEX, TROPONINI in the last 168 hours.  HbA1C: HB A1C (BAYER DCA - WAIVED)  Date/Time Value Ref Range Status  02/05/2023 03:03 PM 6.1 (H) 4.8 - 5.6 % Final    Comment:  Prediabetes: 5.7 - 6.4          Diabetes: >6.4          Glycemic control for adults with diabetes: <7.0   10/01/2018 04:30 PM 5.3 <7.0 % Final    Comment:                                          Diabetic Adult            <7.0                                       Healthy Adult        4.3 - 5.7                                                           (DCCT/NGSP) American Diabetes Association's Summary of Glycemic Recommendations for Adults with Diabetes: Hemoglobin A1c <7.0%. More stringent glycemic goals (A1c <6.0%) may further reduce complications at the cost of increased risk of hypoglycemia.    Hgb A1c MFr Bld  Date/Time Value Ref Range Status  10/27/2022 05:04 PM 6.6 (H) 4.8 - 5.6 % Final    Comment:    (NOTE)         Prediabetes: 5.7 - 6.4         Diabetes: >6.4         Glycemic control for adults with diabetes: <7.0   12/10/2019 02:17 PM 5.8 (H) 4.8 - 5.6 % Final    Comment:    (NOTE) Pre diabetes:          5.7%-6.4%  Diabetes:              >6.4%  Glycemic control for   <7.0% adults with diabetes     CBG: Recent Labs  Lab 04/12/23 0805 04/12/23 1118 04/13/23 0744 04/14/23 0806 04/15/23 0747  GLUCAP 128* 129* 137* 142* 149*    Review of Systems:   Unable to assess due to AMS  Past Medical History:  She,  has a past medical history of Alcohol abuse, Anxiety, Biliary calculi (12/07/2009), Cholelithiasis, Depression, Diet-controlled diabetes mellitus (HCC), GERD (gastroesophageal reflux disease), Hepatitis C, LVH (left ventricular hypertrophy), Morbid obesity (HCC), Palpitations, Pancreatic pseudocyst, Retained intrauterine contraceptive device (IUD) (01/20/2020), and Substance abuse (HCC).   Surgical History:   Past Surgical History:  Procedure Laterality Date   CHOLECYSTECTOMY     2011   COLONOSCOPY WITH PROPOFOL  N/A 09/20/2022   Procedure: COLONOSCOPY WITH PROPOFOL ;  Surgeon: Jinny Carmine, MD;  Location: [facility] ENDOSCOPY;  Service: Endoscopy;   Laterality: N/A;   ESOPHAGOGASTRODUODENOSCOPY N/A 09/29/2021   Procedure: ESOPHAGOGASTRODUODENOSCOPY (EGD);  Surgeon: Jinny Carmine, MD;  Location: [facility] ENDOSCOPY;  Service: Endoscopy;  Laterality: N/A;   ESOPHAGOGASTRODUODENOSCOPY (EGD) WITH PROPOFOL  N/A 01/21/2018   Procedure: ESOPHAGOGASTRODUODENOSCOPY (EGD) WITH Biopsy;  Surgeon: Jinny Carmine, MD;  Location: [facility];  Service: Endoscopy;  Laterality: N/A;   HYSTEROSCOPY WITH D & C N/A 01/20/2020   Procedure: DILATATION AND CURETTAGE /HYSTEROSCOPY;  Surgeon: Leonce Garnette BIRCH, MD;  Location: [facility] ORS;  Service: Gynecology;  Laterality: N/A;   INTRAUTERINE DEVICE (  IUD) INSERTION N/A 01/20/2020   Procedure: INTRAUTERINE DEVICE (IUD) INSERTION MIRENA  IUD;  Surgeon: Leonce Garnette BIRCH, MD;  Location: [facility] ORS;  Service: Gynecology;  Laterality: N/A;   IUD REMOVAL N/A 01/20/2020   Procedure: INTRAUTERINE DEVICE (IUD) REMOVAL;  Surgeon: Leonce Garnette BIRCH, MD;  Location: [facility] ORS;  Service: Gynecology;  Laterality: N/A;   PORT-A-CATH REMOVAL  02/08/2022   PORTA CATH INSERTION N/A 01/01/2019   Procedure: PORTA CATH INSERTION;  Surgeon: Marea Selinda RAMAN, MD;  Location: [facility] INVASIVE CV LAB;  Service: Cardiovascular;  Laterality: N/A;   VASCULAR SURGERY       Social History:   reports that she has been smoking cigarettes. She has a 12 pack-year smoking history. She has never used smokeless tobacco. She reports current alcohol use. She reports current drug use. Drug: Marijuana.   Family History:  Her family history includes Breast cancer in her paternal grandmother; Diabetes in her father; Heart disease in her paternal grandfather; Hypertension in her father, maternal grandfather, and mother; Liver cancer in her maternal grandmother.   Allergies Allergies  Allergen Reactions   Penicillins Swelling    Tolerated ceftriaxone  and cefepime  in past per [facility] and [facility] records     Home Medications  Prior to Admission medications   Medication  Sig Start Date End Date Taking? Authorizing Provider  citalopram  (CELEXA ) 20 MG tablet TAKE 1.5 TABLET(20 MG) BY MOUTH DAILY 02/20/23  Yes Johnson, Megan P, DO  famotidine  (PEPCID ) 20 MG tablet Take 20 mg by mouth 2 (two) times daily.   Yes [provider]  folic acid  (FOLVITE ) 1 MG tablet Take 1 tablet (1 mg total) by mouth daily. 02/13/23  Yes Regalado, Belkys A, MD  Iron , Ferrous Sulfate , 325 (65 Fe) MG TABS Take 325 mg by mouth daily. 03/07/23  Yes Johnson, Megan P, DO  lidocaine  (LIDODERM ) 5 % Place 1 patch onto the skin daily. Remove & Discard patch within 12 hours or as directed by MD 04/28/22  Yes Vicci, Megan P, DO  MAGNESIUM  PO Take 1 each by mouth in the morning and at bedtime. OTC patient is unsure of the dost   Yes [provider]  methocarbamol  (ROBAXIN ) 500 MG tablet Take 1 tablet (500 mg total) by mouth 2 (two) times daily. 10/20/22  Yes Brimage, Vondra, DO  metoprolol  succinate (TOPROL -XL) 25 MG 24 hr tablet Take 1 tablet (25 mg total) by mouth daily. 02/20/23  Yes Johnson, Megan P, DO  Multiple Vitamin (MULTIVITAMIN WITH MINERALS) TABS tablet Take 1 tablet by mouth daily. 02/13/23  Yes Regalado, Belkys A, MD  pantoprazole  (PROTONIX ) 40 MG tablet Take 1 tablet (40 mg total) by mouth 2 (two) times daily before a meal. 02/20/23 04/09/23 Yes Johnson, Megan P, DO  potassium chloride  SA (KLOR-CON  M) 20 MEQ tablet TAKE 1 TABLET(20 MEQ) BY MOUTH DAILY 03/20/23  Yes Johnson, Megan P, DO  sucralfate  (CARAFATE ) 1 GM/10ML suspension Take 10 mLs (1 g total) by mouth 4 (four) times daily -  with meals and at bedtime. 08/17/22  Yes Johnson, Megan P, DO  thiamine  (VITAMIN B-1) 100 MG tablet Take 1 tablet (100 mg total) by mouth daily. 02/20/23  Yes Johnson, Megan P, DO  doxycycline  (VIBRA -TABS) 100 MG tablet Take 1 tablet (100 mg total) by mouth 2 (two) times daily. Patient not taking: Reported on 04/09/2023 02/26/23   Vicci Duwaine SQUIBB, DO       Halina Picking, M.D.  Pulmonary &  Critical Care Medicine

## 2023-04-15 NOTE — Progress Notes (Signed)
 [facility] Progress Note  Patient: Katie Stanley    FMW:[id number]  DOA: 04/09/2023     Date of Service: the patient was seen and examined on 04/15/2023  Chief Complaint  Patient presents with   Chest Pain   Brief hospital course: Katie Stanley is a 38 y.o. female with PMH of diet-controlled diabetes, chronic liver disease-MAFLD/MASH, self cleared HCV, HAV,   polysubstance abuse, tobacco abuse, alcohol abuse, GERD, depression with anxiety, anemia, obesity, who presents with nausea, vomiting, diarrhea, abdominal pain, abdominal distention.   Patient was recently hospitalized from 11/1 - 11/4 due to multiple electrolytes disturbance and diarrhea.  Patient had positive C. difficile antigen, but negative toxigenic C. difficile PCR.  Vancomycin  was not continued at discharge after discussing with GI.   Patient states that she has nausea, vomiting, diarrhea and abdominal pain for more than 1 week this time.  She has more than 10 times of nonbilious nonbloody vomiting and more than 10 times of watery diarrhea each day.  Her abdominal pain is diffuse, constant, cramping, nonradiating, not alleviated or aggravated by any known factors.  She states that her abdomen has been gradually distended recently. No fever or chills.  Patient has mild dry cough and mild shortness or breath.  She also reports chest discomfort earlier, which has really resolved.  Currently no active chest pain.  Denies symptoms of UTI.     Data reviewed independently and ED Course: pt was found to have WBC 6.0, magnesium  1.1, potassium 2.8, AKI with creatinine 1.56, BUN <5.0, and GFR 44 (recent creatinine 1.17 on 03/06/2023, and 0.64 on 06/24/2022), troponin level 17, lactic acid 6.2, INR 1.3, negative PCR for COVID, flu and RSV, abnormal liver function (ALP 386, AST 119, ALT 38, total bilirubin 6.3, direct bilirubin 0.5), lipase 33.  Chest x-ray negative.  Patient is admitted to telemetry bed as inpatient.  Dr. Desiderio of  general surgery is consulted.   CT abdomen/pelvis: 1. Extensive inflammatory process involving small and large bowel. This could represent an infectious or inflammatory process versus ischemia. 2. Large amount of ascites. 3. Fatty liver. 4. Pancreatic lesion, smaller than on the prior study, presumably a pseudocyst. 5.  Hepatobiliary: Marked hepatic fatty infiltration. No focal lesions identified. No biliary ductal dilatation.   1/2 PCCM consulted, patient was transferred to stepdown unit for close monitoring on  1/3 still patient has respiratory distress requiring 8L via HFNC 1/4 1.4L paracentesis    Assessment and Plan:  # Acute gastroenteritis vs chronic diarrhea-Resolving  Patient presented with N/V/D and abdominal pain with distention CT scan shows inflammation of small and large intestine infectious versus inflammatory. Possibly related to ascites. WBC and procalcitonin elevated Negative C. difficile and GI pathogen panel S/p IV fluids Discontinued IV fluid due to significant ascites, vital signs remained stable. S/p vancomycin  given in the ED, discontinued aztreonam  S/p 5d course of Flagyl , and ceftriaxone  d6/7  for possible SBP Patient has had 1-3 bowel movements since being in the hospital. Continues to have large amounts of stool -Decrease lactulose  to once daily dosing.    # Ascites, possible SBP Chronic liver disease may have progressed to liver cirrhosis Elevated WBC count Procalcitonin 2.0 elevated Continue Flagyl  for 5 days, and ceftriaxone  for 7 days empirically for possible SBP since fluid studies obtained after initiaiton of antibiotics and elevated WBC count and procal on admission  S/p paracentesis 5 L fluid was tapped by IR on 12/31 S/p albumin  25 g IV one-time dose on 12/31 Ascitic  Fluid culture No growth, blood culture NGTD S/p paracentesis 1.4L fluid removed by PCCM 1/4 S/p albumin  12.5g x1  Fluid Gram stain negative for organisms, culture no growth  less than 24 hours GI following On 1/2 started Lasix  40 mg IV twice daily -Start lasix  40mg  PO, spironolactone  50mg  every day now that patient more awake and blood pressure will allow , d/c IV lasix   Consider repeating abdominal ultrasound and paracentesis on Monday   # Acute hypoxic respiratory failure most likely due to pulmonary edema and COPD exacerbation Remains on HFNC.  On 1/2 started Solu-Medrol  40 mg IV twice daily for 2 days followed by 40 mg IV daily for 3 days, steroids were discontinued 1/4 Continue breo ellipta  and as needed duonebs Mucinex  extremity gram p.o. twice daily, Tussionex as needed for cough Continue diuresis per above  Continue supplemental O2, wean as able    # Alcoholic hepatitis # Hyperbilirubinemia MDF 22 CT shows inflammation with fatty infiltration of liver No need for prednisone  per GI    Latest Ref Rng & Units 04/15/2023    4:34 AM 04/14/2023    3:00 AM 04/13/2023    5:40 AM  Hepatic Function  Total Protein 6.5 - 8.1 g/dL 6.3  6.6  6.7   Albumin  3.5 - 5.0 g/dL 2.6  2.7  2.8   AST 15 - 41 U/L 87  69  75   ALT 0 - 44 U/L 35  36  38   Alk Phosphatase 38 - 126 U/L 242  269  278   Total Bilirubin 0.0 - 1.2 mg/dL 2.0  2.2  2.5   Bilirubin, Direct 0.0 - 0.2 mg/dL 1.0  1.1  1.3   Will need formal cirrhosis eval outpatient    # 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 On lactulose , titrate to 2-3 BM per day    # AKI On 11/01/22 Cr 0.83, gradually getting worse Cr 1.75 on admission>1.25>1.36>1.4 Will transition patient to PO diuretic regimen  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 Avoid nephrotoxic medication, use renally dose medications Monitor renal function and urine output Daily      Latest Ref Rng & Units 04/15/2023    1:08 PM 04/15/2023    4:34 AM 04/14/2023    3:00 AM  BMP  Glucose 70 - 99 mg/dL 841  875  857   BUN 6 - 20 mg/dL 10  10  7    Creatinine 0.44 - 1.00 mg/dL 8.67  8.70  8.59   Sodium 135 - 145 mmol/L 137  139  142   Potassium 3.5 - 5.1 mmol/L  3.9  3.2  3.3   Chloride 98 - 111 mmol/L 91  93  98   CO2 22 - 32 mmol/L 34  31  32   Calcium 8.9 - 10.3 mg/dL 8.6  8.6  8.5      # Lactic acidosis most likely due to intravascular volume depletion/poor excretion of lactate  Lactic acid 5.1, improved repeat lactic acid 1.8 s/p IVF  # EtOH use disorder, Has liver dysfunction due to her alcohol use.  abstinence counseling done  Last drink was 3 days before admission Continue CIWA protocol and watch for withdrawal symptoms Continue multivitamin, folate and thiamine  S/p Librium  taper  Was briefly on naltrexone  in the past. Is open to starting acamprosate when more stable.   # History of polysubstance use, UDS positive for THC and opiates Drug abuse abstinence counseling done TOC consulted   # GERD: Continue PPI and Carafate  -D/c carafate    #  Depression, continuedCelexa   # Macrocytic anemia and thrombocytopenia due to EtOH use Monitor H&H  # Isotonic hyponatremia, most likely nutritional deficiency. Resolved  serum osmolality 285 wnl # Hypokalemia, potassium repleted. # Hypophosphatemia, Phos repleted. # Hypomagnesemia, mag repleted. Monitor electrolytes and replete as needed.     Body mass index is 35.15 kg/m.  Interventions:  Diet: Regular diet DVT Prophylaxis: Subcutaneous Heparin     Advance goals of care discussion: Full code  Family Communication: family was not present at bedside, at the time of interview.    Disposition:  Pt is from Home, admitted with ascites, abdominal distention, still has Resp failure and electrolyte imbalance, which precludes a safe discharge. Discharge to home, when stable, may need few days to improve.  Subjective: Patient is more awake on exam. Has some abdominal discomfort, feels like fluid is starting to re-accumulate otherwise no issues.     Physical Exam: Physical Exam  Constitutional: In no distress.  Cardiovascular: Normal rate, regular rhythm. No lower extremity edema   Pulmonary: Non labored breathing on room air, no wheezing or rales.  Abdominal: Soft. Normal bowel sounds. mildly distended and non tender Musculoskeletal: Normal range of motion.     Neurological: Alert and oriented to person, place, and time. Non focal  Skin: Skin is warm and dry.    Vitals:   04/15/23 1230 04/15/23 1345 04/15/23 1355 04/15/23 1400  BP:  (!) 145/92  (!) 146/92  Pulse: 97 (!) 102 99 (!) 101  Resp: 19 (!) 37 (!) 21 19  Temp:      TempSrc:      SpO2: 99% 99% 97% 95%  Weight:      Height:        Intake/Output Summary (Last 24 hours) at 04/15/2023 1459 Last data filed at 04/15/2023 1351 Gross per 24 hour  Intake 240 ml  Output 2255 ml  Net -2015 ml    Filed Weights   04/09/23 1110 04/09/23 1113 04/12/23 1112  Weight: 88.5 kg 88 kg 90 kg    Data Reviewed: I have personally reviewed and interpreted daily labs, tele strips, imagings as discussed above. I reviewed all nursing notes, pharmacy notes, vitals, pertinent old records  CBC: Recent Labs  Lab 04/09/23 1746 04/10/23 0455 04/11/23 0402 04/12/23 0758 04/13/23 0540 04/14/23 0300 04/15/23 0434  WBC 16.0*   < > 15.2* 8.9 11.7* 11.2* 11.0*  NEUTROABS 13.3*  --   --   --   --   --   --   HGB 10.7*   < > 9.6* 10.2* 9.4* 9.5* 9.9*  HCT 31.5*   < > 28.3* 30.8* 29.3* 30.4* 30.9*  MCV 101.0*   < > 103.7* 102.7* 106.9* 109.0* 106.9*  PLT 172   < > 152 150 158 177 176   < > = values in this interval not displayed.   Basic Metabolic Panel: Recent Labs  Lab 04/11/23 1700 04/12/23 0758 04/12/23 0804 04/13/23 0540 04/14/23 0300 04/15/23 0434 04/15/23 1308  NA 134*  --  135 138 142 139 137  K 3.8  --  3.8 3.9 3.3* 3.2* 3.9  CL 98  --  97* 97* 98 93* 91*  CO2 23  --  24 28 32 31 34*  GLUCOSE 137*  --  125* 140* 142* 124* 158*  BUN 6  --  7 6 7 10 10   CREATININE 1.49*  --  1.25* 1.36* 1.40* 1.29* 1.32*  CALCIUM 7.6*  --  8.1* 8.0* 8.5* 8.6* 8.6*  MG 2.1 2.2  --  1.8 2.2 1.6*  --   PHOS 1.7* 2.0*  --   4.3 3.5 1.8*  --     Studies: No results found.     Scheduled Meds:  Chlorhexidine  Gluconate Cloth  6 each Topical Daily   citalopram   20 mg Oral Daily   feeding supplement  237 mL Oral TID BM   ferrous sulfate   325 mg Oral Daily   fluticasone  furoate-vilanterol  1 puff Inhalation Daily   folic acid   1 mg Oral Daily   furosemide   40 mg Intravenous BID   guaiFENesin   600 mg Oral BID   heparin   5,000 Units Subcutaneous Q8H   lactulose   10 g Oral Daily   methocarbamol   500 mg Oral BID   metoprolol  succinate  25 mg Oral Daily   multivitamin with minerals  1 tablet Oral Daily   nicotine   21 mg Transdermal Daily   pantoprazole   40 mg Oral BID AC   sodium chloride  flush  10-40 mL Intracatheter Q12H   sucralfate   1 g Oral TID WC & HS   thiamine   100 mg Oral Daily   Or   thiamine   100 mg Intravenous Daily   Continuous Infusions:  cefTRIAXone  (ROCEPHIN )  IV 2 g (04/15/23 1345)   potassium PHOSPHATE  IVPB (in mmol) 30 mmol (04/15/23 0950)   PRN Meds: acetaminophen , ALPRAZolam , chlorpheniramine-HYDROcodone , hydrALAZINE , ipratropium-albuterol , liver oil-zinc  oxide, morphine  injection, ondansetron  (ZOFRAN ) IV, mouth rinse, oxyCODONE , sodium chloride  flush  Time spent: 35 minutes  Author: Alban Pepper MD [facility] 04/15/2023 2:59 PM  Please contact via epic chat with any questions or concerns.  If 7PM-7AM, please contact night team  If you still have difficulty reaching the attending provider, please page the [facility] (Director on Call) for [facility] on amion for assistance.

## 2023-04-15 NOTE — Progress Notes (Signed)
 Dr. Montez Morita was made aware am potassium level is 3.2 and phosphorus is 1.8. per md will place orders as needed. Per MD will hold off on rechecking ammonia level this am.

## 2023-04-15 NOTE — Progress Notes (Addendum)
 Patient is a&o, requested for rectal tube be removed.

## 2023-04-16 DIAGNOSIS — R112 Nausea with vomiting, unspecified: Secondary | ICD-10-CM | POA: Diagnosis not present

## 2023-04-16 DIAGNOSIS — R197 Diarrhea, unspecified: Secondary | ICD-10-CM | POA: Diagnosis not present

## 2023-04-16 LAB — CBC WITH DIFFERENTIAL/PLATELET
Abs Immature Granulocytes: 0.38 10*3/uL — ABNORMAL HIGH (ref 0.00–0.07)
Basophils Absolute: 0.1 10*3/uL (ref 0.0–0.1)
Basophils Relative: 1 %
Eosinophils Absolute: 0.1 10*3/uL (ref 0.0–0.5)
Eosinophils Relative: 1 %
HCT: 31.1 % — ABNORMAL LOW (ref 36.0–46.0)
Hemoglobin: 10.4 g/dL — ABNORMAL LOW (ref 12.0–15.0)
Immature Granulocytes: 3 %
Lymphocytes Relative: 15 %
Lymphs Abs: 2 10*3/uL (ref 0.7–4.0)
MCH: 33.9 pg (ref 26.0–34.0)
MCHC: 33.4 g/dL (ref 30.0–36.0)
MCV: 101.3 fL — ABNORMAL HIGH (ref 80.0–100.0)
Monocytes Absolute: 1.7 10*3/uL — ABNORMAL HIGH (ref 0.1–1.0)
Monocytes Relative: 13 %
Neutro Abs: 9.1 10*3/uL — ABNORMAL HIGH (ref 1.7–7.7)
Neutrophils Relative %: 67 %
Platelets: 193 10*3/uL (ref 150–400)
RBC: 3.07 MIL/uL — ABNORMAL LOW (ref 3.87–5.11)
RDW: 18 % — ABNORMAL HIGH (ref 11.5–15.5)
WBC: 13.2 10*3/uL — ABNORMAL HIGH (ref 4.0–10.5)
nRBC: 0.4 % — ABNORMAL HIGH (ref 0.0–0.2)

## 2023-04-16 LAB — GLUCOSE, CAPILLARY
Glucose-Capillary: 90 mg/dL (ref 70–99)
Glucose-Capillary: 78 mg/dL (ref 70–99)

## 2023-04-16 LAB — COMPREHENSIVE METABOLIC PANEL
ALT: 36 U/L (ref 0–44)
AST: 81 U/L — ABNORMAL HIGH (ref 15–41)
Albumin: 2.6 g/dL — ABNORMAL LOW (ref 3.5–5.0)
Alkaline Phosphatase: 251 U/L — ABNORMAL HIGH (ref 38–126)
Anion gap: 13 (ref 5–15)
BUN: 13 mg/dL (ref 6–20)
CO2: 35 mmol/L — ABNORMAL HIGH (ref 22–32)
Calcium: 9 mg/dL (ref 8.9–10.3)
Chloride: 90 mmol/L — ABNORMAL LOW (ref 98–111)
Creatinine, Ser: 1.14 mg/dL — ABNORMAL HIGH (ref 0.44–1.00)
GFR, Estimated: >60 mL/min (ref >60)
Glucose, Bld: 94 mg/dL (ref 70–99)
Potassium: 4.4 mmol/L (ref 3.5–5.1)
Sodium: 138 mmol/L (ref 135–145)
Total Bilirubin: 2.1 mg/dL — ABNORMAL HIGH (ref 0.0–1.2)
Total Protein: 6.2 g/dL — ABNORMAL LOW (ref 6.5–8.1)

## 2023-04-16 LAB — PHOSPHORUS: Phosphorus: 2.3 mg/dL — ABNORMAL LOW (ref 2.5–4.6)

## 2023-04-16 LAB — MISC LABCORP TEST (SEND OUT): Labcorp test code: 9985

## 2023-04-16 LAB — PATHOLOGIST SMEAR REVIEW

## 2023-04-16 LAB — MAGNESIUM: Magnesium: 1.7 mg/dL (ref 1.7–2.4)

## 2023-04-16 LAB — AMMONIA: Ammonia: 63 umol/L — ABNORMAL HIGH (ref 9–35)

## 2023-04-16 MED ORDER — OXYCODONE HCL 5 MG PO TABS
5.0000 mg | ORAL_TABLET | Freq: Four times a day (QID) | ORAL | Status: DC | PRN
  Administered 2023-04-16 – 2023-04-19 (×10): 5 mg via ORAL
  Filled 2023-04-16 (×10): qty 1

## 2023-04-16 NOTE — Plan of Care (Signed)
  Problem: Education: Goal: Knowledge of General Education information will improve Description: Including pain rating scale, medication(s)/side effects and non-pharmacologic comfort measures Outcome: Progressing   Problem: Clinical Measurements: Goal: Cardiovascular complication will be avoided Outcome: Progressing   Problem: Activity: Goal: Risk for activity intolerance will decrease Outcome: Progressing   Problem: Elimination: Goal: Will not experience complications related to bowel motility Outcome: Progressing Goal: Will not experience complications related to urinary retention Outcome: Progressing   Problem: Safety: Goal: Ability to remain free from injury will improve Outcome: Progressing   Problem: Skin Integrity: Goal: Risk for impaired skin integrity will decrease Outcome: Progressing   Problem: Nutrition: Goal: Adequate nutrition will be maintained Outcome: Not Progressing   Problem: Pain Management: Goal: General experience of comfort will improve Outcome: Not Progressing

## 2023-04-16 NOTE — Progress Notes (Signed)
 Progress Note   Patient: Katie Stanley FMW:[id number] DOB: October 16, 1985 DOA: 04/09/2023     7 DOS: the patient was seen and examined on 04/16/2023   Brief hospital course:  Katie Stanley is a 38 y.o. female with PMH of diet-controlled diabetes, chronic liver disease-MAFLD/MASH, self cleared HCV, HAV,   polysubstance abuse, tobacco abuse, alcohol abuse, GERD, depression with anxiety, anemia, obesity, who presents with nausea, vomiting, diarrhea, abdominal pain, abdominal distention.    Patient was recently hospitalized from 11/1 - 11/4 due to multiple electrolytes disturbance and diarrhea.  Patient had positive C. difficile antigen, but negative toxigenic C. difficile PCR.  Vancomycin  was not continued at discharge after discussing with GI.   Patient states that she has nausea, vomiting, diarrhea and abdominal pain for more than 1 week this time.  She has more than 10 times of nonbilious nonbloody vomiting and more than 10 times of watery diarrhea each day.  Her abdominal pain is diffuse, constant, cramping, nonradiating, not alleviated or aggravated by any known factors.  She states that her abdomen has been gradually distended recently. No fever or chills.  Patient has mild dry cough and mild shortness or breath.  She also reports chest discomfort earlier, which has really resolved.  Currently no active chest pain.  Denies symptoms of UTI.     Data reviewed independently and ED Course: pt was found to have WBC 6.0, magnesium  1.1, potassium 2.8, AKI with creatinine 1.56, BUN <5.0, and GFR 44 (recent creatinine 1.17 on 03/06/2023, and 0.64 on 06/24/2022), troponin level 17, lactic acid 6.2, INR 1.3, negative PCR for COVID, flu and RSV, abnormal liver function (ALP 386, AST 119, ALT 38, total bilirubin 6.3, direct bilirubin 0.5), lipase 33.  Chest x-ray negative.  Patient is admitted to telemetry bed as inpatient.  Dr. Desiderio of general surgery is consulted.   CT abdomen/pelvis: 1. Extensive  inflammatory process involving small and large bowel. This could represent an infectious or inflammatory process versus ischemia. 2. Large amount of ascites. 3. Fatty liver. 4. Pancreatic lesion, smaller than on the prior study, presumably a pseudocyst. 5.  Hepatobiliary: Marked hepatic fatty infiltration. No focal lesions identified. No biliary ductal dilatation.    1/2 PCCM consulted, patient was transferred to stepdown unit for close monitoring on  1/3 still patient has respiratory distress requiring 8L via HFNC 1/4 1.4L paracentesis      Assessment and Plan:   # Acute gastroenteritis vs chronic diarrhea-Resolving  Patient presented with N/V/D and abdominal pain with distention CT scan shows inflammation of small and large intestine infectious versus inflammatory. Possibly related to ascites. WBC and procalcitonin elevated Negative C. difficile and GI pathogen panel S/p IV fluids Discontinued IV fluid due to significant ascites, vital signs remained stable. S/p vancomycin  given in the ED, discontinued aztreonam  S/p 5d course of Flagyl , and ceftriaxone  d6/7  for possible SBP Patient has had 1-3 bowel movements since being in the hospital. Continues to have large amounts of stool -Decrease lactulose  to once daily dosing.      # Ascites, possible SBP Chronic liver disease may have progressed to liver cirrhosis Elevated WBC count Procalcitonin 2.0 elevated Continue current antibiotics S/p paracentesis 5 L fluid was tapped by IR on 12/31 S/p albumin  25 g IV one-time dose on 12/31 Ascitic Fluid culture No growth, blood culture NGTD S/p paracentesis 1.4L fluid removed by PCCM 1/4 S/p albumin  12.5g x1  Fluid Gram stain negative for organisms, culture no growth less than 24 hours GI following  On 1/2 started Lasix  40 mg IV twice daily Continue lasix  40mg  PO, spironolactone  50mg  every day now that patient more awake and blood pressure will allow   # Acute hypoxic respiratory  failure most likely due to pulmonary edema and COPD exacerbation Titrate down oxygen as tolerated On 1/2 started Solu-Medrol  40 mg IV twice daily for 2 days followed by 40 mg IV daily for 3 days, steroids were discontinued 1/4 Continue breo ellipta  and as needed duonebs Continue diuresis per above  Continue current oxygen requirement     # Alcoholic hepatitis # Hyperbilirubinemia MDF 22 CT shows inflammation with fatty infiltration of liver  Continue to monitor liver function  # 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 Continue on lactulose , titrate to 2-3 BM per day Follow-up on repeat ammonia level     # AKI On 11/01/22 Cr 0.83, gradually getting worse Cr 1.75 on admission>1.25>1.36>1.4 Will transition patient to PO diuretic regimen  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  abstinence counseling done  Last drink was 3 days before admission Continue CIWA protocol and watch for withdrawal symptoms Continue multivitamin, folate and thiamine  S/p Librium  taper  Was briefly on naltrexone  in the past. Is open to starting acamprosate when more stable.    # History of polysubstance use, UDS positive for THC and opiates Drug abuse abstinence counseling done TOC consulted    # GERD: Continue PPI and Carafate  -D/c carafate     # Depression, continuedCelexa     # Macrocytic anemia and thrombocytopenia due to EtOH use Monitor H&H   # Isotonic hyponatremia, most likely nutritional deficiency. Resolved  serum osmolality 285 wnl # Hypokalemia, potassium repleted. # Hypophosphatemia, Phos repleted. # Hypomagnesemia, mag repleted. Monitor electrolytes and replete as  needed.   Body mass index is 35.15 kg/m.  Interventions:   Diet: Regular diet DVT Prophylaxis: Subcutaneous Heparin      Advance goals of care discussion: Full code   Family Communication: family was not present at bedside, at the time of interview.   Subjective:  Patient seen and examined at bedside this morning Patient denied worsening abdominal pain chest pain cough or shortness of breath  Physical Exam: Constitutional: In no acute distress Cardiovascular: Normal rate, regular rhythm. No lower extremity edema  Pulmonary: Non labored breathing on room air, no wheezing or rales.  Abdominal: Soft. Normal bowel sounds. mildly distended and non tender Musculoskeletal: Normal range of motion.     Neurological: Alert and oriented to person, place, and time. Non focal  Skin: Skin is warm and dry.      Vitals:   04/16/23 0812 04/16/23 0900 04/16/23 1000 04/16/23 1700  BP:  (!) 146/100 (!) 129/91 124/70  Pulse:  95 92 88  Resp:  17 20 19   Temp: 98.5 F (36.9 C)     TempSrc: Oral     SpO2:  91% 90% 93%  Weight:      Height:        Data Reviewed:    Latest Ref Rng & Units 04/16/2023    4:11 AM 04/15/2023    1:08 PM 04/15/2023    4:34 AM  BMP  Glucose 70 - 99 mg/dL 94  841  124   BUN 6 - 20 mg/dL 13  10  10    Creatinine 0.44 - 1.00 mg/dL 8.85  8.67  8.70   Sodium 135 - 145 mmol/L 138  137  139   Potassium 3.5 - 5.1 mmol/L 4.4  3.9  3.2   Chloride 98 - 111 mmol/L 90  91  93   CO2 22 - 32 mmol/L 35  34  31   Calcium 8.9 - 10.3 mg/dL 9.0  8.6  8.6        Latest Ref Rng & Units 04/16/2023    4:11 AM 04/15/2023    4:34 AM 04/14/2023    3:00 AM  CBC  WBC 4.0 - 10.5 K/uL 13.2  11.0  11.2   Hemoglobin 12.0 - 15.0 g/dL 89.5  9.9  9.5   Hematocrit 36.0 - 46.0 % 31.1  30.9  30.4   Platelets 150 - 400 K/uL 193  176  177       Author: Drue ONEIDA Potter, MD 04/16/2023 6:39 PM  For on call review [email or web address].

## 2023-04-17 ENCOUNTER — Other Ambulatory Visit: Payer: Medicaid Other

## 2023-04-17 DIAGNOSIS — Z7189 Other specified counseling: Secondary | ICD-10-CM | POA: Diagnosis not present

## 2023-04-17 DIAGNOSIS — R197 Diarrhea, unspecified: Secondary | ICD-10-CM | POA: Diagnosis not present

## 2023-04-17 DIAGNOSIS — R112 Nausea with vomiting, unspecified: Secondary | ICD-10-CM | POA: Diagnosis not present

## 2023-04-17 LAB — CBC WITH DIFFERENTIAL/PLATELET
Abs Immature Granulocytes: 0.6 10*3/uL — ABNORMAL HIGH (ref 0.00–0.07)
Basophils Absolute: 0.1 10*3/uL (ref 0.0–0.1)
Basophils Relative: 1 %
Eosinophils Absolute: 0.1 10*3/uL (ref 0.0–0.5)
Eosinophils Relative: 1 %
HCT: 33.6 % — ABNORMAL LOW (ref 36.0–46.0)
Hemoglobin: 11.2 g/dL — ABNORMAL LOW (ref 12.0–15.0)
Immature Granulocytes: 3 %
Lymphocytes Relative: 14 %
Lymphs Abs: 2.7 10*3/uL (ref 0.7–4.0)
MCH: 33.7 pg (ref 26.0–34.0)
MCHC: 33.3 g/dL (ref 30.0–36.0)
MCV: 101.2 fL — ABNORMAL HIGH (ref 80.0–100.0)
Monocytes Absolute: 2 10*3/uL — ABNORMAL HIGH (ref 0.1–1.0)
Monocytes Relative: 11 %
Neutro Abs: 13.3 10*3/uL — ABNORMAL HIGH (ref 1.7–7.7)
Neutrophils Relative %: 70 %
Platelets: 217 10*3/uL (ref 150–400)
RBC: 3.32 MIL/uL — ABNORMAL LOW (ref 3.87–5.11)
RDW: 18.2 % — ABNORMAL HIGH (ref 11.5–15.5)
WBC: 18.7 10*3/uL — ABNORMAL HIGH (ref 4.0–10.5)
nRBC: 0.3 % — ABNORMAL HIGH (ref 0.0–0.2)

## 2023-04-17 LAB — BASIC METABOLIC PANEL
Anion gap: 14 (ref 5–15)
BUN: 17 mg/dL (ref 6–20)
CO2: 34 mmol/L — ABNORMAL HIGH (ref 22–32)
Calcium: 8.9 mg/dL (ref 8.9–10.3)
Chloride: 88 mmol/L — ABNORMAL LOW (ref 98–111)
Creatinine, Ser: 1.18 mg/dL — ABNORMAL HIGH (ref 0.44–1.00)
GFR, Estimated: >60 mL/min (ref >60)
Glucose, Bld: 92 mg/dL (ref 70–99)
Potassium: 4.4 mmol/L (ref 3.5–5.1)
Sodium: 136 mmol/L (ref 135–145)

## 2023-04-17 LAB — CALPROTECTIN, FECAL: Calprotectin, Fecal: 35 ug/g (ref 0–120)

## 2023-04-17 NOTE — Plan of Care (Signed)
  Problem: Education: Goal: Knowledge of General Education information will improve Description: Including pain rating scale, medication(s)/side effects and non-pharmacologic comfort measures Outcome: Progressing   Problem: Health Behavior/Discharge Planning: Goal: Ability to manage health-related needs will improve Outcome: Progressing   Problem: Clinical Measurements: Goal: Will remain free from infection Outcome: Progressing Goal: Diagnostic test results will improve Outcome: Progressing   Problem: Nutrition: Goal: Adequate nutrition will be maintained Outcome: Progressing   Problem: Coping: Goal: Level of anxiety will decrease Outcome: Progressing   Problem: Coping: Goal: Level of anxiety will decrease Outcome: Progressing

## 2023-04-17 NOTE — Evaluation (Addendum)
 Physical Therapy Evaluation Patient Details Name: RYLEA SELWAY MRN: [id number] DOB: Jun 16, 1985 Today's Date: 04/17/2023  History of Present Illness  Patient is a 38 year old female with Acute Gastroenteritis, Alcoholic Hepatitis, Chronic Liver disease with concern for progression to Cirrhosis, ascites, and hyperammonemia. She developed alcohol withdrawal and acute hypoxic respiratory failure  Clinical Impression  Patient is agreeable to PT evaluation. She is slow to follow commands. She reports she was independent with mobility at baseline and working as a engineer, structural. She lives with multiple family members.  Today, the patient has dyspnea with exertion and is fatigued with minimal activity. She walked 2 bouts of 32ft with rolling walker with rest breaks required. Sp02 in the low 90's on 4 L02 with mobility. The patient is not at her baseline level of functional mobility. Recommend PT follow up to maximize independence and decrease caregiver burden.       If plan is discharge home, recommend the following: A little help with walking and/or transfers;A little help with bathing/dressing/bathroom;Assist for transportation;Help with stairs or ramp for entrance   Can travel by private vehicle        Equipment Recommendations Rolling walker (2 wheels)  Recommendations for Other Services       Functional Status Assessment Patient has had a recent decline in their functional status and demonstrates the ability to make significant improvements in function in a reasonable and predictable amount of time.     Precautions / Restrictions Precautions Precautions: Fall Restrictions Weight Bearing Restrictions Per Provider Order: No      Mobility  Bed Mobility Overal bed mobility: Needs Assistance Bed Mobility: Supine to Sit, Sit to Supine     Supine to sit: Min assist, HOB elevated Sit to supine: Contact guard assist   General bed mobility comments: assistance required for trunk support to  sit upright.    Transfers Overall transfer level: Needs assistance Equipment used: Rolling walker (2 wheels) Transfers: Sit to/from Stand Sit to Stand: Contact guard assist           General transfer comment: 2 standing bouts performed    Ambulation/Gait Ambulation/Gait assistance: Contact guard assist Gait Distance (Feet): 5 Feet (x 2 bouts) Assistive device: Rolling walker (2 wheels) Gait Pattern/deviations: Decreased stride length, Narrow base of support, Staggering right Gait velocity: decreased     General Gait Details: one mild bout of unsteadiness. patient fatigued with minimal activity and required seated rest break between 2 short bouts of walking  Stairs            Wheelchair Mobility     Tilt Bed    Modified Rankin (Stroke Patients Only)       Balance Overall balance assessment: Needs assistance Sitting-balance support: Feet supported Sitting balance-Leahy Scale: Fair     Standing balance support: Bilateral upper extremity supported Standing balance-Leahy Scale: Poor Standing balance comment: external support of rolling walker                             Pertinent Vitals/Pain      Home Living Family/patient expects to be discharged to:: Private residence Living Arrangements: Spouse/significant other;Children;Other relatives Available Help at Discharge: Family;Available PRN/intermittently Type of Home: House Home Access: Stairs to enter Entrance Stairs-Rails: None Entrance Stairs-Number of Steps: 3   Home Layout: One level        Prior Function Prior Level of Function : Independent/Modified Independent;Working/employed  Mobility Comments: patient works as a engineer, structural for an elderly lady. independent with mobility prior to admission       Extremity/Trunk Assessment   Upper Extremity Assessment Upper Extremity Assessment: Generalized weakness    Lower Extremity Assessment Lower Extremity Assessment:  Generalized weakness (endurance impaired for sustained activity)       Communication   Communication Communication: No apparent difficulties  Cognition Arousal: Alert Behavior During Therapy: Flat affect Overall Cognitive Status: No family/caregiver present to determine baseline cognitive functioning                                 General Comments: Patient is slow to respond at times. She can follow single step commands with increased time        General Comments General comments (skin integrity, edema, etc.): patient fatigued with minimal activity    Exercises     Assessment/Plan    PT Assessment Patient needs continued PT services  PT Problem List Decreased strength;Decreased activity tolerance;Decreased balance;Decreased mobility;Decreased knowledge of use of DME;Cardiopulmonary status limiting activity       PT Treatment Interventions DME instruction;Gait training;Stair training;Functional mobility training;Therapeutic activities;Therapeutic exercise;Balance training;Neuromuscular re-education;Cognitive remediation;Patient/family education    PT Goals (Current goals can be found in the Care Plan section)  Acute Rehab PT Goals Patient Stated Goal: to get better PT Goal Formulation: With patient Time For Goal Achievement: 05/01/23 Potential to Achieve Goals: Fair    Frequency Min 1X/week     Co-evaluation               AM-PAC PT 6 Clicks Mobility  Outcome Measure Help needed turning from your back to your side while in a flat bed without using bedrails?: A Little Help needed moving from lying on your back to sitting on the side of a flat bed without using bedrails?: A Little Help needed moving to and from a bed to a chair (including a wheelchair)?: A Little Help needed standing up from a chair using your arms (e.g., wheelchair or bedside chair)?: A Little Help needed to walk in hospital room?: A Little Help needed climbing 3-5 steps with a  railing? : A Little 6 Click Score: 18    End of Session Equipment Utilized During Treatment: Oxygen Activity Tolerance: Patient limited by fatigue Patient left: in bed;with call bell/phone within reach;with nursing/sitter in room Nurse Communication: Mobility status PT Visit Diagnosis: Unsteadiness on feet (R26.81);Muscle weakness (generalized) (M62.81)    Time: 9161-9141 PT Time Calculation (min) (ACUTE ONLY): 20 min   Charges:   PT Evaluation $PT Eval Moderate Complexity: 1 Mod PT Treatments $Therapeutic Activity: 8-22 mins PT General Charges $$ ACUTE PT VISIT: 1 Visit         Randine Essex, PT, MPT   Randine LULLA Essex 04/17/2023, 11:04 AM

## 2023-04-17 NOTE — Evaluation (Signed)
 Occupational Therapy Evaluation Patient Details Name: Katie Stanley MRN: [id number] DOB: 07-20-1985 Today's Date: 04/17/2023   History of Present Illness Pt is a 38 year old female admitted with Acute Gastroenteritis, Alcoholic Hepatitis, Chronic Liver disease with concern for progression to Cirrhosis, ascites, and hyperammonemia.  Course complicated by developing Alcohol Withdrawal and Acute Hypoxic Respiratory Failure.cute gastroenteritis vs chronic diarrheaAcute hypoxic respiratory failure most likely due to pulmonary edema and COPD exacerbation, alcoholic hepatitis, hyper ammonia, AKI, lactic acidosis    PMH significant for diet-controlled diabetes, chronic liver disease-MAFLD/MASH, self cleared HCV, HAV,   polysubstance abuse, tobacco abuse, alcohol abuse, GERD, depression with anxiety, anemia, obesity   Clinical Impression   Chart reviewed, pt greeted standing at edge of bed with HR in 130s stating she has to use bathroom. Pt is alert, flat affect, oriented to self and place. Poor safety awareness. PTA pt is generally indep in ADL/IADL, works FT, amb with no AD. Pt presents with deficits in strength, endurance, activity tolerance, balance, cognition affecting safe and optimal ADL completion. MIN A required for short amb transfers in the room, toileting completed with MAX A. LB dressing completed with MIN A. Pt will benefit from acute OT to address deficits and to facilitate hospital ADL performance. Pt  is left in bed with safety maintained, all needs met.       If plan is discharge home, recommend the following: A little help with walking and/or transfers;A little help with bathing/dressing/bathroom;Supervision due to cognitive status;Direct supervision/assist for financial management;Direct supervision/assist for medications management    Functional Status Assessment  Patient has had a recent decline in their functional status and demonstrates the ability to make significant improvements in  function in a reasonable and predictable amount of time.  Equipment Recommendations  BSC/3in1    Recommendations for Other Services       Precautions / Restrictions Precautions Precautions: Fall Restrictions Weight Bearing Restrictions Per Provider Order: No      Mobility Bed Mobility Overal bed mobility: Needs Assistance Bed Mobility: Sit to Supine       Sit to supine: Contact guard assist, HOB elevated        Transfers Overall transfer level: Needs assistance Equipment used: 1 person hand held assist Transfers: Sit to/from Stand Sit to Stand: Min assist                  Balance Overall balance assessment: Needs assistance Sitting-balance support: Feet supported Sitting balance-Leahy Scale: Fair     Standing balance support: Single extremity supported Standing balance-Leahy Scale: Poor                             ADL either performed or assessed with clinical judgement   ADL Overall ADL's : Needs assistance/impaired Eating/Feeding: Supervision/ safety;Sitting                   Lower Body Dressing: Minimal assistance;Sitting/lateral leans Lower Body Dressing Details (indicate cue type and reason): donn/doff socks Toilet Transfer: Minimal assistance;BSC/3in1 Toilet Transfer Details (indicate cue type and reason): short amb transfer with hand held assist, intermittent vcs for technique Toileting- Clothing Manipulation and Hygiene: Maximal assistance;Sit to/from stand Toileting - Clothing Manipulation Details (indicate cue type and reason): peri care following continent BM     Functional mobility during ADLs: Minimal assistance (approx 5' in room. Further mobility limited by increased BP on this date.)       Vision Patient Visual Report:  No change from baseline Additional Comments: will continue to assess     Perception         Praxis         Pertinent Vitals/Pain Pain Assessment Pain Assessment: No/denies pain      Extremity/Trunk Assessment Upper Extremity Assessment Upper Extremity Assessment: Generalized weakness   Lower Extremity Assessment Lower Extremity Assessment: Generalized weakness       Communication Communication Communication: Difficulty following commands/understanding Following commands: Follows one step commands with increased time Cueing Techniques: Verbal cues;Tactile cues;Visual cues   Cognition Arousal: Alert Behavior During Therapy: Flat affect Overall Cognitive Status: No family/caregiver present to determine baseline cognitive functioning Area of Impairment: Orientation, Attention, Memory, Following commands, Awareness, Problem solving                 Orientation Level: Disoriented to, Situation Current Attention Level: Sustained Memory: Decreased short-term memory Following Commands: Follows one step commands with increased time   Awareness: Emergent Problem Solving: Slow processing, Decreased initiation, Requires verbal cues, Requires tactile cues General Comments: Pt is noted to be out of bed standing when this therapist entered the room, unable to report what she needed without increased time     General Comments  potential breakdown noted on sacrum, nursing notified. BP 151/105 after toileting, spo2 >95% on 4L, HR 110s bpm after mobility. When therapist entered room, HR in 130s with pt standing at edge of bed    Exercises Other Exercises Other Exercises: edu re: role of OT, role of rehab, discharge recommendations   Shoulder Instructions      Home Living Family/patient expects to be discharged to:: Private residence Living Arrangements: Spouse/significant other;Children;Other relatives Available Help at Discharge: Family;Available PRN/intermittently Type of Home: House Home Access: Stairs to enter [employer] of Steps: 3 Entrance Stairs-Rails: None Home Layout: One level               Home Equipment: None           Prior Functioning/Environment Prior Level of Function : Independent/Modified Independent;Working/employed             Mobility Comments: indep with no AD ADLs Comments: indep in ADL/IADL, worked full time as a Production Designer, Theatre/television/film Problem List: Decreased strength;Decreased activity tolerance;Decreased knowledge of use of DME or AE;Decreased safety awareness;Impaired balance (sitting and/or standing);Decreased cognition      OT Treatment/Interventions: Self-care/ADL training;DME and/or AE instruction;Therapeutic activities;Balance training;Therapeutic exercise;Cognitive remediation/compensation;Neuromuscular education;Energy conservation;Patient/family education    OT Goals(Current goals can be found in the care plan section) Acute Rehab OT Goals Patient Stated Goal: get stronger OT Goal Formulation: With patient Time For Goal Achievement: 05/01/23 Potential to Achieve Goals: Good ADL Goals Pt Will Perform Grooming: with modified independence;sitting Pt Will Perform Lower Body Dressing: with modified independence;sit to/from stand Pt Will Transfer to Toilet: with modified independence;ambulating Pt Will Perform Toileting - Clothing Manipulation and hygiene: with modified independence;sit to/from stand Additional ADL Goal #1: pt will participate in further cognitive assessment (i.e pill box test) to further assess functional cognition and improve medication management  OT Frequency: Min 1X/week    Co-evaluation              AM-PAC OT 6 Clicks Daily Activity     Outcome Measure Help from another person eating meals?: None Help from another person taking care of personal grooming?: A Little Help from another person toileting, which includes using toliet, bedpan, or urinal?: A Lot Help from another person  bathing (including washing, rinsing, drying)?: A Lot Help from another person to put on and taking off regular upper body clothing?: A Little Help from another person to  put on and taking off regular lower body clothing?: A Little 6 Click Score: 17   End of Session Equipment Utilized During Treatment: Oxygen Nurse Communication: Mobility status  Activity Tolerance: Patient limited by fatigue Patient left: in bed;with call bell/phone within reach;with bed alarm set  OT Visit Diagnosis: Other abnormalities of gait and mobility (R26.89);Muscle weakness (generalized) (M62.81)                Time: 8951-8894 OT Time Calculation (min): 17 min Charges:  OT General Charges $OT Visit: 1 Visit OT Evaluation $OT Eval Moderate Complexity: 1 Mod  Therisa Sheffield, OTD OTR/L  04/17/23, 12:44 PM

## 2023-04-17 NOTE — Progress Notes (Signed)
 Progress Note   Patient: Katie Stanley FMW:[id number] DOB: February 02, 1986 DOA: 04/09/2023     8 DOS: the patient was seen and examined on 04/17/2023   Brief hospital course:   From HPI Rosalina Dingwall Gravett is a 38 y.o. female with PMH of diet-controlled diabetes, chronic liver disease-MAFLD/MASH, self cleared HCV, HAV,   polysubstance abuse, tobacco abuse, alcohol abuse, GERD, depression with anxiety, anemia, obesity, who presents with nausea, vomiting, diarrhea, abdominal pain, abdominal distention.    Patient was recently hospitalized from 11/1 - 11/4 due to multiple electrolytes disturbance and diarrhea.  Patient had positive C. difficile antigen, but negative toxigenic C. difficile PCR.  Vancomycin  was not continued at discharge after discussing with GI.   Patient states that she has nausea, vomiting, diarrhea and abdominal pain for more than 1 week this time.  She has more than 10 times of nonbilious nonbloody vomiting and more than 10 times of watery diarrhea each day.  Her abdominal pain is diffuse, constant, cramping, nonradiating, not alleviated or aggravated by any known factors.  She states that her abdomen has been gradually distended recently. No fever or chills.  Patient has mild dry cough and mild shortness or breath.  She also reports chest discomfort earlier, which has really resolved.  Currently no active chest pain.  Denies symptoms of UTI.     Data reviewed independently and ED Course: pt was found to have WBC 6.0, magnesium  1.1, potassium 2.8, AKI with creatinine 1.56, BUN <5.0, and GFR 44 (recent creatinine 1.17 on 03/06/2023, and 0.64 on 06/24/2022), troponin level 17, lactic acid 6.2, INR 1.3, negative PCR for COVID, flu and RSV, abnormal liver function (ALP 386, AST 119, ALT 38, total bilirubin 6.3, direct bilirubin 0.5), lipase 33.  Chest x-ray negative.  Patient is admitted to telemetry bed as inpatient.  Dr. Desiderio of general surgery is consulted.         Assessment and  Plan:   # Acute gastroenteritis vs chronic diarrhea-Resolving  Patient presented with N/V/D and abdominal pain with distention CT scan shows inflammation of small and large intestine infectious versus inflammatory. Possibly related to ascites. WBC and procalcitonin elevated Negative C. difficile and GI pathogen panel S/p IV fluids Discontinued IV fluid due to significant ascites, vital signs remained stable. S/p vancomycin  given in the ED, discontinued aztreonam  S/p 5d course of Flagyl , and ceftriaxone  d6/7  for possible SBP Patient has had 1-3 bowel movements since being in the hospital.  Bowel movement improved Currently on lactulose      # Ascites, possible SBP Chronic liver disease may have progressed to liver cirrhosis Elevated WBC count Procalcitonin 2.0 elevated Continue current antibiotics S/p paracentesis 5 L fluid was tapped by IR on 12/31 S/p albumin  25 g IV one-time dose on 12/31 Ascitic Fluid culture No growth, blood culture NGTD S/p paracentesis 1.4L fluid removed by PCCM 1/4 S/p albumin  12.5g x1  Fluid Gram stain negative for organisms, culture no growth less than 24 hours GI following On 1/2 started Lasix  40 mg IV twice daily Continue lasix  40mg  PO, spironolactone  50mg  every day now that patient more awake and blood pressure will allow    # Acute hypoxic respiratory failure most likely due to pulmonary edema and COPD exacerbation Titrate down oxygen as tolerated On 1/2 started Solu-Medrol  40 mg IV twice daily for 2 days followed by 40 mg IV daily for 3 days, steroids were discontinued 1/4 Continue breo ellipta  and as needed duonebs Continue diuresis per above  Continue current oxygen  requirement     # Alcoholic hepatitis # Hyperbilirubinemia MDF 22 CT shows inflammation with fatty infiltration of liver  Monitor liver function   # 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  abstinence counseling done  Last drink was 3 days before admission Continue CIWA protocol and watch for withdrawal symptoms Continue multivitamin, folate and thiamine  S/p Librium  taper  Was briefly on naltrexone  in the past. Is open to starting acamprosate when more stable.    # History of polysubstance use, UDS positive for THC and opiates Drug abuse abstinence counseling done Transition of care manager on board   # GERD:  Continue PPI   # Depression,  Continue Celexa      # Macrocytic anemia and thrombocytopenia due to EtOH use Monitor H&H   # Isotonic hyponatremia, most likely nutritional deficiency. Resolved  serum osmolality 285 wnl # Hypokalemia, potassium repleted. # Hypophosphatemia, Phos repleted. # Hypomagnesemia, mag repleted. Continue to monitor electrolyte   Body mass index is 35.15 kg/m.  Interventions:   Diet: Regular diet DVT Prophylaxis: Subcutaneous Heparin      Advance goals of care discussion: Full code   Family Communication: family was not present at bedside, at the time of interview.    Subjective:  Patient seen and examined at bedside this morning Continues to require 4 L of intranasal oxygen Denied worsening abdominal pain chest pain cough   Physical Exam: Constitutional: In no acute distress Cardiovascular: Normal rate, regular rhythm. No lower extremity edema  Pulmonary: Non labored breathing on room air, no wheezing or rales.  Abdominal: Soft. Normal bowel  sounds. mildly distended and non tender Musculoskeletal: Normal range of motion.     Neurological: Alert and oriented to person, place, and time. Non focal  Skin: Skin is warm and dry.       Data Reviewed:    Latest Ref Rng & Units 04/17/2023    4:26 AM 04/16/2023    4:11 AM 04/15/2023    4:34 AM  CBC  WBC 4.0 - 10.5 K/uL 18.7  13.2  11.0   Hemoglobin 12.0 - 15.0 g/dL 88.7  89.5  9.9   Hematocrit 36.0 - 46.0 % 33.6  31.1  30.9   Platelets 150 - 400 K/uL 217  193  176        Latest Ref Rng & Units 04/17/2023    4:26 AM 04/16/2023    4:11 AM 04/15/2023    1:08 PM  BMP  Glucose 70 - 99 mg/dL 92  94  841   BUN 6 - 20 mg/dL 17  13  10    Creatinine 0.44 - 1.00 mg/dL 8.81  8.85  8.67   Sodium 135 - 145 mmol/L 136  138  137   Potassium 3.5 - 5.1 mmol/L 4.4  4.4  3.9   Chloride 98 - 111 mmol/L 88  90  91   CO2 22 - 32 mmol/L 34  35  34   Calcium  8.9 - 10.3 mg/dL 8.9  9.0  8.6      Vitals:   04/17/23 1000 04/17/23 1100 04/17/23 1200 04/17/23 1343  BP: (!) 151/107  (!) 151/115 (!) 145/105  Pulse: (!) 103 (!) 105 (!) 103 (!) 106  Resp: (!) 24 14 (!) 28 16  Temp:    97.7 F (36.5 C)  TempSrc:    Oral  SpO2: 95% 94% 94% 94%  Weight:      Height:         Author: Drue ONEIDA Potter, MD 04/17/2023 3:22 PM  For on call review [email or web address].

## 2023-04-17 NOTE — Progress Notes (Signed)
 Daily Progress Note   Patient Name: Katie Stanley       Date: 04/17/2023 DOB: 1986/01/09  Age: 38 y.o. MRN#: [id number] Attending Physician: Dorinda Drue DASEN, MD Primary Care Physician: Vicci Duwaine SQUIBB, DO Admit Date: 04/09/2023  Reason for Consultation/Follow-up: Establishing goals of care  Subjective: Notes and labs reviewed.  In to see patient.  She is currently resting in bed, no family at bedside.  She is very groggy but is able to speak with me.  She states she has been speaking with her family on the phone to check on them.    We discussed her care moving forward, and she is happy that she has now been 7 days without alcohol.  She states she is hopeful to continue this after discharge.     Length of Stay: 8  Current Medications: Scheduled Meds:  . Chlorhexidine  Gluconate Cloth  6 each Topical Daily  . citalopram   20 mg Oral Daily  . feeding supplement  237 mL Oral TID BM  . ferrous sulfate   325 mg Oral Daily  . fluticasone  furoate-vilanterol  1 puff Inhalation Daily  . folic acid   1 mg Oral Daily  . furosemide   40 mg Oral Daily  . guaiFENesin   600 mg Oral BID  . heparin   5,000 Units Subcutaneous Q8H  . lactulose   10 g Oral Daily  . methocarbamol   500 mg Oral BID  . metoprolol  succinate  25 mg Oral Daily  . multivitamin with minerals  1 tablet Oral Daily  . nicotine   21 mg Transdermal Daily  . pantoprazole   40 mg Oral BID AC  . sodium chloride  flush  10-40 mL Intracatheter Q12H  . spironolactone   50 mg Oral Daily  . sucralfate   1 g Oral TID WC & HS  . thiamine   100 mg Oral Daily   Or  . thiamine   100 mg Intravenous Daily    Continuous Infusions:   PRN Meds: acetaminophen , ALPRAZolam , chlorpheniramine-HYDROcodone , hydrALAZINE , ipratropium-albuterol , liver oil-zinc   oxide, ondansetron  (ZOFRAN ) IV, mouth rinse, oxyCODONE , sodium chloride  flush  Physical Exam Pulmonary:     Effort: Pulmonary effort is normal.  Neurological:     Mental Status: She is alert.             Vital Signs: BP (!) 151/107   Pulse (!) 105  Temp 97.8 F (36.6 C) (Oral)   Resp 14   Ht 5' 3 (1.6 m)   Wt 90 kg   SpO2 94%   BMI 35.15 kg/m  SpO2: SpO2: 94 % O2 Device: O2 Device: Nasal Cannula O2 Flow Rate: O2 Flow Rate (L/min): 5 L/min  Intake/output summary: No intake or output data in the 24 hours ending 04/17/23 1138 LBM: Last BM Date : 04/15/23 Baseline Weight: Weight: 88.5 kg Most recent weight: Weight: 90 kg   Patient Active Problem List   Diagnosis Date Noted  . Sepsis (HCC) 04/15/2023  . Nausea vomiting and diarrhea 04/09/2023  . Alcoholic hepatitis with ascites 04/09/2023  . Abnormal LFTs 04/09/2023  . Alcoholic hepatitis without ascites 02/11/2023  . Alcohol use disorder 02/11/2023  . AKI (acute kidney injury) (HCC) 02/09/2023  . Disorder of electrolytes 02/09/2023  . Polysubstance abuse (HCC) 02/09/2023  . Tobacco abuse 02/09/2023  . Depression with anxiety 02/09/2023  . Obesity (BMI 30-39.9) 02/09/2023  . Hypomagnesemia 02/09/2023  . Normocytic anemia 02/09/2023  . GERD (gastroesophageal reflux disease) 02/09/2023  . Hyponatremia 02/09/2023  . HTN (hypertension) 02/09/2023  . Abnormal liver function 02/09/2023  . Hypophosphatemia 02/09/2023  . Diabetes mellitus type 2 with complications (HCC) 02/05/2023  . Hypokalemia 10/27/2022  . Abdominal pain 10/27/2022  . Polyp of transverse colon 09/20/2022  . Hepatic steatosis 04/28/2022  . Vitamin D  deficiency 04/28/2022  . History of cardiac arrest 04/28/2022  . Primary hypertension 05/16/2021  . Alcohol abuse 12/31/2020  . Paroxysmal tachycardia (HCC) 07/22/2019  . Morbid obesity (HCC) 04/29/2018  . Gastroesophageal reflux disease 12/25/2017  . 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HCC)  05/01/2016  . Anxiety 02/25/2015  . Depression, recurrent (HCC) 02/25/2015  . Chronic hepatitis C virus infection (HCC) 07/07/2009    Palliative Care Assessment & Plan    Recommendations/Plan: Continue full code/full scope.  Code Status:    Code Status Orders  (From admission, onward)           Start     Ordered   04/09/23 2116  Full code  Continuous       Question:  By:  Answer:  Consent: discussion documented in EHR   04/09/23 2116           Code Status History     Date Active Date Inactive Code Status Order ID Comments User Context   02/09/2023 2234 02/12/2023 1740 Full Code [id number]  Hilma Rankins, MD ED   10/27/2022 1525 11/01/2022 1951 Full Code [id number]  Eldonna Elspeth PARAS, MD ED   06/21/2022 1718 06/24/2022 1804 Full Code [id number]  Leotis Bogus, MD ED   01/20/2020 1457 01/20/2020 2322 Full Code [id number]  Leonce Garnette BIRCH, MD Inpatient       Prognosis:  Unable to determine   Thank you for allowing the Palliative Medicine Team to assist in the care of this patient.    Camelia Lewis, NP  Please contact Palliative Medicine Team phone at [phone] for questions and concerns.

## 2023-04-18 ENCOUNTER — Inpatient Hospital Stay: Payer: Medicaid Other

## 2023-04-18 DIAGNOSIS — R112 Nausea with vomiting, unspecified: Secondary | ICD-10-CM | POA: Diagnosis not present

## 2023-04-18 DIAGNOSIS — Z7189 Other specified counseling: Secondary | ICD-10-CM | POA: Diagnosis not present

## 2023-04-18 DIAGNOSIS — R197 Diarrhea, unspecified: Secondary | ICD-10-CM | POA: Diagnosis not present

## 2023-04-18 LAB — BASIC METABOLIC PANEL
Anion gap: 17 — ABNORMAL HIGH (ref 5–15)
BUN: 19 mg/dL (ref 6–20)
CO2: 31 mmol/L (ref 22–32)
Calcium: 8.9 mg/dL (ref 8.9–10.3)
Chloride: 89 mmol/L — ABNORMAL LOW (ref 98–111)
Creatinine, Ser: 1.28 mg/dL — ABNORMAL HIGH (ref 0.44–1.00)
GFR, Estimated: 55 mL/min — ABNORMAL LOW (ref >60)
Glucose, Bld: 105 mg/dL — ABNORMAL HIGH (ref 70–99)
Potassium: 4 mmol/L (ref 3.5–5.1)
Sodium: 137 mmol/L (ref 135–145)

## 2023-04-18 LAB — BODY FLUID CULTURE W GRAM STAIN

## 2023-04-18 LAB — CBC WITH DIFFERENTIAL/PLATELET
Abs Immature Granulocytes: 0.36 10*3/uL — ABNORMAL HIGH (ref 0.00–0.07)
Basophils Absolute: 0.1 10*3/uL (ref 0.0–0.1)
Basophils Relative: 0 %
Eosinophils Absolute: 0.1 10*3/uL (ref 0.0–0.5)
Eosinophils Relative: 0 %
HCT: 29.9 % — ABNORMAL LOW (ref 36.0–46.0)
Hemoglobin: 10.3 g/dL — ABNORMAL LOW (ref 12.0–15.0)
Immature Granulocytes: 2 %
Lymphocytes Relative: 15 %
Lymphs Abs: 3.1 10*3/uL (ref 0.7–4.0)
MCH: 33.6 pg (ref 26.0–34.0)
MCHC: 34.4 g/dL (ref 30.0–36.0)
MCV: 97.4 fL (ref 80.0–100.0)
Monocytes Absolute: 2.1 10*3/uL — ABNORMAL HIGH (ref 0.1–1.0)
Monocytes Relative: 10 %
Neutro Abs: 14.6 10*3/uL — ABNORMAL HIGH (ref 1.7–7.7)
Neutrophils Relative %: 73 %
Platelets: 194 10*3/uL (ref 150–400)
RBC: 3.07 MIL/uL — ABNORMAL LOW (ref 3.87–5.11)
RDW: 18.8 % — ABNORMAL HIGH (ref 11.5–15.5)
WBC: 20.3 10*3/uL — ABNORMAL HIGH (ref 4.0–10.5)
nRBC: 0.1 % (ref 0.0–0.2)

## 2023-04-18 LAB — GLUCOSE, CAPILLARY
Glucose-Capillary: 115 mg/dL — ABNORMAL HIGH (ref 70–99)
Glucose-Capillary: 107 mg/dL — ABNORMAL HIGH (ref 70–99)

## 2023-04-18 LAB — CYTOLOGY - NON PAP

## 2023-04-18 MED ORDER — SUCRALFATE 1 GM/10ML PO SUSP: 1.0000 g | Freq: Three times a day (TID) | ORAL | 3 refills | Status: DC

## 2023-04-18 MED ORDER — SPIRONOLACTONE 50 MG PO TABS: 50.0000 mg | ORAL_TABLET | Freq: Every day | ORAL | 1 refills | Status: DC

## 2023-04-18 MED ORDER — LACTULOSE 10 GM/15ML PO SOLN: 10.0000 g | Freq: Every day | ORAL | 0 refills | Status: DC

## 2023-04-18 MED ORDER — ACETAMINOPHEN 500 MG PO TABS: 500.0000 mg | ORAL_TABLET | Freq: Three times a day (TID) | ORAL | 0 refills | Status: DC | PRN

## 2023-04-18 MED ORDER — PANTOPRAZOLE SODIUM 40 MG PO TBEC: 40.0000 mg | DELAYED_RELEASE_TABLET | Freq: Two times a day (BID) | ORAL | 0 refills | Status: DC

## 2023-04-18 MED ORDER — FUROSEMIDE 40 MG PO TABS: 40.0000 mg | ORAL_TABLET | Freq: Two times a day (BID) | ORAL | 1 refills | Status: DC

## 2023-04-18 MED ORDER — FUROSEMIDE 40 MG PO TABS
40.0000 mg | ORAL_TABLET | Freq: Two times a day (BID) | ORAL | Status: DC
  Administered 2023-04-18 – 2023-04-19 (×2): 40 mg via ORAL
  Filled 2023-04-18 (×2): qty 1

## 2023-04-18 MED ORDER — FLUTICASONE FUROATE-VILANTEROL 200-25 MCG/ACT IN AEPB: 1.0000 | INHALATION_SPRAY | Freq: Every day | RESPIRATORY_TRACT | 1 refills | Status: DC

## 2023-04-18 NOTE — Progress Notes (Signed)
 Progress Note   Patient: Katie Stanley FMW:[id number] DOB: 01-01-1986 DOA: 04/09/2023     9 DOS: the patient was seen and examined on 04/18/2023     Brief hospital course:   From HPI Katie Stanley is a 38 y.o. female with PMH of diet-controlled diabetes, chronic liver disease-MAFLD/MASH, self cleared HCV, HAV,   polysubstance abuse, tobacco abuse, alcohol abuse, GERD, depression with anxiety, anemia, obesity, who presents with nausea, vomiting, diarrhea, abdominal pain, abdominal distention.    Patient was recently hospitalized from 11/1 - 11/4 due to multiple electrolytes disturbance and diarrhea.  Patient had positive C. difficile antigen, but negative toxigenic C. difficile PCR.  Vancomycin  was not continued at discharge after discussing with GI.   Patient states that she has nausea, vomiting, diarrhea and abdominal pain for more than 1 week this time.  She has more than 10 times of nonbilious nonbloody vomiting and more than 10 times of watery diarrhea each day.  Her abdominal pain is diffuse, constant, cramping, nonradiating, not alleviated or aggravated by any known factors.  She states that her abdomen has been gradually distended recently. No fever or chills.  Patient has mild dry cough and mild shortness or breath.  She also reports chest discomfort earlier, which has really resolved.  Currently no active chest pain.  Denies symptoms of UTI.     Data reviewed independently and ED Course: pt was found to have WBC 6.0, magnesium  1.1, potassium 2.8, AKI with creatinine 1.56, BUN <5.0, and GFR 44 (recent creatinine 1.17 on 03/06/2023, and 0.64 on 06/24/2022), troponin level 17, lactic acid 6.2, INR 1.3, negative PCR for COVID, flu and RSV, abnormal liver function (ALP 386, AST 119, ALT 38, total bilirubin 6.3, direct bilirubin 0.5), lipase 33.  Chest x-ray negative.  Patient is admitted to telemetry bed as inpatient.  Dr. Desiderio of general surgery is consulted.         Assessment and  Plan:   # Acute gastroenteritis vs chronic diarrhea-Resolving  Patient presented with N/V/D and abdominal pain with distention CT scan shows inflammation of small and large intestine infectious versus inflammatory. Possibly related to ascites. WBC and procalcitonin elevated Negative C. difficile and GI pathogen panel S/p IV fluids Discontinued IV fluid due to significant ascites, vital signs remained stable. S/p vancomycin  given in the ED, discontinued aztreonam  Has completed 5 days course of Flagyl  and 7 days course of ceftriaxone  Bowel movement improved Currently on lactulose      # Ascites, possible SBP Chronic liver disease may have progressed to liver cirrhosis Elevated WBC count could also be due to steroid effect which is currently discontinued S/p paracentesis 5 L fluid was tapped by IR on 12/31 S/p albumin  25 g IV one-time dose on 12/31 Ascitic Fluid culture No growth, blood culture NGTD S/p paracentesis 1.4L fluid removed by PCCM 1/4 S/p albumin  12.5g x1  Fluid Gram stain negative for organisms, culture no growth Abdomen still distended patient requesting to have another paracentesis, order placed Continue lasix  40mg  PO, spironolactone  50mg     # Acute hypoxic respiratory failure most likely due to pulmonary edema and COPD exacerbation Titrate down oxygen as tolerated On 1/2 started Solu-Medrol  40 mg IV twice daily for 2 days followed by 40 mg IV daily for 3 days, steroids were discontinued 1/4 Continue breo ellipta  and as needed duonebs Continue diuresis per above  Continue current oxygen requirement and wean off as tolerated     # Alcoholic hepatitis # Hyperbilirubinemia MDF 22 CT shows inflammation  with fatty infiltration of liver  Monitor renal function   # 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  abstinence counseling done  Last drink was 3 days before admission Continue CIWA protocol and watch for withdrawal symptoms Continue multivitamin, folate and thiamine  S/p Librium  taper  Was briefly on naltrexone  in the past. Is open to starting acamprosate when more stable.    # History of polysubstance use, UDS positive for THC and opiates Drug abuse abstinence counseling done Transition of care manager on board   # GERD:  Continue PPI   # Depression,  Continue Celexa      # Macrocytic anemia and thrombocytopenia due to EtOH use Monitor H&H   # Isotonic hyponatremia, most likely nutritional deficiency. Resolved  serum osmolality 285 wnl # Hypokalemia, potassium repleted. # Hypophosphatemia, Phos repleted. # Hypomagnesemia, mag repleted. Continue to monitor electrolyte   Body mass index is 35.15 kg/m.  Interventions:   Diet: Regular diet DVT Prophylaxis: Subcutaneous Heparin      Advance goals of care discussion: Full code   Family Communication: family was not present at bedside, at the time of interview.    Subjective:  Patient seen and examined at bedside this morning Patient was off oxygen this morning We will continue to monitor saturation Denies worsening abdominal pain chest pain or cough Still complains of abdominal fullness and wanting another paracentesis   Physical Exam: Constitutional: In no acute distress Cardiovascular: Normal rate, regular rhythm. No lower extremity edema  Pulmonary: Non labored breathing on room air, no wheezing or rales.   Abdominal: Soft. Normal bowel sounds. mildly distended and non tender Musculoskeletal: Normal range of motion.     Neurological: Alert and oriented to person, place, and time. Non focal  Skin: Skin is warm and dry.       Data Reviewed:    Latest Ref Rng & Units 04/18/2023    5:17 AM 04/17/2023    4:26 AM 04/16/2023    4:11 AM  BMP  Glucose 70 - 99 mg/dL 894  92  94   BUN 6 - 20 mg/dL 19  17  13    Creatinine 0.44 - 1.00 mg/dL 8.71  8.81  8.85   Sodium 135 - 145 mmol/L 137  136  138   Potassium 3.5 - 5.1 mmol/L 4.0  4.4  4.4   Chloride 98 - 111 mmol/L 89  88  90   CO2 22 - 32 mmol/L 31  34  35   Calcium 8.9 - 10.3 mg/dL 8.9  8.9  9.0        Latest Ref Rng & Units 04/18/2023    5:17 AM 04/17/2023    4:26 AM 04/16/2023    4:11 AM  CBC  WBC 4.0 - 10.5 K/uL 20.3  18.7  13.2   Hemoglobin 12.0 - 15.0 g/dL 89.6  88.7  89.5   Hematocrit 36.0 - 46.0 % 29.9  33.6  31.1   Platelets  150 - 400 K/uL 194  217  193     Vitals:   04/17/23 1343 04/17/23 2208 04/18/23 0745 04/18/23 1514  BP: (!) 145/105 136/87 (!) 130/98 (!) 138/97  Pulse: (!) 106  (!) 115 (!) 107  Resp: 16 18 18 18   Temp: 97.7 F (36.5 C) 98.5 F (36.9 C) 98.8 F (37.1 C) 98.4 F (36.9 C)  TempSrc: Oral Oral  Oral  SpO2: 94% 94% 95% 95%  Weight:      Height:         Author: Drue ONEIDA Potter, MD 04/18/2023 3:51 PM  For on call review [email or web address].

## 2023-04-18 NOTE — Progress Notes (Signed)
 Entered patient's room. Patient noted to be standing up at sink, washing her hair. Introduced patient to role of statistician. Had previously attempted to engage patient while on 1C; however she was disoriented at the time. Intake questions completed. Patient noted to have flat, withdrawn affect.  Patient reports she lives with her fiance, future mother-in-law and her 37 year old son. She DOES have an established PCP. She or her family members drive her to/from appointments.   She endorses chronic alcohol abuse. Patient unsure if she would like to pursue substance abuse treatment or not. Provided with information for [facility], including their outpatient intensive program.

## 2023-04-18 NOTE — TOC Initial Note (Signed)
 Transition of Care [facility]) - Initial/Assessment Note    Patient Details  Name: Katie Stanley MRN: [id number] Date of Birth: [birth year]  [facility]) CM/SW Contact:    Royanne JINNY Bernheim, RN Phone Number: 04/18/2023, 11:43 AM  Clinical Narrative:                  Met with the patient at the bedside She lives at home with her Fiance and his mother and she is able to drive, when she can not drive her Fiance and his mother provide transportation, she is agreeable to a referral being sent to Outpatient rehab to get an appointment for PT She will need a Rolling walker once she is almost medically ready to dc home [employer] will deliver to the bedside  Expected Discharge Plan: OP Rehab Barriers to Discharge: Continued Medical Work up   Patient Goals and CMS Choice            Expected Discharge Plan and Services   Discharge Planning Services: CM Consult   Living arrangements for the past 2 months: Single Family Home                 DME Arranged: Walker rolling DME Agency: [employer] Date DME Agency Contacted: 04/18/23 Time DME Agency Contacted: [phone] Representative spoke with at DME Agency: Thom   [facility] Agency: NA        Prior Living Arrangements/Services Living arrangements for the past 2 months: Single Family Home Lives with:: Self, Relatives, Significant Other Patient language and need for interpreter reviewed:: Yes Do you feel safe going back to the place where you live?: Yes      Need for Family Participation in Patient Care: Yes (Comment) Care giver support system in place?: Yes (comment)   Criminal Activity/Legal Involvement Pertinent to Current Situation/Hospitalization: No - Comment as needed  Activities of Daily Living   ADL Screening (condition at time of admission) Independently performs ADLs?: No Does the patient have a NEW difficulty with bathing/dressing/toileting/self-feeding that is expected to last >3 days?: Yes (Initiates electronic notice to  provider for possible OT consult) Does the patient have a NEW difficulty with getting in/out of bed, walking, or climbing stairs that is expected to last >3 days?: Yes (Initiates electronic notice to provider for possible PT consult) Does the patient have a NEW difficulty with communication that is expected to last >3 days?: No Is the patient deaf or have difficulty hearing?: No Does the patient have difficulty seeing, even when wearing glasses/contacts?: No Does the patient have difficulty concentrating, remembering, or making decisions?: Yes  Permission Sought/Granted   Permission granted to share information with : Yes, Verbal Permission Granted              Emotional Assessment Appearance:: Appears older than stated age Attitude/Demeanor/Rapport: Engaged Affect (typically observed): Pleasant Orientation: : Oriented to Self, Oriented to Place, Oriented to  Time, Oriented to Situation Alcohol / Substance Use: Not Applicable Psych Involvement: No (comment)  Admission diagnosis:  Enterocolitis [K52.9] Other ascites [R18.8] Nausea vomiting and diarrhea [R11.2, R19.7] Sepsis, due to unspecified organism, unspecified whether acute organ dysfunction present [facility]) [A41.9] Patient Active Problem List   Diagnosis Date Noted   Sepsis (HCC) 04/15/2023   Nausea vomiting and diarrhea 04/09/2023   Alcoholic hepatitis with ascites 04/09/2023   Abnormal LFTs 04/09/2023   Alcoholic hepatitis without ascites 02/11/2023   Alcohol use disorder 02/11/2023   AKI (acute kidney injury) (HCC) 02/09/2023   Disorder of electrolytes 02/09/2023  Polysubstance abuse (HCC) 02/09/2023   Tobacco abuse 02/09/2023   Depression with anxiety 02/09/2023   Obesity (BMI 30-39.9) 02/09/2023   Hypomagnesemia 02/09/2023   Normocytic anemia 02/09/2023   GERD (gastroesophageal reflux disease) 02/09/2023   Hyponatremia 02/09/2023   HTN (hypertension) 02/09/2023   Abnormal liver function 02/09/2023    Hypophosphatemia 02/09/2023   Diabetes mellitus type 2 with complications (HCC) 02/05/2023   Hypokalemia 10/27/2022   Abdominal pain 10/27/2022   Polyp of transverse colon 09/20/2022   Hepatic steatosis 04/28/2022   Vitamin D  deficiency 04/28/2022   History of cardiac arrest 04/28/2022   Primary hypertension 05/16/2021   Alcohol abuse 12/31/2020   Paroxysmal tachycardia (HCC) 07/22/2019   Morbid obesity (HCC) 04/29/2018   Gastroesophageal reflux disease 12/25/2017   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HCC) 05/01/2016   Anxiety 02/25/2015   Depression, recurrent (HCC) 02/25/2015   Chronic hepatitis C virus infection (HCC) 07/07/2009   PCP:  Vicci Duwaine SQUIBB, DO Pharmacy:   [facility] [phone] [place],  - [address] AT [facility] OF [address] [facility] [place] [address] Phone: [phone] Fax: [phone]  [id number] [place], [place] - [address] AT [facility] OF [address] [place] [address] Phone: [phone] Fax: [phone]     Social Drivers of Health (SDOH) Social History: SDOH Screenings   Food Insecurity: No Food Insecurity (04/17/2023)  Housing: Low Risk  (04/17/2023)  Transportation Needs: Unknown (04/17/2023)  Utilities: Not At Risk (04/17/2023)  Alcohol Screen: Low Risk  (02/26/2023)  Depression (PHQ2-9): Low Risk  (02/26/2023)  Recent Concern: Depression (PHQ2-9) - Medium Risk (02/09/2023)  Financial Resource Strain: Low Risk  (02/26/2023)  Physical Activity: Insufficiently Active (02/26/2023)  Social Connections: Moderately Isolated (04/17/2023)  Stress: No Stress Concern Present (02/26/2023)  Tobacco Use: High Risk (04/09/2023)  Health Literacy: Adequate Health Literacy (02/26/2023)   SDOH Interventions:     Readmission Risk Interventions     No data to display

## 2023-04-18 NOTE — Progress Notes (Signed)
     [facility] REFERRAL        Occupational Therapy * Physical Therapy * Speech Therapy                           DATE  PATIENT NAME   PATIENT MRN        DIAGNOSIS/DIAGNOSIS CODE   DATE OF DISCHARGE:        PRIMARY CARE PHYSICIAN      PCP PHONE/FAX      Dear Provider (Name: [facility] __  Fax: [phone]   I certify that I have examined this patient and that occupational/physical/speech therapy is necessary on an outpatient basis.    The patient has expressed interest in completing their recommended course of therapy at your  location.  Once a formal order from the patient's primary care physician has been obtained, please  contact him/her to schedule an appointment for evaluation at your earliest convenience.   '[ ]'$   Physical Therapy Evaluate and Treat  [  ]  Occupational Therapy Evaluate and Treat  [  ]  Speech Therapy Evaluate and Treat         The patient's primary care physician (listed above) must furnish and be responsible for a formal order such that the recommended services may be furnished while under the primary physician's care, and that the plan of care will be established and reviewed every 30 days (or more often if condition necessitates).

## 2023-04-18 NOTE — Procedures (Signed)
 Patient presents for therapeutic paracentesis. US  limited shows peritoneal fluid noted, but insufficient to perform a safe paracentesis. Patient had evidence of ecchymosis overlying the lower abdominal quadrants, making it difficult to avoid an area that would cause further trauma. Nasal cannula was placed on patient for supplemental oxygen prior to procedure due to oxygen saturation in the 80's. After discussion with my supervising physician, procedure not performed.  Daved SAILOR Challen Spainhour PA-C 04/18/2023 3:35 PM

## 2023-04-18 NOTE — Progress Notes (Addendum)
 Daily Progress Note   Patient Name: Katie Stanley       Date: 04/18/2023 DOB: Nov 09, 1985  Age: 38 y.o. MRN#: [id number] Attending Physician: Dorinda Drue DASEN, MD Primary Care Physician: Vicci Duwaine SQUIBB, DO Admit Date: 04/09/2023  Reason for Consultation/Follow-up: Establishing goals of care  Subjective: Notes and labs reviewed in detail.  In to see patient.  She is oriented but appears sleepy.  She complains of abdominal pain.  She is amenable to further paracentesis if needed.  She is hopeful to continue working towards discharge.    Length of Stay: 9  Current Medications: Scheduled Meds:  . Chlorhexidine  Gluconate Cloth  6 each Topical Daily  . citalopram   20 mg Oral Daily  . feeding supplement  237 mL Oral TID BM  . ferrous sulfate   325 mg Oral Daily  . fluticasone  furoate-vilanterol  1 puff Inhalation Daily  . folic acid   1 mg Oral Daily  . furosemide   40 mg Oral BID  . guaiFENesin   600 mg Oral BID  . heparin   5,000 Units Subcutaneous Q8H  . lactulose   10 g Oral Daily  . methocarbamol   500 mg Oral BID  . metoprolol  succinate  25 mg Oral Daily  . multivitamin with minerals  1 tablet Oral Daily  . nicotine   21 mg Transdermal Daily  . pantoprazole   40 mg Oral BID AC  . sodium chloride  flush  10-40 mL Intracatheter Q12H  . spironolactone   50 mg Oral Daily  . sucralfate   1 g Oral TID WC & HS  . thiamine   100 mg Oral Daily   Or  . thiamine   100 mg Intravenous Daily    Continuous Infusions:   PRN Meds: acetaminophen , ALPRAZolam , chlorpheniramine-HYDROcodone , hydrALAZINE , ipratropium-albuterol , liver oil-zinc  oxide, ondansetron  (ZOFRAN ) IV, mouth rinse, oxyCODONE , sodium chloride  flush  Physical Exam Pulmonary:     Effort: Pulmonary effort is normal.  Abdominal:      Palpations: Abdomen is soft.     Comments: Round abdomen  Neurological:     Mental Status: She is alert.             Vital Signs: BP (!) 130/98   Pulse (!) 115   Temp 98.8 F (37.1 C)   Resp 18   Ht 5' 3 (1.6 m)   Wt 90 kg  SpO2 95%   BMI 35.15 kg/m  SpO2: SpO2: 95 % O2 Device: O2 Device: Nasal Cannula O2 Flow Rate: O2 Flow Rate (L/min): 5 L/min  Intake/output summary: No intake or output data in the 24 hours ending 04/18/23 1220 LBM: Last BM Date : 04/18/23 Baseline Weight: Weight: 88.5 kg Most recent weight: Weight: 90 kg    Patient Active Problem List   Diagnosis Date Noted  . Sepsis (HCC) 04/15/2023  . Nausea vomiting and diarrhea 04/09/2023  . Alcoholic hepatitis with ascites 04/09/2023  . Abnormal LFTs 04/09/2023  . Alcoholic hepatitis without ascites 02/11/2023  . Alcohol use disorder 02/11/2023  . AKI (acute kidney injury) (HCC) 02/09/2023  . Disorder of electrolytes 02/09/2023  . Polysubstance abuse (HCC) 02/09/2023  . Tobacco abuse 02/09/2023  . Depression with anxiety 02/09/2023  . Obesity (BMI 30-39.9) 02/09/2023  . Hypomagnesemia 02/09/2023  . Normocytic anemia 02/09/2023  . GERD (gastroesophageal reflux disease) 02/09/2023  . Hyponatremia 02/09/2023  . HTN (hypertension) 02/09/2023  . Abnormal liver function 02/09/2023  . Hypophosphatemia 02/09/2023  . Diabetes mellitus type 2 with complications (HCC) 02/05/2023  . Hypokalemia 10/27/2022  . Abdominal pain 10/27/2022  . Polyp of transverse colon 09/20/2022  . Hepatic steatosis 04/28/2022  . Vitamin D  deficiency 04/28/2022  . History of cardiac arrest 04/28/2022  . Primary hypertension 05/16/2021  . Alcohol abuse 12/31/2020  . Paroxysmal tachycardia (HCC) 07/22/2019  . Morbid obesity (HCC) 04/29/2018  . Gastroesophageal reflux disease 12/25/2017  . 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HCC) 05/01/2016  . Anxiety 02/25/2015  . Depression, recurrent (HCC) 02/25/2015  . Chronic hepatitis C  virus infection (HCC) 07/07/2009    Palliative Care Assessment & Plan    Recommendations/Plan: Continue full code/full scope  Code Status:    Code Status Orders  (From admission, onward)           Start     Ordered   04/09/23 2116  Full code  Continuous       Question:  By:  Answer:  Consent: discussion documented in EHR   04/09/23 2116           Code Status History     Date Active Date Inactive Code Status Order ID Comments User Context   02/09/2023 2234 02/12/2023 1740 Full Code [id number]  Hilma Rankins, MD ED   10/27/2022 1525 11/01/2022 1951 Full Code [id number]  Eldonna Elspeth PARAS, MD ED   06/21/2022 1718 06/24/2022 1804 Full Code [id number]  Leotis Bogus, MD ED   01/20/2020 1457 01/20/2020 2322 Full Code [id number]  Leonce Garnette BIRCH, MD Inpatient        Care plan was discussed with attending via epic chat  Thank you for allowing the Palliative Medicine Team to assist in the care of this patient.  Camelia Lewis, NP  Please contact Palliative Medicine Team phone at [phone] for questions and concerns.

## 2023-04-19 ENCOUNTER — Telehealth (HOSPITAL_COMMUNITY): Payer: Self-pay | Admitting: Pharmacy Technician

## 2023-04-19 ENCOUNTER — Other Ambulatory Visit (HOSPITAL_COMMUNITY): Payer: Self-pay

## 2023-04-19 DIAGNOSIS — R7989 Other specified abnormal findings of blood chemistry: Secondary | ICD-10-CM

## 2023-04-19 DIAGNOSIS — E878 Other disorders of electrolyte and fluid balance, not elsewhere classified: Secondary | ICD-10-CM | POA: Diagnosis not present

## 2023-04-19 DIAGNOSIS — K7011 Alcoholic hepatitis with ascites: Secondary | ICD-10-CM

## 2023-04-19 DIAGNOSIS — R112 Nausea with vomiting, unspecified: Secondary | ICD-10-CM | POA: Diagnosis not present

## 2023-04-19 DIAGNOSIS — N179 Acute kidney failure, unspecified: Secondary | ICD-10-CM | POA: Diagnosis not present

## 2023-04-19 LAB — CBC WITH DIFFERENTIAL/PLATELET
Abs Immature Granulocytes: 0.27 10*3/uL — ABNORMAL HIGH (ref 0.00–0.07)
Basophils Absolute: 0.1 10*3/uL (ref 0.0–0.1)
Basophils Relative: 1 %
Eosinophils Absolute: 0.3 10*3/uL (ref 0.0–0.5)
Eosinophils Relative: 2 %
HCT: 33 % — ABNORMAL LOW (ref 36.0–46.0)
Hemoglobin: 11.1 g/dL — ABNORMAL LOW (ref 12.0–15.0)
Immature Granulocytes: 2 %
Lymphocytes Relative: 16 %
Lymphs Abs: 2.7 10*3/uL (ref 0.7–4.0)
MCH: 32.7 pg (ref 26.0–34.0)
MCHC: 33.6 g/dL (ref 30.0–36.0)
MCV: 97.3 fL (ref 80.0–100.0)
Monocytes Absolute: 1.5 10*3/uL — ABNORMAL HIGH (ref 0.1–1.0)
Monocytes Relative: 9 %
Neutro Abs: 12.1 10*3/uL — ABNORMAL HIGH (ref 1.7–7.7)
Neutrophils Relative %: 70 %
Platelets: 167 10*3/uL (ref 150–400)
RBC: 3.39 MIL/uL — ABNORMAL LOW (ref 3.87–5.11)
RDW: 19.1 % — ABNORMAL HIGH (ref 11.5–15.5)
WBC: 17 10*3/uL — ABNORMAL HIGH (ref 4.0–10.5)
nRBC: 0 % (ref 0.0–0.2)

## 2023-04-19 LAB — COMPREHENSIVE METABOLIC PANEL
ALT: 38 U/L (ref 0–44)
AST: 91 U/L — ABNORMAL HIGH (ref 15–41)
Albumin: 2.8 g/dL — ABNORMAL LOW (ref 3.5–5.0)
Alkaline Phosphatase: 246 U/L — ABNORMAL HIGH (ref 38–126)
Anion gap: 15 (ref 5–15)
BUN: 18 mg/dL (ref 6–20)
CO2: 29 mmol/L (ref 22–32)
Calcium: 8.4 mg/dL — ABNORMAL LOW (ref 8.9–10.3)
Chloride: 85 mmol/L — ABNORMAL LOW (ref 98–111)
Creatinine, Ser: 1.51 mg/dL — ABNORMAL HIGH (ref 0.44–1.00)
GFR, Estimated: 45 mL/min — ABNORMAL LOW (ref >60)
Glucose, Bld: 96 mg/dL (ref 70–99)
Potassium: 4 mmol/L (ref 3.5–5.1)
Sodium: 132 mmol/L — ABNORMAL LOW (ref 135–145)
Total Bilirubin: 2.4 mg/dL — ABNORMAL HIGH (ref 0.0–1.2)
Total Protein: 7.1 g/dL (ref 6.5–8.1)

## 2023-04-19 LAB — GLUCOSE, CAPILLARY: Glucose-Capillary: 91 mg/dL (ref 70–99)

## 2023-04-19 MED ORDER — OXYCODONE HCL 5 MG PO TABS: 5.0000 mg | ORAL_TABLET | Freq: Four times a day (QID) | ORAL | 0 refills | Status: DC | PRN

## 2023-04-19 NOTE — Progress Notes (Signed)
   04/19/23 1130  Spiritual Encounters  Type of Visit Follow up  Care provided to: Patient  Conversation partners present during encounter Physician  Reason for visit Routine spiritual support  OnCall Visit Yes   Chaplain followed up on previous visit to provide encouragement. Patient was about to be discharged and so visit was cut short.

## 2023-04-19 NOTE — Telephone Encounter (Signed)
 Pharmacy Patient Advocate Encounter   Received notification from Fax that prior authorization for Dulera 200-25 mcg is required/requested.   Insurance verification completed.   The patient is insured through [facility] MEDICAID .   Per test claim:  Advair Diskus, Advair HFA, Duera and Symbicort is preferred by the insurance.  If suggested medication is appropriate, Please send in a new RX and discontinue this one. If not, please advise as to why it's not appropriate so that we may request a Prior Authorization. Please note, some preferred medications may still require a PA

## 2023-04-19 NOTE — Plan of Care (Signed)
   Problem: Health Behavior/Discharge Planning: Goal: Ability to manage health-related needs will improve Outcome: Progressing   Problem: Clinical Measurements: Goal: Ability to maintain clinical measurements within normal limits will improve Outcome: Progressing

## 2023-04-19 NOTE — Discharge Summary (Signed)
 Physician Discharge Summary   Patient: Katie Stanley MRN: [id number] DOB: 09/05/1985  Admit date:     04/09/2023  Discharge date: 04/19/23  Discharge Physician: Concepcion Riser   PCP: Vicci Duwaine SQUIBB, DO   Recommendations at discharge:    PCP follow up in 1 week  Discharge Diagnoses: Principal Problem:   Nausea vomiting and diarrhea Active Problems:   Abdominal pain   Alcoholic hepatitis with ascites   AKI (acute kidney injury) (HCC)   HTN (hypertension)   Hypokalemia   Disorder of electrolytes   Hypomagnesemia   Polysubstance abuse (HCC)   Abnormal LFTs   Alcohol abuse   Tobacco abuse   Depression with anxiety   Obesity (BMI 30-39.9)   Sepsis (HCC)  Resolved Problems:   * No resolved hospital problems. *  Hospital Course: Katie Stanley is a 38 y.o. female with PMH of diet-controlled diabetes, chronic liver disease-MAFLD/MASH, self cleared HCV, HAV,   polysubstance abuse, tobacco abuse, alcohol abuse, GERD, depression with anxiety, anemia, obesity, who presents with nausea, vomiting, diarrhea, abdominal pain, abdominal distention.  She was found to have WBC 6.0, magnesium  1.1, potassium 2.8, AKI with creatinine 1.56, BUN <5.0, and GFR 44 (recent creatinine 1.17 on 03/06/2023, and 0.64 on 06/24/2022), troponin level 17, lactic acid 6.2, INR 1.3, negative PCR for COVID, flu and RSV, abnormal liver function (ALP 386, AST 119, ALT 38, total bilirubin 6.3, direct bilirubin 0.5), lipase 33. Chest x-ray negative. Patient is admitted to telemetry bed as inpatient. Dr. Desiderio of general surgery is consulted.   Assessment and Plan: Acute gastroenteritis vs chronic diarrhea-Resolving  Patient presented with N/V/D and abdominal pain with distention CT scan shows inflammation of small and large intestine infectious versus inflammatory. Possibly related to ascites. WBC and procalcitonin elevated Negative C. difficile and GI pathogen panel Discontinued IV fluid due to  significant ascites, vital signs remained stable. S/p vancomycin  given in the ED, discontinued aztreonam  Has completed 5 days course of Flagyl  and 7 days course of ceftriaxone  Bowel movement improved Advised to continue lactulose  upon discharge.  Nausea, abdominal pain, diarrhea, vomiting improved.   Ascites, possible SBP Chronic liver disease may have progressed to liver cirrhosis Elevated WBC count could also be due to steroid effect which is currently discontinued, WBC improving. S/p paracentesis 5 L fluid was tapped by IR on 12/31 S/p albumin  25 g IV one-time dose on 12/31 Ascitic Fluid culture No growth, blood culture NGTD S/p paracentesis 1.4L fluid removed by PCCM 1/4 S/p albumin  12.5g x1  Fluid Gram stain negative for organisms, culture no growth Abdomen still distended patient deferred paracentesis for later. Continue lasix  40mg  PO, spironolactone  50mg     Acute hypoxic respiratory failure most likely due to pulmonary edema and COPD exacerbation Titrate down oxygen as tolerated On 1/2 started Solu-Medrol  40 mg IV twice daily for 2 days followed by 40 mg IV daily for 3 days, steroids were discontinued 1/4 Continue breo ellipta  and as needed duonebs Continue diuresis as above  Oxygen weaned off as tolerated.   Alcoholic hepatitis Hyperbilirubinemia MDF 22 CT shows inflammation with fatty infiltration of liver  Monitor renal function   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 Continue on lactulose , titrate to 2-3 BM per day repeat ammonia improved    Acute on chronic kidney disease stage 3a Since 11/01/22 Cr 0.83, gradually worsening kidney function Now baseline creatinine 1.5 Continue to avoid nephrotoxic medication, use renally dose medications Monitor BMP as outpatient.   Lactic acidosis most likely due to intravascular  volume depletion/poor excretion of lactate  Lactic acid 5.1, improved with fluids.   EtOH use disorder, Has liver dysfunction due to her alcohol  use. Abstinence counseling done  Last drink was 3 days before admission Continue CIWA protocol and watch for withdrawal symptoms Continue multivitamin, folate and thiamine  S/p Librium  taper, was briefly on naltrexone  in the past. Is open to starting acamprosate when more stable.    History of polysubstance use, UDS positive for THC and opiates Drug abuse abstinence counseling done. Seems motivated to stop.  GERD:  Continue PPI   Depression,  Continue Celexa    Macrocytic anemia and thrombocytopenia due to EtOH use Monitor H&H  Isotonic hyponatremia, most likely nutritional deficiency. Resolved  serum osmolality 285 wnl Hypokalemia, potassium repleted. Hypophosphatemia, Phos repleted. Hypomagnesemia, mag repleted.  Obesity with BMI 35.15 Diet, exercise and weight reduction advised.     Consultants: GI, Critical care, palliative. Procedures performed: paracentesis x2 Disposition: Home Diet recommendation:  Discharge Diet Orders (From admission, onward)     Start     Ordered   04/19/23 0000  Diet - low sodium heart healthy        04/19/23 1139           Cardiac diet DISCHARGE MEDICATION: Allergies as of 04/19/2023       Reactions   Penicillins Swelling   Tolerated ceftriaxone  and cefepime  in past per [facility] and [facility] records        Medication List     TAKE these medications    acetaminophen  500 MG tablet Commonly known as: TYLENOL  Take 1 tablet (500 mg total) by mouth 3 (three) times daily as needed for headache, fever or moderate pain (pain score 4-6).   citalopram  20 MG tablet Commonly known as: CELEXA  TAKE 1.5 TABLET(20 MG) BY MOUTH DAILY   famotidine  20 MG tablet Commonly known as: PEPCID  Take 20 mg by mouth 2 (two) times daily.   fluticasone  furoate-vilanterol 200-25 MCG/ACT Aepb Commonly known as: BREO ELLIPTA  Inhale 1 puff into the lungs daily.   folic acid  1 MG tablet Commonly known as: FOLVITE  Take 1 tablet (1 mg total) by mouth daily.    furosemide  40 MG tablet Commonly known as: LASIX  Take 1 tablet (40 mg total) by mouth 2 (two) times daily.   gabapentin  300 MG capsule Commonly known as: NEURONTIN  Take 300 mg by mouth 2 (two) times daily.   Iron  (Ferrous Sulfate ) 325 (65 Fe) MG Tabs Take 325 mg by mouth daily.   lactulose  10 GM/15ML solution Commonly known as: CHRONULAC  Take 15 mLs (10 g total) by mouth daily.   lidocaine  5 % Commonly known as: Lidoderm  Place 1 patch onto the skin daily. Remove & Discard patch within 12 hours or as directed by MD   MAGNESIUM  PO Take 1 each by mouth in the morning and at bedtime. OTC patient is unsure of the dost   methocarbamol  500 MG tablet Commonly known as: ROBAXIN  Take 1 tablet (500 mg total) by mouth 2 (two) times daily.   metoprolol  succinate 25 MG 24 hr tablet Commonly known as: TOPROL -XL Take 1 tablet (25 mg total) by mouth daily.   multivitamin with minerals Tabs tablet Take 1 tablet by mouth daily.   oxyCODONE  5 MG immediate release tablet Commonly known as: Oxy IR/ROXICODONE  Take 1 tablet (5 mg total) by mouth every 6 (six) hours as needed for severe pain (pain score 7-10).   pantoprazole  40 MG tablet Commonly known as: PROTONIX  Take 1 tablet (40 mg total)  by mouth 2 (two) times daily before a meal.   potassium chloride  SA 20 MEQ tablet Commonly known as: KLOR-CON  M TAKE 1 TABLET(20 MEQ) BY MOUTH DAILY   spironolactone  50 MG tablet Commonly known as: ALDACTONE  Take 1 tablet (50 mg total) by mouth daily.   sucralfate  1 GM/10ML suspension Commonly known as: Carafate  Take 10 mLs (1 g total) by mouth 4 (four) times daily -  with meals and at bedtime.   thiamine  100 MG tablet Commonly known as: Vitamin B-1 Take 1 tablet (100 mg total) by mouth daily.               Durable Medical Equipment  (From admission, onward)           Start     Ordered   04/18/23 1148  For home use only DME Walker rolling  Once       Question Answer Comment   Walker: With 5 Inch Wheels   Patient needs a walker to treat with the following condition Impaired mobility      04/18/23 1147            Follow-up Information     Johnson, Megan P, DO Follow up in 1 week(s).   Specialty: Family Medicine Contact information: [address] [place] [address] [phone]                Discharge Exam: Filed Weights   04/09/23 1110 04/09/23 1113 04/12/23 1112  Weight: 88.5 kg 88 kg 90 kg   General - Young obese Caucasian female, no apparent distress HEENT - PERRLA, EOMI, atraumatic head, non tender sinuses. Lung -distant breath sounds, diffuse rales, rhonchi, no wheezes. Heart - S1, S2 heard, no murmurs, rubs, 1+ pedal edema. Abdomen-soft, distended, positive fluid thrill, bowel sounds good Neuro - Alert, awake and oriented x 3, non focal exam. Skin - Warm and dry.  Condition at discharge: stable  The results of significant diagnostics from this hospitalization (including imaging, microbiology, ancillary and laboratory) are listed below for reference.   Imaging Studies: US  ASCITES (ABDOMEN LIMITED) Result Date: 04/18/2023 INDICATION: Patient is a 38 y/o female with history of diabetes, chronic liver disease, HCV, polysubstance abuse, and ETOH abuse. Patient presents for a therapeutic paracentesis due to ascites. EXAM: ULTRASOUND ABDOMEN LIMITED FINDINGS: Imaging of all 4 quadrants of the abdomen. US  limited shows peritoneal fluid noted, but insufficient to perform a safe paracentesis. Patient had evidence of ecchymosis overlying the lower abdominal quadrants, making it difficult to avoid an area that would cause further trauma. Nasal cannula was placed on patient for supplemental oxygen prior to procedure due to oxygen saturation in the 80's. After discussion with my supervising physician, procedure not performed. IMPRESSION: Small amount of ascites seen on ultrasound. After discussion of the risks versus benefits of the procedure the  patient decided to defer paracentesis at this time. Interpreted by Daved Hipp PA-C Electronically Signed   By: Wilkie Lent M.D.   On: 04/18/2023 16:39   DG Chest Port 1 View Result Date: 04/14/2023 CLINICAL DATA:  38 year old female with history of acute hypoxic respiratory failure. EXAM: PORTABLE CHEST 1 VIEW COMPARISON:  Chest x-ray 04/12/2023. FINDINGS: There is a left upper extremity PICC with tip terminating in the superior cavoatrial junction. Lung volumes are very low. Bibasilar opacities which may reflect areas of atelectasis and/or consolidation. Superimposed moderate right and small left pleural effusions. No pneumothorax. Patchy areas of interstitial prominence an ill-defined opacities are also noted elsewhere in the  lungs bilaterally, likely reflecting bronchopneumonia. No pneumothorax. No evidence of pulmonary edema. Heart size is normal. Upper mediastinal contours are within normal limits allowing for patient positioning and low lung volumes. IMPRESSION: 1. Support apparatus, as above. 2. Worsening aeration in the lungs concerning for developing multilobar bilateral bronchopneumonia. 3. Increasing moderate right and small left pleural effusions with worsening bibasilar areas of atelectasis and/or consolidation. Electronically Signed   By: Toribio Aye M.D.   On: 04/14/2023 08:16   ECHOCARDIOGRAM COMPLETE Result Date: 04/12/2023    ECHOCARDIOGRAM REPORT   Patient Name:   Licet E Studstill Date of Exam: 04/12/2023 Medical Rec #:  [id number]        Height:       63.0 in Accession #:    [id number]       Weight:       198.4 lb Date of Birth:  1985/10/13         BSA:          1.927 m Patient Age:    37 years         BP:           142/69 mmHg Patient Gender: F                HR:           117 bpm. Exam Location:  [facility] Procedure: 2D Echo, Cardiac Doppler and Color Doppler Indications:     CHF  History:         Patient has prior history of Echocardiogram examinations, most                   recent 10/28/2022. CHF, Previous Myocardial Infarction,                  Arrythmias:Tachycardia; Risk Factors:Hypertension, Diabetes and                  Current Smoker. Substance and ETOH abuse.  Sonographer:     Naomie Reef Referring Phys:  [id number] ELVAN SOR Diagnosing Phys: Lonni Hanson MD  Sonographer Comments: Patient is obese. IMPRESSIONS  1. Left ventricular ejection fraction, by estimation, is 55 to 60%. The left ventricle has normal function. The left ventricle has no regional wall motion abnormalities. There is mild left ventricular hypertrophy. Indeterminate diastolic filling due to E-A fusion.  2. Right ventricular systolic function is normal. The right ventricular size is normal. Tricuspid regurgitation signal is inadequate for assessing PA pressure.  3. The mitral valve is normal in structure. No evidence of mitral valve regurgitation. No evidence of mitral stenosis.  4. The aortic valve has an indeterminant number of cusps. Aortic valve regurgitation is not visualized. No aortic stenosis is present. FINDINGS  Left Ventricle: Left ventricular ejection fraction, by estimation, is 55 to 60%. The left ventricle has normal function. The left ventricle has no regional wall motion abnormalities. The left ventricular internal cavity size was normal in size. There is  mild left ventricular hypertrophy. Indeterminate diastolic filling due to E-A fusion. Right Ventricle: The right ventricular size is normal. No increase in right ventricular wall thickness. Right ventricular systolic function is normal. Tricuspid regurgitation signal is inadequate for assessing PA pressure. Left Atrium: Left atrial size was normal in size. Right Atrium: Right atrial size was normal in size. Pericardium: There is no evidence of pericardial effusion. Presence of epicardial fat layer. Mitral Valve: The mitral valve is normal in structure. No evidence of mitral valve regurgitation. No evidence of mitral  valve stenosis. MV  peak gradient, 12.7 mmHg. The mean mitral valve gradient is 6.0 mmHg. Tricuspid Valve: The tricuspid valve is grossly normal. Tricuspid valve regurgitation is not demonstrated. Aortic Valve: The aortic valve has an indeterminant number of cusps. Aortic valve regurgitation is not visualized. No aortic stenosis is present. Aortic valve mean gradient measures 6.0 mmHg. Aortic valve peak gradient measures 13.5 mmHg. Aortic valve area, by VTI measures 2.59 cm. Pulmonic Valve: The pulmonic valve was grossly normal. Pulmonic valve regurgitation is not visualized. No evidence of pulmonic stenosis. Aorta: The aortic root is normal in size and structure. Pulmonary Artery: The pulmonary artery is of normal size. Venous: The inferior vena cava was not well visualized. IAS/Shunts: The interatrial septum was not well visualized.  LEFT VENTRICLE PLAX 2D LVIDd:         5.00 cm LVIDs:         3.60 cm LV PW:         1.10 cm LV IVS:        1.20 cm LVOT diam:     2.00 cm LV SV:         79 LV SV Index:   41 LVOT Area:     3.14 cm  RIGHT VENTRICLE RV Basal diam:  3.35 cm RV Mid diam:    3.20 cm RV S prime:     19.40 cm/s TAPSE (M-mode): 2.1 cm LEFT ATRIUM             Index        RIGHT ATRIUM           Index LA diam:        4.00 cm 2.08 cm/m   RA Area:     14.50 cm LA Vol (A2C):   52.1 ml 27.04 ml/m  RA Volume:   35.70 ml  18.53 ml/m LA Vol (A4C):   32.2 ml 16.71 ml/m LA Biplane Vol: 42.1 ml 21.85 ml/m  AORTIC VALVE                     PULMONIC VALVE AV Area (Vmax):    2.61 cm      PV Vmax:       1.46 m/s AV Area (Vmean):   2.67 cm      PV Peak grad:  8.5 mmHg AV Area (VTI):     2.59 cm AV Vmax:           184.00 cm/s AV Vmean:          111.000 cm/s AV VTI:            0.304 m AV Peak Grad:      13.5 mmHg AV Mean Grad:      6.0 mmHg LVOT Vmax:         153.00 cm/s LVOT Vmean:        94.400 cm/s LVOT VTI:          0.251 m LVOT/AV VTI ratio: 0.83  AORTA Ao Root diam: 3.20 cm MITRAL VALVE MV Area (PHT): 6.96 cm     SHUNTS MV Area  VTI:   3.90 cm     Systemic VTI:  0.25 m MV Peak grad:  12.7 mmHg    Systemic Diam: 2.00 cm MV Mean grad:  6.0 mmHg MV Vmax:       1.78 m/s MV Vmean:      109.0 cm/s MV Decel Time: 109 msec MV E velocity: 32.80 cm/s MV A velocity: 137.00  cm/s MV E/A ratio:  0.24 Lonni Hanson MD Electronically signed by Lonni Hanson MD Signature Date/Time: 04/12/2023/5:27:06 PM    Final    DG Chest Port 1 View Result Date: 04/12/2023 CLINICAL DATA:  Respiratory distress EXAM: PORTABLE CHEST 1 VIEW COMPARISON:  04/09/2023 FINDINGS: The heart size and mediastinal contours are within normal limits. Both lungs are clear. The visualized skeletal structures are unremarkable. IMPRESSION: No acute abnormality of the lungs in AP portable projection. Electronically Signed   By: Marolyn JONETTA Jaksch M.D.   On: 04/12/2023 08:33   US  EKG SITE RITE Result Date: 04/12/2023 If Site Rite image not attached, placement could not be confirmed due to current cardiac rhythm.  US  Paracentesis Result Date: 04/10/2023 INDICATION: Patient with history of ETOH abuse, hepatitis C, fatty liver disease admitted with abdominal pain, nausea, vomiting and diarrhea. CT showed new onset ascites. Request for diagnostic and therapeutic paracentesis. EXAM: ULTRASOUND GUIDED DIAGNOSTIC AND THERAPEUTIC PARACENTESIS MEDICATIONS: 7 mL 1% lidocaine  COMPLICATIONS: None immediate. PROCEDURE: Informed written consent was obtained from the patient after a discussion of the risks, benefits and alternatives to treatment. A timeout was performed prior to the initiation of the procedure. Initial ultrasound scanning demonstrates a large amount of ascites within the right lower abdominal quadrant. The right lower abdomen was prepped and draped in the usual sterile fashion. 1% lidocaine  was used for local anesthesia. Following this, a 19 gauge, 7-cm, Yueh catheter was introduced. An ultrasound image was saved for documentation purposes. The paracentesis was performed. The catheter  was removed and a dressing was applied. The patient tolerated the procedure well without immediate post procedural complication. Patient received post-procedure intravenous albumin ; see nursing notes for details. FINDINGS: A total of approximately 5.0 L of clear yellow fluid was removed. Samples were sent to the laboratory as requested by the clinical team. IMPRESSION: Successful ultrasound-guided paracentesis yielding 5.0 liters of peritoneal fluid. Performed by Clotilda Hesselbach, PA-C Electronically Signed   By: Ester Sides M.D.   On: 04/10/2023 12:17   CT ABDOMEN PELVIS W CONTRAST Result Date: 04/09/2023 CLINICAL DATA:  Abdominal pain. EXAM: CT ABDOMEN AND PELVIS WITH CONTRAST TECHNIQUE: Multidetector CT imaging of the abdomen and pelvis was performed using the standard protocol following bolus administration of intravenous contrast. RADIATION DOSE REDUCTION: This exam was performed according to the departmental dose-optimization program which includes automated exposure control, adjustment of the mA and/or kV according to patient size and/or use of iterative reconstruction technique. CONTRAST:  75mL OMNIPAQUE  IOHEXOL  350 MG/ML SOLN COMPARISON:  10/27/2022. FINDINGS: Lower chest: Minimal dependent subsegmental atelectasis or scarring at the right base. Lung pericardial or pleural effusion. Hepatobiliary: Marked hepatic fatty infiltration. No focal lesions identified. No biliary ductal dilatation. Gallbladder has been removed. Pancreas: Hypodense lesion in the uncinate process pancreas measures 1.8 cm, smaller than on the prior study presumably this represents a pseudocyst. No peripancreatic inflammatory changes currently. Spleen: Normal in size without focal abnormality. Adrenals/Urinary Tract: Adrenal glands are unremarkable. Kidneys are normal, without renal calculi, focal lesion, or hydronephrosis. Bladder is unremarkable. Stomach/Bowel: Gastric wall thickening which may be due to underdistention versus  gastritis. Extensive inflammatory process involving small and large bowel with mucosal thickening. This could represent an infectious process or ischemia. No free air. There is large amount of ascites. No bowel dilatation to suggest obstruction. Appendix is unremarkable, best visualized on coronal image 72. Vascular/Lymphatic: No significant vascular findings are present. No enlarged abdominal or pelvic lymph nodes. Reproductive: Uterus and bilateral adnexa are unremarkable. There is an IUD  in place. Other: No abdominal wall hernia or abnormality. No abdominopelvic ascites. Musculoskeletal: No acute osseous abnormalities. There is spondylolysis at L5 on the right. IMPRESSION: 1. Extensive inflammatory process involving small and large bowel. This could represent an infectious or inflammatory process versus ischemia. 2. Large amount of ascites. 3. Fatty liver. 4. Pancreatic lesion, smaller than on the prior study, presumably a pseudocyst. Electronically Signed   By: Fonda Field M.D.   On: 04/09/2023 20:21   DG Chest 2 View Result Date: 04/09/2023 CLINICAL DATA:  Chest pain. EXAM: CHEST - 2 VIEW COMPARISON:  October 27, 2022. FINDINGS: The heart size and mediastinal contours are within normal limits. Both lungs are clear. Stable elevated right hemidiaphragm. The visualized skeletal structures are unremarkable. IMPRESSION: No active cardiopulmonary disease. Electronically Signed   By: Lynwood Landy Raddle M.D.   On: 04/09/2023 12:17    Microbiology: Results for orders placed or performed during the hospital encounter of 04/09/23  Resp panel by RT-PCR (RSV, Flu A&B, Covid) Anterior Nasal Swab     Status: None   Collection Time: 04/09/23 11:12 AM   Specimen: Anterior Nasal Swab  Result Value Ref Range Status   SARS Coronavirus 2 by RT PCR NEGATIVE NEGATIVE Final    Comment: (NOTE) SARS-CoV-2 target nucleic acids are NOT DETECTED.  The SARS-CoV-2 RNA is generally detectable in upper respiratory specimens  during the acute phase of infection. The lowest concentration of SARS-CoV-2 viral copies this assay can detect is 138 copies/mL. A negative result does not preclude SARS-Cov-2 infection and should not be used as the sole basis for treatment or other patient management decisions. A negative result may occur with  improper specimen collection/handling, submission of specimen other than nasopharyngeal swab, presence of viral mutation(s) within the areas targeted by this assay, and inadequate number of viral copies(<138 copies/mL). A negative result must be combined with clinical observations, patient history, and epidemiological information. The expected result is Negative.  Fact Sheet for Patients:  bloggercourse.com  Fact Sheet for Healthcare Providers:  seriousbroker.it  This test is no t yet approved or cleared by the [place]  FDA and  has been authorized for detection and/or diagnosis of SARS-CoV-2 by FDA under an Emergency Use Authorization (EUA). This EUA will remain  in effect (meaning this test can be used) for the duration of the COVID-19 declaration under Section 564(b)(1) of the Act, 21 U.S.C.section 360bbb-3(b)(1), unless the authorization is terminated  or revoked sooner.       Influenza A by PCR NEGATIVE NEGATIVE Final   Influenza B by PCR NEGATIVE NEGATIVE Final    Comment: (NOTE) The Xpert Xpress SARS-CoV-2/FLU/RSV plus assay is intended as an aid in the diagnosis of influenza from Nasopharyngeal swab specimens and should not be used as a sole basis for treatment. Nasal washings and aspirates are unacceptable for Xpert Xpress SARS-CoV-2/FLU/RSV testing.  Fact Sheet for Patients: bloggercourse.com  Fact Sheet for Healthcare Providers: seriousbroker.it  This test is not yet approved or cleared by the [place]  FDA and has been authorized for detection  and/or diagnosis of SARS-CoV-2 by FDA under an Emergency Use Authorization (EUA). This EUA will remain in effect (meaning this test can be used) for the duration of the COVID-19 declaration under Section 564(b)(1) of the Act, 21 U.S.C. section 360bbb-3(b)(1), unless the authorization is terminated or revoked.     Resp Syncytial Virus by PCR NEGATIVE NEGATIVE Final    Comment: (NOTE) Fact Sheet for Patients: bloggercourse.com  Fact Sheet for Healthcare Providers:  seriousbroker.it  This test is not yet approved or cleared by the [place]  FDA and has been authorized for detection and/or diagnosis of SARS-CoV-2 by FDA under an Emergency Use Authorization (EUA). This EUA will remain in effect (meaning this test can be used) for the duration of the COVID-19 declaration under Section 564(b)(1) of the Act, 21 U.S.C. section 360bbb-3(b)(1), unless the authorization is terminated or revoked.  Performed at [facility], [address] [address] [place], [place] [address]   Culture, blood (x 2)     Status: None   Collection Time: 04/09/23 10:02 PM   Specimen: BLOOD  Result Value Ref Range Status   Specimen Description BLOOD RIGHT ANTECUBITAL  Final   Special Requests   Final    BOTTLES DRAWN AEROBIC ONLY Blood Culture results may not be optimal due to an inadequate volume of blood received in culture bottles   Culture   Final    NO GROWTH 5 DAYS Performed at [facility], [address] [place], [place] [address]    Report Status 04/14/2023 FINAL  Final  Culture, blood (x 2)     Status: None   Collection Time: 04/09/23 10:02 PM   Specimen: BLOOD  Result Value Ref Range Status   Specimen Description BLOOD BLOOD RIGHT FOREARM  Final   Special Requests   Final    BOTTLES DRAWN AEROBIC ONLY Blood Culture results may not be optimal due to an inadequate volume of blood received in culture bottles   Culture   Final    NO  GROWTH 5 DAYS Performed at [facility], [address], [place], [place] [address]    Report Status 04/14/2023 FINAL  Final  C Difficile Quick Screen w PCR reflex     Status: None   Collection Time: 04/09/23 10:14 PM   Specimen: STOOL  Result Value Ref Range Status   C Diff antigen NEGATIVE NEGATIVE Final   C Diff toxin NEGATIVE NEGATIVE Final   C Diff interpretation No C. difficile detected.  Final    Comment: Performed at [facility], [address] [place], [place] [address]  Gastrointestinal Panel by PCR , Stool     Status: None   Collection Time: 04/09/23 10:14 PM   Specimen: Stool  Result Value Ref Range Status   Campylobacter species NOT DETECTED NOT DETECTED Final   Plesimonas shigelloides NOT DETECTED NOT DETECTED Final   Salmonella species NOT DETECTED NOT DETECTED Final   Yersinia enterocolitica NOT DETECTED NOT DETECTED Final   Vibrio species NOT DETECTED NOT DETECTED Final   Vibrio cholerae NOT DETECTED NOT DETECTED Final   Enteroaggregative E coli (EAEC) NOT DETECTED NOT DETECTED Final   Enteropathogenic E coli (EPEC) NOT DETECTED NOT DETECTED Final   Enterotoxigenic E coli (ETEC) NOT DETECTED NOT DETECTED Final   Shiga like toxin producing E coli (STEC) NOT DETECTED NOT DETECTED Final   Shigella/Enteroinvasive E coli (EIEC) NOT DETECTED NOT DETECTED Final   Cryptosporidium NOT DETECTED NOT DETECTED Final   Cyclospora cayetanensis NOT DETECTED NOT DETECTED Final   Entamoeba histolytica NOT DETECTED NOT DETECTED Final   Giardia lamblia NOT DETECTED NOT DETECTED Final   Adenovirus F40/41 NOT DETECTED NOT DETECTED Final   Astrovirus NOT DETECTED NOT DETECTED Final   Norovirus GI/GII NOT DETECTED NOT DETECTED Final   Rotavirus A NOT DETECTED NOT DETECTED Final   Sapovirus (I, II, IV, and V) NOT DETECTED NOT DETECTED Final    Comment: Performed at [facility], [address] [place], [place] [address]  Body fluid culture w Gram Stain      Status: None   Collection Time: 04/10/23 11:15 AM   Specimen: PATH Cytology Peritoneal fluid  Result Value Ref Range Status   Specimen Description   Final    PERITONEAL Performed at [facility], [address], [place], [place] [address]    Special Requests   Final    NONE Performed at [facility], [address] [place], [place] [address]    Gram Stain   Final    WBC PRESENT,BOTH PMN AND MONONUCLEAR NO ORGANISMS SEEN CYTOSPIN SMEAR    Culture   Final    NO GROWTH 3 DAYS Performed at [facility], [address] [place], [place] [address]    Report Status 04/13/2023 FINAL  Final  MRSA Next Gen by PCR, Nasal     Status: None   Collection Time: 04/12/23 11:18 AM   Specimen: Nasal Mucosa; Nasal Swab  Result Value Ref Range Status   MRSA by PCR Next Gen NOT DETECTED NOT DETECTED Final    Comment: (NOTE) The GeneXpert MRSA Assay (FDA approved for NASAL specimens only), is one component of a comprehensive MRSA colonization surveillance program. It is not intended to diagnose MRSA infection nor to guide or monitor treatment for MRSA infections. Test performance is not FDA approved in patients less than 62 years old. Performed at [facility], [address], [place], [place] [address]   Peritoneal fluid culture w Gram Stain     Status: None   Collection Time: 04/14/23 10:26 AM   Specimen: Peritoneal Washings; Peritoneal Fluid  Result Value Ref Range Status   Specimen Description   Final    PERITONEAL Performed at [facility], [address] [place], [place] [address]    Special Requests   Final    NONE Performed at [facility] [place], [address] [place], [place] [address]    Gram Stain   Final    RARE WBC PRESENT, PREDOMINANTLY MONONUCLEAR NO ORGANISMS SEEN    Culture   Final    NO GROWTH 3 DAYS Performed at [facility], [address] [place], [place] [address]    Report Status 04/18/2023 FINAL   Final  Calprotectin, Fecal     Status: None   Collection Time: 04/14/23 11:20 PM   Specimen: Stool  Result Value Ref Range Status   Calprotectin, Fecal 35 0 - 120 ug/g Final    Comment: (NOTE) Concentration     Interpretation   Follow-Up < 5 - 50 ug/g     Normal           None >50 -120 ug/g     Borderline       Re-evaluate in 4-6 weeks    >120 ug/g     Abnormal         Repeat as clinically                                   indicated Performed At: [facility] [address] [place], [place] [id number] Jennette Shorter MD Ey:[id number]     Labs: CBC: Recent Labs  Lab 04/15/23 0434 04/16/23 0411 04/17/23 0426 04/18/23 0517 04/19/23 0819  WBC 11.0* 13.2* 18.7* 20.3* 17.0*  NEUTROABS  --  9.1* 13.3* 14.6* 12.1*  HGB 9.9* 10.4* 11.2* 10.3* 11.1*  HCT 30.9* 31.1*  33.6* 29.9* 33.0*  MCV 106.9* 101.3* 101.2* 97.4 97.3  PLT 176 193 217 194 167   Basic Metabolic Panel: Recent Labs  Lab 04/13/23 0540 04/14/23 0300 04/15/23 0434 04/15/23 1308 04/16/23 0411 04/17/23 0426 04/18/23 0517 04/19/23 0819  NA 138 142 139 137 138 136 137 132*  K 3.9 3.3* 3.2* 3.9 4.4 4.4 4.0 4.0  CL 97* 98 93* 91* 90* 88* 89* 85*  CO2 28 32 31 34* 35* 34* 31 29  GLUCOSE 140* 142* 124* 158* 94 92 105* 96  BUN 6 7 10 10 13 17 19 18   CREATININE 1.36* 1.40* 1.29* 1.32* 1.14* 1.18* 1.28* 1.51*  CALCIUM 8.0* 8.5* 8.6* 8.6* 9.0 8.9 8.9 8.4*  MG 1.8 2.2 1.6*  --  1.7  --   --   --   PHOS 4.3 3.5 1.8*  --  2.3*  --   --   --    Liver Function Tests: Recent Labs  Lab 04/13/23 0540 04/14/23 0300 04/15/23 0434 04/16/23 0411 04/19/23 0819  AST 75* 69* 87* 81* 91*  ALT 38 36 35 36 38  ALKPHOS 278* 269* 242* 251* 246*  BILITOT 2.5* 2.2* 2.0* 2.1* 2.4*  PROT 6.7 6.6 6.3* 6.2* 7.1  ALBUMIN  2.8* 2.7* 2.6* 2.6* 2.8*   CBG: Recent Labs  Lab 04/16/23 0806 04/16/23 1952 04/18/23 0748 04/18/23 1113 04/19/23 0731  GLUCAP 90 78 115* 107* 91    Discharge time spent: 35  minutes.  Signed: Concepcion Riser, MD [facility] 04/19/2023

## 2023-04-21 LAB — MISC LABCORP TEST (SEND OUT): Labcorp test code: 791584

## 2023-05-03 ENCOUNTER — Telehealth: Payer: Self-pay

## 2023-05-08 ENCOUNTER — Encounter: Payer: Self-pay | Admitting: Family Medicine

## 2023-05-08 ENCOUNTER — Ambulatory Visit (INDEPENDENT_AMBULATORY_CARE_PROVIDER_SITE_OTHER): Payer: Medicaid Other | Admitting: Family Medicine

## 2023-05-08 VITALS — BP 121/82 | HR 103 | Wt 168.6 lb

## 2023-05-08 DIAGNOSIS — D649 Anemia, unspecified: Secondary | ICD-10-CM

## 2023-05-08 DIAGNOSIS — D72829 Elevated white blood cell count, unspecified: Secondary | ICD-10-CM

## 2023-05-08 DIAGNOSIS — K7031 Alcoholic cirrhosis of liver with ascites: Secondary | ICD-10-CM | POA: Insufficient documentation

## 2023-05-08 DIAGNOSIS — K7011 Alcoholic hepatitis with ascites: Secondary | ICD-10-CM | POA: Diagnosis not present

## 2023-05-08 DIAGNOSIS — N179 Acute kidney failure, unspecified: Secondary | ICD-10-CM

## 2023-05-08 MED ORDER — METHOCARBAMOL 500 MG PO TABS: 500.0000 mg | ORAL_TABLET | Freq: Two times a day (BID) | ORAL | 1 refills | Status: DC

## 2023-05-08 MED ORDER — VITAMIN B-1 100 MG PO TABS: 100.0000 mg | ORAL_TABLET | Freq: Every day | ORAL | 1 refills | Status: DC

## 2023-05-08 MED ORDER — LACTULOSE 10 GM/15ML PO SOLN: 10.0000 g | Freq: Every day | ORAL | 0 refills | Status: DC

## 2023-05-08 MED ORDER — FUROSEMIDE 40 MG PO TABS: 40.0000 mg | ORAL_TABLET | Freq: Two times a day (BID) | ORAL | 0 refills | Status: DC

## 2023-05-08 MED ORDER — PANTOPRAZOLE SODIUM 40 MG PO TBEC: 40.0000 mg | DELAYED_RELEASE_TABLET | Freq: Two times a day (BID) | ORAL | 0 refills | Status: AC

## 2023-05-08 MED ORDER — SPIRONOLACTONE 50 MG PO TABS: 50.0000 mg | ORAL_TABLET | Freq: Every day | ORAL | 0 refills | Status: DC

## 2023-05-08 NOTE — Progress Notes (Signed)
BP 121/82   Pulse (!) 103   Wt 168 lb 9.6 oz (76.5 kg)   SpO2 95%   BMI 29.87 kg/m    Subjective:    Patient ID: Katie Stanley, female    DOB: 1986/02/25, 38 y.o.   MRN: [id number]  HPI: Katie Stanley is a 38 y.o. female  Chief Complaint  Patient presents with   Hospitalization Follow-up   Nausea   Vomiting   Abdominal Bloating    Patient says she went to the ER for Abdominal Bloating and tightness and they drained 6 liters of fluid. Patient says they got 5 liters off her right side and then 1 liter off her left side. Patient says she would like to discuss having a refill on her Lactose prescription to help with her going to the bathroom. Patient says she still experiencing pain that comes and goes and it is about a 7-8 pain level.    Transition of Care Hospital Follow up.   Hospital/Facility: [facility] D/C Physician: Dr. Clide Dales  D/C Date: 04/19/23  Records Requested: 05/08/23 Records Received: 05/08/23 Records Reviewed: 05/08/23  Diagnoses on Discharge:  Nausea vomiting and diarrhea   Abdominal pain   Alcoholic hepatitis with ascites   AKI (acute kidney injury) (HCC)   HTN (hypertension)   Hypokalemia   Disorder of electrolytes   Hypomagnesemia   Polysubstance abuse (HCC)   Abnormal LFTs   Alcohol abuse   Tobacco abuse   Depression with anxiety   Obesity (BMI 30-39.9)   Sepsis (HCC)    Date of interactive Contact within 48 hours of discharge: NOT DONE Contact was through: N/A  Date of 7 day or 14 day face-to-face visit:  NOT  within 14 days  Outpatient Encounter Medications as of 05/08/2023  Medication Sig Note   acetaminophen (TYLENOL) 500 MG tablet Take 1 tablet (500 mg total) by mouth 3 (three) times daily as needed for headache, fever or moderate pain (pain score 4-6).    citalopram (CELEXA) 20 MG tablet TAKE 1.5 TABLET(20 MG) BY MOUTH DAILY    famotidine (PEPCID) 20 MG tablet Take 20 mg by mouth 2 (two) times daily.    fluticasone furoate-vilanterol (BREO  ELLIPTA) 200-25 MCG/ACT AEPB Inhale 1 puff into the lungs daily.    folic acid (FOLVITE) 1 MG tablet Take 1 tablet (1 mg total) by mouth daily.    gabapentin (NEURONTIN) 300 MG capsule Take 300 mg by mouth 2 (two) times daily.    Iron, Ferrous Sulfate, 325 (65 Fe) MG TABS Take 325 mg by mouth daily.    lidocaine (LIDODERM) 5 % Place 1 patch onto the skin daily. Remove & Discard patch within 12 hours or as directed by MD 04/09/2023: Pt uses as needed   MAGNESIUM PO Take 1 each by mouth in the morning and at bedtime. OTC patient is unsure of the dost    metoprolol succinate (TOPROL-XL) 25 MG 24 hr tablet Take 1 tablet (25 mg total) by mouth daily.    Multiple Vitamin (MULTIVITAMIN WITH MINERALS) TABS tablet Take 1 tablet by mouth daily.    potassium chloride SA (KLOR-CON M) 20 MEQ tablet TAKE 1 TABLET(20 MEQ) BY MOUTH DAILY    sucralfate (CARAFATE) 1 GM/10ML suspension Take 10 mLs (1 g total) by mouth 4 (four) times daily -  with meals and at bedtime.    [DISCONTINUED] furosemide (LASIX) 40 MG tablet Take 1 tablet (40 mg total) by mouth 2 (two) times daily.    [DISCONTINUED]  lisinopril (ZESTRIL) 10 MG tablet     [DISCONTINUED] methocarbamol (ROBAXIN) 500 MG tablet Take 1 tablet (500 mg total) by mouth 2 (two) times daily.    [DISCONTINUED] pantoprazole (PROTONIX) 40 MG tablet Take 1 tablet (40 mg total) by mouth 2 (two) times daily before a meal.    [DISCONTINUED] thiamine (VITAMIN B-1) 100 MG tablet Take 1 tablet (100 mg total) by mouth daily.    furosemide (LASIX) 40 MG tablet Take 1 tablet (40 mg total) by mouth 2 (two) times daily.    lactulose (CHRONULAC) 10 GM/15ML solution Take 15 mLs (10 g total) by mouth daily.    methocarbamol (ROBAXIN) 500 MG tablet Take 1 tablet (500 mg total) by mouth 2 (two) times daily.    pantoprazole (PROTONIX) 40 MG tablet Take 1 tablet (40 mg total) by mouth 2 (two) times daily before a meal.    spironolactone (ALDACTONE) 50 MG tablet Take 1 tablet (50 mg total)  by mouth daily.    thiamine (VITAMIN B-1) 100 MG tablet Take 1 tablet (100 mg total) by mouth daily.    [DISCONTINUED] lactulose (CHRONULAC) 10 GM/15ML solution Take 15 mLs (10 g total) by mouth daily. (Patient not taking: Reported on 05/08/2023)    [DISCONTINUED] oxyCODONE (OXY IR/ROXICODONE) 5 MG immediate release tablet Take 1 tablet (5 mg total) by mouth every 6 (six) hours as needed for severe pain (pain score 7-10). (Patient not taking: Reported on 05/08/2023)    [DISCONTINUED] spironolactone (ALDACTONE) 50 MG tablet Take 1 tablet (50 mg total) by mouth daily. (Patient not taking: Reported on 05/08/2023)    No facility-administered encounter medications on file as of 05/08/2023.  Per Hospitalist: "Hospital Course: Katie Stanley is a 38 y.o. female with PMH of diet-controlled diabetes, chronic liver disease-MAFLD/MASH, self cleared HCV, HAV,   polysubstance abuse, tobacco abuse, alcohol abuse, GERD, depression with anxiety, anemia, obesity, who presents with nausea, vomiting, diarrhea, abdominal pain, abdominal distention.  She was found to have WBC 6.0, magnesium 1.1, potassium 2.8, AKI with creatinine 1.56, BUN <5.0, and GFR 44 (recent creatinine 1.17 on 03/06/2023, and 0.64 on 06/24/2022), troponin level 17, lactic acid 6.2, INR 1.3, negative PCR for COVID, flu and RSV, abnormal liver function (ALP 386, AST 119, ALT 38, total bilirubin 6.3, direct bilirubin 0.5), lipase 33. Chest x-ray negative. Patient is admitted to telemetry bed as inpatient. Dr. Aleen Campi of general surgery is consulted.    Assessment and Plan: Acute gastroenteritis vs chronic diarrhea-Resolving  Patient presented with N/V/D and abdominal pain with distention CT scan shows inflammation of small and large intestine infectious versus inflammatory. Possibly related to ascites. WBC and procalcitonin elevated Negative C. difficile and GI pathogen panel Discontinued IV fluid due to significant ascites, vital signs remained  stable. S/p vancomycin given in the ED, discontinued aztreonam Has completed 5 days course of Flagyl and 7 days course of ceftriaxone Bowel movement improved Advised to continue lactulose upon discharge.  Nausea, abdominal pain, diarrhea, vomiting improved.   Ascites, possible SBP Chronic liver disease may have progressed to liver cirrhosis Elevated WBC count could also be due to steroid effect which is currently discontinued, WBC improving. S/p paracentesis 5 L fluid was tapped by IR on 12/31 S/p albumin 25 g IV one-time dose on 12/31 Ascitic Fluid culture No growth, blood culture NGTD S/p paracentesis 1.4L fluid removed by PCCM 1/4 S/p albumin 12.5g x1  Fluid Gram stain negative for organisms, culture no growth Abdomen still distended patient deferred paracentesis for later. Continue lasix  40mg  PO, spironolactone 50mg     Acute hypoxic respiratory failure most likely due to pulmonary edema and COPD exacerbation Titrate down oxygen as tolerated On 1/2 started Solu-Medrol 40 mg IV twice daily for 2 days followed by 40 mg IV daily for 3 days, steroids were discontinued 1/4 Continue breo ellipta and as needed duonebs Continue diuresis as above  Oxygen weaned off as tolerated.   Alcoholic hepatitis Hyperbilirubinemia MDF 22 CT shows inflammation with fatty infiltration of liver  Monitor renal function   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 Continue on lactulose, titrate to 2-3 BM per day repeat ammonia improved    Acute on chronic kidney disease stage 3a Since 11/01/22 Cr 0.83, gradually worsening kidney function Now baseline creatinine 1.5 Continue to avoid nephrotoxic medication, use renally dose medications Monitor BMP as outpatient.   Lactic acidosis most likely due to intravascular volume depletion/poor excretion of lactate  Lactic acid 5.1, improved with fluids.   EtOH use disorder, Has liver dysfunction due to her alcohol use. Abstinence counseling done  Last drink  was 3 days before admission Continue CIWA protocol and watch for withdrawal symptoms Continue multivitamin, folate and thiamine S/p Librium taper, was briefly on naltrexone in the past. Is open to starting acamprosate when more stable.    History of polysubstance use, UDS positive for THC and opiates Drug abuse abstinence counseling done. Seems motivated to stop.   GERD:  Continue PPI   Depression,  Continue Celexa   Macrocytic anemia and thrombocytopenia due to EtOH use Monitor H&H   Isotonic hyponatremia, most likely nutritional deficiency. Resolved  serum osmolality 285 wnl Hypokalemia, potassium repleted. Hypophosphatemia, Phos repleted. Hypomagnesemia, mag repleted.   Obesity with BMI 35.15 Diet, exercise and weight reduction advised."  Diagnostic Tests Reviewed:  INDICATION: Patient is a 38 y/o female with history of diabetes, chronic liver disease, HCV, polysubstance abuse, and ETOH abuse. Patient presents for a therapeutic paracentesis due to ascites.   EXAM: ULTRASOUND ABDOMEN LIMITED   FINDINGS: Imaging of all 4 quadrants of the abdomen. [place] limited shows peritoneal fluid noted, but insufficient to perform a safe paracentesis. Patient had evidence of ecchymosis overlying the lower abdominal quadrants, making it difficult to avoid an area that would cause further trauma. Nasal cannula was placed on patient for supplemental oxygen prior to procedure due to oxygen saturation in the 80's. After discussion with my supervising physician, procedure not performed.   IMPRESSION: Small amount of ascites seen on ultrasound. After discussion of the risks versus benefits of the procedure the patient decided to defer paracentesis at this time.  CLINICAL DATA:  38 year old female with history of acute hypoxic respiratory failure.   EXAM: PORTABLE CHEST 1 VIEW   COMPARISON:  Chest x-ray 04/12/2023.   FINDINGS: There is a left upper extremity PICC with tip  terminating in the superior cavoatrial junction. Lung volumes are very low. Bibasilar opacities which may reflect areas of atelectasis and/or consolidation. Superimposed moderate right and small left pleural effusions. No pneumothorax. Patchy areas of interstitial prominence an ill-defined opacities are also noted elsewhere in the lungs bilaterally, likely reflecting bronchopneumonia. No pneumothorax. No evidence of pulmonary edema. Heart size is normal. Upper mediastinal contours are within normal limits allowing for patient positioning and low lung volumes.   IMPRESSION: 1. Support apparatus, as above. 2. Worsening aeration in the lungs concerning for developing multilobar bilateral bronchopneumonia. 3. Increasing moderate right and small left pleural effusions with worsening bibasilar areas of atelectasis and/or consolidation.  CLINICAL DATA:  Respiratory distress  EXAM: PORTABLE CHEST 1 VIEW   COMPARISON:  04/09/2023   FINDINGS: The heart size and mediastinal contours are within normal limits. Both lungs are clear. The visualized skeletal structures are unremarkable.   IMPRESSION: No acute abnormality of the lungs in AP portable projection.  INDICATION: Patient with history of ETOH abuse, hepatitis C, fatty liver disease admitted with abdominal pain, nausea, vomiting and diarrhea. CT showed new onset ascites. Request for diagnostic and therapeutic paracentesis.   EXAM: ULTRASOUND GUIDED DIAGNOSTIC AND THERAPEUTIC PARACENTESIS   MEDICATIONS: 7 mL 1% lidocaine   COMPLICATIONS: None immediate.   PROCEDURE: Informed written consent was obtained from the patient after a discussion of the risks, benefits and alternatives to treatment. A timeout was performed prior to the initiation of the procedure.   Initial ultrasound scanning demonstrates a large amount of ascites within the right lower abdominal quadrant. The right lower abdomen was prepped and draped in the  usual sterile fashion. 1% lidocaine was used for local anesthesia.   Following this, a 19 gauge, 7-cm, Yueh catheter was introduced. An ultrasound image was saved for documentation purposes. The paracentesis was performed. The catheter was removed and a dressing was applied. The patient tolerated the procedure well without immediate post procedural complication.   Patient received post-procedure intravenous albumin; see nursing notes for details.   FINDINGS: A total of approximately 5.0 L of clear yellow fluid was removed. Samples were sent to the laboratory as requested by the clinical team.   IMPRESSION: Successful ultrasound-guided paracentesis yielding 5.0 liters of peritoneal fluid.  CLINICAL DATA:  Abdominal pain.   EXAM: CT ABDOMEN AND PELVIS WITH CONTRAST   TECHNIQUE: Multidetector CT imaging of the abdomen and pelvis was performed using the standard protocol following bolus administration of intravenous contrast.   RADIATION DOSE REDUCTION: This exam was performed according to the departmental dose-optimization program which includes automated exposure control, adjustment of the mA and/or kV according to patient size and/or use of iterative reconstruction technique.   CONTRAST:  75mL OMNIPAQUE IOHEXOL 350 MG/ML SOLN   COMPARISON:  10/27/2022.   FINDINGS: Lower chest: Minimal dependent subsegmental atelectasis or scarring at the right base. Lung pericardial or pleural effusion.   Hepatobiliary: Marked hepatic fatty infiltration. No focal lesions identified. No biliary ductal dilatation. Gallbladder has been removed.   Pancreas: Hypodense lesion in the uncinate process pancreas measures 1.8 cm, smaller than on the prior study presumably this represents a pseudocyst. No peripancreatic inflammatory changes currently.   Spleen: Normal in size without focal abnormality.   Adrenals/Urinary Tract: Adrenal glands are unremarkable. Kidneys are normal, without  renal calculi, focal lesion, or hydronephrosis. Bladder is unremarkable.   Stomach/Bowel: Gastric wall thickening which may be due to underdistention versus gastritis. Extensive inflammatory process involving small and large bowel with mucosal thickening. This could represent an infectious process or ischemia. No free air. There is large amount of ascites. No bowel dilatation to suggest obstruction. Appendix is unremarkable, best visualized on coronal image 72.   Vascular/Lymphatic: No significant vascular findings are present. No enlarged abdominal or pelvic lymph nodes.   Reproductive: Uterus and bilateral adnexa are unremarkable. There is an IUD in place.   Other: No abdominal wall hernia or abnormality. No abdominopelvic ascites.   Musculoskeletal: No acute osseous abnormalities. There is spondylolysis at L5 on the right.   IMPRESSION: 1. Extensive inflammatory process involving small and large bowel. This could represent an infectious or inflammatory process versus ischemia. 2. Large amount of ascites. 3. Fatty liver. 4. Pancreatic  lesion, smaller than on the prior study, presumably a pseudocyst.  CLINICAL DATA:  Chest pain.   EXAM: CHEST - 2 VIEW   COMPARISON:  October 27, 2022.   FINDINGS: The heart size and mediastinal contours are within normal limits. Both lungs are clear. Stable elevated right hemidiaphragm. The visualized skeletal structures are unremarkable.   IMPRESSION: No active cardiopulmonary disease.  Disposition: Home  Consults: GI, critical care, palliative, general surgery  Discharge Instructions:  Follow up here  Disease/illness Education: Discussed today  Home Health/Community Services Discussions/Referrals: In place  Establishment or re-establishment of referral orders for community resources: In place  Discussion with other health care providers: None  Assessment and Support of treatment regimen adherence: Fair  Appointments  Coordinated with: Patient  Education for self-management, independent living, and ADLs: Discussed today  Since getting out of the hospital, Nygeria has been feeling a bit better. She notes that she has not drank any alcohol in 27 days. She has not been throwing up. No fevers. No chills. Otherwise feeling OK. She still has a lot of pressure in her belly. No other concerns or complaints at this time.  Relevant past medical, surgical, family and social history reviewed and updated as indicated. Interim medical history since our last visit reviewed. Allergies and medications reviewed and updated.  Review of Systems  Constitutional:  Positive for fatigue. Negative for activity change, appetite change, chills, diaphoresis, fever and unexpected weight change.  Respiratory: Negative.    Cardiovascular: Negative.   Gastrointestinal:  Positive for abdominal pain. Negative for abdominal distention, anal bleeding, blood in stool, constipation, diarrhea, nausea, rectal pain and vomiting.  Neurological:  Positive for weakness. Negative for dizziness, tremors, seizures, syncope, facial asymmetry, speech difficulty, light-headedness, numbness and headaches.    Per HPI unless specifically indicated above     Objective:    BP 121/82   Pulse (!) 103   Wt 168 lb 9.6 oz (76.5 kg)   SpO2 95%   BMI 29.87 kg/m   Wt Readings from Last 3 Encounters:  05/08/23 168 lb 9.6 oz (76.5 kg)  04/12/23 198 lb 6.6 oz (90 kg)  02/26/23 195 lb 3.2 oz (88.5 kg)    Physical Exam Vitals and nursing note reviewed.  Constitutional:      General: She is not in acute distress.    Appearance: Normal appearance. She is not ill-appearing, toxic-appearing or diaphoretic.  HENT:     Head: Normocephalic and atraumatic.     Right Ear: External ear normal.     Left Ear: External ear normal.     Nose: Nose normal.     Mouth/Throat:     Mouth: Mucous membranes are moist.     Pharynx: Oropharynx is clear.  Eyes:     General:  No scleral icterus.       Right eye: No discharge.        Left eye: No discharge.     Extraocular Movements: Extraocular movements intact.     Conjunctiva/sclera: Conjunctivae normal.     Pupils: Pupils are equal, round, and reactive to light.  Cardiovascular:     Rate and Rhythm: Normal rate and regular rhythm.     Pulses: Normal pulses.     Heart sounds: Normal heart sounds. No murmur heard.    No friction rub. No gallop.  Pulmonary:     Effort: Pulmonary effort is normal. No respiratory distress.     Breath sounds: Normal breath sounds. No stridor. No wheezing, rhonchi or rales.  Chest:     Chest wall: No tenderness.  Abdominal:     General: Bowel sounds are normal. There is distension.     Palpations: Abdomen is soft. There is no fluid wave or mass.     Tenderness: There is no abdominal tenderness. There is no right CVA tenderness, left CVA tenderness, guarding or rebound.     Hernia: No hernia is present.  Musculoskeletal:        General: Normal range of motion.     Cervical back: Normal range of motion and neck supple.  Skin:    General: Skin is warm and dry.     Capillary Refill: Capillary refill takes less than 2 seconds.     Coloration: Skin is not jaundiced or pale.     Findings: No bruising, erythema, lesion or rash.  Neurological:     General: No focal deficit present.     Mental Status: She is alert and oriented to person, place, and time. Mental status is at baseline.  Psychiatric:        Mood and Affect: Mood normal. Affect is tearful.        Behavior: Behavior normal.        Thought Content: Thought content normal.        Judgment: Judgment normal.     Results for orders placed or performed in visit on 05/08/23  CBC with Differential/Platelet   Collection Time: 05/08/23  2:09 PM  Result Value Ref Range   WBC 10.0 3.4 - 10.8 x10E3/uL   RBC 4.00 3.77 - 5.28 x10E6/uL   Hemoglobin 13.0 11.1 - 15.9 g/dL   Hematocrit 16.1 09.6 - 46.6 %   MCV 97 79 - 97 fL    MCH 32.5 26.6 - 33.0 pg   MCHC 33.7 31.5 - 35.7 g/dL   RDW 04.5 40.9 - 81.1 %   Platelets 417 150 - 450 x10E3/uL   Neutrophils 70 Not Estab. %   Lymphs 19 Not Estab. %   Monocytes 7 Not Estab. %   Eos 3 Not Estab. %   Basos 1 Not Estab. %   Neutrophils Absolute 7.0 1.4 - 7.0 x10E3/uL   Lymphocytes Absolute 1.9 0.7 - 3.1 x10E3/uL   Monocytes Absolute 0.7 0.1 - 0.9 x10E3/uL   EOS (ABSOLUTE) 0.3 0.0 - 0.4 x10E3/uL   Basophils Absolute 0.1 0.0 - 0.2 x10E3/uL   Immature Granulocytes 0 Not Estab. %   Immature Grans (Abs) 0.0 0.0 - 0.1 x10E3/uL  Comprehensive metabolic panel   Collection Time: 05/08/23  2:09 PM  Result Value Ref Range   Glucose 110 (H) 70 - 99 mg/dL   BUN 16 6 - 20 mg/dL   Creatinine, Ser 9.14 (H) 0.57 - 1.00 mg/dL   eGFR 36 (L) >78 GN/FAO/1.30   BUN/Creatinine Ratio 9 9 - 23   Sodium 140 134 - 144 mmol/L   Potassium 3.8 3.5 - 5.2 mmol/L   Chloride 93 (L) 96 - 106 mmol/L   CO2 20 20 - 29 mmol/L   Calcium 10.3 (H) 8.7 - 10.2 mg/dL   Total Protein 8.9 (H) 6.0 - 8.5 g/dL   Albumin 3.9 3.9 - 4.9 g/dL   Globulin, Total 5.0 (H) 1.5 - 4.5 g/dL   Bilirubin Total 0.9 0.0 - 1.2 mg/dL   Alkaline Phosphatase 324 (H) 44 - 121 IU/L   AST 79 (H) 0 - 40 IU/L   ALT 23 0 - 32 IU/L      Assessment & Plan:  Problem List Items Addressed This Visit       Digestive   Alcoholic cirrhosis of liver with ascites (HCC)   Maintain abstinence from alcohol. Congratulated patient on 27 days without a drink! Call with any concerns. Continue to monitor. Will get her back into GI. Likely needs to have another paracentesis. Await their input.       Relevant Orders   Comprehensive metabolic panel (Completed)   Ambulatory referral to Gastroenterology     Genitourinary   AKI (acute kidney injury) (HCC)   Rechecking labs today. Await results.       Relevant Orders   Comprehensive metabolic panel (Completed)     Other   Alcoholic hepatitis with ascites - Primary   Maintain abstinence  from alcohol. Congratulated patient on 27 days without a drink! Call with any concerns. Continue to monitor. Will get her back into GI. Likely needs to have another paracentesis. Await their input.       Relevant Orders   Comprehensive metabolic panel (Completed)   Lipase   Amylase   Ambulatory referral to Gastroenterology   Other Visit Diagnoses       Leukocytosis, unspecified type       Rechecking labs today. Await results.   Relevant Orders   CBC with Differential/Platelet (Completed)     Anemia, unspecified type       Rechecking labs today. Await results.        Follow up plan: Return in about 3 weeks (around 05/29/2023) for follow up and pap.  >25 minutes spent with patient today.

## 2023-05-09 LAB — COMPREHENSIVE METABOLIC PANEL
ALT: 23 [IU]/L (ref 0–32)
AST: 79 [IU]/L — ABNORMAL HIGH (ref 0–40)
Albumin: 3.9 g/dL (ref 3.9–4.9)
Alkaline Phosphatase: 324 [IU]/L — ABNORMAL HIGH (ref 44–121)
BUN/Creatinine Ratio: 9 (ref 9–23)
BUN: 16 mg/dL (ref 6–20)
Bilirubin Total: 0.9 mg/dL (ref 0.0–1.2)
CO2: 20 mmol/L (ref 20–29)
Calcium: 10.3 mg/dL — ABNORMAL HIGH (ref 8.7–10.2)
Chloride: 93 mmol/L — ABNORMAL LOW (ref 96–106)
Creatinine, Ser: 1.82 mg/dL — ABNORMAL HIGH (ref 0.57–1.00)
Globulin, Total: 5 g/dL — ABNORMAL HIGH (ref 1.5–4.5)
Glucose: 110 mg/dL — ABNORMAL HIGH (ref 70–99)
Potassium: 3.8 mmol/L (ref 3.5–5.2)
Sodium: 140 mmol/L (ref 134–144)
Total Protein: 8.9 g/dL — ABNORMAL HIGH (ref 6.0–8.5)
eGFR: 36 mL/min/{1.73_m2} — ABNORMAL LOW (ref >59)

## 2023-05-09 LAB — CBC WITH DIFFERENTIAL/PLATELET
Basophils Absolute: 0.1 10*3/uL (ref 0.0–0.2)
Basos: 1 %
EOS (ABSOLUTE): 0.3 10*3/uL (ref 0.0–0.4)
Eos: 3 %
Hematocrit: 38.6 % (ref 34.0–46.6)
Hemoglobin: 13 g/dL (ref 11.1–15.9)
Immature Grans (Abs): 0 10*3/uL (ref 0.0–0.1)
Immature Granulocytes: 0 %
Lymphocytes Absolute: 1.9 10*3/uL (ref 0.7–3.1)
Lymphs: 19 %
MCH: 32.5 pg (ref 26.6–33.0)
MCHC: 33.7 g/dL (ref 31.5–35.7)
MCV: 97 fL (ref 79–97)
Monocytes Absolute: 0.7 10*3/uL (ref 0.1–0.9)
Monocytes: 7 %
Neutrophils Absolute: 7 10*3/uL (ref 1.4–7.0)
Neutrophils: 70 %
Platelets: 417 10*3/uL (ref 150–450)
RBC: 4 x10E6/uL (ref 3.77–5.28)
RDW: 13.7 % (ref 11.7–15.4)
WBC: 10 10*3/uL (ref 3.4–10.8)

## 2023-05-14 ENCOUNTER — Encounter: Payer: Self-pay | Admitting: Family Medicine

## 2023-05-14 DIAGNOSIS — G4733 Obstructive sleep apnea (adult) (pediatric): Secondary | ICD-10-CM | POA: Diagnosis not present

## 2023-05-14 NOTE — Assessment & Plan Note (Signed)
Maintain abstinence from alcohol. Congratulated patient on 27 days without a drink! Call with any concerns. Continue to monitor. Will get her back into GI. Likely needs to have another paracentesis. Await their input.

## 2023-05-14 NOTE — Assessment & Plan Note (Signed)
 Rechecking labs today. Await results.

## 2023-05-20 ENCOUNTER — Encounter: Payer: Self-pay | Admitting: Family Medicine

## 2023-05-22 ENCOUNTER — Other Ambulatory Visit: Payer: Self-pay | Admitting: Family Medicine

## 2023-05-23 NOTE — Telephone Encounter (Signed)
Requested Prescriptions  Pending Prescriptions Disp Refills   lactulose (CHRONULAC) 10 GM/15ML solution [Pharmacy Med Name: LACTULOSE 10GM/15ML SOLUTION] 236 mL 0    Sig: TAKE 15 MLS BY MOUTH DAILY     Gastroenterology:  Laxatives - lactulose Failed - 05/23/2023 11:18 AM      Failed - Cl in normal range and within 360 days    Chloride  Date Value Ref Range Status  05/08/2023 93 (L) 96 - 106 mmol/L Final         Passed - CO2 in normal range and within 360 days    CO2  Date Value Ref Range Status  05/08/2023 20 20 - 29 mmol/L Final   Bicarbonate  Date Value Ref Range Status  04/12/2023 28.2 (H) 20.0 - 28.0 mmol/L Final         Passed - K in normal range and within 360 days    Potassium  Date Value Ref Range Status  05/08/2023 3.8 3.5 - 5.2 mmol/L Final         Passed - Na in normal range and within 360 days    Sodium  Date Value Ref Range Status  05/08/2023 140 134 - 144 mmol/L Final         Passed - Valid encounter within last 12 months    Recent Outpatient Visits           2 weeks ago Alcoholic hepatitis with ascites   [facility] [place], Megan P, DO   2 months ago Cellulitis of left arm   [facility] [place], Megan P, DO   3 months ago Hepatic steatosis   [facility] [place], Megan P, DO   3 months ago AKI (acute kidney injury) [facility])   [facility] [place], Megan P, DO   3 months ago Routine general medical examination at a health care facility   [facility] [place], Oralia Rud, DO       Future Appointments             In 1 week Dorcas Carrow, DO [facility]

## 2023-05-31 ENCOUNTER — Ambulatory Visit: Payer: Self-pay | Admitting: Family Medicine

## 2023-06-05 ENCOUNTER — Other Ambulatory Visit: Payer: Self-pay

## 2023-06-05 ENCOUNTER — Ambulatory Visit (INDEPENDENT_AMBULATORY_CARE_PROVIDER_SITE_OTHER): Payer: Medicaid Other | Admitting: Gastroenterology

## 2023-06-05 ENCOUNTER — Encounter: Payer: Self-pay | Admitting: Gastroenterology

## 2023-06-05 VITALS — BP 124/79 | HR 90 | Temp 98.2°F | Ht 63.0 in | Wt 167.2 lb

## 2023-06-05 DIAGNOSIS — K709 Alcoholic liver disease, unspecified: Secondary | ICD-10-CM

## 2023-06-05 DIAGNOSIS — F10288 Alcohol dependence with other alcohol-induced disorder: Secondary | ICD-10-CM

## 2023-06-05 DIAGNOSIS — F1020 Alcohol dependence, uncomplicated: Secondary | ICD-10-CM | POA: Diagnosis not present

## 2023-06-05 DIAGNOSIS — K7011 Alcoholic hepatitis with ascites: Secondary | ICD-10-CM

## 2023-06-05 DIAGNOSIS — Z8719 Personal history of other diseases of the digestive system: Secondary | ICD-10-CM

## 2023-06-05 MED ORDER — GABAPENTIN 300 MG PO CAPS: 300.0000 mg | ORAL_CAPSULE | Freq: Every day | ORAL | 1 refills | Status: DC

## 2023-06-05 NOTE — Progress Notes (Signed)
 [name], MD [address]  [address]  [place], [place] [address]  Main: [phone]  Fax: [phone]    Gastroenterology Consultation  Referring Provider:     Dorcas Carrow, DO Primary Care Physician:  Dorcas Carrow, DO Primary Gastroenterologist:  Dr. [name] Reason for Consultation: Alcoholic liver disease        HPI:   Katie Stanley is a 38 y.o. female referred by Dr. Laural Benes, Oralia Rud, DO  for consultation & management of alcoholic liver disease with ascites.  Patient is known history of alcoholic liver disease, with multiple hospitalizations in the past.  Most recently in first week of January when she presented with abdominal distention secondary to new onset of ascites as well as diarrhea.  There was no evidence of infection, empirically treated with antibiotics.  Underwent therapeutic paracentesis, fluid analysis consistent with portal hypertension.  She was discharged on Lasix and spironolactone.  Patient reports that for 1 month that she did not drink any amount of alcohol and felt significantly better.  Currently, she drinks about 2-3 shots about once or twice a week.  She denies any recurrence of abdominal distention.  She continues to take Lasix spironolactone.  She reports mild epigastric discomfort.  Taking Protonix 40 mg twice daily as well as Pepcid.  She denies any rectal bleeding, black stools, nausea or vomiting CBC on 05/08/2023 was normal.  CMP revealed mildly elevated creatinine, AP 324, AST 79, T. bili 0.9, normal ALT  NSAIDs: None  Antiplts/Anticoagulants/Anti thrombotics: None  GI Procedures:  Colonoscopy 09/20/2022 - One 4 mm polyp in the transverse colon, removed with a cold biopsy forceps. Resected and retrieved. - Non- bleeding internal hemorrhoids. - Biopsies were taken with a cold forceps for histology in the entire colon. - Random biopsies were obtained. Tubular adenoma Hyperplastic polyp   Upper endoscopy 09/29/2021 -  Normal esophagus. - Gastritis. Biopsied. - Normal examined duodenum.   DIAGNOSIS:  A. 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  - NEGATIVE FOR H. PYLORI BY IMMUNOHISTOCHEMISTRY ([facility]).  - NEGATIVE FOR INTESTINAL METAPLASIA, DYSPLASIA, AND MALIGNANCY.  Past Medical History:  Diagnosis Date   Alcohol abuse    Anxiety    Biliary calculi 12/07/2009   Cholelithiasis    Depression    Diet-controlled diabetes mellitus (HCC)    GERD (gastroesophageal reflux disease)    Hepatitis C    body "self healed" per patient   LVH (left ventricular hypertrophy)    a. 10/2022 Echo: EF 60-65%, no rwma, sev conc LVH, nl RV size/fxn, triv MR. Mobile density near ant MV leaflet- felt to be redundant chordae tendinae..   Morbid obesity (HCC)    Palpitations    a. 07/2019 Holter: Sinus rhythm, PVCs, rare PACs.  No significant arrhythmias.   Pancreatic pseudocyst    a. 02/2023 MRI abd: improving unilocular pancreatic pseudocyst.   Retained intrauterine contraceptive device (IUD) 01/20/2020   Substance abuse (HCC)     Past Surgical History:  Procedure Laterality Date   CHOLECYSTECTOMY     2011   COLONOSCOPY WITH PROPOFOL N/A 09/20/2022   Procedure: COLONOSCOPY WITH PROPOFOL;  Surgeon: Midge Minium, MD;  Location: [facility] ENDOSCOPY;  Service: Endoscopy;  Laterality: N/A;   ESOPHAGOGASTRODUODENOSCOPY N/A 09/29/2021   Procedure: ESOPHAGOGASTRODUODENOSCOPY (EGD);  Surgeon: Midge Minium, MD;  Location: [facility] ENDOSCOPY;  Service: Endoscopy;  Laterality: N/A;   ESOPHAGOGASTRODUODENOSCOPY (EGD) WITH PROPOFOL N/A 01/21/2018   Procedure: ESOPHAGOGASTRODUODENOSCOPY (EGD) WITH Biopsy;  Surgeon: Midge Minium, MD;  Location: [facility];  Service: Endoscopy;  Laterality: N/A;   HYSTEROSCOPY WITH D & C N/A 01/20/2020   Procedure: DILATATION AND CURETTAGE /HYSTEROSCOPY;  Surgeon: Conard Novak, MD;  Location: [facility] ORS;  Service: Gynecology;  Laterality: N/A;    INTRAUTERINE DEVICE (IUD) INSERTION N/A 01/20/2020   Procedure: INTRAUTERINE DEVICE (IUD) INSERTION MIRENA IUD;  Surgeon: Conard Novak, MD;  Location: [facility] ORS;  Service: Gynecology;  Laterality: N/A;   IUD REMOVAL N/A 01/20/2020   Procedure: INTRAUTERINE DEVICE (IUD) REMOVAL;  Surgeon: Conard Novak, MD;  Location: [facility] ORS;  Service: Gynecology;  Laterality: N/A;   PORT-A-CATH REMOVAL  02/08/2022   PORTA CATH INSERTION N/A 01/01/2019   Procedure: PORTA CATH INSERTION;  Surgeon: Annice Needy, MD;  Location: [facility] INVASIVE CV LAB;  Service: Cardiovascular;  Laterality: N/A;   VASCULAR SURGERY       Current Outpatient Medications:    acetaminophen (TYLENOL) 500 MG tablet, Take 1 tablet (500 mg total) by mouth 3 (three) times daily as needed for headache, fever or moderate pain (pain score 4-6)., Disp: 20 tablet, Rfl: 0   citalopram (CELEXA) 20 MG tablet, TAKE 1.5 TABLET(20 MG) BY MOUTH DAILY, Disp: 135 tablet, Rfl: 1   famotidine (PEPCID) 20 MG tablet, Take 20 mg by mouth 2 (two) times daily., Disp: , Rfl:    fluticasone furoate-vilanterol (BREO ELLIPTA) 200-25 MCG/ACT AEPB, Inhale 1 puff into the lungs daily., Disp: 30 each, Rfl: 1   furosemide (LASIX) 40 MG tablet, Take 1 tablet (40 mg total) by mouth 2 (two) times daily., Disp: 180 tablet, Rfl: 0   gabapentin (NEURONTIN) 300 MG capsule, Take 1 capsule (300 mg total) by mouth at bedtime. Increase to twice daily if able to tolerate without any drowsiness, Disp: 30 capsule, Rfl: 1   Iron, Ferrous Sulfate, 325 (65 Fe) MG TABS, Take 325 mg by mouth daily., Disp: 30 tablet, Rfl: 5   lactulose (CHRONULAC) 10 GM/15ML solution, TAKE 15 MLS BY MOUTH DAILY, Disp: 236 mL, Rfl: 0   MAGNESIUM PO, Take 1 each by mouth in the morning and at bedtime. OTC patient is unsure of the dost, Disp: , Rfl:    methocarbamol (ROBAXIN) 500 MG tablet, Take 1 tablet (500 mg total) by mouth 2 (two) times daily., Disp: 60 tablet, Rfl: 1   metoprolol succinate  (TOPROL-XL) 25 MG 24 hr tablet, Take 1 tablet (25 mg total) by mouth daily., Disp: 90 tablet, Rfl: 1   Multiple Vitamin (MULTIVITAMIN WITH MINERALS) TABS tablet, Take 1 tablet by mouth daily., Disp: 30 tablet, Rfl: 0   pantoprazole (PROTONIX) 40 MG tablet, Take 1 tablet (40 mg total) by mouth 2 (two) times daily before a meal., Disp: 180 tablet, Rfl: 0   potassium chloride SA (KLOR-CON M) 20 MEQ tablet, TAKE 1 TABLET(20 MEQ) BY MOUTH DAILY, Disp: 22 tablet, Rfl: 0   folic acid (FOLVITE) 1 MG tablet, Take 1 tablet (1 mg total) by mouth daily. (Patient not taking: Reported on 06/05/2023), Disp: 30 tablet, Rfl: 0   lidocaine (LIDODERM) 5 %, Place 1 patch onto the skin daily. Remove & Discard patch within 12 hours or as directed by MD (Patient not taking: Reported on 06/05/2023), Disp: 30 patch, Rfl: 0   spironolactone (ALDACTONE) 50 MG tablet, Take 1 tablet (50 mg total) by mouth daily. (Patient not taking: Reported on 06/05/2023), Disp: 90 tablet, Rfl: 0   Family History  Problem Relation Age of Onset   Hypertension  Mother    Diabetes Father    Hypertension Father    Liver cancer Maternal Grandmother    Hypertension Maternal Grandfather    Breast cancer Paternal Grandmother    Heart disease Paternal Grandfather      Social History   Tobacco Use   Smoking status: Every Day    Current packs/day: 1.00    Average packs/day: 1 pack/day for 12.0 years (12.0 ttl pk-yrs)    Types: Cigarettes   Smokeless tobacco: Never  Vaping Use   Vaping status: Never Used  Substance Use Topics   Alcohol use: Yes    Comment: less than a 5th a day. 06/05/23 states she is trying to stop drinking has a couple slip ups since 04/08/2024 but cut back a lot   Drug use: Yes    Types: Marijuana    Comment: 09/28/2021    Allergies as of 06/05/2023 - Review Complete 06/05/2023  Allergen Reaction Noted   Penicillins Swelling 12/19/2014    Review of Systems:    All systems reviewed and negative except where noted  in HPI.   Physical Exam:  BP 124/79 (BP Location: Left Arm, Patient Position: Sitting, Cuff Size: Normal)   Pulse 90   Temp 98.2 F (36.8 C) (Oral)   Ht 5\' 3"  (1.6 m)   Wt 167 lb 4 oz (75.9 kg)   BMI 29.63 kg/m  No LMP recorded. (Menstrual status: IUD).  General:   Alert,  Well-developed, well-nourished, pleasant and cooperative in NAD Head:  Normocephalic and atraumatic. Eyes:  Sclera clear, no icterus.   Conjunctiva pink. Ears:  Normal auditory acuity. Nose:  No deformity, discharge, or lesions. Mouth:  No deformity or lesions,oropharynx pink & moist. Neck:  Supple; no masses or thyromegaly. Lungs:  Respirations even and unlabored.  Clear throughout to auscultation.   No wheezes, crackles, or rhonchi. No acute distress. Heart:  Regular rate and rhythm; no murmurs, clicks, rubs, or gallops. Abdomen:  Normal bowel sounds. Soft, non-tender and non-distended without masses, hepatosplenomegaly or hernias noted.  No guarding or rebound tenderness.   Rectal: Not performed Msk:  Symmetrical without gross deformities. Good, equal movement & strength bilaterally. Pulses:  Normal pulses noted. Extremities:  No clubbing or edema.  No cyanosis. Neurologic:  Alert and oriented x3;  grossly normal neurologically. Skin:  Intact without significant lesions or rashes. No jaundice. Psych:  Alert and cooperative. Normal mood and affect.  Imaging Studies: Reviewed  Assessment and Plan:   Katie Stanley is a 38 y.o. pleasant Caucasian female with history of alcohol dependence, alcoholic liver disease, depression  Chronic alcoholic liver disease with no evidence of acute alcoholic hepatitis History of ascites in 03/2023 s/p therapeutic paracentesis and fluid analysis consistent with portal hypertension No imaging evidence of nodularity of liver Recommend rest of the secondary liver disease workup Continue current dose of diuretics Maintain low-sodium diet Check BMP, mag and Phos Recommended  to start low-dose gabapentin 300 mg at bedtime and increase to twice daily if able to tolerate without lethargy or drowsiness  Check serum vitamin D levels  Follow up in 2 to 3 months   [name], MD

## 2023-06-06 ENCOUNTER — Telehealth: Payer: Self-pay

## 2023-06-06 NOTE — Telephone Encounter (Signed)
 Saw that they did not have enough blood to do the Magnesium phosphorus or BMP to the lab work yesterday. Called patient and patient will go to the [facility] drawing station to have her labs drawn again so she can get these done

## 2023-06-07 ENCOUNTER — Ambulatory Visit: Payer: Medicaid Other | Admitting: Family Medicine

## 2023-06-07 ENCOUNTER — Encounter: Payer: Self-pay | Admitting: Family Medicine

## 2023-06-07 VITALS — BP 99/65 | HR 66 | Temp 98.4°F | Resp 17 | Ht 63.0 in | Wt 170.0 lb

## 2023-06-07 DIAGNOSIS — R2232 Localized swelling, mass and lump, left upper limb: Secondary | ICD-10-CM | POA: Diagnosis not present

## 2023-06-07 DIAGNOSIS — I83813 Varicose veins of bilateral lower extremities with pain: Secondary | ICD-10-CM

## 2023-06-07 DIAGNOSIS — N63 Unspecified lump in unspecified breast: Secondary | ICD-10-CM | POA: Diagnosis not present

## 2023-06-07 DIAGNOSIS — Z0000 Encounter for general adult medical examination without abnormal findings: Secondary | ICD-10-CM

## 2023-06-07 NOTE — Progress Notes (Signed)
 BP 99/65 (BP Location: Left Arm, Patient Position: Sitting, Cuff Size: Normal)   Pulse 66   Temp 98.4 F (36.9 C) (Oral)   Resp 17   Ht 5\' 3"  (1.6 m)   Wt 170 lb (77.1 kg)   LMP  (LMP Unknown)   SpO2 97%   BMI 30.11 kg/m    Subjective:    Patient ID: Katie Stanley, female    DOB: [birth year], 38 y.o.   MRN: [id number]  HPI: Katie Stanley is a 38 y.o. female presenting on 06/07/2023 for comprehensive medical examination. Current medical complaints include: Has been feeling well. Considering going to AA. She notes that she's had a few slip ups but has been drinking significantly less. She is feeling better.   She currently lives with: fiance and son Menopausal Symptoms: no  Depression Screen done today and results listed below:     06/07/2023    4:31 PM 05/08/2023    1:40 PM 02/26/2023    4:01 PM 02/20/2023    2:32 PM 02/09/2023    2:00 PM  Depression screen PHQ 2/9  Decreased Interest 0 1 0 0 1  Down, Depressed, Hopeless 0 1 0 0 0  PHQ - 2 Score 0 2 0 0 1  Altered sleeping 1 0 0 0 3  Tired, decreased energy 1 2 0 1 2  Change in appetite 0 2 1 0 2  Feeling bad or failure about yourself  1 1 0 1 0  Trouble concentrating 0 0 0 0 0  Moving slowly or fidgety/restless 0 0 0 0 0  Suicidal thoughts 0 0 0 0 0  PHQ-9 Score 3 7 1 2 8   Difficult doing work/chores Somewhat difficult Somewhat difficult Not difficult at all Not difficult at all Somewhat difficult    Past Medical History:  Past Medical History:  Diagnosis Date   Alcohol abuse    Anxiety    Biliary calculi 12/07/2009   Cholelithiasis    Depression    Diet-controlled diabetes mellitus (HCC)    GERD (gastroesophageal reflux disease)    Hepatitis C    body "self healed" per patient   LVH (left ventricular hypertrophy)    a. 10/2022 Echo: EF 60-65%, no rwma, sev conc LVH, nl RV size/fxn, triv MR. Mobile density near ant MV leaflet- felt to be redundant chordae tendinae..   Morbid obesity (HCC)    Palpitations     a. 07/2019 Holter: Sinus rhythm, PVCs, rare PACs.  No significant arrhythmias.   Pancreatic pseudocyst    a. 02/2023 MRI abd: improving unilocular pancreatic pseudocyst.   Retained intrauterine contraceptive device (IUD) 01/20/2020   Substance abuse (HCC)     Surgical History:  Past Surgical History:  Procedure Laterality Date   CHOLECYSTECTOMY     2011   COLONOSCOPY WITH PROPOFOL N/A 09/20/2022   Procedure: COLONOSCOPY WITH PROPOFOL;  Surgeon: Midge Minium, MD;  Location: [facility] ENDOSCOPY;  Service: Endoscopy;  Laterality: N/A;   ESOPHAGOGASTRODUODENOSCOPY N/A 09/29/2021   Procedure: ESOPHAGOGASTRODUODENOSCOPY (EGD);  Surgeon: Midge Minium, MD;  Location: [facility] ENDOSCOPY;  Service: Endoscopy;  Laterality: N/A;   ESOPHAGOGASTRODUODENOSCOPY (EGD) WITH PROPOFOL N/A 01/21/2018   Procedure: ESOPHAGOGASTRODUODENOSCOPY (EGD) WITH Biopsy;  Surgeon: Midge Minium, MD;  Location: [facility];  Service: Endoscopy;  Laterality: N/A;   HYSTEROSCOPY WITH D & C N/A 01/20/2020   Procedure: DILATATION AND CURETTAGE /HYSTEROSCOPY;  Surgeon: Conard Novak, MD;  Location: [facility] ORS;  Service: Gynecology;  Laterality: N/A;   INTRAUTERINE DEVICE (  IUD) INSERTION N/A 01/20/2020   Procedure: INTRAUTERINE DEVICE (IUD) INSERTION MIRENA IUD;  Surgeon: Conard Novak, MD;  Location: [facility] ORS;  Service: Gynecology;  Laterality: N/A;   IUD REMOVAL N/A 01/20/2020   Procedure: INTRAUTERINE DEVICE (IUD) REMOVAL;  Surgeon: Conard Novak, MD;  Location: [facility] ORS;  Service: Gynecology;  Laterality: N/A;   PORT-A-CATH REMOVAL  02/08/2022   PORTA CATH INSERTION N/A 01/01/2019   Procedure: PORTA CATH INSERTION;  Surgeon: Annice Needy, MD;  Location: [facility] INVASIVE CV LAB;  Service: Cardiovascular;  Laterality: N/A;   VASCULAR SURGERY      Medications:  No current facility-administered medications on file prior to visit.   Current Outpatient Medications on File Prior to Visit  Medication Sig    acetaminophen (TYLENOL) 500 MG tablet Take 1 tablet (500 mg total) by mouth 3 (three) times daily as needed for headache, fever or moderate pain (pain score 4-6).   citalopram (CELEXA) 20 MG tablet TAKE 1.5 TABLET(20 MG) BY MOUTH DAILY   famotidine (PEPCID) 20 MG tablet Take 20 mg by mouth 2 (two) times daily.   fluticasone furoate-vilanterol (BREO ELLIPTA) 200-25 MCG/ACT AEPB Inhale 1 puff into the lungs daily.   folic acid (FOLVITE) 1 MG tablet Take 1 tablet (1 mg total) by mouth daily. (Patient not taking: Reported on 06/10/2023)   furosemide (LASIX) 40 MG tablet Take 1 tablet (40 mg total) by mouth 2 (two) times daily.   gabapentin (NEURONTIN) 300 MG capsule Take 1 capsule (300 mg total) by mouth at bedtime. Increase to twice daily if able to tolerate without any drowsiness   Iron, Ferrous Sulfate, 325 (65 Fe) MG TABS Take 325 mg by mouth daily.   lactulose (CHRONULAC) 10 GM/15ML solution TAKE 15 MLS BY MOUTH DAILY   lidocaine (LIDODERM) 5 % Place 1 patch onto the skin daily. Remove & Discard patch within 12 hours or as directed by MD   MAGNESIUM PO Take 1 each by mouth in the morning and at bedtime. OTC patient is unsure of the dost   methocarbamol (ROBAXIN) 500 MG tablet Take 1 tablet (500 mg total) by mouth 2 (two) times daily.   metoprolol succinate (TOPROL-XL) 25 MG 24 hr tablet Take 1 tablet (25 mg total) by mouth daily.   Multiple Vitamin (MULTIVITAMIN WITH MINERALS) TABS tablet Take 1 tablet by mouth daily.   pantoprazole (PROTONIX) 40 MG tablet Take 1 tablet (40 mg total) by mouth 2 (two) times daily before a meal.   potassium chloride SA (KLOR-CON M) 20 MEQ tablet TAKE 1 TABLET(20 MEQ) BY MOUTH DAILY   spironolactone (ALDACTONE) 50 MG tablet Take 1 tablet (50 mg total) by mouth daily. (Patient not taking: Reported on 06/07/2023)    Allergies:  Allergies  Allergen Reactions   Penicillins Swelling    Tolerated ceftriaxone and cefepime in past per [facility] and [facility] records    Social  History:  Social History   Socioeconomic History   Marital status: Single    Spouse name: Not on file   Number of children: Not on file   Years of education: Not on file   Highest education level: Not on file  Occupational History   Not on file  Tobacco Use   Smoking status: Every Day    Current packs/day: 1.00    Average packs/day: 1 pack/day for 12.0 years (12.0 ttl pk-yrs)    Types: Cigarettes   Smokeless tobacco: Never  Vaping Use   Vaping status: Never Used  Substance and  Sexual Activity   Alcohol use: Yes    Comment: less than a 5th a day. 06/05/23 states she is trying to stop drinking has a couple slip ups since 04/08/2024 but cut back a lot   Drug use: Yes    Types: Marijuana    Comment: 09/28/2021   Sexual activity: Yes    Birth control/protection: Implant, I.U.D.  Other Topics Concern   Not on file  Social History Narrative   Not on file   Social Drivers of Health   Financial Resource Strain: Low Risk  (02/26/2023)   Overall Financial Resource Strain (CARDIA)    Difficulty of Paying Living Expenses: Not hard at all  Food Insecurity: No Food Insecurity (04/17/2023)   Hunger Vital Sign    Worried About Running Out of Food in the Last Year: Never true    Ran Out of Food in the Last Year: Never true  Transportation Needs: Unknown (04/17/2023)   PRAPARE - Transportation    Lack of Transportation (Medical): No    Lack of Transportation (Non-Medical): Patient declined  Physical Activity: Insufficiently Active (02/26/2023)   Exercise Vital Sign    Days of Exercise per Week: 2 days    Minutes of Exercise per Session: 40 min  Stress: No Stress Concern Present (02/26/2023)   [employer] of Occupational Health - Occupational Stress Questionnaire    Feeling of Stress : Not at all  Social Connections: Moderately Isolated (04/17/2023)   Social Connection and Isolation Panel [NHANES]    Frequency of Communication with Friends and Family: More than three times a week     Frequency of Social Gatherings with Friends and Family: Three times a week    Attends Religious Services: Never    Active Member of Clubs or Organizations: No    Attends Banker Meetings: Never    Marital Status: Living with partner  Intimate Partner Violence: Not At Risk (04/17/2023)   Humiliation, Afraid, Rape, and Kick questionnaire    Fear of Current or Ex-Partner: No    Emotionally Abused: No    Physically Abused: No    Sexually Abused: No   Social History   Tobacco Use  Smoking Status Every Day   Current packs/day: 1.00   Average packs/day: 1 pack/day for 12.0 years (12.0 ttl pk-yrs)   Types: Cigarettes  Smokeless Tobacco Never   Social History   Substance and Sexual Activity  Alcohol Use Yes   Comment: less than a 5th a day. 06/05/23 states she is trying to stop drinking has a couple slip ups since 04/08/2024 but cut back a lot    Family History:  Family History  Problem Relation Age of Onset   Hypertension Mother    Diabetes Father    Hypertension Father    Liver cancer Maternal Grandmother    Hypertension Maternal Grandfather    Breast cancer Paternal Grandmother    Heart disease Paternal Grandfather     Past medical history, surgical history, medications, allergies, family history and social history reviewed with patient today and changes made to appropriate areas of the chart.   Review of Systems  Constitutional: Negative.   HENT:  Positive for congestion. Negative for ear discharge, ear pain, hearing loss, nosebleeds, sinus pain, sore throat and tinnitus.   Eyes: Negative.   Respiratory: Negative.  Negative for stridor.   Cardiovascular: Negative.   Gastrointestinal:  Positive for diarrhea and vomiting. Negative for abdominal pain, blood in stool, constipation, heartburn, melena and nausea.  Genitourinary: Negative.  Musculoskeletal: Negative.   Skin: Negative.   Neurological: Negative.   Endo/Heme/Allergies: Negative.    Psychiatric/Behavioral: Negative.     All other ROS negative except what is listed above and in the HPI.      Objective:    BP 99/65 (BP Location: Left Arm, Patient Position: Sitting, Cuff Size: Normal)   Pulse 66   Temp 98.4 F (36.9 C) (Oral)   Resp 17   Ht 5\' 3"  (1.6 m)   Wt 170 lb (77.1 kg)   LMP  (LMP Unknown)   SpO2 97%   BMI 30.11 kg/m   Wt Readings from Last 3 Encounters:  06/11/23 167 lb 12.3 oz (76.1 kg)  06/07/23 170 lb (77.1 kg)  06/05/23 167 lb 4 oz (75.9 kg)    Physical Exam Vitals and nursing note reviewed. Exam conducted with a chaperone present.  Constitutional:      General: She is not in acute distress.    Appearance: Normal appearance. She is not ill-appearing, toxic-appearing or diaphoretic.  HENT:     Head: Normocephalic and atraumatic.     Right Ear: Tympanic membrane, ear canal and external ear normal. There is no impacted cerumen.     Left Ear: Tympanic membrane, ear canal and external ear normal. There is no impacted cerumen.     Nose: Nose normal. No congestion or rhinorrhea.     Mouth/Throat:     Mouth: Mucous membranes are moist.     Pharynx: Oropharynx is clear. No oropharyngeal exudate or posterior oropharyngeal erythema.  Eyes:     General: No scleral icterus.       Right eye: No discharge.        Left eye: No discharge.     Extraocular Movements: Extraocular movements intact.     Conjunctiva/sclera: Conjunctivae normal.     Pupils: Pupils are equal, round, and reactive to light.  Neck:     Vascular: No carotid bruit.  Cardiovascular:     Rate and Rhythm: Normal rate and regular rhythm.     Pulses: Normal pulses.     Heart sounds: No murmur heard.    No friction rub. No gallop.  Pulmonary:     Effort: Pulmonary effort is normal. No respiratory distress.     Breath sounds: Normal breath sounds. No stridor. No wheezing, rhonchi or rales.  Chest:     Chest wall: No tenderness.  Breasts:    Right: Normal.     Left: Normal.   Abdominal:     General: Abdomen is flat. Bowel sounds are normal. There is no distension.     Palpations: Abdomen is soft. There is no mass.     Tenderness: There is no abdominal tenderness. There is no right CVA tenderness, left CVA tenderness, guarding or rebound.     Hernia: No hernia is present.  Genitourinary:    Labia:        Right: No rash, tenderness, lesion or injury.        Left: No rash, tenderness, lesion or injury.      Vagina: Normal.     Cervix: Normal.     Uterus: Normal.      Adnexa: Right adnexa normal and left adnexa normal.  Musculoskeletal:        General: No swelling, tenderness, deformity or signs of injury.     Cervical back: Normal range of motion and neck supple. No rigidity. No muscular tenderness.     Right lower leg: No edema.  Left lower leg: No edema.  Lymphadenopathy:     Cervical: No cervical adenopathy.  Skin:    General: Skin is warm and dry.     Capillary Refill: Capillary refill takes less than 2 seconds.     Coloration: Skin is not jaundiced or pale.     Findings: No bruising, erythema, lesion or rash.  Neurological:     General: No focal deficit present.     Mental Status: She is alert and oriented to person, place, and time. Mental status is at baseline.     Cranial Nerves: No cranial nerve deficit.     Sensory: No sensory deficit.     Motor: No weakness.     Coordination: Coordination normal.     Gait: Gait normal.     Deep Tendon Reflexes: Reflexes normal.  Psychiatric:        Mood and Affect: Mood normal.        Behavior: Behavior normal.        Thought Content: Thought content normal.        Judgment: Judgment normal.     Results for orders placed or performed in visit on 06/05/23  VITAMIN D 25 Hydroxy (Vit-D Deficiency, Fractures)   Collection Time: 06/05/23  3:27 PM  Result Value Ref Range   Vit D, 25-Hydroxy 34.8 30.0 - 100.0 ng/mL  Alpha-1-antitrypsin   Collection Time: 06/05/23  3:27 PM  Result Value Ref Range    A-1 Antitrypsin 211 (H) 100 - 188 mg/dL  ANA   Collection Time: 06/05/23  3:27 PM  Result Value Ref Range   ANA Titer 1 WILL FOLLOW    Please Note: Comment   Anti-smooth muscle antibody, IgG   Collection Time: 06/05/23  3:27 PM  Result Value Ref Range   Smooth Muscle Ab 22 (H) 0 - 19 Units  Ceruloplasmin   Collection Time: 06/05/23  3:27 PM  Result Value Ref Range   Ceruloplasmin 38.2 19.0 - 39.0 mg/dL  Mitochondrial antibodies   Collection Time: 06/05/23  3:27 PM  Result Value Ref Range   Mitochondrial Ab <20.0 0.0 - 20.0 Units      Assessment & Plan:   Problem List Items Addressed This Visit   None Visit Diagnoses       Routine general medical examination at a health care facility    -  Primary   Vaccines up to date/declined. Screening labs checked today. Pap done. Mammo ordered given symptoms. Continue diet and exercise. Call with concerns.   Relevant Orders   CBC with Differential/Platelet   Comprehensive metabolic panel   Lipid Panel w/o Chol/HDL Ratio   TSH   Magnesium   Phosphorus   Cytology - PAP     Varicose veins of both lower extremities with pain       Referral to vascular placed today.   Relevant Orders   Ambulatory referral to Vascular Surgery     Mass of left axilla       Mammo and [place] ordered. Await results.   Relevant Orders   MM 3D DIAGNOSTIC MAMMOGRAM BILATERAL BREAST   US LIMITED ULTRASOUND INCLUDING AXILLA LEFT BREAST    [place] LIMITED ULTRASOUND INCLUDING AXILLA RIGHT BREAST     Breast swelling       Mammo and [place] ordered. Await results.   Relevant Orders   MM 3D DIAGNOSTIC MAMMOGRAM BILATERAL BREAST   US LIMITED ULTRASOUND INCLUDING AXILLA LEFT BREAST    [place] LIMITED ULTRASOUND INCLUDING AXILLA RIGHT BREAST  Follow up plan: No follow-ups on file.   LABORATORY TESTING:  - Pap smear: pap done  IMMUNIZATIONS:   - Tdap: Tetanus vaccination status reviewed: last tetanus booster within 10 years. - Influenza: Refused - Pneumovax: Up to  date - Prevnar: Refused - COVID: Refused - HPV: Not applicable - Shingrix vaccine: Not applicable  SCREENING: -Mammogram: Ordered today   PATIENT COUNSELING:   Advised to take 1 mg of folate supplement per day if capable of pregnancy.   Sexuality: Discussed sexually transmitted diseases, partner selection, use of condoms, avoidance of unintended pregnancy  and contraceptive alternatives.   Advised to avoid cigarette smoking.  I discussed with the patient that most people either abstain from alcohol or drink within safe limits (<=14/week and <=4 drinks/occasion for males, <=7/weeks and <= 3 drinks/occasion for females) and that the risk for alcohol disorders and other health effects rises proportionally with the number of drinks per week and how often a drinker exceeds daily limits.  Discussed cessation/primary prevention of drug use and availability of treatment for abuse.   Diet: Encouraged to adjust caloric intake to maintain  or achieve ideal body weight, to reduce intake of dietary saturated fat and total fat, to limit sodium intake by avoiding high sodium foods and not adding table salt, and to maintain adequate dietary potassium and calcium preferably from fresh fruits, vegetables, and low-fat dairy products.    stressed the importance of regular exercise  Injury prevention: Discussed safety belts, safety helmets, smoke detector, smoking near bedding or upholstery.   Dental health: Discussed importance of regular tooth brushing, flossing, and dental visits.    NEXT PREVENTATIVE PHYSICAL DUE IN 1 YEAR. No follow-ups on file.

## 2023-06-08 ENCOUNTER — Telehealth: Payer: Self-pay

## 2023-06-10 ENCOUNTER — Emergency Department

## 2023-06-10 ENCOUNTER — Other Ambulatory Visit: Payer: Self-pay

## 2023-06-10 ENCOUNTER — Inpatient Hospital Stay

## 2023-06-10 DIAGNOSIS — J189 Pneumonia, unspecified organism: Secondary | ICD-10-CM | POA: Diagnosis present

## 2023-06-10 DIAGNOSIS — Z515 Encounter for palliative care: Secondary | ICD-10-CM

## 2023-06-10 DIAGNOSIS — N179 Acute kidney failure, unspecified: Secondary | ICD-10-CM | POA: Diagnosis not present

## 2023-06-10 DIAGNOSIS — I462 Cardiac arrest due to underlying cardiac condition: Secondary | ICD-10-CM | POA: Diagnosis present

## 2023-06-10 DIAGNOSIS — J690 Pneumonitis due to inhalation of food and vomit: Secondary | ICD-10-CM | POA: Diagnosis present

## 2023-06-10 DIAGNOSIS — Z529 Donor of unspecified organ or tissue: Secondary | ICD-10-CM | POA: Diagnosis not present

## 2023-06-10 DIAGNOSIS — I429 Cardiomyopathy, unspecified: Secondary | ICD-10-CM | POA: Diagnosis present

## 2023-06-10 DIAGNOSIS — K7460 Unspecified cirrhosis of liver: Secondary | ICD-10-CM

## 2023-06-10 DIAGNOSIS — G40409 Other generalized epilepsy and epileptic syndromes, not intractable, without status epilepticus: Secondary | ICD-10-CM

## 2023-06-10 DIAGNOSIS — N182 Chronic kidney disease, stage 2 (mild): Secondary | ICD-10-CM | POA: Diagnosis present

## 2023-06-10 DIAGNOSIS — G40419 Other generalized epilepsy and epileptic syndromes, intractable, without status epilepticus: Secondary | ICD-10-CM | POA: Diagnosis not present

## 2023-06-10 DIAGNOSIS — I4901 Ventricular fibrillation: Secondary | ICD-10-CM | POA: Diagnosis present

## 2023-06-10 DIAGNOSIS — F4310 Post-traumatic stress disorder, unspecified: Secondary | ICD-10-CM | POA: Diagnosis present

## 2023-06-10 DIAGNOSIS — E876 Hypokalemia: Secondary | ICD-10-CM | POA: Diagnosis present

## 2023-06-10 DIAGNOSIS — E872 Acidosis, unspecified: Secondary | ICD-10-CM | POA: Diagnosis not present

## 2023-06-10 DIAGNOSIS — K7030 Alcoholic cirrhosis of liver without ascites: Secondary | ICD-10-CM | POA: Diagnosis present

## 2023-06-10 DIAGNOSIS — I5022 Chronic systolic (congestive) heart failure: Secondary | ICD-10-CM | POA: Diagnosis present

## 2023-06-10 DIAGNOSIS — G931 Anoxic brain damage, not elsewhere classified: Secondary | ICD-10-CM | POA: Diagnosis present

## 2023-06-10 DIAGNOSIS — Z6831 Body mass index (BMI) 31.0-31.9, adult: Secondary | ICD-10-CM | POA: Diagnosis not present

## 2023-06-10 DIAGNOSIS — N170 Acute kidney failure with tubular necrosis: Secondary | ICD-10-CM | POA: Diagnosis present

## 2023-06-10 DIAGNOSIS — I469 Cardiac arrest, cause unspecified: Secondary | ICD-10-CM

## 2023-06-10 DIAGNOSIS — Z7951 Long term (current) use of inhaled steroids: Secondary | ICD-10-CM

## 2023-06-10 DIAGNOSIS — A419 Sepsis, unspecified organism: Secondary | ICD-10-CM | POA: Diagnosis not present

## 2023-06-10 DIAGNOSIS — K7682 Hepatic encephalopathy: Secondary | ICD-10-CM | POA: Diagnosis present

## 2023-06-10 DIAGNOSIS — J9600 Acute respiratory failure, unspecified whether with hypoxia or hypercapnia: Secondary | ICD-10-CM | POA: Diagnosis present

## 2023-06-10 DIAGNOSIS — T41295A Adverse effect of other general anesthetics, initial encounter: Secondary | ICD-10-CM | POA: Diagnosis not present

## 2023-06-10 DIAGNOSIS — I214 Non-ST elevation (NSTEMI) myocardial infarction: Secondary | ICD-10-CM | POA: Diagnosis present

## 2023-06-10 DIAGNOSIS — K219 Gastro-esophageal reflux disease without esophagitis: Secondary | ICD-10-CM | POA: Diagnosis present

## 2023-06-10 DIAGNOSIS — G928 Other toxic encephalopathy: Secondary | ICD-10-CM | POA: Diagnosis present

## 2023-06-10 DIAGNOSIS — F10130 Alcohol abuse with withdrawal, uncomplicated: Secondary | ICD-10-CM | POA: Diagnosis not present

## 2023-06-10 DIAGNOSIS — R6521 Severe sepsis with septic shock: Secondary | ICD-10-CM | POA: Diagnosis present

## 2023-06-10 DIAGNOSIS — F10932 Alcohol use, unspecified with withdrawal with perceptual disturbance: Principal | ICD-10-CM

## 2023-06-10 DIAGNOSIS — G40909 Epilepsy, unspecified, not intractable, without status epilepticus: Secondary | ICD-10-CM | POA: Diagnosis present

## 2023-06-10 DIAGNOSIS — Z800 Family history of malignant neoplasm of digestive organs: Secondary | ICD-10-CM

## 2023-06-10 DIAGNOSIS — R569 Unspecified convulsions: Principal | ICD-10-CM

## 2023-06-10 DIAGNOSIS — F32A Depression, unspecified: Secondary | ICD-10-CM | POA: Diagnosis present

## 2023-06-10 DIAGNOSIS — Z880 Allergy status to penicillin: Secondary | ICD-10-CM

## 2023-06-10 DIAGNOSIS — J9601 Acute respiratory failure with hypoxia: Secondary | ICD-10-CM | POA: Diagnosis not present

## 2023-06-10 DIAGNOSIS — Z833 Family history of diabetes mellitus: Secondary | ICD-10-CM

## 2023-06-10 DIAGNOSIS — R571 Hypovolemic shock: Secondary | ICD-10-CM | POA: Diagnosis present

## 2023-06-10 DIAGNOSIS — E861 Hypovolemia: Secondary | ICD-10-CM | POA: Diagnosis present

## 2023-06-10 DIAGNOSIS — Z803 Family history of malignant neoplasm of breast: Secondary | ICD-10-CM

## 2023-06-10 DIAGNOSIS — Z8249 Family history of ischemic heart disease and other diseases of the circulatory system: Secondary | ICD-10-CM

## 2023-06-10 DIAGNOSIS — I952 Hypotension due to drugs: Secondary | ICD-10-CM | POA: Diagnosis not present

## 2023-06-10 DIAGNOSIS — R570 Cardiogenic shock: Secondary | ICD-10-CM

## 2023-06-10 DIAGNOSIS — F10139 Alcohol abuse with withdrawal, unspecified: Secondary | ICD-10-CM | POA: Diagnosis present

## 2023-06-10 DIAGNOSIS — F419 Anxiety disorder, unspecified: Secondary | ICD-10-CM | POA: Diagnosis present

## 2023-06-10 DIAGNOSIS — Z79899 Other long term (current) drug therapy: Secondary | ICD-10-CM

## 2023-06-10 DIAGNOSIS — E1122 Type 2 diabetes mellitus with diabetic chronic kidney disease: Secondary | ICD-10-CM | POA: Diagnosis present

## 2023-06-10 DIAGNOSIS — G9341 Metabolic encephalopathy: Secondary | ICD-10-CM | POA: Diagnosis not present

## 2023-06-10 DIAGNOSIS — E119 Type 2 diabetes mellitus without complications: Secondary | ICD-10-CM | POA: Diagnosis not present

## 2023-06-10 DIAGNOSIS — F17210 Nicotine dependence, cigarettes, uncomplicated: Secondary | ICD-10-CM | POA: Diagnosis present

## 2023-06-10 DIAGNOSIS — Z7982 Long term (current) use of aspirin: Secondary | ICD-10-CM

## 2023-06-10 DIAGNOSIS — Z9049 Acquired absence of other specified parts of digestive tract: Secondary | ICD-10-CM

## 2023-06-10 DIAGNOSIS — Z7189 Other specified counseling: Secondary | ICD-10-CM | POA: Diagnosis not present

## 2023-06-10 DIAGNOSIS — B1920 Unspecified viral hepatitis C without hepatic coma: Secondary | ICD-10-CM | POA: Diagnosis present

## 2023-06-10 LAB — COOXEMETRY PANEL
Carboxyhemoglobin: 1.4 % (ref 0.5–1.5)
Methemoglobin: 1.1 % (ref 0.0–1.5)
O2 Saturation: 87.2 %
Total hemoglobin: 13.5 g/dL (ref 12.0–16.0)
Total oxygen content: 85 %

## 2023-06-10 LAB — URINALYSIS, W/ REFLEX TO CULTURE (INFECTION SUSPECTED)
Glucose, UA: 50 mg/dL — AB
Ketones, ur: NEGATIVE mg/dL
Leukocytes,Ua: NEGATIVE
Nitrite: NEGATIVE
Protein, ur: >=300 mg/dL — AB
Specific Gravity, Urine: 1.023 (ref 1.005–1.030)
WBC, UA: >50 WBC/hpf (ref 0–5)
pH: 5 (ref 5.0–8.0)

## 2023-06-10 LAB — CBC
HCT: 46.7 % — ABNORMAL HIGH (ref 36.0–46.0)
Hemoglobin: 15.2 g/dL — ABNORMAL HIGH (ref 12.0–15.0)
MCH: 32 pg (ref 26.0–34.0)
MCHC: 32.5 g/dL (ref 30.0–36.0)
MCV: 98.3 fL (ref 80.0–100.0)
Platelets: 236 10*3/uL (ref 150–400)
RBC: 4.75 MIL/uL (ref 3.87–5.11)
RDW: 16.9 % — ABNORMAL HIGH (ref 11.5–15.5)
WBC: 17.3 10*3/uL — ABNORMAL HIGH (ref 4.0–10.5)
nRBC: 0.1 % (ref 0.0–0.2)

## 2023-06-10 LAB — BLOOD GAS, ARTERIAL
Acid-base deficit: 7.9 mmol/L — ABNORMAL HIGH (ref 0.0–2.0)
Bicarbonate: 18.3 mmol/L — ABNORMAL LOW (ref 20.0–28.0)
Bicarbonate: 22.7 mmol/L (ref 20.0–28.0)
FIO2: 100 %
FIO2: 80 %
MECHVT: 500 mL
Mechanical Rate: 18
O2 Saturation: 95.9 %
O2 Saturation: 99.5 %
PEEP: 5 cmH2O
Patient temperature: 37
pCO2 arterial: 39 mmHg (ref 32–48)
pCO2 arterial: 35 mmHg (ref 32–48)
pH, Arterial: 7.28 — ABNORMAL LOW (ref 7.35–7.45)
pH, Arterial: 7.42 (ref 7.35–7.45)
pO2, Arterial: 79 mmHg — ABNORMAL LOW (ref 83–108)
pO2, Arterial: 204 mmHg — ABNORMAL HIGH (ref 83–108)

## 2023-06-10 LAB — URINE DRUG SCREEN, QUALITATIVE (ARMC ONLY)
Amphetamines, Ur Screen: NOT DETECTED
Barbiturates, Ur Screen: NOT DETECTED
Benzodiazepine, Ur Scrn: POSITIVE — AB
Cannabinoid 50 Ng, Ur ~~LOC~~: POSITIVE — AB
Cocaine Metabolite,Ur ~~LOC~~: NOT DETECTED
MDMA (Ecstasy)Ur Screen: NOT DETECTED
Methadone Scn, Ur: NOT DETECTED
Opiate, Ur Screen: POSITIVE — AB
Phencyclidine (PCP) Ur S: NOT DETECTED
Tricyclic, Ur Screen: NOT DETECTED

## 2023-06-10 LAB — HEMOGLOBIN A1C
Hgb A1c MFr Bld: 5.3 % (ref 4.8–5.6)
Mean Plasma Glucose: 105.41 mg/dL

## 2023-06-10 LAB — URINALYSIS, ROUTINE W REFLEX MICROSCOPIC
Bilirubin Urine: NEGATIVE
Glucose, UA: 50 mg/dL — AB
Ketones, ur: NEGATIVE mg/dL
Nitrite: NEGATIVE
Protein, ur: >=300 mg/dL — AB
Specific Gravity, Urine: 1.012 (ref 1.005–1.030)
pH: 7 (ref 5.0–8.0)

## 2023-06-10 LAB — PROTIME-INR
INR: 1.4 — ABNORMAL HIGH (ref 0.8–1.2)
Prothrombin Time: 17.1 s — ABNORMAL HIGH (ref 11.4–15.2)

## 2023-06-10 LAB — BASIC METABOLIC PANEL
Anion gap: 32 — ABNORMAL HIGH (ref 5–15)
Anion gap: 24 — ABNORMAL HIGH (ref 5–15)
BUN: 10 mg/dL (ref 6–20)
BUN: 11 mg/dL (ref 6–20)
CO2: 19 mmol/L — ABNORMAL LOW (ref 22–32)
CO2: 22 mmol/L (ref 22–32)
Calcium: 9.9 mg/dL (ref 8.9–10.3)
Calcium: 8.6 mg/dL — ABNORMAL LOW (ref 8.9–10.3)
Chloride: 88 mmol/L — ABNORMAL LOW (ref 98–111)
Chloride: 94 mmol/L — ABNORMAL LOW (ref 98–111)
Creatinine, Ser: 1.62 mg/dL — ABNORMAL HIGH (ref 0.44–1.00)
Creatinine, Ser: 1.76 mg/dL — ABNORMAL HIGH (ref 0.44–1.00)
GFR, Estimated: 42 mL/min — ABNORMAL LOW (ref >60)
GFR, Estimated: 38 mL/min — ABNORMAL LOW (ref >60)
Glucose, Bld: 243 mg/dL — ABNORMAL HIGH (ref 70–99)
Glucose, Bld: 134 mg/dL — ABNORMAL HIGH (ref 70–99)
Potassium: 2.5 mmol/L — CL (ref 3.5–5.1)
Potassium: 3.2 mmol/L — ABNORMAL LOW (ref 3.5–5.1)
Sodium: 139 mmol/L (ref 135–145)
Sodium: 140 mmol/L (ref 135–145)

## 2023-06-10 LAB — GLUCOSE, CAPILLARY
Glucose-Capillary: 210 mg/dL — ABNORMAL HIGH (ref 70–99)
Glucose-Capillary: 121 mg/dL — ABNORMAL HIGH (ref 70–99)
Glucose-Capillary: 137 mg/dL — ABNORMAL HIGH (ref 70–99)

## 2023-06-10 LAB — MRSA NEXT GEN BY PCR, NASAL: MRSA by PCR Next Gen: NOT DETECTED

## 2023-06-10 LAB — HEPATIC FUNCTION PANEL
ALT: 38 U/L (ref 0–44)
AST: 226 U/L — ABNORMAL HIGH (ref 15–41)
Albumin: 2.7 g/dL — ABNORMAL LOW (ref 3.5–5.0)
Alkaline Phosphatase: 263 U/L — ABNORMAL HIGH (ref 38–126)
Bilirubin, Direct: 1.6 mg/dL — ABNORMAL HIGH (ref 0.0–0.2)
Indirect Bilirubin: 1.8 mg/dL — ABNORMAL HIGH (ref 0.3–0.9)
Total Bilirubin: 3.4 mg/dL — ABNORMAL HIGH (ref 0.0–1.2)
Total Protein: 6.9 g/dL (ref 6.5–8.1)

## 2023-06-10 LAB — LACTIC ACID, PLASMA
Lactic Acid, Venous: >9 mmol/L (ref 0.5–1.9)
Lactic Acid, Venous: >9 mmol/L (ref 0.5–1.9)
Lactic Acid, Venous: >9 mmol/L (ref 0.5–1.9)
Lactic Acid, Venous: >9 mmol/L (ref 0.5–1.9)

## 2023-06-10 LAB — BRAIN NATRIURETIC PEPTIDE: B Natriuretic Peptide: 114.1 pg/mL — ABNORMAL HIGH (ref 0.0–100.0)

## 2023-06-10 LAB — HEPARIN LEVEL (UNFRACTIONATED): Heparin Unfractionated: 0.2 [IU]/mL — ABNORMAL LOW (ref 0.30–0.70)

## 2023-06-10 LAB — ETHANOL: Alcohol, Ethyl (B): <10 mg/dL (ref <10)

## 2023-06-10 LAB — RESP PANEL BY RT-PCR (RSV, FLU A&B, COVID)  RVPGX2
Influenza A by PCR: NEGATIVE
Influenza B by PCR: NEGATIVE
Resp Syncytial Virus by PCR: NEGATIVE
SARS Coronavirus 2 by RT PCR: NEGATIVE

## 2023-06-10 LAB — TROPONIN I (HIGH SENSITIVITY)
Troponin I (High Sensitivity): 968 ng/L (ref <18)
Troponin I (High Sensitivity): 11582 ng/L (ref <18)
Troponin I (High Sensitivity): >24000 ng/L (ref <18)

## 2023-06-10 LAB — LIPASE, BLOOD: Lipase: 69 U/L — ABNORMAL HIGH (ref 11–51)

## 2023-06-10 LAB — ACETAMINOPHEN LEVEL: Acetaminophen (Tylenol), Serum: <10 ug/mL — ABNORMAL LOW (ref 10–30)

## 2023-06-10 LAB — TYPE AND SCREEN

## 2023-06-10 LAB — POTASSIUM: Potassium: 3.3 mmol/L — ABNORMAL LOW (ref 3.5–5.1)

## 2023-06-10 LAB — BETA-HYDROXYBUTYRIC ACID: Beta-Hydroxybutyric Acid: 0.16 mmol/L (ref 0.05–0.27)

## 2023-06-10 LAB — APTT: aPTT: 37 s — ABNORMAL HIGH (ref 24–36)

## 2023-06-10 LAB — AMYLASE: Amylase: 194 U/L — ABNORMAL HIGH (ref 28–100)

## 2023-06-10 LAB — PHOSPHORUS: Phosphorus: 7.3 mg/dL — ABNORMAL HIGH (ref 2.5–4.6)

## 2023-06-10 LAB — MAGNESIUM
Magnesium: 1.5 mg/dL — ABNORMAL LOW (ref 1.7–2.4)
Magnesium: 2.3 mg/dL (ref 1.7–2.4)

## 2023-06-10 LAB — CK: Total CK: 481 U/L — ABNORMAL HIGH (ref 38–234)

## 2023-06-10 LAB — PROCALCITONIN: Procalcitonin: 0.78 ng/mL

## 2023-06-10 LAB — AMMONIA: Ammonia: 218 umol/L — ABNORMAL HIGH (ref 9–35)

## 2023-06-10 MED ORDER — HEPARIN BOLUS VIA INFUSION
1000.0000 [IU] | Freq: Once | INTRAVENOUS | Status: AC
  Administered 2023-06-10: 1000 [IU] via INTRAVENOUS
  Filled 2023-06-10: qty 1000

## 2023-06-10 MED ORDER — HEPARIN (PORCINE) 25000 UT/250ML-% IV SOLN
1400.0000 [IU]/h | INTRAVENOUS | Status: DC
  Administered 2023-06-10: 900 [IU]/h via INTRAVENOUS
  Administered 2023-06-11: 1250 [IU]/h via INTRAVENOUS
  Administered 2023-06-12: 1400 [IU]/h via INTRAVENOUS
  Filled 2023-06-10 (×3): qty 250

## 2023-06-10 MED ORDER — ADULT MULTIVITAMIN LIQUID CH
15.0000 mL | Freq: Every day | ORAL | Status: DC
  Administered 2023-06-11 – 2023-06-14 (×4): 15 mL
  Filled 2023-06-10 (×5): qty 15

## 2023-06-10 MED ORDER — LEVETIRACETAM IN NACL 1000 MG/100ML IV SOLN
1000.0000 mg | Freq: Once | INTRAVENOUS | Status: AC
Start: 2023-06-10 — End: 2023-06-10
  Administered 2023-06-10: 1000 mg via INTRAVENOUS

## 2023-06-10 MED ORDER — METRONIDAZOLE 500 MG/100ML IV SOLN
500.0000 mg | Freq: Two times a day (BID) | INTRAVENOUS | Status: DC
  Administered 2023-06-10 – 2023-06-12 (×4): 500 mg via INTRAVENOUS
  Filled 2023-06-10 (×5): qty 100

## 2023-06-10 MED ORDER — PROPOFOL 1000 MG/100ML IV EMUL
5.0000 ug/kg/min | INTRAVENOUS | Status: DC
  Administered 2023-06-10: 10 ug/kg/min via INTRAVENOUS
  Administered 2023-06-11: 45 ug/kg/min via INTRAVENOUS
  Administered 2023-06-11: 5 ug/kg/min via INTRAVENOUS
  Administered 2023-06-11 – 2023-06-14 (×11): 45 ug/kg/min via INTRAVENOUS
  Filled 2023-06-10 (×16): qty 100

## 2023-06-10 MED ORDER — LEVETIRACETAM IN NACL 500 MG/100ML IV SOLN
500.0000 mg | Freq: Two times a day (BID) | INTRAVENOUS | Status: DC
  Administered 2023-06-10: 500 mg via INTRAVENOUS
  Filled 2023-06-10 (×2): qty 100

## 2023-06-10 MED ORDER — LACTULOSE 10 GM/15ML PO SOLN
20.0000 g | Freq: Two times a day (BID) | ORAL | Status: DC
  Administered 2023-06-10: 20 g
  Filled 2023-06-10: qty 30

## 2023-06-10 MED ORDER — FENTANYL CITRATE PF 50 MCG/ML IJ SOSY: 50.0000 ug | PREFILLED_SYRINGE | Freq: Once | INTRAMUSCULAR | Status: DC

## 2023-06-10 MED ORDER — LEVETIRACETAM IN NACL 1000 MG/100ML IV SOLN
1000.0000 mg | Freq: Once | INTRAVENOUS | Status: AC
  Administered 2023-06-10: 1000 mg via INTRAVENOUS
  Filled 2023-06-10: qty 100

## 2023-06-10 MED ORDER — MIDAZOLAM BOLUS VIA INFUSION: 0.0000 mg | INTRAVENOUS | Status: DC | PRN

## 2023-06-10 MED ORDER — POTASSIUM CHLORIDE 10 MEQ/100ML IV SOLN
10.0000 meq | INTRAVENOUS | Status: AC
  Administered 2023-06-10 – 2023-06-11 (×2): 10 meq via INTRAVENOUS
  Filled 2023-06-10 (×2): qty 100

## 2023-06-10 MED ORDER — IPRATROPIUM-ALBUTEROL 0.5-2.5 (3) MG/3ML IN SOLN: 3.0000 mL | Freq: Four times a day (QID) | RESPIRATORY_TRACT | Status: DC | PRN

## 2023-06-10 MED ORDER — MIDAZOLAM BOLUS VIA INFUSION
5.0000 mg | Freq: Once | INTRAVENOUS | Status: AC
  Administered 2023-06-10: 5 mg via INTRAVENOUS
  Filled 2023-06-10: qty 5

## 2023-06-10 MED ORDER — FENTANYL BOLUS VIA INFUSION: 50.0000 ug | INTRAVENOUS | Status: DC | PRN

## 2023-06-10 MED ORDER — POTASSIUM CHLORIDE 10 MEQ/100ML IV SOLN
10.0000 meq | INTRAVENOUS | Status: AC
Start: 2023-06-10 — End: 2023-06-10
  Administered 2023-06-10 (×6): 10 meq via INTRAVENOUS
  Filled 2023-06-10 (×5): qty 100

## 2023-06-10 MED ORDER — MAGNESIUM SULFATE 4 GM/100ML IV SOLN
4.0000 g | Freq: Once | INTRAVENOUS | Status: AC
  Administered 2023-06-10: 4 g via INTRAVENOUS
  Filled 2023-06-10: qty 100

## 2023-06-10 MED ORDER — HEPARIN BOLUS VIA INFUSION
4000.0000 [IU] | Freq: Once | INTRAVENOUS | Status: DC
  Filled 2023-06-10: qty 4000

## 2023-06-10 MED ORDER — VANCOMYCIN HCL 750 MG/150ML IV SOLN
750.0000 mg | INTRAVENOUS | Status: DC
  Filled 2023-06-10: qty 150

## 2023-06-10 MED ORDER — INSULIN ASPART 100 UNIT/ML IJ SOLN
0.0000 [IU] | INTRAMUSCULAR | Status: DC
  Administered 2023-06-10: 1 [IU] via SUBCUTANEOUS
  Administered 2023-06-10: 3 [IU] via SUBCUTANEOUS
  Administered 2023-06-10 – 2023-06-12 (×4): 1 [IU] via SUBCUTANEOUS
  Administered 2023-06-12: 2 [IU] via SUBCUTANEOUS
  Administered 2023-06-13 – 2023-06-14 (×2): 1 [IU] via SUBCUTANEOUS
  Filled 2023-06-10 (×10): qty 1

## 2023-06-10 MED ORDER — NOREPINEPHRINE 16 MG/250ML-% IV SOLN
0.0000 ug/min | INTRAVENOUS | Status: DC
  Administered 2023-06-10 – 2023-06-13 (×2): 2 ug/min via INTRAVENOUS
  Filled 2023-06-10 (×2): qty 250

## 2023-06-10 MED ORDER — DOCUSATE SODIUM 100 MG PO CAPS: 100.0000 mg | ORAL_CAPSULE | Freq: Two times a day (BID) | ORAL | Status: DC | PRN

## 2023-06-10 MED ORDER — VECURONIUM BROMIDE 10 MG IV SOLR
10.0000 mg | INTRAVENOUS | Status: DC | PRN
  Administered 2023-06-10: 10 mg via INTRAVENOUS
  Filled 2023-06-10: qty 10

## 2023-06-10 MED ORDER — CHLORHEXIDINE GLUCONATE CLOTH 2 % EX PADS
6.0000 | MEDICATED_PAD | Freq: Every day | CUTANEOUS | Status: DC
  Administered 2023-06-11 – 2023-06-14 (×4): 6 via TOPICAL

## 2023-06-10 MED ORDER — LACTATED RINGERS IV BOLUS
1000.0000 mL | Freq: Once | INTRAVENOUS | Status: AC
  Administered 2023-06-10: 1000 mL via INTRAVENOUS

## 2023-06-10 MED ORDER — POLYETHYLENE GLYCOL 3350 17 G PO PACK: 17.0000 g | PACK | Freq: Every day | ORAL | Status: DC | PRN

## 2023-06-10 MED ORDER — ORAL CARE MOUTH RINSE
15.0000 mL | OROMUCOSAL | Status: DC
  Administered 2023-06-10 – 2023-06-14 (×44): 15 mL via OROMUCOSAL

## 2023-06-10 MED ORDER — FENTANYL 2500MCG IN NS 250ML (10MCG/ML) PREMIX INFUSION
50.0000 ug/h | INTRAVENOUS | Status: DC
  Administered 2023-06-10: 50 ug/h via INTRAVENOUS
  Administered 2023-06-11: 75 ug/h via INTRAVENOUS
  Filled 2023-06-10 (×3): qty 250

## 2023-06-10 MED ORDER — PANTOPRAZOLE SODIUM 40 MG IV SOLR
40.0000 mg | INTRAVENOUS | Status: DC
  Administered 2023-06-10 – 2023-06-13 (×4): 40 mg via INTRAVENOUS
  Filled 2023-06-10 (×4): qty 10

## 2023-06-10 MED ORDER — CHLORHEXIDINE GLUCONATE CLOTH 2 % EX PADS
6.0000 | MEDICATED_PAD | Freq: Every day | CUTANEOUS | Status: DC
  Filled 2023-06-10: qty 6

## 2023-06-10 MED ORDER — VANCOMYCIN HCL 1500 MG/300ML IV SOLN
1500.0000 mg | Freq: Once | INTRAVENOUS | Status: AC
  Administered 2023-06-10: 1500 mg via INTRAVENOUS
  Filled 2023-06-10: qty 300

## 2023-06-10 MED ORDER — POTASSIUM CHLORIDE 10 MEQ/100ML IV SOLN
10.0000 meq | Freq: Once | INTRAVENOUS | Status: DC
  Filled 2023-06-10: qty 100

## 2023-06-10 MED ORDER — ENOXAPARIN SODIUM 40 MG/0.4ML IJ SOSY: 40.0000 mg | PREFILLED_SYRINGE | INTRAMUSCULAR | Status: DC

## 2023-06-10 MED ORDER — ORAL CARE MOUTH RINSE: 15.0000 mL | OROMUCOSAL | Status: DC | PRN

## 2023-06-10 MED ORDER — SODIUM CHLORIDE 0.9 % IV SOLN
2.0000 g | Freq: Two times a day (BID) | INTRAVENOUS | Status: DC
  Administered 2023-06-10 – 2023-06-12 (×4): 2 g via INTRAVENOUS
  Filled 2023-06-10 (×6): qty 2

## 2023-06-10 MED ORDER — PHENTOLAMINE MESYLATE 5 MG IJ SOLR
10.0000 mg | Freq: Once | INTRAMUSCULAR | Status: AC
  Administered 2023-06-10: 10 mg via SUBCUTANEOUS
  Filled 2023-06-10: qty 10

## 2023-06-10 MED ORDER — THIAMINE HCL 100 MG/ML IJ SOLN
100.0000 mg | Freq: Every day | INTRAMUSCULAR | Status: DC
  Administered 2023-06-14: 100 mg via INTRAVENOUS
  Filled 2023-06-10: qty 2

## 2023-06-10 MED ORDER — SODIUM CHLORIDE 0.9 % IV SOLN: INTRAVENOUS | Status: AC

## 2023-06-10 MED ORDER — FAMOTIDINE 20 MG PO TABS
20.0000 mg | ORAL_TABLET | Freq: Two times a day (BID) | ORAL | Status: DC
Start: 2023-06-10 — End: 2023-06-10

## 2023-06-10 MED ORDER — ACETAMINOPHEN 160 MG/5ML PO SOLN: 650.0000 mg | ORAL | Status: DC | PRN

## 2023-06-10 MED ORDER — FOLIC ACID 5 MG/ML IJ SOLN
1.0000 mg | Freq: Every day | INTRAMUSCULAR | Status: DC
  Administered 2023-06-10 – 2023-06-14 (×5): 1 mg via INTRAVENOUS
  Filled 2023-06-10 (×5): qty 0.2

## 2023-06-10 MED ORDER — SODIUM CHLORIDE 0.9 % IV SOLN: 1.0000 mg | Freq: Once | INTRAVENOUS | Status: DC

## 2023-06-10 MED ORDER — THIAMINE HCL 100 MG/ML IJ SOLN
500.0000 mg | Freq: Every day | INTRAVENOUS | Status: AC
  Administered 2023-06-10 – 2023-06-13 (×4): 500 mg via INTRAVENOUS
  Filled 2023-06-10 (×4): qty 5

## 2023-06-10 MED ORDER — NOREPINEPHRINE 4 MG/250ML-% IV SOLN
0.0000 ug/min | INTRAVENOUS | Status: DC
Start: 2023-06-10 — End: 2023-06-10
  Administered 2023-06-10: 10 ug/min via INTRAVENOUS
  Administered 2023-06-10: 2 ug/min via INTRAVENOUS
  Filled 2023-06-10: qty 250

## 2023-06-10 MED ORDER — MIDAZOLAM-SODIUM CHLORIDE 100-0.9 MG/100ML-% IV SOLN
20.0000 mg/h | INTRAVENOUS | Status: DC
  Administered 2023-06-10: 2 mg/h via INTRAVENOUS
  Administered 2023-06-11 (×3): 20 mg/h via INTRAVENOUS
  Filled 2023-06-10 (×5): qty 100

## 2023-06-10 MED ORDER — ACETAMINOPHEN 650 MG RE SUPP: 650.0000 mg | RECTAL | Status: DC | PRN

## 2023-06-10 MED ORDER — ACETAMINOPHEN 325 MG PO TABS: 650.0000 mg | ORAL_TABLET | ORAL | Status: DC | PRN

## 2023-06-10 NOTE — Progress Notes (Signed)
 PHARMACY CONSULT NOTE - FOLLOW UP  Pharmacy Consult for Electrolyte Monitoring and Replacement   Recent Labs: Potassium (mmol/L)  Date Value  06/10/2023 2.5 (LL)   Magnesium (mg/dL)  Date Value  11/91/4782 1.7   Calcium (mg/dL)  Date Value  95/62/1308 9.9   Albumin (g/dL)  Date Value  65/78/4696 3.9   Phosphorus (mg/dL)  Date Value  29/52/8413 2.3 (L)   Sodium (mmol/L)  Date Value  06/10/2023 139  05/08/2023 140     Assessment: 38 yo female with a PMH of ETOH abuse, seizure activity secondary to ETOH withdrawal, and alcoholic liver cirrhosis. Pharmacy is asked to follow and replace electrolytes while in CCU  Goal of Therapy:  Electrolytes WNL  Plan:  ---10 mEq IV KCl x 6 per MD ---recheck electrolytes in am  Lowella Bandy ,PharmD Clinical Pharmacist 06/10/2023 1:35 PM

## 2023-06-10 NOTE — ED Notes (Signed)
 10 versed given at this time by RN Erskine Squibb

## 2023-06-10 NOTE — ED Notes (Signed)
 Pt cleaned up and placed in clean gown. Pt pulled up in bed. Foley cath placed with RN Erskine Squibb. Pt provided warm blanket.

## 2023-06-10 NOTE — TOC CM/SW Note (Signed)
 TOC consult received:   Obtain advanced directive and primary contact / POA information   Patient has family listed in chart including fiance and mother. Reached out to RN and MD to verify if any TOC needs at this time.  Alfonso Ramus, LCSW Transitions of Care Department [phone]

## 2023-06-10 NOTE — Progress Notes (Signed)
 PHARMACY - ANTICOAGULATION CONSULT NOTE  Pharmacy Consult for heparin infusion Indication: chest pain/ACS  Allergies  Allergen Reactions   Penicillins Swelling    Tolerated ceftriaxone and cefepime in past per [facility] and [facility] records    Patient Measurements: Height: 5\' 3"  (160 cm) Weight: 77.6 kg (171 lb 1.2 oz) IBW/kg (Calculated) : 52.4 Heparin Dosing Weight: 69 kg  Vital Signs: Temp: 97.7 F (36.5 C) (03/02 1521) Temp Source: Bladder (03/02 1521) BP: 167/150 (03/02 1445) Pulse Rate: 76 (03/02 1521)  Labs: Recent Labs    06/10/23 1129 06/10/23 1344  HGB 15.2*  --   HCT 46.7*  --   PLT 236  --   CREATININE 1.62*  --   CKTOTAL  --  481*  TROPONINIHS 968* 11,582*    Estimated Creatinine Clearance: 46.9 mL/min (A) (by C-G formula based on SCr of 1.62 mg/dL (H)).   Medical History: Past Medical History:  Diagnosis Date   Alcohol abuse    Anxiety    Biliary calculi 12/07/2009   Cholelithiasis    Depression    Diet-controlled diabetes mellitus (HCC)    GERD (gastroesophageal reflux disease)    Hepatitis C    body "self healed" per patient   LVH (left ventricular hypertrophy)    a. 10/2022 Echo: EF 60-65%, no rwma, sev conc LVH, nl RV size/fxn, triv MR. Mobile density near ant MV leaflet- felt to be redundant chordae tendinae..   Morbid obesity (HCC)    Palpitations    a. 07/2019 Holter: Sinus rhythm, PVCs, rare PACs.  No significant arrhythmias.   Pancreatic pseudocyst    a. 02/2023 MRI abd: improving unilocular pancreatic pseudocyst.   Retained intrauterine contraceptive device (IUD) 01/20/2020   Substance abuse (HCC)     Medications:  Scheduled:   [START ON 06/11/2023] Chlorhexidine Gluconate Cloth  6 each Topical Q0600   fentaNYL (SUBLIMAZE) injection  50 mcg Intravenous Once   folic acid  1 mg Intravenous Daily   insulin aspart  0-9 Units Subcutaneous Q4H   multivitamin  15 mL Per Tube Daily   mouth rinse  15 mL Mouth Rinse Q2H   pantoprazole (PROTONIX)  IV  40 mg Intravenous Q24H   [START ON 06/13/2023] thiamine (VITAMIN B1) injection  100 mg Intravenous Daily    Assessment: 38 yo female with a PMH of ETOH abuse, seizure activity secondary to ETOH withdrawal, and alcoholic liver cirrhosis. She is on no chronic anticoagulation PTA based on dispens history  Baseline Labs: Hgb15.2, PLT wnl, INR/aPTT pending  Goal of Therapy:  Heparin level 0.3-0.7 units/ml Monitor platelets by anticoagulation protocol: Yes   Plan:  Give 4000 units bolus x 1 Start heparin infusion at 900 units/hr Check anti-Xa level in 6 hours and daily while on heparin Continue to monitor H&H and platelets  Lowella Bandy 06/10/2023,3:31 PM

## 2023-06-10 NOTE — ED Notes (Signed)
 PADS placed on pt at this time.

## 2023-06-10 NOTE — Progress Notes (Signed)
 Attempted to update pts mother Jackquline Bosch via telephone regarding pts condition and current plan of care, however she did not answer the telephone.  Left HIPAA appropriate voicemail instructing her to return my phone call.  Zada Girt, AGNP  Pulmonary/Critical Care Pager [phone] (please enter 7 digits) PCCM Consult Pager [phone] (please enter 7 digits)

## 2023-06-10 NOTE — ED Notes (Addendum)
 Rn Beecher Mcardle starting IV and [place] placing pt on monitor. EDP Bradler and RT bagging pt.

## 2023-06-10 NOTE — Progress Notes (Signed)
 Pharmacy Antibiotic Note  Katie Stanley is a 38 y.o. female admitted on 06/10/2023 with sepsis.  Pharmacy has been consulted for ceftazidime and vancomycin dosing.  Plan:   1) start ceftazidime 2 grams IV every 12 hours ---follow renal function for needed dose adjustments  2) start vancomycin 1500 mg IV x 1 then 750 mg IV every 24 hours  Goal AUC 400-550. Expected AUC: 525.9 SCr used: 1.62 mg/dL   Height: 5\' 3"  (160 cm) Weight: 77.1 kg (170 lb) IBW/kg (Calculated) : 52.4  Temp (24hrs), Avg:97 F (36.1 C), Min:96.6 F (35.9 C), Max:97.5 F (36.4 C)  Recent Labs  Lab 06/10/23 1129 06/10/23 1306  WBC 17.3*  --   CREATININE 1.62*  --   LATICACIDVEN >9.0* >9.0*    Estimated Creatinine Clearance: 46.8 mL/min (A) (by C-G formula based on SCr of 1.62 mg/dL (H)).    Allergies  Allergen Reactions   Penicillins Swelling    Tolerated ceftriaxone and cefepime in past per [facility] and [facility] records    Antimicrobials this admission: 03/02 ceftazidime >>   03/02 vancomycin >> 03/02 metronidazole >>  Microbiology results: 03/02 BCx: pending 03/02 Sputum: pending   Thank you for allowing pharmacy to be a part of this patient's care.  Lowella Bandy 06/10/2023 2:54 PM

## 2023-06-10 NOTE — ED Notes (Signed)
1 epi given 

## 2023-06-10 NOTE — Plan of Care (Signed)
 38 yo p/w cardiac arrest 3/2 after EtOH withdrawal seizure. Contacted by Ms. Ouma NP that patient has escalating vent requirements in the setting of violent clinical status myoclonus. She is maxed out on versed and received 60mg /kg keppra earlier this afternoon. She remains on levophed and I do not feel comfortable adding propofol 2/2 concern for exacerbating hypotension. Recommended neuromuscular paralysis as needed to prevent vent dyssynchrony. Starting ceribell. Full consult note to follow shortly.  Bing Neighbors, MD [facility] [phone]  If 7pm- 7am, please page neurology on call as listed in AMION.

## 2023-06-10 NOTE — ED Notes (Addendum)
 Norepi adjusted to 20 mcg/min

## 2023-06-10 NOTE — ED Provider Notes (Signed)
 [facility] Provider Note   Event Date/Time   First MD Initiated Contact with Patient 06/10/23 1149     (approximate) History  cpr  HPI Katie Stanley is a 38 y.o. female with unknown past medical history presents from home via EMS after family witnessed what was described as seizure-like activity and subsequent loss of consciousness.  EMS states patient received 4 separate doses of epinephrine, amiodarone load, and 2 A of bicarb.  They noted patient to be in ventricular fibrillation and after 1 defibrillation, patient obtained ROSC Per patient's significant other, patient has had heavy alcohol abuse over the past week with approximately 1/2 pint of liquor at least every day.  Patient attempted to stop over the last few days and did have an episode of seizure activity yesterday evening.  Patient then attempted to have a drink after this activity to stave off any more seizures.  However patient has not had anything to drink so far today and had another episode of seizure activity.  He described the seizure activity today as a staring spell and then "tensing up" with abnormal respirations followed by shaking activity and tongue trauma.  Denies any bladder incontinence but has had bowel incontinence. ROS: Unable to assess   Physical Exam  Triage Vital Signs: ED Triage Vitals  Encounter Vitals Group     BP 06/10/23 1118 (!) 132/113     Systolic BP Percentile --      Diastolic BP Percentile --      Pulse Rate 06/10/23 1119 (!) 41     Resp 06/10/23 1121 18     Temp --      Temp src --      SpO2 06/10/23 1119 (!) 59 %     Weight 06/10/23 1142 170 lb (77.1 kg)     Height 06/10/23 1142 5\' 3"  (1.6 m)     Head Circumference --      Peak Flow --      Pain Score 06/10/23 1118 0     Pain Loc --      Pain Education --      Exclude from Growth Chart --    Most recent vital signs: Vitals:   06/10/23 1122 06/10/23 1123  BP:    Pulse:  (!) 105  Resp: 13 (!) 21  SpO2:   100%   General: GCS 3, on stretcher CV:  Poor peripheral perfusion.  Resp:  Gasping respirations, King tube in place Abd:  No distention.  Other:  Obese middle-aged Caucasian female unresponsive on stretcher with King airway in place and nonpurposeful movements of the upper extremities ED Results / Procedures / Treatments  Labs (all labs ordered are listed, but only abnormal results are displayed) Labs Reviewed  CBC - Abnormal; Notable for the following components:      Result Value   WBC 17.3 (*)    Hemoglobin 15.2 (*)    HCT 46.7 (*)    RDW 16.9 (*)    All other components within normal limits  BASIC METABOLIC PANEL - Abnormal; Notable for the following components:   Potassium 2.5 (*)    Chloride 88 (*)    CO2 19 (*)    Glucose, Bld 243 (*)    Creatinine, Ser 1.62 (*)    GFR, Estimated 42 (*)    Anion gap 32 (*)    All other components within normal limits  LACTIC ACID, PLASMA - Abnormal; Notable for the following components:   Lactic  Acid, Venous >9.0 (*)    All other components within normal limits  BLOOD GAS, ARTERIAL - Abnormal; Notable for the following components:   pH, Arterial 7.28 (*)    pO2, Arterial 79 (*)    Bicarbonate 18.3 (*)    Acid-base deficit 7.9 (*)    All other components within normal limits  LACTIC ACID, PLASMA  URINE DRUG SCREEN, QUALITATIVE ([facility] ONLY)  URINALYSIS, ROUTINE W REFLEX MICROSCOPIC  ETHANOL  CK  HEPATIC FUNCTION PANEL  TYPE AND SCREEN  TYPE AND SCREEN  TROPONIN I (HIGH SENSITIVITY)   EKG ED ECG REPORT I, Merwyn Katos, the attending physician, personally viewed and interpreted this ECG. Date: 06/10/2023 EKG Time: 1149 Rate: 78 Rhythm: normal sinus rhythm QRS Axis: normal Intervals: normal ST/T Wave abnormalities: normal Narrative Interpretation: no evidence of acute ischemia RADIOLOGY ED MD interpretation: CT of the head without contrast interpreted by me shows no evidence of acute abnormalities including no  intracerebral hemorrhage, obvious masses, or significant edema  CT of the cervical spine interpreted by me does not show any evidence of acute abnormalities including no acute fracture, malalignment, height loss, or dislocation  Single view portable chest x-ray shows endotracheal tube position with tip in the mid trachea as well as esophagogastric tube in the tip sideport below the diaphragm. -Agree with radiology assessment Official radiology report(s): CT HEAD WO CONTRAST ( ) Result Date: 06/10/2023 CLINICAL DATA:  Unresponsive at home after seizure.  Arrest. EXAM: CT HEAD WITHOUT CONTRAST CT CERVICAL SPINE WITHOUT CONTRAST TECHNIQUE: Multidetector CT imaging of the head and cervical spine was performed following the standard protocol without intravenous contrast. Multiplanar CT image reconstructions of the cervical spine were also generated. RADIATION DOSE REDUCTION: This exam was performed according to the departmental dose-optimization program which includes automated exposure control, adjustment of the mA and/or kV according to patient size and/or use of iterative reconstruction technique. COMPARISON:  Head CT 06/22/2022 FINDINGS: CT HEAD FINDINGS Brain: No evidence of acute infarction, hemorrhage, hydrocephalus, extra-axial collection or mass lesion/mass effect. Dilated perivascular space below the right putamen. Vascular: No hyperdense vessel or unexpected calcification. Skull: Normal. Negative for fracture or focal lesion. Sinuses/Orbits: Nasopharyngeal fluid in the setting of intubation. Symmetric proptosis from increase in retro-orbital fat, also seen on prior. CT CERVICAL SPINE FINDINGS Alignment: Normal. Skull base and vertebrae: No acute fracture. No primary bone lesion or focal pathologic process. Soft tissues and spinal canal: No prevertebral fluid or swelling. No visible canal hematoma. Calcification at lower right internal jugular vein, likely fibrin sheath from old access. Unremarkable  hardware were traversing the neck. Disc levels:  No degenerative changes or visible impingement. Upper chest: Band of atelectasis in the left upper lobe. IMPRESSION: No acute intracranial finding.  No visible anoxic injury. Negative cervical spine CT. Electronically Signed   By: Tiburcio Pea M.D.   On: 06/10/2023 12:47   CT Cervical Spine Wo Contrast Result Date: 06/10/2023 CLINICAL DATA:  Unresponsive at home after seizure.  Arrest. EXAM: CT HEAD WITHOUT CONTRAST CT CERVICAL SPINE WITHOUT CONTRAST TECHNIQUE: Multidetector CT imaging of the head and cervical spine was performed following the standard protocol without intravenous contrast. Multiplanar CT image reconstructions of the cervical spine were also generated. RADIATION DOSE REDUCTION: This exam was performed according to the departmental dose-optimization program which includes automated exposure control, adjustment of the mA and/or kV according to patient size and/or use of iterative reconstruction technique. COMPARISON:  Head CT 06/22/2022 FINDINGS: CT HEAD FINDINGS Brain: No evidence of acute infarction,  hemorrhage, hydrocephalus, extra-axial collection or mass lesion/mass effect. Dilated perivascular space below the right putamen. Vascular: No hyperdense vessel or unexpected calcification. Skull: Normal. Negative for fracture or focal lesion. Sinuses/Orbits: Nasopharyngeal fluid in the setting of intubation. Symmetric proptosis from increase in retro-orbital fat, also seen on prior. CT CERVICAL SPINE FINDINGS Alignment: Normal. Skull base and vertebrae: No acute fracture. No primary bone lesion or focal pathologic process. Soft tissues and spinal canal: No prevertebral fluid or swelling. No visible canal hematoma. Calcification at lower right internal jugular vein, likely fibrin sheath from old access. Unremarkable hardware were traversing the neck. Disc levels:  No degenerative changes or visible impingement. Upper chest: Band of atelectasis in the  left upper lobe. IMPRESSION: No acute intracranial finding.  No visible anoxic injury. Negative cervical spine CT. Electronically Signed   By: Tiburcio Pea M.D.   On: 06/10/2023 12:47   DG Chest Port 1 View Result Date: 06/10/2023 CLINICAL DATA:  Cardiac arrest, tube placement EXAM: PORTABLE CHEST 1 VIEW COMPARISON:  04/14/2023 FINDINGS: Endotracheal tube is positioned with tip in the midtrachea. Esophagogastric tube with tip and side port below the diaphragm. Evaluation of the heart and lungs very limited by overlying compression board; no obvious abnormality. No acute osseous findings. IMPRESSION: 1. Endotracheal tube is positioned with tip in the midtrachea. 2. Esophagogastric tube with tip and side port below the diaphragm. 3. Evaluation of the heart and lungs very limited by overlying compression board; no obvious abnormality. Electronically Signed   By: Jearld Lesch M.D.   On: 06/10/2023 12:10   PROCEDURES: Critical Care performed: Yes, see critical care procedure note(s) Procedure Name: Intubation Date/Time: 06/10/2023 12:49 PM  Performed by: Merwyn Katos, MDPre-anesthesia Checklist: Patient identified, Patient being monitored, Emergency Drugs available, Timeout performed and Suction available Oxygen Delivery Method: Non-rebreather mask Preoxygenation: Pre-oxygenation with 100% oxygen Induction Type: Rapid sequence Ventilation: Mask ventilation without difficulty Laryngoscope Size: Glidescope Grade View: Grade I Tube size: 7.5 mm Number of attempts: 1 Airway Equipment and Method: Video-laryngoscopy Placement Confirmation: ETT inserted through vocal cords under direct vision, CO2 detector and Breath sounds checked- equal and bilateral Secured at: 22 cm Tube secured with: ETT holder Dental Injury: Teeth and Oropharynx as per pre-operative assessment     .1-3 Lead EKG Interpretation  Performed by: Merwyn Katos, MD Authorized by: Merwyn Katos, MD     Interpretation:  abnormal     ECG rate:  101   ECG rate assessment: tachycardic     Rhythm: sinus tachycardia     Ectopy: none     Conduction: normal   CRITICAL CARE Performed by: Merwyn Katos  Total critical care time: 45 minutes  Critical care time was exclusive of separately billable procedures and treating other patients.  Critical care was necessary to treat or prevent imminent or life-threatening deterioration.  Critical care was time spent personally by me on the following activities: development of treatment plan with patient and/or surrogate as well as nursing, discussions with consultants, evaluation of patient's response to treatment, examination of patient, obtaining history from patient or surrogate, ordering and performing treatments and interventions, ordering and review of laboratory studies, ordering and review of radiographic studies, pulse oximetry and re-evaluation of patient's condition.  MEDICATIONS ORDERED IN ED: Medications  norepinephrine (LEVOPHED) 4mg  in (0.016 mg/mL) premix infusion (18 mcg/min Intravenous Rate/Dose Change 06/10/23 1235)  fentaNYL (SUBLIMAZE) injection 50 mcg (has no administration in time range)  fentaNYL in NS (16mcg/ml) infusion-PREMIX (50 mcg/hr  Intravenous New Bag/Given 06/10/23 1251)  fentaNYL (SUBLIMAZE) bolus via infusion 50-100 mcg (has no administration in time range)  midazolam (VERSED) 100 mg/100 mL (1 mg/mL) premix infusion (2 mg/hr Intravenous New Bag/Given 06/10/23 1250)  midazolam (VERSED) bolus via infusion 0-5 mg (has no administration in time range)  potassium chloride 10 mEq in 100 mL IVPB (has no administration in time range)  0.9 %  sodium chloride infusion (has no administration in time range)  levETIRAcetam (KEPPRA) IVPB 1000 mg/100 mL premix (0 mg Intravenous Stopped 06/10/23 1233)  levETIRAcetam (KEPPRA) IVPB 1000 mg/100 mL premix (0 mg Intravenous Stopped 06/10/23 1233)   IMPRESSION / MDM / ASSESSMENT AND PLAN / ED  COURSE  I reviewed the triage vital signs and the nursing notes.                             The patient is on the cardiac monitor to evaluate for evidence of arrhythmia and/or significant heart rate changes. Patient's presentation is most consistent with acute presentation with potential threat to life or bodily function. + recent reduction in alcohol use from chronic abuse + Seizure-like activity with witnessed arrest  Workup: Including but not limited to POCT glucose, CBC, BMP Interventions: Intubation upon arrival, fentanyl drip, Versed drip, norepinephrine drip  Given History, Exam, and Workup this patient appears to be suffering from alcohol withdrawal seizures and likely hypoxic arrest Presentation not consistent with infectious etiology, thyrotoxicosis, Coingestion toxidrome/withdrawal, primary psychologic disorder such as anxiety  Disposition: Discharge.  Admit to the ICU   FINAL CLINICAL IMPRESSION(S) / ED DIAGNOSES   Final diagnoses:  Perceptual disturbances and seizures concurrent with and due to alcohol withdrawal (HCC)  Cardiac arrest (HCC)  Acute hypoxic respiratory failure (HCC)  Hypokalemia  Lactic acidosis   Rx / DC Orders   ED Discharge Orders     None      Note:  This document was prepared using Dragon voice recognition software and may include unintentional dictation errors.   Merwyn Katos, MD 06/10/23 [phone]

## 2023-06-10 NOTE — Procedures (Signed)
 Central Venous Catheter Insertion Procedure Note  MARIJAH LARRANAGA  [id number]  12-25-1985  Date:06/10/23  Time:4:45 PM   Provider Performing:Kendra Grissett Earnest Conroy   Procedure: Insertion of Non-tunneled Central Venous Catheter(36556) with US guidance (08657)   Indication(s) Medication administration and Difficult access  Consent Unable to obtain consent due to emergent nature of procedure.  Anesthesia Topical only with 1% lidocaine   Timeout Verified patient identification, verified procedure, site/side was marked, verified correct patient position, special equipment/implants available, medications/allergies/relevant history reviewed, required imaging and test results available.  Sterile Technique Maximal sterile technique including full sterile barrier drape, hand hygiene, sterile gown, sterile gloves, mask, hair covering, sterile ultrasound probe cover (if used).  Procedure Description Area of catheter insertion was cleaned with chlorhexidine and draped in sterile fashion.  With real-time ultrasound guidance a central venous catheter was placed into the left internal jugular vein. Nonpulsatile blood flow and easy flushing noted in all ports.  The catheter was sutured in place and sterile dressing applied.  Complications/Tolerance None; patient tolerated the procedure well. Chest X-ray is ordered to verify placement for internal jugular or subclavian cannulation.   Chest x-ray is not ordered for femoral cannulation.  EBL Minimal  Specimen(s) None  Zada Girt, AGNP  Pulmonary/Critical Care Pager [phone] (please enter 7 digits) PCCM Consult Pager [phone] (please enter 7 digits)

## 2023-06-10 NOTE — ED Notes (Signed)
 Lab at bedside

## 2023-06-10 NOTE — Progress Notes (Signed)
 Pt transported to CT from ER-2 then from CT to ICU-10 with no complications.

## 2023-06-10 NOTE — ED Notes (Addendum)
 EDP Bradler intubating at this time  75 22 at the teeth  good color change

## 2023-06-10 NOTE — Progress Notes (Signed)
 PHARMACY - ANTICOAGULATION CONSULT NOTE  Pharmacy Consult for heparin infusion Indication: chest pain/ACS  Allergies  Allergen Reactions   Penicillins Swelling    Tolerated ceftriaxone and cefepime in past per [facility] records    Patient Measurements: Height: 5\' 3"  (160 cm) Weight: 77.6 kg (171 lb 1.2 oz) IBW/kg (Calculated) : 52.4 Heparin Dosing Weight: 69 kg  Vital Signs: Temp: 99.5 F (37.5 C) (03/02 2325) Temp Source: Bladder (03/02 2000) BP: 119/81 (03/02 2325) Pulse Rate: 114 (03/02 2325)  Labs: Recent Labs    06/10/23 1129 06/10/23 1344 06/10/23 1600 06/10/23 1831 06/10/23 2248  HGB 15.2*  --   --   --   --   HCT 46.7*  --   --   --   --   PLT 236  --   --   --   --   APTT  --   --  37*  --   --   LABPROT  --   --  17.1*  --   --   INR  --   --  1.4*  --   --   HEPARINUNFRC  --   --   --   --  0.20*  CREATININE 1.62*  --   --  1.76*  --   CKTOTAL  --  481*  --   --   --   TROPONINIHS 968* 11,582*  --  >24,000*  --     Estimated Creatinine Clearance: 43.2 mL/min (A) (by C-G formula based on SCr of 1.76 mg/dL (H)).   Medical History: Past Medical History:  Diagnosis Date   Alcohol abuse    Anxiety    Biliary calculi 12/07/2009   Cholelithiasis    Depression    Diet-controlled diabetes mellitus (HCC)    GERD (gastroesophageal reflux disease)    Hepatitis C    body "self healed" per patient   LVH (left ventricular hypertrophy)    a. 10/2022 Echo: EF 60-65%, no rwma, sev conc LVH, nl RV size/fxn, triv MR. Mobile density near ant MV leaflet- felt to be redundant chordae tendinae..   Morbid obesity (HCC)    Palpitations    a. 07/2019 Holter: Sinus rhythm, PVCs, rare PACs.  No significant arrhythmias.   Pancreatic pseudocyst    a. 02/2023 MRI abd: improving unilocular pancreatic pseudocyst.   Retained intrauterine contraceptive device (IUD) 01/20/2020   Substance abuse (HCC)     Medications:  Scheduled:   [START ON 06/11/2023] Chlorhexidine  Gluconate Cloth  6 each Topical Q0600   fentaNYL (SUBLIMAZE) injection  50 mcg Intravenous Once   folic acid  1 mg Intravenous Daily   [START ON 06/11/2023] heparin  1,000 Units Intravenous Once   heparin  4,000 Units Intravenous Once   insulin aspart  0-9 Units Subcutaneous Q4H   lactulose  20 g Per Tube BID   multivitamin  15 mL Per Tube Daily   mouth rinse  15 mL Mouth Rinse Q2H   pantoprazole (PROTONIX) IV  40 mg Intravenous Q24H   [START ON 07/07/2023] thiamine (VITAMIN B1) injection  100 mg Intravenous Daily    Assessment: 38 yo female with a PMH of ETOH abuse, seizure activity secondary to ETOH withdrawal, and alcoholic liver cirrhosis. She is on no chronic anticoagulation PTA based on dispens history  Baseline Labs: Hgb15.2, PLT wnl, INR/aPTT pending  Goal of Therapy:  Heparin level 0.3-0.7 units/ml Monitor platelets by anticoagulation protocol: Yes   Plan:  3/2:  HL @ 2248 =  0.20, SUBtherapeutic - Will order heparin 1000 units IV X 1 bolus and increase drip rate to 1000 units/hr - Will recheck HL 6 hrs after rate change   Jerrald Doverspike D 06/10/2023,11:31 PM

## 2023-06-10 NOTE — Progress Notes (Signed)
 Pt transported to/from CT on Servo-air with no complications

## 2023-06-10 NOTE — H&P (Addendum)
 NAME:  Katie Stanley, MRN:  [id number], DOB:  01/10/1986, LOS: 0 ADMISSION DATE:  06/10/2023, CONSULTATION DATE: 06/10/23 REFERRING MD: Dr. Vicente Males, CHIEF COMPLAINT: Cardiac Arrest    History of Present Illness:  This is a 38 yo female with a PMH of ETOH abuse, seizure activity secondary to ETOH withdrawal, and alcoholic liver cirrhosis.  She presented to [facility] ER on 03/2 via EMS from home after pts family witnessed the pt having seizure-like activity (staring spell followed by abnormal respirations then tensing up with abnormal respirations followed by "shaking activity" resulting in tongue trauma) and subsequent loss of consciousness.  Per ER notes EMS reported pt v fib arrested requiring epi x4, amiodarone load, 2 amps of bicarb, and defibrillation x1 prior to ROSC after 23 minutes.  Pts significant other reported to EDP the pt has had heavy alcohol abuse over the past week with 1/2 pint of liquor daily.  She attempted to stop over the last few days and initially had seizure activity on 03/1.  Therefore, pt had an alcoholic beverage following the seizure activity in an attempt to prevent recurrent seizure activity.  However, the pt did not have an alcoholic beverage today.    ED Course  Upon arrival to the ER pt severely hypotensive despite IV fluids resuscitation requiring 1 amp of epi followed by levophed gtt.  Due to inability to protect airway pt mechanically intubated for airway protection.  EKG revealed sinus rhythm, hr 78 without signs of ST elevation.  CXR revealed no acute cardiopulmonary abnormality.  Significant lab results were: K+ 2.5/chloride 88/CO2 19/glucose 243/creatinine 1.62/ anion gap 32/lactic acid >9.0/wbc 17.3.  CT Head/Cervical Spine negative for acute intracranial abnormality.  Pt received 2 grams of iv keppra.  PCCM team contacted for ICU admission.   Micro Data  03/2: Resp panel by RT>>negative  03/2: Blood x2>> 03/2: Tracheal aspirate>> 03/2: Urine  culture>>  Anti-infectives (From admission, onward)    Start     Dose/Rate Route Frequency Ordered Stop   06/11/23 1200  vancomycin (VANCOREADY) IVPB 750 mg/150 mL        750 mg 150 mL/hr over 60 Minutes Intravenous Every 24 hours 06/10/23 1512     06/10/23 1800  metroNIDAZOLE (FLAGYL) IVPB 500 mg        500 mg 100 mL/hr over 60 Minutes Intravenous Every 12 hours 06/10/23 1506     06/10/23 1700  vancomycin (VANCOREADY) IVPB 1500 mg/300 mL        1,500 mg 150 mL/hr over 120 Minutes Intravenous  Once 06/10/23 1507     06/10/23 1600  cefTAZidime (FORTAZ) 2 g in sodium chloride 0.9 % 100 mL IVPB        2 g 200 mL/hr over 30 Minutes Intravenous Every 12 hours 06/10/23 1506         Pertinent  Medical History  ETOH Abuse  Polysubstance Abuse  Anxiety  Cholelithiasis  Depression  Type II Diabetes Mellitus  GERD  Hepatitis C  LVH Morbid Obesity  Palpitations  Pancreatic Pseudocyst  Significant Hospital Events: Including procedures, antibiotic start and stop dates in addition to other pertinent events   03/2: Admitted to ICU with hypotension (+/- septic and/or hypovolemic shock) acute encephalopathy following seizure-activity suspected secondary to ETOH withdrawal requiring mechanical intubation   Interim History / Subjective:  Pt currently requiring levophed gtt @18  mcg/min to maintain map 65 or higher  Objective   Blood pressure (!) 132/113, pulse (!) 105, resp. rate (!) 21, height 5\' 3"  (  1.6 m), weight 77.1 kg, SpO2 100%.    Vent Mode: PRVC FiO2 (%):  [80 %-100 %] 80 % Set Rate:  [18 bmp] 18 bmp Vt Set:  [500 mL] 500 mL PEEP:  [5 cmH20] 5 cmH20  No intake or output data in the 24 hours ending 06/10/23 1254 Filed Weights   06/10/23 1142  Weight: 77.1 kg   Examination: General: Acute on chronically-ill appearing female, NAD mechanically intubated  HENT: Supple, no JVD  Lungs: Faint rhonchi throughout, even, non labored  Cardiovascular: Sinus bradycardia, s1s2, no  m/r/g, 2+ radial/1+ distal pulses, no edema  Abdomen: +BS x4, obese, soft, non distended Extremities: Normal bulk and tone Neuro: Sedated, not following commands, PERRL  GU: Indwelling foley catheter draining yellow urine   Resolved Hospital Problem list     Assessment & Plan:   #Acute metabolic encephalopathy  #Possible anoxic injury  #Myoclonus vs. seizure activity  #Mechanical intubation pain/discomfort  Hx: Polysubstance abuse, anxiety, and depression - Correct metabolic derangements  - Avoid sedating medications as able  - CIWA protocol  - High dose thiamine x4 days, mvi, and folic acid  - Maintain RASS goal 0 to -1 - PAD protocol to maintain RASS goal: Versed and fentanyl gtts due to severe hypotension once bp improves will discontinue versed gtt and start propofol  - Urine drug screen, ammonia level, ethanol level, and acetaminophen level pending  - Seizure precautions - EEG in the am  - Neurology consulted appreciate input: keppra per recommendations   - Normothermia protocol   #Hypotension (+/- hypovolemic/septic shock) #Elevated troponin's suspect NSTEMI  #V fib arrest suspect secondary to electrolyte abnormalities  Echo 04/12/23: EF 55 to 60%; mild left ventricular hypertrophy - Continuous telemetry monitoring - Aggressive iv fluid resuscitation and prn levophed gtt to maintain map 65 or higher  - Trend troponin's until peaked  - Heparin gtt without bolus  - Cardiology consulted appreciate input  - Hold outpatient beta blocker and diuretic therapy   #Suspect aspiration  #Mechanical intubation  - Full vent support for now: vent settings reviewed and established  - Maintain plateau pressures 30 cm H2O - Continue lung protective strategies  - SBT once all parameters met  - Intermittent CXR's and ABG's pending   #Acute kidney injury secondary to ATN  #Hypokalemia  #Anion gap metabolic acidosis  #Severe lactic acidosis  - Trend BMP and lactic acid  - Replace  electrolytes as indicated  - Strict I&O's - Avoid nephrotoxic agents as able   #Possible sepsis  #Possible UTI  - Trend WBC and monitor fever curve  - Trend PCT  - Follow cultures  - Amylase and lipase pending  - CT Abd/Pelvis pending to r/o intraabdominal infection  - Empiric abx as outlined above   #Alcoholic cirrhosis  - Trend hepatic function panel  - Avoid hepatoxic agents   #Type II diabetes mellitus  - Hemoglobin A1C - CBG's q4hrs  - SSI  - Follow hypo/hyperglycemic protocol   Best Practice (right click and "Reselect all SmartList Selections" daily)   Diet/type: NPO DVT prophylaxis Heparin gtt Pressure ulcer(s): N/A GI prophylaxis: PPI Lines: N/A Foley:  Yes, and it is still needed Code Status:  full code Last date of multidisciplinary goals of care discussion [N/A]  Labs   CBC: Recent Labs  Lab 06/10/23 1129  WBC 17.3*  HGB 15.2*  HCT 46.7*  MCV 98.3  PLT 236    Basic Metabolic Panel: Recent Labs  Lab 06/10/23 1129  NA 139  K 2.5*  CL 88*  CO2 19*  GLUCOSE 243*  BUN 10  CREATININE 1.62*  CALCIUM 9.9   GFR: Estimated Creatinine Clearance: 46.8 mL/min (A) (by C-G formula based on SCr of 1.62 mg/dL (H)). Recent Labs  Lab 06/10/23 1129  WBC 17.3*  LATICACIDVEN >9.0*    Liver Function Tests: No results for input(s): "AST", "ALT", "ALKPHOS", "BILITOT", "PROT", "ALBUMIN" in the last 168 hours. No results for input(s): "LIPASE", "AMYLASE" in the last 168 hours. No results for input(s): "AMMONIA" in the last 168 hours.  ABG    Component Value Date/Time   PHART 7.28 (L) 06/10/2023 1146   PCO2ART 39 06/10/2023 1146   PO2ART 79 (L) 06/10/2023 1146   HCO3 18.3 (L) 06/10/2023 1146   ACIDBASEDEF 7.9 (H) 06/10/2023 1146   O2SAT 95.9 06/10/2023 1146     Coagulation Profile: No results for input(s): "INR", "PROTIME" in the last 168 hours.  Cardiac Enzymes: No results for input(s): "CKTOTAL", "CKMB", "CKMBINDEX", "TROPONINI" in the last 168  hours.  HbA1C: HB A1C (BAYER DCA - WAIVED)  Date/Time Value Ref Range Status  02/05/2023 03:03 PM 6.1 (H) 4.8 - 5.6 % Final    Comment:             Prediabetes: 5.7 - 6.4          Diabetes: >6.4          Glycemic control for adults with diabetes: <7.0   10/01/2018 04:30 PM 5.3 <7.0 % Final    Comment:                                          Diabetic Adult            <7.0                                       Healthy Adult        4.3 - 5.7                                                           (DCCT/NGSP) American Diabetes Association's Summary of Glycemic Recommendations for Adults with Diabetes: Hemoglobin A1c <7.0%. More stringent glycemic goals (A1c <6.0%) may further reduce complications at the cost of increased risk of hypoglycemia.    Hgb A1c MFr Bld  Date/Time Value Ref Range Status  10/27/2022 05:04 PM 6.6 (H) 4.8 - 5.6 % Final    Comment:    (NOTE)         Prediabetes: 5.7 - 6.4         Diabetes: >6.4         Glycemic control for adults with diabetes: <7.0   12/10/2019 02:17 PM 5.8 (H) 4.8 - 5.6 % Final    Comment:    (NOTE) Pre diabetes:          5.7%-6.4%  Diabetes:              >6.4%  Glycemic control for   <7.0% adults with diabetes     CBG: No results for input(s): "GLUCAP" in the last 168 hours.  Review  of Systems:   Unable to assess pt mechanically intubated   Past Medical History:  She,  has a past medical history of Alcohol abuse, Anxiety, Biliary calculi (12/07/2009), Cholelithiasis, Depression, Diet-controlled diabetes mellitus (HCC), GERD (gastroesophageal reflux disease), Hepatitis C, LVH (left ventricular hypertrophy), Morbid obesity (HCC), Palpitations, Pancreatic pseudocyst, Retained intrauterine contraceptive device (IUD) (01/20/2020), and Substance abuse (HCC).   Surgical History:   Past Surgical History:  Procedure Laterality Date   CHOLECYSTECTOMY     2011   COLONOSCOPY WITH PROPOFOL N/A 09/20/2022   Procedure: COLONOSCOPY  WITH PROPOFOL;  Surgeon: Midge Minium, MD;  Location: [facility] ENDOSCOPY;  Service: Endoscopy;  Laterality: N/A;   ESOPHAGOGASTRODUODENOSCOPY N/A 09/29/2021   Procedure: ESOPHAGOGASTRODUODENOSCOPY (EGD);  Surgeon: Midge Minium, MD;  Location[facility]COPY;  Service: Endoscopy;  Laterality: N/A;   ESOPHAGOGASTRODUODENOSCOPY (EGD) WITH PROPOFOL N/A 01/21/2018   Procedure: ESOPHAGOGASTRODUODENOSCOPY (EGD) WITH Biopsy;  Surgeon: Midge Minium, MD;  Location: [facility];  Service: Endoscopy;  Laterality: N/A;   HYSTEROSCOPY WITH D & C N/A 01/20/2020   Procedure: DILATATION AND CURETTAGE /HYSTEROSCOPY;  Surgeon: Conard Novak, MD;  Location: [facility] ORS;  Service: Gynecology;  Laterality: N/A;   INTRAUTERINE DEVICE (IUD) INSERTION N/A 01/20/2020   Procedure: INTRAUTERINE DEVICE (IUD) INSERTION MIRENA IUD;  Surgeon: Conard Novak, MD;  Location: [facility] ORS;  Service: Gynecology;  Laterality: N/A;   IUD REMOVAL N/A 01/20/2020   Procedure: INTRAUTERINE DEVICE (IUD) REMOVAL;  Surgeon: Conard Novak, MD;  Location: [facility] ORS;  Service: Gynecology;  Laterality: N/A;   PORT-A-CATH REMOVAL  02/08/2022   PORTA CATH INSERTION N/A 01/01/2019   Procedure: PORTA CATH INSERTION;  Surgeon: Annice Needy, MD;  Location: [facility] INVASIVE CV LAB;  Service: Cardiovascular;  Laterality: N/A;   VASCULAR SURGERY       Social History:   reports that she has been smoking cigarettes. She has a 12 pack-year smoking history. She has never used smokeless tobacco. She reports current alcohol use. She reports current drug use. Drug: Marijuana.   Family History:  Her family history includes Breast cancer in her paternal grandmother; Diabetes in her father; Heart disease in her paternal grandfather; Hypertension in her father, maternal grandfather, and mother; Liver cancer in her maternal grandmother.   Allergies Allergies  Allergen Reactions   Penicillins Swelling    Tolerated ceftriaxone and cefepime in past per  [facility] records     Home Medications  Prior to Admission medications   Medication Sig Start Date End Date Taking? Authorizing Provider  acetaminophen (TYLENOL) 500 MG tablet Take 1 tablet (500 mg total) by mouth 3 (three) times daily as needed for headache, fever or moderate pain (pain score 4-6). 04/18/23   Loyce Dys, MD  citalopram (CELEXA) 20 MG tablet TAKE 1.5 TABLET(20 MG) BY MOUTH DAILY 02/20/23   Johnson, Megan P, DO  famotidine (PEPCID) 20 MG tablet Take 20 mg by mouth 2 (two) times daily.    [provider]  fluticasone furoate-vilanterol (BREO ELLIPTA) 200-25 MCG/ACT AEPB Inhale 1 puff into the lungs daily. 04/19/23   Loyce Dys, MD  folic acid (FOLVITE) 1 MG tablet Take 1 tablet (1 mg total) by mouth daily. 02/13/23   Regalado, Belkys A, MD  furosemide (LASIX) 40 MG tablet Take 1 tablet (40 mg total) by mouth 2 (two) times daily. 05/08/23   Johnson, Megan P, DO  gabapentin (NEURONTIN) 300 MG capsule Take 1 capsule (300 mg total) by mouth at bedtime. Increase to twice daily if  able to tolerate without any drowsiness 06/05/23   Toney Reil, MD  Iron, Ferrous Sulfate, 325 (65 Fe) MG TABS Take 325 mg by mouth daily. 03/07/23   Johnson, Megan P, DO  lactulose (CHRONULAC) 10 GM/15ML solution TAKE 15 MLS BY MOUTH DAILY 05/23/23   Johnson, Megan P, DO  lidocaine (LIDODERM) 5 % Place 1 patch onto the skin daily. Remove & Discard patch within 12 hours or as directed by MD 04/28/22   Olevia Perches P, DO  MAGNESIUM PO Take 1 each by mouth in the morning and at bedtime. OTC patient is unsure of the dost    [provider]  methocarbamol (ROBAXIN) 500 MG tablet Take 1 tablet (500 mg total) by mouth 2 (two) times daily. 05/08/23   Olevia Perches P, DO  metoprolol succinate (TOPROL-XL) 25 MG 24 hr tablet Take 1 tablet (25 mg total) by mouth daily. 02/20/23   Olevia Perches P, DO  Multiple Vitamin (MULTIVITAMIN WITH MINERALS) TABS tablet Take 1 tablet by mouth daily.  02/13/23   Regalado, Belkys A, MD  pantoprazole (PROTONIX) 40 MG tablet Take 1 tablet (40 mg total) by mouth 2 (two) times daily before a meal. 05/08/23 06/07/23  Johnson, Megan P, DO  potassium chloride SA (KLOR-CON M) 20 MEQ tablet TAKE 1 TABLET(20 MEQ) BY MOUTH DAILY 03/20/23   Johnson, Megan P, DO  spironolactone (ALDACTONE) 50 MG tablet Take 1 tablet (50 mg total) by mouth daily. Patient not taking: Reported on 06/07/2023 05/08/23   Dorcas Carrow, DO     Critical care time: 54 minutes     Zada Girt, [facility]  Pulmonary/Critical Care Pager [phone] (please enter 7 digits) PCCM Consult Pager [phone] (please enter 7 digits)

## 2023-06-10 NOTE — ED Notes (Signed)
 70 roc given at this time by RN Erskine Squibb

## 2023-06-10 NOTE — Progress Notes (Addendum)
 22F with alcoholic cirrhosis, p/w cardiac arrest after EtOH withdrawal seizure. Frequent seizures on ceribell. Clinically having myoclonic seizures. She has been given Keppra 4500mg , on Versed at 60mr/hr. Staying away from propofol to avoid hypotension. There is room to go up on Versed. Will increase Versed to 20mg /hr and go up as needed. Seizure are clearly appreciable on Ceribell EEG and therefore dont think transfer for cEEG would be of added utility.  Will escalate Versed based on Ceribell EEG.  Plan discussed with [facility] NP over phone.  Erick Blinks [facility]

## 2023-06-10 NOTE — ED Notes (Signed)
 IV team at bedside

## 2023-06-10 NOTE — Progress Notes (Signed)
 Per order pop-up, initiate "TTM Post Arrest Cooling Protocol" and apply Arctic Sun cooling pads if patient reaches or exceeds 37.6 C. Currently, patient is 37.7 C. Per Webb Silversmith, NP, apply cooling blanket instead of Arctic Sun to maintain temp at ~37.0 C.

## 2023-06-10 NOTE — ED Notes (Signed)
 Norepi(Levo) started at 10 mcg/min

## 2023-06-10 NOTE — ED Triage Notes (Addendum)
 Pt comes unresponsive from home after seizure and post arrest. Pt shocked three times, 3 epi given, 1 bicarb and 1 calcium given. 450 amio given.  ROSC achieved after 23 minutes. CBG 161   Pt arrived with lucas in place but not going.   RT and EDP Bradler at bedside.  Pt had I gel in place upon arrival, RN IO in place to right, fluids infusing at this time

## 2023-06-10 NOTE — Progress Notes (Signed)
 Critical Troponin value received. Provider and primary nurse notified. Provider  and primary nurse at bedside.

## 2023-06-10 NOTE — ED Notes (Signed)
 MD Bradler informed of critical lab of lactic and K+

## 2023-06-10 NOTE — ED Notes (Signed)
 MD Bradler informed of trop of [phone]

## 2023-06-11 ENCOUNTER — Inpatient Hospital Stay
Admit: 2023-06-11 | Discharge: 2023-06-11 | Disposition: A | Discharge status: Home / Self Care | Attending: Cardiovascular Disease | Admitting: Cardiovascular Disease

## 2023-06-11 ENCOUNTER — Inpatient Hospital Stay

## 2023-06-11 ENCOUNTER — Encounter: Payer: Self-pay | Admitting: Family Medicine

## 2023-06-11 DIAGNOSIS — A419 Sepsis, unspecified organism: Secondary | ICD-10-CM | POA: Diagnosis not present

## 2023-06-11 DIAGNOSIS — J9601 Acute respiratory failure with hypoxia: Secondary | ICD-10-CM | POA: Diagnosis not present

## 2023-06-11 DIAGNOSIS — I214 Non-ST elevation (NSTEMI) myocardial infarction: Secondary | ICD-10-CM | POA: Diagnosis not present

## 2023-06-11 DIAGNOSIS — R569 Unspecified convulsions: Secondary | ICD-10-CM | POA: Diagnosis not present

## 2023-06-11 DIAGNOSIS — R570 Cardiogenic shock: Secondary | ICD-10-CM | POA: Diagnosis not present

## 2023-06-11 DIAGNOSIS — G931 Anoxic brain damage, not elsewhere classified: Secondary | ICD-10-CM | POA: Diagnosis not present

## 2023-06-11 DIAGNOSIS — F10130 Alcohol abuse with withdrawal, uncomplicated: Secondary | ICD-10-CM

## 2023-06-11 DIAGNOSIS — I469 Cardiac arrest, cause unspecified: Secondary | ICD-10-CM | POA: Diagnosis not present

## 2023-06-11 LAB — RESPIRATORY PANEL BY PCR

## 2023-06-11 LAB — HEPARIN LEVEL (UNFRACTIONATED)
Heparin Unfractionated: 0.27 [IU]/mL — ABNORMAL LOW (ref 0.30–0.70)
Heparin Unfractionated: 0.26 [IU]/mL — ABNORMAL LOW (ref 0.30–0.70)
Heparin Unfractionated: 0.35 [IU]/mL (ref 0.30–0.70)

## 2023-06-11 LAB — BASIC METABOLIC PANEL
Anion gap: 18 — ABNORMAL HIGH (ref 5–15)
Anion gap: 16 — ABNORMAL HIGH (ref 5–15)
BUN: 13 mg/dL (ref 6–20)
BUN: 15 mg/dL (ref 6–20)
CO2: 23 mmol/L (ref 22–32)
CO2: 27 mmol/L (ref 22–32)
Calcium: 8.5 mg/dL — ABNORMAL LOW (ref 8.9–10.3)
Calcium: 8.8 mg/dL — ABNORMAL LOW (ref 8.9–10.3)
Chloride: 96 mmol/L — ABNORMAL LOW (ref 98–111)
Chloride: 97 mmol/L — ABNORMAL LOW (ref 98–111)
Creatinine, Ser: 1.78 mg/dL — ABNORMAL HIGH (ref 0.44–1.00)
Creatinine, Ser: 1.6 mg/dL — ABNORMAL HIGH (ref 0.44–1.00)
GFR, Estimated: 37 mL/min — ABNORMAL LOW (ref >60)
GFR, Estimated: 42 mL/min — ABNORMAL LOW (ref >60)
Glucose, Bld: 146 mg/dL — ABNORMAL HIGH (ref 70–99)
Glucose, Bld: 94 mg/dL (ref 70–99)
Potassium: 3 mmol/L — ABNORMAL LOW (ref 3.5–5.1)
Potassium: 3 mmol/L — ABNORMAL LOW (ref 3.5–5.1)
Sodium: 137 mmol/L (ref 135–145)
Sodium: 140 mmol/L (ref 135–145)

## 2023-06-11 LAB — BLOOD GAS, ARTERIAL
Acid-Base Excess: 4.1 mmol/L — ABNORMAL HIGH (ref 0.0–2.0)
Bicarbonate: 27.4 mmol/L (ref 20.0–28.0)
MECHVT: 500 mL
O2 Saturation: 93 %
PEEP: 5 cmH2O
Patient temperature: 37
RATE: 18 {breaths}/min
pCO2 arterial: 36 mmHg (ref 32–48)
pH, Arterial: 7.49 — ABNORMAL HIGH (ref 7.35–7.45)
pO2, Arterial: 70 mmHg — ABNORMAL LOW (ref 83–108)

## 2023-06-11 LAB — CBC
HCT: 41.6 % (ref 36.0–46.0)
Hemoglobin: 13.6 g/dL (ref 12.0–15.0)
MCH: 32.1 pg (ref 26.0–34.0)
MCHC: 32.7 g/dL (ref 30.0–36.0)
MCV: 98.1 fL (ref 80.0–100.0)
Platelets: 220 10*3/uL (ref 150–400)
RBC: 4.24 MIL/uL (ref 3.87–5.11)
RDW: 17.1 % — ABNORMAL HIGH (ref 11.5–15.5)
WBC: 19 10*3/uL — ABNORMAL HIGH (ref 4.0–10.5)
nRBC: 0 % (ref 0.0–0.2)

## 2023-06-11 LAB — MAGNESIUM
Magnesium: 2.2 mg/dL (ref 1.7–2.4)
Magnesium: 2 mg/dL (ref 1.7–2.4)

## 2023-06-11 LAB — GLUCOSE, CAPILLARY
Glucose-Capillary: 127 mg/dL — ABNORMAL HIGH (ref 70–99)
Glucose-Capillary: 123 mg/dL — ABNORMAL HIGH (ref 70–99)
Glucose-Capillary: 95 mg/dL (ref 70–99)
Glucose-Capillary: 95 mg/dL (ref 70–99)
Glucose-Capillary: 80 mg/dL (ref 70–99)
Glucose-Capillary: 91 mg/dL (ref 70–99)

## 2023-06-11 LAB — PHOSPHORUS
Phosphorus: 4.6 mg/dL (ref 2.5–4.6)
Phosphorus: 3.4 mg/dL (ref 2.5–4.6)

## 2023-06-11 LAB — LACTIC ACID, PLASMA
Lactic Acid, Venous: 4.5 mmol/L (ref 0.5–1.9)
Lactic Acid, Venous: 3 mmol/L (ref 0.5–1.9)

## 2023-06-11 LAB — TRIGLYCERIDES: Triglycerides: 75 mg/dL (ref <150)

## 2023-06-11 MED ORDER — HEPARIN BOLUS VIA INFUSION
1050.0000 [IU] | Freq: Once | INTRAVENOUS | Status: AC
  Administered 2023-06-11: 1050 [IU] via INTRAVENOUS
  Filled 2023-06-11: qty 1050

## 2023-06-11 MED ORDER — LEVETIRACETAM IN NACL 1000 MG/100ML IV SOLN
1000.0000 mg | Freq: Two times a day (BID) | INTRAVENOUS | Status: DC
  Administered 2023-06-11 – 2023-06-14 (×7): 1000 mg via INTRAVENOUS
  Filled 2023-06-11 (×8): qty 100

## 2023-06-11 MED ORDER — VITAL HIGH PROTEIN PO LIQD: 1000.0000 mL | ORAL | Status: DC

## 2023-06-11 MED ORDER — LACTULOSE 10 GM/15ML PO SOLN
30.0000 g | Freq: Three times a day (TID) | ORAL | Status: DC
  Administered 2023-06-11 – 2023-06-12 (×4): 30 g
  Filled 2023-06-11 (×4): qty 60

## 2023-06-11 MED ORDER — PROSOURCE TF20 ENFIT COMPATIBL EN LIQD
60.0000 mL | Freq: Two times a day (BID) | ENTERAL | Status: DC
  Administered 2023-06-11 – 2023-06-13 (×5): 60 mL

## 2023-06-11 MED ORDER — FREE WATER
30.0000 mL | Status: DC
  Administered 2023-06-11 – 2023-06-14 (×16): 30 mL

## 2023-06-11 MED ORDER — POTASSIUM CHLORIDE 10 MEQ/100ML IV SOLN
10.0000 meq | INTRAVENOUS | Status: AC
  Administered 2023-06-11 (×6): 10 meq via INTRAVENOUS
  Filled 2023-06-11 (×6): qty 100

## 2023-06-11 MED ORDER — VITAL 1.5 CAL PO LIQD
1000.0000 mL | ORAL | Status: DC
  Administered 2023-06-11 – 2023-06-12 (×2): 1000 mL

## 2023-06-11 NOTE — Progress Notes (Signed)
 NAME:  Katie Stanley, MRN:  [id number], DOB:  [birth year], LOS: 1 ADMISSION DATE:  06/10/2023, CONSULTATION DATE:  06/10/23 REFERRING MD:  Dr. Vicente Males, CHIEF COMPLAINT:  Cardiac Arrest   Brief Pt Description / Synopsis:  38 y.o. female with PMHx significant for ETOH abuse, ETOH withdrawal seizures and alcoholic liver cirrhosis admitted following out-of-hospital V. Fib Cardiac Arrest (approximately 25 minutes of ACLS prior to ROSC), NSTEMI, multifactorial Shock, suspected aspiration pneumonia, AKI, and severe myoclonus with concern for possible anoxic brain injury.  History of Present Illness:  This is a 38 yo female with a PMH of ETOH abuse, seizure activity secondary to ETOH withdrawal, and alcoholic liver cirrhosis.  She presented to [facility] ER on 03/2 via EMS from home after pts family witnessed the pt having seizure-like activity (staring spell followed by abnormal respirations then tensing up with abnormal respirations followed by "shaking activity" resulting in tongue trauma) and subsequent loss of consciousness.  Per ER notes EMS reported pt v fib arrested requiring epi x4, amiodarone load, 2 amps of bicarb, and defibrillation x1 prior to ROSC after 23 minutes.  Pts significant other reported to EDP the pt has had heavy alcohol abuse over the past week with 1/2 pint of liquor daily.  She attempted to stop over the last few days and initially had seizure activity on 03/1.  Therefore, pt had an alcoholic beverage following the seizure activity in an attempt to prevent recurrent seizure activity.  However, the pt did not have an alcoholic beverage today.     ED Course  Upon arrival to the ER pt severely hypotensive despite IV fluids resuscitation requiring 1 amp of epi followed by levophed gtt.  Due to inability to protect airway pt mechanically intubated for airway protection.  EKG revealed sinus rhythm, hr 78 without signs of ST elevation.  CXR revealed no acute cardiopulmonary abnormality.  Significant  lab results were: K+ 2.5/chloride 88/CO2 19/glucose 243/creatinine 1.62/ anion gap 32/lactic acid >9.0/wbc 17.3.  CT Head/Cervical Spine negative for acute intracranial abnormality.  Pt received 2 grams of iv keppra.    PCCM asked to admit for further workup and treatment.  Please see "Significant Hospital Events" section below for full detailed hospital course.   Pertinent  Medical History   Past Medical History:  Diagnosis Date   Alcohol abuse    Anxiety    Biliary calculi 12/07/2009   Cholelithiasis    Depression    Diet-controlled diabetes mellitus (HCC)    GERD (gastroesophageal reflux disease)    Hepatitis C    body "self healed" per patient   LVH (left ventricular hypertrophy)    a. 10/2022 Echo: EF 60-65%, no rwma, sev conc LVH, nl RV size/fxn, triv MR. Mobile density near ant MV leaflet- felt to be redundant chordae tendinae..   Morbid obesity (HCC)    Palpitations    a. 07/2019 Holter: Sinus rhythm, PVCs, rare PACs.  No significant arrhythmias.   Pancreatic pseudocyst    a. 02/2023 MRI abd: improving unilocular pancreatic pseudocyst.   Retained intrauterine contraceptive device (IUD) 01/20/2020   Substance abuse (HCC)     Micro Data:  03/2: Resp panel by RT>>negative  03/2: Blood x2>> no growth to date 03/2: MRSA PCR>> negative 03/2: Tracheal aspirate>> gram + cocci & gram - rods 03/2: Urine culture>>  Antimicrobials:   Anti-infectives (From admission, onward)    Start     Dose/Rate Route Frequency Ordered Stop   06/11/23 1200  vancomycin (VANCOREADY) IVPB 750 mg/150 mL  Status:  Discontinued        750 mg 150 mL/hr over 60 Minutes Intravenous Every 24 hours 06/10/23 1512 06/11/23 1036   06/10/23 1800  metroNIDAZOLE (FLAGYL) IVPB 500 mg        500 mg 100 mL/hr over 60 Minutes Intravenous Every 12 hours 06/10/23 1506     06/10/23 1700  vancomycin (VANCOREADY) IVPB 1500 mg/300 mL        1,500 mg 150 mL/hr over 120 Minutes Intravenous  Once 06/10/23 1507  06/10/23 2331   06/10/23 1600  cefTAZidime (FORTAZ) 2 g in sodium chloride 0.9 % 100 mL IVPB        2 g 200 mL/hr over 30 Minutes Intravenous Every 12 hours 06/10/23 1506         Significant Hospital Events: Including procedures, antibiotic start and stop dates in addition to other pertinent events   03/2: Admitted to ICU with hypotension (+/- septic and/or hypovolemic shock) acute encephalopathy following seizure-activity suspected secondary to ETOH withdrawal requiring mechanical intubation  03/03: Overnight with severe myoclonus requiring increasing Versed gtt to 20 mg/hr, will attempt to transition to Propofol gtt.  Critically ill with multiorgan failure.  Neurology following, plan for repeat EEG and MRI Brain today.  Cardiology consulted for NSTEMI.  May require transfer to [facility] for continuous EEG based on EEG results.  Interim History / Subjective:  -Heavily sedated On versed drip ~ No spontaneous limb movements but spontaneously has myoclonic appearing eye opening movements, which are more prominent with stimulation-verbal or tactile.  -No cough/gag/corneal reflexes elicited  Objective   Blood pressure (!) 119/95, pulse (!) 129, temperature 98.4 F (36.9 C), temperature source Bladder, resp. rate 18, height 5\' 3"  (1.6 m), weight 76.1 kg, SpO2 98%. CVP:  [6 mmHg-16 mmHg] 11 mmHg  Vent Mode: PRVC FiO2 (%):  [30 %-80 %] 30 % Set Rate:  [18 bmp] 18 bmp Vt Set:  [500 mL] 500 mL PEEP:  [5 cmH20] 5 cmH20 Plateau Pressure:  [15 cmH20-21 cmH20] 21 cmH20   Intake/Output Summary (Last 24 hours) at 06/11/2023 1247 Last data filed at 06/11/2023 1206 Gross per 24 hour  Intake 4522.88 ml  Output 825 ml  Net 3697.88 ml   Filed Weights   06/10/23 1142 06/10/23 1521 06/11/23 0315  Weight: 77.1 kg 77.6 kg 76.1 kg    Examination: General: Critically on chronically-ill appearing female, NAD mechanically intubated and sedated HENT: Supple, no JVD, orally intubated Lungs: Coarse breath  sounds throughout, even, non labored, synchronous with vent Cardiovascular: Tachycardia, regular rhythm, s1s2, no m/r/g, 2+ radial/1+ distal pulses Abdomen: +BS x4, obese, soft, non distended Extremities: Normal bulk and tone, no deformities, trace edema bilateral LE Neuro: Sedated, not following commands, PERRL  GU: Indwelling foley catheter draining yellow urine   Resolved Hospital Problem list     Assessment & Plan:   #Out-of-Hospital V. Fib Cardiac arrest, suspect secondary to electrolyte abnormalities vs seizures #NSTEMI #Multifactorial Shock: Hypovolemic +/- Cardiogenic +/- Septic Echocardiogram 04/12/23: LVEF 55 to 60%, mild LVH -Continuous cardiac monitoring -Maintain MAP >65 -Cautious IV fluids -Vasopressors as needed to maintain MAP goal -Trend lactic acid until normalized -Trend HS Troponin until peaked -Repeat Echocardiogram pending -Cardiology following, appreciate input -Continue Heparin gtt for 48 hrs duration  #Acute Hypoxic Respiratory Failure #Suspected Aspiration Pneumonia -Full vent support, implement lung protective strategies -Plateau pressures less than 30 cm H20 -Wean FiO2 & PEEP as tolerated to maintain O2 sats >92% -Follow intermittent Chest X-ray & ABG as needed -  Spontaneous Breathing Trials when respiratory parameters met and mental status permits -Implement VAP Bundle -Prn Bronchodilators -ABX as above  #Sepsis due to ... #Suspected Aspiration Pneumonia -Monitor fever curve -Trend WBC's & Procalcitonin -Follow cultures as above -Continue empiric Ceftazidime and Flagyl (d/c Vancomycin) pending cultures & sensitivities  #Acute kidney injury secondary to ATN  #Hypokalemia  #Anion gap metabolic acidosis  #Severe lactic acidosis  -Monitor I&O's / urinary output -Follow BMP -Ensure adequate renal perfusion -Avoid nephrotoxic agents as able -Replace electrolytes as indicated ~ Pharmacy following for assistance with electrolyte  replacement  #Alcoholic cirrhosis  -Trend hepatic function panel and coags -Avoid hepatoxic agents    #Type II diabetes mellitus  -Check Hemoglobin A1C -CBG's q4h; Target range of 140 to 180 -SSI -Follow ICU Hypo/Hyperglycemia protocol  #Acute metabolic encephalopathy  #Concern for possible anoxic injury  #Myoclonus vs. seizure activity (alcohol withdrawal)  #Mechanical intubation pain/discomfort  Hx: Polysubstance abuse, anxiety, and depression -Treatment of metabolic derangements as outlined above -Normothermia protocol -Maintain a RASS goal of 0 to -1 -Fentanyl and Versed as needed to maintain RASS goal ~ plan to transition from versed to Propofol -Avoid sedating medications as able -Daily wake up assessment -Neurology following, appreciate input -EEG is pending -Obtain MRI Brain -Increase Keppra to 1g BID -May ultimately require transfer to [facility] for continuous EEG       Pt is critically ill with multiorgan failure. Prognosis is guarded, high risk for further decompensation, cardiac arrest, and death.  Prognosis is guarded.  Given current critical illness superimposed on prolonged cardiac arrest with concern for anoxic brain injury and multiple chronic co-morbidities, overall long term prognosis is poor.  Recommend consideration of DNR/DNI status.  Will consult Palliative Care to assist with GOC discussions depending on clinical course.  Best Practice (right click and "Reselect all SmartList Selections" daily)   Diet/type: NPO, start tube feeds DVT prophylaxis: systemic heparin GI prophylaxis: PPI Lines: Central line and yes and it is still needed Foley:  Yes, and it is still needed Code Status:  full code Last date of multidisciplinary goals of care discussion [3/3]  3/3: Will update family when they arrive at bedside on plan of care.  Labs   CBC: Recent Labs  Lab 06/10/23 1129 06/11/23 0351  WBC 17.3* 19.0*  HGB 15.2* 13.6  HCT 46.7* 41.6  MCV 98.3  98.1  PLT 236 220    Basic Metabolic Panel: Recent Labs  Lab 06/10/23 1129 06/10/23 1447 06/10/23 1831 06/10/23 2139 06/11/23 0351  NA 139  --  140  --  137  K 2.5*  --  3.2* 3.3* 3.0*  CL 88*  --  94*  --  96*  CO2 19*  --  22  --  23  GLUCOSE 243*  --  134*  --  146*  BUN 10  --  11  --  13  CREATININE 1.62*  --  1.76*  --  1.78*  CALCIUM 9.9  --  8.6*  --  8.5*  MG  --  1.5*  --  2.3 2.2  PHOS  --  7.3*  --   --  4.6   GFR: Estimated Creatinine Clearance: 42.3 mL/min (A) (by C-G formula based on SCr of 1.78 mg/dL (H)). Recent Labs  Lab 06/10/23 1129 06/10/23 1306 06/10/23 1447 06/10/23 1730 06/10/23 2006 06/11/23 0351 06/11/23 0810 06/11/23 1135  PROCALCITON  --   --  0.78  --   --   --   --   --  WBC 17.3*  --   --   --   --  19.0*  --   --   LATICACIDVEN >9.0*   < >  --  >9.0* >9.0*  --  4.5* 3.0*   < > = values in this interval not displayed.    Liver Function Tests: Recent Labs  Lab 06/10/23 1156  AST 226*  ALT 38  ALKPHOS 263*  BILITOT 3.4*  PROT 6.9  ALBUMIN 2.7*   Recent Labs  Lab 06/10/23 1344  LIPASE 69*  AMYLASE 194*   Recent Labs  Lab 06/10/23 1447  AMMONIA 218*    ABG    Component Value Date/Time   PHART 7.49 (H) 06/11/2023 0829   PCO2ART 36 06/11/2023 0829   PO2ART 70 (L) 06/11/2023 0829   HCO3 27.4 06/11/2023 0829   ACIDBASEDEF 7.9 (H) 06/10/2023 1146   O2SAT 93 06/11/2023 0829     Coagulation Profile: Recent Labs  Lab 06/10/23 1600  INR 1.4*    Cardiac Enzymes: Recent Labs  Lab 06/10/23 1344  CKTOTAL 481*    HbA1C: HB A1C (BAYER DCA - WAIVED)  Date/Time Value Ref Range Status  02/05/2023 03:03 PM 6.1 (H) 4.8 - 5.6 % Final    Comment:             Prediabetes: 5.7 - 6.4          Diabetes: >6.4          Glycemic control for adults with diabetes: <7.0   10/01/2018 04:30 PM 5.3 <7.0 % Final    Comment:                                          Diabetic Adult            <7.0                                        Healthy Adult        4.3 - 5.7                                                           (DCCT/NGSP) American Diabetes Association's Summary of Glycemic Recommendations for Adults with Diabetes: Hemoglobin A1c <7.0%. More stringent glycemic goals (A1c <6.0%) may further reduce complications at the cost of increased risk of hypoglycemia.    Hgb A1c MFr Bld  Date/Time Value Ref Range Status  06/10/2023 02:47 PM 5.3 4.8 - 5.6 % Final    Comment:    (NOTE) Pre diabetes:          5.7%-6.4%  Diabetes:              >6.4%  Glycemic control for   <7.0% adults with diabetes   10/27/2022 05:04 PM 6.6 (H) 4.8 - 5.6 % Final    Comment:    (NOTE)         Prediabetes: 5.7 - 6.4         Diabetes: >6.4         Glycemic control for adults with diabetes: <7.0     CBG: Recent Labs  Lab 06/10/23 1924 06/10/23 2318 06/11/23 0316 06/11/23 0717 06/11/23 1110  GLUCAP 121* 137* 127* 123* 95    Review of Systems:   Unable to assess due to AMS/sedation/intubation/critical illness   Past Medical History:  She,  has a past medical history of Alcohol abuse, Anxiety, Biliary calculi (12/07/2009), Cholelithiasis, Depression, Diet-controlled diabetes mellitus (HCC), GERD (gastroesophageal reflux disease), Hepatitis C, LVH (left ventricular hypertrophy), Morbid obesity (HCC), Palpitations, Pancreatic pseudocyst, Retained intrauterine contraceptive device (IUD) (01/20/2020), and Substance abuse (HCC).   Surgical History:   Past Surgical History:  Procedure Laterality Date   CHOLECYSTECTOMY     2011   COLONOSCOPY WITH PROPOFOL N/A 09/20/2022   Procedure: COLONOSCOPY WITH PROPOFOL;  Surgeon: Midge Minium, MD;  Location: [facility] ENDOSCOPY;  Service: Endoscopy;  Laterality: N/A;   ESOPHAGOGASTRODUODENOSCOPY N/A 09/29/2021   Procedure: ESOPHAGOGASTRODUODENOSCOPY (EGD);  Surgeon: Midge Minium, MD;  Location: [facility] ENDOSCOPY;  Service: Endoscopy;  Laterality: N/A;   ESOPHAGOGASTRODUODENOSCOPY (EGD)  WITH PROPOFOL N/A 01/21/2018   Procedure: ESOPHAGOGASTRODUODENOSCOPY (EGD) WITH Biopsy;  Surgeon: Midge Minium, MD;  Location: [facility];  Service: Endoscopy;  Laterality: N/A;   HYSTEROSCOPY WITH D & C N/A 01/20/2020   Procedure: DILATATION AND CURETTAGE /HYSTEROSCOPY;  Surgeon: Conard Novak, MD;  Location: [facility] ORS;  Service: Gynecology;  Laterality: N/A;   INTRAUTERINE DEVICE (IUD) INSERTION N/A 01/20/2020   Procedure: INTRAUTERINE DEVICE (IUD) INSERTION MIRENA IUD;  Surgeon: Conard Novak, MD;  Location: [facility] ORS;  Service: Gynecology;  Laterality: N/A;   IUD REMOVAL N/A 01/20/2020   Procedure: INTRAUTERINE DEVICE (IUD) REMOVAL;  Surgeon: Conard Novak, MD;  Location: [facility] ORS;  Service: Gynecology;  Laterality: N/A;   PORT-A-CATH REMOVAL  02/08/2022   PORTA CATH INSERTION N/A 01/01/2019   Procedure: PORTA CATH INSERTION;  Surgeon: Annice Needy, MD;  Location: [facility] INVASIVE CV LAB;  Service: Cardiovascular;  Laterality: N/A;   VASCULAR SURGERY       Social History:   reports that she has been smoking cigarettes. She has a 12 pack-year smoking history. She has never used smokeless tobacco. She reports current alcohol use. She reports current drug use. Drug: Marijuana.   Family History:  Her family history includes Breast cancer in her paternal grandmother; Diabetes in her father; Heart disease in her paternal grandfather; Hypertension in her father, maternal grandfather, and mother; Liver cancer in her maternal grandmother.   Allergies Allergies  Allergen Reactions   Penicillins Swelling    Tolerated ceftriaxone and cefepime in past per [facility] and [facility] records     Home Medications  Prior to Admission medications   Medication Sig Start Date End Date Taking? Authorizing Provider  acetaminophen (TYLENOL) 500 MG tablet Take 1 tablet (500 mg total) by mouth 3 (three) times daily as needed for headache, fever or moderate pain (pain score 4-6). 04/18/23  Yes Loyce Dys, MD  citalopram (CELEXA) 20 MG tablet TAKE 1.5 TABLET(20 MG) BY MOUTH DAILY 02/20/23  Yes Johnson, Megan P, DO  famotidine (PEPCID) 20 MG tablet Take 20 mg by mouth 2 (two) times daily.   Yes [provider]  fluticasone furoate-vilanterol (BREO ELLIPTA) 200-25 MCG/ACT AEPB Inhale 1 puff into the lungs daily. 04/19/23  Yes Loyce Dys, MD  furosemide (LASIX) 40 MG tablet Take 1 tablet (40 mg total) by mouth 2 (two) times daily. 05/08/23  Yes Johnson, Megan P, DO  gabapentin (NEURONTIN) 300 MG capsule Take 1 capsule (300 mg total) by mouth at bedtime. Increase to twice daily if  able to tolerate without any drowsiness 06/05/23  Yes Vanga, Loel Dubonnet, MD  Iron, Ferrous Sulfate, 325 (65 Fe) MG TABS Take 325 mg by mouth daily. 03/07/23  Yes Johnson, Megan P, DO  lactulose (CHRONULAC) 10 GM/15ML solution TAKE 15 MLS BY MOUTH DAILY 05/23/23  Yes Johnson, Megan P, DO  lidocaine (LIDODERM) 5 % Place 1 patch onto the skin daily. Remove & Discard patch within 12 hours or as directed by MD 04/28/22  Yes Johnson, Megan P, DO  MAGNESIUM PO Take 1 each by mouth in the morning and at bedtime. OTC patient is unsure of the dost   Yes [provider]  methocarbamol (ROBAXIN) 500 MG tablet Take 1 tablet (500 mg total) by mouth 2 (two) times daily. 05/08/23  Yes Johnson, Megan P, DO  metoprolol succinate (TOPROL-XL) 25 MG 24 hr tablet Take 1 tablet (25 mg total) by mouth daily. 02/20/23  Yes Johnson, Megan P, DO  Multiple Vitamin (MULTIVITAMIN WITH MINERALS) TABS tablet Take 1 tablet by mouth daily. 02/13/23  Yes Regalado, Belkys A, MD  pantoprazole (PROTONIX) 40 MG tablet Take 1 tablet (40 mg total) by mouth 2 (two) times daily before a meal. 05/08/23 06/10/23 Yes Johnson, Megan P, DO  potassium chloride SA (KLOR-CON M) 20 MEQ tablet TAKE 1 TABLET(20 MEQ) BY MOUTH DAILY 03/20/23  Yes Johnson, Megan P, DO  folic acid (FOLVITE) 1 MG tablet Take 1 tablet (1 mg total) by mouth daily. Patient not taking:  Reported on 06/10/2023 02/13/23   Hartley Barefoot A, MD  spironolactone (ALDACTONE) 50 MG tablet Take 1 tablet (50 mg total) by mouth daily. Patient not taking: Reported on 06/07/2023 05/08/23   Dorcas Carrow, DO     Critical care time: 45 minutes     Harlon Ditty, AGACNP-BC [facility] Prefer epic messenger for cross cover needs If after hours, please call E-link

## 2023-06-11 NOTE — Consult Note (Signed)
 NEUROLOGY CONSULT NOTE   Date of service: June 11, 2023 Patient Name: Katie Stanley MRN:  [id number] DOB:  1985-11-23 Chief Complaint: prognostication after cardiac arrest Requesting Provider: Vida Rigger, MD  History of Present Illness   This is a 38 year old woman with past medical history significant for alcohol abuse complicated by withdrawal seizures who presents after V-fib arrest after witnessed seizure activity secondary to alcohol withdrawal.  She also has a history of alcoholic liver cirrhosis.  She presented to the ED today after patient's family witnessed the patient having a staring spell followed by tensing up and shaking activity "all over the place" resulting in tongue trauma and subsequent loss of consciousness.  EMS found her to be in V-fib arrest requiring epi x 4, amiodarone load, 2 A of bicarb, and defibrillation x 1.  ROSC was obtained after 23 minutes.  Patient has a significant history of heavy alcohol abuse and generally ingests half a pint of liquor daily.  She attempted to stop abruptly and had a seizure on Friday which she treated with further alcohol ingestion.  She has a remote history of PEA arrest secondary to alcohol withdrawal seizures.  CT head showed no acute intracranial process.  Patient had severe hypotension requiring Levophed.  She had at some activity that was concerning for seizure and he received 4.5g of Keppra.  This activity evolved and declared itself as myoclonus which was initially stimulus induced and was now continuous.  Cerebral rapid EEG was conducted which showed epileptiform burst at approximately 10-second intervals.  Patient's next of kin is mother who ICU was unable to unable to get in touch with today; she also has a fiance.   ROS   UTA 2/2 coma  Past History   Past Medical History:  Diagnosis Date   Alcohol abuse    Anxiety    Biliary calculi 12/07/2009   Cholelithiasis    Depression    Diet-controlled diabetes mellitus (HCC)     GERD (gastroesophageal reflux disease)    Hepatitis C    body "self healed" per patient   LVH (left ventricular hypertrophy)    a. 10/2022 Echo: EF 60-65%, no rwma, sev conc LVH, nl RV size/fxn, triv MR. Mobile density near ant MV leaflet- felt to be redundant chordae tendinae..   Morbid obesity (HCC)    Palpitations    a. 07/2019 Holter: Sinus rhythm, PVCs, rare PACs.  No significant arrhythmias.   Pancreatic pseudocyst    a. 02/2023 MRI abd: improving unilocular pancreatic pseudocyst.   Retained intrauterine contraceptive device (IUD) 01/20/2020   Substance abuse (HCC)     Past Surgical History:  Procedure Laterality Date   CHOLECYSTECTOMY     2011   COLONOSCOPY WITH PROPOFOL N/A 09/20/2022   Procedure: COLONOSCOPY WITH PROPOFOL;  Surgeon: Midge Minium, MD;  Location: [facility] ENDOSCOPY;  Service: Endoscopy;  Laterality: N/A;   ESOPHAGOGASTRODUODENOSCOPY N/A 09/29/2021   Procedure: ESOPHAGOGASTRODUODENOSCOPY (EGD);  Surgeon: Midge Minium, MD;  Location: [facility] ENDOSCOPY;  Service: Endoscopy;  Laterality: N/A;   ESOPHAGOGASTRODUODENOSCOPY (EGD) WITH PROPOFOL N/A 01/21/2018   Procedure: ESOPHAGOGASTRODUODENOSCOPY (EGD) WITH Biopsy;  Surgeon: Midge Minium, MD;  Location: [facility];  Service: Endoscopy;  Laterality: N/A;   HYSTEROSCOPY WITH D & C N/A 01/20/2020   Procedure: DILATATION AND CURETTAGE /HYSTEROSCOPY;  Surgeon: Conard Novak, MD;  Location: [facility] ORS;  Service: Gynecology;  Laterality: N/A;   INTRAUTERINE DEVICE (IUD) INSERTION N/A 01/20/2020   Procedure: INTRAUTERINE DEVICE (IUD) INSERTION MIRENA IUD;  Surgeon: Conard Novak,  MD;  Location: [facility] ORS;  Service: Gynecology;  Laterality: N/A;   IUD REMOVAL N/A 01/20/2020   Procedure: INTRAUTERINE DEVICE (IUD) REMOVAL;  Surgeon: Conard Novak, MD;  Location: [facility] ORS;  Service: Gynecology;  Laterality: N/A;   PORT-A-CATH REMOVAL  02/08/2022   PORTA CATH INSERTION N/A 01/01/2019   Procedure: PORTA CATH  INSERTION;  Surgeon: Annice Needy, MD;  Location: [facility] INVASIVE CV LAB;  Service: Cardiovascular;  Laterality: N/A;   VASCULAR SURGERY      Family History: Family History  Problem Relation Age of Onset   Hypertension Mother    Diabetes Father    Hypertension Father    Liver cancer Maternal Grandmother    Hypertension Maternal Grandfather    Breast cancer Paternal Grandmother    Heart disease Paternal Grandfather     Social History  reports that she has been smoking cigarettes. She has a 12 pack-year smoking history. She has never used smokeless tobacco. She reports current alcohol use. She reports current drug use. Drug: Marijuana.  Allergies  Allergen Reactions   Penicillins Swelling    Tolerated ceftriaxone and cefepime in past per [facility] and [facility] records    Medications   Current Facility-Administered Medications:    0.9 %  sodium chloride infusion, , Intravenous, Continuous, Bradler, Clent Jacks, MD, Stopped at 06/10/23 2337   [START ON 06/12/2023] acetaminophen (TYLENOL) tablet 650 mg, 650 mg, Oral, Q4H PRN **OR** [START ON 06/12/2023] acetaminophen (TYLENOL) 160 MG/5ML solution 650 mg, 650 mg, Per Tube, Q4H PRN **OR** [START ON 06/12/2023] acetaminophen (TYLENOL) suppository 650 mg, 650 mg, Rectal, Q4H PRN, Ezequiel Essex, NP   cefTAZidime (FORTAZ) 2 g in sodium chloride 0.9 % 100 mL IVPB, 2 g, Intravenous, Q12H, Ezequiel Essex, NP, Stopped at 06/10/23 1815   Chlorhexidine Gluconate Cloth 2 % PADS 6 each, 6 each, Topical, Q0600, Ezequiel Essex, NP   docusate sodium (COLACE) capsule 100 mg, 100 mg, Oral, BID PRN, Ezequiel Essex, NP   fentaNYL (SUBLIMAZE) bolus via infusion 50-100 mcg, 50-100 mcg, Intravenous, Q15 min PRN, Bradler, Clent Jacks, MD   fentaNYL (SUBLIMAZE) injection 50 mcg, 50 mcg, Intravenous, Once, Bradler, Clent Jacks, MD   fentaNYL in NS (58mcg/ml) infusion-PREMIX, 50-200 mcg/hr, Intravenous, Continuous, Bradler, Clent Jacks, MD, Last Rate: 7.5 mL/hr at 06/11/23 0000, 75  mcg/hr at 06/11/23 0000   folic acid injection 1 mg, 1 mg, Intravenous, Daily, Ezequiel Essex, NP, 1 mg at 06/10/23 1745   heparin ADULT infusion 100 units/mL (25000 units/268mL), 1,000 Units/hr, Intravenous, Continuous, Aleskerov, Fuad, MD, Last Rate: 10 mL/hr at 06/11/23 0000, 1,000 Units/hr at 06/11/23 0000   heparin bolus via infusion 4,000 Units, 4,000 Units, Intravenous, Once, Lowella Bandy, RPH   insulin aspart (novoLOG) injection 0-9 Units, 0-9 Units, Subcutaneous, Q4H, Ezequiel Essex, NP, 1 Units at 06/11/23 0322   ipratropium-albuterol (DUONEB) 0.5-2.5 (3) MG/3ML nebulizer solution 3 mL, 3 mL, Nebulization, Q6H PRN, Ezequiel Essex, NP   lactulose (CHRONULAC) 10 GM/15ML solution 20 g, 20 g, Per Tube, BID, Ezequiel Essex, NP, 20 g at 06/10/23 2118   levETIRAcetam (KEPPRA) IVPB 500 mg/100 mL premix, 500 mg, Intravenous, Q12H, Ezequiel Essex, NP, Stopped at 06/10/23 2324   metroNIDAZOLE (FLAGYL) IVPB 500 mg, 500 mg, Intravenous, Q12H, Ezequiel Essex, NP, Stopped at 06/10/23 2133   midazolam (VERSED) 100 mg/100 mL (1 mg/mL) premix infusion, 20 mg/hr, Intravenous, Continuous, Erick Blinks, MD, Last Rate: 20 mL/hr at 06/11/23 0035, 20 mg/hr at  06/11/23 0035   midazolam (VERSED) bolus via infusion 0-5 mg, 0-5 mg, Intravenous, Q1H PRN, Vicente Males, Clent Jacks, MD   multivitamin liquid 15 mL, 15 mL, Per Tube, Daily, Ezequiel Essex, NP   norepinephrine (LEVOPHED) 16 mg in (0.064 mg/mL) premix infusion, 0-40 mcg/min, Intravenous, Continuous, Karna Christmas, Fuad, MD, Last Rate: 9.38 mL/hr at 06/11/23 0000, 10 mcg/min at 06/11/23 0000   Oral care mouth rinse, 15 mL, Mouth Rinse, Q2H, Ezequiel Essex, NP, 15 mL at 06/11/23 1610   Oral care mouth rinse, 15 mL, Mouth Rinse, PRN, Ezequiel Essex, NP   pantoprazole (PROTONIX) injection 40 mg, 40 mg, Intravenous, Q24H, Ezequiel Essex, NP, 40 mg at July 06, 2023 2001   polyethylene glycol (MIRALAX / GLYCOLAX) packet 17 g, 17 g, Oral, Daily PRN, Ezequiel Essex, NP    potassium chloride 10 mEq in 100 mL IVPB, 10 mEq, Intravenous, Once, Rockwell Alexandria, RPH   propofol (DIPRIVAN) 1000 MG/100ML infusion, 5-80 mcg/kg/min, Intravenous, Titrated, Ouma, Hubbard Hartshorn, NP, Stopped at 2023/07/06 1954   thiamine (VITAMIN B1) 500 mg in sodium chloride 0.9 % 50 mL IVPB, 500 mg, Intravenous, Daily, Paused at 2023/07/06 1955 **FOLLOWED BY** [START ON 06/29/2023] thiamine (VITAMIN B1) injection 100 mg, 100 mg, Intravenous, Daily, Ezequiel Essex, NP   vancomycin (VANCOREADY) IVPB 750 mg/150 mL, 750 mg, Intravenous, Q24H, Burnis Medin D, RPH   vecuronium (NORCURON) injection 10 mg, 10 mg, Intravenous, Q1H PRN, Jimmye Norman, NP, 10 mg at 07-06-23 2027  Vitals   Vitals:   06/11/23 0400 06/11/23 0415 06/11/23 0425 06/11/23 0430  BP: (!) 124/91 (!) 125/92  (!) 127/96  Pulse: (!) 105 (!) 104 (!) 106 (!) 108  Resp: 18 18 18 18   Temp: 98.6 F (37 C) 98.6 F (37 C) 98.6 F (37 C) 98.6 F (37 C)  TempSrc: Esophageal     SpO2: 100% 100% 100% 99%  Weight:      Height:        Body mass index is 29.72 kg/m.  Physical Exam   Constitutional: intubated, sedated on versed and fentanyl CV: RRR Resp: intubated  Neurologic Examination   MS: no response to noxious stimuli, not following commands on sedation Speech: intubated no attempts to speak CN: PERRL, (-) corneals, oculocephalics, cough, gag, face symmetric at rest Motor & sensory: no response to noxious stimuli in any extremity  On my examination minimal activity concerning for myoclonus however Ms. Ouma NP reported escalating stimulus-induced myoclonus which subsequently became continuous  Labs/Imaging/Neurodiagnostic studies   CBC:  Recent Labs  Lab 07-06-23 1129 06/11/23 0351  WBC 17.3* 19.0*  HGB 15.2* 13.6  HCT 46.7* 41.6  MCV 98.3 98.1  PLT 236 220   Basic Metabolic Panel:  Lab Results  Component Value Date   NA 140 07/06/23   K 3.3 (L) 07-06-23   CO2 22 07/06/2023   GLUCOSE 134  (H) 07-06-23   BUN 11 07/06/2023   CREATININE 1.76 (H) Jul 06, 2023   CALCIUM 8.6 (L) 2023-07-06   GFRNONAA 38 (L) July 06, 2023   GFRAA >60 12/10/2019   Lipid Panel:  Lab Results  Component Value Date   LDLCALC 85 02/06/2023   HgbA1c:  Lab Results  Component Value Date   HGBA1C 5.3 07/06/23   Urine Drug Screen:     Component Value Date/Time   LABOPIA POSITIVE (A) 2023/07/06 1129   COCAINSCRNUR NONE DETECTED 07-06-23 1129   LABBENZ POSITIVE (A) 06-Jul-2023 1129   AMPHETMU NONE DETECTED 06-Jul-2023 1129   THCU  POSITIVE (A) 06/10/2023 1129   LABBARB NONE DETECTED 06/10/2023 1129    Alcohol Level     Component Value Date/Time   ETH <10 06/10/2023 1447   INR  Lab Results  Component Value Date   INR 1.4 (H) 06/10/2023   APTT  Lab Results  Component Value Date   APTT 37 (H) 06/10/2023   AED levels: No results found for: "PHENYTOIN", "ZONISAMIDE", "LAMOTRIGINE", "LEVETIRACETA"  CT Head without contrast(Personally reviewed): No acute process  Neurodiagnostics rEEG:  Epileptiform bursts q 10 sec  ASSESSMENT   This is a 38 year old woman with past medical history significant for alcohol abuse complicated by withdrawal seizures who presents after V-fib arrest after witnessed seizure activity secondary to alcohol withdrawal. It is too early to provide formal prognostication but status myoclonus evolving overnight is a poor prognostic sign. She is on versed and levophed. Aggressive tx with burst suppression would not change outcomes therefore transfer for cEEG was not recommended. Addition of propofol not recommended 2/2 hypotension on levophed. Ketamine was considered ([facility] status protocol) but ICU deferred 2/2 concern for sympathomimetic effects in the setting of ACS. Ms. Anna Genre was concerned that myoclonus was causing escalating vent settings and would destabilize patient further 2/2 vent dyssynchrony therefore prn paralytic was ordered as needed to avoid vent dyssynchrony.  Versed was increased and myoclonus has stopped at this time clinically although epileptiform bursts remain present on ceribell. The recommended course of action is GOC discussions when family is able to be contacted rather than transfer for cEEG given that aggressive treatment of myoclonic status will not improve her outcome.  RECOMMENDATIONS   - Continue keppra 500mg  bid - Continue versed gtt escalate as needed - [facility] protocol ketamine could be considered for symptomatic control but would not change outcomes - Avoid propofol as long as she is hypotensive and/or requiring pressors - PRN paralytics ordered per above - rEEG tmrw - GOC discussion when family is available - Will follow for prognostication  This patient is critically ill and at significant risk of neurological worsening, death and care requires constant monitoring of vital signs, hemodynamics,respiratory and cardiac monitoring, neurological assessment, discussion with family, other specialists and medical decision making of high complexity. I spent 70 minutes of neurocritical care time  in the care of  this patient. This was time spent independent of any time provided by nurse practitioner or PA.  Bing Neighbors, MD [facility] [phone]  If 7pm- 7am, please page neurology on call as listed in AMION.

## 2023-06-11 NOTE — Progress Notes (Cosign Needed Addendum)
 PHARMACY - ANTICOAGULATION CONSULT NOTE  Pharmacy Consult for heparin infusion Indication: chest pain/ACS  Allergies  Allergen Reactions   Penicillins Swelling    Tolerated ceftriaxone and cefepime in past per [facility] and [facility] records   Patient Measurements: Height: 5\' 3"  (160 cm) Weight: 76.1 kg (167 lb 12.3 oz) IBW/kg (Calculated) : 52.4 Heparin Dosing Weight: 69 kg  Vital Signs: Temp: 99.1 F (37.3 C) (03/03 1500) Temp Source: Bladder (03/03 1200) BP: 119/95 (03/03 1200) Pulse Rate: 129 (03/03 1200)  Labs: Recent Labs    06/10/23 1129 06/10/23 1344 06/10/23 1600 06/10/23 1831 06/10/23 2248 06/11/23 0351 06/11/23 0558 06/11/23 1506  HGB 15.2*  --   --   --   --  13.6  --   --   HCT 46.7*  --   --   --   --  41.6  --   --   PLT 236  --   --   --   --  220  --   --   APTT  --   --  37*  --   --   --   --   --   LABPROT  --   --  17.1*  --   --   --   --   --   INR  --   --  1.4*  --   --   --   --   --   HEPARINUNFRC  --   --   --   --  0.20*  --  0.27* 0.26*  CREATININE 1.62*  --   --  1.76*  --  1.78*  --   --   CKTOTAL  --  481*  --   --   --   --   --   --   TROPONINIHS 968* 11,582*  --  >24,000*  --   --   --   --     Estimated Creatinine Clearance: 42.3 mL/min (A) (by C-G formula based on SCr of 1.78 mg/dL (H)).  Medical History: Past Medical History:  Diagnosis Date   Alcohol abuse    Anxiety    Biliary calculi 12/07/2009   Cholelithiasis    Depression    Diet-controlled diabetes mellitus (HCC)    GERD (gastroesophageal reflux disease)    Hepatitis C    body "self healed" per patient   LVH (left ventricular hypertrophy)    a. 10/2022 Echo: EF 60-65%, no rwma, sev conc LVH, nl RV size/fxn, triv MR. Mobile density near ant MV leaflet- felt to be redundant chordae tendinae..   Morbid obesity (HCC)    Palpitations    a. 07/2019 Holter: Sinus rhythm, PVCs, rare PACs.  No significant arrhythmias.   Pancreatic pseudocyst    a. 02/2023 MRI abd: improving  unilocular pancreatic pseudocyst.   Retained intrauterine contraceptive device (IUD) 01/20/2020   Substance abuse (HCC)    Medications:  Scheduled:   Chlorhexidine Gluconate Cloth  6 each Topical Q0600   fentaNYL (SUBLIMAZE) injection  50 mcg Intravenous Once   folic acid  1 mg Intravenous Daily   heparin  4,000 Units Intravenous Once   insulin aspart  0-9 Units Subcutaneous Q4H   lactulose  30 g Per Tube TID   multivitamin  15 mL Per Tube Daily   mouth rinse  15 mL Mouth Rinse Q2H   pantoprazole (PROTONIX) IV  40 mg Intravenous Q24H   [START ON 07/07/2023] thiamine (VITAMIN B1) injection  100 mg Intravenous Daily  Assessment: 38 yo female with a PMH of ETOH abuse, seizure activity secondary to ETOH withdrawal, and alcoholic liver cirrhosis. She is on no chronic anticoagulation PTA based on dispens history. Pharmacy was consulted to dose this patient's heparin.   Baseline Labs:  Hgb15.2, PLT wnl, INR/aPTT pending  Goal of Therapy:  Heparin level 0.3-0.7 units/ml Monitor platelets by anticoagulation protocol: Yes  Heparin Levels Date/Time  HL  Clinical Assessment 3/3@0558    0.27  SUBtherapeutic 3/3@1506    0.26  SUBtherapeutic    Plan:  -- Heparin is SUBtherapeutic -- Give heparin 1050 units IV X 1 bolus  -- Increase heparin drip rate to 1250 from 1100 units/hr.  -- Recheck HL 6 hrs after rate change  -- Planned to stop heparin at 1600 on 3/4 (48 hour stop time per cardiology) -- Continue to follow CBC and platelets with AM labs   Effie Shy, PharmD Pharmacy Resident  06/11/2023 3:34 PM

## 2023-06-11 NOTE — Progress Notes (Signed)
 PHARMACY CONSULT NOTE - FOLLOW UP  Pharmacy Consult for Electrolyte Monitoring and Replacement   Recent Labs: Potassium (mmol/L)  Date Value  06/11/2023 3.0 (L)   Magnesium (mg/dL)  Date Value  40/98/1191 2.2   Calcium (mg/dL)  Date Value  47/82/9562 8.5 (L)   Albumin (g/dL)  Date Value  13/11/6576 2.7 (L)  05/08/2023 3.9   Phosphorus (mg/dL)  Date Value  46/96/2952 4.6   Sodium (mmol/L)  Date Value  06/11/2023 137  05/08/2023 140   Assessment: 38 yo female with a PMH of ETOH abuse, seizure activity secondary to ETOH withdrawal, and alcoholic liver cirrhosis. Pharmacy is asked to follow and replace electrolytes while in CCU  Goal of Therapy:  Electrolytes WNL  Plan:  --- Order 10 mEq IV KCl x 6  --- Recheck electrolytes in am  Effie Shy, PharmD Pharmacy Resident  06/11/2023 6:10 AM

## 2023-06-11 NOTE — Progress Notes (Signed)
 NEUROLOGY CONSULT FOLLOW UP NOTE   Date of service: June 11, 2023 Patient Name: Katie Stanley MRN:  [id number] DOB:  14-Jan-1986  Interval Hx/subjective  Clinically apparent myoclonic seizing stopped after midnight.  Does have spontaneous and nonpurposeful eye opening that has been persistent since.  Vitals   Vitals:   06/11/23 0645 06/11/23 0700 06/11/23 0715 06/11/23 0831  BP: (!) 129/96 (!) 128/97 (!) 134/97   Pulse: (!) 107 (!) 108 (!) 106   Resp: 18 18 18    Temp: 98.1 F (36.7 C) (!) 97.3 F (36.3 C) (!) 97.3 F (36.3 C)   TempSrc:      SpO2: 99% 99% 99% 95%  Weight:      Height:         Body mass index is 29.72 kg/m.  Physical Exam  General: Sedated on Versed drip and fentanyl, intubated HEENT: Normocephalic atraumatic Lungs: Vented Cardiovascular: Tachycardic Neurological exam Sedated intubated No spontaneous limb movements but spontaneously has myoclonic appearing eye opening movements, which are more prominent with stimulation-verbal or tactile. Does not follow commands Nonverbal Cranial nerves: Pupils are equal round and reactive to light, unable to elicit corneal reflexes, breathing with the ventilator when I entered the room but upon examining her she breathes over the ventilator, no gag with mild cough on suctioning. Motor exam: No spontaneous movement.  To noxious stimulation there is mild flexion of both upper extremities.  It also appears that she grimaces and starts to breathe more against the ventilator with noxious stimulation is applied to the lower extremities but there was no withdrawal or flexion or extension noted in the lower extremities.  Medications  Current Facility-Administered Medications:    0.9 %  sodium chloride infusion, , Intravenous, Continuous, Bradler, Clent Jacks, MD, Last Rate: 40 mL/hr at 06/11/23 0645, Infusion Verify at 06/11/23 0645   [START ON 06/12/2023] acetaminophen (TYLENOL) tablet 650 mg, 650 mg, Oral, Q4H PRN **OR** [START ON  06/12/2023] acetaminophen (TYLENOL) 160 MG/5ML solution 650 mg, 650 mg, Per Tube, Q4H PRN **OR** [START ON 06/12/2023] acetaminophen (TYLENOL) suppository 650 mg, 650 mg, Rectal, Q4H PRN, Ezequiel Essex, NP   cefTAZidime (FORTAZ) 2 g in sodium chloride 0.9 % 100 mL IVPB, 2 g, Intravenous, Q12H, Ezequiel Essex, NP, Stopped at 06/10/23 1815   Chlorhexidine Gluconate Cloth 2 % PADS 6 each, 6 each, Topical, Q0600, Ezequiel Essex, NP   docusate sodium (COLACE) capsule 100 mg, 100 mg, Oral, BID PRN, Ezequiel Essex, NP   fentaNYL (SUBLIMAZE) bolus via infusion 50-100 mcg, 50-100 mcg, Intravenous, Q15 min PRN, Bradler, Clent Jacks, MD   fentaNYL (SUBLIMAZE) injection 50 mcg, 50 mcg, Intravenous, Once, Bradler, Clent Jacks, MD   fentaNYL in NS (24mcg/ml) infusion-PREMIX, 50-200 mcg/hr, Intravenous, Continuous, Bradler, Clent Jacks, MD, Last Rate: 7.5 mL/hr at 06/11/23 0645, 75 mcg/hr at 06/11/23 0645   folic acid injection 1 mg, 1 mg, Intravenous, Daily, Ezequiel Essex, NP, 1 mg at 06/10/23 1745   heparin ADULT infusion 100 units/mL (25000 units/264mL), 1,100 Units/hr, Intravenous, Continuous, Aleskerov, Fuad, MD, Last Rate: 10 mL/hr at 06/11/23 0645, 1,000 Units/hr at 06/11/23 0645   heparin bolus via infusion 4,000 Units, 4,000 Units, Intravenous, Once, Lowella Bandy, RPH   insulin aspart (novoLOG) injection 0-9 Units, 0-9 Units, Subcutaneous, Q4H, Ezequiel Essex, NP, 1 Units at 06/11/23 0826   ipratropium-albuterol (DUONEB) 0.5-2.5 (3) MG/3ML nebulizer solution 3 mL, 3 mL, Nebulization, Q6H PRN, Ezequiel Essex, NP   lactulose (CHRONULAC) 10  GM/15ML solution 20 g, 20 g, Per Tube, BID, Ezequiel Essex, NP, 20 g at 06/10/23 2118   levETIRAcetam (KEPPRA) IVPB 500 mg/100 mL premix, 500 mg, Intravenous, Q12H, Ezequiel Essex, NP, Stopped at 06/10/23 2324   metroNIDAZOLE (FLAGYL) IVPB 500 mg, 500 mg, Intravenous, Q12H, Ezequiel Essex, NP, Stopped at 06/11/23 1610   midazolam (VERSED) 100 mg/100 mL (1 mg/mL) premix  infusion, 20 mg/hr, Intravenous, Continuous, Erick Blinks, MD, Last Rate: 20 mL/hr at 06/11/23 0645, 20 mg/hr at 06/11/23 0645   midazolam (VERSED) bolus via infusion 0-5 mg, 0-5 mg, Intravenous, Q1H PRN, Merwyn Katos, MD   multivitamin liquid 15 mL, 15 mL, Per Tube, Daily, Ezequiel Essex, NP   norepinephrine (LEVOPHED) 16 mg in (0.064 mg/mL) premix infusion, 0-40 mcg/min, Intravenous, Continuous, Vida Rigger, MD, Last Rate: 6.56 mL/hr at 06/11/23 0645, 7 mcg/min at 06/11/23 0645   Oral care mouth rinse, 15 mL, Mouth Rinse, Q2H, Ezequiel Essex, NP, 15 mL at 06/11/23 0827   Oral care mouth rinse, 15 mL, Mouth Rinse, PRN, Ezequiel Essex, NP   pantoprazole (PROTONIX) injection 40 mg, 40 mg, Intravenous, Q24H, Ezequiel Essex, NP, 40 mg at 06/10/23 2001   polyethylene glycol (MIRALAX / GLYCOLAX) packet 17 g, 17 g, Oral, Daily PRN, Ezequiel Essex, NP   potassium chloride 10 mEq in 100 mL IVPB, 10 mEq, Intravenous, Q1 Hr x 6, Effie Shy, [facility], Last Rate: 100 mL/hr at 06/11/23 0736, 10 mEq at 06/11/23 0736   propofol (DIPRIVAN) 1000 MG/100ML infusion, 5-80 mcg/kg/min, Intravenous, Titrated, Jimmye Norman, NP, Stopped at 06/10/23 1954   thiamine (VITAMIN B1) 500 mg in sodium chloride 0.9 % 50 mL IVPB, 500 mg, Intravenous, Daily, Paused at 06/10/23 1955 **FOLLOWED BY** [START ON 06/15/2023] thiamine (VITAMIN B1) injection 100 mg, 100 mg, Intravenous, Daily, Ezequiel Essex, NP   vancomycin (VANCOREADY) IVPB 750 mg/150 mL, 750 mg, Intravenous, Q24H, Lowella Bandy, RPH   vecuronium (NORCURON) injection 10 mg, 10 mg, Intravenous, Q1H PRN, Jimmye Norman, NP, 10 mg at 06/10/23 2027  Labs and Diagnostic Imaging   CBC:  Recent Labs  Lab 06/10/23 1129 06/11/23 0351  WBC 17.3* 19.0*  HGB 15.2* 13.6  HCT 46.7* 41.6  MCV 98.3 98.1  PLT 236 220    Basic Metabolic Panel:  Lab Results  Component Value Date   NA 137 06/11/2023   K 3.0 (L) 06/11/2023   CO2 23  06/11/2023   GLUCOSE 146 (H) 06/11/2023   BUN 13 06/11/2023   CREATININE 1.78 (H) 06/11/2023   CALCIUM 8.5 (L) 06/11/2023   GFRNONAA 37 (L) 06/11/2023   GFRAA >60 12/10/2019   Lipid Panel:  Lab Results  Component Value Date   LDLCALC 85 02/06/2023   HgbA1c:  Lab Results  Component Value Date   HGBA1C 5.3 06/10/2023   Urine Drug Screen:     Component Value Date/Time   LABOPIA POSITIVE (A) 06/10/2023 1129   COCAINSCRNUR NONE DETECTED 06/10/2023 1129   LABBENZ POSITIVE (A) 06/10/2023 1129   AMPHETMU NONE DETECTED 06/10/2023 1129   THCU POSITIVE (A) 06/10/2023 1129   LABBARB NONE DETECTED 06/10/2023 1129    Alcohol Level     Component Value Date/Time   ETH <10 06/10/2023 1447   INR  Lab Results  Component Value Date   INR 1.4 (H) 06/10/2023   APTT  Lab Results  Component Value Date   APTT 37 (H) 06/10/2023   CT Head without contrast(Personally  reviewed): No acute changes  Rapid continuous EEG ([facility]):  ABNORMALITY - Periodic epileptiform discharges, bilateral posterior quadrant - Continuous slow, generalized   IMPRESSION: This study showed evidence of epileptogenicity arising from bilateral posterior quadrant. Due to the frequency of 2.5 to 3 Hz, this EEG pattern is highly suspicious for ictal nature. Additionally there was severe diffuse encephalopathy.    Assessment   Aqsa Sensabaugh Kimbler is a 38 y.o. female past medical history of alcohol abuse complicated by withdrawal seizures who presented after V-fib arrest after witnessed seizure secondary to alcohol withdrawal.  Appeared to be in status myoclonus, started on Versed drip because she was also requiring pressors.  Held off on propofol due to significant side effects of hypotension.  Ketamine was considered but not used due to concern for sympathomimetic effects in the setting of ACS. Overnight neurologist recommended as needed paralytics for vent dyssynchrony while the patient remained on rapid  EEG. Myoclonus appearance of soon after cardiac arrest usually portends poor chances for neurological meaningful recovery but given her young age, and the latest guidelines, we will give her at least 72 hours prior to proceeding with further imaging and formal prognostication. EEG pattern is concerning for ictal nature. Will recommend propofol.  Recommendations  Continue EEG monitoring with rapid EEG. Will try to obtain routine EEG and see if we can leave the leads on for a longer duration of time to be monitored while the rapid EEG unit can be recharged for use overnight. I am okay with using propofol as long as ICU is not concerned about increasing pressor demand and can manage that. I would personally prefer propofol however said as Versed metabolism may be prolonged and may confound prognostication.  Plan discussed with Dr. Aundria Rud ______________________________________________________________________   Signed, Milon Dikes, MD [facility]  CRITICAL CARE ATTESTATION Performed by: Milon Dikes, MD Total critical care time: 40 minutes Critical care time was exclusive of separately billable procedures and treating other patients and/or supervising APPs/Residents/Students Critical care was necessary to treat or prevent imminent or life-threatening deterioration. This patient is critically ill and at significant risk for neurological worsening and/or death and care requires constant monitoring. Critical care was time spent personally by me on the following activities: development of treatment plan with patient and/or surrogate as well as nursing, discussions with consultants, evaluation of patient's response to treatment, examination of patient, obtaining history from patient or surrogate, ordering and performing treatments and interventions, ordering and review of laboratory studies, ordering and review of radiographic studies, pulse oximetry, re-evaluation of patient's condition,  participation in multidisciplinary rounds and medical decision making of high complexity in the care of this patient.

## 2023-06-11 NOTE — Procedures (Signed)
 Patient Name: Katie Stanley  MRN: [id number]  Epilepsy Attending: Charlsie Quest  Referring Physician/Provider: Jimmye Norman, NP  Duration: 06/10/2023 1958 to 06/11/2023 1003  Patient history: 38yo F s/p cardiac arrest. Rapid EEG to evaluate for seizure  Level of alertness:  comatose  AEDs during EEG study: Versed, LEV  Technical aspects: This EEG was obtained using a 10 lead EEG system positioned circumferentially without any parasagittal coverage (rapid EEG). Computer selected EEG is reviewed as  well as background features and all clinically significant events.  Description: At the beginning of the study, EEG showed burst suppression pattern with highly epileptiform bursts lasting 1 to 2 seconds alternating with 10 to 20 seconds of generalized EEG suppression.  Gradually the duration of interburst interval became shorter and bursts were noted every 3 to 7 seconds, lasting for 0.5 to 12 seconds.  ICU team was notified. Gradually as medications were adjusted, after around 2330 on 06/10/2023, EEG improved and showed near continuous generalized low amplitude 3 to 6 Hz theta-delta slowing with overriding 13 to 15 Hz beta activity.  Hyperventilation and photic stimulation were not performed.     ABNORMALITY -Burst suppression with highly epileptiform bursts, generalized -Continuous slow, generalized  IMPRESSION: This study initially showed evidence of epileptogenicity with generalized onset.  Given the duration and morphology of epileptiform bursts, this pattern was most likely ictal in nature.  ICU team was notified and medications were adjusted.  Gradually after around 2330 on 06/10/2023, EEG improved and was suggestive of  severe diffuse encephalopathy, likely related to sedation.  Please note that this is a limited montage ceribell EEG without video.  Therefore if concern for ictal-interictal activity persist, consider conventional EEG with video for further evaluation.  Kym Fenter Annabelle Harman

## 2023-06-11 NOTE — Progress Notes (Signed)
 Initial Nutrition Assessment  DOCUMENTATION CODES:   Not applicable  INTERVENTION:   -TF via OGT:   Initiate Vital 1.5 @ 20 ml/hr and increase by 10 ml every 4 hours to goal rate of 45 ml/hr.   60 ml Prosource TF BID  30 ml free water flush every 4 hours  Tube feeding regimen provides 1780 kcal (100% of needs), 113 grams of protein, and 823 ml of H2O. Total free water: 1003  -Continue 100 mg thiamine daily -Continue MVI daily -Continue 1 mg folic acid daily  NUTRITION DIAGNOSIS:   Inadequate oral intake related to inability to eat as evidenced by NPO status.  GOAL:   Patient will meet greater than or equal to 90% of their needs  MONITOR:   Vent status, TF tolerance  REASON FOR ASSESSMENT:   Consult, Ventilator Enteral/tube feeding initiation and management  ASSESSMENT:   Pt with PMHx significant for ETOH abuse, ETOH withdrawal seizures and alcoholic liver cirrhosis admitted following out-of-hospital V. Fib Cardiac Arrest (approximately 25 minutes of ACLS prior to ROSC), NSTEMI, multifactorial Shock, suspected aspiration pneumonia, AKI, and severe myoclonus with concern for possible anoxic brain injury.  Pt admitted s/p cardiac arrest after ETOH withdrawal seizures.   3/3- s/p EEG- suspicious for ictal nature  Reviewed I/O's: +3 L x 24 hours  UOP: 215 ml x 24 hours  OGT output: 500 ml x 24 hours  Patient is currently intubated on ventilator support. Per CT on 06/10/23, OGT tip in gastric body.  MV: 8.8 L/min Temp (24hrs), Avg:98.8 F (37.1 C), Min:88.5 F (31.4 C), Max:99.9 F (37.7 C)  Propofol: 7 ml/hr (provides 185 kcals daily)   Case discussed with RN, who remembers pt from prior admission in 04/2023. She reports TF were often held during that admission due to abdominal distention, which she suspects is related to pt's cirrhosis, ascites. RD will start slow titration of TF.   MRI of brain pending. Noted concern for anoxic brain injury. Palliative care  consulted for goals of care discussions.   Reviewed wt hx; pt has experienced a 12.8% wt loss over the past 4 months, which is significant for time frame.   Medications reviewed and include folic acid, lactulose, protonix, thiamine, keppra, versed, and levophed.  Labs reviewed: K: 3.0, CBGS: 95-123 (inpatient orders for glycemic control are 0-9 units insulin aspart every 4 hours).    NUTRITION - FOCUSED PHYSICAL EXAM:  Flowsheet Row Most Recent Value  Orbital Region No depletion  Upper Arm Region No depletion  Thoracic and Lumbar Region No depletion  Buccal Region No depletion  Temple Region No depletion  Clavicle Bone Region No depletion  Clavicle and Acromion Bone Region No depletion  Scapular Bone Region No depletion  Dorsal Hand No depletion  Patellar Region No depletion  Anterior Thigh Region No depletion  Posterior Calf Region No depletion  Edema (RD Assessment) Mild  Hair Reviewed  Eyes Reviewed  Mouth Reviewed  Skin Reviewed  Nails Reviewed       Diet Order:   Diet Order     None       EDUCATION NEEDS:   Not appropriate for education at this time  Skin:  Skin Assessment: Reviewed RN Assessment  Last BM:  Unknown  Height:   Ht Readings from Last 1 Encounters:  06/10/23 5\' 3"  (1.6 m)    Weight:   Wt Readings from Last 1 Encounters:  06/11/23 76.1 kg    Ideal Body Weight:  52.3 kg  BMI:  Body mass index is 29.72 kg/m.  Estimated Nutritional Needs:   Kcal:  1717  Protein:  100-115 grams  Fluid:  1.7-1.9 L    Levada Schilling, RD, LDN, CDCES Registered Dietitian III Certified Diabetes Care and Education Specialist If unable to reach this RD, please use "RD Inpatient" group chat on secure chat between hours of 8am-4 pm daily

## 2023-06-11 NOTE — Progress Notes (Addendum)
 Notified by RN at the beginning of the shift that patient was having forceful, jerky movements throughout the body. On bedside evaluation, patient was noted with severe involuntary muscle spasms and twitching concerning for post hypoxic myclonic jerks. Patient started on propofol gtt  for burst suppression and ceribell placed. Discussed findings with Dr. Darlyn Read who advised discontinuing propofol due to risk for cerebral hypoperfusion from propofol induced hypotension. Intermittent paralytics initiated with vecuronium 10 mg q 1 hr for vent dyssynchrony from the violent  myoclonic jerks. RN report that the jerking movements stopped immediately. However, per Neurologist Dr. Melynda Ripple, EEG was showing Epileptiform burst every 10 seconds or so concerning for Myoclonic sz. Patient was already on the maxed ordered dose of versed, already on Keppra and receiving intermittent paralytics. Dr. Melynda Ripple recommended [facility] status epilepticus protocol for ketamine dosin titrate to suppress clinical sz however there was concerns for Ketamine's sympathomimetic effects induced stress-related cardiac dysfunction, with troponin in the thousands there is concern for further myocardial injury although at this point doubt if it will have much effect on the mycardium. I discussed with on call attending who recommended phenobarb as alternative and if no improvement would start Ketamine per Neuro recs. I reached back to Neurology Dr. Melynda Ripple and Dr. Selina Cooley regarding the suggestion including transfer to [facility] for continuous EEG monitoring. Per Dr. Melynda Ripple "Won't do anything different at [facility]. Ceribell is already doing what video eeg would do in this scenario. Will defer to overnight Neurologist". I reached out to Erick Blinks, MD who was very kind enough to review the chart in details and recommended increasing versed further to 20mg /hr and even higher as needed following additional bolus. Following dose adjustment, Dr Derry Lory who was also  monitoring ceribell  reported no further seizures. Patient has not had any additional dose of paralytics or increase in versed dose. Appreciate Dr Derry Lory continued input and willingness to assist with this complex patient. Neurology will continue to follow along.   Webb Silversmith, DNP, CCRN, FNP-C, AGACNP-BC Acute Care & Family Nurse Practitioner  [facility]  See Amion for personal pager PCCM on call pager [phone] until 7 am

## 2023-06-11 NOTE — IPAL (Signed)
  Interdisciplinary Goals of Care Family Meeting   Date carried out: 06/11/2023  Location of the meeting: Bedside  Member's involved: Physician and Family Member or next of kin  Durable Power of Attorney or Environmental health practitioner: patient's mother    Discussion: We discussed goals of care for Katie Stanley .  Discussed with patient's mother, Katie Stanley, that she is admitted with cardiac arrest and respiratory failure. Discussed our concern for anoxic brain injury vs seizure disorder, and explained that we need 72 hours post arrest to offer proper prognosis. Mother understands that patient's condition is critical and that the chances of passing away are high. All the questions were answered.  Code status:   Code Status: Full Code   Disposition: Continue current acute care  Time spent for the meeting: 20 minutes    [name], MD  06/11/2023, 5:39 PM

## 2023-06-11 NOTE — Progress Notes (Signed)
 Eeg done

## 2023-06-11 NOTE — Progress Notes (Signed)
 PHARMACY - ANTICOAGULATION CONSULT NOTE  Pharmacy Consult for heparin infusion Indication: chest pain/ACS  Allergies  Allergen Reactions   Penicillins Swelling    Tolerated ceftriaxone and cefepime in past per [facility] and [facility] records    Patient Measurements: Height: 5\' 3"  (160 cm) Weight: 76.1 kg (167 lb 12.3 oz) IBW/kg (Calculated) : 52.4 Heparin Dosing Weight: 69 kg  Vital Signs: Temp: 98.2 F (36.8 C) (03/03 0600) Temp Source: Esophageal (03/03 0400) BP: 128/95 (03/03 0600) Pulse Rate: 105 (03/03 0600)  Labs: Recent Labs    06/10/23 1129 06/10/23 1344 06/10/23 1600 06/10/23 1831 06/10/23 2248 06/11/23 0351 06/11/23 0558  HGB 15.2*  --   --   --   --  13.6  --   HCT 46.7*  --   --   --   --  41.6  --   PLT 236  --   --   --   --  220  --   APTT  --   --  37*  --   --   --   --   LABPROT  --   --  17.1*  --   --   --   --   INR  --   --  1.4*  --   --   --   --   HEPARINUNFRC  --   --   --   --  0.20*  --  0.27*  CREATININE 1.62*  --   --  1.76*  --  1.78*  --   CKTOTAL  --  481*  --   --   --   --   --   TROPONINIHS 968* 11,582*  --  >24,000*  --   --   --     Estimated Creatinine Clearance: 42.3 mL/min (A) (by C-G formula based on SCr of 1.78 mg/dL (H)).   Medical History: Past Medical History:  Diagnosis Date   Alcohol abuse    Anxiety    Biliary calculi 12/07/2009   Cholelithiasis    Depression    Diet-controlled diabetes mellitus (HCC)    GERD (gastroesophageal reflux disease)    Hepatitis C    body "self healed" per patient   LVH (left ventricular hypertrophy)    a. 10/2022 Echo: EF 60-65%, no rwma, sev conc LVH, nl RV size/fxn, triv MR. Mobile density near ant MV leaflet- felt to be redundant chordae tendinae..   Morbid obesity (HCC)    Palpitations    a. 07/2019 Holter: Sinus rhythm, PVCs, rare PACs.  No significant arrhythmias.   Pancreatic pseudocyst    a. 02/2023 MRI abd: improving unilocular pancreatic pseudocyst.   Retained intrauterine  contraceptive device (IUD) 01/20/2020   Substance abuse (HCC)     Medications:  Scheduled:   Chlorhexidine Gluconate Cloth  6 each Topical Q0600   fentaNYL (SUBLIMAZE) injection  50 mcg Intravenous Once   folic acid  1 mg Intravenous Daily   heparin  1,050 Units Intravenous Once   heparin  4,000 Units Intravenous Once   insulin aspart  0-9 Units Subcutaneous Q4H   lactulose  20 g Per Tube BID   multivitamin  15 mL Per Tube Daily   mouth rinse  15 mL Mouth Rinse Q2H   pantoprazole (PROTONIX) IV  40 mg Intravenous Q24H   [START ON 06/19/2023] thiamine (VITAMIN B1) injection  100 mg Intravenous Daily    Assessment: 38 yo female with a PMH of ETOH abuse, seizure activity secondary to ETOH withdrawal,  and alcoholic liver cirrhosis. She is on no chronic anticoagulation PTA based on dispens history  Baseline Labs: Hgb15.2, PLT wnl, INR/aPTT pending  Goal of Therapy:  Heparin level 0.3-0.7 units/ml Monitor platelets by anticoagulation protocol: Yes   Plan:  3/3:  HL @ 0558 = 0.27, SUBtherapeutic - Will order heparin 1050 units IV X 1 bolus and increase drip rate to 1100 units/hr.  - Will recheck HL 6 hrs after rate change   Katie Stanley D 06/11/2023,6:42 AM

## 2023-06-11 NOTE — Progress Notes (Signed)
 RT assisted with patient transport to MRI and back to ICU with no complications during transport. Patient transported on the [employer].

## 2023-06-11 NOTE — Consult Note (Signed)
 Cardiology Consultation   Patient ID: BEREA MAJKOWSKI MRN: [id number]; DOB: 02-12-1986  Admit date: 06/10/2023 Date of Consult: 06/11/2023  PCP:  Dorcas Carrow, DO   [facility] Providers Cardiologist:  Julien Nordmann, MD        Patient Profile:   Katie Stanley is a 38 y.o. female with a hx of alcohol abuse who is being seen 06/11/2023 for the evaluation of cardiac arrest and myocardial infarction at the request of Dr. Karna Christmas.  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 She had multiple hospitalizations for alcohol-related issues and infections.  She had mildly elevated troponin last year during one of her admissions.  Echocardiogram showed normal LV systolic function.  She was most recently seen in our office in November for atypical chest pain.  She had an echocardiogram done that was unremarkable overall.  She was scheduled for a Lexiscan Myoview but the test did not happen likely due to hospitalization. She was brought by EMS after family witnessed seizure-like activities.  According to EMS, she was in ventricular fibrillation and required defibrillation with epi x 4, amiodarone, 2 amps of bicarb before ROSC.  Apparently, there has been reports of excessive alcohol use and she has been trying to stop but was having issues with seizures every time she cut down.  She was intubated and has been having status myoclonus requiring increased dose of Versed and other antiseizure medications.  Her troponin increased to greater than 24,000.  EKG with no significant ischemic changes.  Lactic acid was greater than 9.   Past Medical History:  Diagnosis Date   Alcohol abuse    Anxiety    Biliary calculi 12/07/2009   Cholelithiasis    Depression    Diet-controlled diabetes mellitus (HCC)    GERD (gastroesophageal reflux  disease)    Hepatitis C    body "self healed" per patient   LVH (left ventricular hypertrophy)    a. 10/2022 Echo: EF 60-65%, no rwma, sev conc LVH, nl RV size/fxn, triv MR. Mobile density near ant MV leaflet- felt to be redundant chordae tendinae..   Morbid obesity (HCC)    Palpitations    a. 07/2019 Holter: Sinus rhythm, PVCs, rare PACs.  No significant arrhythmias.   Pancreatic pseudocyst    a. 02/2023 MRI abd: improving unilocular pancreatic pseudocyst.   Retained intrauterine contraceptive device (IUD) 01/20/2020   Substance abuse (HCC)     Past Surgical History:  Procedure Laterality Date   CHOLECYSTECTOMY     2011   COLONOSCOPY WITH PROPOFOL N/A 09/20/2022   Procedure: COLONOSCOPY WITH PROPOFOL;  Surgeon: Midge Minium, MD;  Location: [facility] ENDOSCOPY;  Service: Endoscopy;  Laterality: N/A;   ESOPHAGOGASTRODUODENOSCOPY N/A 09/29/2021   Procedure: ESOPHAGOGASTRODUODENOSCOPY (EGD);  Surgeon: Midge Minium, MD;  Location: [facility] ENDOSCOPY;  Service: Endoscopy;  Laterality: N/A;   ESOPHAGOGASTRODUODENOSCOPY (EGD) WITH PROPOFOL N/A 01/21/2018   Procedure: ESOPHAGOGASTRODUODENOSCOPY (EGD) WITH Biopsy;  Surgeon: Midge Minium, MD;  Location: [facility];  Service: Endoscopy;  Laterality: N/A;   HYSTEROSCOPY WITH D & C N/A 01/20/2020   Procedure: DILATATION AND CURETTAGE /HYSTEROSCOPY;  Surgeon: Conard Novak, MD;  Location: [facility] ORS;  Service: Gynecology;  Laterality: N/A;   INTRAUTERINE DEVICE (IUD) INSERTION N/A 01/20/2020   Procedure: INTRAUTERINE DEVICE (IUD) INSERTION MIRENA IUD;  Surgeon: Conard Novak, MD;  Location: [facility] ORS;  Service: Gynecology;  Laterality: N/A;   IUD REMOVAL N/A 01/20/2020   Procedure: INTRAUTERINE DEVICE (IUD) REMOVAL;  Surgeon: Conard Novak, MD;  Location: [facility] ORS;  Service: Gynecology;  Laterality: N/A;   PORT-A-CATH REMOVAL  02/08/2022   PORTA CATH INSERTION N/A 01/01/2019   Procedure: PORTA CATH INSERTION;  Surgeon: Annice Needy, MD;   Location: [facility] INVASIVE CV LAB;  Service: Cardiovascular;  Laterality: N/A;   VASCULAR SURGERY       Home Medications:  Prior to Admission medications   Medication Sig Start Date End Date Taking? Authorizing Provider  acetaminophen (TYLENOL) 500 MG tablet Take 1 tablet (500 mg total) by mouth 3 (three) times daily as needed for headache, fever or moderate pain (pain score 4-6). 04/18/23  Yes Loyce Dys, MD  citalopram (CELEXA) 20 MG tablet TAKE 1.5 TABLET(20 MG) BY MOUTH DAILY 02/20/23  Yes Johnson, Megan P, DO  famotidine (PEPCID) 20 MG tablet Take 20 mg by mouth 2 (two) times daily.   Yes [provider]  fluticasone furoate-vilanterol (BREO ELLIPTA) 200-25 MCG/ACT AEPB Inhale 1 puff into the lungs daily. 04/19/23  Yes Loyce Dys, MD  furosemide (LASIX) 40 MG tablet Take 1 tablet (40 mg total) by mouth 2 (two) times daily. 05/08/23  Yes Johnson, Megan P, DO  gabapentin (NEURONTIN) 300 MG capsule Take 1 capsule (300 mg total) by mouth at bedtime. Increase to twice daily if able to tolerate without any drowsiness 06/05/23  Yes Vanga, Loel Dubonnet, MD  Iron, Ferrous Sulfate, 325 (65 Fe) MG TABS Take 325 mg by mouth daily. 03/07/23  Yes Johnson, Megan P, DO  lactulose (CHRONULAC) 10 GM/15ML solution TAKE 15 MLS BY MOUTH DAILY 05/23/23  Yes Johnson, Megan P, DO  lidocaine (LIDODERM) 5 % Place 1 patch onto the skin daily. Remove & Discard patch within 12 hours or as directed by MD 04/28/22  Yes Johnson, Megan P, DO  MAGNESIUM PO Take 1 each by mouth in the morning and at bedtime. OTC patient is unsure of the dost   Yes [provider]  methocarbamol (ROBAXIN) 500 MG tablet Take 1 tablet (500 mg total) by mouth 2 (two) times daily. 05/08/23  Yes Johnson, Megan P, DO  metoprolol succinate (TOPROL-XL) 25 MG 24 hr tablet Take 1 tablet (25 mg total) by mouth daily. 02/20/23  Yes Johnson, Megan P, DO  Multiple Vitamin (MULTIVITAMIN WITH MINERALS) TABS tablet Take 1 tablet by mouth daily.  02/13/23  Yes Regalado, Belkys A, MD  pantoprazole (PROTONIX) 40 MG tablet Take 1 tablet (40 mg total) by mouth 2 (two) times daily before a meal. 05/08/23 06/10/23 Yes Johnson, Megan P, DO  potassium chloride SA (KLOR-CON M) 20 MEQ tablet TAKE 1 TABLET(20 MEQ) BY MOUTH DAILY 03/20/23  Yes Johnson, Megan P, DO  folic acid (FOLVITE) 1 MG tablet Take 1 tablet (1 mg total) by mouth daily. Patient not taking: Reported on 06/10/2023 02/13/23   Hartley Barefoot A, MD  spironolactone (ALDACTONE) 50 MG tablet Take 1 tablet (50 mg total) by mouth daily. Patient not taking: Reported on 06/07/2023 05/08/23   Dorcas Carrow, DO    Inpatient Medications: Scheduled Meds:  Chlorhexidine Gluconate Cloth  6 each Topical Q0600   fentaNYL (SUBLIMAZE) injection  50 mcg Intravenous Once   folic acid  1 mg Intravenous Daily   heparin  4,000 Units Intravenous Once   insulin aspart  0-9 Units Subcutaneous Q4H   lactulose  20 g Per Tube BID  multivitamin  15 mL Per Tube Daily   mouth rinse  15 mL Mouth Rinse Q2H   pantoprazole (PROTONIX) IV  40 mg Intravenous Q24H   [START ON 06/30/2023] thiamine (VITAMIN B1) injection  100 mg Intravenous Daily   Continuous Infusions:  sodium chloride 40 mL/hr at 06/11/23 0645   cefTAZidime (FORTAZ)  IV Stopped (06/10/23 1815)   fentaNYL infusion INTRAVENOUS 75 mcg/hr (06/11/23 0645)   heparin 1,000 Units/hr (06/11/23 0645)   levETIRAcetam Stopped (06/10/23 2324)   metronidazole Stopped (06/11/23 1610)   midazolam 20 mg/hr (06/11/23 0645)   norepinephrine (LEVOPHED) Adult infusion 7 mcg/min (06/11/23 0645)   potassium chloride 10 mEq (06/11/23 0924)   propofol (DIPRIVAN) infusion Stopped (06/10/23 1954)   thiamine (VITAMIN B1) injection 500 mg (06/11/23 0928)   vancomycin     PRN Meds: [START ON 06/12/2023] acetaminophen **OR** [START ON 06/12/2023] acetaminophen (TYLENOL) oral liquid 160 mg/5 mL **OR** [START ON 06/12/2023] acetaminophen, docusate sodium, fentaNYL,  ipratropium-albuterol, midazolam, mouth rinse, polyethylene glycol, vecuronium  Allergies:    Allergies  Allergen Reactions   Penicillins Swelling    Tolerated ceftriaxone and cefepime in past per [facility] and [facility] records    Social History:   Social History   Socioeconomic History   Marital status: Single    Spouse name: Not on file   Number of children: Not on file   Years of education: Not on file   Highest education level: Not on file  Occupational History   Not on file  Tobacco Use   Smoking status: Every Day    Current packs/day: 1.00    Average packs/day: 1 pack/day for 12.0 years (12.0 ttl pk-yrs)    Types: Cigarettes   Smokeless tobacco: Never  Vaping Use   Vaping status: Never Used  Substance and Sexual Activity   Alcohol use: Yes    Comment: less than a 5th a day. 06/05/23 states she is trying to stop drinking has a couple slip ups since 04/08/2024 but cut back a lot   Drug use: Yes    Types: Marijuana    Comment: 09/28/2021   Sexual activity: Yes    Birth control/protection: Implant, I.U.D.  Other Topics Concern   Not on file  Social History Narrative   Not on file   Social Drivers of Health   Financial Resource Strain: Low Risk  (02/26/2023)   Overall Financial Resource Strain (CARDIA)    Difficulty of Paying Living Expenses: Not hard at all  Food Insecurity: No Food Insecurity (04/17/2023)   Hunger Vital Sign    Worried About Running Out of Food in the Last Year: Never true    Ran Out of Food in the Last Year: Never true  Transportation Needs: Unknown (04/17/2023)   PRAPARE - Transportation    Lack of Transportation (Medical): No    Lack of Transportation (Non-Medical): Patient declined  Physical Activity: Insufficiently Active (02/26/2023)   Exercise Vital Sign    Days of Exercise per Week: 2 days    Minutes of Exercise per Session: 40 min  Stress: No Stress Concern Present (02/26/2023)   [employer] of Occupational Health - Occupational Stress  Questionnaire    Feeling of Stress : Not at all  Social Connections: Moderately Isolated (04/17/2023)   Social Connection and Isolation Panel [NHANES]    Frequency of Communication with Friends and Family: More than three times a week    Frequency of Social Gatherings with Friends and Family: Three times a week    Attends  Religious Services: Never    Active Member of Clubs or Organizations: No    Attends Banker Meetings: Never    Marital Status: Living with partner  Intimate Partner Violence: Not At Risk (04/17/2023)   Humiliation, Afraid, Rape, and Kick questionnaire    Fear of Current or Ex-Partner: No    Emotionally Abused: No    Physically Abused: No    Sexually Abused: No    Family History:    Family History  Problem Relation Age of Onset   Hypertension Mother    Diabetes Father    Hypertension Father    Liver cancer Maternal Grandmother    Hypertension Maternal Grandfather    Breast cancer Paternal Grandmother    Heart disease Paternal Grandfather      ROS:  Please see the history of present illness.   All other ROS reviewed and negative.     Physical Exam/Data:   Vitals:   06/11/23 0715 06/11/23 0800 06/11/23 0831 06/11/23 0900  BP: (!) 134/97 (!) 129/99 (!) 130/103 (!) 122/96  Pulse: (!) 106 (!) 112 (!) 124 (!) 119  Resp: 18 18 (!) 22 18  Temp: (!) 97.3 F (36.3 C) (!) 97.2 F (36.2 C) (!) 97.5 F (36.4 C) (!) 97.3 F (36.3 C)  TempSrc:  Bladder Bladder Bladder  SpO2: 99% 93% 95% 95%  Weight:      Height:        Intake/Output Summary (Last 24 hours) at 06/11/2023 0936 Last data filed at 06/11/2023 0745 Gross per 24 hour  Intake 3694.67 ml  Output 765 ml  Net 2929.67 ml      06/11/2023    3:15 AM 06/10/2023    3:21 PM 06/10/2023   11:42 AM  Last 3 Weights  Weight (lbs) 167 lb 12.3 oz 171 lb 1.2 oz 170 lb  Weight (kg) 76.1 kg 77.6 kg 77.111 kg     Body mass index is 29.72 kg/m.  General: Intubated and sedated with involuntarily movement of  her eyes HEENT: normal Neck: no JVD Vascular: No carotid bruits; Distal pulses 2+ bilaterally Cardiac: Tachycardic with no murmurs. Lungs:.  With clear breath sounds. Abd: soft, nontender, no hepatomegaly  Ext: no edema Musculoskeletal:  No deformities,  Skin: warm and dry  Neuro: Not able to evaluate due to to being sedated. Psych: Not able to evaluate due to sedation.  EKG:  The EKG was personally reviewed and demonstrates: Sinus rhythm with LVH with nonspecific ST changes. Telemetry:  Telemetry was personally reviewed and demonstrates: Sinus tachycardia  Relevant CV Studies: I requested an echocardiogram which is still pending  Laboratory Data:  High Sensitivity Troponin:   Recent Labs  Lab 06/10/23 1129 06/10/23 1344 06/10/23 1831  TROPONINIHS 968* 11,582* >24,000*     Chemistry Recent Labs  Lab 06/10/23 1129 06/10/23 1447 06/10/23 1831 06/10/23 2139 06/11/23 0351  NA 139  --  140  --  137  K 2.5*  --  3.2* 3.3* 3.0*  CL 88*  --  94*  --  96*  CO2 19*  --  22  --  23  GLUCOSE 243*  --  134*  --  146*  BUN 10  --  11  --  13  CREATININE 1.62*  --  1.76*  --  1.78*  CALCIUM 9.9  --  8.6*  --  8.5*  MG  --  1.5*  --  2.3 2.2  GFRNONAA 42*  --  38*  --  37*  ANIONGAP 32*  --  24*  --  18*    Recent Labs  Lab 06/10/23 1156  PROT 6.9  ALBUMIN 2.7*  AST 226*  ALT 38  ALKPHOS 263*  BILITOT 3.4*   Lipids  Recent Labs  Lab 06/11/23 0351  TRIG 75    Hematology Recent Labs  Lab 06/10/23 1129 06/11/23 0351  WBC 17.3* 19.0*  RBC 4.75 4.24  HGB 15.2* 13.6  HCT 46.7* 41.6  MCV 98.3 98.1  MCH 32.0 32.1  MCHC 32.5 32.7  RDW 16.9* 17.1*  PLT 236 220   Thyroid No results for input(s): "TSH", "FREET4" in the last 168 hours.  BNP Recent Labs  Lab 06/10/23 1129  BNP 114.1*    DDimer No results for input(s): "DDIMER" in the last 168 hours.   Radiology/Studies:  DG Chest 1 View Result Date: 06/10/2023 CLINICAL DATA:  Status post central line  placement. EXAM: CHEST  1 VIEW COMPARISON:  Chest radiograph performed the same day. FINDINGS: A left internal jugular central venous catheter tip overlies the superior vena cava. An endotracheal tube terminates in the midthoracic trachea. An enteric tube enters the stomach and terminates below the field of view. The heart size is normal. There is mild left basilar atelectasis. The right lung is clear. There is no pneumothorax on either side. IMPRESSION: Left internal jugular central venous catheter tip overlies the superior vena cava. No pneumothorax. Electronically Signed   By: Romona Curls M.D.   On: 06/10/2023 17:02   CT ABDOMEN PELVIS WO CONTRAST Result Date: 06/10/2023 CLINICAL DATA:  Sepsis. Seizure-like activity with subsequent loss of consciousness. EXAM: CT ABDOMEN AND PELVIS WITHOUT CONTRAST TECHNIQUE: Multidetector CT imaging of the abdomen and pelvis was performed following the standard protocol without IV contrast. RADIATION DOSE REDUCTION: This exam was performed according to the departmental dose-optimization program which includes automated exposure control, adjustment of the mA and/or kV according to patient size and/or use of iterative reconstruction technique. COMPARISON:  04/09/2023 FINDINGS: Lower chest: Left lower lobe is collapsed. Heart size within normal limits. Hepatobiliary: Diffuse hepatic steatosis. No focal hepatic lesion. Nodularity of left hepatic lobe suspicious for cirrhosis. Status post cholecystectomy. No bile duct dilatation. Pancreas: Unremarkable. No pancreatic ductal dilatation or surrounding inflammatory changes. Spleen: Normal in size without focal abnormality. Adrenals/Urinary Tract: No significant abnormality of the adrenal glands, kidneys, and ureters. Bladder is collapsed around Foley catheter balloon. Stomach/Bowel: Nasogastric tube terminates within the gastric body. Stomach and duodenum are unremarkable. No bowel dilatation to indicate ileus or obstruction.  Evaluation of the bowel is limited due to motion. Mild nonspecific fat stranding seen in the right paracolic gutter. No significant thickening of the wall of the ascending colon to definitively indicate inflammation. Appendix is normal. Vascular/Lymphatic: No significant vascular findings are present. No enlarged abdominal or pelvic lymph nodes. Reproductive: IUD noted within the uterus.  No adnexal abnormality. Other: No abdominal wall hernia or abnormality. No abdominopelvic ascites. Musculoskeletal: No acute or significant osseous findings. IMPRESSION: 1. Left lower lobe collapse. 2. Diffuse hepatic steatosis. Nodularity of the left hepatic lobe suspicious for cirrhosis. Electronically Signed   By: Acquanetta Belling M.D.   On: 06/10/2023 15:38   CT HEAD WO CONTRAST ( ) Result Date: 06/10/2023 CLINICAL DATA:  Unresponsive at home after seizure.  Arrest. EXAM: CT HEAD WITHOUT CONTRAST CT CERVICAL SPINE WITHOUT CONTRAST TECHNIQUE: Multidetector CT imaging of the head and cervical spine was performed following the standard protocol without intravenous contrast. Multiplanar CT image reconstructions of the cervical  spine were also generated. RADIATION DOSE REDUCTION: This exam was performed according to the departmental dose-optimization program which includes automated exposure control, adjustment of the mA and/or kV according to patient size and/or use of iterative reconstruction technique. COMPARISON:  Head CT 06/22/2022 FINDINGS: CT HEAD FINDINGS Brain: No evidence of acute infarction, hemorrhage, hydrocephalus, extra-axial collection or mass lesion/mass effect. Dilated perivascular space below the right putamen. Vascular: No hyperdense vessel or unexpected calcification. Skull: Normal. Negative for fracture or focal lesion. Sinuses/Orbits: Nasopharyngeal fluid in the setting of intubation. Symmetric proptosis from increase in retro-orbital fat, also seen on prior. CT CERVICAL SPINE FINDINGS Alignment: Normal. Skull  base and vertebrae: No acute fracture. No primary bone lesion or focal pathologic process. Soft tissues and spinal canal: No prevertebral fluid or swelling. No visible canal hematoma. Calcification at lower right internal jugular vein, likely fibrin sheath from old access. Unremarkable hardware were traversing the neck. Disc levels:  No degenerative changes or visible impingement. Upper chest: Band of atelectasis in the left upper lobe. IMPRESSION: No acute intracranial finding.  No visible anoxic injury. Negative cervical spine CT. Electronically Signed   By: Tiburcio Pea M.D.   On: 06/10/2023 12:47   CT Cervical Spine Wo Contrast Result Date: 06/10/2023 CLINICAL DATA:  Unresponsive at home after seizure.  Arrest. EXAM: CT HEAD WITHOUT CONTRAST CT CERVICAL SPINE WITHOUT CONTRAST TECHNIQUE: Multidetector CT imaging of the head and cervical spine was performed following the standard protocol without intravenous contrast. Multiplanar CT image reconstructions of the cervical spine were also generated. RADIATION DOSE REDUCTION: This exam was performed according to the departmental dose-optimization program which includes automated exposure control, adjustment of the mA and/or kV according to patient size and/or use of iterative reconstruction technique. COMPARISON:  Head CT 06/22/2022 FINDINGS: CT HEAD FINDINGS Brain: No evidence of acute infarction, hemorrhage, hydrocephalus, extra-axial collection or mass lesion/mass effect. Dilated perivascular space below the right putamen. Vascular: No hyperdense vessel or unexpected calcification. Skull: Normal. Negative for fracture or focal lesion. Sinuses/Orbits: Nasopharyngeal fluid in the setting of intubation. Symmetric proptosis from increase in retro-orbital fat, also seen on prior. CT CERVICAL SPINE FINDINGS Alignment: Normal. Skull base and vertebrae: No acute fracture. No primary bone lesion or focal pathologic process. Soft tissues and spinal canal: No  prevertebral fluid or swelling. No visible canal hematoma. Calcification at lower right internal jugular vein, likely fibrin sheath from old access. Unremarkable hardware were traversing the neck. Disc levels:  No degenerative changes or visible impingement. Upper chest: Band of atelectasis in the left upper lobe. IMPRESSION: No acute intracranial finding.  No visible anoxic injury. Negative cervical spine CT. Electronically Signed   By: Tiburcio Pea M.D.   On: 06/10/2023 12:47   DG Chest Port 1 View Result Date: 06/10/2023 CLINICAL DATA:  Cardiac arrest, tube placement EXAM: PORTABLE CHEST 1 VIEW COMPARISON:  04/14/2023 FINDINGS: Endotracheal tube is positioned with tip in the midtrachea. Esophagogastric tube with tip and side port below the diaphragm. Evaluation of the heart and lungs very limited by overlying compression board; no obvious abnormality. No acute osseous findings. IMPRESSION: 1. Endotracheal tube is positioned with tip in the midtrachea. 2. Esophagogastric tube with tip and side port below the diaphragm. 3. Evaluation of the heart and lungs very limited by overlying compression board; no obvious abnormality. Electronically Signed   By: Jearld Lesch M.D.   On: 06/10/2023 12:10     Assessment and Plan:   Ventricular fibrillation arrest in the setting of severe seizures due to  alcohol withdrawal: Continue supportive care.  Will obtain an echocardiogram to evaluate ejection fraction.  Will monitor for any arrhythmia and consider antiarrhythmic medications but no need for now. Non-ST elevation myocardial infarction: Troponin increased to greater than 24,000.  Could still be due to supply demand mismatch in the setting of cardiac arrest and ventricular fibrillation.  However, cannot exclude a primary cardiac event considering this degree of troponin elevation.  Recommend heparin drip for 48 hours and aspirin.  If the patient makes a meaningful neurologic recovery, we will consider cardiac  catheterization. Status myoclonus: Treated by neurology.  Overall poor prognosis   Risk Assessment/Risk Scores:     TIMI Risk Score for Unstable Angina or Non-ST Elevation MI:   The patient's TIMI risk score is 1, which indicates a 5% risk of all cause mortality, new or recurrent myocardial infarction or need for urgent revascularization in the next 14 days.{  For questions or updates, please contact [facility] Please consult [email or web address] for contact info under    Signed, Lorine Bears, MD  06/11/2023 9:36 AM

## 2023-06-11 NOTE — Procedures (Signed)
 Patient Name: Katie Stanley  MRN: [id number]  Epilepsy Attending: Charlsie Quest  Referring Physician/Provider: Ezequiel Essex, NP  Date: 06/11/2023 Duration: 30.56 mins  Patient history: 38yo F s/p cardiac arrest. Rapid EEG to evaluate for seizure   Level of alertness:  comatose  AEDs during EEG study: Versed, propofol, Keppra  Technical aspects: This EEG study was done with scalp electrodes positioned according to the 10-20 International system of electrode placement. Electrical activity was reviewed with band pass filter of 1-70Hz , sensitivity of 7 uV/mm, display speed of 29mm/sec with a 60Hz  notched filter applied as appropriate. EEG data were recorded continuously and digitally stored.  Video monitoring was available and reviewed as appropriate.  Description: EEG showed continuous generalized 3 to 6 Hz theta-delta slowing.  Periodic epileptiform discharges were noted in bilateral posterior quadrant at 2.5 to 3 Hz without definite evolution.  Hyperventilation and photic stimulation were not performed.     ABNORMALITY - Periodic epileptiform discharges, bilateral posterior quadrant - Continuous slow, generalized  IMPRESSION: This study showed evidence of epileptogenicity arising from bilateral posterior quadrant. Due to the frequency of 2.5 to 3 Hz, this EEG pattern is highly suspicious for ictal nature. Additionally there was severe diffuse encephalopathy.    Dr. Wilford Corner was notified.  Kavari Parrillo Annabelle Harman

## 2023-06-12 ENCOUNTER — Ambulatory Visit

## 2023-06-12 ENCOUNTER — Inpatient Hospital Stay (HOSPITAL_COMMUNITY)
Admit: 2023-06-12 | Discharge: 2023-06-12 | Disposition: A | Discharge status: Home / Self Care | Attending: Cardiovascular Disease | Admitting: Cardiovascular Disease

## 2023-06-12 ENCOUNTER — Inpatient Hospital Stay

## 2023-06-12 DIAGNOSIS — R570 Cardiogenic shock: Secondary | ICD-10-CM

## 2023-06-12 DIAGNOSIS — E872 Acidosis, unspecified: Secondary | ICD-10-CM

## 2023-06-12 DIAGNOSIS — I469 Cardiac arrest, cause unspecified: Secondary | ICD-10-CM

## 2023-06-12 DIAGNOSIS — G931 Anoxic brain damage, not elsewhere classified: Secondary | ICD-10-CM | POA: Diagnosis not present

## 2023-06-12 DIAGNOSIS — R569 Unspecified convulsions: Secondary | ICD-10-CM | POA: Diagnosis not present

## 2023-06-12 DIAGNOSIS — E119 Type 2 diabetes mellitus without complications: Secondary | ICD-10-CM

## 2023-06-12 DIAGNOSIS — A419 Sepsis, unspecified organism: Secondary | ICD-10-CM

## 2023-06-12 DIAGNOSIS — I4901 Ventricular fibrillation: Principal | ICD-10-CM

## 2023-06-12 DIAGNOSIS — N179 Acute kidney failure, unspecified: Secondary | ICD-10-CM

## 2023-06-12 DIAGNOSIS — J9601 Acute respiratory failure with hypoxia: Secondary | ICD-10-CM

## 2023-06-12 DIAGNOSIS — G9341 Metabolic encephalopathy: Secondary | ICD-10-CM

## 2023-06-12 DIAGNOSIS — F10932 Alcohol use, unspecified with withdrawal with perceptual disturbance: Principal | ICD-10-CM

## 2023-06-12 LAB — COOXEMETRY PANEL
Carboxyhemoglobin: 1.3 % (ref 0.5–1.5)
Methemoglobin: <0.7 % (ref 0.0–1.5)
O2 Saturation: 86.6 %
Total hemoglobin: 16.9 g/dL — ABNORMAL HIGH (ref 12.0–16.0)
Total oxygen content: 85.1 %

## 2023-06-12 LAB — BASIC METABOLIC PANEL
Anion gap: 13 (ref 5–15)
BUN: 18 mg/dL (ref 6–20)
CO2: 24 mmol/L (ref 22–32)
Calcium: 8.4 mg/dL — ABNORMAL LOW (ref 8.9–10.3)
Chloride: 101 mmol/L (ref 98–111)
Creatinine, Ser: 1.47 mg/dL — ABNORMAL HIGH (ref 0.44–1.00)
GFR, Estimated: 47 mL/min — ABNORMAL LOW (ref >60)
Glucose, Bld: 154 mg/dL — ABNORMAL HIGH (ref 70–99)
Potassium: 3.9 mmol/L (ref 3.5–5.1)
Sodium: 138 mmol/L (ref 135–145)

## 2023-06-12 LAB — ECHOCARDIOGRAM COMPLETE
AR max vel: 3.22 cm2
AV Area VTI: 3.37 cm2
AV Area mean vel: 3.18 cm2
AV Mean grad: 3 mmHg
AV Peak grad: 5.2 mmHg
Ao pk vel: 1.14 m/s
Area-P 1/2: 6.54 cm2
Calc EF: 18.7 %
Height: 63 in
S' Lateral: 3.2 cm
Single Plane A2C EF: 29.6 %
Single Plane A4C EF: 15.5 %
Weight: 2684.32 [oz_av]

## 2023-06-12 LAB — CERULOPLASMIN: Ceruloplasmin: 38.2 mg/dL (ref 19.0–39.0)

## 2023-06-12 LAB — GLUCOSE, CAPILLARY
Glucose-Capillary: 96 mg/dL (ref 70–99)
Glucose-Capillary: 113 mg/dL — ABNORMAL HIGH (ref 70–99)
Glucose-Capillary: 151 mg/dL — ABNORMAL HIGH (ref 70–99)
Glucose-Capillary: 128 mg/dL — ABNORMAL HIGH (ref 70–99)
Glucose-Capillary: 112 mg/dL — ABNORMAL HIGH (ref 70–99)
Glucose-Capillary: 116 mg/dL — ABNORMAL HIGH (ref 70–99)

## 2023-06-12 LAB — LACTIC ACID, PLASMA
Lactic Acid, Venous: 2.6 mmol/L (ref 0.5–1.9)
Lactic Acid, Venous: 3.2 mmol/L (ref 0.5–1.9)
Lactic Acid, Venous: 3.5 mmol/L (ref 0.5–1.9)
Lactic Acid, Venous: 3.5 mmol/L (ref 0.5–1.9)

## 2023-06-12 LAB — VITAMIN D 25 HYDROXY (VIT D DEFICIENCY, FRACTURES): Vit D, 25-Hydroxy: 34.8 ng/mL (ref 30.0–100.0)

## 2023-06-12 LAB — COMPREHENSIVE METABOLIC PANEL
ALT: 32 U/L (ref 0–44)
AST: 132 U/L — ABNORMAL HIGH (ref 15–41)
Albumin: 2.2 g/dL — ABNORMAL LOW (ref 3.5–5.0)
Alkaline Phosphatase: 183 U/L — ABNORMAL HIGH (ref 38–126)
Anion gap: 13 (ref 5–15)
BUN: 17 mg/dL (ref 6–20)
CO2: 27 mmol/L (ref 22–32)
Calcium: 8.4 mg/dL — ABNORMAL LOW (ref 8.9–10.3)
Chloride: 100 mmol/L (ref 98–111)
Creatinine, Ser: 1.59 mg/dL — ABNORMAL HIGH (ref 0.44–1.00)
GFR, Estimated: 43 mL/min — ABNORMAL LOW (ref >60)
Glucose, Bld: 135 mg/dL — ABNORMAL HIGH (ref 70–99)
Potassium: 3.1 mmol/L — ABNORMAL LOW (ref 3.5–5.1)
Sodium: 140 mmol/L (ref 135–145)
Total Bilirubin: 3.2 mg/dL — ABNORMAL HIGH (ref 0.0–1.2)
Total Protein: 6.3 g/dL — ABNORMAL LOW (ref 6.5–8.1)

## 2023-06-12 LAB — ANTI-SMOOTH MUSCLE ANTIBODY, IGG: Smooth Muscle Ab: 22 U — ABNORMAL HIGH (ref 0–19)

## 2023-06-12 LAB — BLOOD GAS, ARTERIAL
Acid-Base Excess: 7 mmol/L — ABNORMAL HIGH (ref 0.0–2.0)
Bicarbonate: 30.2 mmol/L — ABNORMAL HIGH (ref 20.0–28.0)
FIO2: 30 %
MECHVT: 500 mL
Mechanical Rate: 18
O2 Saturation: 97.1 %
PEEP: 5 cmH2O
Patient temperature: 37
pCO2 arterial: 37 mmHg (ref 32–48)
pH, Arterial: 7.52 — ABNORMAL HIGH (ref 7.35–7.45)
pO2, Arterial: 86 mmHg (ref 83–108)

## 2023-06-12 LAB — CBC
HCT: 40.4 % (ref 36.0–46.0)
Hemoglobin: 13.2 g/dL (ref 12.0–15.0)
MCH: 32.1 pg (ref 26.0–34.0)
MCHC: 32.7 g/dL (ref 30.0–36.0)
MCV: 98.3 fL (ref 80.0–100.0)
Platelets: 179 K/uL (ref 150–400)
RBC: 4.11 MIL/uL (ref 3.87–5.11)
RDW: 18 % — ABNORMAL HIGH (ref 11.5–15.5)
WBC: 15.6 K/uL — ABNORMAL HIGH (ref 4.0–10.5)
nRBC: 0.1 % (ref 0.0–0.2)

## 2023-06-12 LAB — URINE CULTURE: Culture: NO GROWTH

## 2023-06-12 LAB — HEPARIN LEVEL (UNFRACTIONATED)
Heparin Unfractionated: 0.24 [IU]/mL — ABNORMAL LOW (ref 0.30–0.70)
Heparin Unfractionated: 0.32 [IU]/mL (ref 0.30–0.70)

## 2023-06-12 LAB — PHOSPHORUS: Phosphorus: 2.6 mg/dL (ref 2.5–4.6)

## 2023-06-12 LAB — ANA: ANA Titer 1: NEGATIVE

## 2023-06-12 LAB — MAGNESIUM: Magnesium: 1.8 mg/dL (ref 1.7–2.4)

## 2023-06-12 LAB — ALPHA-1-ANTITRYPSIN: A-1 Antitrypsin: 211 mg/dL — ABNORMAL HIGH (ref 100–188)

## 2023-06-12 LAB — MITOCHONDRIAL ANTIBODIES: Mitochondrial Ab: <20 U (ref 0.0–20.0)

## 2023-06-12 MED ORDER — ALBUMIN HUMAN 25 % IV SOLN
37.5000 g | Freq: Once | INTRAVENOUS | Status: AC
  Administered 2023-06-12: 37.5 g via INTRAVENOUS
  Filled 2023-06-12: qty 150

## 2023-06-12 MED ORDER — DOCUSATE SODIUM 50 MG/5ML PO LIQD
100.0000 mg | Freq: Two times a day (BID) | ORAL | Status: DC
  Administered 2023-06-13: 100 mg
  Filled 2023-06-12: qty 10

## 2023-06-12 MED ORDER — POTASSIUM CHLORIDE 20 MEQ PO PACK
40.0000 meq | PACK | Freq: Once | ORAL | Status: AC
  Administered 2023-06-12: 40 meq via ORAL
  Filled 2023-06-12: qty 2

## 2023-06-12 MED ORDER — ASPIRIN 81 MG PO CHEW
324.0000 mg | CHEWABLE_TABLET | Freq: Once | ORAL | Status: AC
  Administered 2023-06-12: 324 mg
  Filled 2023-06-12: qty 4

## 2023-06-12 MED ORDER — LACTULOSE 10 GM/15ML PO SOLN
20.0000 g | Freq: Three times a day (TID) | ORAL | Status: DC
  Administered 2023-06-12 – 2023-06-14 (×6): 20 g
  Filled 2023-06-12 (×6): qty 30

## 2023-06-12 MED ORDER — SODIUM CHLORIDE 0.9 % IV SOLN
2.0000 g | Freq: Two times a day (BID) | INTRAVENOUS | Status: DC
  Administered 2023-06-12 – 2023-06-14 (×4): 2 g via INTRAVENOUS
  Filled 2023-06-12 (×5): qty 12.5

## 2023-06-12 MED ORDER — HEPARIN BOLUS VIA INFUSION
1000.0000 [IU] | Freq: Once | INTRAVENOUS | Status: AC
  Administered 2023-06-12: 1000 [IU] via INTRAVENOUS
  Filled 2023-06-12: qty 1000

## 2023-06-12 MED ORDER — POTASSIUM CHLORIDE 10 MEQ/100ML IV SOLN
10.0000 meq | INTRAVENOUS | Status: AC
  Administered 2023-06-12 (×6): 10 meq via INTRAVENOUS
  Filled 2023-06-12 (×6): qty 100

## 2023-06-12 MED ORDER — ASPIRIN 81 MG PO CHEW
81.0000 mg | CHEWABLE_TABLET | Freq: Every day | ORAL | Status: DC
  Administered 2023-06-13 – 2023-06-14 (×2): 81 mg
  Filled 2023-06-12 (×2): qty 1

## 2023-06-12 MED ORDER — HEPARIN SODIUM (PORCINE) 5000 UNIT/ML IJ SOLN
5000.0000 [IU] | Freq: Three times a day (TID) | INTRAMUSCULAR | Status: DC
  Administered 2023-06-12 – 2023-06-14 (×6): 5000 [IU] via SUBCUTANEOUS
  Filled 2023-06-12 (×6): qty 1

## 2023-06-12 NOTE — Progress Notes (Signed)
 PHARMACY - ANTICOAGULATION CONSULT NOTE  Pharmacy Consult for heparin infusion Indication: chest pain/ACS  Allergies  Allergen Reactions   Penicillins Swelling    Tolerated ceftriaxone and cefepime in past per [facility] and [facility] records   Patient Measurements: Height: 5\' 3"  (160 cm) Weight: 71.1 kg (156 lb 12 oz) IBW/kg (Calculated) : 52.4 Heparin Dosing Weight: 69 kg  Vital Signs: Temp: 99.1 F (37.3 C) (03/04 0600) Temp Source: Esophageal (03/04 0400) BP: 96/70 (03/04 0600) Pulse Rate: 127 (03/04 0600)  Labs: Recent Labs    06/10/23 1129 06/10/23 1344 06/10/23 1600 06/10/23 1831 06/10/23 2248 06/11/23 0351 06/11/23 0558 06/11/23 1506 06/11/23 1807 06/11/23 2246 06/12/23 0400  HGB 15.2*  --   --   --   --  13.6  --   --   --   --  13.2  HCT 46.7*  --   --   --   --  41.6  --   --   --   --  40.4  PLT 236  --   --   --   --  220  --   --   --   --  179  APTT  --   --  37*  --   --   --   --   --   --   --   --   LABPROT  --   --  17.1*  --   --   --   --   --   --   --   --   INR  --   --  1.4*  --   --   --   --   --   --   --   --   HEPARINUNFRC  --   --   --   --    < >  --    < > 0.26*  --  0.35 0.24*  CREATININE 1.62*  --   --  1.76*  --  1.78*  --   --  1.60*  --  1.59*  CKTOTAL  --  481*  --   --   --   --   --   --   --   --   --   TROPONINIHS 968* 11,582*  --  >24,000*  --   --   --   --   --   --   --    < > = values in this interval not displayed.    Estimated Creatinine Clearance: 45.8 mL/min (A) (by C-G formula based on SCr of 1.59 mg/dL (H)).  Medical History: Past Medical History:  Diagnosis Date   Alcohol abuse    Anxiety    Biliary calculi 12/07/2009   Cholelithiasis    Depression    Diet-controlled diabetes mellitus (HCC)    GERD (gastroesophageal reflux disease)    Hepatitis C    body "self healed" per patient   LVH (left ventricular hypertrophy)    a. 10/2022 Echo: EF 60-65%, no rwma, sev conc LVH, nl RV size/fxn, triv MR. Mobile density  near ant MV leaflet- felt to be redundant chordae tendinae..   Morbid obesity (HCC)    Palpitations    a. 07/2019 Holter: Sinus rhythm, PVCs, rare PACs.  No significant arrhythmias.   Pancreatic pseudocyst    a. 02/2023 MRI abd: improving unilocular pancreatic pseudocyst.   Retained intrauterine contraceptive device (IUD) 01/20/2020   Substance abuse (HCC)    Medications:  Scheduled:   Chlorhexidine Gluconate Cloth  6 each Topical Q0600   feeding supplement (PROSource TF20)  60 mL Per Tube BID   fentaNYL (SUBLIMAZE) injection  50 mcg Intravenous Once   folic acid  1 mg Intravenous Daily   free water  30 mL Per Tube Q4H   insulin aspart  0-9 Units Subcutaneous Q4H   lactulose  30 g Per Tube TID   multivitamin  15 mL Per Tube Daily   mouth rinse  15 mL Mouth Rinse Q2H   pantoprazole (PROTONIX) IV  40 mg Intravenous Q24H   [START ON 06/12/2023] thiamine (VITAMIN B1) injection  100 mg Intravenous Daily   Assessment: 38 yo female with a PMH of ETOH abuse, seizure activity secondary to ETOH withdrawal, and alcoholic liver cirrhosis. She is on no chronic anticoagulation PTA based on dispens history. Pharmacy was consulted to dose this patient's heparin.   Baseline Labs:  Hgb15.2, PLT wnl, INR/aPTT pending  Goal of Therapy:  Heparin level 0.3-0.7 units/ml Monitor platelets by anticoagulation protocol: Yes  Heparin Levels Date/Time  HL  Clinical Assessment 3/3@0558    0.27  SUBtherapeutic 3/3@1506    0.26  SUBtherapeutic 3/3 2246  0.35  Therapeutic x 1 3/4 0400  0.24  Subtherapeutic    Plan:  -- Bolus 1000 units x 1 -- Increase heparin drip rate at 1400 units/hr.  -- Recheck HL in 6 hrs after rate change -- Planned to stop heparin at 1600 on 3/4 (48 hour stop time per cardiology) -- Continue to follow CBC and platelets with AM labs   Otelia Sergeant, PharmD, [facility] 06/12/2023 6:03 AM

## 2023-06-12 NOTE — Progress Notes (Signed)
 NAME:  Katie Stanley, MRN:  [id number], DOB:  October 02, 1985, LOS: 2 ADMISSION DATE:  06/10/2023, CONSULTATION DATE:  06/10/23 REFERRING MD:  Dr. Vicente Males, CHIEF COMPLAINT:  Cardiac Arrest   Brief Pt Description / Synopsis:  38 y.o. female with PMHx significant for ETOH abuse, ETOH withdrawal seizures and alcoholic liver cirrhosis admitted following out-of-hospital V. Fib Cardiac Arrest (approximately 25 minutes of ACLS prior to ROSC), NSTEMI, multifactorial Shock, suspected aspiration pneumonia, AKI, and severe myoclonus with concern for possible anoxic brain injury.  History of Present Illness:  This is a 38 yo female with a PMH of ETOH abuse, seizure activity secondary to ETOH withdrawal, and alcoholic liver cirrhosis.  She presented to [facility] ER on 03/2 via EMS from home after pts family witnessed the pt having seizure-like activity (staring spell followed by abnormal respirations then tensing up with abnormal respirations followed by "shaking activity" resulting in tongue trauma) and subsequent loss of consciousness.  Per ER notes EMS reported pt v fib arrested requiring epi x4, amiodarone load, 2 amps of bicarb, and defibrillation x1 prior to ROSC after 23 minutes.  Pts significant other reported to EDP the pt has had heavy alcohol abuse over the past week with 1/2 pint of liquor daily.  She attempted to stop over the last few days and initially had seizure activity on 03/1.  Therefore, pt had an alcoholic beverage following the seizure activity in an attempt to prevent recurrent seizure activity.  However, the pt did not have an alcoholic beverage today.     ED Course  Upon arrival to the ER pt severely hypotensive despite IV fluids resuscitation requiring 1 amp of epi followed by levophed gtt.  Due to inability to protect airway pt mechanically intubated for airway protection.  EKG revealed sinus rhythm, hr 78 without signs of ST elevation.  CXR revealed no acute cardiopulmonary abnormality.  Significant  lab results were: K+ 2.5/chloride 88/CO2 19/glucose 243/creatinine 1.62/ anion gap 32/lactic acid >9.0/wbc 17.3.  CT Head/Cervical Spine negative for acute intracranial abnormality.  Pt received 2 grams of iv keppra.    PCCM asked to admit for further workup and treatment.  Please see "Significant Hospital Events" section below for full detailed hospital course.   Pertinent  Medical History   Past Medical History:  Diagnosis Date   Alcohol abuse    Anxiety    Biliary calculi 12/07/2009   Cholelithiasis    Depression    Diet-controlled diabetes mellitus (HCC)    GERD (gastroesophageal reflux disease)    Hepatitis C    body "self healed" per patient   LVH (left ventricular hypertrophy)    a. 10/2022 Echo: EF 60-65%, no rwma, sev conc LVH, nl RV size/fxn, triv MR. Mobile density near ant MV leaflet- felt to be redundant chordae tendinae..   Morbid obesity (HCC)    Palpitations    a. 07/2019 Holter: Sinus rhythm, PVCs, rare PACs.  No significant arrhythmias.   Pancreatic pseudocyst    a. 02/2023 MRI abd: improving unilocular pancreatic pseudocyst.   Retained intrauterine contraceptive device (IUD) 01/20/2020   Substance abuse (HCC)     Micro Data:  03/2: Resp panel by RT>>negative  03/2: Blood x2>> no growth to date 03/2: MRSA PCR>> negative 03/2: Tracheal aspirate>> gram + cocci & gram - rods 03/2: Urine culture>> no growth  Antimicrobials:   Anti-infectives (From admission, onward)    Start     Dose/Rate Route Frequency Ordered Stop   06/11/23 1200  vancomycin (VANCOREADY) IVPB 750  mg/150 mL  Status:  Discontinued        750 mg 150 mL/hr over 60 Minutes Intravenous Every 24 hours 06/10/23 1512 06/11/23 1036   06/10/23 1800  metroNIDAZOLE (FLAGYL) IVPB 500 mg        500 mg 100 mL/hr over 60 Minutes Intravenous Every 12 hours 06/10/23 1506     06/10/23 1700  vancomycin (VANCOREADY) IVPB 1500 mg/300 mL        1,500 mg 150 mL/hr over 120 Minutes Intravenous  Once 06/10/23  1507 06/10/23 2331   06/10/23 1600  cefTAZidime (FORTAZ) 2 g in sodium chloride 0.9 % 100 mL IVPB        2 g 200 mL/hr over 30 Minutes Intravenous Every 12 hours 06/10/23 1506         Significant Hospital Events: Including procedures, antibiotic start and stop dates in addition to other pertinent events   03/2: Admitted to ICU with hypotension (+/- septic and/or hypovolemic shock) acute encephalopathy following seizure-activity suspected secondary to ETOH withdrawal requiring mechanical intubation  03/03: Overnight with severe myoclonus requiring increasing Versed gtt to 20 mg/hr, will attempt to transition to Propofol gtt.  Critically ill with multiorgan failure.  Neurology following, plan for repeat EEG and MRI Brain today.  Cardiology consulted for NSTEMI.  MRI concerning for anoxic brain injury. 03/04: No significant events noted overnight.  Myoclonus resolved with addition of propofol. Neuro exam remains poor and unchanged off Propofol.  Repeating EEG. Consult Palliative Care for goals of care conversations.  Interim History / Subjective:  As outlined above under "Significant Hospital Events" section  Objective   Blood pressure 98/69, pulse (!) 124, temperature 99.3 F (37.4 C), resp. rate 18, height 5\' 3"  (1.6 m), weight 71.1 kg, SpO2 94%.    Vent Mode: PRVC FiO2 (%):  [30 %] 30 % Set Rate:  [18 bmp] 18 bmp Vt Set:  [500 mL] 500 mL PEEP:  [5 cmH20] 5 cmH20 Plateau Pressure:  [19 cmH20-22 cmH20] 21 cmH20   Intake/Output Summary (Last 24 hours) at 06/12/2023 0912 Last data filed at 06/12/2023 0535 Gross per 24 hour  Intake 2184.32 ml  Output 515 ml  Net 1669.32 ml   Filed Weights   06/10/23 1521 06/11/23 0315 06/12/23 0310  Weight: 77.6 kg 76.1 kg 71.1 kg    Examination: General: Critically on chronically-ill appearing female, NAD mechanically intubated and sedated HENT: Supple, no JVD, orally intubated Lungs: Coarse breath sounds throughout, even, non labored, synchronous  with vent Cardiovascular: Tachycardia, regular rhythm, s1s2, no m/r/g, 2+ radial/1+ distal pulses Abdomen: +BS x4, obese, soft, non distended Extremities: Normal bulk and tone, no deformities, trace edema bilateral LE Neuro: Sedation off for 45 minutes, unresponsive, no corneal reflex, no respiratory drive above vent, unable to pass suction catheter in ETT to check for cough/gag reflex due to biting ETT.  PERRL  GU: Indwelling foley catheter draining dark yellow urine   Resolved Hospital Problem list     Assessment & Plan:   #Out-of-Hospital V. Fib Cardiac arrest, suspect secondary to electrolyte abnormalities vs seizures #NSTEMI #Multifactorial Shock: Hypovolemic +/- Cardiogenic +/- Septic Echocardiogram 06/12/23:  LVEF 25-30%, mild LVH, indeterminate diastolic parameters, RV systolic function normal, RV size is normal -Continuous cardiac monitoring -Maintain MAP >65 -Cautious IV fluids -Vasopressors as needed to maintain MAP goal -Trend lactic acid until normalized -Trend HS Troponin until peaked -Cardiology following, appreciate input -Continue Heparin gtt for 48 hrs duration  #Acute Hypoxic Respiratory Failure #Suspected Aspiration Pneumonia -Full vent support,  implement lung protective strategies -Plateau pressures less than 30 cm H20 -Wean FiO2 & PEEP as tolerated to maintain O2 sats >92% -Follow intermittent Chest X-ray & ABG as needed -Spontaneous Breathing Trials when respiratory parameters met and mental status permits -Implement VAP Bundle -Prn Bronchodilators -ABX as above  #Sepsis due to ... #Suspected Aspiration Pneumonia -Monitor fever curve -Trend WBC's & Procalcitonin -Follow cultures as above -Continue empiric Ceftazidime and Flagyl (d/c Vancomycin) pending cultures & sensitivities  #Acute kidney injury secondary to ATN  #Hypokalemia  #Anion gap metabolic acidosis ~ RESOLVED #Severe lactic acidosis  -Monitor I&O's / urinary output -Follow BMP -Ensure  adequate renal perfusion -Avoid nephrotoxic agents as able -Replace electrolytes as indicated ~ Pharmacy following for assistance with electrolyte replacement  #Alcoholic cirrhosis  -Trend hepatic function panel and coags -Avoid hepatoxic agents    #Type II diabetes mellitus  -Check Hemoglobin A1C -CBG's q4h; Target range of 140 to 180 -SSI -Follow ICU Hypo/Hyperglycemia protocol  #Acute metabolic encephalopathy  #Concern for possible anoxic injury  #Myoclonus vs. seizure activity (alcohol withdrawal)  #Mechanical intubation pain/discomfort  Hx: Polysubstance abuse, anxiety, and depression MRI Brain 3/3 concerning for anoxic brain injury -Treatment of metabolic derangements as outlined above -Normothermia protocol -Maintain a RASS goal of 0 to -1 -Fentanyl and Propofol as needed to maintain RASS goal  -Avoid sedating medications as able -Daily wake up assessment -Neurology following, appreciate input -Repeat EEG is pending -Obtain MRI Brain -Continue Keppra 1g BID        Pt is critically ill with multiorgan failure. Prognosis is guarded, high risk for further decompensation, cardiac arrest, and death.  Prognosis is guarded.  Given current critical illness superimposed on prolonged cardiac arrest with concern for anoxic brain injury and multiple chronic co-morbidities, overall long term prognosis is poor.  Recommend consideration of DNR/DNI status and transition to comfort measures after allowing 72 hrs for Neuro prognostication.  Will consult Palliative Care to assist with GOC discussions.  Best Practice (right click and "Reselect all SmartList Selections" daily)   Diet/type: NPO, tube feeds DVT prophylaxis: systemic heparin GI prophylaxis: PPI Lines: Central line and yes and it is still needed Foley:  Yes, and it is still needed Code Status:  full code Last date of multidisciplinary goals of care discussion [3/4]  3/4: Will update family when they arrive at bedside  on plan of care.  Labs   CBC: Recent Labs  Lab 06/10/23 1129 06/11/23 0351 06/12/23 0400  WBC 17.3* 19.0* 15.6*  HGB 15.2* 13.6 13.2  HCT 46.7* 41.6 40.4  MCV 98.3 98.1 98.3  PLT 236 220 179    Basic Metabolic Panel: Recent Labs  Lab 06/10/23 1129 06/10/23 1447 06/10/23 1831 06/10/23 2139 06/11/23 0351 06/11/23 1558 06/11/23 1807 06/12/23 0400  NA 139  --  140  --  137  --  140 140  K 2.5*  --  3.2* 3.3* 3.0*  --  3.0* 3.1*  CL 88*  --  94*  --  96*  --  97* 100  CO2 19*  --  22  --  23  --  27 27  GLUCOSE 243*  --  134*  --  146*  --  94 135*  BUN 10  --  11  --  13  --  15 17  CREATININE 1.62*  --  1.76*  --  1.78*  --  1.60* 1.59*  CALCIUM 9.9  --  8.6*  --  8.5*  --  8.8* 8.4*  MG  --  1.5*  --  2.3 2.2 2.0  --  1.8  PHOS  --  7.3*  --   --  4.6 3.4  --  2.6   GFR: Estimated Creatinine Clearance: 45.8 mL/min (A) (by C-G formula based on SCr of 1.59 mg/dL (H)). Recent Labs  Lab 06/10/23 1129 06/10/23 1306 06/10/23 1447 06/10/23 1730 06/10/23 2006 06/11/23 0351 06/11/23 0810 06/11/23 1135 06/12/23 0400  PROCALCITON  --   --  0.78  --   --   --   --   --   --   WBC 17.3*  --   --   --   --  19.0*  --   --  15.6*  LATICACIDVEN >9.0*   < >  --  >9.0* >9.0*  --  4.5* 3.0*  --    < > = values in this interval not displayed.    Liver Function Tests: Recent Labs  Lab 06/10/23 1156 06/12/23 0400  AST 226* 132*  ALT 38 32  ALKPHOS 263* 183*  BILITOT 3.4* 3.2*  PROT 6.9 6.3*  ALBUMIN 2.7* 2.2*   Recent Labs  Lab 06/10/23 1344  LIPASE 69*  AMYLASE 194*   Recent Labs  Lab 06/10/23 1447  AMMONIA 218*    ABG    Component Value Date/Time   PHART 7.52 (H) 06/12/2023 0457   PCO2ART 37 06/12/2023 0457   PO2ART 86 06/12/2023 0457   HCO3 30.2 (H) 06/12/2023 0457   ACIDBASEDEF 7.9 (H) 06/10/2023 1146   O2SAT 97.1 06/12/2023 0457     Coagulation Profile: Recent Labs  Lab 06/10/23 1600  INR 1.4*    Cardiac Enzymes: Recent Labs  Lab  06/10/23 1344  CKTOTAL 481*    HbA1C: HB A1C (BAYER DCA - WAIVED)  Date/Time Value Ref Range Status  02/05/2023 03:03 PM 6.1 (H) 4.8 - 5.6 % Final    Comment:             Prediabetes: 5.7 - 6.4          Diabetes: >6.4          Glycemic control for adults with diabetes: <7.0   10/01/2018 04:30 PM 5.3 <7.0 % Final    Comment:                                          Diabetic Adult            <7.0                                       Healthy Adult        4.3 - 5.7                                                           (DCCT/NGSP) American Diabetes Association's Summary of Glycemic Recommendations for Adults with Diabetes: Hemoglobin A1c <7.0%. More stringent glycemic goals (A1c <6.0%) may further reduce complications at the cost of increased risk of hypoglycemia.    Hgb A1c MFr Bld  Date/Time Value Ref Range Status  06/10/2023 02:47 PM 5.3 4.8 -  5.6 % Final    Comment:    (NOTE) Pre diabetes:          5.7%-6.4%  Diabetes:              >6.4%  Glycemic control for   <7.0% adults with diabetes   10/27/2022 05:04 PM 6.6 (H) 4.8 - 5.6 % Final    Comment:    (NOTE)         Prediabetes: 5.7 - 6.4         Diabetes: >6.4         Glycemic control for adults with diabetes: <7.0     CBG: Recent Labs  Lab 06/11/23 1714 06/11/23 1925 06/11/23 2329 06/12/23 0319 06/12/23 0753  GLUCAP 95 80 91 96 113*    Review of Systems:   Unable to assess due to AMS/sedation/intubation/critical illness   Past Medical History:  She,  has a past medical history of Alcohol abuse, Anxiety, Biliary calculi (12/07/2009), Cholelithiasis, Depression, Diet-controlled diabetes mellitus (HCC), GERD (gastroesophageal reflux disease), Hepatitis C, LVH (left ventricular hypertrophy), Morbid obesity (HCC), Palpitations, Pancreatic pseudocyst, Retained intrauterine contraceptive device (IUD) (01/20/2020), and Substance abuse (HCC).   Surgical History:   Past Surgical History:  Procedure  Laterality Date   CHOLECYSTECTOMY     2011   COLONOSCOPY WITH PROPOFOL N/A 09/20/2022   Procedure: COLONOSCOPY WITH PROPOFOL;  Surgeon: Midge Minium, MD;  Location: [facility] ENDOSCOPY;  Service: Endoscopy;  Laterality: N/A;   ESOPHAGOGASTRODUODENOSCOPY N/A 09/29/2021   Procedure: ESOPHAGOGASTRODUODENOSCOPY (EGD);  Surgeon: Midge Minium, MD;  Location: [facility]OSCOPY;  Service: Endoscopy;  Laterality: N/A;   ESOPHAGOGASTRODUODENOSCOPY (EGD) WITH PROPOFOL N/A 01/21/2018   Procedure: ESOPHAGOGASTRODUODENOSCOPY (EGD) WITH Biopsy;  Surgeon: Midge Minium, MD;  Location: [facility];  Service: Endoscopy;  Laterality: N/A;   HYSTEROSCOPY WITH D & C N/A 01/20/2020   Procedure: DILATATION AND CURETTAGE /HYSTEROSCOPY;  Surgeon: Conard Novak, MD;  Location: [facility] ORS;  Service: Gynecology;  Laterality: N/A;   INTRAUTERINE DEVICE (IUD) INSERTION N/A 01/20/2020   Procedure: INTRAUTERINE DEVICE (IUD) INSERTION MIRENA IUD;  Surgeon: Conard Novak, MD;  Location: [facility] ORS;  Service: Gynecology;  Laterality: N/A;   IUD REMOVAL N/A 01/20/2020   Procedure: INTRAUTERINE DEVICE (IUD) REMOVAL;  Surgeon: Conard Novak, MD;  Location: [facility] ORS;  Service: Gynecology;  Laterality: N/A;   PORT-A-CATH REMOVAL  02/08/2022   PORTA CATH INSERTION N/A 01/01/2019   Procedure: PORTA CATH INSERTION;  Surgeon: Annice Needy, MD;  Location: [facility] INVASIVE CV LAB;  Service: Cardiovascular;  Laterality: N/A;   VASCULAR SURGERY       Social History:   reports that she has been smoking cigarettes. She has a 12 pack-year smoking history. She has never used smokeless tobacco. She reports current alcohol use. She reports current drug use. Drug: Marijuana.   Family History:  Her family history includes Breast cancer in her paternal grandmother; Diabetes in her father; Heart disease in her paternal grandfather; Hypertension in her father, maternal grandfather, and mother; Liver cancer in her maternal grandmother.    Allergies Allergies  Allergen Reactions   Penicillins Swelling    Tolerated ceftriaxone and cefepime in past per [facility] records     Home Medications  Prior to Admission medications   Medication Sig Start Date End Date Taking? Authorizing Provider  acetaminophen (TYLENOL) 500 MG tablet Take 1 tablet (500 mg total) by mouth 3 (three) times daily as needed for headache, fever or moderate pain (pain score 4-6). 04/18/23  Yes Loyce Dys, MD  citalopram (CELEXA) 20 MG tablet TAKE 1.5 TABLET(20 MG) BY MOUTH DAILY 02/20/23  Yes Johnson, Megan P, DO  famotidine (PEPCID) 20 MG tablet Take 20 mg by mouth 2 (two) times daily.   Yes [provider]  fluticasone furoate-vilanterol (BREO ELLIPTA) 200-25 MCG/ACT AEPB Inhale 1 puff into the lungs daily. 04/19/23  Yes Loyce Dys, MD  furosemide (LASIX) 40 MG tablet Take 1 tablet (40 mg total) by mouth 2 (two) times daily. 05/08/23  Yes Johnson, Megan P, DO  gabapentin (NEURONTIN) 300 MG capsule Take 1 capsule (300 mg total) by mouth at bedtime. Increase to twice daily if able to tolerate without any drowsiness 06/05/23  Yes Vanga, Loel Dubonnet, MD  Iron, Ferrous Sulfate, 325 (65 Fe) MG TABS Take 325 mg by mouth daily. 03/07/23  Yes Johnson, Megan P, DO  lactulose (CHRONULAC) 10 GM/15ML solution TAKE 15 MLS BY MOUTH DAILY 05/23/23  Yes Johnson, Megan P, DO  lidocaine (LIDODERM) 5 % Place 1 patch onto the skin daily. Remove & Discard patch within 12 hours or as directed by MD 04/28/22  Yes Johnson, Megan P, DO  MAGNESIUM PO Take 1 each by mouth in the morning and at bedtime. OTC patient is unsure of the dost   Yes [provider]  methocarbamol (ROBAXIN) 500 MG tablet Take 1 tablet (500 mg total) by mouth 2 (two) times daily. 05/08/23  Yes Johnson, Megan P, DO  metoprolol succinate (TOPROL-XL) 25 MG 24 hr tablet Take 1 tablet (25 mg total) by mouth daily. 02/20/23  Yes Johnson, Megan P, DO  Multiple Vitamin (MULTIVITAMIN WITH MINERALS)  TABS tablet Take 1 tablet by mouth daily. 02/13/23  Yes Regalado, Belkys A, MD  pantoprazole (PROTONIX) 40 MG tablet Take 1 tablet (40 mg total) by mouth 2 (two) times daily before a meal. 05/08/23 06/10/23 Yes Johnson, Megan P, DO  potassium chloride SA (KLOR-CON M) 20 MEQ tablet TAKE 1 TABLET(20 MEQ) BY MOUTH DAILY 03/20/23  Yes Johnson, Megan P, DO  folic acid (FOLVITE) 1 MG tablet Take 1 tablet (1 mg total) by mouth daily. Patient not taking: Reported on 06/10/2023 02/13/23   Hartley Barefoot A, MD  spironolactone (ALDACTONE) 50 MG tablet Take 1 tablet (50 mg total) by mouth daily. Patient not taking: Reported on 06/07/2023 05/08/23   Dorcas Carrow, DO     Critical care time: 40 minutes     Harlon Ditty, AGACNP-BC [facility] Prefer epic messenger for cross cover needs If after hours, please call E-link

## 2023-06-12 NOTE — Progress Notes (Cosign Needed Addendum)
 PHARMACY - ANTICOAGULATION CONSULT NOTE  Pharmacy Consult for heparin infusion Indication: chest pain/ACS  Allergies  Allergen Reactions   Penicillins Swelling    Tolerated ceftriaxone and cefepime in past per [facility] and [facility] records   Patient Measurements: Height: 5\' 3"  (160 cm) Weight: 71.1 kg (156 lb 12 oz) IBW/kg (Calculated) : 52.4 Heparin Dosing Weight: 69 kg  Vital Signs: Temp: 98.4 F (36.9 C) (03/04 1230) Temp Source: Esophageal (03/04 0400) BP: 96/72 (03/04 1230) Pulse Rate: 115 (03/04 1230)  Labs: Recent Labs    06/10/23 1129 06/10/23 1344 06/10/23 1600 06/10/23 1831 06/10/23 2248 06/11/23 0351 06/11/23 0558 06/11/23 1807 06/11/23 2246 06/12/23 0400 06/12/23 1231  HGB 15.2*  --   --   --   --  13.6  --   --   --  13.2  --   HCT 46.7*  --   --   --   --  41.6  --   --   --  40.4  --   PLT 236  --   --   --   --  220  --   --   --  179  --   APTT  --   --  37*  --   --   --   --   --   --   --   --   LABPROT  --   --  17.1*  --   --   --   --   --   --   --   --   INR  --   --  1.4*  --   --   --   --   --   --   --   --   HEPARINUNFRC  --   --   --   --    < >  --    < >  --  0.35 0.24* 0.32  CREATININE 1.62*  --   --  1.76*  --  1.78*  --  1.60*  --  1.59*  --   CKTOTAL  --  481*  --   --   --   --   --   --   --   --   --   TROPONINIHS 968* 11,582*  --  >24,000*  --   --   --   --   --   --   --    < > = values in this interval not displayed.    Estimated Creatinine Clearance: 45.8 mL/min (A) (by C-G formula based on SCr of 1.59 mg/dL (H)).  Medical History: Past Medical History:  Diagnosis Date   Alcohol abuse    Anxiety    Biliary calculi 12/07/2009   Cholelithiasis    Depression    Diet-controlled diabetes mellitus (HCC)    GERD (gastroesophageal reflux disease)    Hepatitis C    body "self healed" per patient   LVH (left ventricular hypertrophy)    a. 10/2022 Echo: EF 60-65%, no rwma, sev conc LVH, nl RV size/fxn, triv MR. Mobile density  near ant MV leaflet- felt to be redundant chordae tendinae..   Morbid obesity (HCC)    Palpitations    a. 07/2019 Holter: Sinus rhythm, PVCs, rare PACs.  No significant arrhythmias.   Pancreatic pseudocyst    a. 02/2023 MRI abd: improving unilocular pancreatic pseudocyst.   Retained intrauterine contraceptive device (IUD) 01/20/2020   Substance abuse (HCC)    Medications:  Scheduled:   Chlorhexidine Gluconate Cloth  6 each Topical Q0600   feeding supplement (PROSource TF20)  60 mL Per Tube BID   fentaNYL (SUBLIMAZE) injection  50 mcg Intravenous Once   folic acid  1 mg Intravenous Daily   free water  30 mL Per Tube Q4H   insulin aspart  0-9 Units Subcutaneous Q4H   lactulose  20 g Per Tube TID   multivitamin  15 mL Per Tube Daily   mouth rinse  15 mL Mouth Rinse Q2H   pantoprazole (PROTONIX) IV  40 mg Intravenous Q24H   [START ON 06/27/2023] thiamine (VITAMIN B1) injection  100 mg Intravenous Daily   Assessment: 38 yo female with a PMH of ETOH abuse, seizure activity secondary to ETOH withdrawal, and alcoholic liver cirrhosis. She is on no chronic anticoagulation PTA based on dispens history. Pharmacy was consulted to dose this patient's heparin.   Baseline Labs:  Hgb15.2, PLT wnl, INR/aPTT pending  Goal of Therapy:  Heparin level 0.3-0.7 units/ml Monitor platelets by anticoagulation protocol: Yes  Heparin Levels Date/Time  HL  Clinical Assessment 3/3@0558    0.27  SUBtherapeutic 3/3@1506    0.26  SUBtherapeutic 3/3 2246  0.35  Therapeutic x 1 3/4 0400  0.24  Subtherapeutic 3/4@1231   0.32  Therapeutic   Plan:  -- Continue heparin drip rate at 1400 units/hr.  -- Heparin gtt is scheduled to stop at 1600 on 3/4 - 48 hour stop time per cardiology -- Will transition to subcutaneous heparin after the completion of the heparin drip  -- Continue to monitor CBC and platelets daily while on anticoagulation therapy   Effie Shy, PharmD Pharmacy Resident  06/12/2023 1:38 PM

## 2023-06-12 NOTE — Procedures (Signed)
 Patient Name: ULA COUVILLON  MRN: [id number]  Epilepsy Attending: Charlsie Quest  Referring Physician/Provider: Milon Dikes, MD  Date: 06/12/2023 Duration: 28.57 mins   Patient history: 38yo F s/p cardiac arrest. Rapid EEG to evaluate for seizure    Level of alertness:  comatose   AEDs during EEG study: Propofol, Keppra   Technical aspects: This EEG study was done with scalp electrodes positioned according to the 10-20 International system of electrode placement. Electrical activity was reviewed with band pass filter of 1-70Hz , sensitivity of 7 uV/mm, display speed of 20mm/sec with a 60Hz  notched filter applied as appropriate. EEG data were recorded continuously and digitally stored.  Video monitoring was available and reviewed as appropriate.   Description: EEG showed continuous polymorphic mixed frequencies with predominantly 5 to 7 Hz theta slowing admixed with intermittent generalized 2 to 3 Hz delta slowing.  Hyperventilation and photic stimulation were not performed.      ABNORMALITY - Continuous slow, generalized   IMPRESSION: This study is suggestive of moderate to severe diffuse encephalopathy.  No seizures or definite epileptiform discharges were noted during the study.  Keondria Siever Annabelle Harman

## 2023-06-12 NOTE — Progress Notes (Addendum)
 PHARMACY CONSULT NOTE - FOLLOW UP  Pharmacy Consult for Electrolyte Monitoring and Replacement   Recent Labs: Potassium (mmol/L)  Date Value  06/12/2023 3.1 (L)   Magnesium (mg/dL)  Date Value  16/01/9603 1.8   Calcium (mg/dL)  Date Value  54/12/8117 8.4 (L)   Albumin (g/dL)  Date Value  14/78/2956 2.2 (L)  05/08/2023 3.9   Phosphorus (mg/dL)  Date Value  21/30/8657 2.6   Sodium (mmol/L)  Date Value  06/12/2023 140  05/08/2023 140   Assessment: 38 yo female with a PMH of ETOH abuse, seizure activity secondary to ETOH withdrawal, and alcoholic liver cirrhosis. Pharmacy is asked to follow and replace electrolytes while in CCU  Goal of Therapy:  Electrolytes WNL  Plan:  --- Order 10 mEq IV KCl x 6  --- Recheck electrolytes in am  Effie Shy, PharmD Pharmacy Resident  06/12/2023 6:50 AM

## 2023-06-12 NOTE — Progress Notes (Addendum)
 NEUROLOGY CONSULT FOLLOW UP NOTE   Date of service: June 12, 2023 Patient Name: Katie Stanley MRN:  [id number] DOB:  1986-03-10  Interval Hx/subjective  Remains on propofol and fentanyl for sedation with some pressor support. No acute overnight events. Rapid EEG battery running low and had some error messages on the screen-pending read Cardiology following-patient remains on heparin drip for NSTEMI.  Also on aspirin. Got an MRI brain done yesterday to rule out other than anoxic causes of brain damage-MRI reveals extensive anoxic brain injury.  Vitals   Vitals:   06/12/23 0615 06/12/23 0630 06/12/23 0645 06/12/23 0700  BP: 101/62 101/70 102/68 98/69  Pulse: (!) 125 (!) 122 (!) 125 (!) 124  Resp: 18 18 18 18   Temp: 99.1 F (37.3 C) 99.3 F (37.4 C) 99.3 F (37.4 C) 99.3 F (37.4 C)  TempSrc:      SpO2: 96% 97% 96% 94%  Weight:      Height:         Body mass index is 27.77 kg/m.  Physical Exam  General: Sedated on propofol and fentanyl-propofol held for exam briefly HEENT: Normocephalic atraumatic Lungs clear CVS: Tachycardic Respiratory: Vented Neurological exam Sedated intubated Sedation with propofol held for the exam No spontaneous movement noted No spontaneous eye opening like yesterday noted Pupils are slightly unequal with left pupil being 3 mm right pupil being 2 mm but they are round and reactive.  No corneal reflexes.  No cough or gag. Breathing with the ventilator at this time To noxious stimulation, no movement or withdrawal or grimace.  Medications  Current Facility-Administered Medications:    acetaminophen (TYLENOL) tablet 650 mg, 650 mg, Oral, Q4H PRN **OR** acetaminophen (TYLENOL) 160 MG/5ML solution 650 mg, 650 mg, Per Tube, Q4H PRN **OR** acetaminophen (TYLENOL) suppository 650 mg, 650 mg, Rectal, Q4H PRN, Ezequiel Essex, NP   cefTAZidime (FORTAZ) 2 g in sodium chloride 0.9 % 100 mL IVPB, 2 g, Intravenous, Q12H, Ezequiel Essex, NP, Stopped at  06/11/23 2227   Chlorhexidine Gluconate Cloth 2 % PADS 6 each, 6 each, Topical, Q0600, Ezequiel Essex, NP, 6 each at 06/12/23 0928   docusate sodium (COLACE) capsule 100 mg, 100 mg, Oral, BID PRN, Ezequiel Essex, NP   feeding supplement (PROSource TF20) liquid 60 mL, 60 mL, Per Tube, BID, Dgayli, Khabib, MD, 60 mL at 06/12/23 0928   feeding supplement (VITAL 1.5 CAL) liquid 1,000 mL, 1,000 mL, Per Tube, Continuous, Dgayli, Khabib, MD, Last Rate: 35 mL/hr at 06/12/23 0535, Infusion Verify at 06/12/23 0535   fentaNYL (SUBLIMAZE) bolus via infusion 50-100 mcg, 50-100 mcg, Intravenous, Q15 min PRN, Bradler, Clent Jacks, MD   fentaNYL (SUBLIMAZE) injection 50 mcg, 50 mcg, Intravenous, Once, Bradler, Clent Jacks, MD   fentaNYL in NS (47mcg/ml) infusion-PREMIX, 50-200 mcg/hr, Intravenous, Continuous, Bradler, Clent Jacks, MD, Last Rate: 7.5 mL/hr at 06/12/23 0535, 75 mcg/hr at 06/12/23 0535   folic acid injection 1 mg, 1 mg, Intravenous, Daily, Ezequiel Essex, NP, 1 mg at 06/11/23 4098   free water 30 mL, 30 mL, Per Tube, Q4H, Dgayli, Khabib, MD, 30 mL at 06/12/23 0826   heparin ADULT infusion 100 units/mL (25000 units/256mL), 1,400 Units/hr, Intravenous, Continuous, Belue, Lendon Collar, RPH, Last Rate: 14 mL/hr at 06/12/23 0616, 1,400 Units/hr at 06/12/23 0616   insulin aspart (novoLOG) injection 0-9 Units, 0-9 Units, Subcutaneous, Q4H, Ezequiel Essex, NP, 1 Units at 06/11/23 0826   ipratropium-albuterol (DUONEB) 0.5-2.5 (3) MG/3ML nebulizer solution 3 mL,  3 mL, Nebulization, Q6H PRN, Ezequiel Essex, NP   lactulose (CHRONULAC) 10 GM/15ML solution 30 g, 30 g, Per Tube, TID, [name], MD, 30 g at 06/12/23 0925   levETIRAcetam (KEPPRA) IVPB 1000 mg/100 mL premix, 1,000 mg, Intravenous, Q12H, Dgayli, Lianne Bushy, MD, Stopped at 06/11/23 2327   metroNIDAZOLE (FLAGYL) IVPB 500 mg, 500 mg, Intravenous, Q12H, Ezequiel Essex, NP, Last Rate: 100 mL/hr at 06/12/23 0535, Infusion Verify at 06/12/23 0535   multivitamin  liquid 15 mL, 15 mL, Per Tube, Daily, Ezequiel Essex, NP, 15 mL at 06/12/23 0925   norepinephrine (LEVOPHED) 16 mg in (0.064 mg/mL) premix infusion, 0-40 mcg/min, Intravenous, Continuous, Ouma, Hubbard Hartshorn, NP, Last Rate: 3.75 mL/hr at 06/12/23 0535, 4 mcg/min at 06/12/23 0535   Oral care mouth rinse, 15 mL, Mouth Rinse, Q2H, Ezequiel Essex, NP, 15 mL at 06/12/23 1610   Oral care mouth rinse, 15 mL, Mouth Rinse, PRN, Ezequiel Essex, NP   pantoprazole (PROTONIX) injection 40 mg, 40 mg, Intravenous, Q24H, Ezequiel Essex, NP, 40 mg at 06/11/23 1519   polyethylene glycol (MIRALAX / GLYCOLAX) packet 17 g, 17 g, Oral, Daily PRN, Ezequiel Essex, NP   potassium chloride 10 mEq in 100 mL IVPB, 10 mEq, Intravenous, Q1 Hr x 6, Effie Shy, [facility], Last Rate: 100 mL/hr at 06/12/23 0919, 10 mEq at 06/12/23 0919   propofol (DIPRIVAN) 1000 MG/100ML infusion, 5-80 mcg/kg/min, Intravenous, Titrated, Ouma, Hubbard Hartshorn, NP, Last Rate: 21 mL/hr at 06/12/23 0825, 45 mcg/kg/min at 06/12/23 0825   thiamine (VITAMIN B1) 500 mg in sodium chloride 0.9 % 50 mL IVPB, 500 mg, Intravenous, Daily, Last Rate: 110 mL/hr at 06/12/23 0928, 500 mg at 06/12/23 0928 **FOLLOWED BY** [START ON 06/13/2023] thiamine (VITAMIN B1) injection 100 mg, 100 mg, Intravenous, Daily, Ezequiel Essex, NP  Labs and Diagnostic Imaging   CBC:  Recent Labs  Lab 06/11/23 0351 06/12/23 0400  WBC 19.0* 15.6*  HGB 13.6 13.2  HCT 41.6 40.4  MCV 98.1 98.3  PLT 220 179    Basic Metabolic Panel:  Lab Results  Component Value Date   NA 140 06/12/2023   K 3.1 (L) 06/12/2023   CO2 27 06/12/2023   GLUCOSE 135 (H) 06/12/2023   BUN 17 06/12/2023   CREATININE 1.59 (H) 06/12/2023   CALCIUM 8.4 (L) 06/12/2023   GFRNONAA 43 (L) 06/12/2023   GFRAA >60 12/10/2019   Lipid Panel:  Lab Results  Component Value Date   LDLCALC 85 02/06/2023   HgbA1c:  Lab Results  Component Value Date   HGBA1C 5.3 06/10/2023   Urine Drug Screen:      Component Value Date/Time   LABOPIA POSITIVE (A) 06/10/2023 1129   COCAINSCRNUR NONE DETECTED 06/10/2023 1129   LABBENZ POSITIVE (A) 06/10/2023 1129   AMPHETMU NONE DETECTED 06/10/2023 1129   THCU POSITIVE (A) 06/10/2023 1129   LABBARB NONE DETECTED 06/10/2023 1129    Alcohol Level     Component Value Date/Time   ETH <10 06/10/2023 1447   INR  Lab Results  Component Value Date   INR 1.4 (H) 06/10/2023   APTT  Lab Results  Component Value Date   APTT 37 (H) 06/10/2023    Imaging personally reviewed CT Head without contrast No acute changes  MRI brain without contrast: Extensive abnormal cortical restricted diffusion worst at the occipital lobe and perirolandic gyri-consistent with anoxic brain injury.  Rapid continuous EEG ([facility]): -P   Assessment   Mende Biswell Corman  is a 38 y.o. female past medical history of alcohol abuse complicated by withdrawal seizures who presented after V-fib arrest after witnessed seizure secondary to alcohol withdrawal.  Appeared to be in status myoclonus, started on Versed drip which was eventually changed to propofol. Yesterday had some florid myoclonic jerking off of sedation but today seems to have less of that. MRI of the brain is concerning for severe anoxic brain injury. Awaiting overnight EEG report.  Impression: Anoxic brain injury status postcardiac arrest  Recommendations  Use propofol as needed but try to reduce sedation as tolerated Routine EEG-rapid EEG overnight read pending.  From a prognostication standpoint, with a poor exam as documented above as well as severe anoxic changes on the MRI, as well as poor cardiac status-chances of a neurological meaningful recovery are grim did not. I would recommend and encourage goals of care conversation towards comfort measures.   Plan discussed with Dr. Aundria Rud ______________________________________________________________________   Signed, Milon Dikes, MD [facility]  CRITICAL CARE ATTESTATION Performed by: Milon Dikes, MD Total critical care time: 30 minutes Critical care time was exclusive of separately billable procedures and treating other patients and/or supervising APPs/Residents/Students Critical care was necessary to treat or prevent imminent or life-threatening deterioration. This patient is critically ill and at significant risk for neurological worsening and/or death and care requires constant monitoring. Critical care was time spent personally by me on the following activities: development of treatment plan with patient and/or surrogate as well as nursing, discussions with consultants, evaluation of patient's response to treatment, examination of patient, obtaining history from patient or surrogate, ordering and performing treatments and interventions, ordering and review of laboratory studies, ordering and review of radiographic studies, pulse oximetry, re-evaluation of patient's condition, participation in multidisciplinary rounds and medical decision making of high complexity in the care of this patient.

## 2023-06-12 NOTE — Progress Notes (Signed)
 PHARMACY - ANTICOAGULATION CONSULT NOTE  Pharmacy Consult for heparin infusion Indication: chest pain/ACS  Allergies  Allergen Reactions   Penicillins Swelling    Tolerated ceftriaxone and cefepime in past per [facility] and [facility] records   Patient Measurements: Height: 5\' 3"  (160 cm) Weight: 76.1 kg (167 lb 12.3 oz) IBW/kg (Calculated) : 52.4 Heparin Dosing Weight: 69 kg  Vital Signs: Temp: 98.1 F (36.7 C) (03/03 2345) Temp Source: Esophageal (03/04 0000) BP: 84/66 (03/04 0000) Pulse Rate: 107 (03/04 0000)  Labs: Recent Labs    06/10/23 1129 06/10/23 1344 06/10/23 1600 06/10/23 1831 06/10/23 2248 06/11/23 0351 06/11/23 0558 06/11/23 1506 06/11/23 1807 06/11/23 2246  HGB 15.2*  --   --   --   --  13.6  --   --   --   --   HCT 46.7*  --   --   --   --  41.6  --   --   --   --   PLT 236  --   --   --   --  220  --   --   --   --   APTT  --   --  37*  --   --   --   --   --   --   --   LABPROT  --   --  17.1*  --   --   --   --   --   --   --   INR  --   --  1.4*  --   --   --   --   --   --   --   HEPARINUNFRC  --   --   --   --    < >  --  0.27* 0.26*  --  0.35  CREATININE 1.62*  --   --  1.76*  --  1.78*  --   --  1.60*  --   CKTOTAL  --  481*  --   --   --   --   --   --   --   --   TROPONINIHS 968* 11,582*  --  >24,000*  --   --   --   --   --   --    < > = values in this interval not displayed.    Estimated Creatinine Clearance: 47 mL/min (A) (by C-G formula based on SCr of 1.6 mg/dL (H)).  Medical History: Past Medical History:  Diagnosis Date   Alcohol abuse    Anxiety    Biliary calculi 12/07/2009   Cholelithiasis    Depression    Diet-controlled diabetes mellitus (HCC)    GERD (gastroesophageal reflux disease)    Hepatitis C    body "self healed" per patient   LVH (left ventricular hypertrophy)    a. 10/2022 Echo: EF 60-65%, no rwma, sev conc LVH, nl RV size/fxn, triv MR. Mobile density near ant MV leaflet- felt to be redundant chordae tendinae..    Morbid obesity (HCC)    Palpitations    a. 07/2019 Holter: Sinus rhythm, PVCs, rare PACs.  No significant arrhythmias.   Pancreatic pseudocyst    a. 02/2023 MRI abd: improving unilocular pancreatic pseudocyst.   Retained intrauterine contraceptive device (IUD) 01/20/2020   Substance abuse (HCC)    Medications:  Scheduled:   Chlorhexidine Gluconate Cloth  6 each Topical Q0600   feeding supplement (PROSource TF20)  60 mL Per Tube BID  fentaNYL (SUBLIMAZE) injection  50 mcg Intravenous Once   folic acid  1 mg Intravenous Daily   free water  30 mL Per Tube Q4H   insulin aspart  0-9 Units Subcutaneous Q4H   lactulose  30 g Per Tube TID   multivitamin  15 mL Per Tube Daily   mouth rinse  15 mL Mouth Rinse Q2H   pantoprazole (PROTONIX) IV  40 mg Intravenous Q24H   [START ON 06/23/2023] thiamine (VITAMIN B1) injection  100 mg Intravenous Daily   Assessment: 38 yo female with a PMH of ETOH abuse, seizure activity secondary to ETOH withdrawal, and alcoholic liver cirrhosis. She is on no chronic anticoagulation PTA based on dispens history. Pharmacy was consulted to dose this patient's heparin.   Baseline Labs:  Hgb15.2, PLT wnl, INR/aPTT pending  Goal of Therapy:  Heparin level 0.3-0.7 units/ml Monitor platelets by anticoagulation protocol: Yes  Heparin Levels Date/Time  HL  Clinical Assessment 3/3@0558    0.27  SUBtherapeutic 3/3@1506    0.26  SUBtherapeutic 3/3 2246  0.35  Therapeutic x 1    Plan:  -- Continue heparin drip rate at 1250 units/hr.  -- Recheck HL w/ AM labs to confirm  -- Planned to stop heparin at 1600 on 3/4 (48 hour stop time per cardiology) -- Continue to follow CBC and platelets with AM labs   Otelia Sergeant, PharmD, [facility] 06/12/2023 12:17 AM

## 2023-06-12 NOTE — Progress Notes (Signed)
 [facility] EEG noted to be running low on battery. Removed device to re-charge. After re-charging, device reconnected to patient. After some time, noted a system error message on screen. Called help line to troubleshoot and findings indicated potential connectivity issues which may prevent livestreaming capabilities. Webb Silversmith, NP, made aware.  Carmel Sacramento, RN

## 2023-06-12 NOTE — Procedures (Signed)
 Patient Name: Katie Stanley  MRN: [id number]  Epilepsy Attending: Charlsie Quest  Referring Physician/Provider: Dr Milon Dikes  Duration: 06/11/2023 1552 to 06/12/2023  1209, 1017 to 1040   Patient history: 37yo F s/p cardiac arrest. Rapid EEG to evaluate for seizure   Level of alertness:  comatose   AEDs during EEG study: Versed, LEV   Technical aspects: This EEG was obtained using a 10 lead EEG system positioned circumferentially without any parasagittal coverage (rapid EEG). Computer selected EEG is reviewed as  well as background features and all clinically significant events.   Description:  EEG showed continuous polymorphic mixed frequencies with predominantly 5 to 7 Hz theta slowing admixed with intermittent generalized 2 to 3 Hz delta slowing. Hyperventilation and photic stimulation were not performed.      ABNORMALITY - Continuous slow, generalized   IMPRESSION: This study is suggestive of moderate to severe diffuse encephalopathy. No seizures or definite epileptiform discharges were noted during the study.   Katie Stanley Annabelle Harman

## 2023-06-12 NOTE — Progress Notes (Signed)
 Rounding Note    Patient Name: Katie Stanley Date of Encounter: 06/12/2023  [facility] Cardiologist: Julien Nordmann, MD   Subjective   Patient remains intubated and sedated. Does not respond to commands. No sign of arrhythmia on telemetry.   Inpatient Medications    Scheduled Meds:  Chlorhexidine Gluconate Cloth  6 each Topical Q0600   feeding supplement (PROSource TF20)  60 mL Per Tube BID   fentaNYL (SUBLIMAZE) injection  50 mcg Intravenous Once   folic acid  1 mg Intravenous Daily   free water  30 mL Per Tube Q4H   insulin aspart  0-9 Units Subcutaneous Q4H   lactulose  30 g Per Tube TID   multivitamin  15 mL Per Tube Daily   mouth rinse  15 mL Mouth Rinse Q2H   pantoprazole (PROTONIX) IV  40 mg Intravenous Q24H   potassium chloride  40 mEq Oral Once   [START ON 06/15/2023] thiamine (VITAMIN B1) injection  100 mg Intravenous Daily   Continuous Infusions:  cefTAZidime (FORTAZ)  IV Stopped (06/11/23 2227)   feeding supplement (VITAL 1.5 CAL) 35 mL/hr at 06/12/23 0535   fentaNYL infusion INTRAVENOUS 75 mcg/hr (06/12/23 0535)   heparin 1,400 Units/hr (06/12/23 0616)   levETIRAcetam Stopped (06/11/23 2327)   metronidazole 100 mL/hr at 06/12/23 0535   norepinephrine (LEVOPHED) Adult infusion 4 mcg/min (06/12/23 0535)   potassium chloride 10 mEq (06/12/23 0812)   propofol (DIPRIVAN) infusion 45 mcg/kg/min (06/12/23 0825)   thiamine (VITAMIN B1) injection Stopped (06/11/23 0959)   PRN Meds: acetaminophen **OR** acetaminophen (TYLENOL) oral liquid 160 mg/5 mL **OR** acetaminophen, docusate sodium, fentaNYL, ipratropium-albuterol, mouth rinse, polyethylene glycol   Vital Signs    Vitals:   06/12/23 0615 06/12/23 0630 06/12/23 0645 06/12/23 0700  BP: 101/62 101/70 102/68 98/69  Pulse: (!) 125 (!) 122 (!) 125 (!) 124  Resp: 18 18 18 18   Temp: 99.1 F (37.3 C) 99.3 F (37.4 C) 99.3 F (37.4 C) 99.3 F (37.4 C)  TempSrc:      SpO2: 96% 97% 96% 94%  Weight:       Height:        Intake/Output Summary (Last 24 hours) at 06/12/2023 0902 Last data filed at 06/12/2023 0535 Gross per 24 hour  Intake 2184.32 ml  Output 515 ml  Net 1669.32 ml      06/12/2023    3:10 AM 06/11/2023    3:15 AM 06/10/2023    3:21 PM  Last 3 Weights  Weight (lbs) 156 lb 12 oz 167 lb 12.3 oz 171 lb 1.2 oz  Weight (kg) 71.1 kg 76.1 kg 77.6 kg      Telemetry    Sinus tachycardia with rate 100-130 bpm - Personally Reviewed  Physical Exam   GEN: Patient is intubated and sedated. No acute distress.  Neck: No JVD Cardiac: tachycardic, normal rhythm, no murmurs, rubs, or gallops.  Respiratory: Clear to auscultation anteriorly. Remains intubated.  GI: Soft, nontender, non-distended  MS: No edema; No deformity.  Labs    High Sensitivity Troponin:   Recent Labs  Lab 06/10/23 1129 06/10/23 1344 06/10/23 1831  TROPONINIHS 968* 11,582* >24,000*     Chemistry Recent Labs  Lab 06/10/23 1156 06/10/23 1447 06/11/23 0351 06/11/23 1558 06/11/23 1807 06/12/23 0400  NA  --    < > 137  --  140 140  K  --    < > 3.0*  --  3.0* 3.1*  CL  --    < >  96*  --  97* 100  CO2  --    < > 23  --  27 27  GLUCOSE  --    < > 146*  --  94 135*  BUN  --    < > 13  --  15 17  CREATININE  --    < > 1.78*  --  1.60* 1.59*  CALCIUM  --    < > 8.5*  --  8.8* 8.4*  MG  --    < > 2.2 2.0  --  1.8  PROT 6.9  --   --   --   --  6.3*  ALBUMIN 2.7*  --   --   --   --  2.2*  AST 226*  --   --   --   --  132*  ALT 38  --   --   --   --  32  ALKPHOS 263*  --   --   --   --  183*  BILITOT 3.4*  --   --   --   --  3.2*  GFRNONAA  --    < > 37*  --  42* 43*  ANIONGAP  --    < > 18*  --  16* 13   < > = values in this interval not displayed.    Lipids  Recent Labs  Lab 06/11/23 0351  TRIG 75    Hematology Recent Labs  Lab 06/10/23 1129 06/11/23 0351 06/12/23 0400  WBC 17.3* 19.0* 15.6*  RBC 4.75 4.24 4.11  HGB 15.2* 13.6 13.2  HCT 46.7* 41.6 40.4  MCV 98.3 98.1 98.3  MCH 32.0  32.1 32.1  MCHC 32.5 32.7 32.7  RDW 16.9* 17.1* 18.0*  PLT 236 220 179   Thyroid No results for input(s): "TSH", "FREET4" in the last 168 hours.  BNP Recent Labs  Lab 06/10/23 1129  BNP 114.1*    DDimer No results for input(s): "DDIMER" in the last 168 hours.   Radiology    DG Chest Port 1 View Result Date: 06/12/2023 Electronically Signed   By: Kennith Center M.D.   On: 06/12/2023 07:28   MR BRAIN WO CONTRAST Result Date: 06/11/2023 IMPRESSION: 1. Multifocal abnormal cortical diffusion restriction, worst in the occipital lobes and the perirolandic gyri. The pattern is consistent with anoxic brain injury. 2. Small right parietal scalp hematoma. Electronically Signed   By: Deatra Robinson M.D.   On: 06/11/2023 17:16   EEG adult Result Date: 06/11/2023 IMPRESSION: This study showed evidence of epileptogenicity arising from bilateral posterior quadrant. Due to the frequency of 2.5 to 3 Hz, this EEG pattern is highly suspicious for ictal nature. Additionally there was severe diffuse encephalopathy.  Dr. Wilford Corner was notified. Katie Stanley   Rapid EEG Result Date: 06/11/2023 IMPRESSION: This study initially showed evidence of epileptogenicity with generalized onset.  Given the duration and morphology of epileptiform bursts, this pattern was most likely ictal in nature.  ICU team was notified and medications were adjusted.  Gradually after around 2330 on 06/10/2023, EEG improved and was suggestive of  severe diffuse encephalopathy, likely related to sedation. Please note that this is a limited montage ceribell EEG without video.  Therefore if concern for ictal-interictal activity persist, consider conventional EEG with video for further evaluation. Katie Stanley   DG Chest 1 View Result Date: 06/10/2023 IMPRESSION: Left internal jugular central venous catheter tip overlies the superior vena cava. No pneumothorax. Electronically Signed  By: Romona Curls M.D.   On: 06/10/2023 17:02   CT ABDOMEN  PELVIS WO CONTRAST Result Date: 06/10/2023 IMPRESSION: 1. Left lower lobe collapse. 2. Diffuse hepatic steatosis. Nodularity of the left hepatic lobe suspicious for cirrhosis. Electronically Signed   By: Acquanetta Belling M.D.   On: 06/10/2023 15:38   CT HEAD WO CONTRAST ( ) Result Date: 06/10/2023 IMPRESSION: No acute intracranial finding.  No visible anoxic injury. Negative cervical spine CT. Electronically Signed   By: Tiburcio Pea M.D.   On: 06/10/2023 12:47   CT Cervical Spine Wo Contrast Result Date: 06/10/2023 IMPRESSION: No acute intracranial finding.  No visible anoxic injury. Negative cervical spine CT. Electronically Signed   By: Tiburcio Pea M.D.   On: 06/10/2023 12:47   DG Chest Port 1 View Result Date: 06/10/2023 IMPRESSION: 1. Endotracheal tube is positioned with tip in the midtrachea. 2. Esophagogastric tube with tip and side port below the diaphragm. 3. Evaluation of the heart and lungs very limited by overlying compression board; no obvious abnormality. Electronically Signed   By: Jearld Lesch M.D.   On: 06/10/2023 12:10    Cardiac Studies   04/12/2023 Echo complete 1. Left ventricular ejection fraction, by estimation, is 55 to 60%. The  left ventricle has normal function. The left ventricle has no regional  wall motion abnormalities. There is mild left ventricular hypertrophy.  Indeterminate diastolic filling due to  E-A fusion.   2. Right ventricular systolic function is normal. The right ventricular  size is normal. Tricuspid regurgitation signal is inadequate for assessing  PA pressure.   3. The mitral valve is normal in structure. No evidence of mitral valve  regurgitation. No evidence of mitral stenosis.   4. The aortic valve has an indeterminant number of cusps. Aortic valve  regurgitation is not visualized. No aortic stenosis is present.   Patient Profile     Phylicia Mcgaugh Witts is a 38 y.o. 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   Assessment & Plan    Ventricular fibrillation arrest in the setting of severe seizures due to alcohol withdrawal - Presented 3/2 after witnessed seizure like activities with EMS reporting vfib requiring defibrillation with epi x 4, amiodarone, and 2 amps of bicarb before ROSC - Continue supportive care - Recommend K > 4 and mag >2 - Telemetry without arrhythmia - No indication for antiarrhythmic medications at this time - Echo done, results pending  NSTEMI - Echo 04/2023 with normal LVEF, repeat echo results pending - Troponin > 96045 on admission - Demand ischemia in the setting of cardiac arrest and ventricular fibrillation vs primary cardiac event - Continue IV heparin for 48 hours and ASA - Can consider cardiac cath if patient makes meaningful neurologic recovery  Status myoclonus Concern for anoxic injury - Neurology following - Overall poor prognosis  For questions or updates, please contact [facility] Please consult [email or web address] for contact info under        Signed, Orion Crook, PA-C  06/12/2023, 9:02 AM

## 2023-06-13 ENCOUNTER — Inpatient Hospital Stay

## 2023-06-13 ENCOUNTER — Inpatient Hospital Stay (HOSPITAL_COMMUNITY)
Admit: 2023-06-13 | Discharge: 2023-06-13 | Disposition: A | Discharge status: Home / Self Care | Attending: Critical Care Medicine | Admitting: Critical Care Medicine

## 2023-06-13 DIAGNOSIS — E119 Type 2 diabetes mellitus without complications: Secondary | ICD-10-CM | POA: Diagnosis not present

## 2023-06-13 DIAGNOSIS — F10932 Alcohol use, unspecified with withdrawal with perceptual disturbance: Secondary | ICD-10-CM | POA: Diagnosis not present

## 2023-06-13 DIAGNOSIS — E872 Acidosis, unspecified: Secondary | ICD-10-CM | POA: Diagnosis not present

## 2023-06-13 DIAGNOSIS — Z7189 Other specified counseling: Secondary | ICD-10-CM

## 2023-06-13 DIAGNOSIS — A419 Sepsis, unspecified organism: Secondary | ICD-10-CM | POA: Diagnosis not present

## 2023-06-13 DIAGNOSIS — I469 Cardiac arrest, cause unspecified: Secondary | ICD-10-CM | POA: Diagnosis not present

## 2023-06-13 DIAGNOSIS — Z529 Donor of unspecified organ or tissue: Secondary | ICD-10-CM | POA: Diagnosis not present

## 2023-06-13 DIAGNOSIS — J9601 Acute respiratory failure with hypoxia: Secondary | ICD-10-CM | POA: Diagnosis not present

## 2023-06-13 DIAGNOSIS — N179 Acute kidney failure, unspecified: Secondary | ICD-10-CM | POA: Diagnosis not present

## 2023-06-13 LAB — GLUCOSE, CAPILLARY
Glucose-Capillary: 118 mg/dL — ABNORMAL HIGH (ref 70–99)
Glucose-Capillary: 118 mg/dL — ABNORMAL HIGH (ref 70–99)
Glucose-Capillary: 119 mg/dL — ABNORMAL HIGH (ref 70–99)
Glucose-Capillary: 125 mg/dL — ABNORMAL HIGH (ref 70–99)
Glucose-Capillary: 136 mg/dL — ABNORMAL HIGH (ref 70–99)

## 2023-06-13 LAB — COMPREHENSIVE METABOLIC PANEL
ALT: 26 U/L (ref 0–44)
ALT: 31 U/L (ref 0–44)
ALT: 25 U/L (ref 0–44)
AST: 137 U/L — ABNORMAL HIGH (ref 15–41)
AST: 171 U/L — ABNORMAL HIGH (ref 15–41)
AST: 140 U/L — ABNORMAL HIGH (ref 15–41)
Albumin: 2.4 g/dL — ABNORMAL LOW (ref 3.5–5.0)
Albumin: 2.6 g/dL — ABNORMAL LOW (ref 3.5–5.0)
Albumin: 2.5 g/dL — ABNORMAL LOW (ref 3.5–5.0)
Alkaline Phosphatase: 167 U/L — ABNORMAL HIGH (ref 38–126)
Alkaline Phosphatase: 185 U/L — ABNORMAL HIGH (ref 38–126)
Alkaline Phosphatase: 156 U/L — ABNORMAL HIGH (ref 38–126)
Anion gap: 11 (ref 5–15)
Anion gap: 11 (ref 5–15)
Anion gap: 10 (ref 5–15)
BUN: 22 mg/dL — ABNORMAL HIGH (ref 6–20)
BUN: 24 mg/dL — ABNORMAL HIGH (ref 6–20)
BUN: 26 mg/dL — ABNORMAL HIGH (ref 6–20)
CO2: 26 mmol/L (ref 22–32)
CO2: 24 mmol/L (ref 22–32)
CO2: 25 mmol/L (ref 22–32)
Calcium: 8.6 mg/dL — ABNORMAL LOW (ref 8.9–10.3)
Calcium: 8.5 mg/dL — ABNORMAL LOW (ref 8.9–10.3)
Calcium: 8.2 mg/dL — ABNORMAL LOW (ref 8.9–10.3)
Chloride: 102 mmol/L (ref 98–111)
Chloride: 105 mmol/L (ref 98–111)
Chloride: 105 mmol/L (ref 98–111)
Creatinine, Ser: 1.51 mg/dL — ABNORMAL HIGH (ref 0.44–1.00)
Creatinine, Ser: 1.43 mg/dL — ABNORMAL HIGH (ref 0.44–1.00)
Creatinine, Ser: 1.39 mg/dL — ABNORMAL HIGH (ref 0.44–1.00)
GFR, Estimated: 45 mL/min — ABNORMAL LOW (ref >60)
GFR, Estimated: 48 mL/min — ABNORMAL LOW (ref >60)
GFR, Estimated: 50 mL/min — ABNORMAL LOW (ref >60)
Glucose, Bld: 140 mg/dL — ABNORMAL HIGH (ref 70–99)
Glucose, Bld: 111 mg/dL — ABNORMAL HIGH (ref 70–99)
Glucose, Bld: 125 mg/dL — ABNORMAL HIGH (ref 70–99)
Potassium: 3.8 mmol/L (ref 3.5–5.1)
Potassium: 4.3 mmol/L (ref 3.5–5.1)
Potassium: 4.3 mmol/L (ref 3.5–5.1)
Sodium: 139 mmol/L (ref 135–145)
Sodium: 140 mmol/L (ref 135–145)
Sodium: 140 mmol/L (ref 135–145)
Total Bilirubin: 2.8 mg/dL — ABNORMAL HIGH (ref 0.0–1.2)
Total Bilirubin: 2.6 mg/dL — ABNORMAL HIGH (ref 0.0–1.2)
Total Bilirubin: 2.5 mg/dL — ABNORMAL HIGH (ref 0.0–1.2)
Total Protein: 6.1 g/dL — ABNORMAL LOW (ref 6.5–8.1)
Total Protein: 6.6 g/dL (ref 6.5–8.1)
Total Protein: 5.9 g/dL — ABNORMAL LOW (ref 6.5–8.1)

## 2023-06-13 LAB — BLOOD GAS, ARTERIAL
Acid-Base Excess: 5.2 mmol/L — ABNORMAL HIGH (ref 0.0–2.0)
Bicarbonate: 29 mmol/L — ABNORMAL HIGH (ref 20.0–28.0)
FIO2: 100 %
MECHVT: 500 mL
Mechanical Rate: 18
O2 Saturation: 100 %
PEEP: 5 cmH2O
Patient temperature: 37
pCO2 arterial: 39 mmHg (ref 32–48)
pH, Arterial: 7.48 — ABNORMAL HIGH (ref 7.35–7.45)
pO2, Arterial: 294 mmHg — ABNORMAL HIGH (ref 83–108)

## 2023-06-13 LAB — AMYLASE: Amylase: 26 U/L — ABNORMAL LOW (ref 28–100)

## 2023-06-13 LAB — AMMONIA: Ammonia: 42 umol/L — ABNORMAL HIGH (ref 9–35)

## 2023-06-13 LAB — MRSA NEXT GEN BY PCR, NASAL: MRSA by PCR Next Gen: NOT DETECTED

## 2023-06-13 LAB — APTT
aPTT: 27 s (ref 24–36)
aPTT: 33 s (ref 24–36)

## 2023-06-13 LAB — CBC
HCT: 36.1 % (ref 36.0–46.0)
HCT: 36.5 % (ref 36.0–46.0)
HCT: 31.5 % — ABNORMAL LOW (ref 36.0–46.0)
Hemoglobin: 11.5 g/dL — ABNORMAL LOW (ref 12.0–15.0)
Hemoglobin: 11.9 g/dL — ABNORMAL LOW (ref 12.0–15.0)
Hemoglobin: 10.3 g/dL — ABNORMAL LOW (ref 12.0–15.0)
MCH: 31.8 pg (ref 26.0–34.0)
MCH: 31.8 pg (ref 26.0–34.0)
MCH: 32.5 pg (ref 26.0–34.0)
MCHC: 31.9 g/dL (ref 30.0–36.0)
MCHC: 32.6 g/dL (ref 30.0–36.0)
MCHC: 32.7 g/dL (ref 30.0–36.0)
MCV: 99.7 fL (ref 80.0–100.0)
MCV: 97.6 fL (ref 80.0–100.0)
MCV: 99.4 fL (ref 80.0–100.0)
Platelets: 135 10*3/uL — ABNORMAL LOW (ref 150–400)
Platelets: 144 10*3/uL — ABNORMAL LOW (ref 150–400)
Platelets: 133 10*3/uL — ABNORMAL LOW (ref 150–400)
RBC: 3.62 MIL/uL — ABNORMAL LOW (ref 3.87–5.11)
RBC: 3.74 MIL/uL — ABNORMAL LOW (ref 3.87–5.11)
RBC: 3.17 MIL/uL — ABNORMAL LOW (ref 3.87–5.11)
RDW: 18.6 % — ABNORMAL HIGH (ref 11.5–15.5)
RDW: 18.9 % — ABNORMAL HIGH (ref 11.5–15.5)
RDW: 18.9 % — ABNORMAL HIGH (ref 11.5–15.5)
WBC: 9.7 10*3/uL (ref 4.0–10.5)
WBC: 9.2 10*3/uL (ref 4.0–10.5)
WBC: 9.1 10*3/uL (ref 4.0–10.5)
nRBC: 0.2 % (ref 0.0–0.2)
nRBC: 0.7 % — ABNORMAL HIGH (ref 0.0–0.2)
nRBC: 0.2 % (ref 0.0–0.2)

## 2023-06-13 LAB — PROTIME-INR
INR: 1.2 (ref 0.8–1.2)
INR: 1.3 — ABNORMAL HIGH (ref 0.8–1.2)
Prothrombin Time: 15.6 s — ABNORMAL HIGH (ref 11.4–15.2)
Prothrombin Time: 16.8 s — ABNORMAL HIGH (ref 11.4–15.2)

## 2023-06-13 LAB — TYPE AND SCREEN
ABO/RH(D): A POS
ABO/RH(D): A POS
Antibody Screen: NEGATIVE
Antibody Screen: NEGATIVE
PT AG Type: POSITIVE
PT AG Type: POSITIVE

## 2023-06-13 LAB — TROPONIN I (HIGH SENSITIVITY)
Troponin I (High Sensitivity): 13021 ng/L (ref <18)
Troponin I (High Sensitivity): 14276 ng/L (ref <18)

## 2023-06-13 LAB — CK
Total CK: 175 U/L (ref 38–234)
Total CK: 178 U/L (ref 38–234)

## 2023-06-13 LAB — PHOSPHORUS
Phosphorus: 1.4 mg/dL — ABNORMAL LOW (ref 2.5–4.6)
Phosphorus: 2.2 mg/dL — ABNORMAL LOW (ref 2.5–4.6)
Phosphorus: 3.6 mg/dL (ref 2.5–4.6)

## 2023-06-13 LAB — CULTURE, RESPIRATORY W GRAM STAIN: Gram Stain: NONE SEEN

## 2023-06-13 LAB — LACTIC ACID, PLASMA
Lactic Acid, Venous: 2.7 mmol/L (ref 0.5–1.9)
Lactic Acid, Venous: 1.6 mmol/L (ref 0.5–1.9)

## 2023-06-13 LAB — LIPASE, BLOOD: Lipase: 36 U/L (ref 11–51)

## 2023-06-13 LAB — MAGNESIUM
Magnesium: 1.8 mg/dL (ref 1.7–2.4)
Magnesium: 1.9 mg/dL (ref 1.7–2.4)
Magnesium: 2.4 mg/dL (ref 1.7–2.4)

## 2023-06-13 LAB — BILIRUBIN, DIRECT
Bilirubin, Direct: 1.3 mg/dL — ABNORMAL HIGH (ref 0.0–0.2)
Bilirubin, Direct: 1.3 mg/dL — ABNORMAL HIGH (ref 0.0–0.2)

## 2023-06-13 LAB — PREGNANCY, URINE: Preg Test, Ur: NEGATIVE

## 2023-06-13 LAB — COOXEMETRY PANEL
Carboxyhemoglobin: 1.4 % (ref 0.5–1.5)
Methemoglobin: 0.8 % (ref 0.0–1.5)
O2 Saturation: 99.4 %
Total hemoglobin: 11.7 g/dL — ABNORMAL LOW (ref 12.0–16.0)
Total oxygen content: 97.2 %

## 2023-06-13 MED ORDER — MAGNESIUM SULFATE 2 GM/50ML IV SOLN
2.0000 g | Freq: Once | INTRAVENOUS | Status: AC
  Administered 2023-06-13: 2 g via INTRAVENOUS
  Filled 2023-06-13: qty 50

## 2023-06-13 MED ORDER — NOREPINEPHRINE 4 MG/250ML-% IV SOLN
0.0000 ug/min | INTRAVENOUS | Status: DC
Start: 2023-06-13 — End: 2023-06-14

## 2023-06-13 MED ORDER — FENTANYL 2500MCG IN NS 250ML (10MCG/ML) PREMIX INFUSION
50.0000 ug/h | INTRAVENOUS | Status: DC
  Administered 2023-06-13: 50 ug/h via INTRAVENOUS
  Filled 2023-06-13: qty 250

## 2023-06-13 MED ORDER — DOCUSATE SODIUM 50 MG/5ML PO LIQD
100.0000 mg | Freq: Two times a day (BID) | ORAL | Status: DC
  Administered 2023-06-13: 100 mg
  Filled 2023-06-13 (×2): qty 10

## 2023-06-13 MED ORDER — POLYETHYLENE GLYCOL 3350 17 G PO PACK
17.0000 g | PACK | Freq: Every day | ORAL | Status: DC
  Administered 2023-06-13 – 2023-06-14 (×2): 17 g
  Filled 2023-06-13 (×2): qty 1

## 2023-06-13 MED ORDER — LACTATED RINGERS IV BOLUS
250.0000 mL | Freq: Once | INTRAVENOUS | Status: AC
  Administered 2023-06-13: 250 mL via INTRAVENOUS

## 2023-06-13 MED ORDER — METOPROLOL TARTRATE 25 MG PO TABS
12.5000 mg | ORAL_TABLET | Freq: Four times a day (QID) | ORAL | Status: DC
  Administered 2023-06-13 – 2023-06-14 (×2): 12.5 mg
  Filled 2023-06-13 (×2): qty 1

## 2023-06-13 MED ORDER — FENTANYL CITRATE PF 50 MCG/ML IJ SOSY: 50.0000 ug | PREFILLED_SYRINGE | Freq: Once | INTRAMUSCULAR | Status: DC

## 2023-06-13 MED ORDER — ALBUMIN HUMAN 25 % IV SOLN
25.0000 g | Freq: Once | INTRAVENOUS | Status: AC
  Administered 2023-06-13: 12.5 g via INTRAVENOUS
  Filled 2023-06-13: qty 100

## 2023-06-13 MED ORDER — FENTANYL CITRATE PF 50 MCG/ML IJ SOSY: 50.0000 ug | PREFILLED_SYRINGE | INTRAMUSCULAR | Status: DC | PRN

## 2023-06-13 MED ORDER — NOREPINEPHRINE 4 MG/250ML-% IV SOLN
INTRAVENOUS | Status: AC
  Filled 2023-06-13: qty 250

## 2023-06-13 MED ORDER — FENTANYL BOLUS VIA INFUSION: 50.0000 ug | INTRAVENOUS | Status: DC | PRN

## 2023-06-13 MED ORDER — POTASSIUM PHOSPHATES 15 MMOLE/5ML IV SOLN
30.0000 mmol | Freq: Once | INTRAVENOUS | Status: AC
  Administered 2023-06-13: 30 mmol via INTRAVENOUS
  Filled 2023-06-13: qty 10

## 2023-06-13 MED ORDER — METOPROLOL SUCCINATE ER 25 MG PO TB24: 12.5000 mg | ORAL_TABLET | Freq: Two times a day (BID) | ORAL | Status: DC

## 2023-06-13 NOTE — Progress Notes (Signed)
 NAME:  Katie Stanley, MRN:  [id number], DOB:  07-31-1985, LOS: 3 ADMISSION DATE:  06/10/2023, CONSULTATION DATE:  06/10/23 REFERRING MD:  Dr. Vicente Males, CHIEF COMPLAINT:  Cardiac Arrest   Brief Pt Description / Synopsis:  38 y.o. female with PMHx significant for ETOH abuse, ETOH withdrawal seizures and alcoholic liver cirrhosis admitted following out-of-hospital V. Fib Cardiac Arrest (approximately 25 minutes of ACLS prior to ROSC), NSTEMI, multifactorial Shock, suspected aspiration pneumonia, AKI, and severe myoclonus with concern for possible anoxic brain injury.  History of Present Illness:  This is a 38 yo female with a PMH of ETOH abuse, seizure activity secondary to ETOH withdrawal, and alcoholic liver cirrhosis.  She presented to [facility] ER on 03/2 via EMS from home after pts family witnessed the pt having seizure-like activity (staring spell followed by abnormal respirations then tensing up with abnormal respirations followed by "shaking activity" resulting in tongue trauma) and subsequent loss of consciousness.  Per ER notes EMS reported pt v fib arrested requiring epi x4, amiodarone load, 2 amps of bicarb, and defibrillation x1 prior to ROSC after 23 minutes.  Pts significant other reported to EDP the pt has had heavy alcohol abuse over the past week with 1/2 pint of liquor daily.  She attempted to stop over the last few days and initially had seizure activity on 03/1.  Therefore, pt had an alcoholic beverage following the seizure activity in an attempt to prevent recurrent seizure activity.  However, the pt did not have an alcoholic beverage today.     ED Course  Upon arrival to the ER pt severely hypotensive despite IV fluids resuscitation requiring 1 amp of epi followed by levophed gtt.  Due to inability to protect airway pt mechanically intubated for airway protection.  EKG revealed sinus rhythm, hr 78 without signs of ST elevation.  CXR revealed no acute cardiopulmonary abnormality.  Significant  lab results were: K+ 2.5/chloride 88/CO2 19/glucose 243/creatinine 1.62/ anion gap 32/lactic acid >9.0/wbc 17.3.  CT Head/Cervical Spine negative for acute intracranial abnormality.  Pt received 2 grams of iv keppra.    PCCM asked to admit for further workup and treatment.  Please see "Significant Hospital Events" section below for full detailed hospital course.  Pertinent  Medical History   Past Medical History:  Diagnosis Date   Alcohol abuse    Anxiety    Biliary calculi 12/07/2009   Cholelithiasis    Depression    Diet-controlled diabetes mellitus (HCC)    GERD (gastroesophageal reflux disease)    Hepatitis C    body "self healed" per patient   LVH (left ventricular hypertrophy)    a. 10/2022 Echo: EF 60-65%, no rwma, sev conc LVH, nl RV size/fxn, triv MR. Mobile density near ant MV leaflet- felt to be redundant chordae tendinae..   Morbid obesity (HCC)    Palpitations    a. 07/2019 Holter: Sinus rhythm, PVCs, rare PACs.  No significant arrhythmias.   Pancreatic pseudocyst    a. 02/2023 MRI abd: improving unilocular pancreatic pseudocyst.   Retained intrauterine contraceptive device (IUD) 01/20/2020   Substance abuse (HCC)     Micro Data:  03/2: Resp panel by RT>>negative  03/2: Blood x2>> no growth to date 03/2: MRSA PCR>> negative 03/2: Tracheal aspirate>>normal resp flora-no staph or pseudomonas seen  03/2: Urine culture>> no growth  Antimicrobials:   Anti-infectives (From admission, onward)    Start     Dose/Rate Route Frequency Ordered Stop   06/12/23 2200  ceFEPIme (MAXIPIME) 2 g in  sodium chloride 0.9 % 100 mL IVPB        2 g 200 mL/hr over 30 Minutes Intravenous Every 12 hours 06/12/23 1151     06/11/23 1200  vancomycin (VANCOREADY) IVPB 750 mg/150 mL  Status:  Discontinued        750 mg 150 mL/hr over 60 Minutes Intravenous Every 24 hours 06/10/23 1512 06/11/23 1036   06/10/23 1800  metroNIDAZOLE (FLAGYL) IVPB 500 mg  Status:  Discontinued        500  mg 100 mL/hr over 60 Minutes Intravenous Every 12 hours 06/10/23 1506 06/12/23 1037   06/10/23 1700  vancomycin (VANCOREADY) IVPB 1500 mg/300 mL        1,500 mg 150 mL/hr over 120 Minutes Intravenous  Once 06/10/23 1507 06/10/23 2331   06/10/23 1600  cefTAZidime (FORTAZ) 2 g in sodium chloride 0.9 % 100 mL IVPB  Status:  Discontinued        2 g 200 mL/hr over 30 Minutes Intravenous Every 12 hours 06/10/23 1506 06/12/23 1151       Significant Hospital Events: Including procedures, antibiotic start and stop dates in addition to other pertinent events   03/2: Admitted to ICU with hypotension (+/- septic and/or hypovolemic shock) acute encephalopathy following seizure-activity suspected secondary to ETOH withdrawal requiring mechanical intubation  03/03: Overnight with severe myoclonus requiring increasing Versed gtt to 20 mg/hr, will attempt to transition to Propofol gtt.  Critically ill with multiorgan failure.  Neurology following, plan for repeat EEG and MRI Brain today.  Cardiology consulted for NSTEMI.  MRI concerning for anoxic brain injury. 03/04: No significant events noted overnight.  Myoclonus resolved with addition of propofol. Neuro exam remains poor and unchanged off Propofol.  Repeat EEG  03/05: Pt remains mechanically intubated.  Per neurology chances of meaningful neurological recovery are grim, therefore encouraging goals of care conversations towards comfort measures.  Pts mother to arrive at bedside 1300 will discuss goals of care   Interim History / Subjective:  As outlined above under "Significant Hospital Events" section  Objective   Blood pressure 98/67, pulse (!) 115, temperature 98.6 F (37 C), resp. rate (!) 21, height 5\' 3"  (1.6 m), weight 81.8 kg, SpO2 94%.    Vent Mode: PRVC FiO2 (%):  [28 %] 28 % Set Rate:  [18 bmp] 18 bmp Vt Set:  [500 mL] 500 mL PEEP:  [5 cmH20] 5 cmH20   Intake/Output Summary (Last 24 hours) at 06/13/2023 0950 Last data filed at 06/13/2023  0600 Gross per 24 hour  Intake 2719.32 ml  Output 50 ml  Net 2669.32 ml   Filed Weights   06/11/23 0315 06/12/23 0310 06/13/23 0320  Weight: 76.1 kg 71.1 kg 81.8 kg    Examination: General: Acute on chronically-ill appearing female, NAD mechanically intubated and sedated HENT: Supple, no JVD, orally intubated Lungs: Coarse breath sounds throughout, even, non labored, synchronous with vent Cardiovascular: Tachycardic, s1s2, no m/r/g, 2+ radial/1+ distal pulses, 1+ generalized edema  Abdomen: +BS x4, obese, mildly distended  Extremities: Normal bulk and tone, not moving extremities  Neuro: Unresponsive, not following commands or withdrawing from painful stimulation; no corneal/gag/corneal reflexes; she is breathing over the ventilator, pupils mildly asymmetric but reactive GU: Indwelling foley catheter draining dark yellow urine   Resolved Hospital Problem list     Assessment & Plan:   #Out-of-hospital v. fib cardiac arrest, suspect secondary to electrolyte abnormalities vs seizures #NSTEMI #Multifactorial Shock: Hypovolemic +/- Cardiogenic +/- Septic Echocardiogram 06/12/23:  LVEF 25-30%, mild  LVH, indeterminate diastolic parameters, RV systolic function normal, RV size is normal - Continuous cardiac monitoring - Prn levophed gtt to maintain map 65 or higher  - Cardiology following, appreciate input - Received heparin gtt for 48 hrs per cardiology recommendations   #Acute hypoxic respiratory Failure #Suspected aspiration pneumonia - Full vent support, implement lung protective strategies - Plateau pressures less than 30 cm H20 - Wean FiO2 & PEEP as tolerated to maintain O2 sats >92% - Follow intermittent Chest X-ray & ABG as needed - Spontaneous Breathing Trials when respiratory parameters met and mental status permits - Implement VAP Bundle - Prn Bronchodilators - ABX as above  #Sepsis  #Suspected aspiration pneumonia -Monitor fever curve -Trend WBC's &  Procalcitonin -Follow cultures as above -Continue empiric Ceftazidime and Flagyl (d/c Vancomycin) pending cultures & sensitivities  #Acute kidney injury secondary to ATN  #Hypokalemia~resolved   #Anion gap metabolic acidosis~resolved  #Severe lactic acidosis~improving   - Trend BMP  - Replace electrolytes as indicated ~ Pharmacy following for assistance with electrolyte replacement - Strict I&O's - Ensure adequate renal perfusion - Avoid nephrotoxic agents as able  #Alcoholic cirrhosis  - Trend hepatic function panel and coags - Avoid hepatoxic agents    #Type II diabetes mellitus  - CBG's q4h; Target range of 140 to 180 - SSI - Follow ICU Hypo/Hyperglycemia protocol  #Acute metabolic encephalopathy  #Severe anoxic brain injury  #Mechanical intubation pain/discomfort  Hx: Polysubstance abuse, anxiety, and depression MRI Brain 3/3 concerning for anoxic brain injury - Treatment of metabolic derangements as outlined above - Maintain a RASS goal of 0  - All sedation off and pt remains unresponsive  - Avoid sedating medications as able - Daily wake up assessment - Neurology following, appreciate input - Continue keppra 1g BID  Pt is critically ill with multiorgan failure. Prognosis is guarded, high risk for further decompensation, cardiac arrest, and death.  Prognosis is guarded.  Given current critical illness superimposed on prolonged cardiac arrest pt with severe anoxic brain injury and multiple chronic co-morbidities, overall long term prognosis is poor.  Recommend consideration of DNR/DNI status and transition to comfort measures. Best Practice (right click and "Reselect all SmartList Selections" daily)   Diet/type: NPO, tube feeds DVT prophylaxis: subcutaneous heparin  GI prophylaxis: PPI Lines: Central line and yes and it is still needed Foley:  Yes, and it is still needed Code Status:  full code Last date of multidisciplinary goals of care discussion  [06/13/23]  06/13/23: Pts mother arriving at bedside at 1300 for meeting to discuss goals of care   Labs   CBC: Recent Labs  Lab 06/10/23 1129 06/11/23 0351 06/12/23 0400 06/13/23 0417  WBC 17.3* 19.0* 15.6* 9.7  HGB 15.2* 13.6 13.2 11.5*  HCT 46.7* 41.6 40.4 36.1  MCV 98.3 98.1 98.3 99.7  PLT 236 220 179 135*    Basic Metabolic Panel: Recent Labs  Lab 06/10/23 1447 06/10/23 1831 06/10/23 2139 06/11/23 0351 06/11/23 1558 06/11/23 1807 06/12/23 0400 06/12/23 1701 06/13/23 0417  NA  --    < >  --  137  --  140 140 138 139  K  --    < > 3.3* 3.0*  --  3.0* 3.1* 3.9 3.8  CL  --    < >  --  96*  --  97* 100 101 102  CO2  --    < >  --  23  --  27 27 24 26   GLUCOSE  --    < >  --  146*  --  94 135* 154* 140*  BUN  --    < >  --  13  --  15 17 18  22*  CREATININE  --    < >  --  1.78*  --  1.60* 1.59* 1.47* 1.51*  CALCIUM  --    < >  --  8.5*  --  8.8* 8.4* 8.4* 8.6*  MG 1.5*  --  2.3 2.2 2.0  --  1.8  --  1.8  PHOS 7.3*  --   --  4.6 3.4  --  2.6  --  1.4*   < > = values in this interval not displayed.   GFR: Estimated Creatinine Clearance: 51.7 mL/min (A) (by C-G formula based on SCr of 1.51 mg/dL (H)). Recent Labs  Lab 06/10/23 1129 06/10/23 1306 06/10/23 1447 06/10/23 1730 06/11/23 0351 06/11/23 0810 06/12/23 0400 06/12/23 1042 06/12/23 1231 06/12/23 1701 06/12/23 1952 06/13/23 0417  PROCALCITON  --   --  0.78  --   --   --   --   --   --   --   --   --   WBC 17.3*  --   --   --  19.0*  --  15.6*  --   --   --   --  9.7  LATICACIDVEN >9.0*   < >  --    < >  --    < >  --  2.6* 3.2* 3.5* 3.5*  --    < > = values in this interval not displayed.    Liver Function Tests: Recent Labs  Lab 06/10/23 1156 06/12/23 0400 06/13/23 0417  AST 226* 132* 137*  ALT 38 32 26  ALKPHOS 263* 183* 167*  BILITOT 3.4* 3.2* 2.8*  PROT 6.9 6.3* 6.1*  ALBUMIN 2.7* 2.2* 2.4*   Recent Labs  Lab 06/10/23 1344  LIPASE 69*  AMYLASE 194*   Recent Labs  Lab  06/10/23 1447 06/13/23 0417  AMMONIA 218* 42*    ABG    Component Value Date/Time   PHART 7.52 (H) 06/12/2023 0457   PCO2ART 37 06/12/2023 0457   PO2ART 86 06/12/2023 0457   HCO3 30.2 (H) 06/12/2023 0457   ACIDBASEDEF 7.9 (H) 06/10/2023 1146   O2SAT 86.6 06/12/2023 1650     Coagulation Profile: Recent Labs  Lab 06/10/23 1600  INR 1.4*    Cardiac Enzymes: Recent Labs  Lab 06/10/23 1344  CKTOTAL 481*    HbA1C: HB A1C (BAYER DCA - WAIVED)  Date/Time Value Ref Range Status  02/05/2023 03:03 PM 6.1 (H) 4.8 - 5.6 % Final    Comment:             Prediabetes: 5.7 - 6.4          Diabetes: >6.4          Glycemic control for adults with diabetes: <7.0   10/01/2018 04:30 PM 5.3 <7.0 % Final    Comment:                                          Diabetic Adult            <7.0  Healthy Adult        4.3 - 5.7                                                           (DCCT/NGSP) American Diabetes Association's Summary of Glycemic Recommendations for Adults with Diabetes: Hemoglobin A1c <7.0%. More stringent glycemic goals (A1c <6.0%) may further reduce complications at the cost of increased risk of hypoglycemia.    Hgb A1c MFr Bld  Date/Time Value Ref Range Status  06/10/2023 02:47 PM 5.3 4.8 - 5.6 % Final    Comment:    (NOTE) Pre diabetes:          5.7%-6.4%  Diabetes:              >6.4%  Glycemic control for   <7.0% adults with diabetes   10/27/2022 05:04 PM 6.6 (H) 4.8 - 5.6 % Final    Comment:    (NOTE)         Prediabetes: 5.7 - 6.4         Diabetes: >6.4         Glycemic control for adults with diabetes: <7.0     CBG: Recent Labs  Lab 06/12/23 1650 06/12/23 1922 06/12/23 2315 06/13/23 0314 06/13/23 0733  GLUCAP 128* 112* 116* 118* 136*    Review of Systems:   Unable to assess due to AMS/sedation/intubation/critical illness  Past Medical History:  She,  has a past medical history of Alcohol abuse, Anxiety,  Biliary calculi (12/07/2009), Cholelithiasis, Depression, Diet-controlled diabetes mellitus (HCC), GERD (gastroesophageal reflux disease), Hepatitis C, LVH (left ventricular hypertrophy), Morbid obesity (HCC), Palpitations, Pancreatic pseudocyst, Retained intrauterine contraceptive device (IUD) (01/20/2020), and Substance abuse (HCC).   Surgical History:   Past Surgical History:  Procedure Laterality Date   CHOLECYSTECTOMY     2011   COLONOSCOPY WITH PROPOFOL N/A 09/20/2022   Procedure: COLONOSCOPY WITH PROPOFOL;  Surgeon: Midge Minium, MD;  Location: [facility] ENDOSCOPY;  Service: Endoscopy;  Laterality: N/A;   ESOPHAGOGASTRODUODENOSCOPY N/A 09/29/2021   Procedure: ESOPHAGOGASTRODUODENOSCOPY (EGD);  Surgeon: Midge Minium, MD;  Location: [facility] ENDOSCOPY;  Service: Endoscopy;  Laterality: N/A;   ESOPHAGOGASTRODUODENOSCOPY (EGD) WITH PROPOFOL N/A 01/21/2018   Procedure: ESOPHAGOGASTRODUODENOSCOPY (EGD) WITH Biopsy;  Surgeon: Midge Minium, MD;  Location: [facility];  Service: Endoscopy;  Laterality: N/A;   HYSTEROSCOPY WITH D & C N/A 01/20/2020   Procedure: DILATATION AND CURETTAGE /HYSTEROSCOPY;  Surgeon: Conard Novak, MD;  Location: [facility] ORS;  Service: Gynecology;  Laterality: N/A;   INTRAUTERINE DEVICE (IUD) INSERTION N/A 01/20/2020   Procedure: INTRAUTERINE DEVICE (IUD) INSERTION MIRENA IUD;  Surgeon: Conard Novak, MD;  Location: [facility] ORS;  Service: Gynecology;  Laterality: N/A;   IUD REMOVAL N/A 01/20/2020   Procedure: INTRAUTERINE DEVICE (IUD) REMOVAL;  Surgeon: Conard Novak, MD;  Location: [facility] ORS;  Service: Gynecology;  Laterality: N/A;   PORT-A-CATH REMOVAL  02/08/2022   PORTA CATH INSERTION N/A 01/01/2019   Procedure: PORTA CATH INSERTION;  Surgeon: Annice Needy, MD;  Location: [facility] INVASIVE CV LAB;  Service: Cardiovascular;  Laterality: N/A;   VASCULAR SURGERY       Social History:   reports that she has been smoking cigarettes. She has a 12 pack-year smoking  history. She has never used smokeless tobacco. She reports current alcohol use. She reports  current drug use. Drug: Marijuana.   Family History:  Her family history includes Breast cancer in her paternal grandmother; Diabetes in her father; Heart disease in her paternal grandfather; Hypertension in her father, maternal grandfather, and mother; Liver cancer in her maternal grandmother.   Allergies Allergies  Allergen Reactions   Penicillins Swelling    Tolerated ceftriaxone and cefepime in past per [facility] and [facility] records     Home Medications  Prior to Admission medications   Medication Sig Start Date End Date Taking? Authorizing Provider  acetaminophen (TYLENOL) 500 MG tablet Take 1 tablet (500 mg total) by mouth 3 (three) times daily as needed for headache, fever or moderate pain (pain score 4-6). 04/18/23  Yes Loyce Dys, MD  citalopram (CELEXA) 20 MG tablet TAKE 1.5 TABLET(20 MG) BY MOUTH DAILY 02/20/23  Yes Johnson, Megan P, DO  famotidine (PEPCID) 20 MG tablet Take 20 mg by mouth 2 (two) times daily.   Yes [provider]  fluticasone furoate-vilanterol (BREO ELLIPTA) 200-25 MCG/ACT AEPB Inhale 1 puff into the lungs daily. 04/19/23  Yes Loyce Dys, MD  furosemide (LASIX) 40 MG tablet Take 1 tablet (40 mg total) by mouth 2 (two) times daily. 05/08/23  Yes Johnson, Megan P, DO  gabapentin (NEURONTIN) 300 MG capsule Take 1 capsule (300 mg total) by mouth at bedtime. Increase to twice daily if able to tolerate without any drowsiness 06/05/23  Yes Vanga, Loel Dubonnet, MD  Iron, Ferrous Sulfate, 325 (65 Fe) MG TABS Take 325 mg by mouth daily. 03/07/23  Yes Johnson, Megan P, DO  lactulose (CHRONULAC) 10 GM/15ML solution TAKE 15 MLS BY MOUTH DAILY 05/23/23  Yes Johnson, Megan P, DO  lidocaine (LIDODERM) 5 % Place 1 patch onto the skin daily. Remove & Discard patch within 12 hours or as directed by MD 04/28/22  Yes Johnson, Megan P, DO  MAGNESIUM PO Take 1 each by mouth in the morning and  at bedtime. OTC patient is unsure of the dost   Yes [provider]  methocarbamol (ROBAXIN) 500 MG tablet Take 1 tablet (500 mg total) by mouth 2 (two) times daily. 05/08/23  Yes Johnson, Megan P, DO  metoprolol succinate (TOPROL-XL) 25 MG 24 hr tablet Take 1 tablet (25 mg total) by mouth daily. 02/20/23  Yes Johnson, Megan P, DO  Multiple Vitamin (MULTIVITAMIN WITH MINERALS) TABS tablet Take 1 tablet by mouth daily. 02/13/23  Yes Regalado, Belkys A, MD  pantoprazole (PROTONIX) 40 MG tablet Take 1 tablet (40 mg total) by mouth 2 (two) times daily before a meal. 05/08/23 06/10/23 Yes Johnson, Megan P, DO  potassium chloride SA (KLOR-CON M) 20 MEQ tablet TAKE 1 TABLET(20 MEQ) BY MOUTH DAILY 03/20/23  Yes Johnson, Megan P, DO  folic acid (FOLVITE) 1 MG tablet Take 1 tablet (1 mg total) by mouth daily. Patient not taking: Reported on 06/10/2023 02/13/23   Hartley Barefoot A, MD  spironolactone (ALDACTONE) 50 MG tablet Take 1 tablet (50 mg total) by mouth daily. Patient not taking: Reported on 06/07/2023 05/08/23   Dorcas Carrow, DO     Critical care time: 40 minutes     Zada Girt, [facility]  Pulmonary/Critical Care Pager [phone] (please enter 7 digits) PCCM Consult Pager [phone] (please enter 7 digits)

## 2023-06-13 NOTE — Progress Notes (Signed)
 NEUROLOGY CONSULT FOLLOW UP NOTE   Date of service: June 13, 2023 Patient Name: Katie Stanley MRN:  [id number] DOB:  07/10/1985  Interval Hx/subjective  Fentanyl off yesterday Propofol held for exam No purposeful movements on exam  Vitals   Vitals:   06/13/23 0715 06/13/23 0730 06/13/23 0745 06/13/23 0815  BP: 104/66 98/64 106/72 98/67  Pulse: (!) 114 (!) 114 (!) 116 (!) 115  Resp: 20 20 18  (!) 21  Temp: 98.4 F (36.9 C) 98.4 F (36.9 C) 98.4 F (36.9 C) 98.6 F (37 C)  TempSrc:      SpO2: 97% 96% 100% 94%  Weight:      Height:         Body mass index is 31.95 kg/m.  Physical Exam  General: Sedated on propofol which was held for exam, intubated HEENT: Normocephalic atraumatic Lungs: Vented CVs: Tachycardic Neurological exam Sedation held for exam Intubated No spontaneous movements seen or noted Does not respond to voice Cranial nerves: Mildly asymmetric pupils with left pupil 3 mm round sluggishly reactive, right pupil 2 mm round sluggishly reactive-unchanged from prior.  Absent corneals.  Breathing over the ventilator.  No cough or gag Motor examination: No spontaneous movement, no movement to Dr. [employer] Sensory exam: As above   Medications  Current Facility-Administered Medications:    acetaminophen (TYLENOL) tablet 650 mg, 650 mg, Oral, Q4H PRN **OR** acetaminophen (TYLENOL) 160 MG/5ML solution 650 mg, 650 mg, Per Tube, Q4H PRN **OR** acetaminophen (TYLENOL) suppository 650 mg, 650 mg, Rectal, Q4H PRN, Ezequiel Essex, NP   aspirin chewable tablet 81 mg, 81 mg, Per Tube, Daily, End, Cristal Deer, MD   ceFEPIme (MAXIPIME) 2 g in sodium chloride 0.9 % 100 mL IVPB, 2 g, Intravenous, Q12H, Dgayli, Lianne Bushy, MD, Stopped at 06/12/23 2142   Chlorhexidine Gluconate Cloth 2 % PADS 6 each, 6 each, Topical, Q0600, Ezequiel Essex, NP, 6 each at 06/12/23 0928   docusate (COLACE) 50 MG/5ML liquid 100 mg, 100 mg, Per Tube, BID, Rust-Chester, Micheline Rough L, NP   docusate  sodium (COLACE) capsule 100 mg, 100 mg, Oral, BID PRN, Ezequiel Essex, NP   feeding supplement (PROSource TF20) liquid 60 mL, 60 mL, Per Tube, BID, Dgayli, Lianne Bushy, MD, 60 mL at 06/12/23 2113   feeding supplement (VITAL 1.5 CAL) liquid 1,000 mL, 1,000 mL, Per Tube, Continuous, Dgayli, Lianne Bushy, MD, Last Rate: 45 mL/hr at 06/13/23 0600, Infusion Verify at 06/13/23 0600   fentaNYL (SUBLIMAZE) injection 50 mcg, 50 mcg, Intravenous, Q15 min PRN, Rust-Chester, Micheline Rough L, NP   fentaNYL (SUBLIMAZE) injection 50-200 mcg, 50-200 mcg, Intravenous, Q30 min PRN, Rust-Chester, Micheline Rough L, NP   folic acid injection 1 mg, 1 mg, Intravenous, Daily, Ezequiel Essex, NP, 1 mg at 06/12/23 1053   free water 30 mL, 30 mL, Per Tube, Q4H, Dgayli, Khabib, MD, 30 mL at 06/13/23 0828   heparin injection 5,000 Units, 5,000 Units, Subcutaneous, Q8H, Dgayli, Khabib, MD, 5,000 Units at 06/13/23 0509   insulin aspart (novoLOG) injection 0-9 Units, 0-9 Units, Subcutaneous, Q4H, Ezequiel Essex, NP, 1 Units at 06/13/23 0829   ipratropium-albuterol (DUONEB) 0.5-2.5 (3) MG/3ML nebulizer solution 3 mL, 3 mL, Nebulization, Q6H PRN, Ezequiel Essex, NP   lactulose (CHRONULAC) 10 GM/15ML solution 20 g, 20 g, Per Tube, TID, Dgayli, Khabib, MD, 20 g at 06/12/23 2113   levETIRAcetam (KEPPRA) IVPB 1000 mg/100 mL premix, 1,000 mg, Intravenous, Q12H, [name], MD, Stopped at 06/12/23 2332   multivitamin liquid 15 mL, 15 mL,  Per Tube, Daily, Ezequiel Essex, NP, 15 mL at 06/12/23 8119   norepinephrine (LEVOPHED) 16 mg in (0.064 mg/mL) premix infusion, 0-40 mcg/min, Intravenous, Continuous, [place], Hubbard Hartshorn, NP, Stopped at 06/12/23 2217   Oral care mouth rinse, 15 mL, Mouth Rinse, Q2H, Ezequiel Essex, NP, 15 mL at 06/13/23 1478   Oral care mouth rinse, 15 mL, Mouth Rinse, PRN, Ezequiel Essex, NP   pantoprazole (PROTONIX) injection 40 mg, 40 mg, Intravenous, Q24H, Ezequiel Essex, NP, 40 mg at 06/12/23 1422   polyethylene glycol  (MIRALAX / GLYCOLAX) packet 17 g, 17 g, Oral, Daily PRN, Ezequiel Essex, NP   potassium PHOSPHATE 30 mmol in dextrose 5 % 500 mL infusion, 30 mmol, Intravenous, Once, Effie Shy, [facility], Last Rate: 85 mL/hr at 06/13/23 0845, 30 mmol at 06/13/23 0845   propofol (DIPRIVAN) 1000 MG/100ML infusion, 5-80 mcg/kg/min, Intravenous, Titrated, Jimmye Norman, NP, Last Rate: 21 mL/hr at 06/13/23 0600, 45 mcg/kg/min at 06/13/23 0600   thiamine (VITAMIN B1) 500 mg in sodium chloride 0.9 % 50 mL IVPB, 500 mg, Intravenous, Daily, Stopped at 06/12/23 1001 **FOLLOWED BY** [START ON 06/30/2023] thiamine (VITAMIN B1) injection 100 mg, 100 mg, Intravenous, Daily, Ezequiel Essex, NP  Labs and Diagnostic Imaging   CBC:  Recent Labs  Lab 06/12/23 0400 06/13/23 0417  WBC 15.6* 9.7  HGB 13.2 11.5*  HCT 40.4 36.1  MCV 98.3 99.7  PLT 179 135*    Basic Metabolic Panel:  Lab Results  Component Value Date   NA 139 06/13/2023   K 3.8 06/13/2023   CO2 26 06/13/2023   GLUCOSE 140 (H) 06/13/2023   BUN 22 (H) 06/13/2023   CREATININE 1.51 (H) 06/13/2023   CALCIUM 8.6 (L) 06/13/2023   GFRNONAA 45 (L) 06/13/2023   GFRAA >60 12/10/2019   Lipid Panel:  Lab Results  Component Value Date   LDLCALC 85 02/06/2023   HgbA1c:  Lab Results  Component Value Date   HGBA1C 5.3 06/10/2023   Urine Drug Screen:     Component Value Date/Time   LABOPIA POSITIVE (A) 06/10/2023 1129   COCAINSCRNUR NONE DETECTED 06/10/2023 1129   LABBENZ POSITIVE (A) 06/10/2023 1129   AMPHETMU NONE DETECTED 06/10/2023 1129   THCU POSITIVE (A) 06/10/2023 1129   LABBARB NONE DETECTED 06/10/2023 1129    Alcohol Level     Component Value Date/Time   ETH <10 06/10/2023 1447   INR  Lab Results  Component Value Date   INR 1.4 (H) 06/10/2023   APTT  Lab Results  Component Value Date   APTT 37 (H) 06/10/2023    Imaging personally reviewed CT Head without contrast No acute changes  MRI brain without contrast: Extensive  abnormal cortical restricted diffusion worst at the occipital lobe and perirolandic gyri-consistent with anoxic brain injury.  Rapid continuous EEG ([place]): Continuous slowing   Assessment   Katie Stanley is a 38 y.o. female past medical history of alcohol abuse complicated by withdrawal seizures who presented after V-fib arrest after witnessed seizure secondary to alcohol withdrawal.  Appeared to be in status myoclonus, started on Versed drip which was eventually changed to propofol. Initially on arrival had florid myoclonic jerking which improved with sedation.  Now off of sedation has extremely poor exam with minimal brainstem reflexes present but no purposeful hide brain function elicitable. MRI brain shows extensive anoxic damage. EEG shows generalized slowing. Clinically picture is consistent with severe anoxic brain injury and chances of meaningful neurological recovery  are none to grim at this point. Goals of care conversations for compassionate extubation should be the next course of action   Impression: Anoxic brain injury status postcardiac arrest  Recommendations   From a prognostication standpoint, with a poor exam as documented above as well as severe anoxic changes on the MRI, as well as poor cardiac status-chances of a neurological meaningful recovery are grim  I would recommend and encourage goals of care conversation towards comfort measures.   Plan discussed with Dr. Aundria Rud ______________________________________________________________________   Signed, Milon Dikes, MD [facility]  CRITICAL CARE ATTESTATION Performed by: Milon Dikes, MD Total critical care time: 31 minutes Critical care time was exclusive of separately billable procedures and treating other patients and/or supervising APPs/Residents/Students Critical care was necessary to treat or prevent imminent or life-threatening deterioration. This patient is critically ill and at  significant risk for neurological worsening and/or death and care requires constant monitoring. Critical care was time spent personally by me on the following activities: development of treatment plan with patient and/or surrogate as well as nursing, discussions with consultants, evaluation of patient's response to treatment, examination of patient, obtaining history from patient or surrogate, ordering and performing treatments and interventions, ordering and review of laboratory studies, ordering and review of radiographic studies, pulse oximetry, re-evaluation of patient's condition, participation in multidisciplinary rounds and medical decision making of high complexity in the care of this patient.

## 2023-06-13 NOTE — Progress Notes (Signed)
 Rounding Note    Patient Name: Katie Stanley Date of Encounter: 06/13/2023  [facility] Cardiologist: Julien Nordmann, MD   Subjective   Patient remains intubated, not sedated at this time. Does not respond to commands. Telemetry shows sinus tachycardia.   Inpatient Medications    Scheduled Meds:  aspirin  81 mg Per Tube Daily   Chlorhexidine Gluconate Cloth  6 each Topical Q0600   docusate  100 mg Per Tube BID   feeding supplement (PROSource TF20)  60 mL Per Tube BID   folic acid  1 mg Intravenous Daily   free water  30 mL Per Tube Q4H   heparin injection (subcutaneous)  5,000 Units Subcutaneous Q8H   insulin aspart  0-9 Units Subcutaneous Q4H   lactulose  20 g Per Tube TID   multivitamin  15 mL Per Tube Daily   mouth rinse  15 mL Mouth Rinse Q2H   pantoprazole (PROTONIX) IV  40 mg Intravenous Q24H   [START ON 07/02/2023] thiamine (VITAMIN B1) injection  100 mg Intravenous Daily   Continuous Infusions:  ceFEPime (MAXIPIME) IV Stopped (06/12/23 2142)   feeding supplement (VITAL 1.5 CAL) 45 mL/hr at 06/13/23 0600   levETIRAcetam Stopped (06/12/23 2332)   norepinephrine (LEVOPHED) Adult infusion Stopped (06/12/23 2217)   potassium PHOSPHATE IVPB (in mmol) 30 mmol (06/13/23 0845)   propofol (DIPRIVAN) infusion 45 mcg/kg/min (06/13/23 0600)   thiamine (VITAMIN B1) injection Stopped (06/12/23 1001)   PRN Meds: acetaminophen **OR** acetaminophen (TYLENOL) oral liquid 160 mg/5 mL **OR** acetaminophen, docusate sodium, fentaNYL (SUBLIMAZE) injection, fentaNYL (SUBLIMAZE) injection, ipratropium-albuterol, mouth rinse, polyethylene glycol   Vital Signs    Vitals:   06/13/23 0715 06/13/23 0730 06/13/23 0745 06/13/23 0815  BP: 104/66 98/64 106/72 98/67  Pulse: (!) 114 (!) 114 (!) 116 (!) 115  Resp: 20 20 18  (!) 21  Temp: 98.4 F (36.9 C) 98.4 F (36.9 C) 98.4 F (36.9 C) 98.6 F (37 C)  TempSrc:      SpO2: 97% 96% 100% 94%  Weight:      Height:         Intake/Output Summary (Last 24 hours) at 06/13/2023 0932 Last data filed at 06/13/2023 0600 Gross per 24 hour  Intake 2719.32 ml  Output 50 ml  Net 2669.32 ml      06/13/2023    3:20 AM 06/12/2023    3:10 AM 06/11/2023    3:15 AM  Last 3 Weights  Weight (lbs) 180 lb 5.4 oz 156 lb 12 oz 167 lb 12.3 oz  Weight (kg) 81.8 kg 71.1 kg 76.1 kg      Telemetry    Sinus tachycardia with rate 100-130 bpm - Personally Reviewed  Physical Exam   GEN: Patient is intubated. Does not respond to commands. No acute distress.  Neck: No JVD Cardiac: tachycardic, normal rhythm, no murmurs, rubs, or gallops.  Respiratory: Clear to auscultation anteriorly. Remains intubated.  GI: Soft, nontender, non-distended  MS: No edema; No deformity.  Labs    High Sensitivity Troponin:   Recent Labs  Lab 06/10/23 1129 06/10/23 1344 06/10/23 1831  TROPONINIHS 968* 11,582* >24,000*     Chemistry Recent Labs  Lab 06/10/23 1156 06/10/23 1447 06/11/23 1558 06/11/23 1807 06/12/23 0400 06/12/23 1701 06/13/23 0417  NA  --    < >  --    < > 140 138 139  K  --    < >  --    < > 3.1* 3.9 3.8  CL  --    < >  --    < > 100 101 102  CO2  --    < >  --    < > 27 24 26   GLUCOSE  --    < >  --    < > 135* 154* 140*  BUN  --    < >  --    < > 17 18 22*  CREATININE  --    < >  --    < > 1.59* 1.47* 1.51*  CALCIUM  --    < >  --    < > 8.4* 8.4* 8.6*  MG  --    < > 2.0  --  1.8  --  1.8  PROT 6.9  --   --   --  6.3*  --  6.1*  ALBUMIN 2.7*  --   --   --  2.2*  --  2.4*  AST 226*  --   --   --  132*  --  137*  ALT 38  --   --   --  32  --  26  ALKPHOS 263*  --   --   --  183*  --  167*  BILITOT 3.4*  --   --   --  3.2*  --  2.8*  GFRNONAA  --    < >  --    < > 43* 47* 45*  ANIONGAP  --    < >  --    < > 13 13 11    < > = values in this interval not displayed.    Lipids  Recent Labs  Lab 06/11/23 0351  TRIG 75    Hematology Recent Labs  Lab 06/11/23 0351 06/12/23 0400 06/13/23 0417  WBC 19.0* 15.6*  9.7  RBC 4.24 4.11 3.62*  HGB 13.6 13.2 11.5*  HCT 41.6 40.4 36.1  MCV 98.1 98.3 99.7  MCH 32.1 32.1 31.8  MCHC 32.7 32.7 31.9  RDW 17.1* 18.0* 18.6*  PLT 220 179 135*   Thyroid No results for input(s): "TSH", "FREET4" in the last 168 hours.  BNP Recent Labs  Lab 06/10/23 1129  BNP 114.1*    DDimer No results for input(s): "DDIMER" in the last 168 hours.   Radiology    DG Chest Port 1 View Result Date: 06/12/2023 Electronically Signed   By: Kennith Center M.D.   On: 06/12/2023 07:28   MR BRAIN WO CONTRAST Result Date: 06/11/2023 IMPRESSION: 1. Multifocal abnormal cortical diffusion restriction, worst in the occipital lobes and the perirolandic gyri. The pattern is consistent with anoxic brain injury. 2. Small right parietal scalp hematoma. Electronically Signed   By: Deatra Robinson M.D.   On: 06/11/2023 17:16   EEG adult Result Date: 06/11/2023 IMPRESSION: This study showed evidence of epileptogenicity arising from bilateral posterior quadrant. Due to the frequency of 2.5 to 3 Hz, this EEG pattern is highly suspicious for ictal nature. Additionally there was severe diffuse encephalopathy.  Dr. Wilford Corner was notified. Priyanka Annabelle Harman   Rapid EEG Result Date: 06/11/2023 IMPRESSION: This study initially showed evidence of epileptogenicity with generalized onset.  Given the duration and morphology of epileptiform bursts, this pattern was most likely ictal in nature.  ICU team was notified and medications were adjusted.  Gradually after around 2330 on 06/10/2023, EEG improved and was suggestive of  severe diffuse encephalopathy, likely related to sedation. Please note that this is a limited montage  ceribell EEG without video.  Therefore if concern for ictal-interictal activity persist, consider conventional EEG with video for further evaluation. Charlsie Quest   DG Chest 1 View Result Date: 06/10/2023 IMPRESSION: Left internal jugular central venous catheter tip overlies the superior vena cava. No  pneumothorax. Electronically Signed   By: Romona Curls M.D.   On: 06/10/2023 17:02   CT ABDOMEN PELVIS WO CONTRAST Result Date: 06/10/2023 IMPRESSION: 1. Left lower lobe collapse. 2. Diffuse hepatic steatosis. Nodularity of the left hepatic lobe suspicious for cirrhosis. Electronically Signed   By: Acquanetta Belling M.D.   On: 06/10/2023 15:38   CT HEAD WO CONTRAST ( ) Result Date: 06/10/2023 IMPRESSION: No acute intracranial finding.  No visible anoxic injury. Negative cervical spine CT. Electronically Signed   By: Tiburcio Pea M.D.   On: 06/10/2023 12:47   CT Cervical Spine Wo Contrast Result Date: 06/10/2023 IMPRESSION: No acute intracranial finding.  No visible anoxic injury. Negative cervical spine CT. Electronically Signed   By: Tiburcio Pea M.D.   On: 06/10/2023 12:47   DG Chest Port 1 View Result Date: 06/10/2023 IMPRESSION: 1. Endotracheal tube is positioned with tip in the midtrachea. 2. Esophagogastric tube with tip and side port below the diaphragm. 3. Evaluation of the heart and lungs very limited by overlying compression board; no obvious abnormality. Electronically Signed   By: Jearld Lesch M.D.   On: 06/10/2023 12:10    Cardiac Studies   06/12/2023 Echo complete 1. Left ventricular ejection fraction, by estimation, is 25 to 30%. The  left ventricle has severely decreased function. The left ventricle  demonstrates global hypokinesis. There is mild left ventricular  hypertrophy. Indeterminate diastolic filling due  to E-A fusion.   2. Right ventricular systolic function is normal. The right ventricular  size is normal. Tricuspid regurgitation signal is inadequate for assessing  PA pressure.   3. The mitral valve is normal in structure. Trivial mitral valve  regurgitation.   4. The aortic valve has an indeterminant number of cusps. Aortic valve  regurgitation is not visualized. No aortic stenosis is present.   04/12/2023 Echo complete 1. Left ventricular ejection fraction,  by estimation, is 55 to 60%. The  left ventricle has normal function. The left ventricle has no regional  wall motion abnormalities. There is mild left ventricular hypertrophy.  Indeterminate diastolic filling due to  E-A fusion.   2. Right ventricular systolic function is normal. The right ventricular  size is normal. Tricuspid regurgitation signal is inadequate for assessing  PA pressure.   3. The mitral valve is normal in structure. No evidence of mitral valve  regurgitation. No evidence of mitral stenosis.   4. The aortic valve has an indeterminant number of cusps. Aortic valve  regurgitation is not visualized. No aortic stenosis is present.   Patient Profile     Katie Stanley is a 38 y.o. 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   Assessment & Plan    Ventricular fibrillation arrest in the setting of severe seizures due to alcohol withdrawal Sinus tachycardia - Presented 3/2 after witnessed seizure like activities with EMS reporting vfib requiring defibrillation with epi x 4, amiodarone, and 2 amps of bicarb before ROSC - Continue supportive care - Echo showed LVEF 25-30% - Recommend K > 4 and mag >2 - Telemetry without arrhythmia - No  indication for antiarrhythmic medications at this time - Could consider addition of small dose BB for rate control  HFrEF - Echo this admission with newly reduced EF of 25-30% - Given age, cardiomyopathy is likely nonischemic although troponin >24000 in the setting of vfib arrest - Appears euvolemic on exam - Currently not a candidate for GDMT in the setting of hypotension  NSTEMI - Echo 04/2023 with normal LVEF, repeat echo results pending - Troponin > 16109 on admission - Demand ischemia in the setting of cardiac arrest and ventricular fibrillation vs primary cardiac  event - Continue ASA 81 mg daily - No statin in the setting of abnormal liver function tests - Can consider cardiac cath if patient makes meaningful neurologic recovery  Status myoclonus Concern for anoxic injury - Neurology following - Overall poor prognosis  For questions or updates, please contact [facility] Please consult [email or web address] for contact info under        Signed, Orion Crook, PA-C  06/13/2023, 9:32 AM

## 2023-06-13 NOTE — Progress Notes (Signed)
Pt transported to CT and returned on the vent without incident. Pt remains on the vent and is tol well at this time. 

## 2023-06-13 NOTE — Progress Notes (Addendum)
 PHARMACY CONSULT NOTE - FOLLOW UP  Pharmacy Consult for Electrolyte Monitoring and Replacement   Recent Labs: Potassium (mmol/L)  Date Value  06/13/2023 3.8   Magnesium (mg/dL)  Date Value  41/66/0630 1.8   Calcium (mg/dL)  Date Value  16/04/930 8.6 (L)   Albumin (g/dL)  Date Value  35/57/3220 2.4 (L)  05/08/2023 3.9   Phosphorus (mg/dL)  Date Value  25/42/7062 1.4 (L)   Sodium (mmol/L)  Date Value  06/13/2023 139  05/08/2023 140   Assessment: 38 yo female with a PMH of ETOH abuse, seizure activity secondary to ETOH withdrawal, and alcoholic liver cirrhosis. Pharmacy is asked to follow and replace electrolytes while in CCU  Goal of Therapy:  Potassium 4.0 - 5.1 mmol/L Magnesium 2.0 - 2.4 mg/dL All Other Electrolytes WNL  Plan:  --- Order K Phos 30 mmol IV x 1 --- 2 grams IV magneisum sulfate x 1 --- Recheck phos in the afternoon  --- Recheck electrolytes in am  Burnis Medin, PharmD, BCPS 06/13/2023 6:22 AM

## 2023-06-13 NOTE — Progress Notes (Signed)
 Bronchoscopy Procedure Note  Katie Stanley  [id number]  08-18-1985  Date:06/13/23  Time:6:10 PM   Provider Performing:Jessice Madill   Procedure(s):  Flexible bronchoscopy with bronchial alveolar lavage (40981)  Indication(s) Organ transplant evaluation  Consent Risks of the procedure as well as the alternatives and risks of each were explained to the patient and/or caregiver.  Consent for the procedure was obtained and is signed in the bedside chart  Anesthesia Propofol and fentanyl   Time Out Verified patient identification, verified procedure, site/side was marked, verified correct patient position, special equipment/implants available, medications/allergies/relevant history reviewed, required imaging and test results available.   Sterile Technique Usual hand hygiene, masks, gowns, and gloves were used   Procedure Description Bronchoscope advanced through endotracheal tube and into airway.  Airways were examined down to subsegmental level with findings noted below.   Following diagnostic evaluation, BAL(s) performed in RML with normal saline and return of 40 ml of fluid  Findings: Thick secretions in the right mainstem and left mainstem bronchi, easily suctioned. No endobronchial lesions noted. BAL performed in the RML, 100 mL in, 40 mL out.   Complications/Tolerance None; patient tolerated the procedure well. Chest X-ray is not needed post procedure.   EBL Minimal   Specimen(s) Respiratory culture for BAL.   [name], MD [facility] 06/13/2023 6:11 PM

## 2023-06-13 NOTE — Consult Note (Addendum)
 Consultation Note Date: 06/13/2023   Patient Name: Katie Stanley  DOB: Aug 24, 1985  MRN: [id number]  Age / Sex: 38 y.o., female  PCP: Dorcas Carrow, DO Referring Physician: [name], MD  Reason for Consultation: Establishing goals of care  HPI/Patient Profile: PER H&P: "This is a 38 yo female with a PMH of ETOH abuse, seizure activity secondary to ETOH withdrawal, and alcoholic liver cirrhosis.  She presented to [facility] ER on 03/2 via EMS from home after pts family witnessed the pt having seizure-like activity (staring spell followed by abnormal respirations then tensing up with abnormal respirations followed by "shaking activity" resulting in tongue trauma) and subsequent loss of consciousness.  Per ER notes EMS reported pt v fib arrested requiring epi x4, amiodarone load, 2 amps of bicarb, and defibrillation x1 prior to ROSC after 23 minutes.  Pts significant other reported to EDP the pt has had heavy alcohol abuse over the past week with 1/2 pint of liquor daily.  She attempted to stop over the last few days and initially had seizure activity on 03/1.  Therefore, pt had an alcoholic beverage following the seizure activity in an attempt to prevent recurrent seizure activity.  However, the pt did not have an alcoholic beverage today".    Clinical Assessment and Goals of Care: Notes and labs reviewed in detail. Patient is known to me from a previous admission (Please see those notes for details). She has a S.O. and a son with him. She has living parents. No AD was not completed last admission.   Per CCM, family meeting occurred today with patient's parents and S.O. Per discussion, all agreed on plans to shift to comfort care tomorrow.   Patient is in bed on ventilator. Numerous family members at bedside. Mother stepped out to speak with me. She discusses that they are a family of faith. We discuss [place], and  she reflects on Forrestine's life. She reflects positivity at her recent desire to stop drinking alcohol. Support offered.   She is at peace with the decision to shift to comfort care and is aware of patient's decision to be an organ donor. She would like to speak with organ donor services. Organ donor services present to meet with family.     SUMMARY OF RECOMMENDATIONS   GOC discussion between CCM and family. Plans in place to terminally extubate tomorrow.   [facility] is following as patient wanted to be an organ donor.  [facility] liaison contacting [facility] to reach out to family for patient's son.       Primary Diagnoses: Present on Admission:  Acute hypoxic respiratory failure (HCC)   I have reviewed the medical record, interviewed the patient and family, and examined the patient. The following aspects are pertinent.  Past Medical History:  Diagnosis Date   Alcohol abuse    Anxiety    Biliary calculi 12/07/2009   Cholelithiasis    Depression    Diet-controlled diabetes mellitus (HCC)    GERD (gastroesophageal reflux disease)    Hepatitis  C    body "self healed" per patient   LVH (left ventricular hypertrophy)    a. 10/2022 Echo: EF 60-65%, no rwma, sev conc LVH, nl RV size/fxn, triv MR. Mobile density near ant MV leaflet- felt to be redundant chordae tendinae..   Morbid obesity (HCC)    Palpitations    a. 07/2019 Holter: Sinus rhythm, PVCs, rare PACs.  No significant arrhythmias.   Pancreatic pseudocyst    a. 02/2023 MRI abd: improving unilocular pancreatic pseudocyst.   Retained intrauterine contraceptive device (IUD) 01/20/2020   Substance abuse (HCC)    Social History   Socioeconomic History   Marital status: Single    Spouse name: Not on file   Number of children: Not on file   Years of education: Not on file   Highest education level: Not on file  Occupational History   Not on file  Tobacco Use   Smoking status: Every Day    Current packs/day: 1.00     Average packs/day: 1 pack/day for 12.0 years (12.0 ttl pk-yrs)    Types: Cigarettes   Smokeless tobacco: Never  Vaping Use   Vaping status: Never Used  Substance and Sexual Activity   Alcohol use: Yes    Comment: less than a 5th a day. 06/05/23 states she is trying to stop drinking has a couple slip ups since 04/08/2024 but cut back a lot   Drug use: Yes    Types: Marijuana    Comment: 09/28/2021   Sexual activity: Yes    Birth control/protection: Implant, I.U.D.  Other Topics Concern   Not on file  Social History Narrative   Not on file   Social Drivers of Health   Financial Resource Strain: Low Risk  (02/26/2023)   Overall Financial Resource Strain (CARDIA)    Difficulty of Paying Living Expenses: Not hard at all  Food Insecurity: No Food Insecurity (06/12/2023)   Hunger Vital Sign    Worried About Running Out of Food in the Last Year: Never true    Ran Out of Food in the Last Year: Never true  Transportation Needs: Patient Unable To Answer (06/12/2023)   PRAPARE - Transportation    Lack of Transportation (Medical): Patient unable to answer    Lack of Transportation (Non-Medical): Patient unable to answer  Physical Activity: Insufficiently Active (02/26/2023)   Exercise Vital Sign    Days of Exercise per Week: 2 days    Minutes of Exercise per Session: 40 min  Stress: No Stress Concern Present (02/26/2023)   [employer] of Occupational Health - Occupational Stress Questionnaire    Feeling of Stress : Not at all  Social Connections: Moderately Isolated (04/17/2023)   Social Connection and Isolation Panel [NHANES]    Frequency of Communication with Friends and Family: More than three times a week    Frequency of Social Gatherings with Friends and Family: Three times a week    Attends Religious Services: Never    Active Member of Clubs or Organizations: No    Attends Banker Meetings: Never    Marital Status: Living with partner   Family History  Problem  Relation Age of Onset   Hypertension Mother    Diabetes Father    Hypertension Father    Liver cancer Maternal Grandmother    Hypertension Maternal Grandfather    Breast cancer Paternal Grandmother    Heart disease Paternal Grandfather    Scheduled Meds:  aspirin  81 mg Per Tube Daily   Chlorhexidine Gluconate  Cloth  6 each Topical Q0600   docusate  100 mg Per Tube BID   docusate  100 mg Per Tube BID   feeding supplement (PROSource TF20)  60 mL Per Tube BID   fentaNYL (SUBLIMAZE) injection  50 mcg Intravenous Once   folic acid  1 mg Intravenous Daily   free water  30 mL Per Tube Q4H   heparin injection (subcutaneous)  5,000 Units Subcutaneous Q8H   insulin aspart  0-9 Units Subcutaneous Q4H   lactulose  20 g Per Tube TID   metoprolol tartrate  12.5 mg Per Tube Q6H   multivitamin  15 mL Per Tube Daily   mouth rinse  15 mL Mouth Rinse Q2H   pantoprazole (PROTONIX) IV  40 mg Intravenous Q24H   polyethylene glycol  17 g Per Tube Daily   [START ON 07/08/2023] thiamine (VITAMIN B1) injection  100 mg Intravenous Daily   Continuous Infusions:  ceFEPime (MAXIPIME) IV Stopped (06/13/23 1041)   feeding supplement (VITAL 1.5 CAL) 45 mL/hr at 06/13/23 1416   fentaNYL infusion INTRAVENOUS     levETIRAcetam Stopped (06/13/23 1210)   magnesium sulfate bolus IVPB     norepinephrine (LEVOPHED) Adult infusion Stopped (06/12/23 2217)   propofol (DIPRIVAN) infusion 45 mcg/kg/min (06/13/23 1416)   PRN Meds:.acetaminophen **OR** acetaminophen (TYLENOL) oral liquid 160 mg/5 mL **OR** acetaminophen, docusate sodium, fentaNYL, fentaNYL (SUBLIMAZE) injection, fentaNYL (SUBLIMAZE) injection, ipratropium-albuterol, mouth rinse, polyethylene glycol Medications Prior to Admission:  Prior to Admission medications   Medication Sig Start Date End Date Taking? Authorizing Provider  acetaminophen (TYLENOL) 500 MG tablet Take 1 tablet (500 mg total) by mouth 3 (three) times daily as needed for headache, fever or  moderate pain (pain score 4-6). 04/18/23  Yes Loyce Dys, MD  citalopram (CELEXA) 20 MG tablet TAKE 1.5 TABLET(20 MG) BY MOUTH DAILY 02/20/23  Yes Johnson, Megan P, DO  famotidine (PEPCID) 20 MG tablet Take 20 mg by mouth 2 (two) times daily.   Yes [provider]  fluticasone furoate-vilanterol (BREO ELLIPTA) 200-25 MCG/ACT AEPB Inhale 1 puff into the lungs daily. 04/19/23  Yes Loyce Dys, MD  furosemide (LASIX) 40 MG tablet Take 1 tablet (40 mg total) by mouth 2 (two) times daily. 05/08/23  Yes Johnson, Megan P, DO  gabapentin (NEURONTIN) 300 MG capsule Take 1 capsule (300 mg total) by mouth at bedtime. Increase to twice daily if able to tolerate without any drowsiness 06/05/23  Yes Vanga, Loel Dubonnet, MD  Iron, Ferrous Sulfate, 325 (65 Fe) MG TABS Take 325 mg by mouth daily. 03/07/23  Yes Johnson, Megan P, DO  lactulose (CHRONULAC) 10 GM/15ML solution TAKE 15 MLS BY MOUTH DAILY 05/23/23  Yes Johnson, Megan P, DO  lidocaine (LIDODERM) 5 % Place 1 patch onto the skin daily. Remove & Discard patch within 12 hours or as directed by MD 04/28/22  Yes Johnson, Megan P, DO  MAGNESIUM PO Take 1 each by mouth in the morning and at bedtime. OTC patient is unsure of the dost   Yes [provider]  methocarbamol (ROBAXIN) 500 MG tablet Take 1 tablet (500 mg total) by mouth 2 (two) times daily. 05/08/23  Yes Johnson, Megan P, DO  metoprolol succinate (TOPROL-XL) 25 MG 24 hr tablet Take 1 tablet (25 mg total) by mouth daily. 02/20/23  Yes Johnson, Megan P, DO  Multiple Vitamin (MULTIVITAMIN WITH MINERALS) TABS tablet Take 1 tablet by mouth daily. 02/13/23  Yes Regalado, Belkys A, MD  pantoprazole (PROTONIX) 40 MG  tablet Take 1 tablet (40 mg total) by mouth 2 (two) times daily before a meal. 05/08/23 06/10/23 Yes Johnson, Megan P, DO  potassium chloride SA (KLOR-CON M) 20 MEQ tablet TAKE 1 TABLET(20 MEQ) BY MOUTH DAILY 03/20/23  Yes Johnson, Megan P, DO  folic acid (FOLVITE) 1 MG tablet Take 1 tablet  (1 mg total) by mouth daily. Patient not taking: Reported on 06/10/2023 02/13/23   Hartley Barefoot A, MD  spironolactone (ALDACTONE) 50 MG tablet Take 1 tablet (50 mg total) by mouth daily. Patient not taking: Reported on 06/07/2023 05/08/23   Olevia Perches P, DO   Allergies  Allergen Reactions   Penicillins Swelling    Tolerated ceftriaxone and cefepime in past per [facility] and [facility] records   Review of Systems  Unable to perform ROS   Physical Exam Constitutional:      Comments: Eyes semi-closed.   Pulmonary:     Comments: On ventilator.     Vital Signs: BP 111/74   Pulse (!) 141   Temp 99.7 F (37.6 C)   Resp (!) 27   Ht 5\' 3"  (1.6 m)   Wt 81.8 kg   LMP  (LMP Unknown)   SpO2 93%   BMI 31.95 kg/m  Pain Scale: CPOT   Pain Score: 0-No pain   SpO2: SpO2: 93 % O2 Device:SpO2: 93 % O2 Flow Rate: .   IO: Intake/output summary:  Intake/Output Summary (Last 24 hours) at 06/13/2023 1505 Last data filed at 06/13/2023 1416 Gross per 24 hour  Intake 2330.81 ml  Output 65 ml  Net 2265.81 ml    LBM: Last BM Date : 06/13/23 Baseline Weight: Weight: 77.1 kg Most recent weight: Weight: 81.8 kg      Signed by: Morton Stall, NP   Please contact Palliative Medicine Team phone at [phone] for questions and concerns.  For individual provider: See Loretha Stapler

## 2023-06-13 NOTE — IPAL (Signed)
 Interdisciplinary Goals of Care Family Meeting    Date carried out: 06/13/2023  Location of the meeting: Conference room  Member's involved: Physician and Family Member or next of kin  Durable Power of Attorney or Environmental health practitioner: Mother, Katie Stanley, father, and other family members.    Discussion: We discussed goals of care for Katie Stanley. 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 Explained that she is showing signs of significant anoxia, with status myoclonus on presentation and MRI brain showing hypoxic injury. Also discussed respiratory failure and liver cirrhosis. Explored with family that her chances of any meaningful recovery are very slim, and the most likely scenario will be a vegetative state. Explained that in a situation like that patients would require a tracheostomy tube, feeding tube, and placement in a nursing facility. Mother and Father expressed that this does not align with their goals,. Explored options of comfort measures, with withdrawal of care and a compassionate extubation. They would like for other family members to be at her bedside (including her 62 year old son) today to say their goodbyes, with plans for compassionate one way extubation tomorrow.  Code status:   Code Status: Full Code   Disposition: In-patient comfort care  Time spent for the meeting: 20 minutes     [name], MD  06/13/2023, 2:59 PM

## 2023-06-13 NOTE — TOC Progression Note (Signed)
 Transition of Care [facility]) - Progression Note    Patient Details  Name: Katie Stanley MRN: [id number] Date of Birth: 1985-08-04  [facility] CM/SW Contact  Garret Reddish, RN Phone Number: 06/13/2023, 12:47 PM  Clinical Narrative:    Chart reviewed.  Patient was admitted with Acute Hypoxic Respiratory Failure and Cardiac arrest.  Patient continues to remain intubated and unresponsive. Their is some concern for Anoxic Brain injury v/s seizure disorder.  Mother will come in today to discuss goals of care.    TOC will continue to follow for discharge planning.          Expected Discharge Plan and Services                                               Social Determinants of Health (SDOH) Interventions SDOH Screenings   Food Insecurity: No Food Insecurity (06/12/2023)  Housing: Unknown (06/12/2023)  Transportation Needs: Patient Unable To Answer (06/12/2023)  Utilities: Patient Unable To Answer (06/12/2023)  Alcohol Screen: Low Risk  (02/26/2023)  Depression (PHQ2-9): Low Risk  (06/07/2023)  Recent Concern: Depression (PHQ2-9) - Medium Risk (05/08/2023)  Financial Resource Strain: Low Risk  (02/26/2023)  Physical Activity: Insufficiently Active (02/26/2023)  Social Connections: Moderately Isolated (04/17/2023)  Stress: No Stress Concern Present (02/26/2023)  Tobacco Use: High Risk (06/11/2023)  Health Literacy: Adequate Health Literacy (02/26/2023)    Readmission Risk Interventions    04/19/2023    8:50 AM  Readmission Risk Prevention Plan  Transportation Screening Complete  Medication Review (RN Care Manager) Complete  PCP or Specialist appointment within 3-5 days of discharge Complete  HRI or Home Care Consult Complete  SW Recovery Care/Counseling Consult Complete  Palliative Care Screening Not Applicable  Skilled Nursing Facility Not Applicable

## 2023-06-13 NOTE — Progress Notes (Signed)
 Recruitment Maneuver done with PEEP 15 for 15 seconds, then placed patient on 100% for 30 minutes followed by an ABG. All verbal orders per donor services at pt bedside.

## 2023-06-13 NOTE — Procedures (Signed)
 Arterial Catheter Insertion Procedure Note  Katie Stanley  [id number]  1985-04-15  Date:06/13/23  Time:6:38 PM    Provider Performing: Ezequiel Essex    Procedure: Insertion of Arterial Line (21308) with US guidance ([id number])   Indication(s) Blood pressure monitoring and/or need for frequent ABGs  Consent Risks of the procedure as well as the alternatives and risks of each were explained to the patient and/or caregiver.  Consent for the procedure was obtained and is signed in the bedside chart  Anesthesia None   Time Out Verified patient identification, verified procedure, site/side was marked, verified correct patient position, special equipment/implants available, medications/allergies/relevant history reviewed, required imaging and test results available.   Sterile Technique Maximal sterile technique including full sterile barrier drape, hand hygiene, sterile gown, sterile gloves, mask, hair covering, sterile ultrasound probe cover (if used).   Procedure Description Area of catheter insertion was cleaned with chlorhexidine and draped in sterile fashion. With real-time ultrasound guidance an arterial catheter was placed into the right radial artery.  Appropriate arterial tracings confirmed on monitor.     Complications/Tolerance None; patient tolerated the procedure well.   EBL Minimal   Specimen(s) None  Zada Girt, AGNP  Pulmonary/Critical Care Pager [phone] (please enter 7 digits) PCCM Consult Pager [phone] (please enter 7 digits)

## 2023-06-14 ENCOUNTER — Inpatient Hospital Stay

## 2023-06-14 ENCOUNTER — Encounter: Payer: Self-pay | Admitting: Anesthesiology

## 2023-06-14 ENCOUNTER — Encounter
Admission: EM | Disposition: E | Payer: Self-pay | Source: Home / Self Care | Attending: Student in an Organized Health Care Education/Training Program

## 2023-06-14 DIAGNOSIS — J9601 Acute respiratory failure with hypoxia: Secondary | ICD-10-CM | POA: Diagnosis not present

## 2023-06-14 DIAGNOSIS — K7460 Unspecified cirrhosis of liver: Secondary | ICD-10-CM

## 2023-06-14 DIAGNOSIS — I469 Cardiac arrest, cause unspecified: Secondary | ICD-10-CM | POA: Diagnosis not present

## 2023-06-14 DIAGNOSIS — A419 Sepsis, unspecified organism: Secondary | ICD-10-CM | POA: Diagnosis not present

## 2023-06-14 DIAGNOSIS — N179 Acute kidney failure, unspecified: Secondary | ICD-10-CM | POA: Diagnosis not present

## 2023-06-14 DIAGNOSIS — Z7189 Other specified counseling: Secondary | ICD-10-CM | POA: Diagnosis not present

## 2023-06-14 HISTORY — PX: ORGAN PROCUREMENT: SHX5270

## 2023-06-14 LAB — BLOOD GAS, ARTERIAL
Acid-Base Excess: 5.4 mmol/L — ABNORMAL HIGH (ref 0.0–2.0)
Acid-Base Excess: 4.6 mmol/L — ABNORMAL HIGH (ref 0.0–2.0)
Bicarbonate: 29 mmol/L — ABNORMAL HIGH (ref 20.0–28.0)
Bicarbonate: 28.3 mmol/L — ABNORMAL HIGH (ref 20.0–28.0)
FIO2: 100 %
FIO2: 100 %
MECHVT: 500 mL
MECHVT: 500 mL
Mechanical Rate: 18
Mechanical Rate: 18
O2 Saturation: 100 %
O2 Saturation: 100 %
PEEP: 5 cmH2O
PEEP: 5 cmH2O
Patient temperature: 37
Patient temperature: 37
pCO2 arterial: 38 mmHg (ref 32–48)
pCO2 arterial: 38 mmHg (ref 32–48)
pH, Arterial: 7.49 — ABNORMAL HIGH (ref 7.35–7.45)
pH, Arterial: 7.48 — ABNORMAL HIGH (ref 7.35–7.45)
pO2, Arterial: 314 mmHg — ABNORMAL HIGH (ref 83–108)
pO2, Arterial: 276 mmHg — ABNORMAL HIGH (ref 83–108)

## 2023-06-14 LAB — CBC
HCT: 32.4 % — ABNORMAL LOW (ref 36.0–46.0)
HCT: 31.2 % — ABNORMAL LOW (ref 36.0–46.0)
Hemoglobin: 10.4 g/dL — ABNORMAL LOW (ref 12.0–15.0)
Hemoglobin: 10 g/dL — ABNORMAL LOW (ref 12.0–15.0)
MCH: 32.4 pg (ref 26.0–34.0)
MCH: 32.3 pg (ref 26.0–34.0)
MCHC: 32.1 g/dL (ref 30.0–36.0)
MCHC: 32.1 g/dL (ref 30.0–36.0)
MCV: 100.9 fL — ABNORMAL HIGH (ref 80.0–100.0)
MCV: 100.6 fL — ABNORMAL HIGH (ref 80.0–100.0)
Platelets: 128 10*3/uL — ABNORMAL LOW (ref 150–400)
Platelets: 128 10*3/uL — ABNORMAL LOW (ref 150–400)
RBC: 3.21 MIL/uL — ABNORMAL LOW (ref 3.87–5.11)
RBC: 3.1 MIL/uL — ABNORMAL LOW (ref 3.87–5.11)
RDW: 19.2 % — ABNORMAL HIGH (ref 11.5–15.5)
RDW: 19.2 % — ABNORMAL HIGH (ref 11.5–15.5)
WBC: 8.9 10*3/uL (ref 4.0–10.5)
WBC: 7.6 10*3/uL (ref 4.0–10.5)
nRBC: 0.3 % — ABNORMAL HIGH (ref 0.0–0.2)
nRBC: 0.3 % — ABNORMAL HIGH (ref 0.0–0.2)

## 2023-06-14 LAB — COMPREHENSIVE METABOLIC PANEL
ALT: 25 U/L (ref 0–44)
ALT: 25 U/L (ref 0–44)
AST: 137 U/L — ABNORMAL HIGH (ref 15–41)
AST: 135 U/L — ABNORMAL HIGH (ref 15–41)
Albumin: 2.6 g/dL — ABNORMAL LOW (ref 3.5–5.0)
Albumin: 2.5 g/dL — ABNORMAL LOW (ref 3.5–5.0)
Alkaline Phosphatase: 160 U/L — ABNORMAL HIGH (ref 38–126)
Alkaline Phosphatase: 154 U/L — ABNORMAL HIGH (ref 38–126)
Anion gap: 7 (ref 5–15)
Anion gap: 10 (ref 5–15)
BUN: 28 mg/dL — ABNORMAL HIGH (ref 6–20)
BUN: 29 mg/dL — ABNORMAL HIGH (ref 6–20)
CO2: 25 mmol/L (ref 22–32)
CO2: 25 mmol/L (ref 22–32)
Calcium: 8.3 mg/dL — ABNORMAL LOW (ref 8.9–10.3)
Calcium: 8.4 mg/dL — ABNORMAL LOW (ref 8.9–10.3)
Chloride: 106 mmol/L (ref 98–111)
Chloride: 106 mmol/L (ref 98–111)
Creatinine, Ser: 1.32 mg/dL — ABNORMAL HIGH (ref 0.44–1.00)
Creatinine, Ser: 1.25 mg/dL — ABNORMAL HIGH (ref 0.44–1.00)
GFR, Estimated: 53 mL/min — ABNORMAL LOW (ref >60)
GFR, Estimated: 57 mL/min — ABNORMAL LOW (ref >60)
Glucose, Bld: 130 mg/dL — ABNORMAL HIGH (ref 70–99)
Glucose, Bld: 102 mg/dL — ABNORMAL HIGH (ref 70–99)
Potassium: 4.2 mmol/L (ref 3.5–5.1)
Potassium: 4.2 mmol/L (ref 3.5–5.1)
Sodium: 138 mmol/L (ref 135–145)
Sodium: 141 mmol/L (ref 135–145)
Total Bilirubin: 2.6 mg/dL — ABNORMAL HIGH (ref 0.0–1.2)
Total Bilirubin: 2.8 mg/dL — ABNORMAL HIGH (ref 0.0–1.2)
Total Protein: 6.1 g/dL — ABNORMAL LOW (ref 6.5–8.1)
Total Protein: 5.9 g/dL — ABNORMAL LOW (ref 6.5–8.1)

## 2023-06-14 LAB — PROTIME-INR
INR: 1.3 — ABNORMAL HIGH (ref 0.8–1.2)
INR: 1.3 — ABNORMAL HIGH (ref 0.8–1.2)
Prothrombin Time: 15.9 s — ABNORMAL HIGH (ref 11.4–15.2)
Prothrombin Time: 16.2 s — ABNORMAL HIGH (ref 11.4–15.2)

## 2023-06-14 LAB — RESP PANEL BY RT-PCR (RSV, FLU A&B, COVID)  RVPGX2
Influenza A by PCR: NEGATIVE
Influenza B by PCR: NEGATIVE
Resp Syncytial Virus by PCR: NEGATIVE
SARS Coronavirus 2 by RT PCR: NEGATIVE

## 2023-06-14 LAB — URINALYSIS, COMPLETE (UACMP) WITH MICROSCOPIC
Bacteria, UA: NONE SEEN
Bilirubin Urine: NEGATIVE
Glucose, UA: NEGATIVE mg/dL
Ketones, ur: NEGATIVE mg/dL
Nitrite: NEGATIVE
Protein, ur: 100 mg/dL — AB
RBC / HPF: >50 RBC/hpf (ref 0–5)
Specific Gravity, Urine: 1.028 (ref 1.005–1.030)
Squamous Epithelial / HPF: 0 /HPF (ref 0–5)
pH: 5 (ref 5.0–8.0)

## 2023-06-14 LAB — MAGNESIUM: Magnesium: 2.4 mg/dL (ref 1.7–2.4)

## 2023-06-14 LAB — CK
Total CK: 175 U/L (ref 38–234)
Total CK: 145 U/L (ref 38–234)

## 2023-06-14 LAB — APTT
aPTT: 32 s (ref 24–36)
aPTT: 35 s (ref 24–36)

## 2023-06-14 LAB — ECHOCARDIOGRAM COMPLETE
Area-P 1/2: 9.85 cm2
Height: 63 in
S' Lateral: 3 cm
Weight: 2885.38 [oz_av]

## 2023-06-14 LAB — GLUCOSE, CAPILLARY
Glucose-Capillary: 109 mg/dL — ABNORMAL HIGH (ref 70–99)
Glucose-Capillary: 119 mg/dL — ABNORMAL HIGH (ref 70–99)
Glucose-Capillary: 108 mg/dL — ABNORMAL HIGH (ref 70–99)

## 2023-06-14 LAB — BILIRUBIN, DIRECT
Bilirubin, Direct: 1.5 mg/dL — ABNORMAL HIGH (ref 0.0–0.2)
Bilirubin, Direct: 1.4 mg/dL — ABNORMAL HIGH (ref 0.0–0.2)

## 2023-06-14 LAB — TRIGLYCERIDES: Triglycerides: 121 mg/dL (ref <150)

## 2023-06-14 LAB — PHOSPHORUS: Phosphorus: 3.4 mg/dL (ref 2.5–4.6)

## 2023-06-14 SURGERY — SURGICAL PROCUREMENT, ORGAN
Anesthesia: Choice

## 2023-06-14 MED ORDER — HEPARIN SODIUM (PORCINE) 1000 UNIT/ML IJ SOLN
8000.0000 [IU] | Freq: Once | INTRAMUSCULAR | Status: AC
  Administered 2023-06-14: 8000 [IU] via INTRAVENOUS
  Filled 2023-06-14: qty 8

## 2023-06-14 MED ORDER — MIDAZOLAM-SODIUM CHLORIDE 100-0.9 MG/100ML-% IV SOLN
0.5000 mg/h | INTRAVENOUS | Status: DC
  Administered 2023-06-14: 2 mg/h via INTRAVENOUS
  Filled 2023-06-14: qty 100

## 2023-06-14 SURGICAL SUPPLY — 20 items
BLADE CLIPPER SURG (BLADE) ×1 IMPLANT
BLADE STERNUM SYSTEM 6 (BLADE) ×1 IMPLANT
CHLORAPREP W/TINT 26 (MISCELLANEOUS) ×2 IMPLANT
DRAPE SHEET LG 3/4 BI-LAMINATE (DRAPES) IMPLANT
GLOVE BIOGEL M STRL SZ7.5 (GLOVE) IMPLANT
GLOVE PROTEXIS LATEX SZ 7.5 (GLOVE) ×3 IMPLANT
GLOVE SURG LATEX 7.5 PF (GLOVE) IMPLANT
GOWN SRG XL LVL 3 NONREINFORCE (GOWNS) IMPLANT
GOWN STRL REUS W/ TWL LRG LVL3 (GOWN DISPOSABLE) ×4 IMPLANT
KIT POST MORTEM ADULT 36X90 (BAG) ×1 IMPLANT
KIT TURNOVER KIT A (KITS) ×1 IMPLANT
MANIFOLD NEPTUNE II (INSTRUMENTS) ×3 IMPLANT
PACK BASIN MAJOR ARMC (MISCELLANEOUS) ×1 IMPLANT
PACK UNIVERSAL (MISCELLANEOUS) ×1 IMPLANT
STAPLER SKIN PROX 35W (STAPLE) ×1 IMPLANT
SUT ETHIBOND NAB CT1 #1 30IN (SUTURE) ×3 IMPLANT
SUT SILK 2 0 SH CR/8 (SUTURE) IMPLANT
SUT SILK 2-0 18XBRD TIE 12 (SUTURE) ×1 IMPLANT
SUT SILK 3-0 18XBRD TIE 12 (SUTURE) IMPLANT
TOWEL OR 17X26 4PK STRL BLUE (TOWEL DISPOSABLE) ×2 IMPLANT

## 2023-06-15 LAB — CULTURE, BLOOD (ROUTINE X 2)
Culture: NO GROWTH
Culture: NO GROWTH

## 2023-06-15 LAB — CYTOLOGY - PAP
Adequacy: ABSENT
Comment: NEGATIVE
Comment: NEGATIVE
Comment: NEGATIVE
Diagnosis: NEGATIVE
Diagnosis: REACTIVE
HPV 16: POSITIVE — AB
HPV 18 / 45: NEGATIVE
High risk HPV: POSITIVE — AB

## 2023-06-15 LAB — URINE CULTURE: Culture: NO GROWTH

## 2023-06-16 LAB — CULTURE, RESPIRATORY W GRAM STAIN
Culture: NO GROWTH
Special Requests: NORMAL

## 2023-06-18 LAB — CULTURE, BLOOD (ROUTINE X 2)
Culture: NO GROWTH
Culture: NO GROWTH

## 2023-07-10 NOTE — Progress Notes (Signed)
 At OR bedside for administration of Heparin 3 min. Prior to extubation.  This nurse and Jamesetta Geralds RN at bedside at time of death of  28-Jun-1700 to declare no pulse and no spontaneous respirations.    Six family members at bedside during withdrawal of ventilator and declaration of death

## 2023-07-10 NOTE — Progress Notes (Signed)
Pt. Extubated to room air. 

## 2023-07-10 NOTE — Death Summary Note (Signed)
 DEATH SUMMARY   Patient Details  Name: Katie Stanley MRN: [id number] DOB: 08/13/1985  Admission/Discharge Information   Admit Date:  06-19-23  Date of Death: Date of Death: 06/23/2023  Time of Death: Time of Death: 26-Jun-1700  Length of Stay: 4  Referring Physician: Dorcas Carrow, DO   Reason(s) for Hospitalization  Cardiac Arrest   Diagnoses  Preliminary cause of death: Cardiac arrest [facility]) Secondary Diagnoses (including complications and co-morbidities):  Principal Problem:   Acute hypoxic respiratory failure (HCC) Active Problems:   AKI (acute kidney injury) (HCC)   Cardiac arrest (HCC)   Lactic acidosis   Hypokalemia   Cardiogenic shock (HCC)   Anoxic brain injury (HCC)   Perceptual disturbances and seizures concurrent with and due to alcohol withdrawal (HCC)   Cirrhosis of liver without ascites (HCC)   Cardiomyopathy    NSTEMI   V.Fib Cardiac Arrest    Aspiration Pneumonia    Acute Hypoxic Respiratory Failure    Type II Diabetes Mellitus   Brief Hospital Course (including significant findings, care, treatment, and services provided and events leading to death)  Katie Stanley is a 38 y.o. year old female PMHx significant for ETOH abuse, ETOH withdrawal seizures and alcoholic liver cirrhosis admitted to ICU mechanically intubated on Jun 19, 2023 following out-of-hospital V. Fib Cardiac Arrest (approximately 25 minutes of ACLS prior to ROSC), NSTEMI, multifactorial shock, suspected aspiration pneumonia, AKI, and severe myoclonus with anoxic brain injury. See detailed hospital course below under significant events.    Significant Events:   06/19/23: Admitted to ICU with hypotension (+/- septic and/or hypovolemic shock) acute encephalopathy following seizure-activity suspected secondary to ETOH withdrawal requiring mechanical intubation  03/03: Overnight with severe myoclonus requiring increasing Versed gtt to 20 mg/hr, will attempt to transition to Propofol gtt.  Critically ill  with multiorgan failure.  Neurology following, plan for repeat EEG and MRI Brain today.  Cardiology consulted for NSTEMI.  MRI concerning for anoxic brain injury. 03/04: No significant events noted overnight.  Myoclonus resolved with addition of propofol. Neuro exam remains poor and unchanged off Propofol.  Repeat EEG  03/05: Pt remains mechanically intubated.  Per neurology chances of meaningful neurological recovery are grim, therefore encouraging goals of care conversations towards comfort measures.  Pts mother to arrive at bedside 1300 will discuss goals of care.  Pts family decided to proceed with organ donation  06-23-23: Pt transported to the OR for organ donation. Pt terminally extubated on Jun 23, 2023 in the OR, and expired on 23-Jun-2023 at 1702.    Pertinent Labs and Studies  Significant Diagnostic Studies ECHOCARDIOGRAM COMPLETE Result Date: 23-Jun-2023    ECHOCARDIOGRAM REPORT   Patient Name:   Katie Stanley Date of Exam: 06/13/2023 Medical Rec #:  [id number]        Height:       63.0 in Accession #:    [id number]       Weight:       180.3 lb Date of Birth:  02-Dec-1985         BSA:          1.850 m Patient Age:    38 years         BP:           111/73 mmHg Patient Gender: F                HR:           131 bpm. Exam Location:  [facility] Procedure: 2D Echo, Cardiac Doppler  and Color Doppler (Both Spectral and Color            Flow Doppler were utilized during procedure). Indications:     ORGAN DONATION  History:         Patient has prior history of Echocardiogram examinations, most                  recent 06/12/2023.  Sonographer:     Daphine Deutscher RDCS Referring Phys:  [id number] Ezequiel Essex Diagnosing Phys: Julien Nordmann MD IMPRESSIONS  1. Left ventricular ejection fraction, by estimation, is 40 to 45%. The left ventricle has mildly decreased function. The left ventricle demonstrates global hypokinesis. There is moderate left ventricular hypertrophy. Left ventricular diastolic parameters are  indeterminate.  2. Right ventricular systolic function is normal. The right ventricular size is normal. Tricuspid regurgitation signal is inadequate for assessing PA pressure.  3. The mitral valve is normal in structure. Mild mitral valve regurgitation. No evidence of mitral stenosis.  4. The aortic valve has an indeterminant number of cusps. Aortic valve regurgitation is not visualized. No aortic stenosis is present.  5. The inferior vena cava is normal in size with greater than 50% respiratory variability, suggesting right atrial pressure of 3 mmHg. FINDINGS  Left Ventricle: Left ventricular ejection fraction, by estimation, is 40 to 45%. The left ventricle has mildly decreased function. The left ventricle demonstrates global hypokinesis. Strain was performed and the global longitudinal strain is indeterminate. The left ventricular internal cavity size was normal in size. There is moderate left ventricular hypertrophy. Left ventricular diastolic parameters are indeterminate. Right Ventricle: The right ventricular size is normal. No increase in right ventricular wall thickness. Right ventricular systolic function is normal. Tricuspid regurgitation signal is inadequate for assessing PA pressure. Left Atrium: Left atrial size was normal in size. Right Atrium: Right atrial size was normal in size. Pericardium: There is no evidence of pericardial effusion. Mitral Valve: The mitral valve is normal in structure. Mild mitral valve regurgitation. No evidence of mitral valve stenosis. Tricuspid Valve: The tricuspid valve is normal in structure. Tricuspid valve regurgitation is mild . No evidence of tricuspid stenosis. Aortic Valve: The aortic valve has an indeterminant number of cusps. Aortic valve regurgitation is not visualized. No aortic stenosis is present. Pulmonic Valve: The pulmonic valve was normal in structure. Pulmonic valve regurgitation is not visualized. No evidence of pulmonic stenosis. Aorta: The aortic root  is normal in size and structure. Venous: The inferior vena cava is normal in size with greater than 50% respiratory variability, suggesting right atrial pressure of 3 mmHg. IAS/Shunts: No atrial level shunt detected by color flow Doppler. Additional Comments: 3D was performed not requiring image post processing on an independent workstation and was indeterminate.  LEFT VENTRICLE PLAX 2D LVIDd:         4.50 cm   Diastology LVIDs:         3.00 cm   LV e' medial:    11.57 cm/s LV PW:         1.30 cm   LV E/e' medial:  11.4 LV IVS:        1.30 cm   LV e' lateral:   13.35 cm/s LVOT diam:     1.80 cm   LV E/e' lateral: 9.9 LV SV:         44 LV SV Index:   24 LVOT Area:     2.54 cm  RIGHT VENTRICLE  IVC RV Basal diam:  3.30 cm     IVC diam: 1.60 cm RV S prime:     20.40 cm/s TAPSE (M-mode): 1.6 cm LEFT ATRIUM             Index        RIGHT ATRIUM          Index LA diam:        4.00 cm 2.16 cm/m   RA Area:     9.08 cm LA Vol (A2C):   33.6 ml 18.16 ml/m  RA Volume:   18.10 ml 9.78 ml/m LA Vol (A4C):   32.6 ml 17.62 ml/m LA Biplane Vol: 34.1 ml 18.43 ml/m  AORTIC VALVE LVOT Vmax:   131.67 cm/s LVOT Vmean:  89.400 cm/s LVOT VTI:    0.172 m  AORTA Ao Root diam: 3.20 cm Ao Asc diam:  2.80 cm MITRAL VALVE MV Area (PHT): 9.85 cm     SHUNTS MV Decel Time: 77 msec      Systemic VTI:  0.17 m MV E velocity: 132.00 cm/s  Systemic Diam: 1.80 cm MV A velocity: 93.80 cm/s MV E/A ratio:  1.41 Julien Nordmann MD Electronically signed by Julien Nordmann MD Signature Date/Time: 06/11/2023/10:34:13 AM    Final    DG Chest Port 1 View Result Date: 06/25/2023 CLINICAL DATA:  Ventilator dependence. EXAM: PORTABLE CHEST 1 VIEW COMPARISON:  06/13/2023 FINDINGS: Low lung volumes. The cardio pericardial silhouette is enlarged. Endotracheal tube tip is 2.9 cm above the base of the carina. NG tube tip is in the mid stomach. Esophageal temperature probe evident. Left IJ central venous catheter tip overlies the mid SVC level. No  pneumothorax or substantial pleural effusion. Streaky opacity in the right mid lung and left base suggest atelectasis. Telemetry leads overlie the chest. IMPRESSION: 1. Low lung volumes with streaky opacity in the right mid lung and left base suggesting atelectasis. 2. Support apparatus as described. Electronically Signed   By: Kennith Center M.D.   On: 07/08/2023 06:38   CT CHEST ABDOMEN PELVIS WO CONTRAST Result Date: 06/13/2023 CLINICAL DATA:  Organ donation EXAM: CT CHEST, ABDOMEN AND PELVIS WITHOUT CONTRAST TECHNIQUE: Multidetector CT imaging of the chest, abdomen and pelvis was performed following the standard protocol without IV contrast. RADIATION DOSE REDUCTION: This exam was performed according to the departmental dose-optimization program which includes automated exposure control, adjustment of the mA and/or kV according to patient size and/or use of iterative reconstruction technique. COMPARISON:  None Available. FINDINGS: CT CHEST FINDINGS Cardiovascular: Left neck vascular catheter. Normal heart size. No pericardial effusion. Mediastinum/Nodes: No enlarged mediastinal, hilar, or axillary lymph nodes. Thyroid gland, trachea, and esophagus demonstrate no significant findings. Lungs/Pleura: Endotracheal intubation, tube tip in the midtrachea. Extensive bibasilar atelectasis or consolidation. Mosaic ground-glass airspace opacity throughout the right upper lobe (series 4, image 58). No pleural effusion or pneumothorax. Musculoskeletal: No chest wall abnormality. No acute osseous findings. Minimally displaced acute fractures of the anterior right fourth through sixth ribs as well as the anterior left fifth and sixth ribs. CT ABDOMEN PELVIS FINDINGS Hepatobiliary: Coarse contour of the liver. Hepatomegaly, maximum coronal span 21.5 cm. Hepatic steatosis. Status post cholecystectomy. No biliary ductal dilatation. Pancreas: Unremarkable. No pancreatic ductal dilatation or surrounding inflammatory changes.  Spleen: Normal in size without significant abnormality. Adrenals/Urinary Tract: Adrenal glands are unremarkable. Kidneys are normal, without renal calculi, solid lesion, or hydronephrosis. Foley catheter in the bladder. Stomach/Bowel: Stomach is within normal limits. Appendix appears normal. No evidence of bowel  wall thickening, distention, or inflammatory changes. Colon is fluid-filled. Rectal tube. Vascular/Lymphatic: No significant vascular findings are present. No enlarged abdominal or pelvic lymph nodes. Reproductive: No mass or other abnormality. IUD present in the fundal endometrial cavity. Other: No abdominal wall hernia. Anasarca. Small volume perihepatic ascites. Retroperitoneal scarring, likely chronic sequelae of prior pancreatitis (series 2, image 72). Musculoskeletal: No acute osseous findings. IMPRESSION: 1. Extensive bibasilar atelectasis or consolidation. Mosaic ground-glass airspace opacity throughout the right upper lobe. Findings are consistent with infection or aspiration. 2. Minimally displaced acute fractures of the anterior right fourth through sixth ribs as well as the anterior left fifth and sixth ribs, presumably secondary to CPR. No pneumothorax. 3. Cirrhosis, hepatomegaly, and hepatic steatosis. 4. Anasarca and small volume perihepatic ascites. 5. Colon is fluid-filled, suggestive of diarrheal illness. 6. Status post cholecystectomy. Electronically Signed   By: Jearld Lesch M.D.   On: 06/13/2023 22:17   DG Chest Port 1 View Result Date: 06/13/2023 CLINICAL DATA:  Organ donation EXAM: PORTABLE CHEST 1 VIEW COMPARISON:  06/12/2023, 05/13/2023 FINDINGS: Endotracheal tube tip is about 4.5 cm superior to carina. Enteric tube tip below the diaphragm but incompletely visualized. Left IJ central venous catheter tip over the SVC. Esophageal probe tip over the distal mediastinum. Low lung volumes. Borderline to mild cardiomegaly. Partial atelectasis or infection at the medial left base.  IMPRESSION: 1. Support lines and tubes as above 2. Partial atelectasis or infection at the left base 3. Borderline to mild cardiomegaly Electronically Signed   By: Jasmine Pang M.D.   On: 06/13/2023 20:21   ECHOCARDIOGRAM COMPLETE Result Date: 06/12/2023    ECHOCARDIOGRAM REPORT   Patient Name:   Katie Stanley Date of Exam: 06/12/2023 Medical Rec #:  [id number]        Height:       63.0 in Accession #:    [id number]       Weight:       156.7 lb Date of Birth:  1985-06-27         BSA:          1.743 m Patient Age:    38 years         BP:           98/69 mmHg Patient Gender: F                HR:           124 bpm. Exam Location:  [facility] Procedure: 2D Echo, Cardiac Doppler and Color Doppler (Both Spectral and Color            Flow Doppler were utilized during procedure). Indications:     cardiac arrest I46.9  History:         Patient has prior history of Echocardiogram examinations, most                  recent 10/28/2022. Anxiety, LVH, palpitations.  Sonographer:     Cristela Blue Referring Phys:  [id number] ARIDA Diagnosing Phys: Yvonne Kendall MD  Sonographer Comments: Echo performed with patient supine and on artificial respirator. IMPRESSIONS  1. Left ventricular ejection fraction, by estimation, is 25 to 30%. The left ventricle has severely decreased function. The left ventricle demonstrates global hypokinesis. There is mild left ventricular hypertrophy. Indeterminate diastolic filling due to E-A fusion.  2. Right ventricular systolic function is normal. The right ventricular size is normal. Tricuspid regurgitation signal is inadequate for assessing PA pressure.  3. The mitral valve  is normal in structure. Trivial mitral valve regurgitation.  4. The aortic valve has an indeterminant number of cusps. Aortic valve regurgitation is not visualized. No aortic stenosis is present. FINDINGS  Left Ventricle: Left ventricular ejection fraction, by estimation, is 25 to 30%. The left ventricle has severely decreased  function. The left ventricle demonstrates global hypokinesis. The left ventricular internal cavity size was normal in size. There is mild left ventricular hypertrophy. Indeterminate diastolic filling due to E-A fusion. Right Ventricle: The right ventricular size is normal. No increase in right ventricular wall thickness. Right ventricular systolic function is normal. Tricuspid regurgitation signal is inadequate for assessing PA pressure. Left Atrium: Left atrial size was normal in size. Right Atrium: Right atrial size was normal in size. Pericardium: The pericardium was not well visualized. Mitral Valve: The mitral valve is normal in structure. Trivial mitral valve regurgitation. Tricuspid Valve: The tricuspid valve is not well visualized. Tricuspid valve regurgitation is trivial. Aortic Valve: The aortic valve has an indeterminant number of cusps. Aortic valve regurgitation is not visualized. No aortic stenosis is present. Aortic valve mean gradient measures 3.0 mmHg. Aortic valve peak gradient measures 5.2 mmHg. Aortic valve area, by VTI measures 3.37 cm. Pulmonic Valve: The pulmonic valve was not well visualized. Pulmonic valve regurgitation is not visualized. No evidence of pulmonic stenosis. Aorta: The aortic root is normal in size and structure. Pulmonary Artery: The pulmonary artery is not well seen. IAS/Shunts: The interatrial septum was not well visualized.  LEFT VENTRICLE PLAX 2D LVIDd:         4.10 cm      Diastology LVIDs:         3.20 cm      LV e' medial:    4.79 cm/s LV PW:         1.16 cm      LV E/e' medial:  20.7 LV IVS:        1.10 cm      LV e' lateral:   5.11 cm/s LVOT diam:     2.00 cm      LV E/e' lateral: 19.4 LV SV:         47 LV SV Index:   27 LVOT Area:     3.14 cm  LV Volumes (MOD) LV vol d, MOD A2C: 96.0 ml LV vol d, MOD A4C: 104.0 ml LV vol s, MOD A2C: 67.6 ml LV vol s, MOD A4C: 87.9 ml LV SV MOD A2C:     28.4 ml LV SV MOD A4C:     104.0 ml LV SV MOD BP:      18.5 ml RIGHT VENTRICLE  RV Basal diam:  4.10 cm RV Mid diam:    3.10 cm LEFT ATRIUM             Index        RIGHT ATRIUM           Index LA diam:        3.50 cm 2.01 cm/m   RA Area:     16.50 cm LA Vol (A2C):   36.0 ml 20.65 ml/m  RA Volume:   43.60 ml  25.01 ml/m LA Vol (A4C):   52.2 ml 29.94 ml/m LA Biplane Vol: 45.4 ml 26.04 ml/m  AORTIC VALVE AV Area (Vmax):    3.22 cm AV Area (Vmean):   3.18 cm AV Area (VTI):     3.37 cm AV Vmax:           114.00  cm/s AV Vmean:          79.300 cm/s AV VTI:            0.140 m AV Peak Grad:      5.2 mmHg AV Mean Grad:      3.0 mmHg LVOT Vmax:         117.00 cm/s LVOT Vmean:        80.300 cm/s LVOT VTI:          0.150 m LVOT/AV VTI ratio: 1.07  AORTA Ao Root diam: 2.90 cm MITRAL VALVE MV Area (PHT): 6.54 cm    SHUNTS MV Decel Time: 116 msec    Systemic VTI:  0.15 m MV E velocity: 99.00 cm/s  Systemic Diam: 2.00 cm Yvonne Kendall MD Electronically signed by Yvonne Kendall MD Signature Date/Time: 06/12/2023/1:03:56 PM    Final    EEG adult Result Date: 06/12/2023 Charlsie Quest, MD     06/12/2023 12:19 PM Patient Name: Stephinie Battisti Rodenberg MRN: [id number] Epilepsy Attending: Charlsie Quest Referring Physician/Provider: Milon Dikes, MD Date: 06/12/2023 Duration: 28.57 mins  Patient history: 38yo F s/p cardiac arrest. Rapid EEG to evaluate for seizure  Level of alertness:  comatose  AEDs during EEG study: Propofol, Keppra  Technical aspects: This EEG study was done with scalp electrodes positioned according to the 10-20 International system of electrode placement. Electrical activity was reviewed with band pass filter of 1-70Hz , sensitivity of 7 uV/mm, display speed of 47mm/sec with a 60Hz  notched filter applied as appropriate. EEG data were recorded continuously and digitally stored.  Video monitoring was available and reviewed as appropriate.  Description: EEG showed continuous polymorphic mixed frequencies with predominantly 5 to 7 Hz theta slowing admixed with intermittent generalized 2 to 3  Hz delta slowing.  Hyperventilation and photic stimulation were not performed.    ABNORMALITY - Continuous slow, generalized  IMPRESSION: This study is suggestive of moderate to severe diffuse encephalopathy.  No seizures or definite epileptiform discharges were noted during the study. Charlsie Quest   DG Chest Port 1 View Result Date: 06/12/2023 CLINICAL DATA:  Acute respiratory failure. EXAM: PORTABLE CHEST 1 VIEW COMPARISON:  06/10/2023 FINDINGS: Low volume film. Streaky density at the left base is compatible with atelectasis. Possible tiny layering bilateral pleural effusions. Endotracheal tube tip is 4.6 cm above the base of the carina. NG tube tip is positioned in the mid stomach. Esophageal temperature probe noted lower esophagus. Left IJ central line tip overlies the mid to low SVC level. Telemetry leads overlie the chest. IMPRESSION: 1. Low volume film with left base atelectasis and possible tiny layering bilateral pleural effusions. 2. Support apparatus as described. Electronically Signed   By: Kennith Center M.D.   On: 06/12/2023 07:28   MR BRAIN WO CONTRAST Result Date: 06/11/2023 CLINICAL DATA:  Seizure and cardiac arrest EXAM: MRI HEAD WITHOUT CONTRAST TECHNIQUE: Multiplanar, multiecho pulse sequences of the brain and surrounding structures were obtained without intravenous contrast. COMPARISON:  None Available. FINDINGS: Brain: There is multifocal abnormal cortical diffusion restriction, worst in the occipital lobes and the perirolandic gyri. There is also abnormal diffusion restriction of the deep gray nuclei. The pattern is consistent with anoxic brain injury. No acute hemorrhage. No midline shift or other mass effect. Vascular: Normal flow voids. Skull and upper cervical spine: Small right parietal scalp hematoma. Normal marrow signal. Sinuses/Orbits: Normal orbits. Mucosal thickening in the right maxillary sinus. Trace mastoid fluid. Other: Intubated IMPRESSION: 1. Multifocal abnormal  cortical diffusion restriction,  worst in the occipital lobes and the perirolandic gyri. The pattern is consistent with anoxic brain injury. 2. Small right parietal scalp hematoma. Electronically Signed   By: Deatra Robinson M.D.   On: 06/11/2023 17:16   EEG adult Result Date: 06/11/2023 Charlsie Quest, MD     06/11/2023 11:44 AM Patient Name: Katie Stanley MRN: [id number] Epilepsy Attending: Charlsie Quest Referring Physician/Provider: Ezequiel Essex, NP Date: 06/11/2023 Duration: 30.56 mins Patient history: 38yo F s/p cardiac arrest. Rapid EEG to evaluate for seizure Level of alertness:  comatose AEDs during EEG study: Versed, propofol, Keppra Technical aspects: This EEG study was done with scalp electrodes positioned according to the 10-20 International system of electrode placement. Electrical activity was reviewed with band pass filter of 1-70Hz , sensitivity of 7 uV/mm, display speed of 59mm/sec with a 60Hz  notched filter applied as appropriate. EEG data were recorded continuously and digitally stored.  Video monitoring was available and reviewed as appropriate. Description: EEG showed continuous generalized 3 to 6 Hz theta-delta slowing.  Periodic epileptiform discharges were noted in bilateral posterior quadrant at 2.5 to 3 Hz without definite evolution.  Hyperventilation and photic stimulation were not performed.   ABNORMALITY - Periodic epileptiform discharges, bilateral posterior quadrant - Continuous slow, generalized IMPRESSION: This study showed evidence of epileptogenicity arising from bilateral posterior quadrant. Due to the frequency of 2.5 to 3 Hz, this EEG pattern is highly suspicious for ictal nature. Additionally there was severe diffuse encephalopathy.  Dr. Wilford Corner was notified. Priyanka Annabelle Harman   Rapid EEG Result Date: 06/11/2023 Charlsie Quest, MD     06/11/2023 10:56 AM Patient Name: Katie Stanley MRN: [id number] Epilepsy Attending: Charlsie Quest Referring Physician/Provider: Jimmye Norman, NP Duration: 06/10/2023 1958 to 06/11/2023 1003 Patient history: 38yo F s/p cardiac arrest. Rapid EEG to evaluate for seizure Level of alertness:  comatose AEDs during EEG study: Versed, LEV Technical aspects: This EEG was obtained using a 10 lead EEG system positioned circumferentially without any parasagittal coverage (rapid EEG). Computer selected EEG is reviewed as  well as background features and all clinically significant events. Description: At the beginning of the study, EEG showed burst suppression pattern with highly epileptiform bursts lasting 1 to 2 seconds alternating with 10 to 20 seconds of generalized EEG suppression.  Gradually the duration of interburst interval became shorter and bursts were noted every 3 to 7 seconds, lasting for 0.5 to 12 seconds.  ICU team was notified. Gradually as medications were adjusted, after around 2330 on 06/10/2023, EEG improved and showed near continuous generalized low amplitude 3 to 6 Hz theta-delta slowing with overriding 13 to 15 Hz beta activity.  Hyperventilation and photic stimulation were not performed.   ABNORMALITY -Burst suppression with highly epileptiform bursts, generalized -Continuous slow, generalized IMPRESSION: This study initially showed evidence of epileptogenicity with generalized onset.  Given the duration and morphology of epileptiform bursts, this pattern was most likely ictal in nature.  ICU team was notified and medications were adjusted.  Gradually after around 2330 on 06/10/2023, EEG improved and was suggestive of  severe diffuse encephalopathy, likely related to sedation. Please note that this is a limited montage ceribell EEG without video.  Therefore if concern for ictal-interictal activity persist, consider conventional EEG with video for further evaluation. Charlsie Quest   DG Chest 1 View Result Date: 06/10/2023 CLINICAL DATA:  Status post central line placement. EXAM: CHEST  1 VIEW COMPARISON:  Chest radiograph  performed the same day. FINDINGS: A  left internal jugular central venous catheter tip overlies the superior vena cava. An endotracheal tube terminates in the midthoracic trachea. An enteric tube enters the stomach and terminates below the field of view. The heart size is normal. There is mild left basilar atelectasis. The right lung is clear. There is no pneumothorax on either side. IMPRESSION: Left internal jugular central venous catheter tip overlies the superior vena cava. No pneumothorax. Electronically Signed   By: Romona Curls M.D.   On: 06/10/2023 17:02   CT ABDOMEN PELVIS WO CONTRAST Result Date: 06/10/2023 CLINICAL DATA:  Sepsis. Seizure-like activity with subsequent loss of consciousness. EXAM: CT ABDOMEN AND PELVIS WITHOUT CONTRAST TECHNIQUE: Multidetector CT imaging of the abdomen and pelvis was performed following the standard protocol without IV contrast. RADIATION DOSE REDUCTION: This exam was performed according to the departmental dose-optimization program which includes automated exposure control, adjustment of the mA and/or kV according to patient size and/or use of iterative reconstruction technique. COMPARISON:  04/09/2023 FINDINGS: Lower chest: Left lower lobe is collapsed. Heart size within normal limits. Hepatobiliary: Diffuse hepatic steatosis. No focal hepatic lesion. Nodularity of left hepatic lobe suspicious for cirrhosis. Status post cholecystectomy. No bile duct dilatation. Pancreas: Unremarkable. No pancreatic ductal dilatation or surrounding inflammatory changes. Spleen: Normal in size without focal abnormality. Adrenals/Urinary Tract: No significant abnormality of the adrenal glands, kidneys, and ureters. Bladder is collapsed around Foley catheter balloon. Stomach/Bowel: Nasogastric tube terminates within the gastric body. Stomach and duodenum are unremarkable. No bowel dilatation to indicate ileus or obstruction. Evaluation of the bowel is limited due to motion. Mild nonspecific  fat stranding seen in the right paracolic gutter. No significant thickening of the wall of the ascending colon to definitively indicate inflammation. Appendix is normal. Vascular/Lymphatic: No significant vascular findings are present. No enlarged abdominal or pelvic lymph nodes. Reproductive: IUD noted within the uterus.  No adnexal abnormality. Other: No abdominal wall hernia or abnormality. No abdominopelvic ascites. Musculoskeletal: No acute or significant osseous findings. IMPRESSION: 1. Left lower lobe collapse. 2. Diffuse hepatic steatosis. Nodularity of the left hepatic lobe suspicious for cirrhosis. Electronically Signed   By: Acquanetta Belling M.D.   On: 06/10/2023 15:38   CT HEAD WO CONTRAST ( ) Result Date: 06/10/2023 CLINICAL DATA:  Unresponsive at home after seizure.  Arrest. EXAM: CT HEAD WITHOUT CONTRAST CT CERVICAL SPINE WITHOUT CONTRAST TECHNIQUE: Multidetector CT imaging of the head and cervical spine was performed following the standard protocol without intravenous contrast. Multiplanar CT image reconstructions of the cervical spine were also generated. RADIATION DOSE REDUCTION: This exam was performed according to the departmental dose-optimization program which includes automated exposure control, adjustment of the mA and/or kV according to patient size and/or use of iterative reconstruction technique. COMPARISON:  Head CT 06/22/2022 FINDINGS: CT HEAD FINDINGS Brain: No evidence of acute infarction, hemorrhage, hydrocephalus, extra-axial collection or mass lesion/mass effect. Dilated perivascular space below the right putamen. Vascular: No hyperdense vessel or unexpected calcification. Skull: Normal. Negative for fracture or focal lesion. Sinuses/Orbits: Nasopharyngeal fluid in the setting of intubation. Symmetric proptosis from increase in retro-orbital fat, also seen on prior. CT CERVICAL SPINE FINDINGS Alignment: Normal. Skull base and vertebrae: No acute fracture. No primary bone lesion or  focal pathologic process. Soft tissues and spinal canal: No prevertebral fluid or swelling. No visible canal hematoma. Calcification at lower right internal jugular vein, likely fibrin sheath from old access. Unremarkable hardware were traversing the neck. Disc levels:  No degenerative changes or visible impingement. Upper chest: Band of atelectasis in  the left upper lobe. IMPRESSION: No acute intracranial finding.  No visible anoxic injury. Negative cervical spine CT. Electronically Signed   By: Tiburcio Pea M.D.   On: 06/10/2023 12:47   CT Cervical Spine Wo Contrast Result Date: 06/10/2023 CLINICAL DATA:  Unresponsive at home after seizure.  Arrest. EXAM: CT HEAD WITHOUT CONTRAST CT CERVICAL SPINE WITHOUT CONTRAST TECHNIQUE: Multidetector CT imaging of the head and cervical spine was performed following the standard protocol without intravenous contrast. Multiplanar CT image reconstructions of the cervical spine were also generated. RADIATION DOSE REDUCTION: This exam was performed according to the departmental dose-optimization program which includes automated exposure control, adjustment of the mA and/or kV according to patient size and/or use of iterative reconstruction technique. COMPARISON:  Head CT 06/22/2022 FINDINGS: CT HEAD FINDINGS Brain: No evidence of acute infarction, hemorrhage, hydrocephalus, extra-axial collection or mass lesion/mass effect. Dilated perivascular space below the right putamen. Vascular: No hyperdense vessel or unexpected calcification. Skull: Normal. Negative for fracture or focal lesion. Sinuses/Orbits: Nasopharyngeal fluid in the setting of intubation. Symmetric proptosis from increase in retro-orbital fat, also seen on prior. CT CERVICAL SPINE FINDINGS Alignment: Normal. Skull base and vertebrae: No acute fracture. No primary bone lesion or focal pathologic process. Soft tissues and spinal canal: No prevertebral fluid or swelling. No visible canal hematoma. Calcification at  lower right internal jugular vein, likely fibrin sheath from old access. Unremarkable hardware were traversing the neck. Disc levels:  No degenerative changes or visible impingement. Upper chest: Band of atelectasis in the left upper lobe. IMPRESSION: No acute intracranial finding.  No visible anoxic injury. Negative cervical spine CT. Electronically Signed   By: Tiburcio Pea M.D.   On: 06/10/2023 12:47   DG Chest Port 1 View Result Date: 06/10/2023 CLINICAL DATA:  Cardiac arrest, tube placement EXAM: PORTABLE CHEST 1 VIEW COMPARISON:  04/14/2023 FINDINGS: Endotracheal tube is positioned with tip in the midtrachea. Esophagogastric tube with tip and side port below the diaphragm. Evaluation of the heart and lungs very limited by overlying compression board; no obvious abnormality. No acute osseous findings. IMPRESSION: 1. Endotracheal tube is positioned with tip in the midtrachea. 2. Esophagogastric tube with tip and side port below the diaphragm. 3. Evaluation of the heart and lungs very limited by overlying compression board; no obvious abnormality. Electronically Signed   By: Jearld Lesch M.D.   On: 06/10/2023 12:10    Microbiology Recent Results (from the past 240 hours)  Resp panel by RT-PCR (RSV, Flu A&B, Covid) Anterior Nasal Swab     Status: None   Collection Time: 06/10/23  1:12 PM   Specimen: Anterior Nasal Swab  Result Value Ref Range Status   SARS Coronavirus 2 by RT PCR NEGATIVE NEGATIVE Final    Comment: (NOTE) SARS-CoV-2 target nucleic acids are NOT DETECTED.  The SARS-CoV-2 RNA is generally detectable in upper respiratory specimens during the acute phase of infection. The lowest concentration of SARS-CoV-2 viral copies this assay can detect is 138 copies/mL. A negative result does not preclude SARS-Cov-2 infection and should not be used as the sole basis for treatment or other patient management decisions. A negative result may occur with  improper specimen  collection/handling, submission of specimen other than nasopharyngeal swab, presence of viral mutation(s) within the areas targeted by this assay, and inadequate number of viral copies(<138 copies/mL). A negative result must be combined with clinical observations, patient history, and epidemiological information. The expected result is Negative.  Fact Sheet for Patients:  BloggerCourse.com  Fact Sheet  for Healthcare Providers:  SeriousBroker.it  This test is no t yet approved or cleared by the [place] and  has been authorized for detection and/or diagnosis of SARS-CoV-2 by FDA under an Emergency Use Authorization (EUA). This EUA will remain  in effect (meaning this test can be used) for the duration of the COVID-19 declaration under Section 564(b)(1) of the Act, 21 U.S.C.section 360bbb-3(b)(1), unless the authorization is terminated  or revoked sooner.       Influenza A by PCR NEGATIVE NEGATIVE Final   Influenza B by PCR NEGATIVE NEGATIVE Final    Comment: (NOTE) The Xpert Xpress SARS-CoV-2/FLU/RSV plus assay is intended as an aid in the diagnosis of influenza from Nasopharyngeal swab specimens and should not be used as a sole basis for treatment. Nasal washings and aspirates are unacceptable for Xpert Xpress SARS-CoV-2/FLU/RSV testing.  Fact Sheet for Patients: BloggerCourse.com  Fact Sheet for Healthcare Providers: SeriousBroker.it  This test is not yet approved or cleared by the [place] FDA and has been authorized for detection and/or diagnosis of SARS-CoV-2 by FDA under an Emergency Use Authorization (EUA). This EUA will remain in effect (meaning this test can be used) for the duration of the COVID-19 declaration under Section 564(b)(1) of the Act, 21 U.S.C. section 360bbb-3(b)(1), unless the authorization is terminated or revoked.     Resp Syncytial  Virus by PCR NEGATIVE NEGATIVE Final    Comment: (NOTE) Fact Sheet for Patients: BloggerCourse.com  Fact Sheet for Healthcare Providers: SeriousBroker.it  This test is not yet approved or cleared by the [place] FDA and has been authorized for detection and/or diagnosis of SARS-CoV-2 by FDA under an Emergency Use Authorization (EUA). This EUA will remain in effect (meaning this test can be used) for the duration of the COVID-19 declaration under Section 564(b)(1) of the Act, 21 U.S.C. section 360bbb-3(b)(1), unless the authorization is terminated or revoked.  Performed at [facility], [address] [place], [place] [address]   Respiratory (~20 pathogens) panel by PCR     Status: None   Collection Time: 06/10/23  1:12 PM   Specimen: Nasopharyngeal Swab; Respiratory  Result Value Ref Range Status   Adenovirus NOT DETECTED NOT DETECTED Final   Coronavirus 229E NOT DETECTED NOT DETECTED Final    Comment: (NOTE) The Coronavirus on the Respiratory Panel, DOES NOT test for the novel  Coronavirus (2019 nCoV)    Coronavirus HKU1 NOT DETECTED NOT DETECTED Final   Coronavirus NL63 NOT DETECTED NOT DETECTED Final   Coronavirus OC43 NOT DETECTED NOT DETECTED Final   Metapneumovirus NOT DETECTED NOT DETECTED Final   Rhinovirus / Enterovirus NOT DETECTED NOT DETECTED Final   Influenza A NOT DETECTED NOT DETECTED Final   Influenza B NOT DETECTED NOT DETECTED Final   Parainfluenza Virus 1 NOT DETECTED NOT DETECTED Final   Parainfluenza Virus 2 NOT DETECTED NOT DETECTED Final   Parainfluenza Virus 3 NOT DETECTED NOT DETECTED Final   Parainfluenza Virus 4 NOT DETECTED NOT DETECTED Final   Respiratory Syncytial Virus NOT DETECTED NOT DETECTED Final   Bordetella pertussis NOT DETECTED NOT DETECTED Final   Bordetella Parapertussis NOT DETECTED NOT DETECTED Final   Chlamydophila pneumoniae NOT DETECTED NOT DETECTED Final    Mycoplasma pneumoniae NOT DETECTED NOT DETECTED Final    Comment: Performed at [facility], [address], [place], [place] [address]  Culture, blood (Routine X 2) w Reflex to ID Panel     Status: None (Preliminary result)   Collection  Time: 06/10/23  1:13 PM   Specimen: BLOOD  Result Value Ref Range Status   Specimen Description BLOOD BLOOD LEFT ARM  Final   Special Requests   Final    BOTTLES DRAWN AEROBIC AND ANAEROBIC Blood Culture results may not be optimal due to an inadequate volume of blood received in culture bottles   Culture   Final    NO GROWTH 4 DAYS Performed at [facility], [address] [place], [place] [address]    Report Status PENDING  Incomplete  Culture, blood (Routine X 2) w Reflex to ID Panel     Status: None (Preliminary result)   Collection Time: 06/10/23  1:18 PM   Specimen: BLOOD  Result Value Ref Range Status   Specimen Description BLOOD BLOOD LEFT HAND  Final   Special Requests   Final    BOTTLES DRAWN AEROBIC AND ANAEROBIC Blood Culture results may not be optimal due to an inadequate volume of blood received in culture bottles   Culture   Final    NO GROWTH 4 DAYS Performed at [facility], [address] [place], [place] [address]    Report Status PENDING  Incomplete  MRSA Next Gen by PCR, Nasal     Status: None   Collection Time: 06/10/23  3:29 PM   Specimen: Nasal Mucosa; Nasal Swab  Result Value Ref Range Status   MRSA by PCR Next Gen NOT DETECTED NOT DETECTED Final    Comment: (NOTE) The GeneXpert MRSA Assay (FDA approved for NASAL specimens only), is one component of a comprehensive MRSA colonization surveillance program. It is not intended to diagnose MRSA infection nor to guide or monitor treatment for MRSA infections. Test performance is not FDA approved in patients less than 20 years old. Performed at [facility], [address], [place], [place] [address]   Urine Culture     Status: None    Collection Time: 06/10/23  8:06 PM   Specimen: Urine, Random  Result Value Ref Range Status   Specimen Description   Final    URINE, RANDOM Performed at [facility], [address], [place], [place] [address]    Special Requests   Final    NONE Reflexed from [id number] Performed at [facility], [address] [place], [place] [address]    Culture   Final    NO GROWTH Performed at [facility], [address], [place], [place] [address]    Report Status 06/12/2023 FINAL  Final  Culture, Respiratory w Gram Stain (tracheal aspirate)     Status: None   Collection Time: 06/10/23  8:34 PM   Specimen: Tracheal Aspirate; Respiratory  Result Value Ref Range Status   Specimen Description   Final    TRACHEAL ASPIRATE Performed at [facility], [address], [place], [place] [address]    Special Requests   Final    NONE Performed at [facility], [address] [place], [place] [address]    Gram Stain   Final    NO WBC SEEN FEW GRAM POSITIVE COCCI IN CLUSTERS FEW GRAM POSITIVE COCCI IN PAIRS FEW GRAM NEGATIVE RODS    Culture   Final    Normal respiratory flora-no Staph aureus or Pseudomonas seen Performed at [facility], [address], [place], [place] [address]    Report Status 06/13/2023 FINAL  Final  MRSA Next Gen by PCR, Nasal     Status: None   Collection  Time: 06/13/23 11:14 AM   Specimen: Nasal Mucosa; Nasal Swab  Result Value Ref Range Status   MRSA by PCR Next Gen NOT DETECTED NOT DETECTED Final    Comment: (NOTE) The GeneXpert MRSA Assay (FDA approved for NASAL specimens only), is one component of a comprehensive MRSA colonization surveillance program. It is not intended to diagnose MRSA infection nor to guide or monitor treatment for MRSA infections. Test performance is not FDA approved in patients less than 76 years old. Performed at [facility], [address] [place], [place] [address]    Culture, blood (Routine X 2) w Reflex to ID Panel     Status: None (Preliminary result)   Collection Time: 06/13/23  5:18 PM   Specimen: BLOOD RIGHT HAND  Result Value Ref Range Status   Specimen Description BLOOD RIGHT HAND  Final   Special Requests   Final    BOTTLES DRAWN AEROBIC AND ANAEROBIC Blood Culture results may not be optimal due to an inadequate volume of blood received in culture bottles   Culture   Final    NO GROWTH < 24 HOURS Performed at [facility], [address] [place], [place] [address]    Report Status PENDING  Incomplete  Culture, blood (Routine X 2) w Reflex to ID Panel     Status: None (Preliminary result)   Collection Time: 06/13/23  5:20 PM   Specimen: BLOOD LEFT HAND  Result Value Ref Range Status   Specimen Description BLOOD LEFT HAND  Final   Special Requests   Final    AEROBIC BOTTLE ONLY Blood Culture results may not be optimal due to an inadequate volume of blood received in culture bottles   Culture   Final    NO GROWTH < 24 HOURS Performed at [facility], [address] [place], [place] [address]    Report Status PENDING  Incomplete  Culture, Respiratory w Gram Stain     Status: None (Preliminary result)   Collection Time: 06/13/23  6:35 PM   Specimen: Bronchoalveolar Lavage; Respiratory  Result Value Ref Range Status   Specimen Description   Final    BRONCHIAL ALVEOLAR LAVAGE Performed at [facility], [address] [place], [place] [address]    Special Requests   Final    Normal Performed at [facility], [address] [place], [place] [address]    Gram Stain   Final    WBC PRESENT, PREDOMINANTLY PMN NO ORGANISMS SEEN CYTOSPIN SMEAR    Culture   Final    NO GROWTH < 24 HOURS Performed at [facility], [address] [place], [place] [address]    Report Status PENDING  Incomplete  Resp panel by RT-PCR (RSV, Flu A&B, Covid) Anterior Nasal Swab     Status: None   Collection Time:  06/27/2023  5:05 AM   Specimen: Anterior Nasal Swab  Result Value Ref Range Status   SARS Coronavirus 2 by RT PCR NEGATIVE NEGATIVE Final    Comment: (NOTE) SARS-CoV-2 target nucleic acids are NOT DETECTED.  The SARS-CoV-2 RNA is generally detectable in upper respiratory specimens during the acute phase of infection. The lowest concentration of SARS-CoV-2 viral copies this assay can detect is 138 copies/mL. A negative result does not preclude SARS-Cov-2 infection and should not be used as the sole basis for treatment or other patient management decisions. A negative result may occur with  improper specimen collection/handling, submission of specimen other than nasopharyngeal swab, presence  of viral mutation(s) within the areas targeted by this assay, and inadequate number of viral copies(<138 copies/mL). A negative result must be combined with clinical observations, patient history, and epidemiological information. The expected result is Negative.  Fact Sheet for Patients:  BloggerCourse.com  Fact Sheet for Healthcare Providers:  SeriousBroker.it  This test is no t yet approved or cleared by the [place] FDA and  has been authorized for detection and/or diagnosis of SARS-CoV-2 by FDA under an Emergency Use Authorization (EUA). This EUA will remain  in effect (meaning this test can be used) for the duration of the COVID-19 declaration under Section 564(b)(1) of the Act, 21 U.S.C.section 360bbb-3(b)(1), unless the authorization is terminated  or revoked sooner.       Influenza A by PCR NEGATIVE NEGATIVE Final   Influenza B by PCR NEGATIVE NEGATIVE Final    Comment: (NOTE) The Xpert Xpress SARS-CoV-2/FLU/RSV plus assay is intended as an aid in the diagnosis of influenza from Nasopharyngeal swab specimens and should not be used as a sole basis for treatment. Nasal washings and aspirates are unacceptable for Xpert Xpress  SARS-CoV-2/FLU/RSV testing.  Fact Sheet for Patients: BloggerCourse.com  Fact Sheet for Healthcare Providers: SeriousBroker.it  This test is not yet approved or cleared by the [place] FDA and has been authorized for detection and/or diagnosis of SARS-CoV-2 by FDA under an Emergency Use Authorization (EUA). This EUA will remain in effect (meaning this test can be used) for the duration of the COVID-19 declaration under Section 564(b)(1) of the Act, 21 U.S.C. section 360bbb-3(b)(1), unless the authorization is terminated or revoked.     Resp Syncytial Virus by PCR NEGATIVE NEGATIVE Final    Comment: (NOTE) Fact Sheet for Patients: BloggerCourse.com  Fact Sheet for Healthcare Providers: SeriousBroker.it  This test is not yet approved or cleared by the [place] FDA and has been authorized for detection and/or diagnosis of SARS-CoV-2 by FDA under an Emergency Use Authorization (EUA). This EUA will remain in effect (meaning this test can be used) for the duration of the COVID-19 declaration under Section 564(b)(1) of the Act, 21 U.S.C. section 360bbb-3(b)(1), unless the authorization is terminated or revoked.  Performed at [facility], [address] [place], [place] [address]     Lab Basic Metabolic Panel: Recent Labs  Lab 06/12/23 0400 06/12/23 1701 06/13/23 0417 06/13/23 1718 06/13/23 2257 06/13/23 2258 06/26/2023 0319 06/27/2023 0944  NA 140   < > 139 140  --  140 138 141  K 3.1*   < > 3.8 4.3  --  4.3 4.2 4.2  CL 100   < > 102 105  --  105 106 106  CO2 27   < > 26 24  --  25 25 25   GLUCOSE 135*   < > 140* 111*  --  125* 130* 102*  BUN 17   < > 22* 24*  --  26* 28* 29*  CREATININE 1.59*   < > 1.51* 1.43*  --  1.39* 1.32* 1.25*  CALCIUM 8.4*   < > 8.6* 8.5*  --  8.2* 8.3* 8.4*  MG 1.8  --  1.8 1.9 2.4  --  2.4  --   PHOS 2.6  --  1.4* 2.2* 3.6   --  3.4  --    < > = values in this interval not displayed.   Liver Function Tests: Recent Labs  Lab 06/13/23 0417 06/13/23 1718 06/13/23 2258 07/06/2023 0319 06/24/2023 0944  AST 137* 171* 140*  137* 135*  ALT 26 31 25 25 25   ALKPHOS 167* 185* 156* 160* 154*  BILITOT 2.8* 2.6* 2.5* 2.6* 2.8*  PROT 6.1* 6.6 5.9* 6.1* 5.9*  ALBUMIN 2.4* 2.6* 2.5* 2.6* 2.5*   Recent Labs  Lab 06/10/23 1344 06/13/23 2258  LIPASE 69* 36  AMYLASE 194* 26*   Recent Labs  Lab 06/10/23 1447 06/13/23 0417  AMMONIA 218* 42*   CBC: Recent Labs  Lab 06/13/23 0417 06/13/23 1718 06/13/23 2258 06/25/2023 0319 07/08/2023 0944  WBC 9.7 9.2 9.1 8.9 7.6  HGB 11.5* 11.9* 10.3* 10.4* 10.0*  HCT 36.1 36.5 31.5* 32.4* 31.2*  MCV 99.7 97.6 99.4 100.9* 100.6*  PLT 135* 144* 133* 128* 128*   Cardiac Enzymes: Recent Labs  Lab 06/10/23 1344 06/13/23 1718 06/13/23 2258 07/01/2023 0319 07/01/2023 0944  CKTOTAL 481* 175 178 175 145   Sepsis Labs: Recent Labs  Lab 06/10/23 1447 06/10/23 1730 06/12/23 1701 06/12/23 1952 06/13/23 0417 06/13/23 1718 06/13/23 2257 06/13/23 2258 06/24/2023 0319 07/08/2023 0944  PROCALCITON 0.78  --   --   --   --   --   --   --   --   --   WBC  --    < >  --   --    < > 9.2  --  9.1 8.9 7.6  LATICACIDVEN  --    < > 3.5* 3.5*  --  2.7* 1.6  --   --   --    < > = values in this interval not displayed.    Procedures/Operations  Left Internal Jugular Central Line Placement  Right Radial Arterial Line Placement  Mechanical Intubation  Organ Procurement Surgery   Zada Girt, [place]  Pulmonary/Critical Care Pager [phone] (please enter 7 digits) PCCM Consult Pager [phone] (please enter 7 digits) a

## 2023-07-10 NOTE — TOC Progression Note (Signed)
 Transition of Care [facility]) - Progression Note    Patient Details  Name: Katie Stanley MRN: [id number] Date of Birth: Feb 09, 1986  [facility] CM/SW Contact  Garret Reddish, RN Phone Number: 06/26/2023, 12:44 PM  Clinical Narrative:    Chart reviewed.  Noted that patient schedule for OR today for organ donation.  Surgery scheduled for 6 pm today.    I have meet with patient's mother at bedside and provided information on grief counseling for patient's 76 year old son.  Provided support to mother and family.    Patient remains on ventilator at this time.  No acute distress noted.           Expected Discharge Plan and Services                                               Social Determinants of Health (SDOH) Interventions SDOH Screenings   Food Insecurity: No Food Insecurity (06/12/2023)  Housing: Unknown (06/12/2023)  Transportation Needs: Patient Unable To Answer (06/12/2023)  Utilities: Patient Unable To Answer (06/12/2023)  Alcohol Screen: Low Risk  (02/26/2023)  Depression (PHQ2-9): Low Risk  (06/07/2023)  Recent Concern: Depression (PHQ2-9) - Medium Risk (05/08/2023)  Financial Resource Strain: Low Risk  (02/26/2023)  Physical Activity: Insufficiently Active (02/26/2023)  Social Connections: Moderately Isolated (04/17/2023)  Stress: No Stress Concern Present (02/26/2023)  Tobacco Use: High Risk (06/11/2023)  Health Literacy: Adequate Health Literacy (02/26/2023)    Readmission Risk Interventions    04/19/2023    8:50 AM  Readmission Risk Prevention Plan  Transportation Screening Complete  Medication Review (RN Care Manager) Complete  PCP or Specialist appointment within 3-5 days of discharge Complete  HRI or Home Care Consult Complete  SW Recovery Care/Counseling Consult Complete  Palliative Care Screening Not Applicable  Skilled Nursing Facility Not Applicable

## 2023-07-10 NOTE — Progress Notes (Signed)
 Patient IV pumps returned from OR. Versed wasted (unable to waste in pyxis) 30 ml with myself and [employer]. Disposed of in the stericycle.   Lafonda Mosses [phone]   Agree with above statement and witnessed waste [employer] [phone]

## 2023-07-10 NOTE — Progress Notes (Signed)
 NAME:  Katie Stanley, MRN:  [id number], DOB:  1985/10/24, LOS: 4 ADMISSION DATE:  06/10/2023, CONSULTATION DATE:  06/10/23 REFERRING MD:  Dr. Vicente Males, CHIEF COMPLAINT:  Cardiac Arrest   Brief Pt Description / Synopsis:  38 y.o. female with PMHx significant for ETOH abuse, ETOH withdrawal seizures and alcoholic liver cirrhosis admitted following out-of-hospital V. Fib Cardiac Arrest (approximately 25 minutes of ACLS prior to ROSC), NSTEMI, multifactorial Shock, suspected aspiration pneumonia, AKI, and severe myoclonus with concern for possible anoxic brain injury.  History of Present Illness:  This is a 37 yo female with a PMH of ETOH abuse, seizure activity secondary to ETOH withdrawal, and alcoholic liver cirrhosis.  She presented to [facility] ER on 03/2 via EMS from home after pts family witnessed the pt having seizure-like activity (staring spell followed by abnormal respirations then tensing up with abnormal respirations followed by "shaking activity" resulting in tongue trauma) and subsequent loss of consciousness.  Per ER notes EMS reported pt v fib arrested requiring epi x4, amiodarone load, 2 amps of bicarb, and defibrillation x1 prior to ROSC after 23 minutes.  Pts significant other reported to EDP the pt has had heavy alcohol abuse over the past week with 1/2 pint of liquor daily.  She attempted to stop over the last few days and initially had seizure activity on 03/1.  Therefore, pt had an alcoholic beverage following the seizure activity in an attempt to prevent recurrent seizure activity.  However, the pt did not have an alcoholic beverage today.     ED Course  Upon arrival to the ER pt severely hypotensive despite IV fluids resuscitation requiring 1 amp of epi followed by levophed gtt.  Due to inability to protect airway pt mechanically intubated for airway protection.  EKG revealed sinus rhythm, hr 78 without signs of ST elevation.  CXR revealed no acute cardiopulmonary abnormality.  Significant  lab results were: K+ 2.5/chloride 88/CO2 19/glucose 243/creatinine 1.62/ anion gap 32/lactic acid >9.0/wbc 17.3.  CT Head/Cervical Spine negative for acute intracranial abnormality.  Pt received 2 grams of iv keppra.    PCCM asked to admit for further workup and treatment.  Please see "Significant Hospital Events" section below for full detailed hospital course.  Pertinent  Medical History   Past Medical History:  Diagnosis Date   Alcohol abuse    Anxiety    Biliary calculi 12/07/2009   Cholelithiasis    Depression    Diet-controlled diabetes mellitus (HCC)    GERD (gastroesophageal reflux disease)    Hepatitis C    body "self healed" per patient   LVH (left ventricular hypertrophy)    a. 10/2022 Echo: EF 60-65%, no rwma, sev conc LVH, nl RV size/fxn, triv MR. Mobile density near ant MV leaflet- felt to be redundant chordae tendinae..   Morbid obesity (HCC)    Palpitations    a. 07/2019 Holter: Sinus rhythm, PVCs, rare PACs.  No significant arrhythmias.   Pancreatic pseudocyst    a. 02/2023 MRI abd: improving unilocular pancreatic pseudocyst.   Retained intrauterine contraceptive device (IUD) 01/20/2020   Substance abuse (HCC)     Micro Data:  03/2: Resp panel by RT>>negative  03/2: Blood x2>> no growth to date 03/2: MRSA PCR>> negative 03/2: Tracheal aspirate>>normal resp flora-no staph or pseudomonas seen  03/2: Urine culture>> no growth  Antimicrobials:   Anti-infectives (From admission, onward)    Start     Dose/Rate Route Frequency Ordered Stop   06/12/23 2200  ceFEPIme (MAXIPIME) 2 g in  sodium chloride 0.9 % 100 mL IVPB        2 g 200 mL/hr over 30 Minutes Intravenous Every 12 hours 06/12/23 1151     06/11/23 1200  vancomycin (VANCOREADY) IVPB 750 mg/150 mL  Status:  Discontinued        750 mg 150 mL/hr over 60 Minutes Intravenous Every 24 hours 06/10/23 1512 06/11/23 1036   06/10/23 1800  metroNIDAZOLE (FLAGYL) IVPB 500 mg  Status:  Discontinued        500  mg 100 mL/hr over 60 Minutes Intravenous Every 12 hours 06/10/23 1506 06/12/23 1037   06/10/23 1700  vancomycin (VANCOREADY) IVPB 1500 mg/300 mL        1,500 mg 150 mL/hr over 120 Minutes Intravenous  Once 06/10/23 1507 06/10/23 2331   06/10/23 1600  cefTAZidime (FORTAZ) 2 g in sodium chloride 0.9 % 100 mL IVPB  Status:  Discontinued        2 g 200 mL/hr over 30 Minutes Intravenous Every 12 hours 06/10/23 1506 06/12/23 1151       Significant Hospital Events: Including procedures, antibiotic start and stop dates in addition to other pertinent events   03/2: Admitted to ICU with hypotension (+/- septic and/or hypovolemic shock) acute encephalopathy following seizure-activity suspected secondary to ETOH withdrawal requiring mechanical intubation  03/03: Overnight with severe myoclonus requiring increasing Versed gtt to 20 mg/hr, will attempt to transition to Propofol gtt.  Critically ill with multiorgan failure.  Neurology following, plan for repeat EEG and MRI Brain today.  Cardiology consulted for NSTEMI.  MRI concerning for anoxic brain injury. 03/04: No significant events noted overnight.  Myoclonus resolved with addition of propofol. Neuro exam remains poor and unchanged off Propofol.  Repeat EEG  03/05: Pt remains mechanically intubated.  Per neurology chances of meaningful neurological recovery are grim, therefore encouraging goals of care conversations towards comfort measures.  Pts mother to arrive at bedside 1300 will discuss goals of care.  Pts family decided to proceed with organ donation  03/6: Pt scheduled to go to the OR at 1600 for possible organ donation   Interim History / Subjective:  As outlined above under "Significant Hospital Events" section  Objective   Blood pressure 101/70, pulse (!) 108, temperature 97.9 F (36.6 C), resp. rate 18, height 5\' 3"  (1.6 m), weight 80 kg, SpO2 99%.    Vent Mode: PRVC FiO2 (%):  [28 %-100 %] 50 % Set Rate:  [18 bmp] 18 bmp Vt Set:   [500 mL] 500 mL PEEP:  [5 cmH20] 5 cmH20   Intake/Output Summary (Last 24 hours) at 07/05/2023 0854 Last data filed at 06/17/2023 0640 Gross per 24 hour  Intake 2491.64 ml  Output 1332 ml  Net 1159.64 ml   Filed Weights   06/12/23 0310 06/13/23 0320 06/16/2023 0709  Weight: 71.1 kg 81.8 kg 80 kg    Examination: General: Acute on chronically-ill appearing female, NAD mechanically intubated and sedated HENT: Supple, no JVD, orally intubated Lungs: Coarse breath sounds throughout, even, non labored, synchronous with vent Cardiovascular: Tachycardic, s1s2, no m/r/g, 2+ radial/1+ distal pulses, 1+ generalized edema  Abdomen: +BS x4, obese, mildly distended  Extremities: Normal bulk and tone, not moving extremities  Neuro: Unresponsive, not following commands or withdrawing from painful stimulation; no corneal/gag/corneal reflexes; she is breathing over the ventilator, pupils mildly asymmetric but reactive GU: Indwelling foley catheter draining dark yellow urine   Resolved Hospital Problem list     Assessment & Plan:   #Out-of-hospital v.  fib cardiac arrest, suspect secondary to electrolyte abnormalities vs seizures #NSTEMI #Multifactorial Shock: Hypovolemic +/- Cardiogenic +/- Septic Echocardiogram 06/12/23:  LVEF 25-30%, mild LVH, indeterminate diastolic parameters, RV systolic function normal, RV size is normal - Continuous cardiac monitoring - Prn levophed gtt to maintain map 65 or higher  - Cardiology following, appreciate input - Received heparin gtt for 48 hrs per cardiology recommendations   #Acute hypoxic respiratory Failure #Suspected aspiration pneumonia - Full vent support, implement lung protective strategies - Plateau pressures less than 30 cm H20 - Wean FiO2 & PEEP as tolerated to maintain O2 sats >92% - Follow intermittent Chest X-ray & ABG as needed - Spontaneous Breathing Trials when respiratory parameters met and mental status permits - Implement VAP Bundle - Prn  Bronchodilators - ABX as above  #Sepsis  #Suspected aspiration pneumonia - Trend WBC and monitor fever curve  - Trend PCT  - Follow cultures as above - Continue abx as outlined above   #Acute kidney injury secondary to ATN~improving   #Hypokalemia~resolved   #Anion gap metabolic acidosis~resolved  #Severe lactic acidosis~improving   - Trend BMP  - Replace electrolytes as indicated ~ Pharmacy following for assistance with electrolyte replacement - Strict I&O's - Ensure adequate renal perfusion - Avoid nephrotoxic agents as able  #Alcoholic cirrhosis  - Trend hepatic function panel and coags - Avoid hepatoxic agents    #Type II diabetes mellitus  - CBG's q4h; Target range of 140 to 180 - SSI - Follow ICU Hypo/Hyperglycemia protocol  #Acute metabolic encephalopathy  #Severe anoxic brain injury  #Mechanical intubation pain/discomfort  Hx: Polysubstance abuse, anxiety, and depression MRI Brain 3/3 concerning for anoxic brain injury - Treatment of metabolic derangements as outlined above - Maintain a RASS goal of 0  - All sedation off and pt remains unresponsive  - Avoid sedating medications as able - Daily wake up assessment - Neurology following, appreciate input - Continue keppra 1g BID  Best Practice (right click and "Reselect all SmartList Selections" daily)   Diet/type: NPO, DVT prophylaxis: subcutaneous heparin  GI prophylaxis: PPI Lines: Central line and yes and it is still needed Foley:  Yes, and it is still needed Code Status:  full code Last date of multidisciplinary goals of care discussion [06/28/2023]  Labs   CBC: Recent Labs  Lab 06/12/23 0400 06/13/23 0417 06/13/23 1718 06/13/23 2258 06/19/2023 0319  WBC 15.6* 9.7 9.2 9.1 8.9  HGB 13.2 11.5* 11.9* 10.3* 10.4*  HCT 40.4 36.1 36.5 31.5* 32.4*  MCV 98.3 99.7 97.6 99.4 100.9*  PLT 179 135* 144* 133* 128*    Basic Metabolic Panel: Recent Labs  Lab 06/12/23 0400 06/12/23 1701 06/13/23 0417  06/13/23 1718 06/13/23 2257 06/13/23 2258 06/30/2023 0319  NA 140 138 139 140  --  140 138  K 3.1* 3.9 3.8 4.3  --  4.3 4.2  CL 100 101 102 105  --  105 106  CO2 27 24 26 24   --  25 25  GLUCOSE 135* 154* 140* 111*  --  125* 130*  BUN 17 18 22* 24*  --  26* 28*  CREATININE 1.59* 1.47* 1.51* 1.43*  --  1.39* 1.32*  CALCIUM 8.4* 8.4* 8.6* 8.5*  --  8.2* 8.3*  MG 1.8  --  1.8 1.9 2.4  --  2.4  PHOS 2.6  --  1.4* 2.2* 3.6  --  3.4   GFR: Estimated Creatinine Clearance: 58.4 mL/min (A) (by C-G formula based on SCr of 1.32 mg/dL (H)). Recent  Labs  Lab 06/10/23 1447 06/10/23 1730 06/12/23 1701 06/12/23 1952 06/13/23 0417 06/13/23 1718 06/13/23 2257 06/13/23 2258 07/02/2023 0319  PROCALCITON 0.78  --   --   --   --   --   --   --   --   WBC  --    < >  --   --  9.7 9.2  --  9.1 8.9  LATICACIDVEN  --    < > 3.5* 3.5*  --  2.7* 1.6  --   --    < > = values in this interval not displayed.    Liver Function Tests: Recent Labs  Lab 06/12/23 0400 06/13/23 0417 06/13/23 1718 06/13/23 2258 07/03/2023 0319  AST 132* 137* 171* 140* 137*  ALT 32 26 31 25 25   ALKPHOS 183* 167* 185* 156* 160*  BILITOT 3.2* 2.8* 2.6* 2.5* 2.6*  PROT 6.3* 6.1* 6.6 5.9* 6.1*  ALBUMIN 2.2* 2.4* 2.6* 2.5* 2.6*   Recent Labs  Lab 06/10/23 1344 06/13/23 2258  LIPASE 69* 36  AMYLASE 194* 26*   Recent Labs  Lab 06/10/23 1447 06/13/23 0417  AMMONIA 218* 42*    ABG    Component Value Date/Time   PHART 7.48 (H) 06/23/2023 0643   PCO2ART 38 06/09/2023 0643   PO2ART 276 (H) 07/05/2023 0643   HCO3 28.3 (H) 06/15/2023 0643   ACIDBASEDEF 7.9 (H) 06/10/2023 1146   O2SAT 100 07/07/2023 0643     Coagulation Profile: Recent Labs  Lab 06/10/23 1600 06/13/23 1718 06/13/23 2258 06/15/2023 0319  INR 1.4* 1.2 1.3* 1.3*    Cardiac Enzymes: Recent Labs  Lab 06/10/23 1344 06/13/23 1718 06/13/23 2258 07/08/2023 0319  CKTOTAL 481* 175 178 175    HbA1C: HB A1C (BAYER DCA - WAIVED)  Date/Time Value  Ref Range Status  02/05/2023 03:03 PM 6.1 (H) 4.8 - 5.6 % Final    Comment:             Prediabetes: 5.7 - 6.4          Diabetes: >6.4          Glycemic control for adults with diabetes: <7.0   10/01/2018 04:30 PM 5.3 <7.0 % Final    Comment:                                          Diabetic Adult            <7.0                                       Healthy Adult        4.3 - 5.7                                                           (DCCT/NGSP) American Diabetes Association's Summary of Glycemic Recommendations for Adults with Diabetes: Hemoglobin A1c <7.0%. More stringent glycemic goals (A1c <6.0%) may further reduce complications at the cost of increased risk of hypoglycemia.    Hgb A1c MFr Bld  Date/Time Value Ref Range Status  06/10/2023 02:47 PM 5.3 4.8 - 5.6 %  Final    Comment:    (NOTE) Pre diabetes:          5.7%-6.4%  Diabetes:              >6.4%  Glycemic control for   <7.0% adults with diabetes   10/27/2022 05:04 PM 6.6 (H) 4.8 - 5.6 % Final    Comment:    (NOTE)         Prediabetes: 5.7 - 6.4         Diabetes: >6.4         Glycemic control for adults with diabetes: <7.0     CBG: Recent Labs  Lab 06/13/23 1625 06/13/23 1950 06/13/23 2339 07/02/2023 0345 06/12/2023 0804  GLUCAP 118* 119* 125* 109* 119*    Review of Systems:   Unable to assess due to AMS/sedation/intubation/critical illness  Past Medical History:  She,  has a past medical history of Alcohol abuse, Anxiety, Biliary calculi (12/07/2009), Cholelithiasis, Depression, Diet-controlled diabetes mellitus (HCC), GERD (gastroesophageal reflux disease), Hepatitis C, LVH (left ventricular hypertrophy), Morbid obesity (HCC), Palpitations, Pancreatic pseudocyst, Retained intrauterine contraceptive device (IUD) (01/20/2020), and Substance abuse (HCC).   Surgical History:   Past Surgical History:  Procedure Laterality Date   CHOLECYSTECTOMY     2011   COLONOSCOPY WITH PROPOFOL N/A 09/20/2022    Procedure: COLONOSCOPY WITH PROPOFOL;  Surgeon: Midge Minium, MD;  Location: [facility] ENDOSCOPY;  Service: Endoscopy;  Laterality: N/A;   ESOPHAGOGASTRODUODENOSCOPY N/A 09/29/2021   Procedure: ESOPHAGOGASTRODUODENOSCOPY (EGD);  Surgeon: Midge Minium, MD;  Location: [facility] ENDOSCOPY;  Service: Endoscopy;  Laterality: N/A;   ESOPHAGOGASTRODUODENOSCOPY (EGD) WITH PROPOFOL N/A 01/21/2018   Procedure: ESOPHAGOGASTRODUODENOSCOPY (EGD) WITH Biopsy;  Surgeon: Midge Minium, MD;  Location: [facility]ERY [facility];  Service: Endoscopy;  Laterality: N/A;   HYSTEROSCOPY WITH D & C N/A 01/20/2020   Procedure: DILATATION AND CURETTAGE /HYSTEROSCOPY;  Surgeon: Conard Novak, MD;  Location: [facility] ORS;  Service: Gynecology;  Laterality: N/A;   INTRAUTERINE DEVICE (IUD) INSERTION N/A 01/20/2020   Procedure: INTRAUTERINE DEVICE (IUD) INSERTION MIRENA IUD;  Surgeon: Conard Novak, MD;  Location: [facility] ORS;  Service: Gynecology;  Laterality: N/A;   IUD REMOVAL N/A 01/20/2020   Procedure: INTRAUTERINE DEVICE (IUD) REMOVAL;  Surgeon: Conard Novak, MD;  Location: [facility] ORS;  Service: Gynecology;  Laterality: N/A;   PORT-A-CATH REMOVAL  02/08/2022   PORTA CATH INSERTION N/A 01/01/2019   Procedure: PORTA CATH INSERTION;  Surgeon: Annice Needy, MD;  Location: [facility] INVASIVE CV LAB;  Service: Cardiovascular;  Laterality: N/A;   VASCULAR SURGERY       Social History:   reports that she has been smoking cigarettes. She has a 12 pack-year smoking history. She has never used smokeless tobacco. She reports current alcohol use. She reports current drug use. Drug: Marijuana.   Family History:  Her family history includes Breast cancer in her paternal grandmother; Diabetes in her father; Heart disease in her paternal grandfather; Hypertension in her father, maternal grandfather, and mother; Liver cancer in her maternal grandmother.   Allergies Allergies  Allergen Reactions   Penicillins Swelling    Tolerated ceftriaxone and  cefepime in past per [facility] and [facility] records     Home Medications  Prior to Admission medications   Medication Sig Start Date End Date Taking? Authorizing Provider  acetaminophen (TYLENOL) 500 MG tablet Take 1 tablet (500 mg total) by mouth 3 (three) times daily as needed for headache, fever or moderate pain (pain score 4-6). 04/18/23  Yes Rosezetta Schlatter  T, MD  citalopram (CELEXA) 20 MG tablet TAKE 1.5 TABLET(20 MG) BY MOUTH DAILY 02/20/23  Yes Johnson, Megan P, DO  famotidine (PEPCID) 20 MG tablet Take 20 mg by mouth 2 (two) times daily.   Yes [provider]  fluticasone furoate-vilanterol (BREO ELLIPTA) 200-25 MCG/ACT AEPB Inhale 1 puff into the lungs daily. 04/19/23  Yes Loyce Dys, MD  furosemide (LASIX) 40 MG tablet Take 1 tablet (40 mg total) by mouth 2 (two) times daily. 05/08/23  Yes Johnson, Megan P, DO  gabapentin (NEURONTIN) 300 MG capsule Take 1 capsule (300 mg total) by mouth at bedtime. Increase to twice daily if able to tolerate without any drowsiness 06/05/23  Yes Vanga, Loel Dubonnet, MD  Iron, Ferrous Sulfate, 325 (65 Fe) MG TABS Take 325 mg by mouth daily. 03/07/23  Yes Johnson, Megan P, DO  lactulose (CHRONULAC) 10 GM/15ML solution TAKE 15 MLS BY MOUTH DAILY 05/23/23  Yes Johnson, Megan P, DO  lidocaine (LIDODERM) 5 % Place 1 patch onto the skin daily. Remove & Discard patch within 12 hours or as directed by MD 04/28/22  Yes Johnson, Megan P, DO  MAGNESIUM PO Take 1 each by mouth in the morning and at bedtime. OTC patient is unsure of the dost   Yes [provider]  methocarbamol (ROBAXIN) 500 MG tablet Take 1 tablet (500 mg total) by mouth 2 (two) times daily. 05/08/23  Yes Johnson, Megan P, DO  metoprolol succinate (TOPROL-XL) 25 MG 24 hr tablet Take 1 tablet (25 mg total) by mouth daily. 02/20/23  Yes Johnson, Megan P, DO  Multiple Vitamin (MULTIVITAMIN WITH MINERALS) TABS tablet Take 1 tablet by mouth daily. 02/13/23  Yes Regalado, Belkys A, MD  pantoprazole  (PROTONIX) 40 MG tablet Take 1 tablet (40 mg total) by mouth 2 (two) times daily before a meal. 05/08/23 06/10/23 Yes Johnson, Megan P, DO  potassium chloride SA (KLOR-CON M) 20 MEQ tablet TAKE 1 TABLET(20 MEQ) BY MOUTH DAILY 03/20/23  Yes Johnson, Megan P, DO  folic acid (FOLVITE) 1 MG tablet Take 1 tablet (1 mg total) by mouth daily. Patient not taking: Reported on 06/10/2023 02/13/23   Hartley Barefoot A, MD  spironolactone (ALDACTONE) 50 MG tablet Take 1 tablet (50 mg total) by mouth daily. Patient not taking: Reported on 06/07/2023 05/08/23   Dorcas Carrow, DO     Critical care time: 30 minutes     Zada Girt, [facility]  Pulmonary/Critical Care Pager [phone] (please enter 7 digits) PCCM Consult Pager [phone] (please enter 7 digits)

## 2023-07-10 NOTE — Progress Notes (Signed)
 Daily Progress Note   Patient Name: Katie Stanley       Date: 06/26/2023 DOB: Nov 19, 1985  Age: 38 y.o. MRN#: [id number] Attending Physician: [name], MD Primary Care Physician: Dorcas Carrow, DO Admit Date: 06/10/2023  Reason for Consultation/Follow-up: Establishing goals of care  Subjective: Notes reviewed. [facility] following for possible organ donation. Initial plans for OR at 10:00am.   In to see patient. She is resting in bed on ventilator support; no distress noted. No family at bedside.   Current plans in place for patient to go to OR at 4:00pm today.   Length of Stay: 4  Current Medications: Scheduled Meds:  . Chlorhexidine Gluconate Cloth  6 each Topical Q0600  . fentaNYL (SUBLIMAZE) injection  50 mcg Intravenous Once  . insulin aspart  0-9 Units Subcutaneous Q4H  . metoprolol tartrate  12.5 mg Per Tube Q6H  . multivitamin  15 mL Per Tube Daily  . mouth rinse  15 mL Mouth Rinse Q2H  . polyethylene glycol  17 g Per Tube Daily    Continuous Infusions: . ceFEPime (MAXIPIME) IV 2 g (06/18/2023 0911)  . fentaNYL infusion INTRAVENOUS 75 mcg/hr (07/03/2023 0640)  . levETIRAcetam Stopped (06/12/2023 0016)  . midazolam    . norepinephrine (LEVOPHED) Adult infusion Stopped (06/17/2023 0428)  . norepinephrine (LEVOPHED) Adult infusion      PRN Meds: fentaNYL, fentaNYL (SUBLIMAZE) injection, fentaNYL (SUBLIMAZE) injection, mouth rinse, polyethylene glycol  Physical Exam Constitutional:      Comments: Eyes closed.   HENT:     Head: Normocephalic.  Pulmonary:     Comments: On ventilator Skin:    General: Skin is warm and dry.             Vital Signs: BP 101/70   Pulse (!) 112   Temp 98.6 F (37 C)   Resp 18   Ht 5\' 3"  (1.6 m)   Wt 80 kg   LMP  (LMP Unknown)    SpO2 99%   BMI 31.24 kg/m  SpO2: SpO2: 99 % O2 Device: O2 Device: Ventilator O2 Flow Rate:    Intake/output summary:  Intake/Output Summary (Last 24 hours) at 06/29/2023 1006 Last data filed at 06/17/2023 0640 Gross per 24 hour  Intake 2491.64 ml  Output 1332 ml  Net 1159.64 ml   LBM:  Last BM Date : 07/08/2023 Baseline Weight: Weight: 77.1 kg Most recent weight: Weight: 80 kg   Patient Active Problem List   Diagnosis Date Noted  . Cardiogenic shock (HCC) 06/12/2023  . Anoxic brain injury (HCC) 06/12/2023  . Perceptual disturbances and seizures concurrent with and due to alcohol withdrawal (HCC) 06/12/2023  . Acute hypoxic respiratory failure (HCC) 06/10/2023  . Cardiac arrest (HCC) 06/10/2023  . Lactic acidosis 06/10/2023  . Hypokalemia 06/10/2023  . Alcoholic hepatitis with ascites 04/09/2023  . Abnormal LFTs 04/09/2023  . Alcohol use disorder 02/11/2023  . Disorder of electrolytes 02/09/2023  . Polysubstance abuse (HCC) 02/09/2023  . Tobacco abuse 02/09/2023  . Depression with anxiety 02/09/2023  . Obesity (BMI 30-39.9) 02/09/2023  . GERD (gastroesophageal reflux disease) 02/09/2023  . HTN (hypertension) 02/09/2023  . Abnormal liver function 02/09/2023  . Diabetes mellitus type 2 with complications (HCC) 02/05/2023  . Polyp of transverse colon 09/20/2022  . Hepatic steatosis 04/28/2022  . Vitamin D deficiency 04/28/2022  . History of cardiac arrest 04/28/2022  . Primary hypertension 05/16/2021  . Alcohol abuse 12/31/2020  . Paroxysmal tachycardia (HCC) 07/22/2019  . Morbid obesity (HCC) 04/29/2018  . Gastroesophageal reflux disease 12/25/2017  . 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HCC) 05/01/2016  . Anxiety 02/25/2015  . Depression, recurrent (HCC) 02/25/2015  . Chronic hepatitis C virus infection (HCC) 07/07/2009    Palliative Care Assessment & Plan    Recommendations/Plan: Possible organ procurement at 4:00pm today.   Code Status:    Code Status Orders   (From admission, onward)           Start     Ordered   06/10/23 1456  Full code  Continuous       Question:  By:  Answer:  Other   06/10/23 1500           Code Status History     Date Active Date Inactive Code Status Order ID Comments User Context   06/10/2023 1314 06/10/2023 1500 Full Code [id number]  Ezequiel Essex, NP ED   04/09/2023 2116 04/19/2023 1704 Full Code [id number]  Lorretta Harp, MD ED   02/09/2023 2234 02/12/2023 1740 Full Code [id number]  Lorretta Harp, MD ED   10/27/2022 1525 11/01/2022 1951 Full Code [id number]  Floydene Flock, MD ED   06/21/2022 1718 06/24/2022 1804 Full Code [id number]  Willeen Niece, MD ED   01/20/2020 1457 01/20/2020 2322 Full Code [id number]  Conard Novak, MD Inpatient      Care plan was discussed with CCM team  Thank you for allowing the Palliative Medicine Team to assist in the care of this patient.   Morton Stall, NP  Please contact Palliative Medicine Team phone at [phone] for questions and concerns.

## 2023-07-10 DEATH — deceased

## 2023-07-19 ENCOUNTER — Ambulatory Visit: Payer: Medicaid Other | Admitting: Family Medicine

## 2023-08-27 ENCOUNTER — Other Ambulatory Visit: Payer: Self-pay | Admitting: Family Medicine
